# Patient Record
Sex: Female | Born: 1960 | Race: White | Hispanic: No | Smoking: Former smoker
Health system: Southern US, Community
[De-identification: ages and names within clinical notes are randomized; demographics above are authoritative.]

## PROBLEM LIST (undated history)

## (undated) DIAGNOSIS — T7840XA Allergy, unspecified, initial encounter: Secondary | ICD-10-CM

## (undated) DIAGNOSIS — J449 Chronic obstructive pulmonary disease, unspecified: Secondary | ICD-10-CM

## (undated) DIAGNOSIS — I10 Essential (primary) hypertension: Secondary | ICD-10-CM

## (undated) DIAGNOSIS — H409 Unspecified glaucoma: Secondary | ICD-10-CM

## (undated) DIAGNOSIS — N289 Disorder of kidney and ureter, unspecified: Secondary | ICD-10-CM

## (undated) DIAGNOSIS — E079 Disorder of thyroid, unspecified: Secondary | ICD-10-CM

## (undated) DIAGNOSIS — R52 Pain, unspecified: Secondary | ICD-10-CM

## (undated) DIAGNOSIS — E785 Hyperlipidemia, unspecified: Secondary | ICD-10-CM

## (undated) DIAGNOSIS — Z22322 Carrier or suspected carrier of Methicillin resistant Staphylococcus aureus: Secondary | ICD-10-CM

## (undated) HISTORY — DX: Allergy, unspecified, initial encounter: T78.40XA

## (undated) HISTORY — PX: OTHER SURGICAL HISTORY: SHX169

## (undated) HISTORY — PX: CHOLECYSTECTOMY: SHX55

## (undated) HISTORY — DX: Hyperlipidemia, unspecified: E78.5

## (undated) HISTORY — PX: ABDOMINAL HYSTERECTOMY: SHX81

## (undated) HISTORY — DX: Disorder of thyroid, unspecified: E07.9

## (undated) HISTORY — DX: Pain, unspecified: R52

---

## 1992-12-22 HISTORY — PX: OTHER SURGICAL HISTORY: SHX169

## 2001-06-21 ENCOUNTER — Encounter: Admission: RE | Admit: 2001-06-21 | Discharge: 2001-07-08 | Payer: Self-pay | Admitting: Internal Medicine

## 2008-12-12 ENCOUNTER — Ambulatory Visit: Payer: Self-pay | Admitting: Family Medicine

## 2008-12-12 DIAGNOSIS — Z87891 Personal history of nicotine dependence: Secondary | ICD-10-CM | POA: Insufficient documentation

## 2008-12-12 DIAGNOSIS — G589 Mononeuropathy, unspecified: Secondary | ICD-10-CM | POA: Insufficient documentation

## 2008-12-12 DIAGNOSIS — F329 Major depressive disorder, single episode, unspecified: Secondary | ICD-10-CM | POA: Insufficient documentation

## 2008-12-12 DIAGNOSIS — F3289 Other specified depressive episodes: Secondary | ICD-10-CM | POA: Insufficient documentation

## 2008-12-12 DIAGNOSIS — F332 Major depressive disorder, recurrent severe without psychotic features: Secondary | ICD-10-CM | POA: Insufficient documentation

## 2008-12-12 DIAGNOSIS — E782 Mixed hyperlipidemia: Secondary | ICD-10-CM | POA: Insufficient documentation

## 2008-12-12 DIAGNOSIS — F17200 Nicotine dependence, unspecified, uncomplicated: Secondary | ICD-10-CM

## 2008-12-12 DIAGNOSIS — E876 Hypokalemia: Secondary | ICD-10-CM | POA: Insufficient documentation

## 2008-12-12 DIAGNOSIS — J45909 Unspecified asthma, uncomplicated: Secondary | ICD-10-CM | POA: Insufficient documentation

## 2008-12-12 DIAGNOSIS — M48061 Spinal stenosis, lumbar region without neurogenic claudication: Secondary | ICD-10-CM | POA: Insufficient documentation

## 2008-12-12 DIAGNOSIS — F418 Other specified anxiety disorders: Secondary | ICD-10-CM | POA: Insufficient documentation

## 2008-12-12 DIAGNOSIS — E785 Hyperlipidemia, unspecified: Secondary | ICD-10-CM | POA: Insufficient documentation

## 2008-12-12 DIAGNOSIS — J449 Chronic obstructive pulmonary disease, unspecified: Secondary | ICD-10-CM | POA: Insufficient documentation

## 2009-01-12 ENCOUNTER — Ambulatory Visit: Payer: Self-pay | Admitting: Physical Medicine & Rehabilitation

## 2009-01-13 ENCOUNTER — Encounter
Admission: RE | Admit: 2009-01-13 | Discharge: 2009-01-13 | Payer: Self-pay | Admitting: Physical Medicine & Rehabilitation

## 2009-01-17 ENCOUNTER — Encounter
Admission: RE | Admit: 2009-01-17 | Discharge: 2009-02-15 | Payer: Self-pay | Admitting: Physical Medicine & Rehabilitation

## 2009-01-18 ENCOUNTER — Ambulatory Visit: Payer: Self-pay | Admitting: Physical Medicine & Rehabilitation

## 2009-02-05 ENCOUNTER — Encounter: Admission: RE | Admit: 2009-02-05 | Discharge: 2009-02-05 | Payer: Self-pay | Admitting: Family Medicine

## 2009-02-05 ENCOUNTER — Ambulatory Visit: Payer: Self-pay | Admitting: Family Medicine

## 2009-02-05 DIAGNOSIS — M25579 Pain in unspecified ankle and joints of unspecified foot: Secondary | ICD-10-CM | POA: Insufficient documentation

## 2009-02-14 ENCOUNTER — Telehealth: Payer: Self-pay | Admitting: Family Medicine

## 2009-02-15 ENCOUNTER — Ambulatory Visit: Payer: Self-pay | Admitting: Physical Medicine & Rehabilitation

## 2009-02-20 ENCOUNTER — Ambulatory Visit: Payer: Self-pay | Admitting: Family Medicine

## 2009-02-20 DIAGNOSIS — J329 Chronic sinusitis, unspecified: Secondary | ICD-10-CM | POA: Insufficient documentation

## 2009-02-20 DIAGNOSIS — E039 Hypothyroidism, unspecified: Secondary | ICD-10-CM | POA: Insufficient documentation

## 2009-02-21 ENCOUNTER — Telehealth: Payer: Self-pay | Admitting: Family Medicine

## 2009-02-22 LAB — CONVERTED CEMR LAB
Chloride: 97 meq/L (ref 96–112)
Creatinine, Ser: 0.92 mg/dL (ref 0.40–1.20)
LDL Cholesterol: 39 mg/dL (ref 0–99)
Total CHOL/HDL Ratio: 1.8
Total Protein: 6.5 g/dL (ref 6.0–8.3)
Vitamin B-12: 420 pg/mL (ref 211–911)

## 2009-02-26 ENCOUNTER — Encounter: Payer: Self-pay | Admitting: Family Medicine

## 2009-03-05 ENCOUNTER — Telehealth (INDEPENDENT_AMBULATORY_CARE_PROVIDER_SITE_OTHER): Payer: Self-pay | Admitting: *Deleted

## 2009-03-26 ENCOUNTER — Ambulatory Visit: Payer: Self-pay | Admitting: Family Medicine

## 2009-03-26 DIAGNOSIS — I10 Essential (primary) hypertension: Secondary | ICD-10-CM | POA: Insufficient documentation

## 2009-03-26 DIAGNOSIS — R609 Edema, unspecified: Secondary | ICD-10-CM | POA: Insufficient documentation

## 2009-03-27 LAB — CONVERTED CEMR LAB
BUN: 15 mg/dL (ref 6–23)
CO2: 29 meq/L (ref 19–32)
Calcium: 9.5 mg/dL (ref 8.4–10.5)
Chloride: 100 meq/L (ref 96–112)
Creatinine, Ser: 0.99 mg/dL (ref 0.40–1.20)
Glucose, Bld: 123 mg/dL — ABNORMAL HIGH (ref 70–99)
Sodium: 140 meq/L (ref 135–145)

## 2009-04-17 ENCOUNTER — Encounter: Payer: Self-pay | Admitting: Family Medicine

## 2009-05-03 ENCOUNTER — Ambulatory Visit: Payer: Self-pay | Admitting: Family Medicine

## 2009-05-03 LAB — CONVERTED CEMR LAB
Bilirubin Urine: NEGATIVE
Blood in Urine, dipstick: NEGATIVE
Ketones, urine, test strip: NEGATIVE
Nitrite: NEGATIVE
Protein, U semiquant: NEGATIVE
Specific Gravity, Urine: 1.01
Urobilinogen, UA: 0.2
WBC Urine, dipstick: NEGATIVE
pH: 5

## 2009-05-07 ENCOUNTER — Telehealth: Payer: Self-pay | Admitting: Family Medicine

## 2009-05-15 ENCOUNTER — Telehealth: Payer: Self-pay | Admitting: Family Medicine

## 2009-06-01 ENCOUNTER — Encounter: Payer: Self-pay | Admitting: Family Medicine

## 2009-06-22 ENCOUNTER — Ambulatory Visit: Payer: Self-pay | Admitting: Family Medicine

## 2009-07-05 ENCOUNTER — Encounter: Payer: Self-pay | Admitting: Family Medicine

## 2009-07-06 ENCOUNTER — Encounter: Payer: Self-pay | Admitting: Family Medicine

## 2009-07-06 ENCOUNTER — Telehealth: Payer: Self-pay | Admitting: Family Medicine

## 2009-07-13 ENCOUNTER — Ambulatory Visit: Payer: Self-pay | Admitting: Family Medicine

## 2009-07-13 DIAGNOSIS — E559 Vitamin D deficiency, unspecified: Secondary | ICD-10-CM | POA: Insufficient documentation

## 2009-07-14 ENCOUNTER — Encounter: Payer: Self-pay | Admitting: Family Medicine

## 2009-07-16 LAB — CONVERTED CEMR LAB
UIBC: 253 ug/dL
Vit D, 25-Hydroxy: 24 ng/mL — ABNORMAL LOW (ref 30–89)
Vitamin B-12: 310 pg/mL (ref 211–911)

## 2009-07-19 ENCOUNTER — Encounter: Payer: Self-pay | Admitting: Family Medicine

## 2009-08-17 ENCOUNTER — Ambulatory Visit: Payer: Self-pay | Admitting: Family Medicine

## 2009-08-17 DIAGNOSIS — J309 Allergic rhinitis, unspecified: Secondary | ICD-10-CM | POA: Insufficient documentation

## 2009-08-20 ENCOUNTER — Telehealth: Payer: Self-pay | Admitting: Family Medicine

## 2009-08-22 ENCOUNTER — Telehealth: Payer: Self-pay | Admitting: Family Medicine

## 2009-09-06 ENCOUNTER — Ambulatory Visit: Payer: Self-pay | Admitting: Family Medicine

## 2009-09-06 LAB — CONVERTED CEMR LAB: Rapid Strep: NEGATIVE

## 2009-09-10 ENCOUNTER — Telehealth: Payer: Self-pay | Admitting: Family Medicine

## 2009-09-25 ENCOUNTER — Ambulatory Visit: Payer: Self-pay | Admitting: Family Medicine

## 2009-09-25 ENCOUNTER — Encounter: Admission: RE | Admit: 2009-09-25 | Discharge: 2009-09-25 | Payer: Self-pay | Admitting: Family Medicine

## 2009-09-28 ENCOUNTER — Telehealth: Payer: Self-pay | Admitting: Family Medicine

## 2010-01-23 ENCOUNTER — Telehealth: Payer: Self-pay | Admitting: Family Medicine

## 2010-02-14 ENCOUNTER — Encounter: Payer: Self-pay | Admitting: Family Medicine

## 2010-02-14 ENCOUNTER — Telehealth: Payer: Self-pay | Admitting: Family Medicine

## 2010-03-18 ENCOUNTER — Ambulatory Visit: Payer: Self-pay | Admitting: Family Medicine

## 2010-03-28 ENCOUNTER — Encounter: Payer: Self-pay | Admitting: Family Medicine

## 2010-04-30 ENCOUNTER — Encounter: Payer: Self-pay | Admitting: Family Medicine

## 2010-08-29 ENCOUNTER — Ambulatory Visit: Payer: Self-pay | Admitting: Family Medicine

## 2010-08-29 ENCOUNTER — Telehealth: Payer: Self-pay | Admitting: Family Medicine

## 2010-10-14 ENCOUNTER — Ambulatory Visit: Payer: Self-pay | Admitting: Family Medicine

## 2010-10-15 ENCOUNTER — Encounter (INDEPENDENT_AMBULATORY_CARE_PROVIDER_SITE_OTHER): Payer: Self-pay | Admitting: *Deleted

## 2010-10-17 ENCOUNTER — Encounter: Payer: Self-pay | Admitting: Family Medicine

## 2010-10-24 ENCOUNTER — Telehealth: Payer: Self-pay | Admitting: Family Medicine

## 2010-11-01 ENCOUNTER — Encounter: Payer: Self-pay | Admitting: Family Medicine

## 2010-12-17 ENCOUNTER — Ambulatory Visit: Payer: Self-pay | Admitting: Family Medicine

## 2010-12-19 ENCOUNTER — Encounter: Payer: Self-pay | Admitting: Family Medicine

## 2010-12-20 ENCOUNTER — Ambulatory Visit
Admission: RE | Admit: 2010-12-20 | Discharge: 2010-12-20 | Payer: Self-pay | Source: Home / Self Care | Attending: Family Medicine | Admitting: Family Medicine

## 2010-12-20 ENCOUNTER — Telehealth: Payer: Self-pay | Admitting: Family Medicine

## 2010-12-25 ENCOUNTER — Telehealth: Payer: Self-pay | Admitting: Family Medicine

## 2010-12-27 ENCOUNTER — Ambulatory Visit
Admission: RE | Admit: 2010-12-27 | Discharge: 2010-12-27 | Payer: Self-pay | Source: Home / Self Care | Attending: Family Medicine | Admitting: Family Medicine

## 2011-01-04 ENCOUNTER — Encounter: Payer: Self-pay | Admitting: Family Medicine

## 2011-01-07 ENCOUNTER — Ambulatory Visit
Admission: RE | Admit: 2011-01-07 | Discharge: 2011-01-07 | Payer: Self-pay | Source: Home / Self Care | Attending: Family Medicine | Admitting: Family Medicine

## 2011-01-13 ENCOUNTER — Encounter: Payer: Self-pay | Admitting: Family Medicine

## 2011-01-21 NOTE — Progress Notes (Signed)
Summary: refills  Phone Note Call from Patient   Caller: Patient Details for Reason: refill Summary of Call: patient would like a refill of cymbalta Initial call taken by: Kern Reap CMA Duncan Dull),  February 14, 2010 11:20 AM  Follow-up for Phone Call        patient is aware Follow-up by: Kern Reap CMA Duncan Dull),  February 14, 2010 11:35 AM    Prescriptions: CYMBALTA 30 MG CPEP (DULOXETINE HCL) Take 1 tablet by mouth once a day  #30 x 0   Entered by:   Kern Reap CMA (AAMA)   Authorized by:   Nani Gasser MD   Signed by:   Kern Reap CMA (AAMA) on 02/14/2010   Method used:   Electronically to        [facility]* (retail)       [address] [place], [place]  [address]       Ph: [phone]       Fax: [phone]   RxID:   [id number]

## 2011-01-21 NOTE — Progress Notes (Signed)
Summary: cymbalta refill  Phone Note Call from Patient   Caller: Patient Call For: Nani Gasser MD Reason for Call: Refill Medication Summary of Call: patient would like a rx for cymbalta 60mg  is this okay to fill? Initial call taken by: Kern Reap CMA Duncan Dull),  February 14, 2010 12:59 PM  Follow-up for Phone Call        OK to increase to 60mg  but also needs to f/u in the next month.  Follow-up by: Nani Gasser MD,  February 14, 2010 1:05 PM  Additional Follow-up for Phone Call Additional follow up Details #1::        Phone Call Completed Additional Follow-up by: Kern Reap CMA Duncan Dull),  February 14, 2010 2:11 PM    New/Updated Medications: CYMBALTA 60 MG CPEP (DULOXETINE HCL) Take 1 tablet by mouth once a day Prescriptions: CYMBALTA 60 MG CPEP (DULOXETINE HCL) Take 1 tablet by mouth once a day  #30 x 0   Entered and Authorized by:   Nani Gasser MD   Signed by:   Nani Gasser MD on 02/14/2010   Method used:   Electronically to        [facility]* (retail)       [address] [place], [place]  [address]       Ph: [phone]       Fax: [phone]   RxID:   [id number]

## 2011-01-21 NOTE — Progress Notes (Signed)
Summary: Meds  Phone Note Call from Patient Call back at Home Phone [phone]   Summary of Call: pt states insurance changed and now they will cover Cymbalta and Abilify also will cover Omweprazole 2 pills a day. Call to [facility] in [place] Initial call taken by: Kathlene November,  January 23, 2010 3:54 PM  Follow-up for Phone Call        OK, cut citalopram in half for one week then stop and can start the cymbalta. Can stop the seroquel anc change to the abilify.  Follow-up by: Nani Gasser MD,  January 23, 2010 7:42 PM    New/Updated Medications: CYMBALTA 30 MG CPEP (DULOXETINE HCL) Take 1 tablet by mouth once a day OMEPRAZOLE 40 MG CPDR (OMEPRAZOLE) Take 1 tablet by mouth once a day up to two times a day Prescriptions: CYMBALTA 30 MG CPEP (DULOXETINE HCL) Take 1 tablet by mouth once a day  #30 x 0   Entered and Authorized by:   Nani Gasser MD   Signed by:   Nani Gasser MD on 01/23/2010   Method used:   Electronically to        [facility]* (retail)       [address] [place], [place]  [address]       Ph: [phone]       Fax: [phone]   RxID:   [id number] ABILIFY 10 MG TABS (ARIPIPRAZOLE) Take 1 tablet by mouth once a day  #90 x 1   Entered and Authorized by:   Nani Gasser MD   Signed by:   Nani Gasser MD on 01/23/2010   Method used:   Electronically to        [facility]* (retail)       [address] [place], [place]  [address]       Ph: [phone]       Fax: [phone]   RxID:   [id number] OMEPRAZOLE 40 MG CPDR (OMEPRAZOLE) Take 1 tablet by mouth once a day up to two times a day  #60 x 3   Entered and Authorized by:   Nani Gasser MD   Signed by:   Nani Gasser MD on 01/23/2010   Method used:   Electronically to        [facility]* (retail)       [address] [place], [place]  [address]       Ph: [phone]       Fax: [phone]   RxID:   [id number]

## 2011-01-21 NOTE — Assessment & Plan Note (Signed)
Summary: med recheck   Vital Signs:  Patient profile:   50 year old female Height:      63 inches Weight:      232 pounds BMI:     41.25 O2 Sat:      95 % on Room air Pulse rate:   102 / minute BP sitting:   137 / 80  (left arm) Cuff size:   large  Vitals Entered By: Kathlene November (March 18, 2010 9:01 AM)  O2 Flow:  Room air CC: recheck med- Cymbalta- pt states working good but feels worked better in combo with the Abilify but had to stop it due to muscle jerks   Primary Care Provider:  Nani Gasser MD  CC:  recheck med- Cymbalta- pt states working good but feels worked better in combo with the Abilify but had to stop it due to muscle jerks.  History of Present Illness: recheck med- Cymbalta- pt states working good but feels worked better in combo with the Abilify but had to stop it due to muscle jerks.Doesn't feel her mood is well controlled right now and she is not sleeping well. Feels more agitated overall.   Her spring allergies have been bothering her and feels her sinuses are relaly irritated. Still lives with her mother which exposes her to mutliple allergans but unfortunatly the situation won't change for right now.  She is also interested in restarting Chantix.   Current Medications (verified): 1)  Spiriva Handihaler 18 Mcg Caps (Tiotropium Bromide Monohydrate) .... One Inhalation Once A Day 2)  Fexofenadine Hcl 180 Mg Tabs (Fexofenadine Hcl) .Marland Kitchen.. 1 Tab By Mouth Daily 3)  Alprazolam 0.5 Mg Tabs (Alprazolam) .... One Tablet Three Times A Day As Needed 4)  Gabapentin 600 Mg Tabs (Gabapentin) .... One Tablet Once Daily 5)  Zanaflex 4 Mg Tabs (Tizanidine Hcl) .... One Tablet Three Times A Day As Needed 6)  Cymbalta 60 Mg Cpep (Duloxetine Hcl) .... Take 1 Tablet By Mouth Once A Day 7)  Meloxicam 15 Mg Tabs (Meloxicam) .Marland Kitchen.. 1 By Mouth Once Daily 8)  Crestor 20 Mg Tabs (Rosuvastatin Calcium) .... Take One Tablet By Mouth Once A Day 9)  Centrum  Tabs (Multiple  Vitamins-Minerals) 10)  Vitamin B-12 500 Mcg Tabs (Cyanocobalamin) .... One Tablet Two Times A Day 11)  Cvs Iron 325 (65 Fe) Mg Tabs (Ferrous Sulfate) .... One Tablet Two Times A Day 12)  Cvs Vitamin E 400 Unit Caps (Vitamin E) 13)  Aspartate Mg & K 90-90 Mg Caps (Mag Aspart-Potassium Aspart) 14)  Calcium-Magnesium-Vitamin D 200-100-33.3 Mg-Mg-Unit Caps (Calcium-Magnesium-Vitamin D) .... Two Tablets Two Times A Day 15)  Vitamin C Cr 500 Mg Cr-Tabs (Ascorbic Acid) 16)  Zolpidem Tartrate 10 Mg Tabs (Zolpidem Tartrate) .... As Needed 17)  Levothroid 88 Mcg Tabs (Levothyroxine Sodium) .... Take 1 Tablet By Mouth Once A Day 18)  Furosemide 20 Mg Tabs (Furosemide) .... 2 Tabs By Mouth Daily 19)  Methocarbamol 500 Mg Tabs (Methocarbamol) .... Take 1 Tablet By Mouth Three Times A Day 20)  Potassium Chloride Cr 10 Meq Cr-Caps (Potassium Chloride) .... Take 1 Tablet By Mouth Once A Day 21)  Vitamin D 37106 Unit Caps (Ergocalciferol) .... Take 1 Tablet By Mouth Once A Week For 12 Weeks. 22)  Symbicort 160-4.5 Mcg/act Aero (Budesonide-Formoterol Fumarate) .... Use 2 Puffs in The Mornings and in The Evenings 23)  Omeprazole 20 Mg Tbec (Omeprazole) .... Take Oen Tablet By Mouth Twice A Day 24)  Tramadol Hcl 50 Mg  Tabs (Tramadol Hcl) .... One By Mouth Eery 4-6 Hours As Needed As Needed Back Pain 25)  Lisinopril 10 Mg Tabs (Lisinopril) .... Take 1 Tablet By Mouth Once A Day 26)  Proair Hfa 108 (90 Base) Mcg/act Aers (Albuterol Sulfate) .... 2 Puffs Q 4 Hrs Prn  Allergies (verified): 1)  ! Abilify (Aripiprazole) 2)  Prozac (Fluoxetine Hcl) 3)  Paxil (Paroxetine Hcl)  Comments:  Nurse/Medical Assistant: The patient's medications and allergies were reviewed with the patient and were updated in the Medication and Allergy Lists. Kathlene November (March 18, 2010 9:04 AM)  Family History: Reviewed history from 07/13/2009 and no changes required. Brother with DM, Bipolar, heavy smoker, Throat cancer GM with  DM Mother and brothers with depression and mood d/o.   Mother with hi cholesteorl, HTN Father with HTN.   Social History: Brother is Laretta Alstrom.   Unemployed, on disability.  HS degree.  Divorced.  ONe daughter. Lives with her mother currently.   Current Smoker Drug use-no Regular exercise-no  Physical Exam  General:  Well-developed,well-nourished,in no acute distress; alert,appropriate and cooperative throughout examination Lungs:  Normal respiratory effort, chest expands symmetrically. Lungs are clear to auscultation, no crackles or wheezes. Overall dec BS but no wheeze.  Heart:  Normal rate and regular rhythm. S1 and S2 normal without gallop, murmur, click, rub or other extra sounds. Skin:  no rashes.   Psych:  Cognition and judgment appear intact. Alert and cooperative with normal attention span and concentration. No apparent delusions, illusions, hallucinations   Impression & Recommendations:  Problem # 1:  DEPRESSION (ICD-311) Stopped th4 abilify secondary to involuntary muscle movement. Did well on seroqul in the past so will restart this now. We had stopped it becaseu fo cost issues but she has new insurrance this year. Will restart toe 50mg  xR and have her f/u in one month to make sure she is doing well and see if needs  dose adjustement. This iwll likely help with her sleep as well.  Her updated medication list for this problem includes:    Alprazolam 0.5 Mg Tabs (Alprazolam) ..... One tablet three times a day as needed    Cymbalta 60 Mg Cpep (Duloxetine hcl) .Marland Kitchen... Take 1 tablet by mouth once a day  Problem # 2:  FATIGUE (ICD-780.79) Will rule out anmeia. SHe has also not been sleeping well so will refill her Ambien. She thinkis she is on the 10 mg dose.  Will also recheck her B12  and her vitamin D which has  been low in the past.  Orders: T-CBC No Diff ([id number]) T-Vitamin B12 ([id number])  Problem # 3:  ALLERGIC RHINITIS (ICD-477.9) Given sample of astepro to  add her her oral antihistamine and her nasal steoid to see if provide extra wsinus sxs relief.   Her updated medication list for this problem includes:    Fexofenadine Hcl 180 Mg Tabs (Fexofenadine hcl) .Marland Kitchen... 1 tab by mouth daily  Problem # 4:  SMOKER (ICD-305.1) Discussed that once stable on the seeroquel will consider restarting Chantix and follow very closely since this can alter mood.    Complete Medication List: 1)  Spiriva Handihaler 18 Mcg Caps (Tiotropium bromide monohydrate) .... One inhalation once a day 2)  Fexofenadine Hcl 180 Mg Tabs (Fexofenadine hcl) .Marland Kitchen.. 1 tab by mouth daily 3)  Alprazolam 0.5 Mg Tabs (Alprazolam) .... One tablet three times a day as needed 4)  Gabapentin 600 Mg Tabs (Gabapentin) .... One tablet once daily 5)  Zanaflex 4 Mg  Tabs (Tizanidine hcl) .... One tablet three times a day as needed 6)  Cymbalta 60 Mg Cpep (Duloxetine hcl) .... Take 1 tablet by mouth once a day 7)  Meloxicam 15 Mg Tabs (Meloxicam) .Marland Kitchen.. 1 by mouth once daily 8)  Crestor 20 Mg Tabs (Rosuvastatin calcium) .... Take one tablet by mouth once a day 9)  Centrum Tabs (Multiple vitamins-minerals) 10)  Vitamin B-12 500 Mcg Tabs (Cyanocobalamin) .... One tablet two times a day 11)  Cvs Iron 325 (65 Fe) Mg Tabs (Ferrous sulfate) .... One tablet two times a day 12)  Cvs Vitamin E 400 Unit Caps (Vitamin e) 13)  Aspartate Mg & K 90-90 Mg Caps (Mag aspart-potassium aspart) 14)  Calcium-magnesium-vitamin D 200-100-33.3 Mg-mg-unit Caps (Calcium-magnesium-vitamin d) .... Two tablets two times a day 15)  Vitamin C Cr 500 Mg Cr-tabs (Ascorbic acid) 16)  Zolpidem Tartrate 10 Mg Tabs (Zolpidem tartrate) .... Take 1 tablet by mouth once a day at bedtime 17)  Levothroid 88 Mcg Tabs (Levothyroxine sodium) .... Take 1 tablet by mouth once a day 18)  Furosemide 20 Mg Tabs (Furosemide) .... 2 tabs by mouth daily 19)  Methocarbamol 500 Mg Tabs (Methocarbamol) .... Take 1 tablet by mouth three times a day 20)   Potassium Chloride Cr 10 Meq Cr-caps (Potassium chloride) .... Take 1 tablet by mouth once a day 21)  Vitamin D 56433 Unit Caps (Ergocalciferol) .... Take 1 tablet by mouth once a week for 12 weeks. 22)  Symbicort 160-4.5 Mcg/act Aero (Budesonide-formoterol fumarate) .... Use 2 puffs in the mornings and in the evenings 23)  Omeprazole 20 Mg Tbec (Omeprazole) .... Take oen tablet by mouth twice a day 24)  Tramadol Hcl 50 Mg Tabs (Tramadol hcl) .... One by mouth eery 4-6 hours as needed as needed back pain 25)  Lisinopril 10 Mg Tabs (Lisinopril) .... Take 1 tablet by mouth once a day 26)  Proair Hfa 108 (90 Base) Mcg/act Aers (Albuterol sulfate) .... 2 puffs q 4 hrs prn 27)  Seroquel Xr 50 Mg Xr24h-tab (Quetiapine fumarate) .... Take 1 tablet by mouth once a day in the evening.  Other Orders: T-Comprehensive Metabolic Panel [id number]) T-Lipid Profile [id number]) T-TSH [phone]) T-Vitamin D (25-Hydroxy) [id number]) Prescriptions: CRESTOR 20 MG TABS (ROSUVASTATIN CALCIUM) take one tablet by mouth once a day  #30 x 6   Entered and Authorized by:   Nani Gasser MD   Signed by:   Nani Gasser MD on 03/18/2010   Method used:   Electronically to        [facility]* (retail)       [address] [place], [place]  [address]       Ph: [phone]       Fax: [phone]   RxID:   [id number] ZOLPIDEM TARTRATE 10 MG TABS (ZOLPIDEM TARTRATE) Take 1 tablet by mouth once a day at bedtime  #30 x 1   Entered and Authorized by:   Nani Gasser MD   Signed by:   Nani Gasser MD on 03/18/2010   Method used:   Printed then faxed to ...       [facility]* (retail)       [address] [place], [place]  [address]       Ph: [phone]       Fax: [phone]   RxID:   [id number] SEROQUEL XR 50 MG XR24H-TAB (QUETIAPINE FUMARATE) Take 1  tablet by mouth once a day in the evening.  #30 x 2   Entered and Authorized by:   Nani Gasser MD    Signed by:   Nani Gasser MD on 03/18/2010   Method used:   Electronically to        [facility]* (retail)       [address] [place], [place]  [address]       Ph: [phone]       Fax: [phone]   RxID:   [id number]

## 2011-01-21 NOTE — Procedures (Signed)
Summary: Oximetry Order/[facility]  Oximetry Order/[facility]   Imported By: Lanelle Bal 10/25/2010 11:23:07  _____________________________________________________________________  External Attachment:    Type:   Image     Comment:   External Document

## 2011-01-21 NOTE — Assessment & Plan Note (Signed)
Summary: COPD, oxgen, HTN, etc   Vital Signs:  Patient profile:   50 year old female Height:      63 inches Weight:      245 pounds O2 Sat:      92 % on Room air Pulse rate:   94 / minute BP sitting:   141 / 74  (right arm) Cuff size:   large  Vitals Entered By: Avon Gully CMA, Duncan Dull) (October 14, 2010 2:46 PM)  O2 Flow:  Room air CC: Oxygen testing   Primary Care Provider:  Nani Gasser MD  CC:  Oxygen testing.  History of Present Illness: told to use oxygen  for activities and sleep. She is switching companies. She wants to use [employer]. Portable oxygen machine with batteries.    New tubing, etc.  Has a humidifer with nitghttime oxygen.  Ear pain and swelling on thr right side of ear and face. Put ice packs on it. Next day swelled again two times a day.  Ear canal has been hurting on and off since.  Episode occured about 1.5 weeks ago.  Some chronic hearing loss but no changes. No ringing. Restarted her singulair and her sxs have improved grately.    Needs a new pulm referral to [facility] at [facility]. .      Current Medications (verified): 1)  Spiriva Handihaler 18 Mcg Caps (Tiotropium Bromide Monohydrate) .... One Inhalation Once A Day 2)  Alprazolam 0.5 Mg Tabs (Alprazolam) .... One Tablet Three Times A Day As Needed 3)  Gabapentin 600 Mg Tabs (Gabapentin) .... One Tablet Once Daily 4)  Zanaflex 4 Mg Tabs (Tizanidine Hcl) .... One Tablet Three Times A Day As Needed 5)  Cymbalta 60 Mg Cpep (Duloxetine Hcl) .... Take 1 Tablet By Mouth Once A Day 6)  Meloxicam 15 Mg Tabs (Meloxicam) .Marland Kitchen.. 1 By Mouth Once Daily 7)  Crestor 20 Mg Tabs (Rosuvastatin Calcium) .... Take One Tablet By Mouth Once A Day 8)  Centrum  Tabs (Multiple Vitamins-Minerals) 9)  Vitamin B-12 500 Mcg Tabs (Cyanocobalamin) .... One Tablet Two Times A Day 10)  Cvs Iron 325 (65 Fe) Mg Tabs (Ferrous Sulfate) .... One Tablet Two Times A Day 11)  Cvs Vitamin E 400 Unit Caps (Vitamin E) 12)  Aspartate Mg &  K 90-90 Mg Caps (Mag Aspart-Potassium Aspart) 13)  Calcium-Magnesium-Vitamin D 200-100-33.3 Mg-Mg-Unit Caps (Calcium-Magnesium-Vitamin D) .... Two Tablets Two Times A Day 14)  Vitamin C Cr 500 Mg Cr-Tabs (Ascorbic Acid) 15)  Zolpidem Tartrate 10 Mg Tabs (Zolpidem Tartrate) .... Take 1 Tablet By Mouth Once A Day At Bedtime 16)  Levothroid 88 Mcg Tabs (Levothyroxine Sodium) .... Take 1 Tablet By Mouth Once A Day 17)  Furosemide 20 Mg Tabs (Furosemide) .... 2 Tabs By Mouth Daily 18)  Methocarbamol 500 Mg Tabs (Methocarbamol) .... Take 1 Tablet By Mouth Three Times A Day 19)  Potassium Chloride Cr 10 Meq Cr-Caps (Potassium Chloride) .... Take 1 Tablet By Mouth Once A Day 20)  Symbicort 160-4.5 Mcg/act Aero (Budesonide-Formoterol Fumarate) .... Use 2 Puffs in The Mornings and in The Evenings 21)  Omeprazole 20 Mg Tbec (Omeprazole) .... Take Oen Tablet By Mouth Twice A Day 22)  Tramadol Hcl 50 Mg Tabs (Tramadol Hcl) .... One By Mouth Eery 4-6 Hours As Needed As Needed Back Pain 23)  Losartan Potassium 25 Mg Tabs (Losartan Potassium) .... Take 1 Tablet By Mouth Once A Day 24)  Proair Hfa 108 (90 Base) Mcg/act Aers (Albuterol Sulfate) .... 2  Puffs Q 4 Hrs Prn 25)  Seroquel Xr 50 Mg Xr24h-Tab (Quetiapine Fumarate) .... Take 1 Tablet By Mouth Once A Day in The Evening. 26)  Fexofenadine Hcl 30 Mg Tabs (Fexofenadine Hcl) .... 2 Tabs By Mouth Two Times A Day 27)  Ipratropium-Albuterol 0.5-2.5 (3) Mg/39ml Soln (Ipratropium-Albuterol) .... Uese On Vial in A Neb Every 4 Hours As Needed 28)  Chantix 29)  Chantix Continuing Month Pak 1 Mg Tabs (Varenicline Tartrate) .... Take As Directed.  Allergies (verified): 1)  ! Abilify (Aripiprazole) 2)  Prozac (Fluoxetine Hcl) 3)  Paxil (Paroxetine Hcl)  Comments:  Nurse/Medical Assistant: The patient's medications and allergies were reviewed with the patient and were updated in the Medication and Allergy Lists. Avon Gully CMA, Duncan Dull) (October 14, 2010 2:47  PM)  Past History:  Past Medical History: Last updated: 12/12/2008 ENT - Dr. Carolyne Fiscal at [facility].  Pulm - Dr. Enid Skeens at [facility].   Had a normal Sleep study.   Neuro-   Family History: Last updated: 07/13/2009 Brother with DM, Bipolar, heavy smoker, Throat cancer GM with DM Mother and brothers with depression and mood d/o.   Mother with hi cholesteorl, HTN Father with HTN.   Social History: Reviewed history from 03/18/2010 and no changes required. Brother is Laretta Alstrom.   Unemployed, on disability.  HS degree.  Divorced.  ONe daughter. Lives with her mother currently.   Current Smoker Drug use-no Regular exercise-no  Physical Exam  General:  Well-developed,well-nourished,in no acute distress; alert,appropriate and cooperative throughout examination Head:  Normocephalic and atraumatic without obvious abnormalities. No apparent alopecia or balding. Eyes:  No corneal or conjunctival inflammation noted. EOMI. Perrla. Wears glasses.  Ears:  External ear exam shows no significant lesions or deformities.  Otoscopic examination reveals clear canals, tympanic membranes are intact bilaterally without bulging, retraction, inflammation or discharge. Hearing is grossly normal bilaterally. Nose:  External nasal examination shows no deformity or inflammation. Nasal mucosa are pink and moist without lesions or exudates. Mouth:  Oral mucosa and oropharynx without lesions or exudates.  Teeth in good repair. Neck:  No deformities, masses, or tenderness noted. Lungs:  Normal respiratory effort, chest expands symmetrically. Lungs are clear to auscultation, no crackles or wheezes. Heart:  Normal rate and regular rhythm. S1 and S2 normal without gallop, murmur, click, rub or other extra sounds. Skin:  no rashes.   Cervical Nodes:  No lymphadenopathy noted Psych:  Cognition and judgment appear intact. Alert and cooperative with normal attention span and concentration. No apparent delusions,  illusions, hallucinations   Impression & Recommendations:  Problem # 1:  COPD (ICD-496) Will refer to new pulm on her nettwork.  Her oxygen only dropped to 92% so she doesnt' qualify for daytime oxygen. Will call [facility] and have them do an overnight pulse ox as well.  She is dong well on her regimen but continues to smoke.  Gave samples of spiriva. She hasn't been taking consistantly because felt it was making her feel sick but trying to get back on it daily.   Her updated medication list for this problem includes:    Spiriva Handihaler 18 Mcg Caps (Tiotropium bromide monohydrate) ..... One inhalation once a day    Symbicort 160-4.5 Mcg/act Aero (Budesonide-formoterol fumarate) ..... Use 2 puffs in the mornings and in the evenings    Proair Hfa 108 (90 Base) Mcg/act Aers (Albuterol sulfate) .Marland Kitchen... 2 puffs q 4 hrs prn    Ipratropium-albuterol 0.5-2.5 (3) Mg/74ml Soln (Ipratropium-albuterol) ..... Uese on vial in a  neb every 4 hours as needed  Orders: Pulmonary Referral (Pulmonary) Home Health Referral (Home Health)  Problem # 2:  HYPERTENSION, BENIGN (ICD-401.1) Inc losartan to 50mg  daily  F/u to recheck in 1-2 months.  Her updated medication list for this problem includes:    Furosemide 20 Mg Tabs (Furosemide) .Marland Kitchen... 2 tabs by mouth daily    Losartan Potassium 50 Mg Tabs (Losartan potassium) .Marland Kitchen... Take 1 tablet by mouth once a day  BP today: 141/74 Prior BP: 125/81 (08/29/2010)  Prior 10 Yr Risk Heart Disease: 4 % (03/26/2009)  Labs Reviewed: K+: 4.5 (03/26/2009) Creat: : 0.99 (03/26/2009)   Chol: 128 (02/20/2009)   HDL: 71 (02/20/2009)   LDL: 39 (02/20/2009)   TG: 88 (02/20/2009)  Problem # 3:  ALLERGIC RHINITIS (ICD-477.9) I am a little unclear on the cause of the ear swelling but it has resolved on teh singulair. Cont singulari for a couple more weeks. If sweeling resturns then refer to ENT for further consult.  Her updated medication list for this problem includes:     Fexofenadine Hcl 30 Mg Tabs (Fexofenadine hcl) .Marland Kitchen... 2 tabs by mouth two times a day  Complete Medication List: 1)  Spiriva Handihaler 18 Mcg Caps (Tiotropium bromide monohydrate) .... One inhalation once a day 2)  Alprazolam 0.5 Mg Tabs (Alprazolam) .... One tablet three times a day as needed 3)  Gabapentin 600 Mg Tabs (Gabapentin) .... One tablet once daily 4)  Zanaflex 4 Mg Tabs (Tizanidine hcl) .... One tablet three times a day as needed 5)  Cymbalta 60 Mg Cpep (Duloxetine hcl) .... Take 1 tablet by mouth once a day 6)  Meloxicam 15 Mg Tabs (Meloxicam) .Marland Kitchen.. 1 by mouth once daily 7)  Crestor 20 Mg Tabs (Rosuvastatin calcium) .... Take one tablet by mouth once a day 8)  Centrum Tabs (Multiple vitamins-minerals) 9)  Vitamin B-12 500 Mcg Tabs (Cyanocobalamin) .... One tablet two times a day 10)  Cvs Iron 325 (65 Fe) Mg Tabs (Ferrous sulfate) .... One tablet two times a day 11)  Cvs Vitamin E 400 Unit Caps (Vitamin e) 12)  Aspartate Mg & K 90-90 Mg Caps (Mag aspart-potassium aspart) 13)  Calcium-magnesium-vitamin D 200-100-33.3 Mg-mg-unit Caps (Calcium-magnesium-vitamin d) .... Two tablets two times a day 14)  Vitamin C Cr 500 Mg Cr-tabs (Ascorbic acid) 15)  Zolpidem Tartrate 10 Mg Tabs (Zolpidem tartrate) .... Take 1 tablet by mouth once a day at bedtime 16)  Levothroid 88 Mcg Tabs (Levothyroxine sodium) .... Take 1 tablet by mouth once a day 17)  Furosemide 20 Mg Tabs (Furosemide) .... 2 tabs by mouth daily 18)  Methocarbamol 500 Mg Tabs (Methocarbamol) .... Take 1 tablet by mouth three times a day 19)  Potassium Chloride Cr 10 Meq Cr-caps (Potassium chloride) .... Take 1 tablet by mouth once a day 20)  Symbicort 160-4.5 Mcg/act Aero (Budesonide-formoterol fumarate) .... Use 2 puffs in the mornings and in the evenings 21)  Omeprazole 20 Mg Tbec (Omeprazole) .... Take oen tablet by mouth twice a day 22)  Tramadol Hcl 50 Mg Tabs (Tramadol hcl) .... One by mouth eery 4-6 hours as needed as  needed back pain 23)  Losartan Potassium 50 Mg Tabs (Losartan potassium) .... Take 1 tablet by mouth once a day 24)  Proair Hfa 108 (90 Base) Mcg/act Aers (Albuterol sulfate) .... 2 puffs q 4 hrs prn 25)  Seroquel Xr 50 Mg Xr24h-tab (Quetiapine fumarate) .... Take 1 tablet by mouth once a day in the evening. 26)  Fexofenadine Hcl 30 Mg Tabs (Fexofenadine hcl) .... 2 tabs by mouth two times a day 27)  Ipratropium-albuterol 0.5-2.5 (3) Mg/19ml Soln (Ipratropium-albuterol) .... Uese on vial in a neb every 4 hours as needed 28)  Chantix Continuing Month Pak 1 Mg Tabs (Varenicline tartrate) .... Take as directed.  Patient Instructions: 1)  You were given a flu vaccine today.  2)  Increased your losaratan to 50mg  and follow up in 8 weeks 3)  We will call [facility] and have them do an overnight pulse ox to see if qualifies for overnight oxygen.  Prescriptions: LOSARTAN POTASSIUM 50 MG TABS (LOSARTAN POTASSIUM) Take 1 tablet by mouth once a day  #30 x 2   Entered and Authorized by:   Nani Gasser MD   Signed by:   Nani Gasser MD on 10/14/2010   Method used:   Electronically to        [facility]* (retail)       [address] [place], [place]  [address]       Ph: [phone]       Fax: [phone]   RxID:   [id number]    Orders Added: 1)  Pulmonary Referral [Pulmonary] 2)  Est. Patient Level IV [[address]] 3)  Home Health Referral [facility]]

## 2011-01-21 NOTE — Progress Notes (Signed)
Summary: ? duoneb  Phone Note Call from Patient Call back at Home Phone [phone]   Caller: Patient Call For: Nani Gasser MD Summary of Call: Pt calls and states that [facility] was supposed to bring her out Duoneb but hasn't yet.   Sue Lush- I looked in the last OV note and see where they were supposed to do overnight O2 testing on her do you recall faxing anything over about this Initial call taken by: Kathlene November LPN,  October 24, 2010 2:57 PM  Follow-up for Phone Call        I didnt. But it looks like a referral an ov notes were faxed to [facility] on 10/27 by [name].called and [facility] and they have the order and they will be delivering in the next couple of days.called pt and notifed her Follow-up by: Avon Gully CMA, Duncan Dull),  October 24, 2010 4:42 PM

## 2011-01-21 NOTE — Letter (Signed)
Summary: [facility]  [facility]   Imported By: Lanelle Bal 03/04/2010 13:47:26  _____________________________________________________________________  External Attachment:    Type:   Image     Comment:   External Document

## 2011-01-21 NOTE — Letter (Signed)
Summary: [facility]  [facility]   Imported By: Lanelle Bal 06/14/2010 08:30:13  _____________________________________________________________________  External Attachment:    Type:   Image     Comment:   External Document

## 2011-01-21 NOTE — Letter (Signed)
Summary: [facility]  [facility]   Imported By: Lanelle Bal 04/18/2010 10:03:48  _____________________________________________________________________  External Attachment:    Type:   Image     Comment:   External Document

## 2011-01-21 NOTE — Assessment & Plan Note (Signed)
Summary: ASTHMA is worse, smoking   Vital Signs:  Patient profile:   50 year old female Height:      63 inches Weight:      241 pounds O2 Sat:      95 % on Room air Pulse rate:   96 / minute BP sitting:   125 / 81  (right arm) Cuff size:   large  Vitals Entered By: Avon Gully CMA, (AAMA) (August 29, 2010 10:07 AM)  O2 Flow:  Room air CC: cough, congestion, wheezing on and off x 1 month since she stopped taking fexofenadine. was told by pharmacy she couldnt get it anymore   Primary Care Provider:  Nani Gasser MD  CC:  cough, congestion, and wheezing on and off x 1 month since she stopped taking fexofenadine. was told by pharmacy she couldnt get it anymore.  History of Present Illness: cough, congestion, wheezing on and off x 1 month since she stopped taking fexofenadine. was told by pharmacy she couldnt get it anymore. Having cough and congestion. Using her inhaler at night. Will have sxs for a few days and then get better. No fevers. Seh has actually felt better the last couple of days. She is not sure if cold or allergies.  The fexofenadine is on back order.  Seh sayt the OTC allegra doesn't work for her.  She would like refill on her Duoneb vials as well. She has been using her inhalers regularly.  She is also ready to quit smoking and would like to retry the Chantix.   Current Medications (verified): 1)  Spiriva Handihaler 18 Mcg Caps (Tiotropium Bromide Monohydrate) .... One Inhalation Once A Day 2)  Alprazolam 0.5 Mg Tabs (Alprazolam) .... One Tablet Three Times A Day As Needed 3)  Gabapentin 600 Mg Tabs (Gabapentin) .... One Tablet Once Daily 4)  Zanaflex 4 Mg Tabs (Tizanidine Hcl) .... One Tablet Three Times A Day As Needed 5)  Cymbalta 60 Mg Cpep (Duloxetine Hcl) .... Take 1 Tablet By Mouth Once A Day 6)  Meloxicam 15 Mg Tabs (Meloxicam) .Marland Kitchen.. 1 By Mouth Once Daily 7)  Crestor 20 Mg Tabs (Rosuvastatin Calcium) .... Take One Tablet By Mouth Once A Day 8)   Centrum  Tabs (Multiple Vitamins-Minerals) 9)  Vitamin B-12 500 Mcg Tabs (Cyanocobalamin) .... One Tablet Two Times A Day 10)  Cvs Iron 325 (65 Fe) Mg Tabs (Ferrous Sulfate) .... One Tablet Two Times A Day 11)  Cvs Vitamin E 400 Unit Caps (Vitamin E) 12)  Aspartate Mg & K 90-90 Mg Caps (Mag Aspart-Potassium Aspart) 13)  Calcium-Magnesium-Vitamin D 200-100-33.3 Mg-Mg-Unit Caps (Calcium-Magnesium-Vitamin D) .... Two Tablets Two Times A Day 14)  Vitamin C Cr 500 Mg Cr-Tabs (Ascorbic Acid) 15)  Zolpidem Tartrate 10 Mg Tabs (Zolpidem Tartrate) .... Take 1 Tablet By Mouth Once A Day At Bedtime 16)  Levothroid 88 Mcg Tabs (Levothyroxine Sodium) .... Take 1 Tablet By Mouth Once A Day 17)  Furosemide 20 Mg Tabs (Furosemide) .... 2 Tabs By Mouth Daily 18)  Methocarbamol 500 Mg Tabs (Methocarbamol) .... Take 1 Tablet By Mouth Three Times A Day 19)  Potassium Chloride Cr 10 Meq Cr-Caps (Potassium Chloride) .... Take 1 Tablet By Mouth Once A Day 20)  Vitamin D [address] Unit Caps (Ergocalciferol) .... Take 1 Tablet By Mouth Once A Week For 12 Weeks. 21)  Symbicort 160-4.5 Mcg/act Aero (Budesonide-Formoterol Fumarate) .... Use 2 Puffs in The Mornings and in The Evenings 22)  Omeprazole 20 Mg Tbec (Omeprazole) .Marland KitchenMarland KitchenMarland Kitchen  Take Oen Tablet By Mouth Twice A Day 23)  Tramadol Hcl 50 Mg Tabs (Tramadol Hcl) .... One By Mouth Eery 4-6 Hours As Needed As Needed Back Pain 24)  Lisinopril 10 Mg Tabs (Lisinopril) .... Take 1 Tablet By Mouth Once A Day 25)  Proair Hfa 108 (90 Base) Mcg/act Aers (Albuterol Sulfate) .... 2 Puffs Q 4 Hrs Prn 26)  Seroquel Xr 50 Mg Xr24h-Tab (Quetiapine Fumarate) .... Take 1 Tablet By Mouth Once A Day in The Evening.  Allergies (verified): 1)  ! Abilify (Aripiprazole) 2)  Prozac (Fluoxetine Hcl) 3)  Paxil (Paroxetine Hcl)  Comments:  Nurse/Medical Assistant: The patient's medications and allergies were reviewed with the patient and were updated in the Medication and Allergy Lists. Avon Gully CMA, Duncan Dull) (August 29, 2010 10:09 AM)  Past History:  Past Medical History: Last updated: 12/12/2008 ENT - Dr. Carolyne Fiscal at [facility].  Pulm - Dr. Enid Skeens at [facility].   Had a normal Sleep study.   Neuro-   Family History: Last updated: 07/13/2009 Brother with DM, Bipolar, heavy smoker, Throat cancer GM with DM Mother and brothers with depression and mood d/o.   Mother with hi cholesteorl, HTN Father with HTN.   Physical Exam  General:  Well-developed,well-nourished,in no acute distress; alert,appropriate and cooperative throughout examination Head:  Normocephalic and atraumatic without obvious abnormalities. No apparent alopecia or balding. Eyes:  No corneal or conjunctival inflammation noted. EOMI. Perrla.  Ears:  External ear exam shows no significant lesions or deformities.  Otoscopic examination reveals clear canals, tympanic membranes are intact bilaterally without bulging, retraction, inflammation or discharge. Hearing is grossly normal bilaterally. Nose:  External nasal examination shows no deformity or inflammation. Nasal mucosa are pink and moist without lesions or exudates. Mouth:  Oral mucosa and oropharynx without lesions or exudates.  Teeth in good repair. Neck:  No deformities, masses, or tenderness noted. Lungs:  Normal respiratory effort, chest expands symmetrically. Lungs are clear to auscultation, no crackles or wheezes, except wheeze over the right anterior lung field.  Heart:  Normal rate and regular rhythm. S1 and S2 normal without gallop, murmur, click, rub or other extra sounds. Skin:  no rashes.   Cervical Nodes:  No lymphadenopathy noted Psych:  Cognition and judgment appear intact. Alert and cooperative with normal attention span and concentration. No apparent delusions, illusions, hallucinations   Impression & Recommendations:  Problem # 1:  ASTHMA, INTRINSIC (ICD-493.10) Assessment Deteriorated She has been ok the last few days so I really  don't think we need to use steroids but will add singulair to her regimen. Samples given.  If helpig will send in a rx. She is also a good candidate for allergy infjections but she can't afford these at this time.  I am not sure why the OTC Allegra doesn't seem to help her ( she feels only the rx version works) but can change her to xyzal instead.   Her updated medication list for this problem includes:    Spiriva Handihaler 18 Mcg Caps (Tiotropium bromide monohydrate) ..... One inhalation once a day    Symbicort 160-4.5 Mcg/act Aero (Budesonide-formoterol fumarate) ..... Use 2 puffs in the mornings and in the evenings    Proair Hfa 108 (90 Base) Mcg/act Aers (Albuterol sulfate) .Marland Kitchen... 2 puffs q 4 hrs prn    Ipratropium-albuterol 0.5-2.5 (3) Mg/57ml Soln (Ipratropium-albuterol) ..... Uese on vial in a neb every 4 hours as needed  Problem # 2:  SMOKER (ICD-305.1)  She is ready to quit  and would like to start chantix. We have discussed this before. Rx sent. Monitor for any mood changes, call immediately if happens.   Will have her f/u in one month to see how she is doing.  Her updated medication list for this problem includes:    Chantix Starting Month Pak 0.5 Mg X 11 & 1 Mg X 42 Tabs (Varenicline tartrate) .Marland Kitchen... Take as directed.  Orders: Prescription Created Electronically [phone])  Complete Medication List: 1)  Spiriva Handihaler 18 Mcg Caps (Tiotropium bromide monohydrate) .... One inhalation once a day 2)  Alprazolam 0.5 Mg Tabs (Alprazolam) .... One tablet three times a day as needed 3)  Gabapentin 600 Mg Tabs (Gabapentin) .... One tablet once daily 4)  Zanaflex 4 Mg Tabs (Tizanidine hcl) .... One tablet three times a day as needed 5)  Cymbalta 60 Mg Cpep (Duloxetine hcl) .... Take 1 tablet by mouth once a day 6)  Meloxicam 15 Mg Tabs (Meloxicam) .Marland Kitchen.. 1 by mouth once daily 7)  Crestor 20 Mg Tabs (Rosuvastatin calcium) .... Take one tablet by mouth once a day 8)  Centrum Tabs (Multiple  vitamins-minerals) 9)  Vitamin B-12 500 Mcg Tabs (Cyanocobalamin) .... One tablet two times a day 10)  Cvs Iron 325 (65 Fe) Mg Tabs (Ferrous sulfate) .... One tablet two times a day 11)  Cvs Vitamin E 400 Unit Caps (Vitamin e) 12)  Aspartate Mg & K 90-90 Mg Caps (Mag aspart-potassium aspart) 13)  Calcium-magnesium-vitamin D 200-100-33.3 Mg-mg-unit Caps (Calcium-magnesium-vitamin d) .... Two tablets two times a day 14)  Vitamin C Cr 500 Mg Cr-tabs (Ascorbic acid) 15)  Zolpidem Tartrate 10 Mg Tabs (Zolpidem tartrate) .... Take 1 tablet by mouth once a day at bedtime 16)  Levothroid 88 Mcg Tabs (Levothyroxine sodium) .... Take 1 tablet by mouth once a day 17)  Furosemide 20 Mg Tabs (Furosemide) .... 2 tabs by mouth daily 18)  Methocarbamol 500 Mg Tabs (Methocarbamol) .... Take 1 tablet by mouth three times a day 19)  Potassium Chloride Cr 10 Meq Cr-caps (Potassium chloride) .... Take 1 tablet by mouth once a day 20)  Symbicort 160-4.5 Mcg/act Aero (Budesonide-formoterol fumarate) .... Use 2 puffs in the mornings and in the evenings 21)  Omeprazole 20 Mg Tbec (Omeprazole) .... Take oen tablet by mouth twice a day 22)  Tramadol Hcl 50 Mg Tabs (Tramadol hcl) .... One by mouth eery 4-6 hours as needed as needed back pain 23)  Losartan Potassium 25 Mg Tabs (Losartan potassium) .... Take 1 tablet by mouth once a day 24)  Proair Hfa 108 (90 Base) Mcg/act Aers (Albuterol sulfate) .... 2 puffs q 4 hrs prn 25)  Seroquel Xr 50 Mg Xr24h-tab (Quetiapine fumarate) .... Take 1 tablet by mouth once a day in the evening. 26)  Xyzal 5 Mg Tabs (Levocetirizine dihydrochloride) .... Take 1 tablet by mouth once a day 27)  Ipratropium-albuterol 0.5-2.5 (3) Mg/74ml Soln (Ipratropium-albuterol) .... Uese on vial in a neb every 4 hours as needed 28)  Chantix  29)  Chantix Starting Month Pak 0.5 Mg X 11 & 1 Mg X 42 Tabs (Varenicline tartrate) .... Take as directed.  Patient Instructions: 1)  Add singulair once a day to  regimen. 2)  Stop the lisinopril and start the losartan instead.  3)  Call if not better in 2 weeks.  4)  Can start the chantix. If noticing any mood change then let me know.  5)  Please schedule a follow-up appointment in 1 month.  Prescriptions:  LOSARTAN POTASSIUM 25 MG TABS (LOSARTAN POTASSIUM) Take 1 tablet by mouth once a day  #30 x 1   Entered and Authorized by:   Nani Gasser MD   Signed by:   Nani Gasser MD on 08/29/2010   Method used:   Electronically to        [facility]* (retail)       [address] [place], [place]  [address]       Ph: [phone]       Fax: [phone]   RxID:   [id number] CHANTIX STARTING MONTH PAK 0.5 MG X 11 & 1 MG X 42 TABS (VARENICLINE TARTRATE) Take as directed.  #1 pack x 0   Entered and Authorized by:   Nani Gasser MD   Signed by:   Nani Gasser MD on 08/29/2010   Method used:   Electronically to        [facility]* (retail)       [address] [place], [place]  [address]       Ph: [phone]       Fax: [phone]   RxID:   [id number] IPRATROPIUM-ALBUTEROL 0.5-2.5 (3) MG/3ML SOLN (IPRATROPIUM-ALBUTEROL) Uese on vial in a NEB every 4 hours as needed  #90 day sup x 0   Entered and Authorized by:   Nani Gasser MD   Signed by:   Nani Gasser MD on 08/29/2010   Method used:   Printed then faxed to ...       [facility]* (retail)       [address] [place], [place]  [address]       Ph: [phone]       Fax: [phone]   RxID:   [id number] XYZAL 5 MG TABS (LEVOCETIRIZINE DIHYDROCHLORIDE) Take 1 tablet by mouth once a day  #30 x 1   Entered and Authorized by:   Nani Gasser MD   Signed by:   Nani Gasser MD on 08/29/2010   Method used:   Electronically to        [facility]* (retail)       [address] [place], [place]  [address]       Ph: [phone]       Fax: [phone]   RxID:   [id number]

## 2011-01-21 NOTE — Letter (Signed)
Summary: Primary Care Consult Scheduled Letter  [facility]  [address], [address]   [place], [place] [address]   Phone: [phone]  Fax: [phone]      10/15/2010 MRN: [id number]  Krystal Burgess [address] [place], [place]  [address]    Dear [name],     We have scheduled an appointment for you.  At the recommendation of Dr.Metheney, we have scheduled you a consult with Dr. Lynett Fish on 12/04/10, Wednesday at 1:00.  Their located at [facility] on the 7th floor of the Clinical Science Building. The office phone number is [phone].  If this appointment day and time is not convenient for you, please feel free to call the office of the doctor you are being referred to at the number listed above and reschedule the appointment.     It is important for you to keep your scheduled appointments. We are here to make sure you are given good patient care.    Thank you, [name] [phone] Patient Care Coordinator [facility] [place]

## 2011-01-21 NOTE — Letter (Signed)
Summary: [facility] for Oxygen/[employer]  [facility]N for [facility]   Imported By: Lanelle Bal 11/12/2010 13:11:26  _____________________________________________________________________  External Attachment:    Type:   Image     Comment:   External Document

## 2011-01-21 NOTE — Progress Notes (Signed)
Summary: meds  Phone Note Call from Patient   Summary of Call: insurance will not cover xyzal but will cover 30 mg of fexofenadine. Initial call taken by: Avon Gully CMA, Duncan Dull),  August 29, 2010 11:34 AM  Follow-up for Phone Call        OK changed. xyzal not covered by insurance either.  Follow-up by: Nani Gasser MD,  August 29, 2010 12:37 PM    New/Updated Medications: FEXOFENADINE HCL 30 MG TABS (FEXOFENADINE HCL) 2 tabs by mouth two times a day Prescriptions: FEXOFENADINE HCL 30 MG TABS (FEXOFENADINE HCL) 2 tabs by mouth two times a day  #120 x 2   Entered and Authorized by:   Nani Gasser MD   Signed by:   Nani Gasser MD on 08/29/2010   Method used:   Electronically to        [facility]* (retail)       [address] [place], [place]  [address]       Ph: [phone]       Fax: [phone]   RxID:   [id number]

## 2011-01-23 NOTE — Progress Notes (Signed)
Summary: Med for neb machine  Phone Note Call from Patient Call back at Home Phone [phone]   Caller: Patient Call For: Nani Gasser MD Summary of Call: Pt calls and states the med you sent in for her to use in her neb machine has to be mixed with a saline solution and she doesn't have the saline. Says she usually uses the pre-made med for the neb machine. Wanted to know if you would call in the premade or need to send in the saline solution for her to add to it Initial call taken by: Kathlene November LPN,  December 25, 2010 12:50 PM  Follow-up for Phone Call        Is this the combivent or albuterol? We may need to call the pharm and ask because in the computer it doesn't tell me if it has to be diluted or not. We can aske the pharmacist and then call it in.  Follow-up by: Nani Gasser MD,  December 25, 2010 12:59 PM  Additional Follow-up for Phone Call Additional follow up Details #1::        called pharm and they did not have neither combivent or albuterol on rec bcause she hasnt used this pharm in the past.pharmascist states pt said she was told to use ipatropium which she has at home and this has to be diluted with saline Additional Follow-up by: Avon Gully CMA, Duncan Dull),  December 25, 2010 2:21 PM    Additional Follow-up for Phone Call Additional follow up Details #2::    Ok to call in saline vials then. Call pt and let her knwlow we can call in Saline vials but need to make sure she has albuterol because that is her true rescue med.  Follow-up by: Nani Gasser MD,  December 26, 2010 7:56 AM  Additional Follow-up for Phone Call Additional follow up Details #3:: Details for Additional Follow-up Action Taken: this has been done  Additional Follow-up by: Avon Gully CMA, Duncan Dull),  December 27, 2010 10:08 AM

## 2011-01-23 NOTE — Letter (Signed)
Summary: [facility]  [facility]   Imported By: Lanelle Bal 01/16/2011 09:18:41  _____________________________________________________________________  External Attachment:    Type:   Image     Comment:   External Document

## 2011-01-23 NOTE — Assessment & Plan Note (Signed)
Summary: f/u asthma exacerbation   Vital Signs:  Patient profile:   50 year old female Height:      63 inches Weight:      253 pounds O2 Sat:      96 % on Room air Pulse rate:   80 / minute BP sitting:   157 / 79  (right arm) Cuff size:   large  Vitals Entered By: Avon Gully CMA, Duncan Dull) (December 27, 2010 3:40 PM)  O2 Flow:  Room air CC: f/u asthma   Primary Care Provider:  Nani Gasser MD  CC:  f/u asthma.  History of Present Illness: Overall she is feeling much better. She s on the steroids nad antibiotic. Saysstillusing her nebs but tremendously better. Still wheezing occ.  Still around lots of second hand smoke. She does need some saline vials to mix with her albuerol which she says is powdered  Says her mood is baout the same. She is living with her mother and says ehr mother has been off her mood meds for 3 months and this has created alot of friction between then. She is tolerating the inc dose of Seroquel XR.   Current Medications (verified): 1)  Spiriva Handihaler 18 Mcg Caps (Tiotropium Bromide Monohydrate) .... One Inhalation Once A Day 2)  Alprazolam 0.5 Mg Tabs (Alprazolam) .... One Tablet Three Times A Day As Needed 3)  Gabapentin 600 Mg Tabs (Gabapentin) .... One Tablet Once Daily 4)  Zanaflex 4 Mg Tabs (Tizanidine Hcl) .... One Tablet Three Times A Day As Needed 5)  Cymbalta 60 Mg Cpep (Duloxetine Hcl) .... Take 1 Tablet By Mouth Once A Day 6)  Meloxicam 15 Mg Tabs (Meloxicam) .Marland Kitchen.. 1 By Mouth Once Daily 7)  Crestor 20 Mg Tabs (Rosuvastatin Calcium) .... Take One Tablet By Mouth Once A Day 8)  Centrum  Tabs (Multiple Vitamins-Minerals) 9)  Vitamin B-12 500 Mcg Tabs (Cyanocobalamin) .... One Tablet Two Times A Day 10)  Cvs Iron 325 (65 Fe) Mg Tabs (Ferrous Sulfate) .... One Tablet Two Times A Day 11)  Cvs Vitamin E 400 Unit Caps (Vitamin E) 12)  Aspartate Mg & K 90-90 Mg Caps (Mag Aspart-Potassium Aspart) 13)  Calcium-Magnesium-Vitamin D 200-100-33.3  Mg-Mg-Unit Caps (Calcium-Magnesium-Vitamin D) .... Two Tablets Two Times A Day 14)  Vitamin C Cr 500 Mg Cr-Tabs (Ascorbic Acid) 15)  Zolpidem Tartrate 10 Mg Tabs (Zolpidem Tartrate) .... Take 1 Tablet By Mouth Once A Day At Bedtime 16)  Levothroid 88 Mcg Tabs (Levothyroxine Sodium) .... Take 1 Tablet By Mouth Once A Day 17)  Furosemide 20 Mg Tabs (Furosemide) .... 2 Tabs By Mouth Daily 18)  Methocarbamol 500 Mg Tabs (Methocarbamol) .... Take 1 Tablet By Mouth Three Times A Day 19)  Potassium Chloride Cr 10 Meq Cr-Caps (Potassium Chloride) .... Take 1 Tablet By Mouth Once A Day 20)  Symbicort 160-4.5 Mcg/act Aero (Budesonide-Formoterol Fumarate) .... Use 2 Puffs in The Mornings and in The Evenings 21)  Omeprazole 20 Mg Tbec (Omeprazole) .... Take Oen Tablet By Mouth Twice A Day 22)  Tramadol Hcl 50 Mg Tabs (Tramadol Hcl) .... One By Mouth Eery 4-6 Hours As Needed As Needed Back Pain 23)  Losartan Potassium 50 Mg Tabs (Losartan Potassium) .... Take 1 Tablet By Mouth Once A Day 24)  Proair Hfa 108 (90 Base) Mcg/act Aers (Albuterol Sulfate) .... 2 Puffs Q 4 Hrs Prn 25)  Seroquel Xr 150 Mg Xr24h-Tab (Quetiapine Fumarate) .... Take 1 Tablet By Mouth Once A Day 26)  Xyzal 5 Mg Tabs (Levocetirizine Dihydrochloride) .... Take 1 Tablet By Mouth Once A Day 27)  Ipratropium-Albuterol 0.5-2.5 (3) Mg/1ml Soln (Ipratropium-Albuterol) .... Uese On Vial in A Neb Every 4 Hours As Needed 28)  Chantix Continuing Month Pak 1 Mg Tabs (Varenicline Tartrate) .... Take As Directed. 29)  Doxycycline Hyclate 100 Mg Tabs (Doxycycline Hyclate) .... Take 1 Tablet By Mouth Two Times A Day For 10 Days 30)  Prednisone 20 Mg Tabs (Prednisone) .... 2 Tabs Daily For One Week, Then Drop To 1 Tab Daily For One Week, Then 1/2 Tab Daily For One Week. 31)  Albuterol Sulfate (5 Mg/ml) 0.5% Nebu (Albuterol Sulfate) .... Neb Up To Three Times A Day 32)  Fluticasone Propionate 50 Mcg/act Susp (Fluticasone Propionate) .... 2 Sprays in Each  Nostril Daily  Allergies (verified): 1)  ! Abilify (Aripiprazole) 2)  Prozac (Fluoxetine Hcl) 3)  Paxil (Paroxetine Hcl)  Comments:  Nurse/Medical Assistant: The patient's medications and allergies were reviewed with the patient and were updated in the Medication and Allergy Lists. Avon Gully CMA, Duncan Dull) (December 27, 2010 3:42 PM)  Physical Exam  General:  Well-developed,well-nourished,in no acute distress; alert,appropriate and cooperative throughout examination Head:  Normocephalic and atraumatic without obvious abnormalities. No apparent alopecia or balding. Ears:  External ear exam shows no significant lesions or deformities.  Otoscopic examination reveals clear canals, tympanic membranes are intact bilaterally without bulging, retraction, inflammation or discharge. Hearing is grossly normal bilaterally. Nose:  External nasal examination shows no deformity or inflammation. Mouth:  Oral mucosa and oropharynx without lesions or exudates.  Teeth in good repair. Neck:  No deformities, masses, or tenderness noted. Lungs:  Normal respiratory effort, chest expands symmetrically.Diffuse expiratory wheeze.  Heart:  Normal rate and regular rhythm. S1 and S2 normal without gallop, murmur, click, rub or other extra sounds. Skin:  no rashes.   Psych:  Cognition and judgment appear intact. Alert and cooperative with normal attention span and concentration. No apparent delusions, illusions, hallucinations   Impression & Recommendations:  Problem # 1:  ASTHMA, INTRINSIC (ICD-493.10) Much improved. She still has some wheezing so asked her to continue her steroids and use her Combivent QID Call if not continuing to improve.  Her updated medication list for this problem includes:    Spiriva Handihaler 18 Mcg Caps (Tiotropium bromide monohydrate) ..... One inhalation once a day    Symbicort 160-4.5 Mcg/act Aero (Budesonide-formoterol fumarate) ..... Use 2 puffs in the mornings and in the  evenings    Proair Hfa 108 (90 Base) Mcg/act Aers (Albuterol sulfate) .Marland Kitchen... 2 puffs q 4 hrs prn    Ipratropium-albuterol 0.5-2.5 (3) Mg/2ml Soln (Ipratropium-albuterol) ..... Uese on vial in a neb every 4 hours as needed    Prednisone 20 Mg Tabs (Prednisone) .Marland Kitchen... 2 tabs daily for one week, then drop to 1 tab daily for one week, then 1/2 tab daily for one week.    Albuterol Sulfate (5 Mg/ml) 0.5% Nebu (Albuterol sulfate) ..... Neb up to three times a day  Complete Medication List: 1)  Spiriva Handihaler 18 Mcg Caps (Tiotropium bromide monohydrate) .... One inhalation once a day 2)  Alprazolam 0.5 Mg Tabs (Alprazolam) .... One tablet three times a day as needed 3)  Gabapentin 600 Mg Tabs (Gabapentin) .... One tablet once daily 4)  Zanaflex 4 Mg Tabs (Tizanidine hcl) .... One tablet three times a day as needed 5)  Cymbalta 60 Mg Cpep (Duloxetine hcl) .... Take 1 tablet by mouth once a day 6)  Meloxicam 15 Mg Tabs (Meloxicam) .Marland Kitchen.. 1 by mouth once daily 7)  Crestor 20 Mg Tabs (Rosuvastatin calcium) .... Take one tablet by mouth once a day 8)  Centrum Tabs (Multiple vitamins-minerals) 9)  Vitamin B-12 500 Mcg Tabs (Cyanocobalamin) .... One tablet two times a day 10)  Cvs Iron 325 (65 Fe) Mg Tabs (Ferrous sulfate) .... One tablet two times a day 11)  Cvs Vitamin E 400 Unit Caps (Vitamin e) 12)  Aspartate Mg & K 90-90 Mg Caps (Mag aspart-potassium aspart) 13)  Calcium-magnesium-vitamin D 200-100-33.3 Mg-mg-unit Caps (Calcium-magnesium-vitamin d) .... Two tablets two times a day 14)  Vitamin C Cr 500 Mg Cr-tabs (Ascorbic acid) 15)  Zolpidem Tartrate 10 Mg Tabs (Zolpidem tartrate) .... Take 1 tablet by mouth once a day at bedtime 16)  Levothroid 88 Mcg Tabs (Levothyroxine sodium) .... Take 1 tablet by mouth once a day 17)  Furosemide 20 Mg Tabs (Furosemide) .... 2 tabs by mouth daily 18)  Methocarbamol 500 Mg Tabs (Methocarbamol) .... Take 1 tablet by mouth three times a day 19)  Potassium Chloride  Cr 10 Meq Cr-caps (Potassium chloride) .... Take 1 tablet by mouth once a day 20)  Symbicort 160-4.5 Mcg/act Aero (Budesonide-formoterol fumarate) .... Use 2 puffs in the mornings and in the evenings 21)  Omeprazole 20 Mg Tbec (Omeprazole) .... Take oen tablet by mouth twice a day 22)  Tramadol Hcl 50 Mg Tabs (Tramadol hcl) .... One by mouth eery 4-6 hours as needed as needed back pain 23)  Losartan Potassium 50 Mg Tabs (Losartan potassium) .... Take 1 tablet by mouth once a day 24)  Proair Hfa 108 (90 Base) Mcg/act Aers (Albuterol sulfate) .... 2 puffs q 4 hrs prn 25)  Seroquel Xr 150 Mg Xr24h-tab (Quetiapine fumarate) .... Take 1 tablet by mouth once a day 26)  Xyzal 5 Mg Tabs (Levocetirizine dihydrochloride) .... Take 1 tablet by mouth once a day 27)  Ipratropium-albuterol 0.5-2.5 (3) Mg/32ml Soln (Ipratropium-albuterol) .... Uese on vial in a neb every 4 hours as needed 28)  Chantix Continuing Month Pak 1 Mg Tabs (Varenicline tartrate) .... Take as directed. 29)  Doxycycline Hyclate 100 Mg Tabs (Doxycycline hyclate) .... Take 1 tablet by mouth two times a day for 10 days 30)  Prednisone 20 Mg Tabs (Prednisone) .... 2 tabs daily for one week, then drop to 1 tab daily for one week, then 1/2 tab daily for one week. 31)  Albuterol Sulfate (5 Mg/ml) 0.5% Nebu (Albuterol sulfate) .... Neb up to three times a day 32)  Fluticasone Propionate 50 Mcg/act Susp (Fluticasone propionate) .... 2 sprays in each nostril daily   Orders Added: 1)  Est. Patient Level II [16109]

## 2011-01-23 NOTE — Assessment & Plan Note (Signed)
Summary: asthma, sinusitis, edema   Vital Signs:  Patient profile:   50 year old female Height:      63 inches Weight:      229 pounds O2 Sat:      96 % on Room air Pulse rate:   113 / minute BP sitting:   185 / 89  (right arm) Cuff size:   large  Vitals Entered By: Avon Gully CMA, (AAMA) (December 20, 2010 4:09 PM)  O2 Flow:  Room air CC: wheezing for a week   Serial Vital Signs/Assessments:  Comments: 4:11 PM peak flow 240,240,220 pt is in the yellow zone By: Avon Gully CMA, Duncan Dull)    Primary Care Provider:  Nani Gasser MD  CC:  wheezing for a week.  History of Present Illness: Has been wheezing for one week. Last used her NEB aabout 1.5 hours ago.  Nasal congestion for one week.  + HA. NO GI sxs.  No ST. Coughing fits some dry, some productive.  Has used her nebs so many time today not sure how many she has really used.   Occ passes out when coughs.    Allergies: 1)  ! Abilify (Aripiprazole) 2)  Prozac (Fluoxetine Hcl) 3)  Paxil (Paroxetine Hcl)  Physical Exam  General:  Well-developed,well-nourished,in no acute distress; alert,appropriate and cooperative throughout examination Head:  Normocephalic and atraumatic without obvious abnormalities. No apparent alopecia or balding. Eyes:  No corneal or conjunctival inflammation noted. EOMI. Perrla. wears glasses Ears:  External ear exam shows no significant lesions or deformities.  Otoscopic examination reveals clear canals, tympanic membranes are intact bilaterally without bulging, retraction, inflammation or discharge. Hearing is grossly normal bilaterally. Nose:  External nasal examination shows no deformity or inflammation. Nasal mucosa are pink and moist without lesions or exudates. Mouth:  Oral mucosa and oropharynx without lesions or exudates. Neck:  No deformities, masses, or tenderness noted. Lungs:  decreased air movement bilaterally with good effort.  Fine wheeze expiratory wheezing  diffusely.  No rhonchi or crackles Heart:  Normal rate and regular rhythm. S1 and S2 normal without gallop, murmur, click, rub or other extra sounds. Pulses:  radial pulse 2+ bilaterally Extremities:  trace ankle edema bilaterally Skin:  no rashes.   Cervical Nodes:  No lymphadenopathy noted Psych:  Cognition and judgment appear intact. Alert and cooperative with normal attention span and concentration. No apparent delusions, illusions, hallucinations   Impression & Recommendations:  Problem # 1:  ASTHMA, INTRINSIC (ICD-493.10)  she is clearly having a COPD exacerbation.  With her sinus symptoms it is unclear if this is more allergic related or related or if she truly has sinusitis.  At this point she is using her albuterol in large amounts and I'm concerned about her.  will treat her with doxycycline.  She had a 57-mg of-year-old center in the office in increased significantly.  The airflow was much improved on her exam she still had some wheezing.  She was able to speak without feeling short of breath.  Patient had a noticeable improvement.  I did refill her some court which she says she is on incentive her prescription for albuterol vials and she is using her Combivent vials a little too much.also given a steroid injection today.  She has a prescription for the steroid taper over the next 3 weeks. Her updated medication list for this problem includes:    Spiriva Handihaler 18 Mcg Caps (Tiotropium bromide monohydrate) ..... One inhalation once a day    Symbicort 160-4.5  Mcg/act Aero (Budesonide-formoterol fumarate) ..... Use 2 puffs in the mornings and in the evenings    Proair Hfa 108 (90 Base) Mcg/act Aers (Albuterol sulfate) .Marland Kitchen... 2 puffs q 4 hrs prn    Ipratropium-albuterol 0.5-2.5 (3) Mg/19ml Soln (Ipratropium-albuterol) ..... Uese on vial in a neb every 4 hours as needed    Prednisone 20 Mg Tabs (Prednisone) .Marland Kitchen... 2 tabs daily for one week, then drop to 1 tab daily for one week, then 1/2 tab  daily for one week.    Albuterol Sulfate (5 Mg/ml) 0.5% Nebu (Albuterol sulfate) ..... Neb up to three times a day  Orders: Depo- Medrol 80mg  (J1040) Admin of Therapeutic Inj  intramuscular or subcutaneous (81191) Home Health Referral (Home Health) Albuterol Sulfate Sol 1mg  unit dose (Y7829) Peak Flow Rate (94150) Peak Flow Meter (F6213) Nebulizer Tx (08657)  Problem # 2:  SINUSITIS (ICD-473.9)  Her updated medication list for this problem includes:    Doxycycline Hyclate 100 Mg Tabs (Doxycycline hyclate) .Marland Kitchen... Take 1 tablet by mouth two times a day for 10 days    Fluticasone Propionate 50 Mcg/act Susp (Fluticasone propionate) .Marland Kitchen... 2 sprays in each nostril daily  Take antibiotics for full duration.  there's still likely an allergic component to this.  She does have severe allergies.  We will restart Flonase which she has been on in the past.  Also she says the Joyce Copa is not working as well as it used to and would like to try something else.  We will try changing her to zyxal  Problem # 3:  LEG EDEMA, BILATERAL (ICD-782.3) she does have trace ankle edema today.  I let her know that she can take 3 tabs daily for approximately 2 days.  She is to avoid salt as much as possible decrease her soda intake, and keep her feet elevated.  I really doubt her to do more than this and ever dry her kidneys. Her updated medication list for this problem includes:    Furosemide 20 Mg Tabs (Furosemide) .Marland Kitchen... 2 tabs by mouth daily  Complete Medication List: 1)  Spiriva Handihaler 18 Mcg Caps (Tiotropium bromide monohydrate) .... One inhalation once a day 2)  Alprazolam 0.5 Mg Tabs (Alprazolam) .... One tablet three times a day as needed 3)  Gabapentin 600 Mg Tabs (Gabapentin) .... One tablet once daily 4)  Zanaflex 4 Mg Tabs (Tizanidine hcl) .... One tablet three times a day as needed 5)  Cymbalta 60 Mg Cpep (Duloxetine hcl) .... Take 1 tablet by mouth once a day 6)  Meloxicam 15 Mg Tabs (Meloxicam) .Marland Kitchen..  1 by mouth once daily 7)  Crestor 20 Mg Tabs (Rosuvastatin calcium) .... Take one tablet by mouth once a day 8)  Centrum Tabs (Multiple vitamins-minerals) 9)  Vitamin B-12 500 Mcg Tabs (Cyanocobalamin) .... One tablet two times a day 10)  Cvs Iron 325 (65 Fe) Mg Tabs (Ferrous sulfate) .... One tablet two times a day 11)  Cvs Vitamin E 400 Unit Caps (Vitamin e) 12)  Aspartate Mg & K 90-90 Mg Caps (Mag aspart-potassium aspart) 13)  Calcium-magnesium-vitamin D 200-100-33.3 Mg-mg-unit Caps (Calcium-magnesium-vitamin d) .... Two tablets two times a day 14)  Vitamin C Cr 500 Mg Cr-tabs (Ascorbic acid) 15)  Zolpidem Tartrate 10 Mg Tabs (Zolpidem tartrate) .... Take 1 tablet by mouth once a day at bedtime 16)  Levothroid 88 Mcg Tabs (Levothyroxine sodium) .... Take 1 tablet by mouth once a day 17)  Furosemide 20 Mg Tabs (Furosemide) .... 2 tabs by  mouth daily 18)  Methocarbamol 500 Mg Tabs (Methocarbamol) .... Take 1 tablet by mouth three times a day 19)  Potassium Chloride Cr 10 Meq Cr-caps (Potassium chloride) .... Take 1 tablet by mouth once a day 20)  Symbicort 160-4.5 Mcg/act Aero (Budesonide-formoterol fumarate) .... Use 2 puffs in the mornings and in the evenings 21)  Omeprazole 20 Mg Tbec (Omeprazole) .... Take oen tablet by mouth twice a day 22)  Tramadol Hcl 50 Mg Tabs (Tramadol hcl) .... One by mouth eery 4-6 hours as needed as needed back pain 23)  Losartan Potassium 50 Mg Tabs (Losartan potassium) .... Take 1 tablet by mouth once a day 24)  Proair Hfa 108 (90 Base) Mcg/act Aers (Albuterol sulfate) .... 2 puffs q 4 hrs prn 25)  Seroquel Xr 150 Mg Xr24h-tab (Quetiapine fumarate) .... Take 1 tablet by mouth once a day 26)  Xyzal 5 Mg Tabs (Levocetirizine dihydrochloride) .... Take 1 tablet by mouth once a day 27)  Ipratropium-albuterol 0.5-2.5 (3) Mg/97ml Soln (Ipratropium-albuterol) .... Uese on vial in a neb every 4 hours as needed 28)  Chantix Continuing Month Pak 1 Mg Tabs (Varenicline  tartrate) .... Take as directed. 29)  Doxycycline Hyclate 100 Mg Tabs (Doxycycline hyclate) .... Take 1 tablet by mouth two times a day for 10 days 30)  Prednisone 20 Mg Tabs (Prednisone) .... 2 tabs daily for one week, then drop to 1 tab daily for one week, then 1/2 tab daily for one week. 31)  Albuterol Sulfate (5 Mg/ml) 0.5% Nebu (Albuterol sulfate) .... Neb up to three times a day 32)  Fluticasone Propionate 50 Mcg/act Susp (Fluticasone propionate) .... 2 sprays in each nostril daily  Patient Instructions: 1)  I increased your seroquel. Next dose is the 150mg  tab. If too strong then let me know.  2)  Will change your fexofenadine to zyzal. Take once a day for allergies.   3)  Sent over prednisone to start tomorrow.  4)  Follow up in one week to make sure you are getting better. 5)  Complete all the antibiotic.  Prescriptions: FLUTICASONE PROPIONATE 50 MCG/ACT SUSP (FLUTICASONE PROPIONATE) 2 sprays in each nostril daily  #1 x 3   Entered and Authorized by:   Nani Gasser MD   Signed by:   Nani Gasser MD on 12/20/2010   Method used:   Electronically to        [facility]* (retail)       [address] [place], [place]  [address]       Ph: [phone]       Fax: [phone]   RxID:   [id number] ALBUTEROL SULFATE (5 MG/ML) 0.5% NEBU (ALBUTEROL SULFATE) NEB up to three times a day  #60 vial. x 1   Entered and Authorized by:   Nani Gasser MD   Signed by:   Nani Gasser MD on 12/20/2010   Method used:   Electronically to        [facility]* (retail)       [address] [place], [place]  [address]       Ph: [phone]       Fax: [phone]   RxID:   [id number] PREDNISONE 20 MG TABS (PREDNISONE) 2 tabs daily for one week, then drop to 1 tab daily for one week, then 1/2 tab daily for one week.  #26 x 0   Entered and Authorized by:  Nani Gasser MD   Signed by:   Nani Gasser MD on 12/20/2010   Method used:    Electronically to        [facility]* (retail)       [address] [place], [place]  [address]       Ph: [phone]       Fax: [phone]   RxID:   [id number] DOXYCYCLINE HYCLATE 100 MG TABS (DOXYCYCLINE HYCLATE) Take 1 tablet by mouth two times a day for 10 days  #20 x 0   Entered and Authorized by:   Nani Gasser MD   Signed by:   Nani Gasser MD on 12/20/2010   Method used:   Electronically to        [facility]* (retail)       [address] [place], [place]  [address]       Ph: [phone]       Fax: [phone]   RxID:   [id number] XYZAL 5 MG TABS (LEVOCETIRIZINE DIHYDROCHLORIDE) Take 1 tablet by mouth once a day  #30 x 3   Entered and Authorized by:   Nani Gasser MD   Signed by:   Nani Gasser MD on 12/20/2010   Method used:   Electronically to        [facility]* (retail)       [address] [place], [place]  [address]       Ph: [phone]       Fax: [phone]   RxID:   [id number] SYMBICORT 160-4.5 MCG/ACT AERO (BUDESONIDE-FORMOTEROL FUMARATE) Use 2 puffs in the mornings and in the evenings  #1 x 3   Entered and Authorized by:   Nani Gasser MD   Signed by:   Nani Gasser MD on 12/20/2010   Method used:   Electronically to        [facility]* (retail)       [address] [place], [place]  [address]       Ph: [phone]       Fax: [phone]   RxID:   [id number] SEROQUEL XR 150 MG XR24H-TAB (QUETIAPINE FUMARATE) Take 1 tablet by mouth once a day  #30 x 2   Entered and Authorized by:   Nani Gasser MD   Signed by:   Nani Gasser MD on 12/20/2010   Method used:   Electronically to        [facility]* (retail)       [address] [place], [place]  [address]       Ph: [phone]       Fax: [phone]   RxID:   [id number]    Medication Administration  Injection # 1:    Medication: Depo- Medrol 80mg     Diagnosis: ASTHMA, INTRINSIC  (ICD-493.10)    Route: IM    Site: LUOQ gluteus    Exp Date: 04/22/2011    Lot #: [id number]    Mfr: Pharmacia    Patient tolerated injection without complications    Given by: Avon Gully CMA, Duncan Dull) (December 20, 2010 4:51 PM)  Medication # 1:    Medication: Albuterol Sulfate Sol 1mg  unit dose    Diagnosis: ASTHMA, INTRINSIC (ICD-493.10)    Dose: 5 mg/ml    Route: inhaled  Patient tolerated medication without complications    Given by: Avon Gully CMA, Duncan Dull) (December 20, 2010 4:59 PM)  Orders Added: 1)  Depo- Medrol 80mg  [J1040] 2)  Admin of Therapeutic Inj  intramuscular or subcutaneous [96372] 3)  Home Health Referral [facility]] 4)  Albuterol Sulfate Sol 1mg  unit dose [J7613] 5)  Est. Patient Level IV [99214] 6)  Peak Flow Rate [94150] 7)  Peak Flow Meter [A4614] 8)  Nebulizer Tx [16109]

## 2011-01-23 NOTE — Progress Notes (Signed)
Summary: refill  Phone Note Call from Patient   Caller: Patient Call For: Nani Gasser MD Summary of Call: Pt calls and needs a refill on the Prednisone that her allergist gives her. And wants to know if you will fill this. Prednisone 5mg  1 tab daily. Send to [facility] in [place] Initial call taken by: Kathlene November LPN,  December 20, 2010 11:02 AM  Follow-up for Phone Call        She really needs to get this from her Allergist. If their office is closed or something then I can call it in 1 x but really needs to get it from them.  Follow-up by: Nani Gasser MD,  December 20, 2010 11:07 AM  Additional Follow-up for Phone Call Additional follow up Details #1::        She hasn't seen this allergist at [facility] in over a year. Said she is supposed to go see new allergist but hasn't had appointment yet. Additional Follow-up by: Kathlene November LPN,  December 20, 2010 11:36 AM    Additional Follow-up for Phone Call Additional follow up Details #2::    Tell her to keep her appt for this afternoon.  Follow-up by: Nani Gasser MD,  December 20, 2010 11:57 AM  Additional Follow-up for Phone Call Additional follow up Details #3:: Details for Additional Follow-up Action Taken: Pt notified to keep appt Additional Follow-up by: Kathlene November LPN,  December 20, 2010 11:59 AM

## 2011-01-23 NOTE — Assessment & Plan Note (Signed)
Summary: F/U ER visiti   Vital Signs:  Patient profile:   50 year old female Height:      63 inches Weight:      261.25 pounds BMI:     46.45 Pulse rate:   88 / minute BP sitting:   148 / 87  (right arm) Cuff size:   large  Vitals Entered By: Francee Piccolo CMA Duncan Dull) (January 07, 2011 3:03 PM) CC: ER follow up   Primary Care Provider:  Nani Gasser MD  CC:  ER follow up.  History of Present Illness: Seen in the ED for COPD/Asthma exacerbation. she was prescribed Levaquin and an extended course of steroid.  she is still taking the 20 mg daily steroid that I gave her at the last office visit.  She is still using her nebs at home.  She feels  she is some better than when I saw her at the last visit about a week ago.  Still SOB when she moves around.she feels that her face has been swelling. Also her left foot was swollen adn this is why she went to the ED. Started swellig friday night.  It was very painful.  whole foot was red.  No trauma. No hx of gout.  Took 4 of her fluids pills the day after she went to the emergency department.  She says that she did not diurese at all.  By the following day she took her regular dose of diuretic and did start diuresis.  She says overall the swelling is improved but is still there.  I do note that her weight is up today in a very short period of time.  She says it is still uncomfortable and painful but not as much as it was when she went to the emergency room.  She never filled the Levaquin..     Current Medications (verified): 1)  Spiriva Handihaler 18 Mcg Caps (Tiotropium Bromide Monohydrate) .... One Inhalation Once A Day 2)  Alprazolam 0.5 Mg Tabs (Alprazolam) .... One Tablet Three Times A Day As Needed 3)  Gabapentin 600 Mg Tabs (Gabapentin) .... One Tablet Once Daily 4)  Zanaflex 4 Mg Tabs (Tizanidine Hcl) .... One Tablet Three Times A Day As Needed 5)  Cymbalta 60 Mg Cpep (Duloxetine Hcl) .... Take 1 Tablet By Mouth Once A Day 6)   Meloxicam 15 Mg Tabs (Meloxicam) .Marland Kitchen.. 1 By Mouth Once Daily 7)  Crestor 20 Mg Tabs (Rosuvastatin Calcium) .... Take One Tablet By Mouth Once A Day 8)  Centrum  Tabs (Multiple Vitamins-Minerals) 9)  Vitamin B-12 500 Mcg Tabs (Cyanocobalamin) .... One Tablet Two Times A Day 10)  Cvs Iron 325 (65 Fe) Mg Tabs (Ferrous Sulfate) .... One Tablet Two Times A Day 11)  Cvs Vitamin E 400 Unit Caps (Vitamin E) 12)  Aspartate Mg & K 90-90 Mg Caps (Mag Aspart-Potassium Aspart) 13)  Calcium-Magnesium-Vitamin D 200-100-33.3 Mg-Mg-Unit Caps (Calcium-Magnesium-Vitamin D) .... Two Tablets Two Times A Day 14)  Vitamin C Cr 500 Mg Cr-Tabs (Ascorbic Acid) 15)  Zolpidem Tartrate 10 Mg Tabs (Zolpidem Tartrate) .... Take 1 Tablet By Mouth Once A Day At Bedtime 16)  Levothroid 88 Mcg Tabs (Levothyroxine Sodium) .... Take 1 Tablet By Mouth Once A Day 17)  Furosemide 20 Mg Tabs (Furosemide) .... 2 Tabs By Mouth Daily 18)  Methocarbamol 500 Mg Tabs (Methocarbamol) .... Take 1 Tablet By Mouth Three Times A Day 19)  Potassium Chloride Cr 10 Meq Cr-Caps (Potassium Chloride) .... Take 1 Tablet By  Mouth Once A Day 20)  Symbicort 160-4.5 Mcg/act Aero (Budesonide-Formoterol Fumarate) .... Use 2 Puffs in The Mornings and in The Evenings 21)  Omeprazole 20 Mg Tbec (Omeprazole) .... Take Oen Tablet By Mouth Twice A Day 22)  Tramadol Hcl 50 Mg Tabs (Tramadol Hcl) .... One By Mouth Eery 4-6 Hours As Needed As Needed Back Pain 23)  Losartan Potassium 50 Mg Tabs (Losartan Potassium) .... Take 1 Tablet By Mouth Once A Day 24)  Proair Hfa 108 (90 Base) Mcg/act Aers (Albuterol Sulfate) .... 2 Puffs Q 4 Hrs Prn 25)  Seroquel Xr 150 Mg Xr24h-Tab (Quetiapine Fumarate) .... Take 1 Tablet By Mouth Once A Day 26)  Xyzal 5 Mg Tabs (Levocetirizine Dihydrochloride) .... Take 1 Tablet By Mouth Once A Day 27)  Ipratropium-Albuterol 0.5-2.5 (3) Mg/83ml Soln (Ipratropium-Albuterol) .... Uese On Vial in A Neb Every 4 Hours As Needed 28)  Chantix  Continuing Month Pak 1 Mg Tabs (Varenicline Tartrate) .... Take As Directed. 29)  Doxycycline Hyclate 100 Mg Tabs (Doxycycline Hyclate) .... Take 1 Tablet By Mouth Two Times A Day For 10 Days 30)  Prednisone 20 Mg Tabs (Prednisone) .... 2 Tabs Daily For One Week, Then Drop To 1 Tab Daily For One Week, Then 1/2 Tab Daily For One Week. 31)  Albuterol Sulfate (5 Mg/ml) 0.5% Nebu (Albuterol Sulfate) .... Neb Up To Three Times A Day 32)  Fluticasone Propionate 50 Mcg/act Susp (Fluticasone Propionate) .... 2 Sprays in Each Nostril Daily 33)  Hydrocodone-Acetaminophen 5-325 Mg Tabs (Hydrocodone-Acetaminophen) .... Take 1-2 Tablet Every 6 Hours As Needed For Back Pain  Allergies (verified): 1)  ! Abilify (Aripiprazole) 2)  Prozac (Fluoxetine Hcl) 3)  Paxil (Paroxetine Hcl)  Past History:  Past Medical History: Last updated: 12/12/2008 ENT - Dr. Carolyne Fiscal at [facility].  Pulm - Dr. Enid Skeens at [facility].   Had a normal Sleep study.   Neuro-   Social History: Last updated: 03/18/2010 Brother is Laretta Alstrom.   Unemployed, on disability.  HS degree.  Divorced.  ONe daughter. Lives with her mother currently.   Current Smoker Drug use-no Regular exercise-no  Physical Exam  General:  Well-developed,well-nourished,in no acute distress; alert,appropriate and cooperative throughout examination Head:  Normocephalic and atraumatic without obvious abnormalities. No apparent alopecia or balding. Lungs:  Normal respiratory effort, chest expands symmetrically. Lungs are clear to auscultation, no crackles or wheezes. Heart:  Normal rate and regular rhythm. S1 and S2 normal without gallop, murmur, click, rub or other extra sounds. Pulses:  rear pulses 2+ bilaterally Extremities:  she does have 1+ edema on the left ankle and foot.  Trace edema on the right ankle Skin:  just some slight redness over her left heel.  She is slightly tender around the lateral malleolus.  Normal range of motion of the ankle  joint Cervical Nodes:  No lymphadenopathy noted Psych:  Cognition and judgment appear intact. Alert and cooperative with normal attention span and concentration. No apparent delusions, illusions, hallucinations   Impression & Recommendations:  Problem # 1:  ASTHMA, INTRINSIC (ICD-493.10) I think she is still improving from her asthma exacerbation.  A regular schedule for her to slowly decrease her steroid over the next couple of weeks.  I would like to see her back in two to 3 weeks to make sure she is improving. I reviewed the emergency room department report.  She did have a negative chest x-ray.  She also had negative straight Dopplers to rule out DVT.  At this point everything she  is improving and have asked her to hold off on the Levaquin.  Seh is stableoff abs  I think  time and  tapering her steroids she does have a lot of allergens which trigger her asthma.  Unfortunately she is not doing allergy shots right now and this tends to help her.  We could consider restarting Singulair. Her updated medication list for this problem includes:    Spiriva Handihaler 18 Mcg Caps (Tiotropium bromide monohydrate) ..... One inhalation once a day    Symbicort 160-4.5 Mcg/act Aero (Budesonide-formoterol fumarate) ..... Use 2 puffs in the mornings and in the evenings    Proair Hfa 108 (90 Base) Mcg/act Aers (Albuterol sulfate) .Marland Kitchen... 2 puffs q 4 hrs prn    Ipratropium-albuterol 0.5-2.5 (3) Mg/33ml Soln (Ipratropium-albuterol) ..... Uese on vial in a neb every 4 hours as needed    Prednisone 10 Mg Tabs (Prednisone) .Marland Kitchen... Take 1 tablet by mouth once a day for 10 days, then 1/2 tab for one week, then stop    Albuterol Sulfate (5 Mg/ml) 0.5% Nebu (Albuterol sulfate) ..... Neb up to three times a day  Problem # 2:  LEG EDEMA, BILATERAL (ICD-782.3) I explained her that I think the edema in her left leg is primarily from the steroids.  She may also have some venous stasis.She had a normal LE doppler study that rule  out DVT.  MAy benefit from compression stocking. Will seee if she gets better as we wean the steroid.  Her updated medication list for this problem includes:    Furosemide 20 Mg Tabs (Furosemide) .Marland Kitchen... 2 tabs by mouth daily  Complete Medication List: 1)  Spiriva Handihaler 18 Mcg Caps (Tiotropium bromide monohydrate) .... One inhalation once a day 2)  Alprazolam 0.5 Mg Tabs (Alprazolam) .... One tablet three times a day as needed 3)  Gabapentin 600 Mg Tabs (Gabapentin) .... One tablet once daily 4)  Zanaflex 4 Mg Tabs (Tizanidine hcl) .... One tablet three times a day as needed 5)  Cymbalta 60 Mg Cpep (Duloxetine hcl) .... Take 1 tablet by mouth once a day 6)  Meloxicam 15 Mg Tabs (Meloxicam) .Marland Kitchen.. 1 by mouth once daily 7)  Crestor 20 Mg Tabs (Rosuvastatin calcium) .... Take one tablet by mouth once a day 8)  Centrum Tabs (Multiple vitamins-minerals) 9)  Vitamin B-12 500 Mcg Tabs (Cyanocobalamin) .... One tablet two times a day 10)  Cvs Iron 325 (65 Fe) Mg Tabs (Ferrous sulfate) .... One tablet two times a day 11)  Cvs Vitamin E 400 Unit Caps (Vitamin e) 12)  Aspartate Mg & K 90-90 Mg Caps (Mag aspart-potassium aspart) 13)  Calcium-magnesium-vitamin D 200-100-33.3 Mg-mg-unit Caps (Calcium-magnesium-vitamin d) .... Two tablets two times a day 14)  Vitamin C Cr 500 Mg Cr-tabs (Ascorbic acid) 15)  Zolpidem Tartrate 10 Mg Tabs (Zolpidem tartrate) .... Take 1 tablet by mouth once a day at bedtime 16)  Levothroid 88 Mcg Tabs (Levothyroxine sodium) .... Take 1 tablet by mouth once a day 17)  Furosemide 20 Mg Tabs (Furosemide) .... 2 tabs by mouth daily 18)  Methocarbamol 500 Mg Tabs (Methocarbamol) .... Take 1 tablet by mouth three times a day 19)  Potassium Chloride Cr 10 Meq Cr-caps (Potassium chloride) .... Take 1 tablet by mouth once a day 20)  Symbicort 160-4.5 Mcg/act Aero (Budesonide-formoterol fumarate) .... Use 2 puffs in the mornings and in the evenings 21)  Omeprazole 20 Mg Tbec  (Omeprazole) .... Take oen tablet by mouth twice a day 22)  Tramadol Hcl 50 Mg Tabs (Tramadol hcl) .... One by mouth eery 4-6 hours as needed as needed back pain 23)  Losartan Potassium 50 Mg Tabs (Losartan potassium) .... Take 1 tablet by mouth once a day 24)  Proair Hfa 108 (90 Base) Mcg/act Aers (Albuterol sulfate) .... 2 puffs q 4 hrs prn 25)  Seroquel Xr 150 Mg Xr24h-tab (Quetiapine fumarate) .... Take 1 tablet by mouth once a day 26)  Xyzal 5 Mg Tabs (Levocetirizine dihydrochloride) .... Take 1 tablet by mouth once a day 27)  Ipratropium-albuterol 0.5-2.5 (3) Mg/74ml Soln (Ipratropium-albuterol) .... Uese on vial in a neb every 4 hours as needed 28)  Chantix Continuing Month Pak 1 Mg Tabs (Varenicline tartrate) .... Take as directed. 29)  Doxycycline Hyclate 100 Mg Tabs (Doxycycline hyclate) .... Take 1 tablet by mouth two times a day for 10 days 30)  Prednisone 10 Mg Tabs (Prednisone) .... Take 1 tablet by mouth once a day for 10 days, then 1/2 tab for one week, then stop 31)  Albuterol Sulfate (5 Mg/ml) 0.5% Nebu (Albuterol sulfate) .... Neb up to three times a day 32)  Fluticasone Propionate 50 Mcg/act Susp (Fluticasone propionate) .... 2 sprays in each nostril daily 33)  Hydrocodone-acetaminophen 5-325 Mg Tabs (Hydrocodone-acetaminophen) .... Take 1-2 tablet every 6 hours as needed for back pain  Patient Instructions: 1)  Drop  prednisone to 10 mg daily for one week, then 1/2 tab by mouth daily for one week. Then stop 2)  Please schedule a follow-up appointment in 3 weeks.   Prescriptions: PREDNISONE 10 MG TABS (PREDNISONE) Take 1 tablet by mouth once a day for 10 days, then 1/2 tab for one week, then stop  #15 x 0   Entered and Authorized by:   Nani Gasser MD   Signed by:   Nani Gasser MD on 01/07/2011   Method used:   Electronically to        [facility]* (retail)       [address] [place], [place]  [address]       Ph: [phone]       Fax:  [phone]   RxID:   [id number]    Orders Added: 1)  Est. Patient Level IV [74[address]]    Laboratory Results

## 2011-01-30 ENCOUNTER — Telehealth: Payer: Self-pay | Admitting: Family Medicine

## 2011-02-06 NOTE — Progress Notes (Addendum)
Summary: meds  Phone Note From Pharmacy  on January 30, 2011 10:07 AM  Summary of Call: recieved refill request from pharm for cymbalta 30 mg 0nce a day. we have been giving her 60 once daily.please advise Initial call taken by: Avon Gully CMA, Duncan Dull),  January 30, 2011 10:08 AM  Follow-up for Phone Call        I am pretty sure she takes 60mg .  Follow-up by: Nani Gasser MD,  January 30, 2011 1:31 PM  Additional Follow-up for Phone Call Additional follow up Details #1::        pt does take 60  a day Additional Follow-up by: Avon Gully CMA, Duncan Dull),  January 31, 2011 9:38 AM    Prescriptions: CYMBALTA 60 MG CPEP (DULOXETINE HCL) Take 1 tablet by mouth once a day  #30 Each x 3   Entered by:   Avon Gully CMA, (AAMA)   Authorized by:   Nani Gasser MD   Signed by:   Avon Gully CMA, Duncan Dull) on 01/31/2011   Method used:   Electronically to        [facility]* (retail)       [address] [place], [place]  [address]       Ph: [phone]       Fax: [phone]   RxID:   [id number]

## 2011-02-12 NOTE — Medication Information (Signed)
Summary: [facility] supply order   [facility] supply order   Imported By: Kassie Mends 02/06/2011 08:34:09  _____________________________________________________________________  External Attachment:    Type:   Image     Comment:   External Document

## 2011-03-07 ENCOUNTER — Encounter: Payer: Self-pay | Admitting: Family Medicine

## 2011-03-07 ENCOUNTER — Ambulatory Visit (INDEPENDENT_AMBULATORY_CARE_PROVIDER_SITE_OTHER): Payer: Medicare Other | Admitting: Family Medicine

## 2011-03-07 DIAGNOSIS — J45909 Unspecified asthma, uncomplicated: Secondary | ICD-10-CM

## 2011-03-07 DIAGNOSIS — J329 Chronic sinusitis, unspecified: Secondary | ICD-10-CM

## 2011-03-20 NOTE — Assessment & Plan Note (Signed)
Summary: HFU for asthma exacebation   Vital Signs:  Patient profile:   50 year old female Height:      63 inches Weight:      261 pounds O2 Sat:      95 % on Room air Temp:     98.1 degrees F oral Pulse rate:   98 / minute BP sitting:   141 / 74  (right arm) Cuff size:   large  Vitals Entered By: Avon Gully CMA, (AAMA) (March 07, 2011 1:28 PM)  O2 Flow:  Room air  Serial Vital Signs/Assessments:                                PEF    PreRx  PostRx Time      O2 Sat  O2 Type     L/min  L/min  L/min   By 1:31 PM   95  %   Room air                          Sue Lush McCrimmon CMA, (AAMA)  Comments: 1:31 PM peak flow 170,150,160 pt is in the red zone By: Avon Gully CMA, (AAMA)   CC: asthma excacerbation,hosp f/u ,last tx was 1130 this am   Primary Care Provider:  Nani Gasser MD  CC:  asthma excacerbation, hosp f/u , and last tx was 1130 this am.  History of Present Illness: Has f/u with Pulm in 2 weeks.  Was given a zpack for sinsusitis. Also has severe allergies when her mom opened the windows. Was so SOB couldn't get out of bed.  Was given IV solumedrol and nebs every 2 hours.  Was there for about 12 hours.      She is overall feeling better.  Say her trigger was her mother opening the windows. She has se vere allergies and she feel the pollen triggered her asthma/COPD   Allergies: 1)  ! Abilify (Aripiprazole) 2)  Prozac (Fluoxetine Hcl) 3)  Paxil (Paroxetine Hcl)  Physical Exam  General:  Well-developed,well-nourished,in no acute distress; alert,appropriate and cooperative throughout examination Head:  Normocephalic and atraumatic without obvious abnormalities. No apparent alopecia or balding. Eyes:  No corneal or conjunctival inflammation noted. EOMI. Perrla. Ears:  External ear exam shows no significant lesions or deformities.  Otoscopic examination reveals clear canals, tympanic membranes are intact bilaterally without bulging, retraction,  inflammation or discharge. Hearing is grossly normal bilaterally. Nose:  External nasal examination shows no deformity or inflammation. Mouth:  Oral mucosa and oropharynx without lesions or exudates.  Teeth in good repair. Lungs:  normal respiratory effort.  Dec air movement. No wheezing.   Heart:  Normal rate and regular rhythm. S1 and S2 normal without gallop, murmur, click, rub or other extra sounds. Skin:  no rashes.   Cervical Nodes:  No lymphadenopathy noted Psych:  Cognition and judgment appear intact. Alert and cooperative with normal attention span and concentration. No apparent delusions, illusions, hallucinations   Impression & Recommendations:  Problem # 1:  ASTHMA, INTRINSIC (ICD-493.10) She is stable. Do rec nebs 4 x a day and wean as feeling better. Did give her samples of Singulair.  Also given samples of symbicort as well.  Unfortunately she will not have drug coverage for about a month so will do our best with samples.  Also send in rx zpack and given a sample 12 day pack of steroids since  she can't afford her meds.  Her updated medication list for this problem includes:    Spiriva Handihaler 18 Mcg Caps (Tiotropium bromide monohydrate) ..... One inhalation once a day    Symbicort 160-4.5 Mcg/act Aero (Budesonide-formoterol fumarate) ..... Use 2 puffs in the mornings and in the evenings    Proair Hfa 108 (90 Base) Mcg/act Aers (Albuterol sulfate) .Marland Kitchen... 2 puffs q 4 hrs prn    Ipratropium-albuterol 0.5-2.5 (3) Mg/67ml Soln (Ipratropium-albuterol) ..... Uese on vial in a neb every 4 hours as needed    Prednisone 10 Mg Tabs (Prednisone) .Marland Kitchen... Take 1 tablet by mouth once a day for 10 days, then 1/2 tab for one week, then stop    Albuterol Sulfate (5 Mg/ml) 0.5% Nebu (Albuterol sulfate) ..... Neb up to three times a day  Problem # 2:  SINUSITIS (ICD-473.9) Assessment: Deteriorated  Her updated medication list for this problem includes:    Doxycycline Hyclate 100 Mg Tabs  (Doxycycline hyclate) .Marland Kitchen... Take 1 tablet by mouth two times a day for 10 days    Fluticasone Propionate 50 Mcg/act Susp (Fluticasone propionate) .Marland Kitchen... 2 sprays in each nostril daily    Zithromax Z-pak 250 Mg Tabs (Azithromycin) .Marland Kitchen... Take as directed.  Take antibiotics for full duration. Discussed treatment options including indications for coronal CT scan of sinuses and ENT referral.   Complete Medication List: 1)  Spiriva Handihaler 18 Mcg Caps (Tiotropium bromide monohydrate) .... One inhalation once a day 2)  Alprazolam 0.5 Mg Tabs (Alprazolam) .... One tablet three times a day as needed 3)  Gabapentin 600 Mg Tabs (Gabapentin) .... One tablet once daily 4)  Zanaflex 4 Mg Tabs (Tizanidine hcl) .... One tablet three times a day as needed 5)  Cymbalta 60 Mg Cpep (Duloxetine hcl) .... Take 1 tablet by mouth once a day 6)  Meloxicam 15 Mg Tabs (Meloxicam) .Marland Kitchen.. 1 by mouth once daily 7)  Crestor 20 Mg Tabs (Rosuvastatin calcium) .... Take one tablet by mouth once a day 8)  Centrum Tabs (Multiple vitamins-minerals) 9)  Vitamin B-12 500 Mcg Tabs (Cyanocobalamin) .... One tablet two times a day 10)  Cvs Iron 325 (65 Fe) Mg Tabs (Ferrous sulfate) .... One tablet two times a day 11)  Cvs Vitamin E 400 Unit Caps (Vitamin e) 12)  Aspartate Mg & K 90-90 Mg Caps (Mag aspart-potassium aspart) 13)  Calcium-magnesium-vitamin D 200-100-33.3 Mg-mg-unit Caps (Calcium-magnesium-vitamin d) .... Two tablets two times a day 14)  Vitamin C Cr 500 Mg Cr-tabs (Ascorbic acid) 15)  Zolpidem Tartrate 10 Mg Tabs (Zolpidem tartrate) .... Take 1 tablet by mouth once a day at bedtime 16)  Levothroid 88 Mcg Tabs (Levothyroxine sodium) .... Take 1 tablet by mouth once a day 17)  Furosemide 20 Mg Tabs (Furosemide) .... 2 tabs by mouth daily 18)  Methocarbamol 500 Mg Tabs (Methocarbamol) .... Take 1 tablet by mouth three times a day 19)  Potassium Chloride Cr 10 Meq Cr-caps (Potassium chloride) .... Take 1 tablet by mouth  once a day 20)  Symbicort 160-4.5 Mcg/act Aero (Budesonide-formoterol fumarate) .... Use 2 puffs in the mornings and in the evenings 21)  Omeprazole 20 Mg Tbec (Omeprazole) .... Take oen tablet by mouth twice a day 22)  Tramadol Hcl 50 Mg Tabs (Tramadol hcl) .... One by mouth eery 4-6 hours as needed as needed back pain 23)  Losartan Potassium 50 Mg Tabs (Losartan potassium) .... Take 1 tablet by mouth once a day 24)  Proair Hfa 108 (90 Base) Mcg/act  Aers (Albuterol sulfate) .... 2 puffs q 4 hrs prn 25)  Seroquel Xr 150 Mg Xr24h-tab (Quetiapine fumarate) .... Take 1 tablet by mouth once a day 26)  Xyzal 5 Mg Tabs (Levocetirizine dihydrochloride) .... Take 1 tablet by mouth once a day 27)  Ipratropium-albuterol 0.5-2.5 (3) Mg/30ml Soln (Ipratropium-albuterol) .... Uese on vial in a neb every 4 hours as needed 28)  Chantix Continuing Month Pak 1 Mg Tabs (Varenicline tartrate) .... Take as directed. 29)  Doxycycline Hyclate 100 Mg Tabs (Doxycycline hyclate) .... Take 1 tablet by mouth two times a day for 10 days 30)  Prednisone 10 Mg Tabs (Prednisone) .... Take 1 tablet by mouth once a day for 10 days, then 1/2 tab for one week, then stop 31)  Albuterol Sulfate (5 Mg/ml) 0.5% Nebu (Albuterol sulfate) .... Neb up to three times a day 32)  Fluticasone Propionate 50 Mcg/act Susp (Fluticasone propionate) .... 2 sprays in each nostril daily 33)  Hydrocodone-acetaminophen 5-325 Mg Tabs (Hydrocodone-acetaminophen) .... Take 1-2 tablet every 6 hours as needed for back pain 34)  Zithromax Z-pak 250 Mg Tabs (Azithromycin) .... Take as directed. Prescriptions: ZITHROMAX Z-PAK 250 MG TABS (AZITHROMYCIN) Take as directed.  #1 pack x 0   Entered and Authorized by:   Nani Gasser MD   Signed by:   Nani Gasser MD on 03/07/2011   Method used:   Electronically to        [facility]* (retail)       [address] [place], [place]  [address]       Ph: [phone]       Fax: [phone]   RxID:    [id number]    Orders Added: 1)  Est. Patient Level IV [[address]132]

## 2011-04-01 NOTE — Telephone Encounter (Signed)
Addended by: Nani Gasser on: 04/01/2011 07:42 AM   Modules accepted: Orders

## 2011-04-19 ENCOUNTER — Other Ambulatory Visit: Payer: Self-pay | Admitting: Family Medicine

## 2011-04-24 ENCOUNTER — Other Ambulatory Visit: Payer: Self-pay | Admitting: Family Medicine

## 2011-04-28 ENCOUNTER — Other Ambulatory Visit: Payer: Self-pay | Admitting: Family Medicine

## 2011-05-06 NOTE — Procedures (Signed)
NAME:  Krystal Burgess, Krystal Burgess NO.:  [id number]   MEDICAL RECORD NO.:  [id number]           PATIENT TYPE:   LOCATION:                                 FACILITY:   PHYSICIAN:  Erick Colace, M.D.DATE OF BIRTH:  Jul 12, 1961   DATE OF PROCEDURE:  DATE OF DISCHARGE:                               OPERATIVE REPORT   A 50 year old female with myofascial pain syndrome, left upper  trapezius.  Her pain is only partially responsive to medication  management including the muscle relaxants and nonsteroidal anti-  inflammatories interferes with activity.   Procedures done by Dr. Lorriane Shire under my direct supervision, I palpated the  trigger point first.  We marked and prepped with Betadine and entered  with a 25-gauge inch and half needle, 1.5 mL of lidocaine were  infiltrated in a fan-like pattern with positive twitch sign noted at two  sites.  The patient tolerated the procedure well.  All injections done  after negative drawback from blood.  Post injection instructions given.      Erick Colace, M.D.  Electronically Signed     AEK/MEDQ  D:  01/12/2009 12:11:28  T:  01/13/2009 02:09:07  Job:  [id number]

## 2011-05-06 NOTE — Procedures (Signed)
NAME:  Krystal Burgess, PETRAK NO.:  [id number]   MEDICAL RECORD NO.:  [id number]           PATIENT TYPE:   LOCATION:                                 FACILITY:   PHYSICIAN:  Erick Colace, M.D.DATE OF BIRTH:  Apr 08, 1961   DATE OF PROCEDURE:  DATE OF DISCHARGE:                               OPERATIVE REPORT   PROCEDURE:  L5-S1 translaminar lumbar epidural steroid injection under  fluoroscopic guidance.   INDICATION:  Lumbar pain with MRI demonstrating focal disk protrusion L5-  S1.  The patient has left greater than right lower extremity pain.   Informed consent was obtained after describing the risks and benefits of  the procedure with the patient.  These include bleeding, bruising,  infection.  She elects to proceed and has given written consent.  The  patient placed prone on the fluoroscopy table.  Betadine prep, sterilely  draped.  A 25-gauge, 1-1/2-inch needle was used to anesthetize the skin  and subcu tissue, 1% lidocaine x2 mL, then an 18-gauge, 6-inch Quincke  type needle was inserted under fluoroscopic guidance into the L5-S1  interlaminar space.  AP and lateral images utilized.  Once the needle  approximated with posterior aspect of the posterior elements, loss-of-  resistance technique was employed.  Positive loss of resistance with  50:50 air/saline mix followed by positive epidural spread of Omnipaque  180 under live fluoro.  Then, a solution containing 2 mL of 40 mg/mL  Depo-Medrol and 2 mL of 1% MPF lidocaine were injected.  The patient  tolerated the procedure well.  Pre and post-injection vitals stable.  Post-injection instructions given.  We will have her come back in 1  month for possible repeat injection at that time.  Pre-injection pain  level 8/10, post-injection 5/10.  We will see her back in 1 month for  possible reinjection at that time.      Erick Colace, M.D.  Electronically Signed     AEK/MEDQ  D:  01/18/2009 11:04:56   T:  01/19/2009 00:26:09  Job:  [id number]

## 2011-05-06 NOTE — Group Therapy Note (Signed)
The patient was referred by Dr. Nani Gasser to evaluate for  interventional pain procedures to help manage back pain.   [name] is a 50 year old female with a 10-year history of low back  pain.  No traumatic events.  She has had various treatments throughout  the years, but was mainly followed by [facility].  She was getting  injections approximately once or twice a month.  These injections were  done on her upper back, mid back, and low back areas.  There were 2  types of injections.  The first injection was a single midline low back  injection.  She states that sometimes after these injections she would  have some muscle spasms in her back and neck and then have some  injections into the muscles along her low back and neck area.  She  states she became a bit concerned that she was getting these injections  so frequently so she quit going to that practice.  She has however  noticed that she has had increasing leg pain since stopping these type  of injections and she has limited her ambulation on a treadmill for  exercise.  She does note that part of her problem is her COPD as well.   Her average pain is in the 7/10 range, currently at 5, interferes  activity at a moderate level.  Sleep is poor.  Pain is worse with  walking, bending, sitting, standing, improves with rest, heat,  medications, and injections.  She could walk 10-15 minutes at a time.  She climbs steps.  She drives.  She has some difficulty with household  duties and shopping.   REVIEW OF SYSTEMS:  Positive for bowel control problems, numbness,  tingling, spasms, depression, anxiety, weight gain, diarrhea,  constipation, alternating wheezing, shortness of breath, respiratory  infections, coughing, urinary problems.   PAST MEDICAL HISTORY:  History of COPD, history of hypothyroidism.   PAST SURGICAL HISTORY:  C section in 1989, pilonidal cyst 1983,  gallbladder removal 1993, hysterectomy 1999.   SOCIAL  HISTORY:  Divorced, lives with her daughter.  She smokes two  packs per day.   FAMILY HISTORY:  Heart disease, lung disease, diabetes, high blood  pressure, psychiatric problems, and disability.   PHYSICAL EXAMINATION:  Obese female in no acute distress.  Weight is 232  pounds.  Blood pressure is 152/90, pulse 101.  General, in no acute  distress.  Affect is alert.  Gait is normal.  Coordination is normal in  the upper and lower extremity, deep tendon reflexes are 3+, bilateral  biceps, triceps, brachioradialis, knee, and ankle.  Sensation is normal  in bilateral L3, but reduced in right S1 dermatome.  In the upper  extremities, he has reduced C5 dermatomal sensation, as well as left C6,  C7, C8 dermatomal sensation.   Her motor strength is 5/5, bilateral deltoid, biceps, triceps, grip.  Gait, she is able toe walk and heel walk.  She has tenderness to  palpation on lumbar paraspinals.  She has some tenderness to palpation  of left upper trapezius.  She states that she was cleaning her house a  lot yesterday.   IMPRESSION:  1. Lumbar pain with lower extremity paresthesias question whether she      may have some lumbar spinal stenosis and for that reason we will      check MRI lumbar spine.  This would help determine which type of      epidural injection would be most helpful and what  level.  She has      limited her exercise tolerance because of her pain in her back and      legs and a goal of treatment would be to increase functional and      physical activity.  2. Neck pain, upper trapezius area, I believe this is myofascial in      nature.  We will do trigger point injection today.  3. Oswestry disability score today is 50%, we will monitor.   Thank you for this interesting consultation, I will set her up with the  injection in the [facility] office for the epidural.      Erick Colace, M.D.  Electronically Signed     AEK/MedQ  D:  01/12/2009 12:10:11  T:   01/13/2009 01:50:33  Job #:  [id number]

## 2011-05-06 NOTE — Assessment & Plan Note (Signed)
Ms. Koeneman returns today.  She has a history of back pain with lower  extremity pain.  She has L4-5 right paracentral disk protrusion and L5-  S1 central disk protrusion with moderate facet disease.  She was  scheduled for repeat L5-S1 translaminar lumbar epidural steroid  injection under fluoroscopic guidance.  She has had relief for about a  week after the last injection L5-S1.  She was to get a L4-5 injection  today, however.  She has been on antibiotics for a skin infection,  caused by animal scratch.  She has tried to be more active, however,  felt like she pulled a muscle or something in her back after doing some  laundry yesterday.   Examination, she has tenderness to palpation in lumbar paraspinals.  She  has good strength in the lower extremities.  Gait is normal.  Mood and  affect are appropriate.   Oswestry score is 54%.   IMPRESSION:  1. Lumbar disk with radicular symptoms.  We will need to reschedule on      her translaminar epidural steroid injection.  We will do at the L4-      5 level based on her effect from prior.  2. Lumbar strain, superimposed on above.  We will write for some      Robaxin.  She states that the Zanaflex did not helping much.      Overall, her medications have been managed by [name] and I      have been asked to do more interventional procedures, but we will      go ahead and try this for her.      Erick Colace, M.D.  Electronically Signed     AEK/MedQ  D:  02/15/2009 13:07:59  T:  02/16/2009 01:30:18  Job #:  [id number]   cc:   Nani Gasser, M.D.  [phone]  [place], [place] [address]

## 2011-05-25 ENCOUNTER — Other Ambulatory Visit: Payer: Self-pay | Admitting: Family Medicine

## 2011-06-23 ENCOUNTER — Other Ambulatory Visit: Payer: Self-pay | Admitting: Family Medicine

## 2011-06-24 ENCOUNTER — Ambulatory Visit (INDEPENDENT_AMBULATORY_CARE_PROVIDER_SITE_OTHER): Payer: Medicare Other | Admitting: Family Medicine

## 2011-06-24 ENCOUNTER — Encounter: Payer: Self-pay | Admitting: Family Medicine

## 2011-06-24 DIAGNOSIS — J45901 Unspecified asthma with (acute) exacerbation: Secondary | ICD-10-CM

## 2011-06-24 DIAGNOSIS — J45909 Unspecified asthma, uncomplicated: Secondary | ICD-10-CM

## 2011-06-24 DIAGNOSIS — E538 Deficiency of other specified B group vitamins: Secondary | ICD-10-CM | POA: Insufficient documentation

## 2011-06-24 MED ORDER — METHYLPREDNISOLONE ACETATE 40 MG/ML IJ SUSP
80.0000 mg | Freq: Once | INTRAMUSCULAR | Status: AC
  Administered 2011-06-24: 80 mg via INTRAMUSCULAR

## 2011-06-24 MED ORDER — CYANOCOBALAMIN 1000 MCG/ML IJ SOLN
1000.0000 ug | INTRAMUSCULAR | Status: AC
  Administered 2011-06-24: 1000 ug via INTRAMUSCULAR

## 2011-06-24 MED ORDER — IPRATROPIUM BROMIDE 0.02 % IN SOLN
0.5000 mg | Freq: Once | RESPIRATORY_TRACT | Status: AC
  Administered 2011-06-24: 0.5 mg via RESPIRATORY_TRACT

## 2011-06-24 MED ORDER — PREDNISONE 20 MG PO TABS: ORAL_TABLET | ORAL | Status: DC

## 2011-06-24 MED ORDER — LORATADINE 10 MG PO TABS: 10.0000 mg | ORAL_TABLET | Freq: Every day | ORAL | Status: DC

## 2011-06-24 MED ORDER — ALBUTEROL SULFATE (2.5 MG/3ML) 0.083% IN NEBU
2.5000 mg | INHALATION_SOLUTION | Freq: Once | RESPIRATORY_TRACT | Status: AC
  Administered 2011-06-24: 2.5 mg via RESPIRATORY_TRACT

## 2011-06-24 MED ORDER — IPRATROPIUM BROMIDE 0.02 % IN SOLN: 0.5000 mg | RESPIRATORY_TRACT | Status: DC

## 2011-06-24 NOTE — Patient Instructions (Signed)
Given depomedrol 80mg  as well. Start NEBs every 4 hours at home for 2 days then every 6 hours for 2-3 days and then ever 8 hours every 2-3 days and wean down as feeling better.

## 2011-06-24 NOTE — Assessment & Plan Note (Addendum)
Acute exacerbation. Given albuterol and atrovent NEB.  Peak flow initally in the red. Given depomedrol 80mg  as well. Start NEBs every 4 hours at home for 2 days then every 6 hours for 2-3 days and then ever 8 hours every 2-3 days and wean down as feeling better. Post peak flows in the yellow zone.  F/U in 3 wk.

## 2011-06-24 NOTE — Progress Notes (Signed)
  Subjective:    Patient ID: Krystal Burgess, female    DOB: 1961/05/05, 50 y.o.   MRN: [id number]  HPI Cough and SOB for 10 days.  Has been using her NEB multiple times a day. Went through a whole box in a week of NEb VIALS. Cough is productive. No fever, Other household members with viral illness. Some bilat ear and sinus pressure. Some nasal congsetion.  Has been using her other meds regularly.  She has hx of allergies and has been in the 90s temp wise this week. Still smoking.  Having nightime sxs as well.   She also brought in her Vit B12 vial to be given today. Recently dx with B12 deficiency.    Review of Systems     Objective:   Physical Exam  Constitutional: She is oriented to person, place, and time. She appears well-developed and well-nourished.  HENT:  Head: Normocephalic and atraumatic.  Right Ear: External ear normal.  Left Ear: External ear normal.  Nose: Nose normal.  Mouth/Throat: Oropharynx is clear and moist.       TMs and canals  Are clear bilaterally.   Eyes: Conjunctivae and EOM are normal. Pupils are equal, round, and reactive to light.  Neck: Neck supple. No thyromegaly present.       Very thick neck.   Cardiovascular: Normal rate, regular rhythm and normal heart sounds.   Pulmonary/Chest: Effort normal.       Decreased BS wih fine expiratory wheeze bilatearly. No crackles.    Lymphadenopathy:    She has no cervical adenopathy.  Neurological: She is alert and oriented to person, place, and time.  Psychiatric: She has a normal mood and affect. Her behavior is normal.      Post neb: Lung exam with inc air movement and wheezing.      Assessment & Plan:  BP probably up from abluterol and from her asthma exacerbation.

## 2011-06-26 ENCOUNTER — Telehealth: Payer: Self-pay | Admitting: Family Medicine

## 2011-06-26 MED ORDER — DOXYCYCLINE HYCLATE 100 MG PO TABS: 100.0000 mg | ORAL_TABLET | Freq: Two times a day (BID) | ORAL | Status: AC

## 2011-06-26 NOTE — Telephone Encounter (Addendum)
Tried to call the pt to inform her that Ab TX was sent to her pharm, but NA after several rings. Jarvis Newcomer, LPN [facility]

## 2011-06-26 NOTE — Telephone Encounter (Signed)
Pt called and said she was in this past Tuesday, and her Ab Tx was not called in.  I looked on the med list and don't see a recent order for a Ab Tx.  Please advise.  Pt stated she was to have one called for ? Cysts.   Plan:  Routed to Dr. Marlyne Beards, LPN [facility]

## 2011-06-26 NOTE — Telephone Encounter (Signed)
That right she had a draining abscess on her Right breat. Will send over abx now.

## 2011-07-01 NOTE — Telephone Encounter (Signed)
Notified that Ab treatment called to the pharm. May call daughter number [phone] in the future.  Told pt next pt next time she is in the office to have front office staff update numbers in the system. Jarvis Newcomer, LPN [facility]

## 2011-07-24 ENCOUNTER — Other Ambulatory Visit: Payer: Self-pay | Admitting: Family Medicine

## 2011-07-25 ENCOUNTER — Other Ambulatory Visit: Payer: Self-pay | Admitting: Family Medicine

## 2011-07-25 MED ORDER — VARENICLINE TARTRATE 1 MG PO TABS: 1.0000 mg | ORAL_TABLET | Freq: Two times a day (BID) | ORAL | Status: AC

## 2011-08-24 ENCOUNTER — Other Ambulatory Visit: Payer: Self-pay | Admitting: Family Medicine

## 2011-08-25 ENCOUNTER — Encounter: Payer: Self-pay | Admitting: Family Medicine

## 2011-08-26 ENCOUNTER — Ambulatory Visit (INDEPENDENT_AMBULATORY_CARE_PROVIDER_SITE_OTHER): Payer: Medicare Other | Admitting: Family Medicine

## 2011-08-26 ENCOUNTER — Encounter: Payer: Self-pay | Admitting: Family Medicine

## 2011-08-26 VITALS — BP 153/92 | HR 92 | Ht 60.0 in | Wt 254.0 lb

## 2011-08-26 DIAGNOSIS — Z23 Encounter for immunization: Secondary | ICD-10-CM

## 2011-08-26 DIAGNOSIS — J449 Chronic obstructive pulmonary disease, unspecified: Secondary | ICD-10-CM

## 2011-08-26 DIAGNOSIS — J45901 Unspecified asthma with (acute) exacerbation: Secondary | ICD-10-CM

## 2011-08-26 DIAGNOSIS — E538 Deficiency of other specified B group vitamins: Secondary | ICD-10-CM

## 2011-08-26 MED ORDER — METHYLPREDNISOLONE ACETATE 80 MG/ML IJ SUSP
80.0000 mg | Freq: Once | INTRAMUSCULAR | Status: AC
  Administered 2011-08-26: 80 mg via INTRAMUSCULAR

## 2011-08-26 MED ORDER — PREDNISONE 20 MG PO TABS: ORAL_TABLET | ORAL | Status: DC

## 2011-08-26 MED ORDER — BUDESONIDE-FORMOTEROL FUMARATE 160-4.5 MCG/ACT IN AERO: 2.0000 | INHALATION_SPRAY | Freq: Two times a day (BID) | RESPIRATORY_TRACT | Status: DC

## 2011-08-26 MED ORDER — CYANOCOBALAMIN 1000 MCG/ML IJ SOLN
1000.0000 ug | Freq: Once | INTRAMUSCULAR | Status: AC
  Administered 2011-08-26: 1000 ug via INTRAMUSCULAR

## 2011-08-26 NOTE — Patient Instructions (Signed)
Follow up in one month for asthma exaxerbation.

## 2011-08-26 NOTE — Progress Notes (Signed)
  Subjective:    Patient ID: Krystal Burgess, female    DOB: 06-02-1961, 50 y.o.   MRN: [id number]  HPI  Ran out of symbicort.  Has been struggling with her asthma for about 2 weeks.  NO URI sxs. Chronic nasal congestion. Some mild nausea.  No fever.  She is still constantly being exposed to cigarette smoke which is clearly a trigger for her. She has been using her nebulizer treatments anywhere between 3-6 times daily.  Review of Systems     Objective:   Physical Exam  Constitutional: She is oriented to person, place, and time. She appears well-developed and well-nourished.  HENT:  Head: Normocephalic and atraumatic.  Right Ear: External ear normal.  Left Ear: External ear normal.  Nose: Nose normal.  Mouth/Throat: Oropharynx is clear and moist.       TMs and canals are clear bilaterally  Eyes: Conjunctivae are normal. Pupils are equal, round, and reactive to light.  Neck: Neck supple. No thyromegaly present.  Cardiovascular: Normal rate, regular rhythm and normal heart sounds.   Pulmonary/Chest: Effort normal. She has wheezes.       DEC BS bilat.  Wheeze at the RT base.  No crackles. Able to Speak without SOB.   Lymphadenopathy:    She has no cervical adenopathy.  Neurological: She is alert and oriented to person, place, and time.  Skin: Skin is warm and dry.  Psychiatric: She has a normal mood and affect. Her behavior is normal.          Assessment & Plan:  Asthma exacerbation-I don't think she has any symptoms of infection I think she just has recent flare from internal exposures. She  takes Spiriva. We will get her back on her Symbicort. Nutrition was sent to the pharmacy. I will also give her a course of steroids to get the inflammation down her lungs. If she is not improving consider treatment with antibiotics. Continue to use her nebs when necessary. She suddenly gets worse in discussion emergency department. Followup in one month. She was also given 80 mg of Depo-Medrol  IM.  Elevated blood pressure-her blood pressure is definitely elevated today but she is having asthma exacerbation, we will recheck in one month.  She was given a flu vaccine today.

## 2011-08-27 ENCOUNTER — Other Ambulatory Visit: Payer: Self-pay | Admitting: Family Medicine

## 2011-08-29 ENCOUNTER — Other Ambulatory Visit: Payer: Self-pay | Admitting: *Deleted

## 2011-08-29 MED ORDER — PREDNISONE 20 MG PO TABS: ORAL_TABLET | ORAL | Status: DC

## 2011-09-18 ENCOUNTER — Other Ambulatory Visit: Payer: Self-pay | Admitting: Family Medicine

## 2011-09-21 ENCOUNTER — Other Ambulatory Visit: Payer: Self-pay | Admitting: Family Medicine

## 2011-09-25 ENCOUNTER — Other Ambulatory Visit: Payer: Self-pay | Admitting: Family Medicine

## 2011-10-14 ENCOUNTER — Telehealth: Payer: Self-pay | Admitting: Family Medicine

## 2011-10-14 NOTE — Telephone Encounter (Signed)
[facility] called and needs UTD med list on this patient.  Pt being admitted for COPD.   Plan:  Med list faxed to the number provided # [phone]. Jarvis Newcomer, LPN [facility]

## 2011-10-16 ENCOUNTER — Telehealth: Payer: Self-pay | Admitting: Family Medicine

## 2011-10-16 NOTE — Telephone Encounter (Signed)
Pt's family member called to cancel pt's upcoming appt in our office because pt is in the hosp. Plan:  Appt cancelled. Jarvis Newcomer, LPN [facility]

## 2011-10-17 ENCOUNTER — Ambulatory Visit: Payer: Medicare Other | Admitting: Family Medicine

## 2011-10-22 ENCOUNTER — Other Ambulatory Visit: Payer: Self-pay | Admitting: Family Medicine

## 2011-10-23 ENCOUNTER — Other Ambulatory Visit: Payer: Self-pay | Admitting: Family Medicine

## 2011-10-30 ENCOUNTER — Ambulatory Visit (INDEPENDENT_AMBULATORY_CARE_PROVIDER_SITE_OTHER): Payer: Medicare Other | Admitting: Family Medicine

## 2011-10-30 ENCOUNTER — Encounter: Payer: Self-pay | Admitting: Family Medicine

## 2011-10-30 DIAGNOSIS — R609 Edema, unspecified: Secondary | ICD-10-CM

## 2011-10-30 DIAGNOSIS — E039 Hypothyroidism, unspecified: Secondary | ICD-10-CM

## 2011-10-30 DIAGNOSIS — J45909 Unspecified asthma, uncomplicated: Secondary | ICD-10-CM

## 2011-10-30 MED ORDER — MELOXICAM 15 MG PO TABS: 15.0000 mg | ORAL_TABLET | Freq: Every day | ORAL | Status: DC

## 2011-10-30 MED ORDER — ROSUVASTATIN CALCIUM 20 MG PO TABS: 20.0000 mg | ORAL_TABLET | Freq: Every day | ORAL | Status: DC

## 2011-10-30 NOTE — Patient Instructions (Addendum)
We will call you with your lab results. If you don't here from [place] in about a week then please give [place] a call at [phone]. Black cohash for hot flashes.   Evening primerose oil.

## 2011-10-30 NOTE — Progress Notes (Signed)
  Subjective:    Patient ID: Krystal Burgess, female    DOB: 06-01-1961, 50 y.o.   MRN: [id number]  HPI She was in the hospital for an acute asthma attack and swelling.  She required NEBS and IV fluids.  Feels much better. Says still  Feels like she is swelling some esp around her face. She has completed her steroids and is off of these. She says that he did give her a couple of refills in case she starts to get dramatic exacerbation she can go ahead and start the oral steroids. They did not make any changes to her medications. Unfortunately she was unable to afford the Daliresp. It was $300 a month. She feels like she is back to her baseline.   Review of Systems     Objective:   Physical Exam  Constitutional: She is oriented to person, place, and time. She appears well-developed and well-nourished.  HENT:  Head: Normocephalic and atraumatic.  Right Ear: External ear normal.  Left Ear: External ear normal.  Nose: Nose normal.  Mouth/Throat: Oropharynx is clear and moist.       TMs and canals are clear.   Eyes: Conjunctivae and EOM are normal. Pupils are equal, round, and reactive to light.  Neck: Neck supple. No thyromegaly present.  Cardiovascular: Normal rate, regular rhythm and normal heart sounds.   Pulmonary/Chest: Effort normal. She has wheezes.       She does have some fine expiratory wheezing bilaterally.  Lymphadenopathy:    She has no cervical adenopathy.  Neurological: She is alert and oriented to person, place, and time.  Skin: Skin is warm and dry.  Psychiatric: She has a normal mood and affect.  She is very obese.          Assessment & Plan:  COPD/asthma-she is back to her baseline. There is still hear some wheezing on exam. I let her know she starts to get worse to start her steroids again. For now continue with her neb treatments and her inhalers. Note she said she did get her pneumonia vaccine on the hospital. I will update her history. She never had her flu  vaccine in September.  Edema-I don't notice any significant edema on exam but she concerned I tried going up on her Lasix to twice a day for couple days and see if this helps. I suspect the facial edema that she is concerned about is probably more secondary to steroids.  She does need refill for couple medications.  Hypothyroid-she is well overdue to recheck her thyroid. She was given a lab slip for TSH today. She really also needs her cholesterol checked as she is not fasting today and prefers to wait on this.

## 2011-10-31 LAB — TSH: TSH: 1.052 u[IU]/mL (ref 0.350–4.500)

## 2011-11-16 ENCOUNTER — Other Ambulatory Visit: Payer: Self-pay | Admitting: Family Medicine

## 2011-11-19 ENCOUNTER — Encounter: Payer: Self-pay | Admitting: Family Medicine

## 2011-11-19 ENCOUNTER — Ambulatory Visit (INDEPENDENT_AMBULATORY_CARE_PROVIDER_SITE_OTHER): Payer: Medicare Other | Admitting: Family Medicine

## 2011-11-19 VITALS — BP 140/75 | HR 115 | Temp 98.2°F | Ht 60.0 in | Wt 259.0 lb

## 2011-11-19 DIAGNOSIS — J449 Chronic obstructive pulmonary disease, unspecified: Secondary | ICD-10-CM

## 2011-11-19 DIAGNOSIS — J3489 Other specified disorders of nose and nasal sinuses: Secondary | ICD-10-CM

## 2011-11-19 DIAGNOSIS — IMO0001 Reserved for inherently not codable concepts without codable children: Secondary | ICD-10-CM

## 2011-11-19 MED ORDER — LORATADINE-PSEUDOEPHEDRINE ER 10-240 MG PO TB24: 1.0000 | ORAL_TABLET | Freq: Every day | ORAL | Status: DC

## 2011-11-19 MED ORDER — LEVOFLOXACIN 750 MG PO TABS: 750.0000 mg | ORAL_TABLET | Freq: Every day | ORAL | Status: AC

## 2011-11-19 NOTE — Progress Notes (Signed)
  Subjective:    Patient ID: Krystal Burgess, female    DOB: Dec 15, 1961, 50 y.o.   MRN: [id number]  Cough This is a new problem. The current episode started 1 to 4 weeks ago. The problem has been gradually worsening. The problem occurs every few hours. The cough is productive of sputum. Associated symptoms include postnasal drip, shortness of breath and wheezing. The symptoms are aggravated by exercise and other. She has tried a beta-agonist inhaler, ipratropium inhaler and steroid inhaler for the symptoms. The treatment provided moderate relief. Her past medical history is significant for bronchitis and COPD.  URI  Associated symptoms include congestion, coughing and wheezing.      Review of Systems  HENT: Positive for congestion, postnasal drip and sinus pressure.   Respiratory: Positive for cough, shortness of breath and wheezing.   All other systems reviewed and are negative.       Objective:   Physical Exam  Constitutional:       Obese WF   lungs were clear at this time cardiovascular S1-S2 patient does move air that well nasal turbinates are swollen no tenderness over maxillary sinuses both TMs are clear  Dowerger's hump is present      Assessment & Plan:  COPD patient's was informed is important for her to stop smoking multiple times in this visit will minister a man treatment of doing neb and 125 mg of Solu-Medrol IM we'll place on Levaquin 750 by mouth daily for 10 days and also change her from Claritin to Claritin-D 24 hours followup with her pulmonologist if not better warned that if she continues to smoke bad consequences to occur and also instruct her to use another steroid taper it and she has one pulmonologist. Return when necessary if needed.

## 2011-11-19 NOTE — Patient Instructions (Signed)
Smoking Cessation, Tips for Success YOU CAN QUIT SMOKING If you are ready to quit smoking, congratulations! You have chosen to help yourself be healthier. Cigarettes bring nicotine, tar, carbon monoxide, and other irritants into your body. Your lungs, heart, and blood vessels will be able to work better without these poisons. There are many different ways to quit smoking. Nicotine gum, nicotine patches, a nicotine inhaler, or nicotine nasal spray can help with physical craving. Hypnosis, support groups, and medicines help break the habit of smoking. Here are some tips to help you quit for good.  Throw away all cigarettes.   Clean and remove all ashtrays from your home, work, and car.   On a card, write down your reasons for quitting. Carry the card with you and read it when you get the urge to smoke.   Cleanse your body of nicotine. Drink enough water and fluids to keep your urine clear or pale yellow. Do this after quitting to flush the nicotine from your body.   Learn to predict your moods. Do not let a bad situation be your excuse to have a cigarette. Some situations in your life might tempt you into wanting a cigarette.   Never have "just one" cigarette. It leads to wanting another and another. Remind yourself of your decision to quit.   Change habits associated with smoking. If you smoked while driving or when feeling stressed, try other activities to replace smoking. Stand up when drinking your coffee. Brush your teeth after eating. Sit in a different chair when you read the paper. Avoid alcohol while trying to quit, and try to drink fewer caffeinated beverages. Alcohol and caffeine may urge you to smoke.   Avoid foods and drinks that can trigger a desire to smoke, such as sugary or spicy foods and alcohol.   Ask people who smoke not to smoke around you.   Have something planned to do right after eating or having a cup of coffee. Take a walk or exercise to perk you up. This will help to  keep you from overeating.   Try a relaxation exercise to calm you down and decrease your stress. Remember, you may be tense and nervous for the first 2 weeks after you quit, but this will pass.   Find new activities to keep your hands busy. Play with a pen, coin, or rubber band. Doodle or draw things on paper.   Brush your teeth right after eating. This will help cut down on the craving for the taste of tobacco after meals. You can try mouthwash, too.   Use oral substitutes, such as lemon drops, carrots, a cinnamon stick, or chewing gum, in place of cigarettes. Keep them handy so they are available when you have the urge to smoke.   When you have the urge to smoke, try deep breathing.   Designate your home as a nonsmoking area.   If you are a heavy smoker, ask your caregiver about a prescription for nicotine chewing gum. It can ease your withdrawal from nicotine.   Reward yourself. Set aside the cigarette money you save and buy yourself something nice.   Look for support from others. Join a support group or smoking cessation program. Ask someone at home or at work to help you with your plan to quit smoking.   Always ask yourself, "Do I need this cigarette or is this just a reflex?" Tell yourself, "Today, I choose not to smoke," or "I do not want to smoke." You are  reminding yourself of your decision to quit, even if you do smoke a cigarette.  HOW WILL I FEEL WHEN I QUIT SMOKING?  The benefits of not smoking start within days of quitting.   You may have symptoms of withdrawal because your body is used to nicotine (the addictive substance in cigarettes). You may crave cigarettes, be irritable, feel very hungry, cough often, get headaches, or have difficulty concentrating.   The withdrawal symptoms are only temporary. They are strongest when you first quit but will go away within 10 to 14 days.   When withdrawal symptoms occur, stay in control. Think about your reasons for quitting. Remind  yourself that these are signs that your body is healing and getting used to being without cigarettes.   Remember that withdrawal symptoms are easier to treat than the major diseases that smoking can cause.   Even after the withdrawal is over, expect periodic urges to smoke. However, these cravings are generally short-lived and will go away whether you smoke or not. Do not smoke!   If you relapse and smoke again, do not lose hope. Most smokers quit 3 times before they are successful.   If you relapse, do not give up! Plan ahead and think about what you will do the next time you get the urge to smoke.  LIFE AS A NONSMOKER: MAKE IT FOR A MONTH, MAKE IT FOR LIFE Day 1: Hang this page where you will see it every day. Day 2: Get rid of all ashtrays, matches, and lighters. Day 3: Drink water. Breathe deeply between sips. Day 4: Avoid places with smoke-filled air, such as bars, clubs, or the smoking section of restaurants. Day 5: Keep track of how much money you save by not smoking. Day 6: Avoid boredom. Keep a good book with you or go to the movies. Day 7: Reward yourself! One week without smoking! Day 8: Make a dental appointment to get your teeth cleaned. Day 9: Decide how you will turn down a cigarette before it is offered to you. Day 10: Review your reasons for quitting. Day 11: Distract yourself. Stay active to keep your mind off smoking and to relieve tension. Take a walk, exercise, read a book, do a crossword puzzle, or try a new hobby. Day 12: Exercise. Get off the bus before your stop or use stairs instead of escalators. Day 13: Call on friends for support and encouragement. Day 14: Reward yourself! Two weeks without smoking! Day 15: Practice deep breathing exercises. Day 16: Bet a friend that you can stay a nonsmoker. Day 17: Ask to sit in nonsmoking sections of restaurants. Day 18: Hang up "No Smoking" signs. Day 19: Think of yourself as a nonsmoker. Day 20: Each morning, tell  yourself you will not smoke. Day 21: Reward yourself! Three weeks without smoking! Day 22: Think of smoking in negative ways. Remember how it stains your teeth, gives you bad breath, and leaves you short of breath. Day 23: Eat a nutritious breakfast. Day 24:Do not relive your days as a smoker. Day 25: Hold a pencil in your hand when talking on the telephone. Day 26: Tell all your friends you do not smoke. Day 27: Think about how much better food tastes. Day 28: Remember, one cigarette is one too many. Day 29: Take up a hobby that will keep your hands busy. Day 30: Congratulations! One month without smoking! Give yourself a big reward. Your caregiver can direct you to community resources or hospitals for support, which  may include:  Group support.   Education.   Hypnosis.   Subliminal therapy.  Document Released: 09/05/2004 Document Revised: 08/20/2011 Document Reviewed: 09/24/2009 [facility] Patient Information 2012 [place], [place].Smoking Cessation This document explains the best ways for you to quit smoking and new treatments to help. It lists new medicines that can double or triple your chances of quitting and quitting for good. It also considers ways to avoid relapses and concerns you may have about quitting, including weight gain. NICOTINE: A POWERFUL ADDICTION If you have tried to quit smoking, you know how hard it can be. It is hard because nicotine is a very addictive drug. For some people, it can be as addictive as heroin or cocaine. Usually, people make 2 or 3 tries, or more, before finally being able to quit. Each time you try to quit, you can learn about what helps and what hurts. Quitting takes hard work and a lot of effort, but you can quit smoking. QUITTING SMOKING IS ONE OF THE MOST IMPORTANT THINGS YOU WILL EVER DO.  You will live longer, feel better, and live better.   The impact on your body of quitting smoking is felt almost immediately:   Within 20 minutes, blood  pressure decreases. Pulse returns to its normal level.   After 8 hours, carbon monoxide levels in the blood return to normal. Oxygen level increases.   After 24 hours, chance of heart attack starts to decrease. Breath, hair, and body stop smelling like smoke.   After 48 hours, damaged nerve endings begin to recover. Sense of taste and smell improve.   After 72 hours, the body is virtually free of nicotine. Bronchial tubes relax and breathing becomes easier.   After 2 to 12 weeks, lungs can hold more air. Exercise becomes easier and circulation improves.   Quitting will reduce your risk of having a heart attack, stroke, cancer, or lung disease:   After 1 year, the risk of coronary heart disease is cut in half.   After 5 years, the risk of stroke falls to the same as a nonsmoker.   After 10 years, the risk of lung cancer is cut in half and the risk of other cancers decreases significantly.   After 15 years, the risk of coronary heart disease drops, usually to the level of a nonsmoker.   If you are pregnant, quitting smoking will improve your chances of having a healthy baby.   The people you live with, especially your children, will be healthier.   You will have extra money to spend on things other than cigarettes.  FIVE KEYS TO QUITTING Studies have shown that these 5 steps will help you quit smoking and quit for good. You have the best chances of quitting if you use them together: 1. Get ready.  2. Get support and encouragement.  3. Learn new skills and behaviors.  4. Get medicine to reduce your nicotine addiction and use it correctly.  5. Be prepared for relapse or difficult situations. Be determined to continue trying to quit, even if you do not succeed at first.  1. GET READY  Set a quit date.   Change your environment.   Get rid of ALL cigarettes, ashtrays, matches, and lighters in your home, car, and place of work.   Do not let people smoke in your home.   Review your  past attempts to quit. Think about what worked and what did not.   Once you quit, do not smoke. NOT EVEN A PUFF!  2. GET SUPPORT AND ENCOURAGEMENT Studies have shown that you have a better chance of being successful if you have help. You can get support in many ways.  Tell your family, friends, and coworkers that you are going to quit and need their support. Ask them not to smoke around you.   Talk to your caregivers (doctor, dentist, nurse, pharmacist, psychologist, and/or smoking counselor).   Get individual, group, or telephone counseling and support. The more counseling you have, the better your chances are of quitting. Programs are available at [employer] and health centers. Call your local health department for information about programs in your area.   Spiritual beliefs and practices may help some smokers quit.   Quit meters are Photographer that keep track of quit statistics, such as amount of "quit-time," cigarettes not smoked, and money saved.   Many smokers find one or more of the many self-help books available useful in helping them quit and stay off tobacco.  3. LEARN NEW SKILLS AND BEHAVIORS  Try to distract yourself from urges to smoke. Talk to someone, go for a walk, or occupy your time with a task.   When you first try to quit, change your routine. Take a different route to work. Drink tea instead of coffee. Eat breakfast in a different place.   Do something to reduce your stress. Take a hot bath, exercise, or read a book.   Plan something enjoyable to do every day. Reward yourself for not smoking.   Explore interactive web-based programs that specialize in helping you quit.  4. GET MEDICINE AND USE IT CORRECTLY Medicines can help you stop smoking and decrease the urge to smoke. Combining medicine with the above behavioral methods and support can quadruple your chances of successfully quitting smoking. The U.S. Food and Drug  Administration (FDA) has approved 7 medicines to help you quit smoking. These medicines fall into 3 categories.  Nicotine replacement therapy (delivers nicotine to your body without the negative effects and risks of smoking):   Nicotine gum: Available over-the-counter.   Nicotine lozenges: Available over-the-counter.   Nicotine inhaler: Available by prescription.   Nicotine nasal spray: Available by prescription.   Nicotine skin patches (transdermal): Available by prescription and over-the-counter.   Antidepressant medicine (helps people abstain from smoking, but how this works is unknown):   Bupropion sustained-release (SR) tablets: Available by prescription.   Nicotinic receptor partial agonist (simulates the effect of nicotine in your brain):   Varenicline tartrate tablets: Available by prescription.   Ask your caregiver for advice about which medicines to use and how to use them. Carefully read the information on the package.   Everyone who is trying to quit may benefit from using a medicine. If you are pregnant or trying to become pregnant, nursing an infant, you are under age 35, or you smoke fewer than 10 cigarettes per day, talk to your caregiver before taking any nicotine replacement medicines.   You should stop using a nicotine replacement product and call your caregiver if you experience nausea, dizziness, weakness, vomiting, fast or irregular heartbeat, mouth problems with the lozenge or gum, or redness or swelling of the skin around the patch that does not go away.   Do not use any other product containing nicotine while using a nicotine replacement product.   Talk to your caregiver before using these products if you have diabetes, heart disease, asthma, stomach ulcers, you had a recent heart attack, you have high blood pressure  that is not controlled with medicine, a history of irregular heartbeat, or you have been prescribed medicine to help you quit smoking.  5. BE  PREPARED FOR RELAPSE OR DIFFICULT SITUATIONS  Most relapses occur within the first 3 months after quitting. Do not be discouraged if you start smoking again. Remember, most people try several times before they finally quit.   You may have symptoms of withdrawal because your body is used to nicotine. You may crave cigarettes, be irritable, feel very hungry, cough often, get headaches, or have difficulty concentrating.   The withdrawal symptoms are only temporary. They are strongest when you first quit, but they will go away within 10 to 14 days.  Here are some difficult situations to watch for:  Alcohol. Avoid drinking alcohol. Drinking lowers your chances of successfully quitting.   Caffeine. Try to reduce the amount of caffeine you consume. It also lowers your chances of successfully quitting.   Other smokers. Being around smoking can make you want to smoke. Avoid smokers.   Weight gain. Many smokers will gain weight when they quit, usually less than 10 pounds. Eat a healthy diet and stay active. Do not let weight gain distract you from your main goal, quitting smoking. Some medicines that help you quit smoking may also help delay weight gain. You can always lose the weight gained after you quit.   Bad mood or depression. There are a lot of ways to improve your mood other than smoking.  If you are having problems with any of these situations, talk to your caregiver. SPECIAL SITUATIONS AND CONDITIONS Studies suggest that everyone can quit smoking. Your situation or condition can give you a special reason to quit.  Pregnant women/new mothers: By quitting, you protect your baby's health and your own.   Hospitalized patients: By quitting, you reduce health problems and help healing.   Heart attack patients: By quitting, you reduce your risk of a second heart attack.   Lung, head, and neck cancer patients: By quitting, you reduce your chance of a second cancer.   Parents of children and  adolescents: By quitting, you protect your children from illnesses caused by secondhand smoke.  QUESTIONS TO THINK ABOUT Think about the following questions before you try to stop smoking. You may want to talk about your answers with your caregiver.  Why do you want to quit?   If you tried to quit in the past, what helped and what did not?   What will be the most difficult situations for you after you quit? How will you plan to handle them?   Who can help you through the tough times? Your family? Friends? Caregiver?   What pleasures do you get from smoking? What ways can you still get pleasure if you quit?  Here are some questions to ask your caregiver:  How can you help me to be successful at quitting?   What medicine do you think would be best for me and how should I take it?   What should I do if I need more help?   What is smoking withdrawal like? How can I get information on withdrawal?  Quitting takes hard work and a lot of effort, but you can quit smoking. FOR MORE INFORMATION  Smokefree.gov (http://www.davis-sullivan.com/) provides free, accurate, evidence-based information and professional assistance to help support the immediate and long-term needs of people trying to quit smoking. Document Released: 12/02/2001 Document Revised: 08/20/2011 Document Reviewed: 09/24/2009 [facility] Patient Information 2012 [place], [place].Bronchitis Bronchitis is  the body's way of reacting to injury and/or infection (inflammation) of the bronchi. Bronchi are the air tubes that extend from the windpipe into the lungs. If the inflammation becomes severe, it may cause shortness of breath. CAUSES  Inflammation may be caused by:  A virus.   Germs (bacteria).   Dust.   Allergens.   Pollutants and many other irritants.  The cells lining the bronchial tree are covered with tiny hairs (cilia). These constantly beat upward, away from the lungs, toward the mouth. This keeps the lungs free of pollutants.  When these cells become too irritated and are unable to do their job, mucus begins to develop. This causes the characteristic cough of bronchitis. The cough clears the lungs when the cilia are unable to do their job. Without either of these protective mechanisms, the mucus would settle in the lungs. Then you would develop pneumonia. Smoking is a common cause of bronchitis and can contribute to pneumonia. Stopping this habit is the single most important thing you can do to help yourself. TREATMENT   Your caregiver may prescribe an antibiotic if the cough is caused by bacteria. Also, medicines that open up your airways make it easier to breathe. Your caregiver may also recommend or prescribe an expectorant. It will loosen the mucus to be coughed up. Only take over-the-counter or prescription medicines for pain, discomfort, or fever as directed by your caregiver.   Removing whatever causes the problem (smoking, for example) is critical to preventing the problem from getting worse.   Cough suppressants may be prescribed for relief of cough symptoms.   Inhaled medicines may be prescribed to help with symptoms now and to help prevent problems from returning.   For those with recurrent (chronic) bronchitis, there may be a need for steroid medicines.  SEEK IMMEDIATE MEDICAL CARE IF:   During treatment, you develop more pus-like mucus (purulent sputum).   You have a fever.   Your baby is older than 3 months with a rectal temperature of 102 F (38.9 C) or higher.   Your baby is 29 months old or younger with a rectal temperature of 100.4 F (38 C) or higher.   You become progressively more ill.   You have increased difficulty breathing, wheezing, or shortness of breath.  It is necessary to seek immediate medical care if you are elderly or sick from any other disease. MAKE SURE YOU:   Understand these instructions.   Will watch your condition.   Will get help right away if you are not doing  well or get worse.  Document Released: 12/08/2005 Document Revised: 08/20/2011 Document Reviewed: 10/17/2008 [facility] Patient Information 2012 [place], [place].

## 2011-11-20 ENCOUNTER — Other Ambulatory Visit: Payer: Self-pay | Admitting: Family Medicine

## 2011-11-20 NOTE — Telephone Encounter (Signed)
If only exposed for a few hours then doesn't need prophylactic treatment.

## 2011-11-20 NOTE — Telephone Encounter (Signed)
Pt calls and states she was exposed to whooping cough by her nephew on 17th of this month. Was around him for a few hours. Pt seen yesterday by Dr. Thurmond Butts and treated her for asthma and allergies. Please advise

## 2011-11-22 ENCOUNTER — Other Ambulatory Visit: Payer: Self-pay | Admitting: Family Medicine

## 2011-12-09 ENCOUNTER — Telehealth: Payer: Self-pay | Admitting: *Deleted

## 2011-12-09 NOTE — Telephone Encounter (Signed)
Fluid restrict to 50 oucnes  Day and no salt in diet and if not getting better then needs to be seen.

## 2011-12-09 NOTE — Telephone Encounter (Signed)
Mom calls and states pt is swelling really bad. Face legs, feet. Been taking 2 fluid pills a day and not helping.Not on Predinisone right now been off for 4 days. SOB at times. Please advise

## 2011-12-09 NOTE — Telephone Encounter (Signed)
Pt notified. KJ LPN 

## 2011-12-18 ENCOUNTER — Other Ambulatory Visit: Payer: Self-pay | Admitting: Family Medicine

## 2011-12-23 HISTORY — PX: LUMBAR SPINE SURGERY: SHX701

## 2011-12-26 ENCOUNTER — Other Ambulatory Visit: Payer: Self-pay | Admitting: Family Medicine

## 2012-01-01 ENCOUNTER — Encounter: Payer: Self-pay | Admitting: Family Medicine

## 2012-01-01 ENCOUNTER — Ambulatory Visit (INDEPENDENT_AMBULATORY_CARE_PROVIDER_SITE_OTHER): Payer: Medicare Other | Admitting: Family Medicine

## 2012-01-01 VITALS — BP 121/66 | HR 101 | Wt 278.0 lb

## 2012-01-01 DIAGNOSIS — E669 Obesity, unspecified: Secondary | ICD-10-CM

## 2012-01-01 DIAGNOSIS — L0390 Cellulitis, unspecified: Secondary | ICD-10-CM

## 2012-01-01 DIAGNOSIS — J45909 Unspecified asthma, uncomplicated: Secondary | ICD-10-CM

## 2012-01-01 DIAGNOSIS — L0291 Cutaneous abscess, unspecified: Secondary | ICD-10-CM

## 2012-01-01 DIAGNOSIS — R609 Edema, unspecified: Secondary | ICD-10-CM

## 2012-01-01 DIAGNOSIS — J449 Chronic obstructive pulmonary disease, unspecified: Secondary | ICD-10-CM

## 2012-01-01 DIAGNOSIS — E65 Localized adiposity: Secondary | ICD-10-CM

## 2012-01-01 MED ORDER — ALBUTEROL SULFATE (2.5 MG/3ML) 0.083% IN NEBU
2.5000 mg | INHALATION_SOLUTION | Freq: Four times a day (QID) | RESPIRATORY_TRACT | Status: DC | PRN
  Administered 2012-01-01: 2.5 mg via RESPIRATORY_TRACT

## 2012-01-01 MED ORDER — DOXYCYCLINE HYCLATE 100 MG PO TABS: 100.0000 mg | ORAL_TABLET | Freq: Two times a day (BID) | ORAL | Status: AC

## 2012-01-01 NOTE — Progress Notes (Signed)
  Subjective:    Patient ID: Krystal Burgess, female    DOB: 1961/06/28, 50 y.o.   MRN: [id number]  HPI Abscesses  LE swelling  bilat that started about 3-4 weeks ago. Had to increase her lasix.  Didn't take one today because of hte house.    Review of Systems     Objective:   Physical Exam  Constitutional: She is oriented to person, place, and time. She appears well-developed and well-nourished.  HENT:  Head: Normocephalic and atraumatic.  Cardiovascular: Normal rate, regular rhythm and normal heart sounds.   Pulmonary/Chest: Effort normal and breath sounds normal.       Dec BS diffusely.   Musculoskeletal:       1+ bilat ankle edema.   Neurological: She is alert and oriented to person, place, and time.  Skin: Skin is warm and dry.       She has a healing abscess on her right breast and 2 on her left breast. One looks ulcerated.  She also has a few erythematous papules under her pannus. Buffalo hump on the back of the neck.  Moon facies.  Psychiatric: She has a normal mood and affect. Her behavior is normal.          Assessment & Plan:  Abscesses - Will tx with doxy. Call if not better in one week. Will refer her to derm.  She wants to see who is in her network and will call me.    Asthma- Peak flow in the yellow. Given neb. Repeat peak flow in the yellow. See if getting better with getting dec fluid off.  F/u on Monday.    Ankle edema - Inc lasix to 2 tabs for the next couple of days. Decrease fluid intake to 60 ounces a day and eat a very low salt diet.   Buffalo hump- Suspect Cushings. Brother w/ adrenal deficiency.  Discussed testing her for this.

## 2012-01-01 NOTE — Patient Instructions (Addendum)
Decrease fluid intake to 60 ounces a day and eat a very low salt diet.  Follow up on Monday.

## 2012-01-02 ENCOUNTER — Telehealth: Payer: Self-pay | Admitting: Family Medicine

## 2012-01-02 LAB — BASIC METABOLIC PANEL
CO2: 29 mEq/L (ref 19–32)
Calcium: 9.3 mg/dL (ref 8.4–10.5)
Glucose, Bld: 95 mg/dL (ref 70–99)
Potassium: 4.4 mEq/L (ref 3.5–5.3)
Sodium: 139 mEq/L (ref 135–145)

## 2012-01-02 MED ORDER — DEXAMETHASONE 1 MG PO TABS: 1.0000 mg | ORAL_TABLET | Freq: Two times a day (BID) | ORAL | Status: AC

## 2012-01-02 NOTE — Telephone Encounter (Signed)
i sent an order to lab for urinary cortisol. Seh will need to go by to pick up the kit and they will give her instructions. I also sent a tab of dexamethasone to the phamacy.  Once she has completed the urine test then call and I will give her instructions about the pill i sent to the phamacy.

## 2012-01-02 NOTE — Telephone Encounter (Signed)
L/m for pt to return call

## 2012-01-05 ENCOUNTER — Ambulatory Visit: Payer: Medicare Other | Admitting: Family Medicine

## 2012-01-15 ENCOUNTER — Ambulatory Visit (INDEPENDENT_AMBULATORY_CARE_PROVIDER_SITE_OTHER): Payer: Medicare Other | Admitting: Family Medicine

## 2012-01-15 ENCOUNTER — Encounter: Payer: Self-pay | Admitting: Family Medicine

## 2012-01-15 VITALS — BP 153/83 | HR 89 | Ht 60.0 in | Wt 271.0 lb

## 2012-01-15 DIAGNOSIS — J441 Chronic obstructive pulmonary disease with (acute) exacerbation: Secondary | ICD-10-CM

## 2012-01-15 DIAGNOSIS — J449 Chronic obstructive pulmonary disease, unspecified: Secondary | ICD-10-CM

## 2012-01-15 DIAGNOSIS — Z1231 Encounter for screening mammogram for malignant neoplasm of breast: Secondary | ICD-10-CM

## 2012-01-15 DIAGNOSIS — R5381 Other malaise: Secondary | ICD-10-CM

## 2012-01-15 DIAGNOSIS — F32A Depression, unspecified: Secondary | ICD-10-CM

## 2012-01-15 DIAGNOSIS — F3289 Other specified depressive episodes: Secondary | ICD-10-CM

## 2012-01-15 DIAGNOSIS — R5383 Other fatigue: Secondary | ICD-10-CM

## 2012-01-15 DIAGNOSIS — F329 Major depressive disorder, single episode, unspecified: Secondary | ICD-10-CM

## 2012-01-15 DIAGNOSIS — Z720 Tobacco use: Secondary | ICD-10-CM

## 2012-01-15 DIAGNOSIS — F17200 Nicotine dependence, unspecified, uncomplicated: Secondary | ICD-10-CM

## 2012-01-15 MED ORDER — DULOXETINE HCL 30 MG PO CPEP: 90.0000 mg | ORAL_CAPSULE | Freq: Every day | ORAL | Status: DC

## 2012-01-15 MED ORDER — AMBULATORY NON FORMULARY MEDICATION: Status: DC

## 2012-01-15 NOTE — Progress Notes (Signed)
Subjective:    Patient ID: Krystal Burgess, female    DOB: Mar 29, 1961, 51 y.o.   MRN: [id number]  HPI COPD - I saw her about week and a half ago. She was initially supposed to follow up on Monday but on Sunday, the day before, and she started feeling worse. She said she was only getting out of bed to get treatment. She currently gets her short of breath she went to the emergency room. Was hospitalized for COPD ecaxerbation and bronchtis. k she paced her on BiPAP for a short period time. They also released her on oral steroids. She is still smoking.  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  Has tried the patches.  Tried the chantix and was able to get down to a few cigs pe rday. Has been very stressed and depressed. On prednisone 20mg  for 3 more days.  Completed ABX as well. Currently she's feeling much better. Her shortness of breath is much improved. She still gets a little winded with activity. She was also discharged home on oxygen but says she's been maintaining above 90% since she's been home. She says she plans to buy her own pulse oximeter. She is also still around pets and animals which also aggravates her allergies.   Review of Systems     Objective:   Physical Exam  Constitutional: She is oriented to person, place, and time. She appears well-developed and well-nourished.  HENT:  Head: Normocephalic and atraumatic.  Right Ear: External ear normal.  Left Ear: External ear normal.  Nose: Nose normal.  Mouth/Throat: Oropharynx is clear and moist.       TMs and canals are clear.   Eyes: Conjunctivae and EOM are normal. Pupils are equal, round, and reactive to light.  Neck: Neck supple. No thyromegaly present.  Cardiovascular: Normal rate, regular rhythm and normal heart sounds.   Pulmonary/Chest: Effort normal and breath sounds normal. She has no wheezes.  Lymphadenopathy:    She has no cervical adenopathy.  Neurological: She is alert and oriented to person, place, and time.  Skin: Skin is warm and  dry.  Psychiatric: She has a normal mood and affect.          Assessment & Plan:  COPD exacerbation-  May need to be on more long term steroids. SHe is in here almost every month with an exacerbation.  We discussed smoking cessatoin is key.  She says she feels she needs upper antidepressant and to her emotions under better control before she's able to quit. I think this is absolutely reasonable. We will increase her Cymbalta to 90 mg I'll see her back in 2-3 weeks. In the meantime I do recommend that she follow back up with her pulmonologist, Dr. Enid Skeens to see what the next steps are to keep her asthma under better control. We discussed that clearly smoking cessation and staying away from triggers such as animal dander, etc. would greatly reduce her risk of exacerbations. She is well aware of this.  Tobacco abuse-she knows she needs to quit but she is not ready to quit. We worked on increasing her antidepressant and see if this helps. We can also consider retrying the Chantix and she tolerated it well in the past without any significant mood change. We also discussed going back to nicotine replacement as an option.   Depression-increase Cymbalta to 90 mg. Followup in 2-3 weeks to see how she's doing on this. She also uses Xanax.  I also met her she is due for her  Pap smear as well as her mammogram.

## 2012-01-15 NOTE — Patient Instructions (Signed)
Take the dexamethasone tablet at 11:30 PM. Then come in for labwork at 8 AM the next day.

## 2012-01-18 ENCOUNTER — Other Ambulatory Visit: Payer: Self-pay | Admitting: Family Medicine

## 2012-01-19 ENCOUNTER — Telehealth: Payer: Self-pay | Admitting: *Deleted

## 2012-01-19 DIAGNOSIS — J45901 Unspecified asthma with (acute) exacerbation: Secondary | ICD-10-CM

## 2012-01-19 DIAGNOSIS — J441 Chronic obstructive pulmonary disease with (acute) exacerbation: Secondary | ICD-10-CM

## 2012-01-19 NOTE — Telephone Encounter (Signed)
Pt states that there was an order given to [facility] for a portable nebulizer. States [facility] told her that her insurance wouldn't cover it and tore it up but states that insurance will cover it if you would send a letter to [employer], Attn: Tabitha stating that it is medically necessary and that it needs to state that it needs to be for a battery operated. Pt states that she has to take it everywhere she goes.

## 2012-01-20 ENCOUNTER — Encounter: Payer: Self-pay | Admitting: Family Medicine

## 2012-01-20 NOTE — Telephone Encounter (Signed)
I printed her letter. Please fax to [facility].

## 2012-01-21 NOTE — Telephone Encounter (Signed)
Pt notified and has not done the test yet

## 2012-01-21 NOTE — Telephone Encounter (Signed)
Faxed by Loleta Rose.

## 2012-01-26 ENCOUNTER — Other Ambulatory Visit: Payer: Self-pay | Admitting: *Deleted

## 2012-01-28 ENCOUNTER — Other Ambulatory Visit: Payer: Self-pay | Admitting: Family Medicine

## 2012-02-03 ENCOUNTER — Inpatient Hospital Stay: Admission: RE | Admit: 2012-02-03 | Payer: Medicare Other | Source: Ambulatory Visit

## 2012-02-03 ENCOUNTER — Encounter: Payer: Self-pay | Admitting: Physician Assistant

## 2012-02-03 ENCOUNTER — Ambulatory Visit (INDEPENDENT_AMBULATORY_CARE_PROVIDER_SITE_OTHER): Payer: Medicare Other | Admitting: Physician Assistant

## 2012-02-03 VITALS — BP 122/62 | HR 111 | Temp 98.2°F | Wt 271.0 lb

## 2012-02-03 DIAGNOSIS — R0602 Shortness of breath: Secondary | ICD-10-CM

## 2012-02-03 DIAGNOSIS — F17200 Nicotine dependence, unspecified, uncomplicated: Secondary | ICD-10-CM

## 2012-02-03 DIAGNOSIS — E538 Deficiency of other specified B group vitamins: Secondary | ICD-10-CM

## 2012-02-03 DIAGNOSIS — J441 Chronic obstructive pulmonary disease with (acute) exacerbation: Secondary | ICD-10-CM

## 2012-02-03 DIAGNOSIS — Z720 Tobacco use: Secondary | ICD-10-CM

## 2012-02-03 MED ORDER — PREDNISONE 20 MG PO TABS: ORAL_TABLET | ORAL | Status: DC

## 2012-02-03 MED ORDER — METHYLPREDNISOLONE ACETATE 80 MG/ML IJ SUSP
80.0000 mg | Freq: Once | INTRAMUSCULAR | Status: AC
  Administered 2012-02-03: 80 mg via INTRAMUSCULAR

## 2012-02-03 MED ORDER — DOXYCYCLINE HYCLATE 100 MG PO TABS: 100.0000 mg | ORAL_TABLET | Freq: Two times a day (BID) | ORAL | Status: AC

## 2012-02-03 NOTE — Progress Notes (Signed)
  Subjective:    Patient ID: Krystal Burgess, female    DOB: April 04, 1961, 51 y.o.   MRN: [id number]  HPI Patient presents to the clinic for shortness of breath and wheezing. Patient has a history of COPD/asthma. She is on multiple inhalers and uses oxygen via nasal cannula at night. 6 days ago she noticed that she is having increasing difficulty breathing. She has started to use her oxygen not only at night but during the day. She's been using her albuterol inhaler or nebulizer every 2-4 hours. She does have a cough with production of brown mucus. She denies a fever, chills, nausea or vomiting. She has had a sinus headache and lots of sinus congestion. She wheezes throughout the day at rest and with exertion. She continues to smoke. She reports that she does have changed but has not started pack. She has been hospitalized multiple times for exacerbation of pulmonary function.  Review of Systems     Objective:   Physical Exam  Constitutional: She is oriented to person, place, and time. She appears well-developed and well-nourished.       Obese  HENT:  Head: Normocephalic and atraumatic.  Right Ear: External ear normal.  Left Ear: External ear normal.       Bilateral turbinates edematous and erythematous. No maxillary tenderness. Oropharynx with post nasal drip no exudate.  Eyes: Conjunctivae are normal.  Neck: Normal range of motion. Neck supple.  Cardiovascular: Regular rhythm and normal heart sounds.        Tachycardia at 111.  Pulmonary/Chest:       Decrease effort. Patient not able to take deep breath. Wheezing throughout all lung fields bilaterally. Coarse breath sounds heard. SpO2- 90% without O2.  Neurological: She is alert and oriented to person, place, and time.  Skin: Skin is warm and dry.  Psychiatric: She has a normal mood and affect. Her behavior is normal.          Assessment & Plan:  COPD/Asthma exacerbation/[place] abuse- Depo medrol 80mg  IM. Prednisone 40mg  for 7  days and 20mg  for 7 days. Doxycycline 100mg  BID for 10 days. Talked to patient extensively about stopping smoking. She has chantix but wants to get feeling better before she stops. She is aware that her lungs and problems that she is having smoking is only making worse.   B12 deficiency- Given B12 shot in office.  Patient canceled mammogram scheduled for today because of illness. Patient is aware of importance of pap smear and mammogram. Patient reports she will reschedule mammogram and come in for CPE after illness resolved.

## 2012-02-03 NOTE — Patient Instructions (Signed)
Continue on inhaler and oxygen. Start prednisone and Doxycycline for 10 days. Given IM depo medrol. Call office if not improving in 48 hours.

## 2012-02-17 ENCOUNTER — Inpatient Hospital Stay: Admission: RE | Admit: 2012-02-17 | Payer: Medicare Other | Source: Ambulatory Visit

## 2012-02-17 DIAGNOSIS — J441 Chronic obstructive pulmonary disease with (acute) exacerbation: Secondary | ICD-10-CM

## 2012-02-17 DIAGNOSIS — J45901 Unspecified asthma with (acute) exacerbation: Secondary | ICD-10-CM

## 2012-02-22 ENCOUNTER — Other Ambulatory Visit: Payer: Self-pay | Admitting: Family Medicine

## 2012-03-04 ENCOUNTER — Telehealth: Payer: Self-pay | Admitting: *Deleted

## 2012-03-04 NOTE — Telephone Encounter (Signed)
Carney Bern nurse with [facility] calls and states in with pt for visit and she has been sick for last 3-4 days and she started taking steroids and Doxycycline 100mg  once a day and not helping much. States lung sounds are diminished in the back, no wheezing heard, very tight. O2 is 95% on room air. Please advise

## 2012-03-04 NOTE — Telephone Encounter (Signed)
May need ot go to ED.

## 2012-03-04 NOTE — Telephone Encounter (Signed)
Pt notified and will go if not feeling better

## 2012-03-09 ENCOUNTER — Encounter: Payer: Self-pay | Admitting: Family Medicine

## 2012-03-09 ENCOUNTER — Ambulatory Visit (INDEPENDENT_AMBULATORY_CARE_PROVIDER_SITE_OTHER): Payer: Medicare Other | Admitting: Family Medicine

## 2012-03-09 VITALS — BP 134/72 | HR 101 | Temp 98.4°F | Ht 60.0 in | Wt 268.0 lb

## 2012-03-09 DIAGNOSIS — J441 Chronic obstructive pulmonary disease with (acute) exacerbation: Secondary | ICD-10-CM

## 2012-03-09 DIAGNOSIS — J45901 Unspecified asthma with (acute) exacerbation: Secondary | ICD-10-CM

## 2012-03-09 MED ORDER — PREDNISONE 10 MG PO TABS: 10.0000 mg | ORAL_TABLET | Freq: Every day | ORAL | Status: DC

## 2012-03-09 MED ORDER — ROFLUMILAST 500 MCG PO TABS: 500.0000 ug | ORAL_TABLET | Freq: Every day | ORAL | Status: DC

## 2012-03-09 MED ORDER — METHYLPREDNISOLONE ACETATE PF 80 MG/ML IJ SUSP
80.0000 mg | Freq: Once | INTRAMUSCULAR | Status: AC
  Administered 2012-03-09: 80 mg via INTRAMUSCULAR

## 2012-03-09 NOTE — Progress Notes (Signed)
  Subjective:    Patient ID: Krystal Burgess, female    DOB: [birth year], 51 y.o.   MRN: [id number]  HPI She is here to followup on her asthma/COPD. She has a home health nurse coming out more concerned about her lung sounds and asked her to followup here. She admits that she has had a slight increase in shortness of breath but denies any recent cold or bronchitis-type symptoms. She feels better than she does most times that she comes in to our office. She denies any fevers. She's been taking inhalers and medications regulated. She says that she always feels better on the steroids her when she's been off for about 3 or 4 days she starts to feel short of breath again. She still is around a lot of secondary smoke and a lot of external triggers, living at her mother's house.   Review of Systems     Objective:   Physical Exam  Constitutional: She is oriented to person, place, and time. She appears well-developed and well-nourished.  HENT:  Head: Normocephalic and atraumatic.  Right Ear: External ear normal.  Left Ear: External ear normal.  Nose: Nose normal.  Mouth/Throat: Oropharynx is clear and moist.       TMs and canals are clear.   Eyes: Conjunctivae and EOM are normal. Pupils are equal, round, and reactive to light.  Neck: Neck supple. No thyromegaly present.  Cardiovascular: Normal rate, regular rhythm and normal heart sounds.   Pulmonary/Chest: Effort normal and breath sounds normal. She has no wheezes.       Inspiratory and expiratory wheezing.  Lymphadenopathy:    She has no cervical adenopathy.  Neurological: She is alert and oriented to person, place, and time.  Skin: Skin is warm and dry.  Psychiatric: She has a normal mood and affect.          Assessment & Plan:  Asthma/COPD exacerbation without acute bacterial illness - she was given Depo-Medrol 80 mg IM today. I'll put her on a low-dose course of 10 mg of prednisone daily. Given 30 tabs until she can get back in with  her pulmonologist, Dr. Enid Skeens. I strongly encouraged her to get back in with him because I do think she may end up requiring low-dose chronic steroids. She did well with Daliresp in the past but her insurance would not cover it. She has  First this year so will send a new prescription and see if it is better covered. I gave her 6 weeks worth of samples. I really think this has made a difference in helping keep her out of the hospital when she was able to take it regularly. Also encouraged her to check with her insurance company. I would happy to letter if needed if that would help her get better coverage . Cleraly  the cost savings are significant if it keeps her out of the hospital and emergency room.

## 2012-03-09 NOTE — Progress Notes (Signed)
Addended by: Avon Gully C on: 03/09/2012 05:00 PM   Modules accepted: Orders

## 2012-03-18 ENCOUNTER — Encounter: Payer: Self-pay | Admitting: Family Medicine

## 2012-03-18 ENCOUNTER — Telehealth: Payer: Self-pay | Admitting: Family Medicine

## 2012-03-18 NOTE — Telephone Encounter (Signed)
Patient and please let her know that we would endeavor t her insurance company to see if we can get the daliresp covered.

## 2012-03-24 NOTE — Telephone Encounter (Signed)
Left message on vm

## 2012-03-25 ENCOUNTER — Other Ambulatory Visit: Payer: Self-pay | Admitting: Family Medicine

## 2012-03-26 ENCOUNTER — Other Ambulatory Visit: Payer: Self-pay | Admitting: Family Medicine

## 2012-03-31 ENCOUNTER — Other Ambulatory Visit: Payer: Self-pay | Admitting: Family Medicine

## 2012-04-06 ENCOUNTER — Telehealth: Payer: Self-pay | Admitting: *Deleted

## 2012-04-06 NOTE — Telephone Encounter (Signed)
[facility] called and states they had been trying to reach the pt for over a week and when they got to her home today she informed them that she didn't need them to come out for her.  FYI

## 2012-04-09 DIAGNOSIS — J441 Chronic obstructive pulmonary disease with (acute) exacerbation: Secondary | ICD-10-CM

## 2012-04-09 DIAGNOSIS — J45901 Unspecified asthma with (acute) exacerbation: Secondary | ICD-10-CM

## 2012-04-14 ENCOUNTER — Other Ambulatory Visit: Payer: Self-pay | Admitting: Family Medicine

## 2012-04-27 ENCOUNTER — Other Ambulatory Visit: Payer: Self-pay | Admitting: Family Medicine

## 2012-04-30 ENCOUNTER — Ambulatory Visit (INDEPENDENT_AMBULATORY_CARE_PROVIDER_SITE_OTHER): Payer: Medicare Other | Admitting: Family Medicine

## 2012-04-30 ENCOUNTER — Encounter: Payer: Self-pay | Admitting: Family Medicine

## 2012-04-30 VITALS — BP 123/69 | HR 105 | Ht 60.0 in | Wt 270.0 lb

## 2012-04-30 DIAGNOSIS — R1310 Dysphagia, unspecified: Secondary | ICD-10-CM

## 2012-04-30 DIAGNOSIS — R7301 Impaired fasting glucose: Secondary | ICD-10-CM

## 2012-04-30 DIAGNOSIS — J4489 Other specified chronic obstructive pulmonary disease: Secondary | ICD-10-CM

## 2012-04-30 DIAGNOSIS — J449 Chronic obstructive pulmonary disease, unspecified: Secondary | ICD-10-CM

## 2012-04-30 DIAGNOSIS — J45909 Unspecified asthma, uncomplicated: Secondary | ICD-10-CM

## 2012-04-30 MED ORDER — ALBUTEROL SULFATE (2.5 MG/3ML) 0.083% IN NEBU
2.5000 mg | INHALATION_SOLUTION | Freq: Four times a day (QID) | RESPIRATORY_TRACT | Status: DC | PRN
  Administered 2012-04-30 – 2012-07-13 (×2): 2.5 mg via RESPIRATORY_TRACT

## 2012-04-30 MED ORDER — FLUCONAZOLE 150 MG PO TABS: 150.0000 mg | ORAL_TABLET | Freq: Once | ORAL | Status: AC

## 2012-04-30 MED ORDER — ALPRAZOLAM 1 MG PO TABS: 1.0000 mg | ORAL_TABLET | Freq: Three times a day (TID) | ORAL | Status: DC | PRN

## 2012-04-30 MED ORDER — QUETIAPINE FUMARATE ER 300 MG PO TB24: 300.0000 mg | ORAL_TABLET | Freq: Every day | ORAL | Status: DC

## 2012-04-30 MED ORDER — PREDNISONE 5 MG PO TABS: ORAL_TABLET | ORAL | Status: DC

## 2012-04-30 NOTE — Progress Notes (Signed)
Subjective:    Patient ID: Krystal Burgess, female    DOB: 1961-04-24, 51 y.o.   MRN: [id number]  HPI Wheezing started 2-3 days.  Cough with clear phlegm.  Says has allergies this time of year. No other cold-type symptoms. She does have mild sore throat the first couple days but that is a little better.  Sensation of choking for 2-3 months. Says drinks sound like they are gurgling.  Brother with throat cancer. No dysphagia.  Burping a lot. Hx of hiatal hernia. She eats a lot greasy/spicey. Relief with belching.  Mild ST the last couple of days.  She is now smoking 3ppd. Taking a PPI  She says her pulmonologist wanted her on chronic steroids. She was supposed to get a sleep study and has not yet because of her recent back pain she was unable to make it. They also want her to get a bone density scan that was unable to make that as well. She says she plans to reschedule him after she gets her back to dress. She has an appointment soon with the orthopedic surgeon for possible surgery for her back.  Review of Systems     Objective:   Physical Exam  Constitutional: She is oriented to person, place, and time. She appears well-developed and well-nourished.       Moon facies, morbidly obese.   HENT:  Head: Normocephalic and atraumatic.  Right Ear: External ear normal.  Left Ear: External ear normal.  Nose: Nose normal.  Mouth/Throat: Oropharynx is clear and moist.       TMs and canals are clear.   Eyes: Conjunctivae and EOM are normal. Pupils are equal, round, and reactive to light.  Neck: Neck supple. No thyromegaly present.  Cardiovascular: Normal rate, regular rhythm and normal heart sounds.   Pulmonary/Chest: Effort normal. She has wheezes.       Very poor air movement. Unable to hear breath sounds posteriorly but I can hear a slight expiratory wheeze anteriorly.  Lymphadenopathy:    She has no cervical adenopathy.  Neurological: She is alert and oriented to person, place, and time.    Skin: Skin is warm and dry.  Psychiatric: She has a normal mood and affect.          Assessment & Plan:  Asthma/COPD exacerbation-we will restart her steroids at a low dose 10 mg daily for one week and intra-abdominal 5 mg daily. She sees her nebulizer machine liberally. She was only on that after nebulizer treatment here today. She is to call if she feels like she is getting worse or develops a fever and consider antibiotic treatment at that time.  She has not been out of make an appointment with endocrinologist. We made a referral for her recently for suspicion of adrenal insufficiency and possible Cushing's. She says she will try schedule this the summer.  Depression she says she's been really stressed lately. She started taking 90 mg of Cymbalta and 200 mg of Seroquel. We'll go ahead and increase her Seroquel to 300 mg. She says she's been very agitated and in easy. She says her nerves are shot being around her mother, she was in her mother's home currently. Followup in 6-8 weeks.  IFG- hemoglobin A1c was 6.2 today. Discussed that she needs to work on cutting back on carbohydrates, concentrated sweets and try to get some regular exercise as tolerated. We'll recheck in 4 months.  Given print out of information on diet.    Dysphasia-will go ahead and  make referral to gastroneurology for further evaluation. She may have a candidal infection since she does use inhaled steroids chronically. I will have incentive her a prescription for Diflucan 150 mg x1 tablet to see if this improves her symptoms or not.

## 2012-04-30 NOTE — Patient Instructions (Signed)
Diets for Diabetes, Food Labeling Look at food labels to help you decide how much of a product you can eat. You will want to check the amount of total carbohydrate in a serving to see how the food fits into your meal plan. In the list of ingredients, the ingredient present in the largest amount by weight must be listed first, followed by the other ingredients in descending order. STANDARD OF IDENTITY Most products have a list of ingredients. However, foods that the Food and Drug Administration (FDA) has given a standard of identity do not need a list of ingredients. A standard of identity means that a food must contain certain ingredients if it is called a particular name. Examples are mayonnaise, peanut butter, ketchup, jelly, and cheese. LABELING TERMS There are many terms found on food labels. Some of these terms have specific definitions. Some terms are regulated by the FDA, and the FDA has clearly specified how they can be used. Others are not regulated or well-defined and can be misleading and confusing. SPECIFICALLY DEFINED TERMS Nutritive Sweetener.  A sweetener that contains calories,such as table sugar or honey.  Nonnutritive Sweetener.  A sweetener with few or no calories,such as saccharin, aspartame, sucralose, and cyclamate.  LABELING TERMS REGULATED BY THE FDA Free.  The product contains only a tiny or small amount of fat, cholesterol, sodium, sugar, or calories. For example, a "fat-free" product will contain less than 0.5 g of fat per serving.  Low.  A food described as "low" in fat, saturated fat, cholesterol, sodium, or calories could be eaten fairly often without exceeding dietary guidelines. For example, "low in fat" means no more than 3 g of fat per serving.  Lean.  "Lean" and "extra lean" are U.S. Department of Agriculture Scientist, research (physical sciences)) terms for use on meat and poultry products. "Lean" means the product contains less than 10 g of fat, 4 g of saturated fat, and 95 mg of  cholesterol per serving. "Lean" is not as low in fat as a product labeled "low."  Extra Lean.  "Extra lean" means the product contains less than 5 g of fat, 2 g of saturated fat, and 95 mg of cholesterol per serving. While "extra lean" has less fat than "lean," it is still higher in fat than a product labeled "low."  Reduced, Less, Fewer.  A diet product that contains 25% less of a nutrient or calories than the regular version. For example, hot dogs might be labeled "25% less fat than our regular hot dogs."  Light/Lite.  A diet product that contains ? fewer calories or  the fat of the original. For example, "light in sodium" means a product with  the usual sodium.  More.  One serving contains at least 10% more of the daily value of a vitamin, mineral, or fiber than usual.  Good Source Of.  One serving contains 10% to 19% of the daily value for a particular vitamin, mineral, or fiber.  Excellent Source Of.  One serving contains 20% or more of the daily value for a particular nutrient. Other terms used might be "high in" or "rich in."  Enriched or Fortified.  The product contains added vitamins, minerals, or protein. Nutrition labeling must be used on enriched or fortified foods.  Imitation.  The product has been altered so that it is lower in protein, vitamins, or minerals than the usual food,such as imitation peanut butter.  Total Fat.  The number listed is the total of all fat found in a serving  of the product. Under total fat, food labels must list saturated fat and trans fat, which are associated with raising bad cholesterol and an increased risk of heart blood vessel disease.  Saturated Fat.  Mainly fats from animal-based sources. Some examples are red meat, cheese, cream, whole milk, and coconut oil.  Trans Fat.  Found in some fried snack foods, packaged foods, and fried restaurant foods. It is recommended you eat as close to 0 g of trans fat as possible, since it raises bad  cholesterol and lowers good cholesterol.  Polyunsaturated and Monounsaturated Fats.  More healthful fats. These fats are from plant sources.  Total Carbohydrate.  The number of carbohydrate grams in a serving of the product. Under total carbohydrate are listed the other carbohydrate sources, such as dietary fiber and sugars.  Dietary Fiber.  A carbohydrate from plant sources.  Sugars.  Sugars listed on the label contain all naturally occurring sugars as well as added sugars.  LABELING TERMS NOT REGULATED BY THE FDA Sugarless.  Table sugar (sucrose) has not been added. However, the manufacturer may use another form of sugar in place of sucrose to sweeten the product. For example, sugar alcohols are used to sweeten foods. Sugar alcohols are a form of sugar but are not table sugar. If a product contains sugar alcohols in place of sucrose, it can still be labeled "sugarless."  Low Salt, Salt-Free, Unsalted, No Salt, No Salt Added, Without Added Salt.  Food that is usually processed with salt has been made without salt. However, the food may contain sodium-containing additives, such as preservatives, leavening agents, or flavorings.  Natural.  This term has no legal meaning.  Organic.  Foods that are certified as organic have been inspected and approved by the USDA to ensure they are produced without pesticides, fertilizers containing synthetic ingredients, bioengineering, or ionizing radiation.  Document Released: 12/11/2003 Document Revised: 11/27/2011 Document Reviewed: 06/28/2009 [employer] Patient Information 2012 [employer]1800 Calorie Diabetic Diet The 1800 calorie diabetic diet is designed for eating up to 1800 calories each day. Following this diet and making healthy meal choices can help improve overall health. It controls blood glucose (sugar) levels, and it can also help lower blood pressure and cholesterol. SERVING SIZES Measuring foods and serving sizes helps to make sure  you are getting the right amount of food. The list below tells how big or small some common serving sizes are:  1 oz.........4 stacked dice.   3 oz........Marland KitchenDeck of cards.   1 tsp.......Marland KitchenTip of little finger.   1 tbs......Marland KitchenMarland KitchenThumb.   2 tbs.......Marland KitchenGolf ball.    cup......Marland KitchenHalf of a fist.   1 cup.......Marland KitchenA fist.  GUIDELINES FOR CHOOSING FOODS The goal of this diet is to eat a variety of foods and limit calories to 1800 each day. This can be done by choosing foods that are low in calories and fat. The diet also suggests eating small amounts of food frequently. Doing this helps control your blood glucose levels so they do not get too high or too low. Each meal or snack may include a protein food source to help you feel more satisfied. Try to eat about the same amount of food around the same time each day. This includes weekend days, travel days, and days off work. Space your meals about 4 to 5 hours apart, and add a snack between them, if you wish.   For example, a daily food plan could include breakfast, a morning snack, lunch, dinner, and an evening snack. Healthy meals and  snacks have different types of foods, including whole grains, vegetables, fruits, lean meats, poultry, fish, and dairy products. As you plan your meals, select a variety of foods. Choose from the bread and starch, vegetable, fruit, dairy, and meat/protein groups. Examples of foods from each group are listed below with their suggested serving sizes. Use measuring cups and spoons to become familiar with what a healthy portion looks like. Bread and Starch Each serving equals 15 grams of carbohydrates.  1 slice bread.    bagel.    cup cold cereal (unsweetened).    cup hot cereal or mashed potatoes.   1 small potato (size of a computer mouse).   ? cup cooked pasta or rice.    English muffin.   1 cup broth-based soup.   3 cups of popcorn.   4 to 6 whole-wheat crackers.    cup cooked beans, peas, or corn.    Vegetables Each serving equals 5 grams of carbohydrates.   cup cooked vegetables.   1 cup raw vegetables.    cup tomato or vegetable juice.  Fruit Each serving equals 15 grams of carbohydrates.  1 small apple or orange.   1  cup watermelon or strawberries.    cup applesauce (no sugar added).   2 tbs raisins.    banana.    cup canned fruit, packed in water or in its own juice.    cup unsweetened fruit juice.  Dairy Each serving equals 12 to 15 grams of carbohydrates.  1 cup fat-free milk.   6 oz artificially sweetened yogurt or plain yogurt.   1 cup low-fat buttermilk.   1 cup soy milk.   1 cup almond milk.  Meat/Protein  1 large egg.   2 to 3 oz meat, poultry, or fish.    cup low-fat cottage cheese.   1 tbs peanut butter.   1 oz low-fat cheese.    cup tuna, packed in water.    cup tofu.  Fat  1 tsp oil.   1 tsp trans-fat-free margarine.   1 tsp butter.   1 tsp mayonnaise.   2 tbs avocado.   1 tbs salad dressing.   1 tbs cream cheese.   2 tbs sour cream.  SAMPLE 1800 CALORIE DIET PLAN Breakfast   cup unsweetened cereal (1 carb serving).   1 cup fat-free milk (1 carb serving).   1 slice whole-wheat toast (1 carb serving).    small banana (1 carb serving).   1 scrambled egg.   1 tsp trans-fat-free margarine.  Lunch  Tuna sandwich.   2 slices whole-wheat bread (2 carb servings).    cup canned tuna in water, drained.   1 tbs reduced fat mayonnaise.   1 stalk celery, chopped.   2 slices tomato.   1 lettuce leaf.   1 cup carrot sticks.   24 to 30 seedless grapes (2 carb servings).   6 oz light yogurt (1 carb serving).  Afternoon Snack  3 graham cracker squares (1 carb serving).   1 cup fat-free milk (1 carb serving).   1 tbs peanut butter.  Dinner  3 oz salmon, broiled with 1 tsp oil.   1 cup mashed potatoes (2 carb servings) with 1 tsp trans-fat-free margarine.   1 cup fresh or frozen green  beans.   1 cup steamed asparagus.   1 cup fat-free milk (1 carb serving).  Evening Snack  3 cups of air-popped popcorn (1 carb serving).   2 tbs Parmesan cheese.  Meal Plan You can use this worksheet to help you make a daily meal plan based on the 1800 calorie diabetic diet suggestions. If you are using this plan to help you control your blood glucose, you may interchange carbohydrate-containing foods (dairy, starches, and fruits). Select a variety of fresh foods of varying colors and flavors. The total amount of carbohydrate in your meals or snacks is more important than making sure you include all of the food groups every time you eat. Choose from the approximate amount of the following foods to build your day's meals:  8 Starches.   4 Vegetables.   3 Fruits.   2 Dairy.   6 to 7 oz Meat/Protein.   Up to 4 Fats.  Your dietician can use this worksheet to help you decide how many servings and which types of foods are right for you. BREAKFAST Food Group and Servings / Food Choice Starches _______________________________________________________ Dairy __________________________________________________________ Fruit ___________________________________________________________ Meat/Protein ____________________________________________________ Fat ____________________________________________________________ LUNCH Food Group and Servings / Food Choice Starch _________________________________________________________ Meat/Protein ___________________________________________________ Vegetables _____________________________________________________ Fruit __________________________________________________________ Dairy __________________________________________________________ Fat ____________________________________________________________ Aura Fey Food Group and Servings / Food Choice Starch ________________________________________________________ Meat/Protein  ___________________________________________________ Fruit __________________________________________________________ Dairy __________________________________________________________ Laural Golden Food Group and Servings / Food Choice Starches _______________________________________________________ Meat/Protein ___________________________________________________ Dairy __________________________________________________________ Vegetable ______________________________________________________ Fruit ___________________________________________________________ Fat ____________________________________________________________ Lollie Sails Food Group and Servings / Food Choice Fruit __________________________________________________________ Meat/Protein ___________________________________________________ Dairy __________________________________________________________ Starch _________________________________________________________ DAILY TOTALS Starches _________________________ Vegetables _______________________ Fruits ____________________________ Dairy ____________________________ Meat/Protein_____________________ Fats _____________________________ Document Released: 06/30/2005 Document Revised: 11/27/2011 Document Reviewed: 10/24/2011 ExitCare Patient Information 2012 [place], [place].

## 2012-05-29 ENCOUNTER — Other Ambulatory Visit: Payer: Self-pay | Admitting: Family Medicine

## 2012-05-31 ENCOUNTER — Encounter: Payer: Self-pay | Admitting: Family Medicine

## 2012-05-31 ENCOUNTER — Ambulatory Visit (INDEPENDENT_AMBULATORY_CARE_PROVIDER_SITE_OTHER): Payer: Medicare Other | Admitting: Family Medicine

## 2012-05-31 VITALS — BP 131/71 | HR 104 | Ht 60.0 in | Wt 269.0 lb

## 2012-05-31 DIAGNOSIS — Z800 Family history of malignant neoplasm of digestive organs: Secondary | ICD-10-CM

## 2012-05-31 DIAGNOSIS — R0989 Other specified symptoms and signs involving the circulatory and respiratory systems: Secondary | ICD-10-CM

## 2012-05-31 DIAGNOSIS — F419 Anxiety disorder, unspecified: Secondary | ICD-10-CM

## 2012-05-31 DIAGNOSIS — F17200 Nicotine dependence, unspecified, uncomplicated: Secondary | ICD-10-CM

## 2012-05-31 DIAGNOSIS — F411 Generalized anxiety disorder: Secondary | ICD-10-CM

## 2012-05-31 DIAGNOSIS — J45909 Unspecified asthma, uncomplicated: Secondary | ICD-10-CM

## 2012-05-31 DIAGNOSIS — J449 Chronic obstructive pulmonary disease, unspecified: Secondary | ICD-10-CM

## 2012-05-31 DIAGNOSIS — S43499A Other sprain of unspecified shoulder joint, initial encounter: Secondary | ICD-10-CM

## 2012-05-31 DIAGNOSIS — F458 Other somatoform disorders: Secondary | ICD-10-CM

## 2012-05-31 DIAGNOSIS — S46819A Strain of other muscles, fascia and tendons at shoulder and upper arm level, unspecified arm, initial encounter: Secondary | ICD-10-CM

## 2012-05-31 NOTE — Progress Notes (Addendum)
Subjective:    Patient ID: Krystal Burgess, female    DOB: 1961-08-16, 51 y.o.   MRN: [id number]  HPI Has had a choking sensation in her throat for several months.  But lately has been every time she eats. Chokes on her medicine sometimes. Sometime will hear a gurling trying to get down.   No fever.  Throat has been burning and hurting.  Has had a lot of congestion, and then coughing it up int he AM.  Mother was recnelty dx with throat cancer.  Says her nerves pill doesn't help but says the diflucan did help.    She is also on omeprazole twice a day. Her brother also has throat cancer and is currently on hospice. Both of them are heavy smokers. The patient herself is a heavy smoker.  Anxiety - we increased her Seroquel at her last office visit. She does feel that this is working, unfortunately the news of her mother being diagnosed with throat cancer has really but a wrench in things. Up until the point that she fell at the adjustments were working well.  Asthma-she's had to start using her nebulized treatments every 2-4 hours in the last few days. She is actually currently on 5 mg of prednisone per her neurologist. She is feeling like her chest is tight and having some increased shortness of breath. She still having very thick productive sputum.  Her allergies are aggravating her symptoms as well.  She's also had some pain around her right shoulder and below her shoulder blade. It started after she changed [place] that she sits at a computer on. He does sit at a different height but she says she's tried adjusting her chair. It feels sore to touch and she wondered if it could be scar tissue from a lipoma that she had removed at one time. No worsening or alleviating symptoms. She's not taking any medications for it.  Review of Systems     Objective:   Physical Exam  Constitutional: She is oriented to person, place, and time. She appears well-developed and well-nourished.  HENT:  Head: Normocephalic  and atraumatic.  Right Ear: External ear normal.  Left Ear: External ear normal.  Nose: Nose normal.  Mouth/Throat: Oropharynx is clear and moist.       TMs and canals are clear.   Eyes: Conjunctivae and EOM are normal. Pupils are equal, round, and reactive to light.  Neck: Neck supple. No thyromegaly present.  Cardiovascular: Normal rate, regular rhythm and normal heart sounds.   Pulmonary/Chest: Effort normal and breath sounds normal. She has no wheezes.       Decreased breath sounds diffusely. No wheezing. She's very tight on exam today.  Musculoskeletal:       Right shoulder with normal range of motion. She's tender over the upper shoulder down the trapezius and along the inferior edge of the scapula. There is a scar where her lipoma versus removed but it appears to be well-healed.  Lymphadenopathy:    She has no cervical adenopathy.  Neurological: She is alert and oriented to person, place, and time.  Skin: Skin is warm and dry.       She has a [place] home with telangiectasias.  Psychiatric: She has a normal mood and affect.          Assessment & Plan:  Globus sensation   - Will tx with oral Diflucan, as she is at very high risk of oral candidiasis and thrush because of her frequent inhaler  use. I refer her to ENT for further evaluation workup especially with 2 family members with primary throat cancer. Encourage smoking cessation.  Anxiety -PHQ- 9 score of 20. (she feels a lot of her depression sxs are from her back pain).  she is happy with her current regimen and doesn't want to change it. We will continue with the Seroquel 300 mg extended release as well as her Cymbalta. Followup in one month.  Asthma-I will put her on a course of Levaquin 500mg  QD z 7 days,  and prednisone 40 mg for 7 days then drop down to 20 mg for 7 days. Call if she's not significantly better in one week. I still think she has adrenal insufficiency, possibly from chronic steroid use. She still has not  gone for further evaluation with endocrinologist. Continue neb treatments every 2-4 hours as needed. Get to hospital if suddenly gets worse.  Right trapezius pain-likely musculoskeletal secondary to change in position while working on the computer. She does usually use her right arm enhancement of her mouse work on the computer. We discussed looking at the ergonomics of her desk and chair and try to make some changes to see if this helps. Also work on stretches and can use a heating pad if needed.

## 2012-06-01 MED ORDER — METHYLPREDNISOLONE ACETATE 80 MG/ML IJ SUSP
80.0000 mg | Freq: Once | INTRAMUSCULAR | Status: AC
  Administered 2012-06-01: 80 mg via INTRAMUSCULAR

## 2012-06-01 NOTE — Progress Notes (Signed)
Addended by: Wyline Beady on: 06/01/2012 08:31 AM   Modules accepted: Orders

## 2012-06-02 ENCOUNTER — Other Ambulatory Visit: Payer: Self-pay | Admitting: Family Medicine

## 2012-06-03 NOTE — Telephone Encounter (Signed)
Are you ok with this Diflucan for this patient

## 2012-06-29 ENCOUNTER — Other Ambulatory Visit: Payer: Self-pay | Admitting: Family Medicine

## 2012-06-30 ENCOUNTER — Other Ambulatory Visit: Payer: Self-pay | Admitting: Family Medicine

## 2012-07-05 ENCOUNTER — Other Ambulatory Visit: Payer: Self-pay | Admitting: *Deleted

## 2012-07-05 MED ORDER — FUROSEMIDE 20 MG PO TABS: ORAL_TABLET | ORAL | Status: DC

## 2012-07-05 MED ORDER — OMEPRAZOLE 20 MG PO CPDR: 20.0000 mg | DELAYED_RELEASE_CAPSULE | Freq: Two times a day (BID) | ORAL | Status: DC

## 2012-07-13 ENCOUNTER — Encounter: Payer: Self-pay | Admitting: Family Medicine

## 2012-07-13 ENCOUNTER — Ambulatory Visit (INDEPENDENT_AMBULATORY_CARE_PROVIDER_SITE_OTHER): Payer: Medicare Other | Admitting: Family Medicine

## 2012-07-13 VITALS — BP 149/78 | HR 115 | Temp 98.4°F | Ht 60.0 in | Wt 279.0 lb

## 2012-07-13 DIAGNOSIS — J45901 Unspecified asthma with (acute) exacerbation: Secondary | ICD-10-CM

## 2012-07-13 DIAGNOSIS — F17200 Nicotine dependence, unspecified, uncomplicated: Secondary | ICD-10-CM

## 2012-07-13 DIAGNOSIS — Z720 Tobacco use: Secondary | ICD-10-CM

## 2012-07-13 DIAGNOSIS — D510 Vitamin B12 deficiency anemia due to intrinsic factor deficiency: Secondary | ICD-10-CM

## 2012-07-13 DIAGNOSIS — T17308A Unspecified foreign body in larynx causing other injury, initial encounter: Secondary | ICD-10-CM

## 2012-07-13 MED ORDER — PREDNISONE 20 MG PO TABS: ORAL_TABLET | ORAL | Status: DC

## 2012-07-13 MED ORDER — METHYLPREDNISOLONE ACETATE 80 MG/ML IJ SUSP
80.0000 mg | Freq: Once | INTRAMUSCULAR | Status: AC
  Administered 2012-07-13: 80 mg via INTRAMUSCULAR

## 2012-07-13 MED ORDER — AZITHROMYCIN 250 MG PO TABS: ORAL_TABLET | ORAL | Status: AC

## 2012-07-13 MED ORDER — CYANOCOBALAMIN 1000 MCG/ML IJ SOLN
1000.0000 ug | Freq: Once | INTRAMUSCULAR | Status: AC
  Administered 2012-07-13: 1000 ug via INTRAMUSCULAR

## 2012-07-13 MED ORDER — ALBUTEROL SULFATE (2.5 MG/3ML) 0.083% IN NEBU
2.5000 mg | INHALATION_SOLUTION | Freq: Once | RESPIRATORY_TRACT | Status: AC
  Administered 2012-07-13: 2.5 mg via RESPIRATORY_TRACT

## 2012-07-13 NOTE — Progress Notes (Signed)
  Subjective:    Patient ID: Krystal Burgess, female    DOB: 1961-07-21, 51 y.o.   MRN: [id number]  HPI Asthma-she started getting more short of breath and wheezing over the weekend. They were working on the yard and she had several family members that were covered in bacitracin: In the house. She thinks it aggravated her allergies and asthma. She did do a nebulizer treatment around 8:30 this morning, about 3 hours ago. She still smokes. She admits he's been smoking more lately. Her brother passed away last week. He had recurrent esophageal cancer from smoking 3 packs a day.     Review of Systems     Objective:   Physical Exam  Constitutional: She is oriented to person, place, and time. She appears well-developed and well-nourished.  HENT:  Head: Normocephalic and atraumatic.  Right Ear: External ear normal.  Left Ear: External ear normal.  Nose: Nose normal.  Mouth/Throat: Oropharynx is clear and moist.       TMs and canals are clear.   Eyes: Conjunctivae and EOM are normal. Pupils are equal, round, and reactive to light.  Neck: Neck supple. No thyromegaly present.  Cardiovascular: Normal rate, regular rhythm and normal heart sounds.   Pulmonary/Chest: Effort normal and breath sounds normal. She has no wheezes.  Lymphadenopathy:    She has no cervical adenopathy.  Neurological: She is alert and oriented to person, place, and time.  Skin: Skin is warm and dry.  Psychiatric: She has a normal mood and affect.          Assessment & Plan:  Asthma exacerbation-peak flows in the red zone. Nebulizer treatment given x 2. Depo-Medrol 80 mg IM administered. F/U in 1 mo. after 4 days she can start the oral prednisone. The prescription to her pharmacy for a 15 day taper. Go to ED if suddenly gets worse.   Dysphasia-she is still having problems with choking. We had initially referred her to ENT but says she prefers to get [facility]. We will make another referral.  Vitamin B12  deficiency-injection given today.  Sinusitis-will treat with azithromycin. She did have a course of doxycycline and about 6 weeks ago. Call if sinuses are not significantly better one to 2 weeks. Discussed need for smoking cessation is clearly irritated her sinuses as well.

## 2012-07-13 NOTE — Patient Instructions (Signed)
Given Nebs every 4 hours.   Start the oral prednisone in 4 days. STOP smoking!

## 2012-07-27 ENCOUNTER — Other Ambulatory Visit: Payer: Self-pay | Admitting: Family Medicine

## 2012-07-30 ENCOUNTER — Other Ambulatory Visit: Payer: Self-pay | Admitting: Family Medicine

## 2012-08-17 ENCOUNTER — Telehealth: Payer: Self-pay | Admitting: Family Medicine

## 2012-08-17 ENCOUNTER — Ambulatory Visit: Payer: Medicare Other | Admitting: Family Medicine

## 2012-08-17 ENCOUNTER — Encounter: Payer: Self-pay | Admitting: Family Medicine

## 2012-08-17 ENCOUNTER — Ambulatory Visit (INDEPENDENT_AMBULATORY_CARE_PROVIDER_SITE_OTHER): Payer: Medicare Other | Admitting: Family Medicine

## 2012-08-17 VITALS — BP 149/84 | HR 100 | Wt 279.0 lb

## 2012-08-17 DIAGNOSIS — I10 Essential (primary) hypertension: Secondary | ICD-10-CM

## 2012-08-17 DIAGNOSIS — J309 Allergic rhinitis, unspecified: Secondary | ICD-10-CM

## 2012-08-17 DIAGNOSIS — J3081 Allergic rhinitis due to animal (cat) (dog) hair and dander: Secondary | ICD-10-CM

## 2012-08-17 DIAGNOSIS — J45909 Unspecified asthma, uncomplicated: Secondary | ICD-10-CM

## 2012-08-17 MED ORDER — ALPRAZOLAM 1 MG PO TABS: 1.0000 mg | ORAL_TABLET | Freq: Three times a day (TID) | ORAL | Status: DC | PRN

## 2012-08-17 MED ORDER — ZOLPIDEM TARTRATE 10 MG PO TABS: 5.0000 mg | ORAL_TABLET | Freq: Every evening | ORAL | Status: DC | PRN

## 2012-08-17 MED ORDER — LOSARTAN POTASSIUM 100 MG PO TABS: 100.0000 mg | ORAL_TABLET | Freq: Every day | ORAL | Status: DC

## 2012-08-17 NOTE — Patient Instructions (Signed)
Try the dulera instead of your Symbicort.  2 puffs twice a day.

## 2012-08-17 NOTE — Progress Notes (Signed)
  Subjective:    Patient ID: Krystal Burgess, female    DOB: 09/17/1961, 51 y.o.   MRN: [id number]  HPI She's here to followup for asthma. Unfortunately still around cats and dogs which she has an allergy to. She says her cough is much improved. She's not having any fever. No productive sputum. She still feels short of breath with activities. No recent wheezing her last neb treatment there was a ride for her office visit. She doesn't feel completely better because of shortness of breath with activities. She just doesn't want to get worse. She's down to 5 mg of prednisone daily. She says she did feel much better when she was doing her allergy shots and is interested in restarting meds. She does have her followup appointment with ENT next week.  She continues to smoke the most 2 packs per day.   Review of Systems     Objective:   Physical Exam  Constitutional: She is oriented to person, place, and time. She appears well-developed and well-nourished.  HENT:  Head: Normocephalic and atraumatic.  Right Ear: External ear normal.  Left Ear: External ear normal.  Nose: Nose normal.  Mouth/Throat: Oropharynx is clear and moist.       TMs and canals are clear.   Eyes: Conjunctivae and EOM are normal. Pupils are equal, round, and reactive to light.  Neck: Neck supple. No thyromegaly present.  Cardiovascular: Normal rate, regular rhythm and normal heart sounds.   Pulmonary/Chest: Effort normal and breath sounds normal. She has no wheezes.  Lymphadenopathy:    She has no cervical adenopathy.  Neurological: She is alert and oriented to person, place, and time.  Skin: Skin is warm and dry.  Psychiatric: She has a normal mood and affect.          Assessment & Plan:  Asthma-I. do think she is somewhat better today. Though not great. I would like to try to wear instead of the Symbicort. Just for trial. These medications are very similar but sometimes you benefit or feel better on one versus  another. Continued otherwise current regimen. She has had side effects with Singulair. She is off of all oral antihistamines because she feels they do not help her situation at all. She does continue nasal spray which does seem to help. We will make referral to asthma and allergies are to her back in. She may benefit from the immunotherapy injections. She is to CP not allergy and we'll refer her back there. Peak flow in the yellow zone on today which is actually very good for her. If she feels she's getting worse please call the office and I will bump her prednisone back up. Followup in 3 weeks. I explained her that at this point I don't know what else much more I can do for her. We'll issue needs to quit smoking and she really needs to be able to get away from them the allergens including dogs and cats that she owns. Unfortunately she's not willing to make these changes quite yet. She says she will work on cutting back on her smoking. Until she's willing to do that, she is going to continue to have persistent exacerbations. Could consider changing Spiriva to tudorza.  AR - See above.   Hypertension-Increase losartan to 100mg . recehck BP in one months.

## 2012-08-17 NOTE — Telephone Encounter (Signed)
Please call patient learn I did increase her blood pressure pill since the last 2 times she has been here her blood pressure has not been well controlled. I sent over new prescription for losartan 100 mg instead of the 50 that she was previously taking.

## 2012-08-19 NOTE — Telephone Encounter (Signed)
Left message on pt.'s vm.

## 2012-08-26 ENCOUNTER — Other Ambulatory Visit: Payer: Self-pay | Admitting: Family Medicine

## 2012-08-30 ENCOUNTER — Other Ambulatory Visit: Payer: Self-pay | Admitting: Family Medicine

## 2012-09-06 ENCOUNTER — Other Ambulatory Visit: Payer: Self-pay | Admitting: Family Medicine

## 2012-09-07 ENCOUNTER — Telehealth: Payer: Self-pay | Admitting: *Deleted

## 2012-09-07 ENCOUNTER — Encounter: Payer: Self-pay | Admitting: Family Medicine

## 2012-09-07 ENCOUNTER — Ambulatory Visit (INDEPENDENT_AMBULATORY_CARE_PROVIDER_SITE_OTHER): Payer: Medicare Other | Admitting: Family Medicine

## 2012-09-07 VITALS — BP 151/92 | HR 107 | Wt 281.0 lb

## 2012-09-07 DIAGNOSIS — Z23 Encounter for immunization: Secondary | ICD-10-CM

## 2012-09-07 DIAGNOSIS — E538 Deficiency of other specified B group vitamins: Secondary | ICD-10-CM

## 2012-09-07 DIAGNOSIS — I10 Essential (primary) hypertension: Secondary | ICD-10-CM

## 2012-09-07 DIAGNOSIS — J329 Chronic sinusitis, unspecified: Secondary | ICD-10-CM

## 2012-09-07 MED ORDER — AMOXICILLIN-POT CLAVULANATE 875-125 MG PO TABS: 1.0000 | ORAL_TABLET | Freq: Two times a day (BID) | ORAL | Status: AC

## 2012-09-07 MED ORDER — FLUTICASONE PROPIONATE 50 MCG/ACT NA SUSP: NASAL | Status: DC

## 2012-09-07 MED ORDER — CYANOCOBALAMIN 1000 MCG/ML IJ SOLN
1000.0000 ug | Freq: Once | INTRAMUSCULAR | Status: AC
  Administered 2012-09-07: 1000 ug via INTRAMUSCULAR

## 2012-09-07 MED ORDER — MOMETASONE FURO-FORMOTEROL FUM 200-5 MCG/ACT IN AERO: 2.0000 | INHALATION_SPRAY | Freq: Two times a day (BID) | RESPIRATORY_TRACT | Status: DC

## 2012-09-07 NOTE — Telephone Encounter (Signed)
Sorry; Rx sent. 

## 2012-09-07 NOTE — Progress Notes (Addendum)
  Subjective:    Patient ID: Krystal Burgess, female    DOB: [birth year], 51 y.o.   MRN: [id number]  HPI She's had sinus pain and congestion for well over a week. She has thick nasal green drainage. No fevers. Some mild pressure over the face and headache. She does have asthma but actually says her lungs actually been doing okay. Her followup with the allergist is in the next couple weeks. She has been taking her allergy medications as well as her nasal steroid spray regularly. She says she has to use a Q-tip to clean her nose out because the discharge is so thick.  Hypertension-she is still taking the 50 mg and has not started the 100 mg yet. She has picked up the prescription.  B12 deficiency-she would like an injection today.   Review of Systems     Objective:   Physical Exam  Constitutional: She is oriented to person, place, and time. She appears well-developed and well-nourished.  HENT:  Head: Normocephalic and atraumatic.  Right Ear: External ear normal.  Left Ear: External ear normal.  Nose: Nose normal.  Mouth/Throat: Oropharynx is clear and moist.       TMs and canals are clear. Positive for thick white nasal discharge.  Eyes: Conjunctivae normal and EOM are normal. Pupils are equal, round, and reactive to light.  Neck: Neck supple. No thyromegaly present.  Cardiovascular: Normal rate, regular rhythm and normal heart sounds.   Pulmonary/Chest: Effort normal and breath sounds normal. She has no wheezes.  Lymphadenopathy:    She has no cervical adenopathy.  Neurological: She is alert and oriented to person, place, and time.  Skin: Skin is warm and dry.  Psychiatric: She has a normal mood and affect.          Assessment & Plan:  Sinusitis-will treat with Augmentin for 14 days. Call if she's starting to get any respiratory symptoms or she feels her asthma is being exacerbated. Her lung exam is actually clear today. Have a low threshold for treating her with steroids if  needed. She is probably one of my worst asthmatics. Flu vaccine updated today.  Asthma - Using duoneb with her neb machine daily for control.  Needed for control. Call if feels getting worse and needs round of steroids.   B 12 deficiency-B12 injection given today.  Hypertension-I. did let her note she is to take 100 mg tab. She says her pharmacy actually filled the 50s in the 100s and so she was confused about which want to take. Followup in one month to recheck blood pressure.

## 2012-09-07 NOTE — Patient Instructions (Signed)

## 2012-09-07 NOTE — Telephone Encounter (Signed)
Pt states that you were going to send an rx for [facility] to her pharmacy. Please advise.

## 2012-09-08 NOTE — Telephone Encounter (Signed)
LM on VM.

## 2012-09-11 ENCOUNTER — Other Ambulatory Visit: Payer: Self-pay | Admitting: Family Medicine

## 2012-09-13 ENCOUNTER — Telehealth: Payer: Self-pay | Admitting: *Deleted

## 2012-09-13 MED ORDER — AZITHROMYCIN 250 MG PO TABS: ORAL_TABLET | ORAL | Status: DC

## 2012-09-13 NOTE — Telephone Encounter (Signed)
Sent a prescription for Z-Pak. Call if not getting better by the end of the week.

## 2012-09-13 NOTE — Telephone Encounter (Signed)
Pt has called stating that she feels worse and that the cold has went into her chest. She states you told her you could give her something else if it got worse. Please advise.

## 2012-09-13 NOTE — Telephone Encounter (Signed)
LMOM

## 2012-10-07 ENCOUNTER — Telehealth: Payer: Self-pay | Admitting: *Deleted

## 2012-10-12 ENCOUNTER — Other Ambulatory Visit: Payer: Self-pay | Admitting: Family Medicine

## 2012-10-13 ENCOUNTER — Other Ambulatory Visit: Payer: Self-pay | Admitting: Family Medicine

## 2012-10-14 ENCOUNTER — Telehealth: Payer: Self-pay | Admitting: *Deleted

## 2012-10-14 NOTE — Telephone Encounter (Signed)
[name] We talked last week about an addendum for her insurance/pharm coverage, I don't see the letter. They called again today

## 2012-10-14 NOTE — Telephone Encounter (Signed)
Is the prednisone ok

## 2012-10-17 NOTE — Telephone Encounter (Signed)
I just added addendum to note from 9/17 about using duoneb for her asthma with her neb machine.  I thinkt that was all they needed.

## 2012-10-18 ENCOUNTER — Other Ambulatory Visit: Payer: Self-pay | Admitting: Family Medicine

## 2012-10-18 NOTE — Telephone Encounter (Signed)
Faxed information to [facility] at [phone]

## 2012-11-02 ENCOUNTER — Other Ambulatory Visit: Payer: Self-pay | Admitting: Family Medicine

## 2012-11-03 NOTE — Telephone Encounter (Signed)
See other message

## 2012-11-14 ENCOUNTER — Other Ambulatory Visit: Payer: Self-pay | Admitting: Family Medicine

## 2012-11-15 ENCOUNTER — Other Ambulatory Visit: Payer: Self-pay | Admitting: Family Medicine

## 2012-11-16 ENCOUNTER — Other Ambulatory Visit: Payer: Self-pay | Admitting: Family Medicine

## 2012-11-28 ENCOUNTER — Other Ambulatory Visit: Payer: Self-pay | Admitting: Family Medicine

## 2012-11-30 ENCOUNTER — Other Ambulatory Visit: Payer: Self-pay | Admitting: Family Medicine

## 2012-12-07 ENCOUNTER — Ambulatory Visit: Payer: Medicare Other | Admitting: Family Medicine

## 2012-12-14 ENCOUNTER — Other Ambulatory Visit: Payer: Self-pay | Admitting: Family Medicine

## 2012-12-16 ENCOUNTER — Other Ambulatory Visit: Payer: Self-pay | Admitting: *Deleted

## 2012-12-16 MED ORDER — ZOLPIDEM TARTRATE 10 MG PO TABS: 10.0000 mg | ORAL_TABLET | Freq: Every evening | ORAL | Status: DC | PRN

## 2012-12-17 ENCOUNTER — Other Ambulatory Visit: Payer: Self-pay | Admitting: Family Medicine

## 2012-12-30 ENCOUNTER — Telehealth: Payer: Self-pay

## 2012-12-30 NOTE — Telephone Encounter (Signed)
Patient was released from the hospital with [facility]. Tonya from [facility] states the patient is going to continue taking her Cymbalta and Seroquel as Dr Linford Arnold directed and not as directed by the hospital. The hospital decreased the medications stating she was over medicated. Please advise.

## 2012-12-30 NOTE — Telephone Encounter (Signed)
OK, lets make sure has hosp f/u appt scheduled with me.

## 2012-12-31 NOTE — Telephone Encounter (Signed)
Left detailed message.   

## 2013-01-01 ENCOUNTER — Other Ambulatory Visit: Payer: Self-pay | Admitting: Family Medicine

## 2013-01-05 NOTE — Telephone Encounter (Signed)
Patient is scheduled for the 39 th of January.

## 2013-01-06 ENCOUNTER — Encounter: Payer: Self-pay | Admitting: Family Medicine

## 2013-01-06 ENCOUNTER — Ambulatory Visit (INDEPENDENT_AMBULATORY_CARE_PROVIDER_SITE_OTHER): Payer: Medicare Other | Admitting: Family Medicine

## 2013-01-06 VITALS — BP 132/84 | HR 93 | Temp 98.2°F | Resp 18 | Wt 258.0 lb

## 2013-01-06 DIAGNOSIS — T380X5A Adverse effect of glucocorticoids and synthetic analogues, initial encounter: Secondary | ICD-10-CM | POA: Insufficient documentation

## 2013-01-06 DIAGNOSIS — M545 Low back pain, unspecified: Secondary | ICD-10-CM

## 2013-01-06 DIAGNOSIS — G589 Mononeuropathy, unspecified: Secondary | ICD-10-CM

## 2013-01-06 DIAGNOSIS — D510 Vitamin B12 deficiency anemia due to intrinsic factor deficiency: Secondary | ICD-10-CM

## 2013-01-06 DIAGNOSIS — F329 Major depressive disorder, single episode, unspecified: Secondary | ICD-10-CM

## 2013-01-06 DIAGNOSIS — J441 Chronic obstructive pulmonary disease with (acute) exacerbation: Secondary | ICD-10-CM

## 2013-01-06 DIAGNOSIS — F3289 Other specified depressive episodes: Secondary | ICD-10-CM

## 2013-01-06 DIAGNOSIS — J449 Chronic obstructive pulmonary disease, unspecified: Secondary | ICD-10-CM

## 2013-01-06 DIAGNOSIS — E139 Other specified diabetes mellitus without complications: Secondary | ICD-10-CM

## 2013-01-06 DIAGNOSIS — E099 Drug or chemical induced diabetes mellitus without complications: Secondary | ICD-10-CM

## 2013-01-06 DIAGNOSIS — J45909 Unspecified asthma, uncomplicated: Secondary | ICD-10-CM

## 2013-01-06 MED ORDER — PREDNISONE (PAK) 10 MG PO TABS: ORAL_TABLET | ORAL | Status: DC

## 2013-01-06 MED ORDER — CYANOCOBALAMIN 1000 MCG/ML IJ SOLN
1000.0000 ug | Freq: Once | INTRAMUSCULAR | Status: AC
  Administered 2013-01-06: 1000 ug via INTRAMUSCULAR

## 2013-01-06 MED ORDER — ZIPRASIDONE HCL 80 MG PO CAPS: 80.0000 mg | ORAL_CAPSULE | Freq: Two times a day (BID) | ORAL | Status: DC

## 2013-01-06 MED ORDER — CLOTRIMAZOLE 1 % EX CREA: TOPICAL_CREAM | Freq: Two times a day (BID) | CUTANEOUS | Status: DC

## 2013-01-06 MED ORDER — HYDROCODONE-ACETAMINOPHEN 10-325 MG PO TABS: 1.0000 | ORAL_TABLET | Freq: Three times a day (TID) | ORAL | Status: DC | PRN

## 2013-01-06 NOTE — Progress Notes (Signed)
Subjective:    Patient ID: Krystal Burgess, female    DOB: 28-Aug-1961, 52 y.o.   MRN: [id number]  HPI She was hospitalized for COPD exacerbation. Was in ICU for 2 nights. Was Bipap.  Then was in regular room for 3 day. Now she is back home and around the animals and around cig smoke. On prednisone 40mg  now. Completed levaquin.  They cut her back to on her cymbalta or seroquel doses because they felt she was too sedated. She admits that she does have days where hard to wake up but usually if didn't sleep well or if took her meds late. Says thte seroquel really helps her depression sister he has attempted to discontinue this medication. She also noticed a big difference in her pain control off of the Cymbalta. In fact after it was stopped she said she had so much pain and discomfort in her back that she was unable to sleep. The she did restart it when she was home.   Was dx withDM in hospital and started on  Metformin. Metformin is making her very nauseated.  She doesn't feel like eating. In fact she says she hasn't even eaten today. She has been on multiple courses of steroids in the last year. She denies any polyuria polydipsia. She does complain of a dry mouth today.   Review of Systems     Objective:   Physical Exam  Constitutional: She is oriented to person, place, and time. She appears well-developed and well-nourished.  HENT:  Head: Normocephalic and atraumatic.  Right Ear: External ear normal.  Left Ear: External ear normal.  Nose: Nose normal.  Mouth/Throat: Oropharynx is clear and moist.       TMs and canals are clear.   Eyes: Conjunctivae normal and EOM are normal. Pupils are equal, round, and reactive to light.  Neck: Neck supple. No thyromegaly present.  Cardiovascular: Normal rate, regular rhythm and normal heart sounds.   Pulmonary/Chest: Effort normal. She has wheezes.       Expiratory wheeze in the right lower lung.  Lymphadenopathy:    She has no cervical adenopathy.    Neurological: She is alert and oriented to person, place, and time.  Skin: Skin is warm and dry.  Psychiatric: She has a normal mood and affect.          Assessment & Plan:  COPD exacerbation.-Unfortunately she is continually around triggers including animals, strong perfumes and chemicals, and cigarette smoke. Overall I do think she is doing much better today. She still has a slight wheeze in the right lower lung. She has 2 more weeks of 40 mg prednisone. I. a prescription to the pharmacy for a two-week taper to start as soon as she completes the 2-40 mg penicillin. Followup in one month. Continue current regimen. She needs a new order for home nebulizer. She uses [facility].   Depression -Will try changing her to geodon to see if less sedation than seroquel. Followup in one month.  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 Unfortunately she has been on multiple courses of steroids in the last couple years because of her severe COPD and asthma. stop the metformin. Start on onglyza 5 mg daily. This should cause less GI upset and irritation. She can try the samples for a few weeks and if she's tolerating it well we can send prescription to her pharmacy. Followup in one month. We'll try to get a copy of her hemoglobin A1c from her hospital records.  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 She brought in her own bottle with her today they had expired so we gave her a B12 injection from our supply. Her last injection was 2 weeks ago. She gets them bimonthly.  Chronic back pain-am okay with her restarting the Cymbalta. I think provides a lot of pain relief from her. It also minimizes her need for narcotics which is important. I did give her prescription for hydrocodone today since she has not seen her back surgeon him as to year and has run out of the refills that he is given her. Her last refill was in December.

## 2013-01-06 NOTE — Patient Instructions (Signed)
Stop the metformin. Start on Onglyza and its place. Take once a day in the morning. Once you complete the prednisone 40 mg then he can start to taper. If you don't here from [facility] about your new nebulizer machine within the next week then please let us know. You can stop the Seroquel and try the Geodon in its place.

## 2013-01-12 ENCOUNTER — Telehealth: Payer: Self-pay | Admitting: *Deleted

## 2013-01-12 NOTE — Telephone Encounter (Signed)
There really isn't anything cheaper that we'll cover for MRSA. She may want to doublecheck that they were giving her the generic. Not sure why her insurance will cover it.

## 2013-01-12 NOTE — Telephone Encounter (Signed)
Pt calls and states that the cream you gave her for MRSA is not covered by her insurance and it cost 37.00 and wants to know if there is anything cheaper you can give her

## 2013-01-12 NOTE — Telephone Encounter (Signed)
Pt.notified

## 2013-01-13 ENCOUNTER — Other Ambulatory Visit: Payer: Self-pay

## 2013-01-15 ENCOUNTER — Other Ambulatory Visit: Payer: Self-pay | Admitting: Sports Medicine

## 2013-01-15 ENCOUNTER — Other Ambulatory Visit: Payer: Self-pay | Admitting: Family Medicine

## 2013-01-19 ENCOUNTER — Other Ambulatory Visit: Payer: Self-pay | Admitting: Family Medicine

## 2013-01-19 ENCOUNTER — Other Ambulatory Visit: Payer: Self-pay | Admitting: *Deleted

## 2013-01-19 MED ORDER — AMBULATORY NON FORMULARY MEDICATION: Status: DC

## 2013-01-21 ENCOUNTER — Other Ambulatory Visit: Payer: Self-pay | Admitting: Family Medicine

## 2013-01-21 ENCOUNTER — Other Ambulatory Visit: Payer: Self-pay | Admitting: Sports Medicine

## 2013-01-24 ENCOUNTER — Telehealth: Payer: Self-pay

## 2013-01-24 ENCOUNTER — Ambulatory Visit (INDEPENDENT_AMBULATORY_CARE_PROVIDER_SITE_OTHER): Payer: Medicare Other | Admitting: Family Medicine

## 2013-01-24 ENCOUNTER — Encounter: Payer: Self-pay | Admitting: Family Medicine

## 2013-01-24 DIAGNOSIS — J449 Chronic obstructive pulmonary disease, unspecified: Secondary | ICD-10-CM

## 2013-01-24 DIAGNOSIS — E039 Hypothyroidism, unspecified: Secondary | ICD-10-CM

## 2013-01-24 DIAGNOSIS — E538 Deficiency of other specified B group vitamins: Secondary | ICD-10-CM

## 2013-01-24 DIAGNOSIS — J329 Chronic sinusitis, unspecified: Secondary | ICD-10-CM

## 2013-01-24 DIAGNOSIS — F319 Bipolar disorder, unspecified: Secondary | ICD-10-CM

## 2013-01-24 DIAGNOSIS — R1310 Dysphagia, unspecified: Secondary | ICD-10-CM

## 2013-01-24 MED ORDER — LEVOTHYROXINE SODIUM 100 MCG PO TABS: 100.0000 ug | ORAL_TABLET | Freq: Every day | ORAL | Status: DC

## 2013-01-24 MED ORDER — OXYCODONE-ACETAMINOPHEN 5-325 MG PO TABS: 1.0000 | ORAL_TABLET | ORAL | Status: DC | PRN

## 2013-01-24 MED ORDER — CYANOCOBALAMIN 1000 MCG/ML IJ SOLN
1000.0000 ug | Freq: Once | INTRAMUSCULAR | Status: AC
  Administered 2013-01-24: 1000 ug via INTRAMUSCULAR

## 2013-01-24 NOTE — Telephone Encounter (Signed)
Mailed letter °

## 2013-01-24 NOTE — Telephone Encounter (Signed)
Krystal Burgess's insurance company no longer pay for 3 capsules of the Cymbalta. The most they will cover is Cymbalta 30 mg 2 capsules daily. Unable to reach patient through all numbers.

## 2013-01-24 NOTE — Progress Notes (Signed)
Subjective:    Patient ID: Krystal Burgess, female    DOB: [birth year], 52 y.o.   MRN: [id number]  HPI Dysphagia for months but getting worse. Says food and drink arbors getting stuck, too.  Says slowly getting worse.  Says the vicodin is getting hung. Says the percocets are round and go down better.  No prior history of esophageal problems or dilatation. There is a family history throat cancer. She is still smoking a lot recently. No abd pain. Feels like getting stuck in mid to lower neck area.   COPD/asthma-she is now off of her steroids and has been more jittery and shaky since then. Her stress level has increased and she has been smoking more. The she has really tried hard on her diet to drink more water and cut out some of the sodas and be a little bit more healthy with her dietary choices. Still no regular exercise.  Sinus pain and pressure-she still having a lot of pain across her sinuses. The she no she's allergic to cigarette smoke but still continues to smoke. She admits she's having some scabs and bleeding from the left nostril. She is continued to use her nasal steroid sprays.  Bipolar - Says she really like her new bipolar medication, geodon. We had weaned her off seroquel. . Says feel more relaxed overall. Less sedated in the AM and has been able to laugh and enjoy her duaghter's company more.    Review of Systems     Objective:   Physical Exam  Constitutional: She is oriented to person, place, and time. She appears well-developed and well-nourished.  HENT:  Head: Normocephalic and atraumatic.       Very thick neck  Neck: Neck supple. No thyromegaly present.  Cardiovascular: Normal rate, regular rhythm and normal heart sounds.   Pulmonary/Chest: Effort normal and breath sounds normal.  Musculoskeletal: She exhibits no edema.  Lymphadenopathy:    She has no cervical adenopathy.  Neurological: She is alert and oriented to person, place, and time.  Skin: Skin is warm and dry.   Psychiatric: She has a normal mood and affect. Her behavior is normal.          Assessment & Plan:  COPD - I. think at this point she is back to baseline  But will not do well long if she continues to smoke. She's completed her course of steroids. She's not wheezing on exam today and is able to complete full sentences.  Dysphasia-strongly encouraged her to get in with GI ASAP. Especially if she is at the point where she's actually choking on water and it's not passing. I think this is quite dangerous and encouraged her to get in as soon as possible. She said she had put off seeing GI because of the cost. She prefers to be seen in [facility]. Has lost 23 lbs since september  Sinus pain-actually would like her to stop her nasal steroid sprays for couple of days just to see if she gets any relief. Also recommend running a humidifier at bedtime and really cutting back on her cigarette smoking. I think that this will help. Moisturize nasal passages.   She has chronic sinus pain and problems. I think she would benefit from a ENT referral in the future but she wants to hold off on this at this time.    Diabetes-she's tolerating onglyza 5mg  samples well and would like some more samples today. I did encourage her to followup in one month for next  diabetic check.  Pnernicous ANemia - due for B12 shot.   Bipolar - Doing well on Geodon, and off seroquel. I a happy that she is less sedated.  Hypothyroid - Her thyroid med was adjusted in the hospital. Needs refill for dose.  Due to recheck TSH. Labslip printed.

## 2013-01-25 MED ORDER — SAXAGLIPTIN HCL 5 MG PO TABS: 5.0000 mg | ORAL_TABLET | Freq: Every day | ORAL | Status: DC

## 2013-01-25 NOTE — Addendum Note (Signed)
Addended by: Deno Etienne on: 01/25/2013 03:00 PM   Modules accepted: Orders

## 2013-01-26 ENCOUNTER — Other Ambulatory Visit: Payer: Self-pay | Admitting: Sports Medicine

## 2013-01-26 ENCOUNTER — Other Ambulatory Visit: Payer: Self-pay

## 2013-01-26 DIAGNOSIS — J441 Chronic obstructive pulmonary disease with (acute) exacerbation: Secondary | ICD-10-CM

## 2013-01-26 DIAGNOSIS — E119 Type 2 diabetes mellitus without complications: Secondary | ICD-10-CM

## 2013-01-26 DIAGNOSIS — E039 Hypothyroidism, unspecified: Secondary | ICD-10-CM

## 2013-01-26 MED ORDER — AMBULATORY NON FORMULARY MEDICATION: Status: DC

## 2013-02-01 ENCOUNTER — Telehealth: Payer: Self-pay | Admitting: *Deleted

## 2013-02-01 MED ORDER — SULFAMETHOXAZOLE-TRIMETHOPRIM 800-160 MG PO TABS: 1.0000 | ORAL_TABLET | Freq: Two times a day (BID) | ORAL | Status: DC

## 2013-02-01 NOTE — Telephone Encounter (Signed)
rx sent to pharmacy

## 2013-02-01 NOTE — Telephone Encounter (Signed)
Sinus no better, bad congestion, H/A and was told to call if no better and would call in antibiotic. [facility]

## 2013-02-02 ENCOUNTER — Other Ambulatory Visit: Payer: Self-pay | Admitting: *Deleted

## 2013-02-02 MED ORDER — ALPRAZOLAM 1 MG PO TABS: ORAL_TABLET | ORAL | Status: DC

## 2013-02-02 MED ORDER — ZOLPIDEM TARTRATE 10 MG PO TABS: 10.0000 mg | ORAL_TABLET | Freq: Every evening | ORAL | Status: DC | PRN

## 2013-02-09 ENCOUNTER — Telehealth: Payer: Self-pay

## 2013-02-09 NOTE — Telephone Encounter (Signed)
Left message for patient to return call.

## 2013-02-09 NOTE — Telephone Encounter (Signed)
When did she start having sxs.  She was doing great on it when I saw her last time. In fact she had less sedation on the geodon than the seroquel and felt it was helping her mood more.  It would be unusual to suddenly have sxs 3-4 weeks into taking it.

## 2013-02-09 NOTE — Telephone Encounter (Addendum)
[facility] called: [facility] decreased her Geodon to once daily due to shakiness and extreme sleepiness,  a week ago. She then stopped the Geodon altogether 2 days ago. There is no notable behavior changes at this point, per [facility]. She has been on the Geodon for 1 month. Do you want to switch her medication? Please advise.

## 2013-02-10 NOTE — Telephone Encounter (Signed)
She should probaly just make an appt to discuss options is she doesn't want to take the Geodon.

## 2013-02-10 NOTE — Telephone Encounter (Signed)
Pt calls back and states that she started having sx's of shaky episodes 2 days ago. Yesterday the shaking was better she took a muscle relaxer and Gabapentin which helped her

## 2013-02-10 NOTE — Telephone Encounter (Signed)
Pt notified and sent to schedule appointment 

## 2013-02-21 ENCOUNTER — Ambulatory Visit: Payer: Medicare Other | Admitting: Family Medicine

## 2013-02-22 ENCOUNTER — Ambulatory Visit: Payer: Medicare Other | Admitting: Family Medicine

## 2013-03-01 ENCOUNTER — Ambulatory Visit: Payer: Medicare Other | Admitting: Family Medicine

## 2013-03-04 ENCOUNTER — Telehealth: Payer: Self-pay | Admitting: *Deleted

## 2013-03-04 ENCOUNTER — Telehealth: Payer: Self-pay

## 2013-03-04 MED ORDER — IPRATROPIUM-ALBUTEROL 0.5-2.5 (3) MG/3ML IN SOLN: 3.0000 mL | RESPIRATORY_TRACT | Status: DC | PRN

## 2013-03-04 NOTE — Telephone Encounter (Signed)
I did fill this for her when she first got out of the hospital. Who  was rx it before?. We may also consider referral to pain management. I want to say I think she was getting this from her orthopedist. That's where she needs to continue to get it.

## 2013-03-04 NOTE — Telephone Encounter (Signed)
Patient called and left a message on nurse line asking for a return call.   Returned Call: Left message asking patient to call back.  

## 2013-03-04 NOTE — Telephone Encounter (Signed)
Pt calls and request a refill on her Oxycodone

## 2013-03-07 MED ORDER — MELOXICAM 15 MG PO TABS: 15.0000 mg | ORAL_TABLET | Freq: Every day | ORAL | Status: DC

## 2013-03-07 MED ORDER — ZOLPIDEM TARTRATE 10 MG PO TABS: 10.0000 mg | ORAL_TABLET | Freq: Every evening | ORAL | Status: DC | PRN

## 2013-03-07 MED ORDER — ALPRAZOLAM 1 MG PO TABS: ORAL_TABLET | ORAL | Status: DC

## 2013-03-07 NOTE — Telephone Encounter (Addendum)
Krystal Burgess states she has chronic back pain and Dr Linford Arnold would refill her oxycodone as long as she comes by and picks it up.    Refilled all meds below mobic ambien xanax

## 2013-03-08 NOTE — Telephone Encounter (Signed)
Pt notified and states that her neurologist was giving her the pain meds so instructed her to have them send records as to why she was given oxycodone and [name] would review. Barry Dienes, LPN

## 2013-03-08 NOTE — Telephone Encounter (Signed)
Then needs to get notes from her ortho about her pain needs and if they recommend chronic pain meds. Please see below.

## 2013-03-10 ENCOUNTER — Telehealth: Payer: Self-pay | Admitting: *Deleted

## 2013-03-10 NOTE — Telephone Encounter (Signed)
Stacy from Dr. Alesia Richards office called back and stated that they will no longer prescribe pt's pain meds, and that there has not been a referral made for surgery for the patient.

## 2013-03-10 NOTE — Telephone Encounter (Signed)
Called and lmovm for clinical staff to return my call in regards to whether or not Dr. Estella Husk will continue prescribing her pain meds and whether or not a surgical referral has been placed for her.Krystal Burgess

## 2013-03-14 ENCOUNTER — Ambulatory Visit (INDEPENDENT_AMBULATORY_CARE_PROVIDER_SITE_OTHER): Payer: Medicare Other | Admitting: Family Medicine

## 2013-03-14 ENCOUNTER — Encounter: Payer: Self-pay | Admitting: Family Medicine

## 2013-03-14 VITALS — BP 142/81 | HR 107 | Ht 62.0 in | Wt 250.0 lb

## 2013-03-14 DIAGNOSIS — F319 Bipolar disorder, unspecified: Secondary | ICD-10-CM

## 2013-03-14 DIAGNOSIS — G5603 Carpal tunnel syndrome, bilateral upper limbs: Secondary | ICD-10-CM | POA: Insufficient documentation

## 2013-03-14 DIAGNOSIS — E139 Other specified diabetes mellitus without complications: Secondary | ICD-10-CM

## 2013-03-14 DIAGNOSIS — J441 Chronic obstructive pulmonary disease with (acute) exacerbation: Secondary | ICD-10-CM

## 2013-03-14 DIAGNOSIS — G5600 Carpal tunnel syndrome, unspecified upper limb: Secondary | ICD-10-CM

## 2013-03-14 DIAGNOSIS — G8929 Other chronic pain: Secondary | ICD-10-CM

## 2013-03-14 DIAGNOSIS — T380X5A Adverse effect of glucocorticoids and synthetic analogues, initial encounter: Secondary | ICD-10-CM

## 2013-03-14 DIAGNOSIS — E099 Drug or chemical induced diabetes mellitus without complications: Secondary | ICD-10-CM

## 2013-03-14 DIAGNOSIS — M549 Dorsalgia, unspecified: Secondary | ICD-10-CM

## 2013-03-14 DIAGNOSIS — I10 Essential (primary) hypertension: Secondary | ICD-10-CM

## 2013-03-14 DIAGNOSIS — M48061 Spinal stenosis, lumbar region without neurogenic claudication: Secondary | ICD-10-CM

## 2013-03-14 LAB — POCT UA - MICROALBUMIN: Albumin/Creatinine Ratio, Urine, POC: <30

## 2013-03-14 LAB — POCT GLYCOSYLATED HEMOGLOBIN (HGB A1C): Hemoglobin A1C: 6

## 2013-03-14 MED ORDER — LINAGLIPTIN 5 MG PO TABS
5.0000 mg | ORAL_TABLET | Freq: Every day | ORAL | Status: DC
Start: 2013-03-14 — End: 2013-04-26

## 2013-03-14 MED ORDER — HYDROCODONE-ACETAMINOPHEN 10-325 MG PO TABS: 1.0000 | ORAL_TABLET | Freq: Four times a day (QID) | ORAL | Status: DC | PRN

## 2013-03-14 MED ORDER — HYDROCODONE-ACETAMINOPHEN 10-325 MG PO TABS: 1.0000 | ORAL_TABLET | Freq: Three times a day (TID) | ORAL | Status: DC | PRN

## 2013-03-14 MED ORDER — PREDNISONE 20 MG PO TABS: ORAL_TABLET | ORAL | Status: DC

## 2013-03-14 NOTE — Progress Notes (Signed)
Subjective:    Patient ID: Krystal Burgess, female    DOB: 1961-10-10, 52 y.o.   MRN: [id number]  HPI  DM- steorid induced.  Metformin caused nause and vomiting.  We stopped this and put her on Onglyza. She still had some GI upset and nausea with it so she stopped it about a week ago. She has not taken anything since then. She's here for repeat hemoglobin A1c today.  Had problems getting her medications.  She said she called a week or 2 ago to have refills on her prescriptions because she has changed pharmacies and she says this comes 2 weeks for her medications to be called in.  Coughing x 2 days. Some increase sputum.  Has been more cold lately but no fever.  Right ear pressure and no ST.    chronci back pain - she would like for me to take over prescribing her chronic pain medications. She currently gets hydrocodone/acetaminophen 10/325 3 times a day when necessary, 120 per month. She was previously getting this medication from Dr. Cristal Ford, her neurologist. He last saw her in April of last year. He did referred her to Dr. Manson Passey, at [facility]. He evaluated her and performed a lumbar laminectomy. She was last seen in August at his office. He did refill her pain medications for about 3 months postop. She would like me to take over prescribing her medications.  Bipolar-she started to get shakes on the Geodon so stopped it on her own. Review of Systems     Objective:   Physical Exam  Constitutional: She is oriented to person, place, and time. She appears well-developed and well-nourished.  HENT:  Head: Normocephalic and atraumatic.  Right Ear: External ear normal.  Left Ear: External ear normal.  Nose: Nose normal.  Mouth/Throat: Oropharynx is clear and moist.  TMs and canals are clear.   Eyes: Conjunctivae and EOM are normal. Pupils are equal, round, and reactive to light.  Neck: Neck supple. No thyromegaly present.  Cardiovascular: Normal rate, regular rhythm and  normal heart sounds.   Pulmonary/Chest: Effort normal. She has wheezes.  Diffuse expiratory wheezing.  Lymphadenopathy:    She has no cervical adenopathy.  Neurological: She is alert and oriented to person, place, and time.  Skin: Skin is warm and dry.  Psychiatric: She has a normal mood and affect.          Assessment & Plan:  DM- well controlled. Hemoglobin A1c looks good today. I would like to try tradjenta 5 mg daily. Followup for diabetes in 3 months. Her hemoglobin A1c does seem to be coming down.  COPD exacerbation-I. do feel like she's having a COPD exacerbation or at least the beginning of 1. I will try treating with steroids and hold off on antibiotic. If she's noticing more purulent sputum, I reviewed the signs and symptoms with her, and she is to call the office immediately and I will call in an antibiotic. At this point time she's only noticed an increase in sputum but no change in color taste over [place] [employer]. Encourage smoking cessation.  Chronic back pain - I discussed with her that I am okay with taking over the prescription for chronic pain medications. I did give her refill today and we do have notes from her neurologist as well as her neurosurgeon. I think that this is reasonable at this time. At followup visit she will need to sign a pain contract with me.  Hypertension-slightly elevated today. We'll recheck at  her followup visit.  Bipolar disorder-she stopped the Geodon on her own because of shakes. I asked her if she wanted to go back on either Seroquel or whispered all. She wanted to think about it.

## 2013-04-04 ENCOUNTER — Other Ambulatory Visit: Payer: Self-pay | Admitting: Family Medicine

## 2013-04-12 ENCOUNTER — Telehealth: Payer: Self-pay | Admitting: *Deleted

## 2013-04-12 NOTE — Telephone Encounter (Signed)
Patient calls and request a refill on her hydrocodone. Let pt know when done. Barry Dienes, LPN

## 2013-04-12 NOTE — Telephone Encounter (Signed)
Ok to refill but she needs to sign a pain contract. Can come in to pick up rx and sign contracts at same time.

## 2013-04-13 MED ORDER — HYDROCODONE-ACETAMINOPHEN 10-325 MG PO TABS: 1.0000 | ORAL_TABLET | Freq: Four times a day (QID) | ORAL | Status: DC | PRN

## 2013-04-13 NOTE — Telephone Encounter (Signed)
LMOM to return call. Malique Driskill, LPN  

## 2013-04-13 NOTE — Telephone Encounter (Signed)
Pt notified and will come by tomorrow and sign pain contract and pick up rx. Barry Dienes, LPN

## 2013-04-26 ENCOUNTER — Ambulatory Visit (INDEPENDENT_AMBULATORY_CARE_PROVIDER_SITE_OTHER): Payer: Medicare Other | Admitting: Family Medicine

## 2013-04-26 ENCOUNTER — Encounter: Payer: Self-pay | Admitting: Family Medicine

## 2013-04-26 VITALS — BP 127/70 | HR 107 | Wt 249.0 lb

## 2013-04-26 DIAGNOSIS — Z1231 Encounter for screening mammogram for malignant neoplasm of breast: Secondary | ICD-10-CM

## 2013-04-26 DIAGNOSIS — F3289 Other specified depressive episodes: Secondary | ICD-10-CM

## 2013-04-26 DIAGNOSIS — F32A Depression, unspecified: Secondary | ICD-10-CM

## 2013-04-26 DIAGNOSIS — J441 Chronic obstructive pulmonary disease with (acute) exacerbation: Secondary | ICD-10-CM

## 2013-04-26 DIAGNOSIS — N644 Mastodynia: Secondary | ICD-10-CM

## 2013-04-26 DIAGNOSIS — D510 Vitamin B12 deficiency anemia due to intrinsic factor deficiency: Secondary | ICD-10-CM

## 2013-04-26 DIAGNOSIS — F329 Major depressive disorder, single episode, unspecified: Secondary | ICD-10-CM

## 2013-04-26 MED ORDER — QUETIAPINE FUMARATE ER 300 MG PO TB24: 300.0000 mg | ORAL_TABLET | Freq: Every day | ORAL | Status: DC

## 2013-04-26 MED ORDER — DULOXETINE HCL 30 MG PO CPEP: ORAL_CAPSULE | ORAL | Status: DC

## 2013-04-26 MED ORDER — LEVOTHYROXINE SODIUM 100 MCG PO TABS: 100.0000 ug | ORAL_TABLET | Freq: Every day | ORAL | Status: DC

## 2013-04-26 MED ORDER — IBUPROFEN 800 MG PO TABS: 800.0000 mg | ORAL_TABLET | Freq: Three times a day (TID) | ORAL | Status: DC | PRN

## 2013-04-26 MED ORDER — PREDNISONE 20 MG PO TABS: ORAL_TABLET | ORAL | Status: DC

## 2013-04-26 MED ORDER — DULOXETINE HCL 30 MG PO CPEP: 30.0000 mg | ORAL_CAPSULE | Freq: Every day | ORAL | Status: DC

## 2013-04-26 MED ORDER — DULOXETINE HCL 60 MG PO CPEP: 60.0000 mg | ORAL_CAPSULE | Freq: Every day | ORAL | Status: DC

## 2013-04-26 NOTE — Progress Notes (Signed)
  Subjective:    Patient ID: Krystal Burgess, female    DOB: 1961/02/09, 52 y.o.   MRN: [id number]  HPI Bilateral breast pain and tenderness for about 2 weeks. Drinks a lot of caffeine.  Has had cyst before but no prior biopsies.    Her insurance will only doe 2 pills a days for her cymbalta. She needs a prescription written for 60 mg and a 30 mg tablet instead of 3 of the 30 mg tabs. She wants to know if we can also add a mood stabilizer back. We have done this in the past when she has become more severely depressed. She has felt much more down lately. No thoughts of hurting herself. She would like to retry the Seroquel.  For the last week she's had increase in chest congestion and coughing. She has a little bit more short of breath. She's had to wake up in the middle night to use her inhaler. She has been wheezing more. She says her sputum has changed whitish colored a little yellow tinge color. She denies any fever or sore throat. She continues to smoke. Finished course of steroids about 2 weeks ago.    Review of Systems     Objective:   Physical Exam  Constitutional: She is oriented to person, place, and time. She appears well-developed and well-nourished.  HENT:  Head: Normocephalic and atraumatic.  Right Ear: External ear normal.  Left Ear: External ear normal.  Nose: Nose normal.  Mouth/Throat: Oropharynx is clear and moist.  TMs and canals are clear.   Eyes: Conjunctivae and EOM are normal. Pupils are equal, round, and reactive to light.  Neck: Neck supple. No thyromegaly present.  Cardiovascular: Normal rate, regular rhythm and normal heart sounds.   Pulmonary/Chest: Effort normal and breath sounds normal. She has no wheezes. She exhibits no mass, no tenderness, no deformity and no retraction. Right breast exhibits no mass, no nipple discharge, no skin change and no tenderness. Left breast exhibits no mass, no nipple discharge, no skin change and no tenderness. Breasts are  symmetrical.  Lymphadenopathy:    She has no cervical adenopathy.  Neurological: She is alert and oriented to person, place, and time.  Skin: Skin is warm and dry.  Psychiatric: She has a normal mood and affect.          Assessment & Plan:  Bilateral breast pain- Will order a diagnostic mammo. Prefers late day appt. Previously seen at [facility].  Discussed need to reduce caffeine ( she drinks really large amounts of caffeine)  Pernicious anemia-due for B12 injection today.  Depression-worsening. Will add Seroquel to her Cymbalta. Prescription rewritten for a 60 and 30 mg tabs. Followup in 2 weeks to make sure that she's doing okay.Call if too sedating. Take in the evening.  Don't mix with her sleep medications.  She understands and agree.   Mild COPD exacerbation-will call in course of prednisone. At this point will hold off on antibiotic if she's not improving on the steroids in the next 3-4 days before the weekend then please call back and will placer her on a course of antibiotics. Followup in 2 weeks to check lungs.

## 2013-05-05 ENCOUNTER — Other Ambulatory Visit: Payer: Self-pay | Admitting: Physician Assistant

## 2013-05-06 ENCOUNTER — Telehealth: Payer: Self-pay | Admitting: *Deleted

## 2013-05-06 MED ORDER — NYSTATIN 100000 UNIT/ML MT SUSP: 500000.0000 [IU] | Freq: Four times a day (QID) | OROMUCOSAL | Status: DC

## 2013-05-06 NOTE — Telephone Encounter (Signed)
Pt notified of rx. 

## 2013-05-06 NOTE — Telephone Encounter (Signed)
Pt calls and states she is on steroids again and has thrush in her mouth. Wants to know if you will send in the Nystatin to [facility] [address] in [place]. Barry Dienes, LPN

## 2013-05-06 NOTE — Telephone Encounter (Signed)
rx sent

## 2013-05-12 ENCOUNTER — Telehealth: Payer: Self-pay | Admitting: *Deleted

## 2013-05-12 ENCOUNTER — Ambulatory Visit (INDEPENDENT_AMBULATORY_CARE_PROVIDER_SITE_OTHER): Payer: Medicare Other | Admitting: Family Medicine

## 2013-05-12 ENCOUNTER — Encounter: Payer: Self-pay | Admitting: Family Medicine

## 2013-05-12 VITALS — BP 136/73 | HR 104 | Wt 251.0 lb

## 2013-05-12 DIAGNOSIS — D510 Vitamin B12 deficiency anemia due to intrinsic factor deficiency: Secondary | ICD-10-CM

## 2013-05-12 DIAGNOSIS — J309 Allergic rhinitis, unspecified: Secondary | ICD-10-CM

## 2013-05-12 DIAGNOSIS — J441 Chronic obstructive pulmonary disease with (acute) exacerbation: Secondary | ICD-10-CM

## 2013-05-12 DIAGNOSIS — J45909 Unspecified asthma, uncomplicated: Secondary | ICD-10-CM

## 2013-05-12 DIAGNOSIS — J449 Chronic obstructive pulmonary disease, unspecified: Secondary | ICD-10-CM

## 2013-05-12 MED ORDER — PREDNISONE 20 MG PO TABS: ORAL_TABLET | ORAL | Status: DC

## 2013-05-12 MED ORDER — TRAMADOL HCL 50 MG PO TABS: 50.0000 mg | ORAL_TABLET | Freq: Two times a day (BID) | ORAL | Status: DC | PRN

## 2013-05-12 MED ORDER — HYDROCODONE-ACETAMINOPHEN 10-325 MG PO TABS: 1.0000 | ORAL_TABLET | Freq: Four times a day (QID) | ORAL | Status: DC | PRN

## 2013-05-12 NOTE — Progress Notes (Signed)
  Subjective:    Patient ID: Krystal Burgess, female    DOB: 09-22-1961, 52 y.o.   MRN: [id number]  HPI COPD/Asthma- FAce has been hurting from her sinuses and her right ear has been painful and down into lowre jaw. No fever or change in sputium but has been more SOB.  Finished steroid yesterday. They are getting ready to pull up carpet in her bedroom ans she is afraid this will flare her asthma over the weekend. Would like a refill on her steroids.      Review of Systems     Objective:   Physical Exam  Constitutional: She is oriented to person, place, and time. She appears well-developed and well-nourished.  HENT:  Head: Normocephalic and atraumatic.  Right Ear: External ear normal.  Left Ear: External ear normal.  Nose: Nose normal.  Mouth/Throat: Oropharynx is clear and moist.  TMs and canals are clear.   Eyes: Conjunctivae and EOM are normal. Pupils are equal, round, and reactive to light.  Neck: Neck supple. No thyromegaly present.  Cardiovascular: Normal rate, regular rhythm and normal heart sounds.   Pulmonary/Chest: Effort normal and breath sounds normal. She has no wheezes.  Slight expiratory wheeze.  Lymphadenopathy:    She has no cervical adenopathy.  Neurological: She is alert and oriented to person, place, and time.  Skin: Skin is warm and dry.  Psychiatric: She has a normal mood and affect.          Assessment & Plan:  COPD/Asthma- Improved but still not at baseline. Refilled steroids.   Call if not continuing to improve.  Discussed sending her to pulmonologist.  She has such frequent flares and is on steroid so frequently I really think she needs a pulmonologist. She continues to smoke and we discussed smoking cessation. And she is around her second hand smoke in the home. She also has animals which she knows she is allergic to.   Pernicious anemia - B12 injection given.

## 2013-05-16 ENCOUNTER — Other Ambulatory Visit: Payer: Self-pay | Admitting: Physician Assistant

## 2013-05-26 ENCOUNTER — Other Ambulatory Visit: Payer: Self-pay | Admitting: Family Medicine

## 2013-05-27 ENCOUNTER — Other Ambulatory Visit: Payer: Self-pay | Admitting: *Deleted

## 2013-05-27 MED ORDER — LOSARTAN POTASSIUM 100 MG PO TABS: ORAL_TABLET | ORAL | Status: DC

## 2013-05-27 MED ORDER — AMBULATORY NON FORMULARY MEDICATION: Status: DC

## 2013-05-27 NOTE — Telephone Encounter (Signed)
Pt states that she needs to get Vitamin B12 injection sent to pharmacy. States she was getting from neuro but you had taken over and now needs refill on it along with the needles.

## 2013-06-03 ENCOUNTER — Other Ambulatory Visit: Payer: Self-pay | Admitting: Family Medicine

## 2013-06-14 ENCOUNTER — Other Ambulatory Visit: Payer: Self-pay | Admitting: *Deleted

## 2013-06-14 MED ORDER — HYDROCODONE-ACETAMINOPHEN 10-325 MG PO TABS: 1.0000 | ORAL_TABLET | Freq: Four times a day (QID) | ORAL | Status: DC | PRN

## 2013-06-22 ENCOUNTER — Other Ambulatory Visit: Payer: Self-pay | Admitting: Family Medicine

## 2013-06-22 NOTE — Telephone Encounter (Signed)
Needs f/u appt before future refills 

## 2013-06-23 ENCOUNTER — Ambulatory Visit: Payer: Medicare Other | Admitting: Family Medicine

## 2013-06-23 ENCOUNTER — Other Ambulatory Visit: Payer: Self-pay | Admitting: Family Medicine

## 2013-06-28 ENCOUNTER — Ambulatory Visit: Payer: Medicare Other | Admitting: Family Medicine

## 2013-06-30 ENCOUNTER — Encounter: Payer: Self-pay | Admitting: Family Medicine

## 2013-06-30 ENCOUNTER — Ambulatory Visit (INDEPENDENT_AMBULATORY_CARE_PROVIDER_SITE_OTHER): Payer: Medicare Other | Admitting: Family Medicine

## 2013-06-30 ENCOUNTER — Other Ambulatory Visit: Payer: Self-pay

## 2013-06-30 VITALS — BP 128/75 | HR 95 | Ht 61.0 in | Wt 255.0 lb

## 2013-06-30 DIAGNOSIS — E119 Type 2 diabetes mellitus without complications: Secondary | ICD-10-CM

## 2013-06-30 DIAGNOSIS — J449 Chronic obstructive pulmonary disease, unspecified: Secondary | ICD-10-CM

## 2013-06-30 DIAGNOSIS — E538 Deficiency of other specified B group vitamins: Secondary | ICD-10-CM

## 2013-06-30 DIAGNOSIS — J4489 Other specified chronic obstructive pulmonary disease: Secondary | ICD-10-CM

## 2013-06-30 MED ORDER — METHOCARBAMOL 500 MG PO TABS: 500.0000 mg | ORAL_TABLET | Freq: Three times a day (TID) | ORAL | Status: DC

## 2013-06-30 MED ORDER — CYANOCOBALAMIN 1000 MCG/ML IJ SOLN
1000.0000 ug | Freq: Once | INTRAMUSCULAR | Status: AC
  Administered 2013-06-30: 1000 ug via INTRAMUSCULAR

## 2013-06-30 MED ORDER — AMBULATORY NON FORMULARY MEDICATION: Status: DC

## 2013-06-30 MED ORDER — METHYLPREDNISOLONE SODIUM SUCC 125 MG IJ SOLR
125.0000 mg | Freq: Once | INTRAMUSCULAR | Status: AC
  Administered 2013-06-30: 125 mg via INTRAMUSCULAR

## 2013-06-30 MED ORDER — ALPRAZOLAM 1 MG PO TABS: ORAL_TABLET | ORAL | Status: DC

## 2013-06-30 MED ORDER — CYANOCOBALAMIN 1000 MCG/ML IJ SOLN: 1000.0000 ug | INTRAMUSCULAR | Status: DC

## 2013-06-30 MED ORDER — AMOXICILLIN-POT CLAVULANATE 875-125 MG PO TABS: 1.0000 | ORAL_TABLET | Freq: Two times a day (BID) | ORAL | Status: DC

## 2013-06-30 MED ORDER — PREDNISONE 20 MG PO TABS: ORAL_TABLET | ORAL | Status: DC

## 2013-06-30 MED ORDER — NYSTATIN 100000 UNIT/ML MT SUSP: OROMUCOSAL | Status: DC

## 2013-06-30 NOTE — Progress Notes (Signed)
  Subjective:    Patient ID: Krystal Burgess, female    DOB: 16-Dec-1961, 52 y.o.   MRN: [id number]  HPI DM- she has not been checking her sugars as she is still having difficulty getting a glucometer. She would like to order presented today. We have faxed it to her pharmacy last time but the pharmacy reports that they never got it. She denies any known hypoglycemic events. No wounds that are not healing well.  Needs new rx for machine, lancets or strips. She has felt dizzy the last couple fof days.    Has had diarrhea for 2-3 days.  Whole family has had the "stomach bug" no vomiting.  She needs more phenergan. She has been using peptobismol. Some fever and chills.  No sign abdominal pain.   She has had a lot of nasal congestion.  She has ben more SOB and has had cough is wet.  Thick white sputum.  Cough is waking her . Has had fever/chills.    Saw allergist and they couldn't do allergy testing bc of seroquel.    Pernicious anemia-she would like to start doing her own B12 injections at home. She was doing it initially to her neurologist. She would like a prescription for B12 sent to her pharmacy.  Review of Systems     Objective:   Physical Exam  Constitutional: She is oriented to person, place, and time. She appears well-developed and well-nourished.  HENT:  Head: Normocephalic and atraumatic.  Right Ear: External ear normal.  Left Ear: External ear normal.  Nose: Nose normal.  TMs and canals are clear. Poor visualization of the OP  Eyes: Conjunctivae and EOM are normal. Pupils are equal, round, and reactive to light.  Neck: Neck supple. No thyromegaly present.  Cardiovascular: Normal rate, regular rhythm and normal heart sounds.   Pulmonary/Chest: Effort normal. She has wheezes.  Diffuse expiratory wheezing, worse on the right.  Lymphadenopathy:    She has no cervical adenopathy.  Neurological: She is alert and oriented to person, place, and time.  Skin: Skin is warm and dry.   Psychiatric: She has a normal mood and affect. Her behavior is normal.          Assessment & Plan:  DM- well controlled. Continue current regimen. Continue to work on lifestyle changes, diet and weight loss. Followup in 3-4 months. Urine micron beam is up-to-date.  Diarrhea - likely viral illness. Make sure drinking plenty of fluids and staying hydrated. Call if not resolving by Monday or Tuesday of next week.  Bronchitis/COPD Exacerbation - she still having frequent exacerbations. She is scheduled for allergy testing with her allergist in the next few weeks which should be helpful in helping her to avoid sugars. She still continues to smoke. We'll treat with Augmentin for her sinus symptoms in addition to a round of prednisone.  Sciatica-she has a flare of sciatica in her left leg from her chronic low back pain-hopefully the steroids will be helpful in relieving her symptoms with this.  Pernicious anemia-I. will send her a prescription for B12 file to her pharmacy and she can do injections at home. Call if any problems. B12 injection given here in the office today.

## 2013-06-30 NOTE — Patient Instructions (Addendum)
Call if you are not improving on the antibiotics and prednisone Please schedule your mammogram as soon as possible.

## 2013-07-01 MED ORDER — PROMETHAZINE HCL 25 MG PO TABS: 25.0000 mg | ORAL_TABLET | Freq: Four times a day (QID) | ORAL | Status: DC | PRN

## 2013-07-01 MED ORDER — AMBULATORY NON FORMULARY MEDICATION: Status: DC

## 2013-07-01 NOTE — Addendum Note (Signed)
Addended by: Nani Gasser D on: 07/01/2013 08:01 AM   Modules accepted: Orders

## 2013-07-07 ENCOUNTER — Other Ambulatory Visit: Payer: Self-pay | Admitting: Family Medicine

## 2013-07-12 ENCOUNTER — Other Ambulatory Visit: Payer: Self-pay | Admitting: Family Medicine

## 2013-07-12 ENCOUNTER — Telehealth: Payer: Self-pay | Admitting: *Deleted

## 2013-07-12 ENCOUNTER — Other Ambulatory Visit: Payer: Self-pay | Admitting: *Deleted

## 2013-07-12 MED ORDER — HYDROCODONE-ACETAMINOPHEN 10-325 MG PO TABS: 1.0000 | ORAL_TABLET | Freq: Four times a day (QID) | ORAL | Status: DC | PRN

## 2013-07-12 NOTE — Telephone Encounter (Signed)
Med refilled pt notified.

## 2013-07-12 NOTE — Telephone Encounter (Signed)
Ok to fill 

## 2013-07-12 NOTE — Telephone Encounter (Signed)
Pt calls & would like a refill on her hydrocodone.  Looks like it's not due until the 24th.  Do you approve of an early refill? Please advise

## 2013-07-15 ENCOUNTER — Other Ambulatory Visit: Payer: Self-pay | Admitting: Family Medicine

## 2013-07-27 ENCOUNTER — Other Ambulatory Visit: Payer: Self-pay | Admitting: Physician Assistant

## 2013-08-01 ENCOUNTER — Other Ambulatory Visit: Payer: Self-pay | Admitting: Family Medicine

## 2013-08-08 ENCOUNTER — Other Ambulatory Visit: Payer: Self-pay | Admitting: Family Medicine

## 2013-08-10 ENCOUNTER — Other Ambulatory Visit: Payer: Self-pay

## 2013-08-10 ENCOUNTER — Telehealth: Payer: Self-pay

## 2013-08-10 NOTE — Telephone Encounter (Signed)
Ok to fill if due

## 2013-08-10 NOTE — Telephone Encounter (Signed)
Patient requesting a refill on Hydrocodone Acetaminophen and Ambien.

## 2013-08-11 MED ORDER — ZOLPIDEM TARTRATE 10 MG PO TABS: ORAL_TABLET | ORAL | Status: DC

## 2013-08-11 MED ORDER — HYDROCODONE-ACETAMINOPHEN 10-325 MG PO TABS: 1.0000 | ORAL_TABLET | Freq: Four times a day (QID) | ORAL | Status: DC | PRN

## 2013-08-12 ENCOUNTER — Other Ambulatory Visit: Payer: Self-pay | Admitting: Family Medicine

## 2013-08-12 NOTE — Telephone Encounter (Signed)
Call pt: We received a notification from your health insurance company that you may not be taking some of your medications on a regular basis. We wanted to make sure that you're getting the prescriptions that you need in a timely fashion and that you're not having any problems or difficulty at the pharmacy. If there is anything that we can assist you with, to help you take your medications regularly, then please let us know.  

## 2013-08-24 ENCOUNTER — Other Ambulatory Visit: Payer: Self-pay | Admitting: Family Medicine

## 2013-08-24 ENCOUNTER — Telehealth: Payer: Self-pay | Admitting: *Deleted

## 2013-08-24 ENCOUNTER — Encounter: Payer: Self-pay | Admitting: Physician Assistant

## 2013-08-24 ENCOUNTER — Ambulatory Visit (INDEPENDENT_AMBULATORY_CARE_PROVIDER_SITE_OTHER): Payer: Medicare Other | Admitting: Physician Assistant

## 2013-08-24 VITALS — BP 128/70 | HR 106 | Wt 264.0 lb

## 2013-08-24 DIAGNOSIS — J45901 Unspecified asthma with (acute) exacerbation: Secondary | ICD-10-CM

## 2013-08-24 DIAGNOSIS — J309 Allergic rhinitis, unspecified: Secondary | ICD-10-CM

## 2013-08-24 DIAGNOSIS — J441 Chronic obstructive pulmonary disease with (acute) exacerbation: Secondary | ICD-10-CM

## 2013-08-24 MED ORDER — IPRATROPIUM-ALBUTEROL 0.5-2.5 (3) MG/3ML IN SOLN
3.0000 mL | Freq: Once | RESPIRATORY_TRACT | Status: AC
  Administered 2013-08-24: 3 mL via RESPIRATORY_TRACT

## 2013-08-24 MED ORDER — PREDNISONE 20 MG PO TABS: ORAL_TABLET | ORAL | Status: DC

## 2013-08-24 MED ORDER — METHYLPREDNISOLONE SODIUM SUCC 125 MG IJ SOLR
125.0000 mg | Freq: Once | INTRAMUSCULAR | Status: AC
  Administered 2013-08-24: 125 mg via INTRAMUSCULAR

## 2013-08-24 MED ORDER — DOXYCYCLINE HYCLATE 100 MG PO CAPS: 100.0000 mg | ORAL_CAPSULE | Freq: Two times a day (BID) | ORAL | Status: DC

## 2013-08-24 MED ORDER — ALBUTEROL SULFATE HFA 108 (90 BASE) MCG/ACT IN AERS: 2.0000 | INHALATION_SPRAY | RESPIRATORY_TRACT | Status: DC | PRN

## 2013-08-24 MED ORDER — ROFLUMILAST 500 MCG PO TABS: 500.0000 ug | ORAL_TABLET | Freq: Every day | ORAL | Status: DC

## 2013-08-24 MED ORDER — ALBUTEROL SULFATE (5 MG/ML) 0.5% IN NEBU: 2.5000 mg | INHALATION_SOLUTION | RESPIRATORY_TRACT | Status: DC

## 2013-08-24 MED ORDER — IPRATROPIUM BROMIDE 0.02 % IN SOLN: 0.5000 mg | RESPIRATORY_TRACT | Status: DC

## 2013-08-24 MED ORDER — MONTELUKAST SODIUM 10 MG PO TABS: 10.0000 mg | ORAL_TABLET | Freq: Every day | ORAL | Status: DC

## 2013-08-24 NOTE — Progress Notes (Addendum)
  Subjective:    Patient ID: Krystal Burgess, female    DOB: 1961-07-08, 52 y.o.   MRN: [id number]  HPI Patient is a 52 female who presents to the clinic with 2 days of acute SOB and wheezing. Pt has COPD/asthma. She has been out of her dulera, singulair and dialresp for almost a week. She knows she has really bad allergies and she has been exposed to some of those triggers just as grass lately. She has been increasing her proventil as well as duoneb at home. She denies any fever, chills, sinus pressure, ear pain. This am she woke up and pulse ox per pt was 58. She felt almost no air movement. She continues to smoke. She has a cough that is productive of white sputum. She has a pulmonologist but she owes him money and cannot go to him. She remains on spirvia.    Review of Systems     Objective:   Physical Exam  Constitutional: She is oriented to person, place, and time.  HENT:  Head: Normocephalic and atraumatic.  Right Ear: External ear normal.  Left Ear: External ear normal.  Nose: Nose normal.  Mouth/Throat: Oropharynx is clear and moist.  Eyes: Conjunctivae are normal. Right eye exhibits no discharge. Left eye exhibits no discharge.  Neck: Normal range of motion. Neck supple.  Cardiovascular: Regular rhythm and normal heart sounds.   Pulmonary/Chest:  Before neb: No airmovement. After neb: Wheezing bilaterally as well as rhonchi. Decreased breath sounds.   Lymphadenopathy:    She has no cervical adenopathy.  Neurological: She is alert and oriented to person, place, and time.  Skin: She is diaphoretic.  Psychiatric: She has a normal mood and affect. Her behavior is normal.          Assessment & Plan:  COPD/Asthma flare-Pulse ox on arrival was 81 percent. Started on 2L of O2.  Pt was given solumedrol 125mg  IM in office along with duoneb nebulizer pulse ox went back up to 90 Percent. Pt has O2 at home.Encouraged pt to stay on O2 for next day until oral prednisone and inhaler get  in system. Take frequent pulse ox and if dropping below 88 on O2 call EMS. If below 90 on room air use O2. Sent prednisone for 2 weeks, Doxycyline for 10 days., gave sample of Dulera 200/5 BID, Daliresp, and Singulair. Continue using rescue inhaler every 4 hours as needed with hopes of decreasing in next 4 days. Follow up with PCP in 1 week.   Allergic rhinitis- flonase given.  Pt needs flu shot if improving in one week.

## 2013-08-24 NOTE — Telephone Encounter (Signed)
Pt states she ask you to fill the Proair today at her visit but I don't see where it has been prescribed. Please advise if we can prescribe.  Meyer Cory, LPN

## 2013-08-24 NOTE — Telephone Encounter (Signed)
Yes ok to refill 

## 2013-08-24 NOTE — Patient Instructions (Addendum)
Use Doxycycline if not improving in next 24 hours.   Take prednisone, start back up on dulera, use nebulizer every 4 hours. If dropping below 88 percent on O2 call EMS. Use o2 for next 2 days.

## 2013-08-31 ENCOUNTER — Encounter: Payer: Self-pay | Admitting: Family Medicine

## 2013-08-31 ENCOUNTER — Ambulatory Visit (INDEPENDENT_AMBULATORY_CARE_PROVIDER_SITE_OTHER): Payer: Medicare Other | Admitting: Family Medicine

## 2013-08-31 ENCOUNTER — Other Ambulatory Visit: Payer: Self-pay | Admitting: Family Medicine

## 2013-08-31 VITALS — BP 129/64 | HR 102 | Wt 268.0 lb

## 2013-08-31 DIAGNOSIS — J441 Chronic obstructive pulmonary disease with (acute) exacerbation: Secondary | ICD-10-CM

## 2013-08-31 DIAGNOSIS — G8929 Other chronic pain: Secondary | ICD-10-CM

## 2013-08-31 DIAGNOSIS — E039 Hypothyroidism, unspecified: Secondary | ICD-10-CM

## 2013-08-31 DIAGNOSIS — E785 Hyperlipidemia, unspecified: Secondary | ICD-10-CM

## 2013-08-31 DIAGNOSIS — E538 Deficiency of other specified B group vitamins: Secondary | ICD-10-CM

## 2013-08-31 DIAGNOSIS — Z800 Family history of malignant neoplasm of digestive organs: Secondary | ICD-10-CM

## 2013-08-31 DIAGNOSIS — Z23 Encounter for immunization: Secondary | ICD-10-CM

## 2013-08-31 DIAGNOSIS — H9209 Otalgia, unspecified ear: Secondary | ICD-10-CM

## 2013-08-31 LAB — LIPID PANEL
Cholesterol: 137 mg/dL (ref 0–200)
HDL: 74 mg/dL (ref >39)
Total CHOL/HDL Ratio: 1.9 Ratio
Triglycerides: 96 mg/dL (ref <150)
VLDL: 19 mg/dL (ref 0–40)

## 2013-08-31 LAB — COMPLETE METABOLIC PANEL WITH GFR
AST: 23 U/L (ref 0–37)
Alkaline Phosphatase: 80 U/L (ref 39–117)
BUN: 7 mg/dL (ref 6–23)
Creat: 0.82 mg/dL (ref 0.50–1.10)
GFR, Est Non African American: 84 mL/min
Glucose, Bld: 86 mg/dL (ref 70–99)
Total Bilirubin: 0.2 mg/dL — ABNORMAL LOW (ref 0.3–1.2)

## 2013-08-31 MED ORDER — CYANOCOBALAMIN 1000 MCG/ML IJ SOLN
1000.0000 ug | Freq: Once | INTRAMUSCULAR | Status: AC
  Administered 2013-08-31: 1000 ug via INTRAMUSCULAR

## 2013-08-31 MED ORDER — AMBULATORY NON FORMULARY MEDICATION: Status: DC

## 2013-08-31 MED ORDER — MOMETASONE FURO-FORMOTEROL FUM 200-5 MCG/ACT IN AERO: 2.0000 | INHALATION_SPRAY | Freq: Two times a day (BID) | RESPIRATORY_TRACT | Status: DC

## 2013-08-31 NOTE — Progress Notes (Signed)
Subjective:    Patient ID: Krystal Burgess, female    DOB: 03-May-1961, 52 y.o.   MRN: [id number]  HPI Follow up COPD exacerbation. She was seen about a week ago by my PA, Jade. She was unable to pick up antibiotics  For her ear infection and bronchitis, until last night. She also has a sore scratchy throat that started last night. In her left ear has been hurting. Has had some drianage.  She is still smoking.  Says her right ear bothers her multiple times a week and has been going on for months.  She is worried aobut throat cancer since her brohter died form this and wants to be checked. Oxygen set on 2 liters. She was primarily wearing her oxygen at night but now has been wearing it during the daytime.   She did want to add Augmentin to her intolerance list as she says every time she takes it she gets a bad yeast infection.  B12 deficiency-she needs a new prescription for the syringes and needles to do her B12 shots at home. She says that her mother, who she lives with, accidentally threw them away. She knows that insurance will likely not pay for them.  Review of Systems No chest pain or dysphasia. No heartburn recently. Still occasional wheezing.  BP 129/64  Pulse 102  Wt 268 lb (121.564 kg)  BMI 50.66 kg/m2  SpO2 99%    Allergies  Allergen Reactions  . Aripiprazole     REACTION: muscle jerks  . Augmentin [Amoxicillin-Pot Clavulanate] Other (See Comments)    Gets yeast infection every time   . Fluoxetine Hcl     REACTION: Intolerant  . Geodon [Ziprasidone Hcl] Other (See Comments)    shakes  . Onglyza [Saxagliptin] Nausea Only  . Paroxetine     REACTION: Intolerance    Past Medical History  Diagnosis Date  . Allergy   . Hyperlipidemia   . Thyroid disease   . Pain     Past Surgical History  Procedure Laterality Date  . Cholecystectomy    . Abdominal hysterectomy    . Lipoma removal      RT shoulder  . Pilonydal cyst  1994  . Cesarean section      History    Social History  . Marital Status: Single    Spouse Name: N/A    Number of Children: N/A  . Years of Education: N/A   Occupational History  . Not on file.   Social History Main Topics  . Smoking status: Current Every Day Smoker -- 2.50 packs/day  . Smokeless tobacco: Not on file  . Alcohol Use: Not on file  . Drug Use: No  . Sexual Activity: Not on file     Comment: s with mother currentlynemployed, on disabilty, divorced, liver   Other Topics Concern  . Not on file   Social History Narrative  . No narrative on file    Family History  Problem Relation Age of Onset  . Diabetes Brother   . Depression Brother     bipolar  . Cancer Brother     throat  . Diabetes      grandmother  . Depression Mother   . Hyperlipidemia Mother   . Hypertension Mother   . Hypertension Father     Outpatient Encounter Prescriptions as of 08/31/2013  Medication Sig Dispense Refill  . albuterol (PROVENTIL HFA;VENTOLIN HFA) 108 (90 BASE) MCG/ACT inhaler Inhale 2 puffs into the lungs every 4 (four) hours  as needed for wheezing.  18 g  1  . albuterol (PROVENTIL) (5 MG/ML) 0.5% nebulizer solution Take 5 mg by nebulization every 8 (eight) hours as needed.        . ALPRAZolam (XANAX) 1 MG tablet TAKE 1 TABLET 3 TIMES A DAY  90 tablet  0  . AMBULATORY NON FORMULARY MEDICATION Test strips and lancets for the Accuchek Avivva glucometer.  100 each  5  . AMBULATORY NON FORMULARY MEDICATION Glucometer of patient choice.  Diagnosis: Diabetes  Patient testing 2 times a day  1 each  0  . AMBULATORY NON FORMULARY MEDICATION Medication Name: needles and syringes for b12 injection Dx pernicious anemia  10 Units  PRN  . Armodafinil 50 MG tablet Take 100 mg by mouth daily.      . clotrimazole (LOTRIMIN) 1 % cream Apply topically 2 (two) times daily.  30 g  0  . CRESTOR 20 MG tablet TAKE 1 TABLET EVERY DAY  30 tablet  2  . cyanocobalamin (,VITAMIN B-12,) 1000 MCG/ML injection Inject 1 mL (1,000 mcg total) into  the muscle every 30 (thirty) days.  10 mL  0  . doxycycline (VIBRAMYCIN) 100 MG capsule Take 1 capsule (100 mg total) by mouth 2 (two) times daily. For 10 days.  20 capsule  0  . DULoxetine (CYMBALTA) 30 MG capsule Take 1 capsule (30 mg total) by mouth daily. TAKE 3 CAPSULES BY MOUTH EVERY DAY  90 capsule  1  . DULoxetine (CYMBALTA) 60 MG capsule Take 1 capsule (60 mg total) by mouth daily.  90 capsule  1  . fluticasone (FLONASE) 50 MCG/ACT nasal spray 2 sprays each nostril daily.  16 g  11  . furosemide (LASIX) 20 MG tablet TAKE 1 TO 3 TABLETS BY MOUTH EVERY DAY AS NEEDED AS DIRECTED  90 tablet  0  . gabapentin (NEURONTIN) 600 MG tablet Take 600 mg by mouth daily.        Marland Kitchen HYDROcodone-acetaminophen (NORCO) 10-325 MG per tablet Take 1 tablet by mouth every 6 (six) hours as needed for pain.  120 tablet  0  . ibuprofen (ADVIL,MOTRIN) 800 MG tablet TAKE 1 TABLET BY MOUTH EVERY 8 HOURS AS NEEDED FOR PAIN  60 tablet  0  . ipratropium-albuterol (DUONEB) 0.5-2.5 (3) MG/3ML SOLN USE 1 VIAL EVERY 4 HOURS AS NEEDED  360 mL  0  . levocetirizine (XYZAL) 5 MG tablet Take 5 mg by mouth every evening.        Marland Kitchen levothyroxine (SYNTHROID, LEVOTHROID) 100 MCG tablet Take 1 tablet (100 mcg total) by mouth daily.  30 tablet  3  . losartan (COZAAR) 100 MG tablet TAKE 1 TABLET BY MOUTH EVERY DAY  30 tablet  3  . meloxicam (MOBIC) 15 MG tablet TAKE 1 TABLET BY MOTH DAILY  30 tablet  5  . methocarbamol (ROBAXIN) 500 MG tablet Take 1 tablet (500 mg total) by mouth 3 (three) times daily.  90 tablet  1  . mometasone-formoterol (DULERA) 200-5 MCG/ACT AERO Inhale 2 puffs into the lungs 2 (two) times daily.  13 g  11  . montelukast (SINGULAIR) 10 MG tablet Take 1 tablet (10 mg total) by mouth at bedtime.  30 tablet  6  . nystatin (MYCOSTATIN) 100000 UNIT/ML suspension TAKE BY MOUTH 4 TIMES DAILY  120 mL  0  . omeprazole (PRILOSEC) 20 MG capsule TAKE ONE CAPSULE TWICE A DAY  60 capsule  0  . potassium chloride (KLOR-CON) 10  MEQ CR  tablet Take 10 mEq by mouth daily.        . predniSONE (DELTASONE) 20 MG tablet 2 tabs QD x 7 days, then once a day x 1 week.  21 tablet  0  . PROAIR HFA 108 (90 BASE) MCG/ACT inhaler INHALE 2 PUFFS BY MOUTH EVERY 4 HOURS AS NEEDED  8.5 g  0  . promethazine (PHENERGAN) 25 MG tablet Take 1 tablet (25 mg total) by mouth every 6 (six) hours as needed for nausea.  20 tablet  0  . roflumilast (DALIRESP) 500 MCG TABS tablet Take 1 tablet (500 mcg total) by mouth daily.  30 tablet  6  . SEROQUEL XR 300 MG 24 hr tablet TAKE 1 TABLET (300 MG TOTAL) BY MOUTH AT BEDTIME.  30 tablet  0  . SPIRIVA HANDIHALER 18 MCG inhalation capsule 1 PUFF EVERY DAY  30 capsule  3  . traMADol (ULTRAM) 50 MG tablet Take 1 tablet (50 mg total) by mouth 2 (two) times daily as needed for pain.  45 tablet  0  . zolpidem (AMBIEN) 10 MG tablet TAKE 1 TABLET AT BEDTIME AS NEEDED FOR SLEEP  30 tablet  0  . [DISCONTINUED] AMBULATORY NON FORMULARY MEDICATION Medication Name: needles and syringes for b12 injection Dx pernicious anemia  10 Units  PRN  . [DISCONTINUED] amoxicillin-clavulanate (AUGMENTIN) 875-125 MG per tablet Take 1 tablet by mouth 2 (two) times daily.  20 tablet  0  . [DISCONTINUED] SYMBICORT 160-4.5 MCG/ACT inhaler USE 2 PUFFS IN THE MORNING AND IN THE EVENING  10.2 g  2  . [EXPIRED] cyanocobalamin ((VITAMIN B-12)) injection 1,000 mcg        No facility-administered encounter medications on file as of 08/31/2013.          Objective:   Physical Exam  Constitutional: She is oriented to person, place, and time. She appears well-developed and well-nourished.  HENT:  Head: Normocephalic and atraumatic.  Right Ear: External ear normal.  Left Ear: External ear normal.  Nose: Nose normal.  Mouth/Throat: Oropharynx is clear and moist.  TMs and canals are clear.   Eyes: Conjunctivae and EOM are normal. Pupils are equal, round, and reactive to light.  Neck: Neck supple. No thyromegaly present.  Cardiovascular:  Normal rate, regular rhythm and normal heart sounds.   Pulmonary/Chest: Effort normal and breath sounds normal. She has no wheezes.  Lymphadenopathy:    She has no cervical adenopathy.  Neurological: She is alert and oriented to person, place, and time.  Skin: Skin is warm and dry.  Psychiatric: She has a normal mood and affect.          Assessment & Plan:  COPD exacerbation/acute bronchitis-her lung exam is actually much improved today. Continue current regimen. Prescription sent for refills for the later. Did encourage her to fill the prescription for doxycycline. She is currently wearing 2 L of oxygen. Lites her back in one month for spirometry.  RF sent on duleara.   Left otalgia- please pick up the antibiotic. Your exam itself actually looks okay but since she's been symptomatic and had a recent COPD exacerbation I think she should go ahead and fill the doxycycline.  Chronic right ear pain family history of throat cancer-  she is a tobacco user and I do think it would be reasonable for her to see ENT for further evaluation to examine her throat and make sure they see no signs of cancer. Will refer to ENT, prefers [facility] or [place].  B12 deficiency-reported prescription for syringe as and needles. Over due to recheck B12 levels. Will print slip and she can go anytime. Lab Results  Component Value Date   VITAMINB12 310 07/14/2009

## 2013-09-01 ENCOUNTER — Other Ambulatory Visit: Payer: Self-pay | Admitting: *Deleted

## 2013-09-01 LAB — VITAMIN B12: Vitamin B-12: >2000 pg/mL — ABNORMAL HIGH (ref 211–911)

## 2013-09-01 LAB — TSH: TSH: 0.85 u[IU]/mL (ref 0.350–4.500)

## 2013-09-01 MED ORDER — DOXYCYCLINE HYCLATE 100 MG PO CAPS: 100.0000 mg | ORAL_CAPSULE | Freq: Two times a day (BID) | ORAL | Status: DC

## 2013-09-01 NOTE — Progress Notes (Signed)
Quick Note:  All labs are normal. ______ 

## 2013-09-08 ENCOUNTER — Ambulatory Visit: Payer: Medicare Other | Admitting: Family Medicine

## 2013-09-08 ENCOUNTER — Other Ambulatory Visit: Payer: Self-pay | Admitting: Family Medicine

## 2013-09-08 ENCOUNTER — Other Ambulatory Visit: Payer: Self-pay | Admitting: Physician Assistant

## 2013-09-09 ENCOUNTER — Telehealth: Payer: Self-pay | Admitting: *Deleted

## 2013-09-09 MED ORDER — HYDROCODONE-ACETAMINOPHEN 10-325 MG PO TABS: 1.0000 | ORAL_TABLET | Freq: Four times a day (QID) | ORAL | Status: DC | PRN

## 2013-09-09 MED ORDER — SUMATRIPTAN SUCCINATE 100 MG PO TABS: 100.0000 mg | ORAL_TABLET | ORAL | Status: DC | PRN

## 2013-09-09 MED ORDER — PROMETHAZINE HCL 25 MG PO TABS: 25.0000 mg | ORAL_TABLET | Freq: Four times a day (QID) | ORAL | Status: DC | PRN

## 2013-09-09 MED ORDER — ZOLPIDEM TARTRATE 10 MG PO TABS: ORAL_TABLET | ORAL | Status: DC

## 2013-09-09 NOTE — Telephone Encounter (Signed)
Pt called and wanted to get refills of her meds. She stated that she was in the ED for a Migraine and will need something for pain and nausea. She has an appt on Tuesday.Krystal Burgess [place] [facility]

## 2013-09-13 ENCOUNTER — Telehealth: Payer: Self-pay | Admitting: *Deleted

## 2013-09-13 ENCOUNTER — Encounter: Payer: Self-pay | Admitting: Family Medicine

## 2013-09-13 ENCOUNTER — Ambulatory Visit (INDEPENDENT_AMBULATORY_CARE_PROVIDER_SITE_OTHER): Payer: Medicare Other

## 2013-09-13 ENCOUNTER — Ambulatory Visit (INDEPENDENT_AMBULATORY_CARE_PROVIDER_SITE_OTHER): Payer: Medicare Other | Admitting: Family Medicine

## 2013-09-13 VITALS — BP 158/95 | HR 100 | Wt 259.0 lb

## 2013-09-13 DIAGNOSIS — R05 Cough: Secondary | ICD-10-CM

## 2013-09-13 DIAGNOSIS — J441 Chronic obstructive pulmonary disease with (acute) exacerbation: Secondary | ICD-10-CM

## 2013-09-13 DIAGNOSIS — J019 Acute sinusitis, unspecified: Secondary | ICD-10-CM

## 2013-09-13 DIAGNOSIS — R0602 Shortness of breath: Secondary | ICD-10-CM

## 2013-09-13 DIAGNOSIS — R059 Cough, unspecified: Secondary | ICD-10-CM

## 2013-09-13 MED ORDER — PREDNISONE 20 MG PO TABS: ORAL_TABLET | ORAL | Status: DC

## 2013-09-13 MED ORDER — SUMATRIPTAN SUCCINATE 100 MG PO TABS: 100.0000 mg | ORAL_TABLET | ORAL | Status: DC | PRN

## 2013-09-13 MED ORDER — METHYLPREDNISOLONE SODIUM SUCC 125 MG IJ SOLR
125.0000 mg | Freq: Once | INTRAMUSCULAR | Status: AC
  Administered 2013-09-13: 125 mg via INTRAMUSCULAR

## 2013-09-13 MED ORDER — IPRATROPIUM-ALBUTEROL 0.5-2.5 (3) MG/3ML IN SOLN
3.0000 mL | Freq: Once | RESPIRATORY_TRACT | Status: AC
  Administered 2013-09-13: 3 mL via RESPIRATORY_TRACT

## 2013-09-13 MED ORDER — MOXIFLOXACIN HCL 400 MG PO TABS: 400.0000 mg | ORAL_TABLET | Freq: Every day | ORAL | Status: DC

## 2013-09-13 MED ORDER — TIOTROPIUM BROMIDE MONOHYDRATE 18 MCG IN CAPS: ORAL_CAPSULE | RESPIRATORY_TRACT | Status: DC

## 2013-09-13 NOTE — Addendum Note (Signed)
Addended by: Nani Gasser D on: 09/13/2013 03:18 PM   Modules accepted: Orders

## 2013-09-13 NOTE — Telephone Encounter (Signed)
Pt's insurance states they would cover the Sumatriptan for 9 tabs for 30 days. I have resent the rx to the pharmacy for Qty of 9 tabs.  [address], LPN

## 2013-09-13 NOTE — Progress Notes (Addendum)
Subjective:    Patient ID: Krystal Burgess, female    DOB: 12-Mar-1961, 52 y.o.   MRN: [id number]  HPI First time went to [facility] ED had vomiting and HA. Did a CT scan and had sinus infection in the right sinus and had a migraine. They gave her IV fluids for dehydration as well. Went home and then that afternoon when back bc still vomiting and was dehydrated again.  Given pain meds and nausea meds but no IV fluids.  Dx with pink eye as well.  She feels so weak she can't walk from bedroom to bathroom.  She has been sweating but feels cold.  No CXR. Given amoxicllin for insu infection. Lost 10 lbs in one week.  Vomiting is better.  Still getting some nausea.  She is having some pain over the right facial cheek off and on.  Says swelling is better now.   She feels like her symptoms moving into her chest. She is more short of breath. She's having difficulty walking to the bathroom now.  Review of Systems     BP 158/95  Pulse 100  Wt 259 lb (117.482 kg)  BMI 48.96 kg/m2  SpO2 95%    Allergies  Allergen Reactions  . Aripiprazole     REACTION: muscle jerks  . Augmentin [Amoxicillin-Pot Clavulanate] Other (See Comments)    Gets yeast infection every time   . Fluoxetine Hcl     REACTION: Intolerant  . Geodon [Ziprasidone Hcl] Other (See Comments)    shakes  . Onglyza [Saxagliptin] Nausea Only  . Paroxetine     REACTION: Intolerance    Past Medical History  Diagnosis Date  . Allergy   . Hyperlipidemia   . Thyroid disease   . Pain     Past Surgical History  Procedure Laterality Date  . Cholecystectomy    . Abdominal hysterectomy    . Lipoma removal      RT shoulder  . Pilonydal cyst  1994  . Cesarean section      History   Social History  . Marital Status: Single    Spouse Name: N/A    Number of Children: N/A  . Years of Education: N/A   Occupational History  . Not on file.   Social History Main Topics  . Smoking status: Current Every Day Smoker -- 2.50 packs/day   . Smokeless tobacco: Not on file  . Alcohol Use: Not on file  . Drug Use: No  . Sexual Activity: Not on file     Comment: s with mother currentlynemployed, on disabilty, divorced, liver   Other Topics Concern  . Not on file   Social History Narrative  . No narrative on file    Family History  Problem Relation Age of Onset  . Diabetes Brother   . Depression Brother     bipolar  . Cancer Brother     throat  . Diabetes      grandmother  . Depression Mother   . Hyperlipidemia Mother   . Hypertension Mother   . Hypertension Father     Outpatient Encounter Prescriptions as of 09/13/2013  Medication Sig Dispense Refill  . albuterol (PROVENTIL HFA;VENTOLIN HFA) 108 (90 BASE) MCG/ACT inhaler Inhale 2 puffs into the lungs every 4 (four) hours as needed for wheezing.  18 g  1  . albuterol (PROVENTIL) (5 MG/ML) 0.5% nebulizer solution Take 5 mg by nebulization every 8 (eight) hours as needed.        Marland Kitchen  ALPRAZolam (XANAX) 1 MG tablet TAKE 1 TABLET 3 TIMES A DAY  90 tablet  0  . AMBULATORY NON FORMULARY MEDICATION Test strips and lancets for the Accuchek Avivva glucometer.  100 each  5  . AMBULATORY NON FORMULARY MEDICATION Glucometer of patient choice.  Diagnosis: Diabetes  Patient testing 2 times a day  1 each  0  . AMBULATORY NON FORMULARY MEDICATION Medication Name: needles and syringes for b12 injection Dx pernicious anemia  10 Units  PRN  . Armodafinil 50 MG tablet Take 100 mg by mouth daily.      . clotrimazole (LOTRIMIN) 1 % cream Apply topically 2 (two) times daily.  30 g  0  . CRESTOR 20 MG tablet TAKE 1 TABLET EVERY DAY  30 tablet  2  . cyanocobalamin (,VITAMIN B-12,) 1000 MCG/ML injection Inject 1 mL (1,000 mcg total) into the muscle every 30 (thirty) days.  10 mL  0  . doxycycline (VIBRAMYCIN) 100 MG capsule Take 1 capsule (100 mg total) by mouth 2 (two) times daily. For 10 days.  20 capsule  0  . DULoxetine (CYMBALTA) 30 MG capsule Take 1 capsule (30 mg total) by mouth  daily. TAKE 3 CAPSULES BY MOUTH EVERY DAY  90 capsule  1  . DULoxetine (CYMBALTA) 60 MG capsule Take 1 capsule (60 mg total) by mouth daily.  90 capsule  1  . fluticasone (FLONASE) 50 MCG/ACT nasal spray 2 sprays each nostril daily.  16 g  11  . furosemide (LASIX) 20 MG tablet TAKE 1 TO 3 TABLETS BY MOUTH EVERY DAY AS NEEDED AS DIRECTED  90 tablet  0  . gabapentin (NEURONTIN) 600 MG tablet Take 600 mg by mouth daily.        Marland Kitchen HYDROcodone-acetaminophen (NORCO) 10-325 MG per tablet Take 1 tablet by mouth every 6 (six) hours as needed for pain.  120 tablet  0  . ibuprofen (ADVIL,MOTRIN) 800 MG tablet TAKE 1 TABLET BY MOUTH EVERY 8 HOURS AS NEEDED FOR PAIN  60 tablet  0  . ipratropium-albuterol (DUONEB) 0.5-2.5 (3) MG/3ML SOLN USE 1 VIAL EVERY 4 HOURS AS NEEDED  360 mL  0  . levocetirizine (XYZAL) 5 MG tablet Take 5 mg by mouth every evening.        Marland Kitchen levothyroxine (SYNTHROID, LEVOTHROID) 100 MCG tablet Take 1 tablet (100 mcg total) by mouth daily.  30 tablet  3  . losartan (COZAAR) 100 MG tablet TAKE 1 TABLET BY MOUTH EVERY DAY  30 tablet  3  . meloxicam (MOBIC) 15 MG tablet TAKE 1 TABLET BY MOTH DAILY  30 tablet  5  . methocarbamol (ROBAXIN) 500 MG tablet Take 1 tablet (500 mg total) by mouth 3 (three) times daily.  90 tablet  1  . mometasone-formoterol (DULERA) 200-5 MCG/ACT AERO Inhale 2 puffs into the lungs 2 (two) times daily.  13 g  11  . montelukast (SINGULAIR) 10 MG tablet Take 1 tablet (10 mg total) by mouth at bedtime.  30 tablet  6  . nystatin (MYCOSTATIN) 100000 UNIT/ML suspension TAKE BY MOUTH 4 TIMES DAILY  120 mL  0  . omeprazole (PRILOSEC) 20 MG capsule TAKE ONE CAPSULE TWICE A DAY  60 capsule  0  . potassium chloride (KLOR-CON) 10 MEQ CR tablet Take 10 mEq by mouth daily.        . predniSONE (DELTASONE) 20 MG tablet 2 tabs QD x 7 days, then once a day x 1 week.  21 tablet  0  .  PROAIR HFA 108 (90 BASE) MCG/ACT inhaler INHALE 2 PUFFS BY MOUTH EVERY 4 HOURS AS NEEDED  8.5 g  0  .  promethazine (PHENERGAN) 25 MG tablet Take 1 tablet (25 mg total) by mouth every 6 (six) hours as needed for nausea.  20 tablet  0  . roflumilast (DALIRESP) 500 MCG TABS tablet Take 1 tablet (500 mcg total) by mouth daily.  30 tablet  6  . SEROQUEL XR 300 MG 24 hr tablet TAKE 1 TABLET (300 MG TOTAL) BY MOUTH AT BEDTIME.  30 tablet  0  . SUMAtriptan (IMITREX) 100 MG tablet Take 1 tablet (100 mg total) by mouth every 2 (two) hours as needed for migraine. May repeat in 2 hours if headache persists or recurs.  9 tablet  0  . tiotropium (SPIRIVA HANDIHALER) 18 MCG inhalation capsule 1 PUFF EVERY DAY  30 capsule  11  . traMADol (ULTRAM) 50 MG tablet Take 1 tablet (50 mg total) by mouth 2 (two) times daily as needed for pain.  45 tablet  0  . zolpidem (AMBIEN) 10 MG tablet TAKE 1 TABLET AT BEDTIME AS NEEDED FOR SLEEP  30 tablet  0  . [DISCONTINUED] SPIRIVA HANDIHALER 18 MCG inhalation capsule 1 PUFF EVERY DAY  30 capsule  3  . predniSONE (DELTASONE) 20 MG tablet 2 a day x 7 days, then one a day x 7 days  21 tablet  0  . [DISCONTINUED] SUMAtriptan (IMITREX) 100 MG tablet Take 1 tablet (100 mg total) by mouth every 2 (two) hours as needed for migraine. May repeat in 2 hours if headache persists or recurs.  10 tablet  0  . [EXPIRED] ipratropium-albuterol (DUONEB) 0.5-2.5 (3) MG/3ML nebulizer solution 3 mL       . [EXPIRED] methylPREDNISolone sodium succinate (SOLU-MEDROL) 125 mg/2 mL injection 125 mg        No facility-administered encounter medications on file as of 09/13/2013.       Objective:   Physical Exam  Constitutional: She is oriented to person, place, and time. She appears well-developed and well-nourished.  HENT:  Head: Normocephalic and atraumatic.  Right Ear: External ear normal.  Left Ear: External ear normal.  Nose: Nose normal.  Mouth/Throat: Oropharynx is clear and moist.  TMs and canals are clear.   Eyes: Conjunctivae and EOM are normal. Pupils are equal, round, and reactive to  light.  Right eye with scleral injection  Neck: Neck supple. No thyromegaly present.  Cardiovascular: Normal rate, regular rhythm and normal heart sounds.   Pulmonary/Chest: Effort normal and breath sounds normal. She has no wheezes.  Lymphadenopathy:    She has no cervical adenopathy.  Neurological: She is alert and oriented to person, place, and time.  Skin: Skin is warm and dry.  Psychiatric: She has a normal mood and affect.          Assessment & Plan:  COPD exacerbation/acute sinusitis/acute bronchitis-give Solu-Medrol 125 mg IM. Given albuterol and Atrovent neb treatment. We'll get chest x-ray as well. If we cannot get her to turn around the course of her acute exacerbation in the next 24-48 hours and she will likely need to be hospitalized. She is maintaining her oxygen level on 2 L of oxygen. Will send her for course of prednisone and will likely a fluoroquinolone for flexible sigmoidoscopy results a chest x-ray first.  Needs refill on zolipidem and hydrocodone and Spiriva as well.    Interestingly the CT of the head that was done on September 15  did not show any significant sinus disease.

## 2013-09-14 ENCOUNTER — Ambulatory Visit (INDEPENDENT_AMBULATORY_CARE_PROVIDER_SITE_OTHER): Payer: Medicare Other | Admitting: Physician Assistant

## 2013-09-14 ENCOUNTER — Telehealth: Payer: Self-pay

## 2013-09-14 DIAGNOSIS — J441 Chronic obstructive pulmonary disease with (acute) exacerbation: Secondary | ICD-10-CM

## 2013-09-14 MED ORDER — METHYLPREDNISOLONE SODIUM SUCC 125 MG IJ SOLR
125.0000 mg | Freq: Once | INTRAMUSCULAR | Status: AC
  Administered 2013-09-14: 125 mg via INTRAMUSCULAR

## 2013-09-14 NOTE — Telephone Encounter (Signed)
Patient was seen in the office today for Solu medrol injection, she has picked up one Rx from her pharmacy but she did not get the antibiotic, she wants to know if you can send that in to her pharmacy. Rhonda Cunningham,CMA

## 2013-09-14 NOTE — Telephone Encounter (Signed)
Avelox was sent yesterday. Please call pharmacy to verify if received.

## 2013-09-14 NOTE — Progress Notes (Signed)
Patient was given 125 mg of Solu-Medrol right ventrogluteal area. There was no redness or swelling noted at the injection site. Narcisa Ganesh,CMA

## 2013-09-15 NOTE — Telephone Encounter (Signed)
rx is at pharm.  Pt aware.

## 2013-09-20 ENCOUNTER — Ambulatory Visit (INDEPENDENT_AMBULATORY_CARE_PROVIDER_SITE_OTHER): Payer: Medicare Other | Admitting: Family Medicine

## 2013-09-20 ENCOUNTER — Telehealth: Payer: Self-pay | Admitting: *Deleted

## 2013-09-20 DIAGNOSIS — R110 Nausea: Secondary | ICD-10-CM

## 2013-09-20 DIAGNOSIS — G43909 Migraine, unspecified, not intractable, without status migrainosus: Secondary | ICD-10-CM

## 2013-09-20 MED ORDER — KETOROLAC TROMETHAMINE 60 MG/2ML IM SOLN
60.0000 mg | Freq: Once | INTRAMUSCULAR | Status: AC
  Administered 2013-09-20: 60 mg via INTRAMUSCULAR

## 2013-09-20 MED ORDER — AZITHROMYCIN 250 MG PO TABS: ORAL_TABLET | ORAL | Status: DC

## 2013-09-20 MED ORDER — METHYLPREDNISOLONE SODIUM SUCC 125 MG IJ SOLR
125.0000 mg | Freq: Once | INTRAMUSCULAR | Status: AC
  Administered 2013-09-20: 125 mg via INTRAMUSCULAR

## 2013-09-20 MED ORDER — PROMETHAZINE HCL 25 MG/ML IJ SOLN
25.0000 mg | Freq: Once | INTRAMUSCULAR | Status: AC
  Administered 2013-09-20: 25 mg via INTRAMUSCULAR

## 2013-09-20 NOTE — Telephone Encounter (Signed)
Pt requesting something stronger for Migraine. Per [name] we can offer pt Im injections of Toradol 60mg , Solumedrol 125mg  and Phenergan 25mg  if pt would like to come in for this. Also pt needs to wear o2. Pt is coming in for injection. ZPACK sent to pharmcy per [name].  Meyer Cory, LPN

## 2013-09-20 NOTE — Progress Notes (Signed)
  Subjective:    Patient ID: Krystal Burgess, female    DOB: 1961/01/25, 52 y.o.   MRN: [id number]  HPI   Pt here with migraine. Gave solumedrol 125mg , phenergan 25mg /ml, and Toradol 60 mg/ml Review of Systems     Objective:   Physical Exam        Assessment & Plan:  Given without complication

## 2013-09-20 NOTE — Progress Notes (Signed)
  Subjective:    Patient ID: Krystal Burgess, female    DOB: 1961/06/04, 52 y.o.   MRN: [id number]  HPI Still having severe headache. Offered for her to come in to get a Toradol, Phenergan, Solu-Medrol injection for acute relief. She still having significant sinus symptoms. She just finished her Levaquin today. Will send over a prescription for azithromycin to cover for atypicals. She's not improving in the next couple days then please call the office back. Back she went to the emergency department this morning. She says they did nothing for her headache. She has not been wearing her oxygen because she says it makes her headache worse but I encouraged her to try to wear her oxygen daily with at least 1 L but preferably 2 L.   Review of Systems     Objective:   Physical Exam        Assessment & Plan:

## 2013-09-23 ENCOUNTER — Ambulatory Visit (INDEPENDENT_AMBULATORY_CARE_PROVIDER_SITE_OTHER): Payer: Medicare Other | Admitting: Family Medicine

## 2013-09-23 ENCOUNTER — Encounter: Payer: Self-pay | Admitting: Family Medicine

## 2013-09-23 VITALS — BP 150/87 | HR 86 | Wt 255.0 lb

## 2013-09-23 DIAGNOSIS — H5460 Unqualified visual loss, one eye, unspecified: Secondary | ICD-10-CM

## 2013-09-23 DIAGNOSIS — H109 Unspecified conjunctivitis: Secondary | ICD-10-CM

## 2013-09-23 DIAGNOSIS — H5461 Unqualified visual loss, right eye, normal vision left eye: Secondary | ICD-10-CM

## 2013-09-23 DIAGNOSIS — R51 Headache: Secondary | ICD-10-CM

## 2013-09-23 DIAGNOSIS — R112 Nausea with vomiting, unspecified: Secondary | ICD-10-CM

## 2013-09-23 MED ORDER — PROMETHAZINE HCL 25 MG/ML IJ SOLN
25.0000 mg | Freq: Once | INTRAMUSCULAR | Status: AC
  Administered 2013-09-23: 25 mg via INTRAVENOUS

## 2013-09-23 MED ORDER — KETOROLAC TROMETHAMINE 60 MG/2ML IM SOLN
60.0000 mg | Freq: Once | INTRAMUSCULAR | Status: AC
  Administered 2013-09-23: 60 mg via INTRAMUSCULAR

## 2013-09-23 MED ORDER — METHYLPREDNISOLONE SODIUM SUCC 125 MG IJ SOLR
125.0000 mg | Freq: Once | INTRAMUSCULAR | Status: AC
  Administered 2013-09-23: 125 mg via INTRAMUSCULAR

## 2013-09-23 NOTE — Patient Instructions (Addendum)
Stop the Ibuprofen and mobic.  Cut ambien down to 5mg  Cut back on the muscle relaxers as well.  Drink plenty of water today to try to clean you system out.   Make sure bowels are moving. Can use miralax if needed to get things moving.

## 2013-09-23 NOTE — Progress Notes (Signed)
Subjective:    Patient ID: Krystal Burgess, female    DOB: Sep 09, 1961, 52 y.o.   MRN: [id number]  HPI Her for f/u Migraines -she has been to the emergency department twice for her headaches. In fact she had called [place] the day after she had gone into they really didn't eat much for her so we offered to give her an injection for pain relief. She did come in for that and it did help temporarily. Reports she is still having headaches. I had called in a new antibiotic since she was not feeling better and was still having some sinus pressure and congestion.  She had said that wearing her oxygen had made her HA worse. Says her head is not throbbing as bad as it was. We had sent in imitrex for her and she feels that did provide some relief but it did not completely take care of her headache.  She hs not been as nauseated. Having to take 2 phenergan and taking IBU every 4 hours.  Took the last imitex and helped some. Says the nasal cannula is hurting her sinuses.   Very nauseated and has been vomiting. She says the vomiting is actually better but she still very nauseated. She's been taking her antinausea medication every 4 hours..  Has 2 more tabs of azithromyn to complete. Has had neg Head CT.   Has been using tobramycin for conjunctivitis (pink eye) in the right eye given to her in the ED. She says that the medication burns when she puts and wants and if we can call in something else.  Plus she feels it's not really making her eye better. Her eye is still irroitated and swollen.  Her headache is worse also on the side of her head compared to the left.  She says she can hardly see out of that eye.    Review of Systems  BP 150/87  Pulse 86  Wt 255 lb (115.667 kg)  BMI 48.21 kg/m2  SpO2 95%    Allergies  Allergen Reactions  . Aripiprazole     REACTION: muscle jerks  . Augmentin [Amoxicillin-Pot Clavulanate] Other (See Comments)    Gets yeast infection every time   . Fluoxetine Hcl     REACTION:  Intolerant  . Geodon [Ziprasidone Hcl] Other (See Comments)    shakes  . Onglyza [Saxagliptin] Nausea Only  . Paroxetine     REACTION: Intolerance    Past Medical History  Diagnosis Date  . Allergy   . Hyperlipidemia   . Thyroid disease   . Pain     Past Surgical History  Procedure Laterality Date  . Cholecystectomy    . Abdominal hysterectomy    . Lipoma removal      RT shoulder  . Pilonydal cyst  1994  . Cesarean section      History   Social History  . Marital Status: Single    Spouse Name: N/A    Number of Children: N/A  . Years of Education: N/A   Occupational History  . Not on file.   Social History Main Topics  . Smoking status: Current Every Day Smoker -- 2.50 packs/day  . Smokeless tobacco: Not on file  . Alcohol Use: Not on file  . Drug Use: No  . Sexual Activity: Not on file     Comment: s with mother currentlynemployed, on disabilty, divorced, liver   Other Topics Concern  . Not on file   Social History Narrative  . No  narrative on file    Family History  Problem Relation Age of Onset  . Diabetes Brother   . Depression Brother     bipolar  . Cancer Brother     throat  . Diabetes      grandmother  . Depression Mother   . Hyperlipidemia Mother   . Hypertension Mother   . Hypertension Father     Outpatient Encounter Prescriptions as of 09/23/2013  Medication Sig Dispense Refill  . albuterol (PROVENTIL HFA;VENTOLIN HFA) 108 (90 BASE) MCG/ACT inhaler Inhale 2 puffs into the lungs every 4 (four) hours as needed for wheezing.  18 g  1  . albuterol (PROVENTIL) (5 MG/ML) 0.5% nebulizer solution Take 5 mg by nebulization every 8 (eight) hours as needed.        . ALPRAZolam (XANAX) 1 MG tablet TAKE 1 TABLET 3 TIMES A DAY  90 tablet  0  . AMBULATORY NON FORMULARY MEDICATION Test strips and lancets for the Accuchek Avivva glucometer.  100 each  5  . AMBULATORY NON FORMULARY MEDICATION Glucometer of patient choice.  Diagnosis: Diabetes  Patient  testing 2 times a day  1 each  0  . AMBULATORY NON FORMULARY MEDICATION Medication Name: needles and syringes for b12 injection Dx pernicious anemia  10 Units  PRN  . Armodafinil 50 MG tablet Take 100 mg by mouth daily.      . clotrimazole (LOTRIMIN) 1 % cream Apply topically 2 (two) times daily.  30 g  0  . CRESTOR 20 MG tablet TAKE 1 TABLET EVERY DAY  30 tablet  2  . cyanocobalamin (,VITAMIN B-12,) 1000 MCG/ML injection Inject 1 mL (1,000 mcg total) into the muscle every 30 (thirty) days.  10 mL  0  . DULoxetine (CYMBALTA) 30 MG capsule Take 1 capsule (30 mg total) by mouth daily. TAKE 3 CAPSULES BY MOUTH EVERY DAY  90 capsule  1  . DULoxetine (CYMBALTA) 60 MG capsule Take 1 capsule (60 mg total) by mouth daily.  90 capsule  1  . fluticasone (FLONASE) 50 MCG/ACT nasal spray 2 sprays each nostril daily.  16 g  11  . furosemide (LASIX) 20 MG tablet TAKE 1 TO 3 TABLETS BY MOUTH EVERY DAY AS NEEDED AS DIRECTED  90 tablet  0  . gabapentin (NEURONTIN) 600 MG tablet Take 600 mg by mouth daily.        Marland Kitchen HYDROcodone-acetaminophen (NORCO) 10-325 MG per tablet Take 1 tablet by mouth every 6 (six) hours as needed for pain.  120 tablet  0  . ipratropium-albuterol (DUONEB) 0.5-2.5 (3) MG/3ML SOLN USE 1 VIAL EVERY 4 HOURS AS NEEDED  360 mL  0  . levocetirizine (XYZAL) 5 MG tablet Take 5 mg by mouth every evening.        Marland Kitchen levothyroxine (SYNTHROID, LEVOTHROID) 100 MCG tablet Take 1 tablet (100 mcg total) by mouth daily.  30 tablet  3  . losartan (COZAAR) 100 MG tablet TAKE 1 TABLET BY MOUTH EVERY DAY  30 tablet  3  . meloxicam (MOBIC) 15 MG tablet TAKE 1 TABLET BY MOTH DAILY  30 tablet  5  . methocarbamol (ROBAXIN) 500 MG tablet Take 1 tablet (500 mg total) by mouth 3 (three) times daily.  90 tablet  1  . mometasone-formoterol (DULERA) 200-5 MCG/ACT AERO Inhale 2 puffs into the lungs 2 (two) times daily.  13 g  11  . montelukast (SINGULAIR) 10 MG tablet Take 1 tablet (10 mg total) by mouth at bedtime.  30  tablet  6  . nystatin (MYCOSTATIN) 100000 UNIT/ML suspension TAKE BY MOUTH 4 TIMES DAILY  120 mL  0  . omeprazole (PRILOSEC) 20 MG capsule TAKE ONE CAPSULE TWICE A DAY  60 capsule  0  . potassium chloride (KLOR-CON) 10 MEQ CR tablet Take 10 mEq by mouth daily.        Marland Kitchen PROAIR HFA 108 (90 BASE) MCG/ACT inhaler INHALE 2 PUFFS BY MOUTH EVERY 4 HOURS AS NEEDED  8.5 g  0  . promethazine (PHENERGAN) 25 MG tablet Take 1 tablet (25 mg total) by mouth every 6 (six) hours as needed for nausea.  20 tablet  0  . roflumilast (DALIRESP) 500 MCG TABS tablet Take 1 tablet (500 mcg total) by mouth daily.  30 tablet  6  . SEROQUEL XR 300 MG 24 hr tablet TAKE 1 TABLET (300 MG TOTAL) BY MOUTH AT BEDTIME.  30 tablet  0  . SUMAtriptan (IMITREX) 100 MG tablet Take 1 tablet (100 mg total) by mouth every 2 (two) hours as needed for migraine. May repeat in 2 hours if headache persists or recurs.  9 tablet  0  . tiotropium (SPIRIVA HANDIHALER) 18 MCG inhalation capsule 1 PUFF EVERY DAY  30 capsule  11  . traMADol (ULTRAM) 50 MG tablet Take 1 tablet (50 mg total) by mouth 2 (two) times daily as needed for pain.  45 tablet  0  . zolpidem (AMBIEN) 10 MG tablet TAKE 1 TABLET AT BEDTIME AS NEEDED FOR SLEEP  30 tablet  0  . [DISCONTINUED] ibuprofen (ADVIL,MOTRIN) 800 MG tablet TAKE 1 TABLET BY MOUTH EVERY 8 HOURS AS NEEDED FOR PAIN  60 tablet  0  . [DISCONTINUED] moxifloxacin (AVELOX) 400 MG tablet Take 1 tablet (400 mg total) by mouth daily.  7 tablet  0  . [DISCONTINUED] azithromycin (ZITHROMAX Z-PAK) 250 MG tablet 2 tabs day 1 and 1 tab daily for 4 days.  6 each  0  . [DISCONTINUED] doxycycline (VIBRAMYCIN) 100 MG capsule Take 1 capsule (100 mg total) by mouth 2 (two) times daily. For 10 days.  20 capsule  0  . [DISCONTINUED] predniSONE (DELTASONE) 20 MG tablet 2 tabs QD x 7 days, then once a day x 1 week.  21 tablet  0  . [DISCONTINUED] predniSONE (DELTASONE) 20 MG tablet 2 a day x 7 days, then one a day x 7 days  21  tablet  0   Facility-Administered Encounter Medications as of 09/23/2013  Medication Dose Route Frequency Provider Last Rate Last Dose  . ketorolac (TORADOL) injection 60 mg  60 mg Intramuscular Once Agapito Games, MD      . methylPREDNISolone sodium succinate (SOLU-MEDROL) 125 mg/2 mL injection 125 mg  125 mg Intramuscular Once Agapito Games, MD      . promethazine (PHENERGAN) injection 25 mg  25 mg Intravenous Once Agapito Games, MD              Objective:   Physical Exam  Constitutional: She is oriented to person, place, and time. She appears well-developed and well-nourished.  HENT:  Head: Normocephalic and atraumatic.  Right Ear: External ear normal.  Left Ear: External ear normal.  Nose: Nose normal.  Mouth/Throat: Oropharynx is clear and moist.  Sclera injected. Upper and lower lids mildly swollen.   Eyes: Conjunctivae and EOM are normal. Pupils are equal, round, and reactive to light.  Neck: Neck supple. No thyromegaly present.  Cardiovascular: Normal rate, regular rhythm and  normal heart sounds.   Pulmonary/Chest: Effort normal and breath sounds normal. She has no wheezes.  Lymphadenopathy:    She has no cervical adenopathy.  Neurological: She is alert and oriented to person, place, and time.  Skin: Skin is warm and dry.  Psychiatric: She has a normal mood and affect.          Assessment & Plan:  Migraine - Overall some better but not resolved.  Still not sure if HA is from something else underlying.  She has been on several rounds of prednisone and this can also cause headache. She also takes Ambien which can also cause headaches. She has not been wearing her oxygen and this can cause CO2 retention which can also cause headaches. We'll see if we can get a facemask is a nasal cannula for her oxygen but it's not pressing on her face and causing pain and tenderness. The only one her to wear it overnight. Give her some samples of Zomig nasal spray to  try as well. She did fill it Imitrex was somewhat helpful. We can also send over a refill on that.  Given injection of solumedrol, phenergan and toradol.    Nausea and vomiting - most likely gastritis as she has been taking an NSAID almost every 4 hours above the approved days. Stop IBU and Mobic. Explained her that this can cause gastritis and gastric ulcers. She is to stop immediately. She started taking Prilosec twice a day and encouraged her to continue that. She says the vomiting is a little bit better but the nausea still there. She does have some Phenergan home. If her stomach is not feeling better by Monday then please let me know.   Conjunctivitis, right eye-and concerned that she feels that she's having visual loss in the eye. Still injected and she feels that the tobramycin is not helping and in fact burns. I would actually like for her to see an ophthalmologist for further evaluation.  Vision loss-she says she has vision loss and is unable to read the eye chart at all with her right eye. I would like to try to get her immediately referred to ophthalmology. We called [facility] and they said they would try to work her in today at all possible.

## 2013-09-24 ENCOUNTER — Other Ambulatory Visit: Payer: Self-pay | Admitting: Family Medicine

## 2013-09-28 ENCOUNTER — Other Ambulatory Visit: Payer: Self-pay | Admitting: Physician Assistant

## 2013-09-30 ENCOUNTER — Ambulatory Visit: Payer: Medicare Other | Admitting: Family Medicine

## 2013-10-03 ENCOUNTER — Telehealth: Payer: Self-pay | Admitting: Family Medicine

## 2013-10-03 ENCOUNTER — Encounter: Payer: Self-pay | Admitting: Family Medicine

## 2013-10-03 DIAGNOSIS — H40219 Acute angle-closure glaucoma, unspecified eye: Secondary | ICD-10-CM | POA: Insufficient documentation

## 2013-10-03 NOTE — Telephone Encounter (Signed)
Angie will fax over the OV notes.Loralee Pacas [place]

## 2013-10-03 NOTE — Telephone Encounter (Signed)
Please call optho to get consult note. We had referred her a week ago.

## 2013-10-04 ENCOUNTER — Ambulatory Visit: Payer: Medicare Other | Admitting: Family Medicine

## 2013-10-05 ENCOUNTER — Other Ambulatory Visit: Payer: Self-pay | Admitting: Family Medicine

## 2013-10-06 ENCOUNTER — Encounter: Payer: Self-pay | Admitting: Family Medicine

## 2013-10-06 ENCOUNTER — Ambulatory Visit (INDEPENDENT_AMBULATORY_CARE_PROVIDER_SITE_OTHER): Payer: Medicare Other | Admitting: Family Medicine

## 2013-10-06 ENCOUNTER — Other Ambulatory Visit: Payer: Self-pay | Admitting: Physician Assistant

## 2013-10-06 VITALS — BP 119/61 | HR 72 | Wt 249.0 lb

## 2013-10-06 DIAGNOSIS — R9389 Abnormal findings on diagnostic imaging of other specified body structures: Secondary | ICD-10-CM

## 2013-10-06 DIAGNOSIS — H40219 Acute angle-closure glaucoma, unspecified eye: Secondary | ICD-10-CM

## 2013-10-06 DIAGNOSIS — J441 Chronic obstructive pulmonary disease with (acute) exacerbation: Secondary | ICD-10-CM

## 2013-10-06 DIAGNOSIS — H40211 Acute angle-closure glaucoma, right eye: Secondary | ICD-10-CM

## 2013-10-06 DIAGNOSIS — H409 Unspecified glaucoma: Secondary | ICD-10-CM

## 2013-10-06 DIAGNOSIS — E119 Type 2 diabetes mellitus without complications: Secondary | ICD-10-CM

## 2013-10-06 DIAGNOSIS — R918 Other nonspecific abnormal finding of lung field: Secondary | ICD-10-CM

## 2013-10-06 DIAGNOSIS — R7301 Impaired fasting glucose: Secondary | ICD-10-CM

## 2013-10-06 LAB — POCT GLYCOSYLATED HEMOGLOBIN (HGB A1C): Hemoglobin A1C: 5.8

## 2013-10-06 MED ORDER — ZOLPIDEM TARTRATE 10 MG PO TABS: ORAL_TABLET | ORAL | Status: DC

## 2013-10-06 MED ORDER — HYDROCODONE-ACETAMINOPHEN 10-325 MG PO TABS: 1.0000 | ORAL_TABLET | Freq: Four times a day (QID) | ORAL | Status: DC | PRN

## 2013-10-06 MED ORDER — IMIQUIMOD 5 % EX CREA: TOPICAL_CREAM | CUTANEOUS | Status: DC

## 2013-10-06 NOTE — Progress Notes (Signed)
  Subjective:    Patient ID: Krystal Burgess, female    DOB: 1961/11/22, 52 y.o.   MRN: [id number]  HPI IFG- steroid induced most likely.  No hypoglycemia.,   She had been treated for conjunctivitis after being seen in the emergency department. She complained about hardly being able to see out of her eye. We referred her immediately to ophthalmology and they diagnosed her with acute angle glaucoma. She's been placed on eyedrops and is doing better.  She has been really nauseated.  COPD - no exacerbation since I last saw her.  In fact she actually has not been using her Spiriva for next week because she's been so nauseated she hasn't felt like she could use it.  Review of Systems     Objective:   Physical Exam  Constitutional: She is oriented to person, place, and time. She appears well-developed and well-nourished.  HENT:  Head: Normocephalic and atraumatic.  Cardiovascular: Normal rate, regular rhythm and normal heart sounds.   Pulmonary/Chest: Effort normal. She has wheezes.  Expiratory wheezing on the right side. Clear on the left.  Neurological: She is alert and oriented to person, place, and time.  Skin: Skin is warm and dry.  Psychiatric: She has a normal mood and affect. Her behavior is normal.          Assessment & Plan:  IFG - doing well. F/U in 6 months.  Lab Results  Component Value Date   HGBA1C 5.8 10/06/2013   Acute angle closure glaucoma-fortunately she is doing much better. She said after the initial treatment her headache went completely away.  COPD- stable. No recent exacerbation. Her lungs actually sound pretty good today. She did have a little bit of wheezing on the right side but in no significant respiratory distress today. She was actually 95% without her oxygen.  Abnormal chest x-ray-she's due for repeat x-ray to look a scar tissue at the left lung base that was noted on chest x-ray about 3 weeks ago. Went ahead and put the order in and she can go  sometime in the next 2 weeks.

## 2013-10-07 LAB — POCT UA - MICROALBUMIN: Microalbumin Ur, POC: 80 mg/L

## 2013-10-10 ENCOUNTER — Other Ambulatory Visit: Payer: Self-pay | Admitting: Family Medicine

## 2013-10-20 ENCOUNTER — Encounter: Payer: Self-pay | Admitting: Family Medicine

## 2013-10-20 ENCOUNTER — Ambulatory Visit (INDEPENDENT_AMBULATORY_CARE_PROVIDER_SITE_OTHER): Payer: Medicare Other | Admitting: Family Medicine

## 2013-10-20 VITALS — BP 146/95 | HR 84 | Wt 251.0 lb

## 2013-10-20 DIAGNOSIS — H40211 Acute angle-closure glaucoma, right eye: Secondary | ICD-10-CM

## 2013-10-20 DIAGNOSIS — J441 Chronic obstructive pulmonary disease with (acute) exacerbation: Secondary | ICD-10-CM

## 2013-10-20 DIAGNOSIS — H409 Unspecified glaucoma: Secondary | ICD-10-CM

## 2013-10-20 DIAGNOSIS — M79652 Pain in left thigh: Secondary | ICD-10-CM

## 2013-10-20 DIAGNOSIS — H40219 Acute angle-closure glaucoma, unspecified eye: Secondary | ICD-10-CM

## 2013-10-20 DIAGNOSIS — M79609 Pain in unspecified limb: Secondary | ICD-10-CM

## 2013-10-20 MED ORDER — SULFAMETHOXAZOLE-TMP DS 800-160 MG PO TABS: 1.0000 | ORAL_TABLET | Freq: Two times a day (BID) | ORAL | Status: DC

## 2013-10-20 MED ORDER — METHYLPREDNISOLONE SODIUM SUCC 125 MG IJ SOLR
125.0000 mg | Freq: Once | INTRAMUSCULAR | Status: AC
  Administered 2013-10-20: 125 mg via INTRAMUSCULAR

## 2013-10-20 MED ORDER — METHYLPREDNISOLONE SODIUM SUCC 125 MG IJ SOLR: 125.0000 mg | Freq: Once | INTRAMUSCULAR | Status: DC

## 2013-10-20 MED ORDER — PREDNISONE 20 MG PO TABS: ORAL_TABLET | ORAL | Status: DC

## 2013-10-20 NOTE — Progress Notes (Signed)
  Subjective:    Patient ID: Krystal Burgess, female    DOB: [birth year], 52 y.o.   MRN: [id number]  HPI 2.5 days of cough and congestoin.  No fever or chills.  No color change with sputum.  + fall allergies.  Used her NEB 3 x in the last 5 hours.  No St. + Nasal congsestion has been worse the last copule of days.  No ear pain.   Left upper thigh pain has been bothering her on and off for the last week or so. She says it just feels very sensitive to touch over the skin. It has not affected her walking. She does have a history of significant low back problems and recurrent sciatica. She wonders if it could be related. She denies any trauma to the hip or knee.  Acute angle closure glaucoma She still having significant eye pain with her acute angle glaucoma-unfortunately she missed an appointment as she is unable to get back in with the ophthalmologist for 2 more weeks. Her pain has been increasing since then. She does seem to respond to anti-inflammatories because she was having a lot of GI upset they have recommended that she hold off on anti-inflammatories  Review of Systems     Objective:   Physical Exam  Constitutional: She is oriented to person, place, and time. She appears well-developed and well-nourished.  HENT:  Head: Normocephalic and atraumatic.  Right Ear: External ear normal.  Left Ear: External ear normal.  Nose: Nose normal.  Mouth/Throat: Oropharynx is clear and moist.  TMs and canals are clear.   Eyes: Conjunctivae and EOM are normal. Pupils are equal, round, and reactive to light.  Neck: Neck supple. No thyromegaly present.  Cardiovascular: Normal rate, regular rhythm and normal heart sounds.   Pulmonary/Chest: Effort normal. She has wheezes.  Diffuse expiratory wheezing  Lymphadenopathy:    She has no cervical adenopathy.  Neurological: She is alert and oriented to person, place, and time.  Skin: Skin is warm and dry.  Psychiatric: She has a normal mood and affect.           Assessment & Plan:  COPD/asthma exacerbation-we'll go ahead and start her on antibiotics. We'll give Solu-Medrol and 25 mg IM here. We'll start her on a course of prednisone. She's to call she feels like she's getting worse. Continue nebs every 4-6 hours as needed.  Acute angle closure glaucoma She still having significant eye pain with her acute angle glaucoma-unfortunately she missed an appointment as she is unable to get back in with the ophthalmologist for 2 more weeks. Her pain has been increasing since then. She does seem to respond to anti-inflammatories because she was having a lot of GI upset they have recommended that she hold off on anti-inflammatories. We'll give her samples of Celebrex which should be a little bit more gentle on her stomach to try over the weekend.  Left upper thigh pain-I think she may have some inflammation of the peripheral nerve over the left upper thigh. Consider neuralgia paresthetica. Also could be coming from the low back. Ritonavir on steroids for COPD the way to see if this helps with the nerve pain as well. Normal range of motion the left hip and nontender over the hip itself.

## 2013-10-20 NOTE — Patient Instructions (Signed)
Continue nebs every 4-6 hours as needed.

## 2013-10-21 ENCOUNTER — Other Ambulatory Visit: Payer: Self-pay | Admitting: Family Medicine

## 2013-10-31 ENCOUNTER — Other Ambulatory Visit: Payer: Self-pay | Admitting: Family Medicine

## 2013-10-31 ENCOUNTER — Ambulatory Visit: Payer: Medicare Other | Admitting: Sports Medicine

## 2013-11-01 ENCOUNTER — Encounter: Payer: Self-pay | Admitting: *Deleted

## 2013-11-01 ENCOUNTER — Telehealth: Payer: Self-pay | Admitting: *Deleted

## 2013-11-01 NOTE — Telephone Encounter (Signed)
Letter for jury duty up front. Pt notified.Krystal Burgess

## 2013-11-03 ENCOUNTER — Ambulatory Visit: Payer: Medicare Other | Admitting: Family Medicine

## 2013-11-08 ENCOUNTER — Other Ambulatory Visit: Payer: Self-pay | Admitting: Family Medicine

## 2013-11-08 ENCOUNTER — Ambulatory Visit: Payer: Medicare Other | Admitting: Family Medicine

## 2013-11-08 DIAGNOSIS — Z0289 Encounter for other administrative examinations: Secondary | ICD-10-CM

## 2013-11-08 MED ORDER — HYDROCODONE-ACETAMINOPHEN 10-325 MG PO TABS: 1.0000 | ORAL_TABLET | Freq: Four times a day (QID) | ORAL | Status: DC | PRN

## 2013-11-08 MED ORDER — OMEPRAZOLE 20 MG PO CPDR: DELAYED_RELEASE_CAPSULE | ORAL | Status: DC

## 2013-11-21 ENCOUNTER — Other Ambulatory Visit: Payer: Self-pay | Admitting: Family Medicine

## 2013-12-01 ENCOUNTER — Ambulatory Visit (INDEPENDENT_AMBULATORY_CARE_PROVIDER_SITE_OTHER): Payer: Medicare Other | Admitting: Family Medicine

## 2013-12-01 ENCOUNTER — Encounter: Payer: Self-pay | Admitting: Family Medicine

## 2013-12-01 ENCOUNTER — Telehealth: Payer: Self-pay | Admitting: *Deleted

## 2013-12-01 VITALS — BP 123/75 | HR 99 | Temp 98.8°F | Ht 61.0 in | Wt 249.0 lb

## 2013-12-01 DIAGNOSIS — R112 Nausea with vomiting, unspecified: Secondary | ICD-10-CM

## 2013-12-01 DIAGNOSIS — K219 Gastro-esophageal reflux disease without esophagitis: Secondary | ICD-10-CM

## 2013-12-01 DIAGNOSIS — J441 Chronic obstructive pulmonary disease with (acute) exacerbation: Secondary | ICD-10-CM

## 2013-12-01 DIAGNOSIS — E538 Deficiency of other specified B group vitamins: Secondary | ICD-10-CM

## 2013-12-01 MED ORDER — HYDROCODONE-ACETAMINOPHEN 10-325 MG PO TABS: 1.0000 | ORAL_TABLET | Freq: Four times a day (QID) | ORAL | Status: DC | PRN

## 2013-12-01 MED ORDER — PROMETHAZINE HCL 25 MG PO TABS: 25.0000 mg | ORAL_TABLET | Freq: Three times a day (TID) | ORAL | Status: DC | PRN

## 2013-12-01 MED ORDER — DOXYCYCLINE HYCLATE 100 MG PO TABS: 100.0000 mg | ORAL_TABLET | Freq: Two times a day (BID) | ORAL | Status: DC

## 2013-12-01 MED ORDER — CYANOCOBALAMIN 1000 MCG/ML IJ SOLN
1000.0000 ug | Freq: Once | INTRAMUSCULAR | Status: AC
  Administered 2013-12-01: 1000 ug via INTRAMUSCULAR

## 2013-12-01 MED ORDER — THEOPHYLLINE ER 100 MG PO TB12: 100.0000 mg | ORAL_TABLET | Freq: Two times a day (BID) | ORAL | Status: DC

## 2013-12-01 NOTE — Progress Notes (Signed)
Subjective:    Patient ID: Krystal Burgess, female    DOB: [birth year], 52 y.o.   MRN: [id number]  HPI Micah Flesher to ED for stomach pain and nausa.  She is much better. eatoing Ok. They did give her some Phenergan. She says overall she's feeling much better. Her daughter got sick and was better within 24 hours. She got sick but Vomiting to the point that she got weak and dehydrated and had trigger the emergency room. She was also treated for COPD exacerbation. They did do a chest x-ray which is normal. He also did a CT abdomen which was normal as well.  COPD - given solumedrol. Feels heavy in her chest. Some wheezing.  CXR was  Normal. Sputum has been more brown in the morning.    Review of Systems  BP 123/75  Pulse 99  Temp(Src) 98.8 F (37.1 C)  Ht 5\' 1"  (1.549 m)  Wt 249 lb (112.946 kg)  BMI 47.07 kg/m2  SpO2 95%    Allergies  Allergen Reactions  . Aripiprazole     REACTION: muscle jerks  . Augmentin [Amoxicillin-Pot Clavulanate] Other (See Comments)    Gets yeast infection every time   . Fluoxetine Hcl     REACTION: Intolerant  . Geodon [Ziprasidone Hcl] Other (See Comments)    shakes  . Onglyza [Saxagliptin] Nausea Only  . Paroxetine     REACTION: Intolerance    Past Medical History  Diagnosis Date  . Allergy   . Hyperlipidemia   . Thyroid disease   . Pain     Past Surgical History  Procedure Laterality Date  . Cholecystectomy    . Abdominal hysterectomy    . Lipoma removal      RT shoulder  . Pilonydal cyst  1994  . Cesarean section      History   Social History  . Marital Status: Single    Spouse Name: N/A    Number of Children: N/A  . Years of Education: N/A   Occupational History  . Not on file.   Social History Main Topics  . Smoking status: Current Every Day Smoker -- 2.50 packs/day  . Smokeless tobacco: Not on file  . Alcohol Use: Not on file  . Drug Use: No  . Sexual Activity: Not on file     Comment: s with mother currentlynemployed, on  disabilty, divorced, liver   Other Topics Concern  . Not on file   Social History Narrative  . No narrative on file    Family History  Problem Relation Age of Onset  . Diabetes Brother   . Depression Brother     bipolar  . Cancer Brother     throat  . Diabetes      grandmother  . Depression Mother   . Hyperlipidemia Mother   . Hypertension Mother   . Hypertension Father     Outpatient Encounter Prescriptions as of 12/01/2013  Medication Sig  . ALPRAZolam (XANAX) 1 MG tablet TAKE 1 TABLET THREE TIMES A DAY  . AMBULATORY NON FORMULARY MEDICATION Test strips and lancets for the Accuchek Avivva glucometer.  . AMBULATORY NON FORMULARY MEDICATION Glucometer of patient choice.  Diagnosis: Diabetes  Patient testing 2 times a day  . AMBULATORY NON FORMULARY MEDICATION Medication Name: needles and syringes for b12 injection Dx pernicious anemia  . Armodafinil 50 MG tablet Take 100 mg by mouth daily.  . brimonidine-timolol (COMBIGAN) 0.2-0.5 % ophthalmic solution Place 2 drops into both eyes daily.  Marland Kitchen  CRESTOR 20 MG tablet TAKE 1 TABLET EVERY DAY  . cyanocobalamin (,VITAMIN B-12,) 1000 MCG/ML injection Inject 1 mL (1,000 mcg total) into the muscle every 30 (thirty) days.  . DULoxetine (CYMBALTA) 30 MG capsule TAKE 3 CAPSULES BY MOUTH EVERY DAY  . fluticasone (FLONASE) 50 MCG/ACT nasal spray 2 SPRAYS EACH NOSTRIL EVERY DAY  . furosemide (LASIX) 20 MG tablet TAKE 1 TO 3 TABLETS BY MOUTH EVERY DAY AS NEEDED AS DIRECTED  . gabapentin (NEURONTIN) 600 MG tablet Take 600 mg by mouth daily.    Marland Kitchen HYDROcodone-acetaminophen (NORCO) 10-325 MG per tablet Take 1 tablet by mouth every 6 (six) hours as needed.  Marland Kitchen ibuprofen (ADVIL,MOTRIN) 800 MG tablet TAKE 1 TABLET BY MOUTH EVERY 8 HOURS AS NEEDED FOR PAIN  . ipratropium-albuterol (DUONEB) 0.5-2.5 (3) MG/3ML SOLN USE 1 VIAL EVERY 4 HOURS AS NEEDED  . levothyroxine (SYNTHROID, LEVOTHROID) 100 MCG tablet Take 1 tablet (100 mcg total) by mouth daily.  Marland Kitchen  losartan (COZAAR) 100 MG tablet TAKE 1 TABLET EVERY DAY  . meloxicam (MOBIC) 15 MG tablet TAKE 1 TABLET BY MOTH DAILY  . methocarbamol (ROBAXIN) 500 MG tablet TAKE 1 TABLET (500 MG TOTAL) BY MOUTH 3 (THREE) TIMES DAILY AS NEEDED.  Marland Kitchen mometasone-formoterol (DULERA) 200-5 MCG/ACT AERO Inhale 2 puffs into the lungs 2 (two) times daily.  . montelukast (SINGULAIR) 10 MG tablet Take 1 tablet (10 mg total) by mouth at bedtime.  Marland Kitchen nystatin (MYCOSTATIN) 100000 UNIT/ML suspension TAKE 1 TEASPOONFUL BY MOUTH 4 TIMES A DAY  . omeprazole (PRILOSEC) 20 MG capsule TAKE ONE CAPSULE TWICE A DAY  . potassium chloride (KLOR-CON) 10 MEQ CR tablet Take 10 mEq by mouth daily.    . prednisoLONE acetate (PRED FORTE) 1 % ophthalmic suspension 1 drop 4 (four) times daily.  Marland Kitchen PROAIR HFA 108 (90 BASE) MCG/ACT inhaler INHALE 2 PUFFS BY MOUTH EVERY 4 HOURS AS NEEDED  . promethazine (PHENERGAN) 25 MG tablet Take 1 tablet (25 mg total) by mouth every 8 (eight) hours as needed for nausea or vomiting.  . roflumilast (DALIRESP) 500 MCG TABS tablet Take 1 tablet (500 mcg total) by mouth daily.  . SEROQUEL XR 300 MG 24 hr tablet TAKE 1 TABLET (300 MG TOTAL) BY MOUTH AT BEDTIME.  . SUMAtriptan (IMITREX) 100 MG tablet Take 1 tablet (100 mg total) by mouth every 2 (two) hours as needed for migraine. May repeat in 2 hours if headache persists or recurs.  Marland Kitchen tiotropium (SPIRIVA HANDIHALER) 18 MCG inhalation capsule 1 PUFF EVERY DAY  . traMADol (ULTRAM) 50 MG tablet Take 1 tablet (50 mg total) by mouth 2 (two) times daily as needed for pain.  . Travoprost, BAK Free, (TRAVATAN) 0.004 % SOLN ophthalmic solution 1 drop at bedtime.  Marland Kitchen zolpidem (AMBIEN) 10 MG tablet TAKE 1 TABLET AT BEDTIME AS NEEDED FOR SLEEP  . [DISCONTINUED] HYDROcodone-acetaminophen (NORCO) 10-325 MG per tablet Take 1 tablet by mouth every 6 (six) hours as needed.  . [DISCONTINUED] promethazine (PHENERGAN) 25 MG tablet Take 1 tablet (25 mg total) by mouth every 6 (six) hours  as needed for nausea.  Marland Kitchen doxycycline (VIBRA-TABS) 100 MG tablet Take 1 tablet (100 mg total) by mouth 2 (two) times daily.  . theophylline (THEODUR) 100 MG 12 hr tablet Take 1 tablet (100 mg total) by mouth 2 (two) times daily.  . [DISCONTINUED] albuterol (PROVENTIL HFA;VENTOLIN HFA) 108 (90 BASE) MCG/ACT inhaler Inhale 2 puffs into the lungs every 4 (four) hours as needed for wheezing.  . [  DISCONTINUED] albuterol (PROVENTIL) (5 MG/ML) 0.5% nebulizer solution Take 5 mg by nebulization every 8 (eight) hours as needed.    . [DISCONTINUED] clotrimazole (LOTRIMIN) 1 % cream Apply topically 2 (two) times daily.  . [DISCONTINUED] levocetirizine (XYZAL) 5 MG tablet Take 5 mg by mouth every evening.    . [DISCONTINUED] predniSONE (DELTASONE) 20 MG tablet 2 tabs po QD x 5 days, then 1 po QD x 5 days  . [DISCONTINUED] sulfamethoxazole-trimethoprim (BACTRIM DS) 800-160 MG per tablet Take 1 tablet by mouth 2 (two) times daily.          Objective:   Physical Exam  Constitutional: She is oriented to person, place, and time. She appears well-developed and well-nourished.  HENT:  Head: Normocephalic and atraumatic.  Right Ear: External ear normal.  Left Ear: External ear normal.  Nose: Nose normal.  Mouth/Throat: Oropharynx is clear and moist.  TMs and canals are clear.   Eyes: Conjunctivae and EOM are normal. Pupils are equal, round, and reactive to light.  Neck: Neck supple. No thyromegaly present.  Cardiovascular: Normal rate, regular rhythm and normal heart sounds.   Pulmonary/Chest: Effort normal and breath sounds normal. She has no wheezes.  Lymphadenopathy:    She has no cervical adenopathy.  Neurological: She is alert and oriented to person, place, and time.  Skin: Skin is warm and dry.  Psychiatric: She has a normal mood and affect.          Assessment & Plan:  COPD-says she has noticed a little wheezing and change in sputum I will go ahead and give her an antibiotic to take if she  feels she's getting worse or not improving. Call if becoming more short of breath or symptomatic. Sent over prescription for doxycycline to fill if needed. I also gave her a sample of Breo ellipta today to try instead of her [facility] and see if she likes it better. She admits she often misses her morning dose at a later. So she may do better on something that's once a day.  GERD-she's feeling much better with the Phenergan. She would like a refill on the prescription today.

## 2013-12-01 NOTE — Patient Instructions (Addendum)
Okay to try [facility] in place until later. Use once a day instead of twice a day like the [facility]. If he liked this better please let me know and we can send a prescription to the pharmacy. Please fill the antibiotic if you feel like you're getting worse the sputum is looking more darkly become more short of breath or wheezing.

## 2013-12-01 NOTE — Telephone Encounter (Signed)
Sent fax for records.Krystal Burgess [place]

## 2013-12-06 ENCOUNTER — Other Ambulatory Visit: Payer: Self-pay | Admitting: Family Medicine

## 2013-12-19 ENCOUNTER — Ambulatory Visit (INDEPENDENT_AMBULATORY_CARE_PROVIDER_SITE_OTHER): Payer: Medicare Other | Admitting: Family Medicine

## 2013-12-19 ENCOUNTER — Encounter: Payer: Self-pay | Admitting: Family Medicine

## 2013-12-19 VITALS — BP 116/65 | HR 92 | Temp 97.4°F | Wt 250.0 lb

## 2013-12-19 DIAGNOSIS — J019 Acute sinusitis, unspecified: Secondary | ICD-10-CM

## 2013-12-19 DIAGNOSIS — K219 Gastro-esophageal reflux disease without esophagitis: Secondary | ICD-10-CM

## 2013-12-19 DIAGNOSIS — J441 Chronic obstructive pulmonary disease with (acute) exacerbation: Secondary | ICD-10-CM

## 2013-12-19 DIAGNOSIS — J029 Acute pharyngitis, unspecified: Secondary | ICD-10-CM

## 2013-12-19 DIAGNOSIS — K449 Diaphragmatic hernia without obstruction or gangrene: Secondary | ICD-10-CM

## 2013-12-19 DIAGNOSIS — R112 Nausea with vomiting, unspecified: Secondary | ICD-10-CM

## 2013-12-19 DIAGNOSIS — J209 Acute bronchitis, unspecified: Secondary | ICD-10-CM

## 2013-12-19 LAB — POCT RAPID STREP A (OFFICE): Rapid Strep A Screen: NEGATIVE

## 2013-12-19 MED ORDER — ONDANSETRON HCL 4 MG PO TABS: 4.0000 mg | ORAL_TABLET | Freq: Three times a day (TID) | ORAL | Status: DC | PRN

## 2013-12-19 MED ORDER — AZITHROMYCIN 250 MG PO TABS: ORAL_TABLET | ORAL | Status: DC

## 2013-12-19 MED ORDER — METHYLPREDNISOLONE SODIUM SUCC 125 MG IJ SOLR
125.0000 mg | Freq: Once | INTRAMUSCULAR | Status: AC
  Administered 2013-12-19: 125 mg via INTRAMUSCULAR

## 2013-12-19 MED ORDER — LEVOTHYROXINE SODIUM 100 MCG PO TABS: 100.0000 ug | ORAL_TABLET | Freq: Every day | ORAL | Status: DC

## 2013-12-19 MED ORDER — FLUTICASONE FUROATE-VILANTEROL 100-25 MCG/INH IN AEPB: 1.0000 | INHALATION_SPRAY | Freq: Every day | RESPIRATORY_TRACT | Status: DC

## 2013-12-19 MED ORDER — METHYLPREDNISOLONE ACETATE 80 MG/ML IJ SUSP
80.0000 mg | Freq: Once | INTRAMUSCULAR | Status: AC
  Administered 2013-12-19: 80 mg via INTRAMUSCULAR

## 2013-12-19 NOTE — Progress Notes (Signed)
Subjective:    Patient ID: Krystal Burgess, female    DOB: 08-31-1961, 52 y.o.   MRN: [id number]  HPI Nasal and chest congestion and ST x 4-5 days.   No fevers, chills, sweats.Amanda Cockayne sputum and nasal d/c.  Daughter has pos strep and she has been drinking after her daughter.   Not painful to eat or swallow.  She is wearing her  Oxygen. Her oxygen dropped to 89% off it.  Not on any cold medication.  Using albuterol every 2 hours.  No GI sxs.    Severe GERD - she is still having significant indigestion and reflux. She has a hiatal hernia and continues to smoke. She also has a very poor diet. She says every time she eats now she will belch continuously and sometimes gets so nauseated that she vomits. She is Re: taking a PPI. She uses Phenergan frequently and would like a refill on it today. Her reflux symptoms were initially exacerbated when she was diagnosed with glaucoma. Her pressures are now down but she still having significant GI problems. We had initially refer her back in February for some problems with dysphasia. She was unable to make appointment because she ended up in the hospital with her asthma/COPD. Unfortunately she has never rescheduled that. She prefers to be seen at [facility].   Review of Systems  BP 116/65  Pulse 92  Temp(Src) 97.4 F (36.3 C)  Wt 250 lb (113.399 kg)  SpO2 93%    Allergies  Allergen Reactions  . Aripiprazole     REACTION: muscle jerks  . Augmentin [Amoxicillin-Pot Clavulanate] Other (See Comments)    Gets yeast infection every time   . Fluoxetine Hcl     REACTION: Intolerant  . Geodon [Ziprasidone Hcl] Other (See Comments)    shakes  . Onglyza [Saxagliptin] Nausea Only  . Paroxetine     REACTION: Intolerance    Past Medical History  Diagnosis Date  . Allergy   . Hyperlipidemia   . Thyroid disease   . Pain     Past Surgical History  Procedure Laterality Date  . Cholecystectomy    . Abdominal hysterectomy    . Lipoma removal     RT shoulder  . Pilonydal cyst  1994  . Cesarean section      History   Social History  . Marital Status: Single    Spouse Name: N/A    Number of Children: N/A  . Years of Education: N/A   Occupational History  . Not on file.   Social History Main Topics  . Smoking status: Current Every Day Smoker -- 2.50 packs/day  . Smokeless tobacco: Not on file  . Alcohol Use: Not on file  . Drug Use: No  . Sexual Activity: Not on file     Comment: s with mother currentlynemployed, on disabilty, divorced, liver   Other Topics Concern  . Not on file   Social History Narrative  . No narrative on file    Family History  Problem Relation Age of Onset  . Diabetes Brother   . Depression Brother     bipolar  . Cancer Brother     throat  . Diabetes      grandmother  . Depression Mother   . Hyperlipidemia Mother   . Hypertension Mother   . Hypertension Father     Outpatient Encounter Prescriptions as of 12/19/2013  Medication Sig  . ALPRAZolam (XANAX) 1 MG tablet TAKE 1 TABLET THREE TIMES  A DAY  . AMBULATORY NON FORMULARY MEDICATION Test strips and lancets for the Accuchek Avivva glucometer.  . AMBULATORY NON FORMULARY MEDICATION Glucometer of patient choice.  Diagnosis: Diabetes  Patient testing 2 times a day  . AMBULATORY NON FORMULARY MEDICATION Medication Name: needles and syringes for b12 injection Dx pernicious anemia  . Armodafinil 50 MG tablet Take 100 mg by mouth daily.  . brimonidine-timolol (COMBIGAN) 0.2-0.5 % ophthalmic solution Place 2 drops into both eyes daily.  . CRESTOR 20 MG tablet TAKE 1 TABLET EVERY DAY  . cyanocobalamin (,VITAMIN B-12,) 1000 MCG/ML injection Inject 1 mL (1,000 mcg total) into the muscle every 30 (thirty) days.  . DULoxetine (CYMBALTA) 30 MG capsule TAKE 3 CAPSULES BY MOUTH EVERY DAY  . fluticasone (FLONASE) 50 MCG/ACT nasal spray 2 SPRAYS EACH NOSTRIL EVERY DAY  . furosemide (LASIX) 20 MG tablet TAKE 1 TO 3 TABLETS BY MOUTH EVERY DAY AS  NEEDED AS DIRECTED  . gabapentin (NEURONTIN) 600 MG tablet Take 600 mg by mouth daily.    Marland Kitchen HYDROcodone-acetaminophen (NORCO) 10-325 MG per tablet Take 1 tablet by mouth every 6 (six) hours as needed.  Marland Kitchen ibuprofen (ADVIL,MOTRIN) 800 MG tablet TAKE 1 TABLET BY MOUTH EVERY 8 HOURS AS NEEDED FOR PAIN  . ipratropium-albuterol (DUONEB) 0.5-2.5 (3) MG/3ML SOLN USE 1 VIAL EVERY 4 HOURS AS NEEDED  . levothyroxine (SYNTHROID, LEVOTHROID) 100 MCG tablet Take 1 tablet (100 mcg total) by mouth daily.  Marland Kitchen losartan (COZAAR) 100 MG tablet TAKE 1 TABLET EVERY DAY  . meloxicam (MOBIC) 15 MG tablet TAKE 1 TABLET BY MOTH DAILY  . methocarbamol (ROBAXIN) 500 MG tablet TAKE 1 TABLET (500 MG TOTAL) BY MOUTH 3 (THREE) TIMES DAILY AS NEEDED.  Marland Kitchen mometasone-formoterol (DULERA) 200-5 MCG/ACT AERO Inhale 2 puffs into the lungs 2 (two) times daily.  . montelukast (SINGULAIR) 10 MG tablet Take 1 tablet (10 mg total) by mouth at bedtime.  Marland Kitchen nystatin (MYCOSTATIN) 100000 UNIT/ML suspension TAKE 1 TEASPOONFUL BY MOUTH 4 TIMES A DAY  . omeprazole (PRILOSEC) 20 MG capsule TAKE ONE CAPSULE TWICE A DAY  . potassium chloride (KLOR-CON) 10 MEQ CR tablet Take 10 mEq by mouth daily.    . prednisoLONE acetate (PRED FORTE) 1 % ophthalmic suspension 1 drop 4 (four) times daily.  Marland Kitchen PROAIR HFA 108 (90 BASE) MCG/ACT inhaler INHALE 2 PUFFS BY MOUTH EVERY 4 HOURS AS NEEDED  . promethazine (PHENERGAN) 25 MG tablet Take 1 tablet (25 mg total) by mouth every 8 (eight) hours as needed for nausea or vomiting.  . roflumilast (DALIRESP) 500 MCG TABS tablet Take 1 tablet (500 mcg total) by mouth daily.  . SEROQUEL XR 300 MG 24 hr tablet TAKE 1 TABLET (300 MG TOTAL) BY MOUTH AT BEDTIME.  . SUMAtriptan (IMITREX) 100 MG tablet Take 1 tablet (100 mg total) by mouth every 2 (two) hours as needed for migraine. May repeat in 2 hours if headache persists or recurs.  . theophylline (THEODUR) 100 MG 12 hr tablet Take 1 tablet (100 mg total) by mouth 2 (two) times  daily.  Marland Kitchen tiotropium (SPIRIVA HANDIHALER) 18 MCG inhalation capsule 1 PUFF EVERY DAY  . traMADol (ULTRAM) 50 MG tablet Take 1 tablet (50 mg total) by mouth 2 (two) times daily as needed for pain.  . Travoprost, BAK Free, (TRAVATAN) 0.004 % SOLN ophthalmic solution 1 drop at bedtime.  Marland Kitchen zolpidem (AMBIEN) 10 MG tablet TAKE 1 TABLET AT BEDTIME AS NEEDED SLEEP  . [DISCONTINUED] doxycycline (VIBRA-TABS) 100 MG tablet Take  1 tablet (100 mg total) by mouth 2 (two) times daily.  . [DISCONTINUED] levothyroxine (SYNTHROID, LEVOTHROID) 100 MCG tablet Take 1 tablet (100 mcg total) by mouth daily.  Marland Kitchen azithromycin (ZITHROMAX) 250 MG tablet 2 tabs Day 1, then one a day x 4 days.  . Fluticasone Furoate-Vilanterol 100-25 MCG/INH AEPB Inhale 1 puff into the lungs daily.  . ondansetron (ZOFRAN) 4 MG tablet Take 1 tablet (4 mg total) by mouth every 8 (eight) hours as needed for nausea or vomiting.  . [EXPIRED] methylPREDNISolone acetate (DEPO-MEDROL) injection 80 mg   . [EXPIRED] methylPREDNISolone sodium succinate (SOLU-MEDROL) 125 mg/2 mL injection 125 mg           Objective:   Physical Exam  Constitutional: She is oriented to person, place, and time. She appears well-developed and well-nourished.  HENT:  Head: Normocephalic and atraumatic.  Right Ear: External ear normal.  Left Ear: External ear normal.  Nose: Nose normal.  Mouth/Throat: Oropharynx is clear and moist.  TMs and canals are clear.   Eyes: Conjunctivae and EOM are normal. Pupils are equal, round, and reactive to light.  Neck: Neck supple. No thyromegaly present.  Cardiovascular: Normal rate, regular rhythm and normal heart sounds.   Pulmonary/Chest: Effort normal. She has wheezes.  Diffuse expiratory wheezing.   Lymphadenopathy:    She has no cervical adenopathy.  Neurological: She is alert and oriented to person, place, and time.  Skin: Skin is warm and dry.  Psychiatric: She has a normal mood and affect.          Assessment  & Plan:  Acute bronchitis/sinusitis w/ COPD exacerbation. Will give solumedrol 125mg  and depomedrol 80mg  IM.  Don't want her to take oral steroids since having severe GERD sxs.  - neg strep test.  Rest, fluids. Use albuterol every 2-4 hours as needed. Call suddenly getting worse or more short of breath. Pulse ox at 93% today.  Severe GERD - will refer to GI.  Prefers to [facility] to [facility].  She is already on PPI, made diet changes, takes phenergan frequently. Recommend further evaluation.  It's possible she could be a candidate for surgical intervention if needed.  Pharyngitis - neg for step.

## 2013-12-19 NOTE — Patient Instructions (Signed)
Call if not better by end of the week. Continue to use your albuterol every 2-4 hours as needed.  Continue to wear your oxygen.  Steroid shot should last 1 week.

## 2013-12-26 ENCOUNTER — Other Ambulatory Visit: Payer: Self-pay | Admitting: Family Medicine

## 2013-12-29 ENCOUNTER — Other Ambulatory Visit: Payer: Self-pay | Admitting: *Deleted

## 2013-12-29 MED ORDER — HYDROCODONE-ACETAMINOPHEN 10-325 MG PO TABS: 1.0000 | ORAL_TABLET | Freq: Four times a day (QID) | ORAL | Status: DC | PRN

## 2013-12-29 MED ORDER — IPRATROPIUM-ALBUTEROL 0.5-2.5 (3) MG/3ML IN SOLN: RESPIRATORY_TRACT | Status: DC

## 2013-12-29 NOTE — Telephone Encounter (Signed)
Refill for Vicodin.Loralee Pacas [place]

## 2014-01-02 ENCOUNTER — Encounter: Payer: Self-pay | Admitting: Family Medicine

## 2014-01-02 ENCOUNTER — Ambulatory Visit (INDEPENDENT_AMBULATORY_CARE_PROVIDER_SITE_OTHER): Payer: Medicare HMO | Admitting: Family Medicine

## 2014-01-02 VITALS — BP 134/80 | HR 102 | Temp 98.2°F | Wt 250.0 lb

## 2014-01-02 DIAGNOSIS — J441 Chronic obstructive pulmonary disease with (acute) exacerbation: Secondary | ICD-10-CM

## 2014-01-02 DIAGNOSIS — J45901 Unspecified asthma with (acute) exacerbation: Secondary | ICD-10-CM

## 2014-01-02 DIAGNOSIS — J209 Acute bronchitis, unspecified: Secondary | ICD-10-CM

## 2014-01-02 DIAGNOSIS — J45909 Unspecified asthma, uncomplicated: Secondary | ICD-10-CM

## 2014-01-02 DIAGNOSIS — J4551 Severe persistent asthma with (acute) exacerbation: Secondary | ICD-10-CM

## 2014-01-02 MED ORDER — IPRATROPIUM-ALBUTEROL 0.5-2.5 (3) MG/3ML IN SOLN: RESPIRATORY_TRACT | Status: DC

## 2014-01-02 MED ORDER — METHYLPREDNISOLONE ACETATE 80 MG/ML IJ SUSP
80.0000 mg | Freq: Once | INTRAMUSCULAR | Status: AC
  Administered 2014-01-02: 80 mg via INTRAMUSCULAR

## 2014-01-02 MED ORDER — ALBUTEROL SULFATE (2.5 MG/3ML) 0.083% IN NEBU
2.5000 mg | INHALATION_SOLUTION | Freq: Once | RESPIRATORY_TRACT | Status: AC
  Administered 2014-01-02: 2.5 mg via RESPIRATORY_TRACT

## 2014-01-02 MED ORDER — AMOXICILLIN-POT CLAVULANATE 875-125 MG PO TABS: 1.0000 | ORAL_TABLET | Freq: Two times a day (BID) | ORAL | Status: DC

## 2014-01-02 NOTE — Progress Notes (Signed)
   Subjective:    Patient ID: Krystal Burgess, female    DOB: 1961/05/17, 53 y.o.   MRN: [id number]  HPI Started to get better from last acute bronchitis.  Given shot for steroids when last here. Started feeling bad  Again last Friday, 4 days ago. Some fever.  Mild ST. No ear pain.  Cough is productive.  Thick white mucous this time.  Some green nasal d/c.  Using mucinex for for sinus sxs. Has done to albuterol tx todays.  She 91% on 2 liters oxygen. Says started feeling some better last week adn then got worse again on Friday. Mother and daughter have been sick as well with a respiratory illness.   Review of Systems     Objective:   Physical Exam  Constitutional: She is oriented to person, place, and time. She appears well-developed and well-nourished.  HENT:  Head: Normocephalic and atraumatic.  Right Ear: External ear normal.  Left Ear: External ear normal.  Nose: Nose normal.  Mouth/Throat: Oropharynx is clear and moist.  TMs and canals are clear.   Eyes: Conjunctivae and EOM are normal. Pupils are equal, round, and reactive to light.  Neck: Neck supple. No thyromegaly present.  Cardiovascular: Normal rate, regular rhythm and normal heart sounds.   Pulmonary/Chest: Effort normal. She has wheezes.  Expiratory wheezing. With poor air movement.  Lymphadenopathy:    She has no cervical adenopathy.  Neurological: She is alert and oriented to person, place, and time.  Skin: Skin is warm and dry.  Psychiatric: She has a normal mood and affect. Her behavior is normal.          Assessment & Plan:  COPD exacerbation/bronchitis/sinusitis-suspect this may be viral because she has 2 other people in the home have been sick. But with her exacerbation of her COPD and symptoms I think it warrants treatment. We'll go ahead and treat with Augmentin in addition to prednisone. Given Depo-Medrol 80 mg IM. I still want to avoid oral steroids if at all possible because of her recent issues with  gastritis and nausea. If she's not improving by the end of the week and please let me know. Continue neb treatments every 4 hours as needed. New   We did give her a nebulized albuterol treatment here in the office and she felt much better afterwards. Lung sounds still consistent with bronchitis.  This is her third illness in 6 weeks. Discussed with her that if she's not improving after this illness then I would like to refer her to a pulmonologist for further evaluation. She is to see Dr. Enid Skeens in [place].

## 2014-01-04 ENCOUNTER — Other Ambulatory Visit: Payer: Self-pay | Admitting: Family Medicine

## 2014-01-06 ENCOUNTER — Ambulatory Visit: Payer: Medicare Other | Admitting: Family Medicine

## 2014-01-11 ENCOUNTER — Other Ambulatory Visit: Payer: Self-pay | Admitting: *Deleted

## 2014-01-11 MED ORDER — IPRATROPIUM-ALBUTEROL 0.5-2.5 (3) MG/3ML IN SOLN: RESPIRATORY_TRACT | Status: DC

## 2014-01-30 ENCOUNTER — Ambulatory Visit: Payer: Medicare Other | Admitting: Family Medicine

## 2014-01-30 ENCOUNTER — Other Ambulatory Visit: Payer: Self-pay | Admitting: Family Medicine

## 2014-01-31 ENCOUNTER — Ambulatory Visit (INDEPENDENT_AMBULATORY_CARE_PROVIDER_SITE_OTHER): Payer: Medicare HMO | Admitting: Physician Assistant

## 2014-01-31 ENCOUNTER — Encounter: Payer: Self-pay | Admitting: Physician Assistant

## 2014-01-31 VITALS — BP 121/74 | HR 103 | Temp 97.9°F | Wt 243.0 lb

## 2014-01-31 DIAGNOSIS — J069 Acute upper respiratory infection, unspecified: Secondary | ICD-10-CM

## 2014-01-31 DIAGNOSIS — M48061 Spinal stenosis, lumbar region without neurogenic claudication: Secondary | ICD-10-CM

## 2014-01-31 DIAGNOSIS — J209 Acute bronchitis, unspecified: Secondary | ICD-10-CM

## 2014-01-31 DIAGNOSIS — J441 Chronic obstructive pulmonary disease with (acute) exacerbation: Secondary | ICD-10-CM

## 2014-01-31 MED ORDER — DOXYCYCLINE HYCLATE 100 MG PO TABS
100.0000 mg | ORAL_TABLET | Freq: Two times a day (BID) | ORAL | Status: DC
Start: 2014-01-31 — End: 2014-03-03

## 2014-01-31 MED ORDER — PREDNISONE 50 MG PO TABS: ORAL_TABLET | ORAL | Status: DC

## 2014-01-31 MED ORDER — HYDROCODONE-ACETAMINOPHEN 10-325 MG PO TABS: 1.0000 | ORAL_TABLET | Freq: Four times a day (QID) | ORAL | Status: DC | PRN

## 2014-01-31 MED ORDER — METHYLPREDNISOLONE ACETATE 80 MG/ML IJ SUSP
80.0000 mg | Freq: Once | INTRAMUSCULAR | Status: AC
  Administered 2014-01-31: 80 mg via INTRAMUSCULAR

## 2014-01-31 NOTE — Patient Instructions (Signed)
Prednisone sent to pharmacy.   Mucinex 600mg  twice a day get OTC liquid.

## 2014-01-31 NOTE — Progress Notes (Signed)
   Subjective:    Patient ID: Krystal Burgess, female    DOB: [birth year], 53 y.o.   MRN: [id number]  HPI Pt is a 53 yo female who presents to the clinic with wheezing, sinus pressure, difficulty breathing. She was seen within the month and felt some better but then started to feel worse. Still having problems breathing 93 percent of 2L of O2 today. Using albuterol every 4 hours. Feels very weak. Denies any fever. NO ST or ear pain. Cough is productive.   Pt needs refill on narcotic for pain control.      Review of Systems     Objective:   Physical Exam  Constitutional: She is oriented to person, place, and time. She appears well-developed and well-nourished.  HENT:  Head: Normocephalic and atraumatic.  Right Ear: External ear normal.  Left Ear: External ear normal.  Mouth/Throat: Oropharynx is clear and moist.  Eyes: Conjunctivae are normal. Right eye exhibits no discharge. Left eye exhibits no discharge.  Neck: Normal range of motion. Neck supple.  Cardiovascular: Regular rhythm and normal heart sounds.   Tachycardia at 103.   Pulmonary/Chest:  2: of o2 at 93 percent. Seems winded but able to complete sentences. Wheezing and rhonchi throughout both lungs. No rales.   Lymphadenopathy:    She has no cervical adenopathy.  Neurological: She is alert and oriented to person, place, and time.  Skin: Skin is dry.  Psychiatric: She has a normal mood and affect. Her behavior is normal.          Assessment & Plan:  COPD exacerbation/Acute bronchitis/Wheezing/coughing- Depo medrol 80mg  given in office today. Treated with prednisone for 5 days. Pt did not get last time due to GI symptoms. Per pt resolved today. Doxycyline was also given. Continue on O2 as needed. Use abluterol every 4 hours until symptoms start to improve. Follow up Friday if not improving.   Spinal stenosis of lumbar region- refilled hydrocodone per guidelines.pt on pain clinic contract with [name].

## 2014-02-03 ENCOUNTER — Other Ambulatory Visit: Payer: Self-pay | Admitting: Family Medicine

## 2014-02-07 ENCOUNTER — Other Ambulatory Visit: Payer: Self-pay | Admitting: Family Medicine

## 2014-02-08 ENCOUNTER — Telehealth: Payer: Self-pay | Admitting: Family Medicine

## 2014-02-08 MED ORDER — OMEPRAZOLE 20 MG PO CPDR: DELAYED_RELEASE_CAPSULE | ORAL | Status: DC

## 2014-02-08 NOTE — Telephone Encounter (Signed)
rx sent.Krystal Burgess  

## 2014-02-08 NOTE — Telephone Encounter (Signed)
Pt called for refill on Omeprazole.

## 2014-02-22 ENCOUNTER — Other Ambulatory Visit: Payer: Self-pay | Admitting: Family Medicine

## 2014-02-27 ENCOUNTER — Other Ambulatory Visit: Payer: Self-pay | Admitting: *Deleted

## 2014-02-27 MED ORDER — HYDROCODONE-ACETAMINOPHEN 10-325 MG PO TABS: 1.0000 | ORAL_TABLET | Freq: Four times a day (QID) | ORAL | Status: DC | PRN

## 2014-02-27 NOTE — Telephone Encounter (Signed)
Patient calls for refill on hydrocodone.  Rx printed and pt notified to pick up

## 2014-03-01 ENCOUNTER — Ambulatory Visit: Payer: Medicare HMO | Admitting: Physician Assistant

## 2014-03-03 ENCOUNTER — Other Ambulatory Visit: Payer: Self-pay | Admitting: Family Medicine

## 2014-03-03 ENCOUNTER — Ambulatory Visit (INDEPENDENT_AMBULATORY_CARE_PROVIDER_SITE_OTHER): Payer: Medicare HMO | Admitting: Physician Assistant

## 2014-03-03 ENCOUNTER — Encounter: Payer: Self-pay | Admitting: Physician Assistant

## 2014-03-03 ENCOUNTER — Other Ambulatory Visit: Payer: Self-pay | Admitting: Physician Assistant

## 2014-03-03 VITALS — BP 137/79 | HR 84 | Temp 97.9°F | Wt 252.0 lb

## 2014-03-03 DIAGNOSIS — B9689 Other specified bacterial agents as the cause of diseases classified elsewhere: Secondary | ICD-10-CM

## 2014-03-03 DIAGNOSIS — J329 Chronic sinusitis, unspecified: Secondary | ICD-10-CM

## 2014-03-03 DIAGNOSIS — J441 Chronic obstructive pulmonary disease with (acute) exacerbation: Secondary | ICD-10-CM

## 2014-03-03 DIAGNOSIS — A499 Bacterial infection, unspecified: Secondary | ICD-10-CM

## 2014-03-03 MED ORDER — METHYLPREDNISOLONE ACETATE 80 MG/ML IJ SUSP
80.0000 mg | Freq: Once | INTRAMUSCULAR | Status: AC
  Administered 2014-03-03: 80 mg via INTRAMUSCULAR

## 2014-03-03 MED ORDER — DOXYCYCLINE HYCLATE 100 MG PO TABS: 100.0000 mg | ORAL_TABLET | Freq: Two times a day (BID) | ORAL | Status: DC

## 2014-03-03 MED ORDER — PREDNISONE 20 MG PO TABS: ORAL_TABLET | ORAL | Status: DC

## 2014-03-03 NOTE — Progress Notes (Signed)
   Subjective:    Patient ID: Krystal Burgess, female    DOB: 12-09-1961, 53 y.o.   MRN: [id number]  HPI Pt is a 53 yo female who presents to the clinic with sinus pressure, congestion, cough and worsening SOB. Pt has COPD and on 2L O2 constantly. She has a hx of needed multiple abx to get over sickness. Wheezing and SOB continues to get worse. She is having much more production with cough. Started with sinus pressure days before wheezing and SOB occurred. No fever, chills, nausea or vomiting. Not tried anything to make better.    Review of Systems     Objective:   Physical Exam  Constitutional: She is oriented to person, place, and time. She appears well-developed and well-nourished.  Obese.  HENT:  Head: Normocephalic and atraumatic.  Right Ear: External ear normal.  Left Ear: External ear normal.  Nose: Nose normal.  Mouth/Throat: Oropharynx is clear and moist.  Eyes: Conjunctivae are normal.  Neck: Normal range of motion. Neck supple.  Cardiovascular: Normal rate, regular rhythm and normal heart sounds.   Pulmonary/Chest:  Pulse ox 96% on 2L of O2. Mild wheezing at base of both lungs, decreased air movement.   Lymphadenopathy:    She has no cervical adenopathy.  Neurological: She is alert and oriented to person, place, and time.  Skin: Skin is dry.  Psychiatric: She has a normal mood and affect. Her behavior is normal.          Assessment & Plan:  Copd exacerbation/sinusitis- Doxy for 10 days, given prednisone burst, continue albuterol every 2-4 hours as needed, continue O2. Call if not improving.

## 2014-03-03 NOTE — Patient Instructions (Signed)
Start prednisone and doxycycline. Continue to use albuterol every 2-4 hours as needed. Follow up as needed.

## 2014-03-08 ENCOUNTER — Other Ambulatory Visit: Payer: Self-pay | Admitting: Family Medicine

## 2014-03-10 ENCOUNTER — Other Ambulatory Visit: Payer: Self-pay | Admitting: Family Medicine

## 2014-03-14 ENCOUNTER — Other Ambulatory Visit: Payer: Self-pay | Admitting: Family Medicine

## 2014-03-22 ENCOUNTER — Other Ambulatory Visit: Payer: Self-pay | Admitting: Physician Assistant

## 2014-03-23 ENCOUNTER — Other Ambulatory Visit: Payer: Self-pay | Admitting: Physician Assistant

## 2014-03-26 ENCOUNTER — Other Ambulatory Visit: Payer: Self-pay | Admitting: Family Medicine

## 2014-03-30 ENCOUNTER — Other Ambulatory Visit: Payer: Self-pay | Admitting: *Deleted

## 2014-03-30 ENCOUNTER — Ambulatory Visit: Payer: Medicare HMO | Admitting: Family Medicine

## 2014-03-30 ENCOUNTER — Other Ambulatory Visit: Payer: Self-pay | Admitting: Family Medicine

## 2014-03-30 MED ORDER — HYDROCODONE-ACETAMINOPHEN 10-325 MG PO TABS: 1.0000 | ORAL_TABLET | Freq: Four times a day (QID) | ORAL | Status: DC | PRN

## 2014-04-04 ENCOUNTER — Encounter: Payer: Self-pay | Admitting: Family Medicine

## 2014-04-04 ENCOUNTER — Ambulatory Visit (INDEPENDENT_AMBULATORY_CARE_PROVIDER_SITE_OTHER): Payer: Medicare HMO | Admitting: Family Medicine

## 2014-04-04 VITALS — BP 161/99 | HR 91 | Wt 253.0 lb

## 2014-04-04 DIAGNOSIS — M545 Low back pain, unspecified: Secondary | ICD-10-CM

## 2014-04-04 DIAGNOSIS — J441 Chronic obstructive pulmonary disease with (acute) exacerbation: Secondary | ICD-10-CM

## 2014-04-04 DIAGNOSIS — F319 Bipolar disorder, unspecified: Secondary | ICD-10-CM

## 2014-04-04 DIAGNOSIS — J019 Acute sinusitis, unspecified: Secondary | ICD-10-CM

## 2014-04-04 DIAGNOSIS — G8929 Other chronic pain: Secondary | ICD-10-CM

## 2014-04-04 MED ORDER — QUETIAPINE FUMARATE ER 400 MG PO TB24: ORAL_TABLET | ORAL | Status: DC

## 2014-04-04 MED ORDER — PREDNISONE 20 MG PO TABS: 40.0000 mg | ORAL_TABLET | Freq: Every day | ORAL | Status: DC

## 2014-04-04 MED ORDER — LEVOFLOXACIN 500 MG PO TABS: 500.0000 mg | ORAL_TABLET | Freq: Every day | ORAL | Status: AC

## 2014-04-04 MED ORDER — METHYLPREDNISOLONE SODIUM SUCC 125 MG IJ SOLR
125.0000 mg | Freq: Once | INTRAMUSCULAR | Status: AC
  Administered 2014-04-04: 125 mg via INTRAMUSCULAR

## 2014-04-04 NOTE — Progress Notes (Signed)
Called and gave VO for refill for xanax with 1 refill pt stated pharmacy never received order.Krystal Burgess

## 2014-04-04 NOTE — Progress Notes (Signed)
   Subjective:    Patient ID: Krystal Burgess, female    DOB: [birth year], 53 y.o.   MRN: [id number]  HPI Sinus sxs started last night with congestin and green mucous. Started moving into her chest last night.  She has severe Asthma/COPD.  No fever, chills, or sweats.  Last seen 4 weeks ago for similar sxs. Has been more SOB.  Today has been doing breathing tx every 3 hours today.  Sinuses are sore on the right. Right ear is ok.      Back Pain - Back has been catching on her right side.  Says will need an MRI before goes back to see Dr. Manson Passey. Pain is radiaating into the right leg.  Will feel a sudden pop that is very paiful. She feels like something is shifting out of place. On chronic pain medications.   Hx of bipolar disorder-she thinks that her medication may need to be adjusted little bit. She's been very irritable lately and snapping in her mother. No significant changes with sleep and depression.  Review of Systems     Objective:   Physical Exam  Constitutional: She is oriented to person, place, and time. She appears well-developed and well-nourished.  HENT:  Head: Normocephalic and atraumatic.  Right Ear: External ear normal.  Left Ear: External ear normal.  Nose: Nose normal.  Mouth/Throat: Oropharynx is clear and moist.  TMs and canals are clear.   Eyes: Conjunctivae and EOM are normal. Pupils are equal, round, and reactive to light.  Neck: Neck supple. No thyromegaly present.  Cardiovascular: Normal rate, regular rhythm and normal heart sounds.   Pulmonary/Chest: Effort normal and breath sounds normal. She has no wheezes.  Lymphadenopathy:    She has no cervical adenopathy.  Neurological: She is alert and oriented to person, place, and time.  Skin: Skin is warm and dry.  Psychiatric: She has a normal mood and affect.          Assessment & Plan:  Acute sinusitis/COPD exacerbation- will tx with IM solumedrol.  Will tx with prednisone and levaquin. Call if not better in  one week.   Continue albuterol neb treatments every 24 hours as needed.  Lumbago, chronic low back pain-will schedule MRI for further evaluation.  Will call with results.    Bipolar D/O  - will increase Seroquel to 400 mg after she completes the Levaquin as there is a potential reaction with cervical and Levaquin for Q-T prolongation. I think a lot of her issues situational and not medication has not been as effective.

## 2014-04-05 ENCOUNTER — Other Ambulatory Visit: Payer: Self-pay | Admitting: Physician Assistant

## 2014-04-05 ENCOUNTER — Telehealth: Payer: Self-pay | Admitting: *Deleted

## 2014-04-05 NOTE — Telephone Encounter (Signed)
PA obtained for MRI Lumbar w/o contrast. Auth # [id number].   Meyer Cory, LPN

## 2014-04-06 ENCOUNTER — Other Ambulatory Visit: Payer: Self-pay | Admitting: Family Medicine

## 2014-04-10 ENCOUNTER — Other Ambulatory Visit: Payer: Self-pay | Admitting: Family Medicine

## 2014-04-18 ENCOUNTER — Other Ambulatory Visit: Payer: Self-pay | Admitting: Family Medicine

## 2014-04-27 ENCOUNTER — Other Ambulatory Visit: Payer: Self-pay | Admitting: Family Medicine

## 2014-04-27 ENCOUNTER — Other Ambulatory Visit: Payer: Self-pay | Admitting: *Deleted

## 2014-04-27 MED ORDER — HYDROCODONE-ACETAMINOPHEN 10-325 MG PO TABS: 1.0000 | ORAL_TABLET | Freq: Four times a day (QID) | ORAL | Status: DC | PRN

## 2014-04-27 NOTE — Telephone Encounter (Signed)
Pt informed.Krystal Burgess L [name]  

## 2014-05-08 ENCOUNTER — Other Ambulatory Visit: Payer: Self-pay | Admitting: Family Medicine

## 2014-05-09 ENCOUNTER — Other Ambulatory Visit: Payer: Self-pay | Admitting: Family Medicine

## 2014-05-12 ENCOUNTER — Other Ambulatory Visit: Payer: Self-pay | Admitting: *Deleted

## 2014-05-12 ENCOUNTER — Telehealth: Payer: Self-pay | Admitting: *Deleted

## 2014-05-12 MED ORDER — PREDNISONE 10 MG PO TABS: 10.0000 mg | ORAL_TABLET | Freq: Every day | ORAL | Status: DC

## 2014-05-16 ENCOUNTER — Other Ambulatory Visit: Payer: Self-pay | Admitting: Family Medicine

## 2014-05-16 ENCOUNTER — Ambulatory Visit: Payer: Medicare HMO | Admitting: Family Medicine

## 2014-05-16 DIAGNOSIS — Z0289 Encounter for other administrative examinations: Secondary | ICD-10-CM

## 2014-05-17 ENCOUNTER — Other Ambulatory Visit: Payer: Self-pay | Admitting: Family Medicine

## 2014-05-19 NOTE — Telephone Encounter (Signed)
Krystal Burgess.Deno Etienne

## 2014-05-30 ENCOUNTER — Other Ambulatory Visit: Payer: Self-pay

## 2014-05-30 MED ORDER — HYDROCODONE-ACETAMINOPHEN 10-325 MG PO TABS: 1.0000 | ORAL_TABLET | Freq: Four times a day (QID) | ORAL | Status: DC | PRN

## 2014-05-30 NOTE — Telephone Encounter (Signed)
Refilled medication

## 2014-05-31 ENCOUNTER — Ambulatory Visit: Payer: Medicare HMO | Admitting: Physician Assistant

## 2014-06-06 ENCOUNTER — Other Ambulatory Visit: Payer: Self-pay | Admitting: Family Medicine

## 2014-06-13 ENCOUNTER — Other Ambulatory Visit: Payer: Self-pay | Admitting: Family Medicine

## 2014-06-17 ENCOUNTER — Other Ambulatory Visit: Payer: Self-pay | Admitting: Physician Assistant

## 2014-06-23 ENCOUNTER — Other Ambulatory Visit: Payer: Self-pay | Admitting: Physician Assistant

## 2014-06-23 ENCOUNTER — Other Ambulatory Visit: Payer: Self-pay | Admitting: Family Medicine

## 2014-06-28 ENCOUNTER — Other Ambulatory Visit: Payer: Self-pay | Admitting: Family Medicine

## 2014-06-28 MED ORDER — HYDROCODONE-ACETAMINOPHEN 10-325 MG PO TABS: 1.0000 | ORAL_TABLET | Freq: Four times a day (QID) | ORAL | Status: DC | PRN

## 2014-07-06 ENCOUNTER — Other Ambulatory Visit: Payer: Self-pay | Admitting: Family Medicine

## 2014-07-16 ENCOUNTER — Other Ambulatory Visit: Payer: Self-pay | Admitting: Physician Assistant

## 2014-07-28 ENCOUNTER — Ambulatory Visit: Payer: Medicare HMO | Admitting: Physician Assistant

## 2014-07-31 ENCOUNTER — Encounter: Payer: Self-pay | Admitting: Physician Assistant

## 2014-07-31 ENCOUNTER — Ambulatory Visit (INDEPENDENT_AMBULATORY_CARE_PROVIDER_SITE_OTHER): Payer: Medicare HMO | Admitting: Physician Assistant

## 2014-07-31 VITALS — BP 139/91 | HR 89 | Temp 98.1°F | Wt 269.0 lb

## 2014-07-31 DIAGNOSIS — M48061 Spinal stenosis, lumbar region without neurogenic claudication: Secondary | ICD-10-CM

## 2014-07-31 DIAGNOSIS — J0100 Acute maxillary sinusitis, unspecified: Secondary | ICD-10-CM

## 2014-07-31 DIAGNOSIS — G4700 Insomnia, unspecified: Secondary | ICD-10-CM

## 2014-07-31 MED ORDER — DOXYCYCLINE HYCLATE 100 MG PO TABS: 100.0000 mg | ORAL_TABLET | Freq: Two times a day (BID) | ORAL | Status: DC

## 2014-07-31 MED ORDER — ZOLPIDEM TARTRATE 10 MG PO TABS: ORAL_TABLET | ORAL | Status: DC

## 2014-07-31 MED ORDER — PROMETHAZINE HCL 25 MG PO TABS: 25.0000 mg | ORAL_TABLET | Freq: Three times a day (TID) | ORAL | Status: DC | PRN

## 2014-07-31 MED ORDER — DULOXETINE HCL 30 MG PO CPEP: ORAL_CAPSULE | ORAL | Status: DC

## 2014-07-31 MED ORDER — ROFLUMILAST 500 MCG PO TABS: ORAL_TABLET | ORAL | Status: DC

## 2014-07-31 MED ORDER — FUROSEMIDE 20 MG PO TABS: ORAL_TABLET | ORAL | Status: DC

## 2014-07-31 MED ORDER — HYDROCODONE-ACETAMINOPHEN 10-325 MG PO TABS: 1.0000 | ORAL_TABLET | Freq: Four times a day (QID) | ORAL | Status: DC | PRN

## 2014-07-31 MED ORDER — NYSTATIN 100000 UNIT/ML MT SUSP: OROMUCOSAL | Status: DC

## 2014-07-31 MED ORDER — PREDNISONE 10 MG PO TABS: 10.0000 mg | ORAL_TABLET | Freq: Every day | ORAL | Status: DC

## 2014-08-01 ENCOUNTER — Other Ambulatory Visit: Payer: Self-pay | Admitting: Family Medicine

## 2014-08-01 NOTE — Progress Notes (Signed)
   Subjective:    Patient ID: Krystal Burgess, female    DOB: Jun 30, 1961, 53 y.o.   MRN: [id number]  HPI Pt presents to the clinic with 2-3 weeks of sinus pressure, ear pressure and now developing a cough. She has COPD and hx of multiple exacerbations. She has had the occasional wheeze but nothing like she usually has. [name] put her on prednisone 10mg  daily for maintence and has made a huge difference. Not tried anything OTC. No fever, chills, n/v/d.     Review of Systems  All other systems reviewed and are negative.      Objective:   Physical Exam  Constitutional: She is oriented to person, place, and time. She appears well-developed and well-nourished.  HENT:  Head: Normocephalic and atraumatic.  Right Ear: External ear normal.  Left Ear: External ear normal.  Nose: Nose normal.  Mouth/Throat: Oropharynx is clear and moist.  Bilateral maxillary tenderness to palpation.  TM clear bilaterally.   Eyes: Conjunctivae are normal. Right eye exhibits no discharge. Left eye exhibits no discharge.  Neck: Normal range of motion. Neck supple.  Cardiovascular: Normal rate, regular rhythm and normal heart sounds.   Pulmonary/Chest:  No wheezing heard. Coarse breath sounds. Pt is on 2L continuous oxygen and pulse ox at 95 percent.   Lymphadenopathy:    She has no cervical adenopathy.  Neurological: She is alert and oriented to person, place, and time.  Skin: Skin is dry.  Psychiatric: She has a normal mood and affect. Her behavior is normal.          Assessment & Plan:  Acute sinusitis- i do feel like daily prednisone is keeping her COPD symptoms at bay. Her lungs really sounded ok today. Current infection seems to be more her head than chest. Will treat with doxycycline for 10 days due to her allergies.. I did refill her daily prednisone but asked her to follow up with PCP for future management. Increase rescue inhaler while recovering from sinus infection. Follow up if worsening.     Spinal stenosis- time for her refill of norco. Printed today.   Insomnia- printed script for [employer]. Pt reports being controlled.

## 2014-08-05 ENCOUNTER — Other Ambulatory Visit: Payer: Self-pay | Admitting: Family Medicine

## 2014-08-05 ENCOUNTER — Other Ambulatory Visit: Payer: Self-pay | Admitting: Physician Assistant

## 2014-08-07 ENCOUNTER — Other Ambulatory Visit: Payer: Self-pay | Admitting: Family Medicine

## 2014-08-07 ENCOUNTER — Telehealth: Payer: Self-pay

## 2014-08-07 NOTE — Telephone Encounter (Signed)
Patient called and left a message on nurse line asking for a return call.   Returned Call: Left message asking patient to call back.  

## 2014-08-10 ENCOUNTER — Other Ambulatory Visit: Payer: Self-pay | Admitting: Family Medicine

## 2014-08-14 ENCOUNTER — Other Ambulatory Visit: Payer: Self-pay | Admitting: *Deleted

## 2014-08-16 ENCOUNTER — Other Ambulatory Visit: Payer: Self-pay | Admitting: Family Medicine

## 2014-08-17 ENCOUNTER — Telehealth: Payer: Self-pay

## 2014-08-17 NOTE — Telephone Encounter (Signed)
Ok to change to regular release. It would be 400mg  BID.

## 2014-08-17 NOTE — Telephone Encounter (Signed)
[facility] reports the Seroquel XR 400 mg tablets are not available for now and until an undetermined date. Her insurance will not allow her to have two 200 mg tablets. They do have the 400 mg tablets that is not the XR. Please advise.

## 2014-08-18 MED ORDER — QUETIAPINE FUMARATE 400 MG PO TABS: 400.0000 mg | ORAL_TABLET | Freq: Two times a day (BID) | ORAL | Status: DC

## 2014-08-18 NOTE — Telephone Encounter (Signed)
Patient's mom advised of the change in medication.

## 2014-08-21 ENCOUNTER — Other Ambulatory Visit: Payer: Self-pay | Admitting: Family Medicine

## 2014-08-28 ENCOUNTER — Other Ambulatory Visit: Payer: Self-pay | Admitting: Physician Assistant

## 2014-08-31 ENCOUNTER — Ambulatory Visit: Payer: Medicare HMO | Admitting: Family Medicine

## 2014-09-01 ENCOUNTER — Encounter: Payer: Self-pay | Admitting: Physician Assistant

## 2014-09-01 ENCOUNTER — Ambulatory Visit (INDEPENDENT_AMBULATORY_CARE_PROVIDER_SITE_OTHER): Payer: Medicare HMO | Admitting: Physician Assistant

## 2014-09-01 VITALS — BP 126/79 | HR 118

## 2014-09-01 DIAGNOSIS — J302 Other seasonal allergic rhinitis: Secondary | ICD-10-CM

## 2014-09-01 DIAGNOSIS — J441 Chronic obstructive pulmonary disease with (acute) exacerbation: Secondary | ICD-10-CM

## 2014-09-01 DIAGNOSIS — J309 Allergic rhinitis, unspecified: Secondary | ICD-10-CM

## 2014-09-01 MED ORDER — AZITHROMYCIN 250 MG PO TABS: ORAL_TABLET | ORAL | Status: DC

## 2014-09-01 MED ORDER — FEXOFENADINE HCL 180 MG PO TABS: 180.0000 mg | ORAL_TABLET | Freq: Every day | ORAL | Status: DC

## 2014-09-01 MED ORDER — PREDNISONE 20 MG PO TABS: ORAL_TABLET | ORAL | Status: DC

## 2014-09-01 MED ORDER — METHYLPREDNISOLONE SODIUM SUCC 125 MG IJ SOLR
125.0000 mg | Freq: Once | INTRAMUSCULAR | Status: AC
  Administered 2014-09-01: 125 mg via INTRAMUSCULAR

## 2014-09-01 MED ORDER — HYDROCODONE-ACETAMINOPHEN 10-325 MG PO TABS: 1.0000 | ORAL_TABLET | Freq: Four times a day (QID) | ORAL | Status: DC | PRN

## 2014-09-01 NOTE — Progress Notes (Signed)
   Subjective:    Patient ID: Krystal Burgess, female    DOB: June 18, 1961, 53 y.o.   MRN: [id number]  HPI Pt presents to the clinic on 2L O2 with COPD. She is having increased sOb, productive cough, congestion for last week.  No fever, chills. Very fatigued. Using inhalers. Daily smoker.    Review of Systems  All other systems reviewed and are negative.      Objective:   Physical Exam  Constitutional: She is oriented to person, place, and time. She appears well-developed and well-nourished.  HENT:  Head: Normocephalic and atraumatic.  Right Ear: External ear normal.  Left Ear: External ear normal.  Nose: Nose normal.  Mouth/Throat: Oropharynx is clear and moist.  Eyes: Conjunctivae are normal. Right eye exhibits no discharge. Left eye exhibits no discharge.  Neck: Normal range of motion. Neck supple.  Cardiovascular: Regular rhythm and normal heart sounds.   Tachycardia at 118.   Pulmonary/Chest:  Wheezing/rhonchi bilateral lungs.  Pulse ox 95 on 2L of O2.   Lymphadenopathy:    She has no cervical adenopathy.  Neurological: She is alert and oriented to person, place, and time.  Skin: Skin is dry.  Psychiatric: She has a normal mood and affect. Her behavior is normal.          Assessment & Plan:   COPD exacerbation/smoker- solumedrol 125mg  IM. Prednisone taper. Zpak given today. Continue on O2. Continue to use nebulizers regularly ever 4 hours until feeling better.   Seasonal allergies- wrote for allegra to start after finished abx and prednisone.

## 2014-09-01 NOTE — Patient Instructions (Signed)
Start zpak Continue on O2. Stay on prednisone until sees [name] next week.

## 2014-09-05 ENCOUNTER — Other Ambulatory Visit: Payer: Self-pay | Admitting: Family Medicine

## 2014-09-07 ENCOUNTER — Ambulatory Visit (INDEPENDENT_AMBULATORY_CARE_PROVIDER_SITE_OTHER): Payer: Medicare HMO | Admitting: Family Medicine

## 2014-09-07 ENCOUNTER — Encounter: Payer: Self-pay | Admitting: Family Medicine

## 2014-09-07 ENCOUNTER — Other Ambulatory Visit: Payer: Self-pay | Admitting: Family Medicine

## 2014-09-07 VITALS — BP 136/85 | HR 102 | Temp 98.3°F | Wt 281.0 lb

## 2014-09-07 DIAGNOSIS — J449 Chronic obstructive pulmonary disease, unspecified: Secondary | ICD-10-CM

## 2014-09-07 DIAGNOSIS — R232 Flushing: Secondary | ICD-10-CM

## 2014-09-07 DIAGNOSIS — N951 Menopausal and female climacteric states: Secondary | ICD-10-CM

## 2014-09-07 DIAGNOSIS — Z23 Encounter for immunization: Secondary | ICD-10-CM

## 2014-09-07 DIAGNOSIS — E119 Type 2 diabetes mellitus without complications: Secondary | ICD-10-CM | POA: Insufficient documentation

## 2014-09-07 DIAGNOSIS — I10 Essential (primary) hypertension: Secondary | ICD-10-CM

## 2014-09-07 DIAGNOSIS — E785 Hyperlipidemia, unspecified: Secondary | ICD-10-CM

## 2014-09-07 DIAGNOSIS — E039 Hypothyroidism, unspecified: Secondary | ICD-10-CM

## 2014-09-07 MED ORDER — AMBULATORY NON FORMULARY MEDICATION: Status: DC

## 2014-09-07 MED ORDER — PREDNISONE 10 MG PO TABS: ORAL_TABLET | ORAL | Status: DC

## 2014-09-07 MED ORDER — SITAGLIPTIN PHOSPHATE 100 MG PO TABS: 100.0000 mg | ORAL_TABLET | Freq: Every day | ORAL | Status: DC

## 2014-09-07 NOTE — Patient Instructions (Signed)
Please make an appointment at your convenience in the next couple weeks to have the skin lesions treated and a biopsy performed on the one on her right wrist. Please check your blood sugar 2-3 times per week fasting, before your first meal of the day. Let me know if you think he would be interested in nutrition counseling. Typically insurance will pay for this with a new diagnosis of diabetes. And this can be done and [place].

## 2014-09-07 NOTE — Progress Notes (Signed)
   Subjective:    Patient ID: Krystal Burgess, female    DOB: 03-24-1961, 53 y.o.   MRN: [id number]  HPI Followup COPD-she has switched home health company's to [employer]. She needs a new prescription for a portable concentrator and a portable nebulizer.   She's also requesting referral for dermatology. She has a lesion on her right wrist and left forearm near the antecubital fossa that she feels like might be changing and getting larger.  Hypertension- Pt denies chest pain, SOB, dizziness, or heart palpitations.  Taking meds as directed w/o problems.  Denies medication side effects.    She's also having hot flashes request formal testing done of her hormones. She had a partial hysterectomy so doesn't have periods.   Review of Systems     Objective:   Physical Exam  Constitutional: She is oriented to person, place, and time. She appears well-developed and well-nourished.  HENT:  Head: Normocephalic and atraumatic.  Neck:  Very thick neck.  Cardiovascular: Normal rate, regular rhythm and normal heart sounds.   Pulmonary/Chest: Effort normal and breath sounds normal.  Neurological: She is alert and oriented to person, place, and time.  Skin: Skin is warm and dry.  Small 2-3 mm darkly pigmented hyperkeratotic lesion on the dorsum of the right wrist. She has a more macular brown irregular lesion consistent with a seborrheic keratosis on her left forearm near the antecubital fossa and one on each shoulder and behind each knee.  Psychiatric: She has a normal mood and affect. Her behavior is normal.          Assessment & Plan:  Flu vaccine given today.  Skin lesions- a lesion on her wrist is most consistent with an atypical nevus-recommend biopsy. The lesion near her left forearm and on her shoulders and behind her knees is most consistent with a seborrheic keratosis. These can be treated easily with cryotherapy. Recommend schedule a separate appointment for skin lesions.  Hot flashes -  Will check hormone to see if post menopausal.

## 2014-09-07 NOTE — Assessment & Plan Note (Signed)
Less than lipids was approximately a year ago. Due to repeat lipid panel. Lab slip printed and given.

## 2014-09-07 NOTE — Assessment & Plan Note (Addendum)
New diagnosis.  Will start Januvia 100 mg once daily. Will give samples for one month. Is working well she can call the office back. We'll also provide a new prescription for glucometer, lancets and strips. We'll see her back in 3 months to make sure that she's doing well. Encouraged her to bring in her glucometer with her so I can take a look at her home blood sugar numbers. Encouraged her to test 2-3 times per week before breakfast.

## 2014-09-07 NOTE — Assessment & Plan Note (Signed)
She is improving on her antibiotic and prednisone. She would like to continue with the 10 mg daily prednisone for the next month. Explained her that I do not typically write for long-term prednisone. I will give her one month's worth and encouraged her to think about seeing a specialist. She understands that this is a temporary prescription and encouraged her strongly think about it. Will print new prescriptions for her oxygen concentrator and portable nebulizer sent she is now using a new DME company.

## 2014-09-07 NOTE — Assessment & Plan Note (Addendum)
Blood pressure at goal today on repeat check. Currently on losartan and Lasix.

## 2014-09-07 NOTE — Assessment & Plan Note (Signed)
Due to recheck TSh. Labslip given.

## 2014-09-15 ENCOUNTER — Other Ambulatory Visit: Payer: Self-pay | Admitting: Family Medicine

## 2014-09-18 ENCOUNTER — Other Ambulatory Visit: Payer: Self-pay

## 2014-09-18 MED ORDER — AMBULATORY NON FORMULARY MEDICATION: Status: DC

## 2014-09-23 ENCOUNTER — Other Ambulatory Visit: Payer: Self-pay | Admitting: Physician Assistant

## 2014-09-23 ENCOUNTER — Other Ambulatory Visit: Payer: Self-pay | Admitting: Family Medicine

## 2014-09-28 ENCOUNTER — Other Ambulatory Visit: Payer: Self-pay | Admitting: Physician Assistant

## 2014-09-28 ENCOUNTER — Other Ambulatory Visit: Payer: Self-pay | Admitting: Family Medicine

## 2014-10-03 ENCOUNTER — Ambulatory Visit: Payer: Medicare HMO | Admitting: Family Medicine

## 2014-10-03 ENCOUNTER — Other Ambulatory Visit: Payer: Self-pay

## 2014-10-03 MED ORDER — HYDROCODONE-ACETAMINOPHEN 10-325 MG PO TABS: 1.0000 | ORAL_TABLET | Freq: Four times a day (QID) | ORAL | Status: DC | PRN

## 2014-10-03 NOTE — Telephone Encounter (Signed)
Patient advised to pick up after 2 pm.

## 2014-10-06 ENCOUNTER — Other Ambulatory Visit: Payer: Self-pay

## 2014-10-08 DIAGNOSIS — F411 Generalized anxiety disorder: Secondary | ICD-10-CM | POA: Insufficient documentation

## 2014-10-08 DIAGNOSIS — D72829 Elevated white blood cell count, unspecified: Secondary | ICD-10-CM | POA: Insufficient documentation

## 2014-10-08 DIAGNOSIS — Z8659 Personal history of other mental and behavioral disorders: Secondary | ICD-10-CM | POA: Insufficient documentation

## 2014-10-09 DIAGNOSIS — M6282 Rhabdomyolysis: Secondary | ICD-10-CM | POA: Insufficient documentation

## 2014-10-09 DIAGNOSIS — J9601 Acute respiratory failure with hypoxia: Secondary | ICD-10-CM | POA: Insufficient documentation

## 2014-10-12 DIAGNOSIS — K219 Gastro-esophageal reflux disease without esophagitis: Secondary | ICD-10-CM | POA: Insufficient documentation

## 2014-10-12 DIAGNOSIS — E1142 Type 2 diabetes mellitus with diabetic polyneuropathy: Secondary | ICD-10-CM | POA: Insufficient documentation

## 2014-10-12 DIAGNOSIS — G9340 Encephalopathy, unspecified: Secondary | ICD-10-CM | POA: Insufficient documentation

## 2014-10-16 DIAGNOSIS — B952 Enterococcus as the cause of diseases classified elsewhere: Secondary | ICD-10-CM | POA: Insufficient documentation

## 2014-10-16 DIAGNOSIS — N390 Urinary tract infection, site not specified: Secondary | ICD-10-CM | POA: Insufficient documentation

## 2014-10-18 DIAGNOSIS — K922 Gastrointestinal hemorrhage, unspecified: Secondary | ICD-10-CM | POA: Insufficient documentation

## 2014-10-28 ENCOUNTER — Other Ambulatory Visit: Payer: Self-pay | Admitting: Family Medicine

## 2014-11-02 ENCOUNTER — Other Ambulatory Visit: Payer: Self-pay | Admitting: Family Medicine

## 2014-11-02 ENCOUNTER — Ambulatory Visit: Payer: Medicare HMO | Admitting: Family Medicine

## 2014-11-06 ENCOUNTER — Other Ambulatory Visit: Payer: Self-pay

## 2014-11-06 DIAGNOSIS — IMO0002 Reserved for concepts with insufficient information to code with codable children: Secondary | ICD-10-CM

## 2014-11-06 DIAGNOSIS — E138 Other specified diabetes mellitus with unspecified complications: Principal | ICD-10-CM

## 2014-11-06 DIAGNOSIS — E1365 Other specified diabetes mellitus with hyperglycemia: Secondary | ICD-10-CM

## 2014-11-06 MED ORDER — HYDROCODONE-ACETAMINOPHEN 10-325 MG PO TABS: 1.0000 | ORAL_TABLET | Freq: Four times a day (QID) | ORAL | Status: DC | PRN

## 2014-11-06 MED ORDER — AMBULATORY NON FORMULARY MEDICATION: Status: DC

## 2014-11-07 ENCOUNTER — Telehealth: Payer: Self-pay | Admitting: Family Medicine

## 2014-11-07 NOTE — Telephone Encounter (Signed)
Pt called and informed that her med has been ordered and is up front for p/u.Krystal Burgess

## 2014-11-07 NOTE — Telephone Encounter (Signed)
Pt called. She said left two message last week about her hydrocodone and has not gotten a call. It was due on 11/10.  She took her last pill this morning.

## 2014-11-09 ENCOUNTER — Ambulatory Visit: Payer: Medicare HMO | Admitting: Family Medicine

## 2014-11-22 ENCOUNTER — Other Ambulatory Visit: Payer: Self-pay | Admitting: Family Medicine

## 2014-11-22 ENCOUNTER — Other Ambulatory Visit: Payer: Self-pay | Admitting: Physician Assistant

## 2014-11-27 ENCOUNTER — Other Ambulatory Visit: Payer: Self-pay | Admitting: Family Medicine

## 2014-11-29 ENCOUNTER — Other Ambulatory Visit: Payer: Self-pay | Admitting: Family Medicine

## 2014-12-04 ENCOUNTER — Other Ambulatory Visit: Payer: Self-pay

## 2014-12-04 MED ORDER — HYDROCODONE-ACETAMINOPHEN 10-325 MG PO TABS: 1.0000 | ORAL_TABLET | Freq: Four times a day (QID) | ORAL | Status: DC | PRN

## 2014-12-04 NOTE — Telephone Encounter (Signed)
Krystal Burgess called for refill on hydrocodone.

## 2014-12-06 ENCOUNTER — Other Ambulatory Visit: Payer: Self-pay | Admitting: Physician Assistant

## 2014-12-13 ENCOUNTER — Other Ambulatory Visit: Payer: Self-pay | Admitting: Physician Assistant

## 2014-12-13 ENCOUNTER — Other Ambulatory Visit: Payer: Self-pay | Admitting: Family Medicine

## 2014-12-23 ENCOUNTER — Other Ambulatory Visit: Payer: Self-pay | Admitting: Physician Assistant

## 2014-12-23 ENCOUNTER — Other Ambulatory Visit: Payer: Self-pay | Admitting: Family Medicine

## 2015-01-03 ENCOUNTER — Other Ambulatory Visit: Payer: Self-pay

## 2015-01-03 MED ORDER — HYDROCODONE-ACETAMINOPHEN 10-325 MG PO TABS: 1.0000 | ORAL_TABLET | Freq: Four times a day (QID) | ORAL | Status: DC | PRN

## 2015-01-07 DIAGNOSIS — J449 Chronic obstructive pulmonary disease, unspecified: Secondary | ICD-10-CM | POA: Diagnosis not present

## 2015-01-11 ENCOUNTER — Other Ambulatory Visit: Payer: Self-pay | Admitting: Family Medicine

## 2015-01-15 ENCOUNTER — Ambulatory Visit (INDEPENDENT_AMBULATORY_CARE_PROVIDER_SITE_OTHER): Payer: Medicare HMO | Admitting: Family Medicine

## 2015-01-15 ENCOUNTER — Encounter: Payer: Self-pay | Admitting: Family Medicine

## 2015-01-15 VITALS — BP 114/67 | HR 97 | Wt 260.0 lb

## 2015-01-15 DIAGNOSIS — N179 Acute kidney failure, unspecified: Secondary | ICD-10-CM

## 2015-01-15 DIAGNOSIS — I10 Essential (primary) hypertension: Secondary | ICD-10-CM

## 2015-01-15 DIAGNOSIS — D649 Anemia, unspecified: Secondary | ICD-10-CM | POA: Diagnosis not present

## 2015-01-15 DIAGNOSIS — R300 Dysuria: Secondary | ICD-10-CM

## 2015-01-15 DIAGNOSIS — K922 Gastrointestinal hemorrhage, unspecified: Secondary | ICD-10-CM | POA: Diagnosis not present

## 2015-01-15 DIAGNOSIS — E119 Type 2 diabetes mellitus without complications: Secondary | ICD-10-CM

## 2015-01-15 DIAGNOSIS — J441 Chronic obstructive pulmonary disease with (acute) exacerbation: Secondary | ICD-10-CM

## 2015-01-15 DIAGNOSIS — Z79899 Other long term (current) drug therapy: Secondary | ICD-10-CM | POA: Diagnosis not present

## 2015-01-15 DIAGNOSIS — E531 Pyridoxine deficiency: Secondary | ICD-10-CM | POA: Diagnosis not present

## 2015-01-15 DIAGNOSIS — N951 Menopausal and female climacteric states: Secondary | ICD-10-CM | POA: Diagnosis not present

## 2015-01-15 LAB — POCT URINALYSIS DIPSTICK
Bilirubin, UA: NEGATIVE
Blood, UA: NEGATIVE
Glucose, UA: NEGATIVE
Ketones, UA: NEGATIVE
Leukocytes, UA: NEGATIVE
Nitrite, UA: NEGATIVE
Protein, UA: NEGATIVE
Spec Grav, UA: <=1.005
Urobilinogen, UA: 0.2
pH, UA: 5.5

## 2015-01-15 MED ORDER — AZITHROMYCIN 250 MG PO TABS: ORAL_TABLET | ORAL | Status: DC

## 2015-01-15 MED ORDER — PREDNISONE 20 MG PO TABS: 40.0000 mg | ORAL_TABLET | Freq: Every day | ORAL | Status: DC

## 2015-01-15 MED ORDER — QUETIAPINE FUMARATE 200 MG PO TABS: 200.0000 mg | ORAL_TABLET | Freq: Two times a day (BID) | ORAL | Status: DC

## 2015-01-15 NOTE — Progress Notes (Signed)
Subjective:    Patient ID: Krystal Burgess, female    DOB: 1961/04/11, 53 y.o.   MRN: [id number]  HPI 54 year old female with a history of severe COPD and diabetes was admitted to the hospital on 10/09/2014 and hospitalized for 16 days. She was diagnosed with a lower GI bleed and anemia blood loss due to GI hemorrhage. She was also diagnosed with a urinary tract infection with culture positive for enterococcus. She also severed from acute encephalopathy while there. She lost over 20 pounds while in the hospital. She had persistent respiratory acidosis and altered mental status which required intubation. This was felt to be secondary to her medications in the setting of acute renal failure. Transfused 3 units of blood. She was also diagnosed with rhabdomyolysis. She had severe leg pain and cramping while in the hospital. She says overall looks much better but still has some pain and tenderness at times.  Neuropathy/chronic back pain-She is taking gabapentin 600mg  at bedtime. When she was discharged from hospital she was supposed to decrease her dose 200 mg twice a day that she is still taking 60 mg at bedtime.  Type 2 diabetes-she did bring in glucometer with her today. 14 day average is 114. She has not had any highs or lows since being home. She says she's been eating a lot of ice and drinking more water. In fact she has been craving ice   She's worried she is getting sick again. For the last 3-4 days she has had sinus congestion and cough and increased shortness of breath with some sputum production. She has a history of severe COPD and Asthma.  Review of Systems     Objective:   Physical Exam  Constitutional: She is oriented to person, place, and time. She appears well-developed and well-nourished.  HENT:  Head: Normocephalic and atraumatic.  Right Ear: External ear normal.  Left Ear: External ear normal.  Nose: Nose normal.  Mouth/Throat: Oropharynx is clear and moist.  TMs and canals  are clear.   Eyes: Conjunctivae and EOM are normal. Pupils are equal, round, and reactive to light.  Neck: Neck supple. No thyromegaly present.  Cardiovascular: Normal rate, regular rhythm and normal heart sounds.   Pulmonary/Chest: Effort normal and breath sounds normal. She has no wheezes.  Lymphadenopathy:    She has no cervical adenopathy.  Neurological: She is alert and oriented to person, place, and time.  Skin: Skin is warm and dry.  Psychiatric: She has a normal mood and affect.          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    Rhabdomylysis - will recheck CK.  Acute renal failure. Due to recheck BUN and creatinine to make sure back to baseline. Her that sometimes medications to have to be renally adjusted and that's why they may have made some of the changes that they made at the hospital. They also are wanting to decrease some of her sedative medications including the gabapentin and Xanax and Ambien.  Hypomagnesium - due to recheck levels. She was taking a supplement up until a few days ago when she says she ran out. She says she was also told to take extra beat vitamin as well as vitamin D and iron.  History of upper GI bleed-we'll recheck hemoglobin to see where she's out. Also recheck ferritin level. This was felt to be secondary to prednisone and NSAIDs.  Type 2 diabetes-right now she's off of  all medication for diabetes. Her blood sugars for a 14 day average is 114 which looks fantastic. If she starts to notice a increase over 130 with her blood sugars and please give the office a call. We can always reconsider starting the Januvia that we may have to dose it for 50 mg because of renal function.  COPD exacerbation-we'll go ahead and treat with 5 days of prednisone and azithromycin. Please call if not significantly better in one week.

## 2015-01-16 LAB — MAGNESIUM: Magnesium: 1.7 mg/dL (ref 1.5–2.5)

## 2015-01-16 LAB — CBC
HCT: 31.4 % — ABNORMAL LOW (ref 36.0–46.0)
Hemoglobin: 10 g/dL — ABNORMAL LOW (ref 12.0–15.0)
MCH: 28.2 pg (ref 26.0–34.0)
MCHC: 31.8 g/dL (ref 30.0–36.0)
MCV: 88.7 fL (ref 78.0–100.0)
MPV: 9.2 fL (ref 8.6–12.4)
Platelets: 333 10*3/uL (ref 150–400)
RBC: 3.54 MIL/uL — AB (ref 3.87–5.11)
RDW: 13.4 % (ref 11.5–15.5)
WBC: 10.6 10*3/uL — ABNORMAL HIGH (ref 4.0–10.5)

## 2015-01-16 LAB — COMPLETE METABOLIC PANEL WITH GFR
ALK PHOS: 90 U/L (ref 39–117)
ALT: 8 U/L (ref 0–35)
AST: 13 U/L (ref 0–37)
Albumin: 3.5 g/dL (ref 3.5–5.2)
BILIRUBIN TOTAL: 0.2 mg/dL (ref 0.2–1.2)
BUN: 6 mg/dL (ref 6–23)
CO2: 27 mEq/L (ref 19–32)
CREATININE: 1.46 mg/dL — AB (ref 0.50–1.10)
Calcium: 9.1 mg/dL (ref 8.4–10.5)
Chloride: 105 mEq/L (ref 96–112)
GFR, EST NON AFRICAN AMERICAN: 41 mL/min — AB
GFR, Est African American: 48 mL/min — ABNORMAL LOW
Glucose, Bld: 76 mg/dL (ref 70–99)
Potassium: 4.2 mEq/L (ref 3.5–5.3)
Sodium: 141 mEq/L (ref 135–145)
Total Protein: 6.1 g/dL (ref 6.0–8.3)

## 2015-01-16 LAB — LUTEINIZING HORMONE: LH: 0.2 m[IU]/mL

## 2015-01-16 LAB — FERRITIN: FERRITIN: 11 ng/mL (ref 10–291)

## 2015-01-16 LAB — PROGESTERONE

## 2015-01-16 LAB — ESTRADIOL

## 2015-01-16 LAB — FOLLICLE STIMULATING HORMONE: FSH: 6.9 m[IU]/mL

## 2015-01-16 LAB — TSH: TSH: 3.771 u[IU]/mL (ref 0.350–4.500)

## 2015-01-17 ENCOUNTER — Other Ambulatory Visit: Payer: Self-pay | Admitting: *Deleted

## 2015-01-17 DIAGNOSIS — R7989 Other specified abnormal findings of blood chemistry: Secondary | ICD-10-CM

## 2015-01-19 LAB — VITAMIN B1

## 2015-01-20 DIAGNOSIS — J449 Chronic obstructive pulmonary disease, unspecified: Secondary | ICD-10-CM | POA: Diagnosis not present

## 2015-01-20 DIAGNOSIS — J4520 Mild intermittent asthma, uncomplicated: Secondary | ICD-10-CM | POA: Diagnosis not present

## 2015-01-21 LAB — VITAMIN B6: Vitamin B6: 2.6 ng/mL (ref 2.1–21.7)

## 2015-01-22 ENCOUNTER — Other Ambulatory Visit: Payer: Self-pay | Admitting: Family Medicine

## 2015-01-22 ENCOUNTER — Other Ambulatory Visit: Payer: Self-pay | Admitting: *Deleted

## 2015-01-22 DIAGNOSIS — E039 Hypothyroidism, unspecified: Secondary | ICD-10-CM

## 2015-01-22 DIAGNOSIS — Z780 Asymptomatic menopausal state: Secondary | ICD-10-CM

## 2015-01-23 ENCOUNTER — Other Ambulatory Visit: Payer: Self-pay | Admitting: Family Medicine

## 2015-01-23 ENCOUNTER — Other Ambulatory Visit: Payer: Self-pay | Admitting: Physician Assistant

## 2015-01-24 ENCOUNTER — Other Ambulatory Visit: Payer: Self-pay | Admitting: Family Medicine

## 2015-01-24 MED ORDER — FLUTICASONE FUROATE-VILANTEROL 100-25 MCG/INH IN AEPB: INHALATION_SPRAY | RESPIRATORY_TRACT | Status: DC

## 2015-01-24 NOTE — Telephone Encounter (Signed)
Requesting patient to call and tell us if she is still using Breo-Ellipta since refill request follow up does not show she has been refilling it since 06-2015.

## 2015-01-24 NOTE — Telephone Encounter (Signed)
Needs appt before additional refills.

## 2015-01-28 ENCOUNTER — Other Ambulatory Visit: Payer: Self-pay | Admitting: Family Medicine

## 2015-01-31 ENCOUNTER — Other Ambulatory Visit: Payer: Self-pay

## 2015-01-31 ENCOUNTER — Other Ambulatory Visit: Payer: Self-pay | Admitting: Family Medicine

## 2015-01-31 MED ORDER — HYDROCODONE-ACETAMINOPHEN 10-325 MG PO TABS: 1.0000 | ORAL_TABLET | Freq: Four times a day (QID) | ORAL | Status: DC | PRN

## 2015-01-31 NOTE — Telephone Encounter (Signed)
Ok to pick up tomorrow morning.

## 2015-02-02 ENCOUNTER — Other Ambulatory Visit: Payer: Self-pay | Admitting: Family Medicine

## 2015-02-05 ENCOUNTER — Other Ambulatory Visit: Payer: Self-pay | Admitting: Family Medicine

## 2015-02-06 ENCOUNTER — Other Ambulatory Visit: Payer: Self-pay | Admitting: Physician Assistant

## 2015-02-07 ENCOUNTER — Other Ambulatory Visit: Payer: Self-pay | Admitting: Family Medicine

## 2015-02-07 DIAGNOSIS — J449 Chronic obstructive pulmonary disease, unspecified: Secondary | ICD-10-CM | POA: Diagnosis not present

## 2015-02-08 ENCOUNTER — Other Ambulatory Visit: Payer: Self-pay | Admitting: Physician Assistant

## 2015-02-09 ENCOUNTER — Other Ambulatory Visit: Payer: Self-pay | Admitting: Emergency Medicine

## 2015-02-09 NOTE — Telephone Encounter (Signed)
Medication was discontinued on 01/15/2015 due to change in therapy. I will call patient.

## 2015-02-09 NOTE — Telephone Encounter (Signed)
Request for theophylline refill. Last visit this medication was discontinued due to change in therapy. I will call patient.

## 2015-02-09 NOTE — Telephone Encounter (Signed)
Left message for patient to call us re: 2 refill requests from pharmacy for medications we do not have on her current medication list: Theophylline and Robaxin.

## 2015-02-12 ENCOUNTER — Other Ambulatory Visit: Payer: Self-pay | Admitting: Family Medicine

## 2015-02-18 ENCOUNTER — Other Ambulatory Visit: Payer: Self-pay | Admitting: Family Medicine

## 2015-02-19 DIAGNOSIS — J449 Chronic obstructive pulmonary disease, unspecified: Secondary | ICD-10-CM | POA: Diagnosis not present

## 2015-02-19 DIAGNOSIS — J4520 Mild intermittent asthma, uncomplicated: Secondary | ICD-10-CM | POA: Diagnosis not present

## 2015-02-19 NOTE — Telephone Encounter (Signed)
Not on current medication list.   

## 2015-02-27 ENCOUNTER — Other Ambulatory Visit: Payer: Self-pay | Admitting: Family Medicine

## 2015-02-28 ENCOUNTER — Other Ambulatory Visit: Payer: Self-pay

## 2015-02-28 MED ORDER — ALPRAZOLAM 1 MG PO TABS: 1.0000 mg | ORAL_TABLET | Freq: Three times a day (TID) | ORAL | Status: DC

## 2015-03-01 ENCOUNTER — Other Ambulatory Visit: Payer: Self-pay | Admitting: Family Medicine

## 2015-03-05 ENCOUNTER — Encounter: Payer: Self-pay | Admitting: Family Medicine

## 2015-03-05 ENCOUNTER — Ambulatory Visit (INDEPENDENT_AMBULATORY_CARE_PROVIDER_SITE_OTHER): Payer: Medicare HMO | Admitting: Family Medicine

## 2015-03-05 VITALS — BP 135/86 | HR 95 | Wt 255.0 lb

## 2015-03-05 DIAGNOSIS — R1312 Dysphagia, oropharyngeal phase: Secondary | ICD-10-CM | POA: Diagnosis not present

## 2015-03-05 DIAGNOSIS — N179 Acute kidney failure, unspecified: Secondary | ICD-10-CM

## 2015-03-05 DIAGNOSIS — J441 Chronic obstructive pulmonary disease with (acute) exacerbation: Secondary | ICD-10-CM

## 2015-03-05 DIAGNOSIS — K922 Gastrointestinal hemorrhage, unspecified: Secondary | ICD-10-CM | POA: Diagnosis not present

## 2015-03-05 DIAGNOSIS — I10 Essential (primary) hypertension: Secondary | ICD-10-CM | POA: Diagnosis not present

## 2015-03-05 DIAGNOSIS — E119 Type 2 diabetes mellitus without complications: Secondary | ICD-10-CM | POA: Diagnosis not present

## 2015-03-05 DIAGNOSIS — R300 Dysuria: Secondary | ICD-10-CM

## 2015-03-05 DIAGNOSIS — D509 Iron deficiency anemia, unspecified: Secondary | ICD-10-CM | POA: Diagnosis not present

## 2015-03-05 LAB — POCT GLYCOSYLATED HEMOGLOBIN (HGB A1C): Hemoglobin A1C: 6.3

## 2015-03-05 MED ORDER — DOXYCYCLINE HYCLATE 100 MG PO TABS: 100.0000 mg | ORAL_TABLET | Freq: Two times a day (BID) | ORAL | Status: DC

## 2015-03-05 MED ORDER — HYDROCODONE-ACETAMINOPHEN 10-325 MG PO TABS: 1.0000 | ORAL_TABLET | Freq: Four times a day (QID) | ORAL | Status: DC | PRN

## 2015-03-05 MED ORDER — PREDNISONE 20 MG PO TABS: 40.0000 mg | ORAL_TABLET | Freq: Every day | ORAL | Status: DC

## 2015-03-05 MED ORDER — QUETIAPINE FUMARATE 200 MG PO TABS: 200.0000 mg | ORAL_TABLET | Freq: Two times a day (BID) | ORAL | Status: DC

## 2015-03-05 MED ORDER — AMBULATORY NON FORMULARY MEDICATION: Status: DC

## 2015-03-05 MED ORDER — METHOCARBAMOL 500 MG PO TABS: 500.0000 mg | ORAL_TABLET | Freq: Three times a day (TID) | ORAL | Status: DC | PRN

## 2015-03-05 MED ORDER — THEOPHYLLINE ER 100 MG PO TB12: ORAL_TABLET | ORAL | Status: DC

## 2015-03-05 NOTE — Progress Notes (Signed)
   Subjective:    Patient ID: Krystal Burgess, female    DOB: August 12, 1961, 54 y.o.   MRN: [id number]  HPI Follow-up COPD/asthma - needs new rx for oxygen concentrator that is battery operated.  And the portable oxygen that is battery operated, thought [employer].   Diabetes-forgot to bring her meter. Says the highest is 140s. Lowest in the 80s fasting.  Has cut her soda inahlf.   Lab Results  Component Value Date   HGBA1C 5.8 10/06/2013    She would like to get her Robaxin refilled. We have been filling it for 30 tabs per month. At the last office visit in February we decreased it to 15. As this should really be used as needed.  Iron deficiency- she just started her iron a few weeks ago.    Says her food is still getting hung in her throat even after esophageal dilatation.  Noticed this started when her seroquel was changed to generic.  She feels teh two are related.   Review of Systems     Objective:   Physical Exam  Constitutional: She is oriented to person, place, and time. She appears well-developed and well-nourished.  HENT:  Head: Normocephalic and atraumatic.  Right Ear: External ear normal.  Left Ear: External ear normal.  Nose: Nose normal.  Mouth/Throat: Oropharynx is clear and moist.  TMs and canals are clear.   Eyes: Conjunctivae and EOM are normal. Pupils are equal, round, and reactive to light.  Neck: Neck supple. No thyromegaly present.  Cardiovascular: Normal rate, regular rhythm and normal heart sounds.   Pulmonary/Chest: Effort normal. She has wheezes.  Wheezing in the upper and lower left lung  Lymphadenopathy:    She has no cervical adenopathy.  Neurological: She is alert and oriented to person, place, and time.  Skin: Skin is warm and dry.  Psychiatric: She has a normal mood and affect.          Assessment & Plan:  DM- well controlled. Continue current regimen. A1c is 6.3 today. Follow-up in 3 months. On statin.    COPD exacerbation/asthma acute  sinusitis- She feels liek the dust has really aggrevated her sinuses. She is still smoking.  will tx Pred, doxy.  Depression - She has decided to pack and move and has been stressful for her.    Opioid induced constipation - Recommend mirlax BID until bowels are  Moving with no difficulty. Then dec to once a day.   Iron def- now on iron. Will recheck levels in 2-3 months.   Oropharyngeal dysphagia.  I am not as convined it is her seroquel but will change back to brand and see if makes a difference. If it doesn't and strongly encouraged her to get back in a GI. That discussed going ahead and making an appointment with GI while we're in the midst of trying to change the medication.

## 2015-03-06 ENCOUNTER — Encounter: Payer: Self-pay | Admitting: Family Medicine

## 2015-03-08 DIAGNOSIS — J449 Chronic obstructive pulmonary disease, unspecified: Secondary | ICD-10-CM | POA: Diagnosis not present

## 2015-03-13 ENCOUNTER — Other Ambulatory Visit: Payer: Self-pay | Admitting: Physician Assistant

## 2015-03-16 ENCOUNTER — Other Ambulatory Visit: Payer: Self-pay | Admitting: Family Medicine

## 2015-03-21 ENCOUNTER — Other Ambulatory Visit: Payer: Self-pay | Admitting: Family Medicine

## 2015-03-21 DIAGNOSIS — J449 Chronic obstructive pulmonary disease, unspecified: Secondary | ICD-10-CM | POA: Diagnosis not present

## 2015-03-21 DIAGNOSIS — J4520 Mild intermittent asthma, uncomplicated: Secondary | ICD-10-CM | POA: Diagnosis not present

## 2015-03-22 ENCOUNTER — Other Ambulatory Visit: Payer: Self-pay | Admitting: Family Medicine

## 2015-03-22 ENCOUNTER — Telehealth: Payer: Self-pay | Admitting: *Deleted

## 2015-03-22 MED ORDER — LEVOFLOXACIN 500 MG PO TABS: 500.0000 mg | ORAL_TABLET | Freq: Every day | ORAL | Status: AC

## 2015-03-22 NOTE — Telephone Encounter (Signed)
Should have jsut finished her doxy 6 days ago.  Will send over script for new antibiotic. If not better in 5-7 days then needs appt.

## 2015-03-22 NOTE — Telephone Encounter (Signed)
Pt states she is in the process of moving and the dust is causing her to have nasal congestion. She request a rx for an abx and oral steroid. Pt was just seen on 03/05/15. Please advise

## 2015-03-23 NOTE — Telephone Encounter (Signed)
Notified daughter

## 2015-03-27 ENCOUNTER — Other Ambulatory Visit: Payer: Self-pay | Admitting: Family Medicine

## 2015-03-29 ENCOUNTER — Other Ambulatory Visit: Payer: Self-pay | Admitting: Family Medicine

## 2015-03-30 ENCOUNTER — Other Ambulatory Visit: Payer: Self-pay | Admitting: *Deleted

## 2015-03-30 NOTE — Patient Outreach (Signed)
[facility]) Care Management  03/30/2015  Krystal Burgess [birth year] [id number]  [employer] referral; Second telephone call attempt; left message on voice mail requesting call back  Plan: will follow up. Colleen Can, RN BSN CCM Care Management Coordinator [facility] Care Management  [phone]

## 2015-04-02 ENCOUNTER — Other Ambulatory Visit: Payer: Medicare HMO | Admitting: *Deleted

## 2015-04-02 ENCOUNTER — Encounter: Payer: Self-pay | Admitting: *Deleted

## 2015-04-02 NOTE — Patient Outreach (Addendum)
[facility]) Care Management  04/02/2015  Krystal Burgess 1961-04-24 [id number]    [employer] Tier 4 referral:   Telephone call # 3 today; left message on voice mail.  Plan: will send outreach letter to patient; follow up 10 business days.   Colleen Can, RN BSN CCM Care Management Coordinator [facility] Care Management  [phone]

## 2015-04-03 ENCOUNTER — Ambulatory Visit: Payer: Self-pay | Admitting: Family Medicine

## 2015-04-03 ENCOUNTER — Other Ambulatory Visit: Payer: Self-pay | Admitting: Family Medicine

## 2015-04-03 MED ORDER — AMBULATORY NON FORMULARY MEDICATION: Status: DC

## 2015-04-03 MED ORDER — HYDROCODONE-ACETAMINOPHEN 10-325 MG PO TABS: 1.0000 | ORAL_TABLET | Freq: Four times a day (QID) | ORAL | Status: DC | PRN

## 2015-04-08 DIAGNOSIS — J449 Chronic obstructive pulmonary disease, unspecified: Secondary | ICD-10-CM | POA: Diagnosis not present

## 2015-04-10 ENCOUNTER — Ambulatory Visit: Payer: Medicare HMO | Admitting: Family Medicine

## 2015-04-16 ENCOUNTER — Encounter: Payer: Self-pay | Admitting: *Deleted

## 2015-04-16 NOTE — Patient Outreach (Signed)
[facility]) Care Management  04/16/2015  TERRIL LALLY 1961/08/28 [id number]  No response from patient after 3 call attempts and outreach letter.  Plan: will close case and send MD closure letter.  Colleen Can, RN BSN CCM Care Management Coordinator [facility] Care Management  [phone]

## 2015-04-18 ENCOUNTER — Telehealth: Payer: Self-pay

## 2015-04-18 ENCOUNTER — Other Ambulatory Visit: Payer: Self-pay | Admitting: Family Medicine

## 2015-04-18 NOTE — Patient Outreach (Signed)
[facility]) Care Management  04/18/2015  VIVAN ALIOTTA February 28, 1961 [id number]   Received notification from Colleen Can, RN to close case due to unable to contact patient.  Corrie Mckusick. [facility] Care Management [facility] CM Assistant Phone: [phone] Fax: [phone]

## 2015-04-18 NOTE — Telephone Encounter (Signed)
As far as I am aware there is no substitute unless the pharmacist is made a recommendation. She may just have to go without it for a while.

## 2015-04-18 NOTE — Telephone Encounter (Signed)
Krystal Burgess called and reports the theophylline is on manufacture back order. What can she take in place of the theophylline?

## 2015-04-19 NOTE — Telephone Encounter (Signed)
Patient called again and request to have elixophylline 80mg  and Aminophylline 250 & 500 mg. She stated insurance will cover both pharmacy [facility] [address] Thanks

## 2015-04-20 ENCOUNTER — Other Ambulatory Visit: Payer: Self-pay | Admitting: Family Medicine

## 2015-04-20 MED ORDER — AMINOPHYLLINE 200 MG PO TABS: 200.0000 mg | ORAL_TABLET | Freq: Two times a day (BID) | ORAL | Status: DC

## 2015-04-20 NOTE — Telephone Encounter (Signed)
Okay. Sent over prescription for the Aminophyllin 200 mg tab. I'm not sure with a 250 in the 500 comes from. I can only find that it comes and 100 mg and 200 mg. The pharmacy will call us back if they don't have this in stock.

## 2015-04-20 NOTE — Telephone Encounter (Signed)
They no longer make Aminophylline. She would like to try Elixophyllin 80 mg.

## 2015-04-20 NOTE — Telephone Encounter (Signed)
Gave VO for Theophyline ER 300 mg tabs to cut 1/2 #30 with 1 refill.Loralee Pacas [place]

## 2015-04-24 ENCOUNTER — Other Ambulatory Visit: Payer: Self-pay | Admitting: Family Medicine

## 2015-04-24 ENCOUNTER — Other Ambulatory Visit: Payer: Self-pay | Admitting: Sports Medicine

## 2015-04-28 ENCOUNTER — Other Ambulatory Visit: Payer: Self-pay | Admitting: Family Medicine

## 2015-04-30 ENCOUNTER — Other Ambulatory Visit: Payer: Self-pay | Admitting: Family Medicine

## 2015-05-06 DIAGNOSIS — J449 Chronic obstructive pulmonary disease, unspecified: Secondary | ICD-10-CM | POA: Diagnosis not present

## 2015-05-06 DIAGNOSIS — J4520 Mild intermittent asthma, uncomplicated: Secondary | ICD-10-CM | POA: Diagnosis not present

## 2015-05-08 DIAGNOSIS — J449 Chronic obstructive pulmonary disease, unspecified: Secondary | ICD-10-CM | POA: Diagnosis not present

## 2015-05-18 ENCOUNTER — Ambulatory Visit: Payer: Self-pay | Admitting: Family Medicine

## 2015-05-25 ENCOUNTER — Other Ambulatory Visit: Payer: Self-pay | Admitting: *Deleted

## 2015-05-25 MED ORDER — HYDROCODONE-ACETAMINOPHEN 10-325 MG PO TABS: 1.0000 | ORAL_TABLET | Freq: Four times a day (QID) | ORAL | Status: DC | PRN

## 2015-05-28 ENCOUNTER — Other Ambulatory Visit: Payer: Self-pay | Admitting: Sports Medicine

## 2015-05-28 ENCOUNTER — Other Ambulatory Visit: Payer: Self-pay | Admitting: Family Medicine

## 2015-06-01 ENCOUNTER — Other Ambulatory Visit: Payer: Self-pay | Admitting: Physician Assistant

## 2015-06-08 ENCOUNTER — Encounter: Payer: Self-pay | Admitting: Family Medicine

## 2015-06-08 ENCOUNTER — Ambulatory Visit (INDEPENDENT_AMBULATORY_CARE_PROVIDER_SITE_OTHER): Payer: Medicare HMO | Admitting: Family Medicine

## 2015-06-08 VITALS — BP 127/71 | HR 104 | Wt 244.0 lb

## 2015-06-08 DIAGNOSIS — Z1239 Encounter for other screening for malignant neoplasm of breast: Secondary | ICD-10-CM

## 2015-06-08 DIAGNOSIS — E039 Hypothyroidism, unspecified: Secondary | ICD-10-CM | POA: Diagnosis not present

## 2015-06-08 DIAGNOSIS — E119 Type 2 diabetes mellitus without complications: Secondary | ICD-10-CM

## 2015-06-08 DIAGNOSIS — Z780 Asymptomatic menopausal state: Secondary | ICD-10-CM

## 2015-06-08 DIAGNOSIS — Z114 Encounter for screening for human immunodeficiency virus [HIV]: Secondary | ICD-10-CM

## 2015-06-08 DIAGNOSIS — R809 Proteinuria, unspecified: Secondary | ICD-10-CM

## 2015-06-08 DIAGNOSIS — Z1159 Encounter for screening for other viral diseases: Secondary | ICD-10-CM

## 2015-06-08 DIAGNOSIS — I10 Essential (primary) hypertension: Secondary | ICD-10-CM

## 2015-06-08 DIAGNOSIS — J449 Chronic obstructive pulmonary disease, unspecified: Secondary | ICD-10-CM | POA: Diagnosis not present

## 2015-06-08 LAB — POCT UA - MICROALBUMIN
ALBUMIN/CREATININE RATIO, URINE, POC: ABNORMAL
Creatinine, POC: 300 mg/dL
Microalbumin Ur, POC: 80 mg/L

## 2015-06-08 LAB — POCT GLYCOSYLATED HEMOGLOBIN (HGB A1C): Hemoglobin A1C: 6

## 2015-06-08 MED ORDER — DICLOFENAC SODIUM 1 % TD GEL
2.0000 g | Freq: Four times a day (QID) | TRANSDERMAL | Status: DC
Start: 2015-06-08 — End: 2015-12-21

## 2015-06-08 MED ORDER — VARENICLINE TARTRATE 0.5 MG X 11 & 1 MG X 42 PO MISC: ORAL | Status: DC

## 2015-06-08 MED ORDER — ROFLUMILAST 500 MCG PO TABS: ORAL_TABLET | ORAL | Status: DC

## 2015-06-08 MED ORDER — PREGABALIN 50 MG PO CAPS: 50.0000 mg | ORAL_CAPSULE | Freq: Three times a day (TID) | ORAL | Status: DC

## 2015-06-08 MED ORDER — DULOXETINE HCL 30 MG PO CPEP: 90.0000 mg | ORAL_CAPSULE | Freq: Every day | ORAL | Status: DC

## 2015-06-08 MED ORDER — OLOPATADINE HCL 0.2 % OP SOLN: 1.0000 [drp] | Freq: Every day | OPHTHALMIC | Status: DC

## 2015-06-08 MED ORDER — FEXOFENADINE HCL 180 MG PO TABS: 180.0000 mg | ORAL_TABLET | Freq: Every day | ORAL | Status: DC

## 2015-06-08 MED ORDER — QUETIAPINE FUMARATE 200 MG PO TABS: ORAL_TABLET | ORAL | Status: DC

## 2015-06-08 NOTE — Progress Notes (Signed)
   Subjective:    Patient ID: Krystal Burgess, female    DOB: 02-20-1961, 54 y.o.   MRN: [id number]  HPI Diabetes - no hypoglycemic events. No wounds or sores that are not healing well. No increased thirst or urination. Checking glucose at home. Taking medications as prescribed without any side effects.  Hypertension- Pt denies chest pain, SOB, dizziness, or heart palpitations.  Taking meds as directed w/o problems.  Denies medication side effects.    COPD/asthma - on Breo, daliresp, spiriva.  Doing well. She feels better on the theophylline 300mg  and says one BID is better than 1/2 BID, Not having to use her inhaler as much.    Right eye redness and pain - now has had itchey eyes and would like something for allergies.   Tob abuse - would like to try chantix again. She tried it at one point in time and was able to get down to about 10 cigarettes per day. She typically smokes 2 packs per day. She did well until she had some issues with her ex-husband and she started smoking again stop the medication. This was a couple years ago but she is wanting to retry it again.   Review of Systems     Objective:   Physical Exam  Constitutional: She is oriented to person, place, and time. She appears well-developed and well-nourished.  HENT:  Head: Normocephalic and atraumatic.  Eyes: EOM are normal. Right eye exhibits no discharge. Left eye exhibits no discharge.  Right sclera is injected.  Cardiovascular: Normal rate, regular rhythm and normal heart sounds.   Pulmonary/Chest: Effort normal and breath sounds normal.  Musculoskeletal: She exhibits no edema.  Neurological: She is alert and oriented to person, place, and time.  Skin: Skin is warm and dry.  Psychiatric: She has a normal mood and affect. Her behavior is normal.        Assessment & Plan:  DM-  On statin. A1C is 6.0.  + urine micro. Foot exam performed today. Reminded due for eye exam    Hypertension- Pt denies chest pain, SOB,  dizziness, or heart palpitations.  Taking meds as directed w/o problems.  Denies medication side effects.    COPD/Astma- Stable. Doing better on ic dose of theophyllin.  Discussed the importance of smoking cessation.  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 Needs to get back in for eye exam.    Tob abuse - will retry chantix.    Joint pain - will restart volteran gel. Her neurologist was previously prescribing it and it was helpful. She would like a new prescription for this.  Neuropathy - after much discussion we decided to change gabapentin to lyrica and see if works better for her.   Time spent 45 minutes, greater than 50% time spent discussing diabetes, blood pressure, COPD, allergic eye, smoking cessation and tobacco abuse, joint pain, neuropathy

## 2015-06-08 NOTE — Patient Instructions (Signed)
Can try Zaditor for allergic eye ( over the counter ) if the prescription isn't covered.

## 2015-06-11 ENCOUNTER — Other Ambulatory Visit: Payer: Self-pay | Admitting: *Deleted

## 2015-06-11 MED ORDER — THEOPHYLLINE ER 100 MG PO TB12: ORAL_TABLET | ORAL | Status: DC

## 2015-06-12 ENCOUNTER — Other Ambulatory Visit: Payer: Self-pay | Admitting: Family Medicine

## 2015-06-12 ENCOUNTER — Encounter: Payer: Self-pay | Admitting: Family Medicine

## 2015-06-13 ENCOUNTER — Other Ambulatory Visit: Payer: Self-pay | Admitting: Family Medicine

## 2015-06-13 NOTE — Telephone Encounter (Signed)
Pt sent the following message in mychart:   Hi, the insurance isn't covering the duloxetine with the way it is written which is wrote as 3 30mg  capsules a day. Same thing happened last month and the prescription had to be wrote as two different ones. One for 60 MG and one for 30 MG, that way the insurance will cover it. I have been out of this medicine for a bit now. Could you please send in a new prescription for it written that way instead of 3 30 MG a day please.

## 2015-06-14 ENCOUNTER — Other Ambulatory Visit: Payer: Self-pay | Admitting: Family Medicine

## 2015-06-14 MED ORDER — DULOXETINE HCL 30 MG PO CPEP: 30.0000 mg | ORAL_CAPSULE | Freq: Every day | ORAL | Status: DC

## 2015-06-14 MED ORDER — DULOXETINE HCL 60 MG PO CPEP: 60.0000 mg | ORAL_CAPSULE | Freq: Every day | ORAL | Status: DC

## 2015-06-16 ENCOUNTER — Other Ambulatory Visit: Payer: Self-pay | Admitting: Physician Assistant

## 2015-06-18 ENCOUNTER — Other Ambulatory Visit: Payer: Self-pay

## 2015-06-26 ENCOUNTER — Other Ambulatory Visit: Payer: Self-pay | Admitting: Physician Assistant

## 2015-06-26 ENCOUNTER — Other Ambulatory Visit: Payer: Self-pay | Admitting: Family Medicine

## 2015-06-27 DIAGNOSIS — J449 Chronic obstructive pulmonary disease, unspecified: Secondary | ICD-10-CM | POA: Diagnosis not present

## 2015-07-08 DIAGNOSIS — J449 Chronic obstructive pulmonary disease, unspecified: Secondary | ICD-10-CM | POA: Diagnosis not present

## 2015-07-17 ENCOUNTER — Other Ambulatory Visit: Payer: Self-pay | Admitting: *Deleted

## 2015-07-17 MED ORDER — HYDROCODONE-ACETAMINOPHEN 10-325 MG PO TABS: 1.0000 | ORAL_TABLET | Freq: Four times a day (QID) | ORAL | Status: DC | PRN

## 2015-07-18 ENCOUNTER — Ambulatory Visit: Payer: Medicare HMO

## 2015-07-20 ENCOUNTER — Other Ambulatory Visit: Payer: Self-pay | Admitting: Family Medicine

## 2015-07-20 ENCOUNTER — Other Ambulatory Visit: Payer: Self-pay | Admitting: Sports Medicine

## 2015-07-23 ENCOUNTER — Encounter: Payer: Self-pay | Admitting: Family Medicine

## 2015-07-24 ENCOUNTER — Other Ambulatory Visit: Payer: Self-pay | Admitting: *Deleted

## 2015-07-24 MED ORDER — LEVOTHYROXINE SODIUM 100 MCG PO TABS: 100.0000 ug | ORAL_TABLET | Freq: Every day | ORAL | Status: DC

## 2015-07-24 MED ORDER — LOSARTAN POTASSIUM 100 MG PO TABS: 100.0000 mg | ORAL_TABLET | Freq: Every day | ORAL | Status: DC

## 2015-07-24 NOTE — Telephone Encounter (Signed)
Advised the pharmacy to inform pt she will need labs for additional refills.Krystal Burgess [place]

## 2015-07-30 ENCOUNTER — Ambulatory Visit: Payer: Self-pay | Admitting: Family Medicine

## 2015-08-08 DIAGNOSIS — J449 Chronic obstructive pulmonary disease, unspecified: Secondary | ICD-10-CM | POA: Diagnosis not present

## 2015-08-13 ENCOUNTER — Other Ambulatory Visit: Payer: Self-pay | Admitting: Family Medicine

## 2015-08-13 MED ORDER — ALBUTEROL SULFATE HFA 108 (90 BASE) MCG/ACT IN AERS: INHALATION_SPRAY | RESPIRATORY_TRACT | Status: DC

## 2015-08-15 ENCOUNTER — Other Ambulatory Visit: Payer: Self-pay

## 2015-08-15 ENCOUNTER — Other Ambulatory Visit: Payer: Self-pay | Admitting: *Deleted

## 2015-08-15 ENCOUNTER — Other Ambulatory Visit: Payer: Self-pay | Admitting: Family Medicine

## 2015-08-15 MED ORDER — HYDROCODONE-ACETAMINOPHEN 10-325 MG PO TABS: 1.0000 | ORAL_TABLET | Freq: Four times a day (QID) | ORAL | Status: DC | PRN

## 2015-08-15 NOTE — Telephone Encounter (Signed)
Due to lack of printer communication with computer, Rx was reprinted.

## 2015-08-18 ENCOUNTER — Other Ambulatory Visit: Payer: Self-pay | Admitting: Family Medicine

## 2015-08-20 ENCOUNTER — Telehealth: Payer: Self-pay

## 2015-08-20 ENCOUNTER — Other Ambulatory Visit: Payer: Self-pay | Admitting: Sports Medicine

## 2015-08-20 ENCOUNTER — Other Ambulatory Visit: Payer: Self-pay

## 2015-08-20 MED ORDER — PREDNISONE 5 MG PO TABS: 5.0000 mg | ORAL_TABLET | Freq: Every day | ORAL | Status: DC

## 2015-08-20 MED ORDER — PREDNISOLONE 5 MG PO TABS: 5.0000 mg | ORAL_TABLET | Freq: Every day | ORAL | Status: DC

## 2015-08-20 NOTE — Telephone Encounter (Signed)
Sent refill.  We had temporarily put her on a burst and so had taken old one off med list.

## 2015-08-20 NOTE — Telephone Encounter (Signed)
Krystal Burgess left a message asking for a refill on prednisone 5 mg maintenance dose. The prednisone 5 mg is not on her medication list. Should it be?

## 2015-08-30 ENCOUNTER — Other Ambulatory Visit: Payer: Self-pay | Admitting: Family Medicine

## 2015-08-30 ENCOUNTER — Other Ambulatory Visit: Payer: Self-pay | Admitting: Sports Medicine

## 2015-09-08 DIAGNOSIS — J449 Chronic obstructive pulmonary disease, unspecified: Secondary | ICD-10-CM | POA: Diagnosis not present

## 2015-09-10 ENCOUNTER — Other Ambulatory Visit: Payer: Self-pay | Admitting: Family Medicine

## 2015-09-29 ENCOUNTER — Other Ambulatory Visit: Payer: Self-pay | Admitting: Family Medicine

## 2015-10-01 ENCOUNTER — Other Ambulatory Visit: Payer: Self-pay | Admitting: *Deleted

## 2015-10-01 MED ORDER — HYDROCODONE-ACETAMINOPHEN 10-325 MG PO TABS: 1.0000 | ORAL_TABLET | Freq: Four times a day (QID) | ORAL | Status: DC | PRN

## 2015-10-04 ENCOUNTER — Other Ambulatory Visit: Payer: Self-pay | Admitting: *Deleted

## 2015-10-04 MED ORDER — ALPRAZOLAM 1 MG PO TABS: 1.0000 mg | ORAL_TABLET | Freq: Three times a day (TID) | ORAL | Status: DC

## 2015-10-08 DIAGNOSIS — J449 Chronic obstructive pulmonary disease, unspecified: Secondary | ICD-10-CM | POA: Diagnosis not present

## 2015-11-01 ENCOUNTER — Ambulatory Visit (INDEPENDENT_AMBULATORY_CARE_PROVIDER_SITE_OTHER): Payer: Commercial Managed Care - HMO | Admitting: Family Medicine

## 2015-11-01 ENCOUNTER — Encounter: Payer: Self-pay | Admitting: Family Medicine

## 2015-11-01 VITALS — BP 133/72 | HR 86 | Temp 98.3°F | Ht 61.0 in | Wt 245.2 lb

## 2015-11-01 DIAGNOSIS — R790 Abnormal level of blood mineral: Secondary | ICD-10-CM

## 2015-11-01 DIAGNOSIS — E039 Hypothyroidism, unspecified: Secondary | ICD-10-CM | POA: Diagnosis not present

## 2015-11-01 DIAGNOSIS — M4806 Spinal stenosis, lumbar region: Secondary | ICD-10-CM | POA: Diagnosis not present

## 2015-11-01 DIAGNOSIS — E119 Type 2 diabetes mellitus without complications: Secondary | ICD-10-CM | POA: Diagnosis not present

## 2015-11-01 DIAGNOSIS — Z780 Asymptomatic menopausal state: Secondary | ICD-10-CM | POA: Diagnosis not present

## 2015-11-01 DIAGNOSIS — Z23 Encounter for immunization: Secondary | ICD-10-CM | POA: Diagnosis not present

## 2015-11-01 DIAGNOSIS — Z114 Encounter for screening for human immunodeficiency virus [HIV]: Secondary | ICD-10-CM | POA: Diagnosis not present

## 2015-11-01 DIAGNOSIS — I10 Essential (primary) hypertension: Secondary | ICD-10-CM | POA: Diagnosis not present

## 2015-11-01 DIAGNOSIS — E538 Deficiency of other specified B group vitamins: Secondary | ICD-10-CM

## 2015-11-01 DIAGNOSIS — Z1159 Encounter for screening for other viral diseases: Secondary | ICD-10-CM | POA: Diagnosis not present

## 2015-11-01 DIAGNOSIS — M48061 Spinal stenosis, lumbar region without neurogenic claudication: Secondary | ICD-10-CM

## 2015-11-01 MED ORDER — LOSARTAN POTASSIUM 100 MG PO TABS: 100.0000 mg | ORAL_TABLET | Freq: Every day | ORAL | Status: DC

## 2015-11-01 MED ORDER — HYDROCODONE-ACETAMINOPHEN 10-325 MG PO TABS: 1.0000 | ORAL_TABLET | Freq: Four times a day (QID) | ORAL | Status: DC | PRN

## 2015-11-01 MED ORDER — ALPRAZOLAM 1 MG PO TABS: 1.0000 mg | ORAL_TABLET | Freq: Three times a day (TID) | ORAL | Status: DC

## 2015-11-01 MED ORDER — PREDNISONE 5 MG PO TABS: 5.0000 mg | ORAL_TABLET | Freq: Every day | ORAL | Status: DC

## 2015-11-01 MED ORDER — ROSUVASTATIN CALCIUM 20 MG PO TABS: 20.0000 mg | ORAL_TABLET | Freq: Every day | ORAL | Status: DC

## 2015-11-01 MED ORDER — PROMETHAZINE HCL 25 MG PO TABS: ORAL_TABLET | ORAL | Status: DC

## 2015-11-01 MED ORDER — LEVOFLOXACIN 500 MG PO TABS: 500.0000 mg | ORAL_TABLET | Freq: Every day | ORAL | Status: AC

## 2015-11-01 MED ORDER — THEOPHYLLINE ER 300 MG PO TB12: 300.0000 mg | ORAL_TABLET | Freq: Two times a day (BID) | ORAL | Status: DC

## 2015-11-01 MED ORDER — LEVOTHYROXINE SODIUM 100 MCG PO TABS: 100.0000 ug | ORAL_TABLET | Freq: Every day | ORAL | Status: DC

## 2015-11-01 MED ORDER — ZOLPIDEM TARTRATE 10 MG PO TABS: 10.0000 mg | ORAL_TABLET | Freq: Every evening | ORAL | Status: DC | PRN

## 2015-11-01 NOTE — Progress Notes (Signed)
Subjective:    Patient ID: Krystal Burgess, female    DOB: 05-Oct-1961, 54 y.o.   MRN: [id number]  HPI Diabetes - no hypoglycemic events. No wounds or sores that are not healing well. No increased thirst or urination. Checking glucose at home. Taking medications as prescribed without any side effects.   COPD - says she can't afford to see a specialist for her lungs.  So not going.  Had to double up on her prednisone and only has 10 tabs left bc she was "sick".    Spinal stenosis - would like refill on her hydrocodone.  Lyrica needs to be rewritten as 100mg  tab.    Hypothyroid -no recent changes in weight or energy level.  Feels like her throat and sinuses have been full. She's had a lot of pressure over her facial cheeks. This is been going on for several weeks. She's had had intermittent productive cough. No fevers chills or sweats. Her ears have been hurting as well. Mild sore throat.  Review of Systems  BP 133/72 mmHg  Pulse 86  Temp(Src) 98.3 F (36.8 C)  Ht 5\' 1"  (1.549 m)  Wt 245 lb 3.2 oz (111.222 kg)  BMI 46.35 kg/m2  SpO2 96%    Allergies  Allergen Reactions  . Aripiprazole     REACTION: muscle jerks  . Augmentin [Amoxicillin-Pot Clavulanate] Other (See Comments)    Gets yeast infection every time   . Fluoxetine Hcl     REACTION: Intolerant  . Geodon [Ziprasidone Hcl] Other (See Comments)    shakes  . Metformin And Related Nausea Only  . Nsaids Other (See Comments)    Upper GI bleed  . Onglyza [Saxagliptin] Nausea Only  . Paroxetine     REACTION: Intolerance    Past Medical History  Diagnosis Date  . Allergy   . Hyperlipidemia   . Thyroid disease   . Pain     Past Surgical History  Procedure Laterality Date  . Cholecystectomy    . Abdominal hysterectomy    . Lipoma removal      RT shoulder  . Pilonydal cyst  1994  . Cesarean section    . Lumbar spine surgery  2013    Dr. Manson Passey, L4-5    Social History   Social History  . Marital Status:  Single    Spouse Name: N/A  . Number of Children: N/A  . Years of Education: N/A   Occupational History  . Not on file.   Social History Main Topics  . Smoking status: Current Every Day Smoker -- 2.50 packs/day  . Smokeless tobacco: Not on file  . Alcohol Use: Not on file  . Drug Use: No  . Sexual Activity: Not on file     Comment: s with mother currentlynemployed, on disabilty, divorced, liver   Other Topics Concern  . Not on file   Social History Narrative    Family History  Problem Relation Age of Onset  . Diabetes Brother   . Depression Brother     bipolar  . Cancer Brother     throat  . Diabetes      grandmother  . Depression Mother   . Hyperlipidemia Mother   . Hypertension Mother   . Hypertension Father     Outpatient Encounter Prescriptions as of 11/01/2015  Medication Sig  . albuterol (PROAIR HFA) 108 (90 BASE) MCG/ACT inhaler INHALE 2 PUFFS INTO THE LUNGS EVERY 4 HOUR AS NEEDED FOR WHEEZING  . ALPRAZolam Prudy Feeler)  1 MG tablet Take 1 tablet (1 mg total) by mouth 3 (three) times daily. Needs appt specifically for depression/anxiety. No future refills  . BREO ELLIPTA 100-25 MCG/INH AEPB INHALE 1 PUFF INTO THE LUNGS DAILY.  . brimonidine-timolol (COMBIGAN) 0.2-0.5 % ophthalmic solution Place 2 drops into both eyes daily.  Marland Kitchen DALIRESP 500 MCG TABS tablet TAKE 1 TABLET (500 MCG TOTAL) BY MOUTH DAILY.  Marland Kitchen diclofenac sodium (VOLTAREN) 1 % GEL Apply 2-4 g topically 4 (four) times daily.  . DULoxetine (CYMBALTA) 30 MG capsule Take 1 capsule (30 mg total) by mouth daily.  . DULoxetine (CYMBALTA) 60 MG capsule Take 1 capsule (60 mg total) by mouth daily.  . fexofenadine (ALLEGRA) 180 MG tablet Take 1 tablet (180 mg total) by mouth daily.  . fluticasone (FLONASE) 50 MCG/ACT nasal spray PUT 2 SPRAYS INTO EACH NOSTRIL DAILY.  . furosemide (LASIX) 20 MG tablet TAKE 1 TO 3 TABLETS BY MOUTH EVERY DAY AS NEEDED AS DIRECTED  . HYDROcodone-acetaminophen (NORCO) 10-325 MG tablet  Take 1 tablet by mouth every 6 (six) hours as needed. Need f/u appt  . ibuprofen (ADVIL,MOTRIN) 800 MG tablet TAKE 1 TABLET BY MOUTH EVERY 8 HOURS AS NEEDED FOR PAIN  . ipratropium-albuterol (DUONEB) 0.5-2.5 (3) MG/3ML SOLN USE 1 VIAL IN NEBULIZER EVERY 2-3 HOURS.  Marland Kitchen levofloxacin (LEVAQUIN) 500 MG tablet Take 1 tablet (500 mg total) by mouth daily.  Marland Kitchen levothyroxine (SYNTHROID, LEVOTHROID) 100 MCG tablet Take 1 tablet (100 mcg total) by mouth daily. Patient needs to schedule a follow up appointment before more refills.  Marland Kitchen losartan (COZAAR) 100 MG tablet Take 1 tablet (100 mg total) by mouth daily. Patient needs to schedule a follow up appointment before more refills.  . meloxicam (MOBIC) 15 MG tablet   . methocarbamol (ROBAXIN) 500 MG tablet TAKE 1 TABLET (500 MG TOTAL) BY MOUTH 3 (THREE) TIMES DAILY AS NEEDED.  Marland Kitchen nystatin (MYCOSTATIN) 100000 UNIT/ML suspension TAKE 1 TEASPOONFUL BY MOUTH 4 TIMES A DAY  . Olopatadine HCl 0.2 % SOLN Apply 1 drop to eye daily.  Marland Kitchen omeprazole (PRILOSEC) 20 MG capsule TAKE ONE CAPSULE TWICE A DAY  . potassium chloride (KLOR-CON) 10 MEQ CR tablet Take 10 mEq by mouth as needed.   . predniSONE (DELTASONE) 5 MG tablet Take 1 tablet (5 mg total) by mouth daily with breakfast.  . pregabalin (LYRICA) 50 MG capsule Take 1 capsule (50 mg total) by mouth 3 (three) times daily. Ok to increase to  TID after one week.  . promethazine (PHENERGAN) 25 MG tablet TAKE 1 TABLET (25 MG TOTAL) BY MOUTH EVERY 8 (EIGHT) HOURS AS NEEDED FOR NAUSEA OR VOMITING.  . QUEtiapine (SEROQUEL) 200 MG tablet TAKE 1 TABLET (200 MG TOTAL) BY MOUTH 2 (TWO) TIMES DAILY.  . roflumilast (DALIRESP) 500 MCG TABS tablet TAKE 1 TABLET (500 MCG TOTAL) BY MOUTH DAILY.  . rosuvastatin (CRESTOR) 20 MG tablet Take 1 tablet (20 mg total) by mouth daily. Due for follow up.  . SPIRIVA HANDIHALER 18 MCG inhalation capsule 1 PUFF EVERY DAY  . theophylline (THEODUR) 300 MG 12 hr tablet Take 1 tablet (300 mg total) by  mouth 2 (two) times daily.  . traMADol (ULTRAM) 50 MG tablet Take 1 tablet (50 mg total) by mouth 2 (two) times daily as needed for pain.  . Travoprost, BAK Free, (TRAVATAN) 0.004 % SOLN ophthalmic solution 1 drop at bedtime.  Marland Kitchen zolpidem (AMBIEN) 10 MG tablet TAKE 1 TABLET BY MOUTH AT BEDTIME AS NEEDED  . [  DISCONTINUED] AMBULATORY NON FORMULARY MEDICATION Glucometer of patient choice.  Diagnosis: Diabetes  Patient testing 2 times a day  . [DISCONTINUED] AMBULATORY NON FORMULARY MEDICATION Medication Name: needles and syringes for b12 injection Dx pernicious anemia  . [DISCONTINUED] AMBULATORY NON FORMULARY MEDICATION Medication Name: Needs portable concentrator for oxygen and a portable nebulizer.  Dx. Of Asthma/COPD. 496, 493.10.  Fax to [place] [phone]  . [DISCONTINUED] AMBULATORY NON FORMULARY MEDICATION Medication Name: Needs portable concentrator for oxygen and a portable nebulizer.  Dx. Of Asthma/COPD. 496, 493.10.  Fax to [employer]. [phone]  . [DISCONTINUED] AMBULATORY NON FORMULARY MEDICATION Medication Name: Acucheck Aviva strips and lancets. Test three times daily.  Dx: Diabetes ICD-10 E13.8  . [DISCONTINUED] AMBULATORY NON FORMULARY MEDICATION Medication Name:  oxygen concentrator that is battery operated.  And the portable oxygen that is battery operated.  Please fax to [employer]  . [DISCONTINUED] AMBULATORY NON FORMULARY MEDICATION Medication Name: manual wheelchair.  . [DISCONTINUED] AMBULATORY NON FORMULARY MEDICATION Test strips and lancets for the Accuc[employer]k Avivva glucometer.  . [DISCONTINUED] CRESTOR 20 MG tablet TAKE 1 TABLET EVERY DAY  . [DISCONTINUED] HYDROcodone-acetaminophen (NORCO) 10-325 MG tablet Take 1 tablet by mouth every 6 (six) hours as needed. Need f/u appt  . [DISCONTINUED] ipratropium-albuterol (DUONEB) 0.5-2.5 (3) MG/3ML SOLN USE 1 VIAL IN NEBULIZER EVERY 2-3 HOURS.  . [DISCONTINUED] latanoprost (XALATAN) 0.005 % ophthalmic solution Place 1 drop into both eyes at  bedtime.  . [DISCONTINUED] predniSONE (DELTASONE) 5 MG tablet Take 1 tablet (5 mg total) by mouth daily with breakfast.  . [DISCONTINUED] SPIRIVA HANDIHALER 18 MCG inhalation capsule 1 PUFF EVERY DAY  . [DISCONTINUED] theophylline (THEODUR) 300 MG 12 hr tablet TAKE 1/2 TAB TWICE A DAY  . [DISCONTINUED] varenicline (CHANTIX STARTING MONTH PAK) 0.5 MG X 11 & 1 MG X 42 tablet Take one 0.5 mg tablet by mouth once daily for 3 days, then increase to one 0.5 mg tablet twice daily for 4 days, then increase to one 1 mg tablet twice daily.   No facility-administered encounter medications on file as of 11/01/2015.          Objective:   Physical Exam  Constitutional: She is oriented to person, place, and time. She appears well-developed and well-nourished.  HENT:  Head: Normocephalic and atraumatic.  Right Ear: External ear normal.  Left Ear: External ear normal.  Nose: Nose normal.  Mouth/Throat: Oropharynx is clear and moist.  TMs and canals are clear.   Eyes: Conjunctivae and EOM are normal. Pupils are equal, round, and reactive to light.  Neck: Neck supple. No thyromegaly present.  Cardiovascular: Normal rate, regular rhythm and normal heart sounds.   Pulmonary/Chest: Effort normal and breath sounds normal. She has no wheezes.  Lymphadenopathy:    She has no cervical adenopathy.  Neurological: She is alert and oriented to person, place, and time.  Skin: Skin is warm and dry.  Psychiatric: She has a normal mood and affect.          Assessment & Plan:  DM-  A!C was 6.0 today.  Well contrlled.  Doing well.  Follow-up in 3-4 months.  COPD - stable. She says she has had a couple of exacerbations but stay at home because she did not have transportation to get here. She would like me to refill the low-dose prednisone that she takes daily. Him still prefer that she see a specialist for this and use the medication minimally. Right now until she gets a vehicle this is going to be very  difficult for her. We did refill her medications today.  Spinal stenosis lumbar spine - I did refill her hydrocodone today. She is okay with referral to pain management for injections. This is been really helpful for her in the past.  Acute bilateral sinusitis-will treat with Levaquin. Call if not significantly better in one week.  Hypothyroidism-due to recheck thyroid level.

## 2015-11-02 LAB — TSH: TSH: 2.727 u[IU]/mL (ref 0.350–4.500)

## 2015-11-02 LAB — LIPID PANEL
CHOL/HDL RATIO: 3.6 ratio (ref <=5.0)
CHOLESTEROL: 156 mg/dL (ref 125–200)
HDL: 43 mg/dL — AB (ref >=46)
LDL Cholesterol: 68 mg/dL (ref <130)
Triglycerides: 227 mg/dL — ABNORMAL HIGH (ref <150)
VLDL: 45 mg/dL — AB (ref <30)

## 2015-11-02 LAB — HIV ANTIBODY (ROUTINE TESTING W REFLEX): HIV 1&2 Ab, 4th Generation: NONREACTIVE

## 2015-11-02 LAB — PROGESTERONE

## 2015-11-02 LAB — COMPLETE METABOLIC PANEL WITH GFR
ALT: 6 U/L (ref 6–29)
AST: 11 U/L (ref 10–35)
Albumin: 3.7 g/dL (ref 3.6–5.1)
Alkaline Phosphatase: 123 U/L (ref 33–130)
BUN: 10 mg/dL (ref 7–25)
CALCIUM: 9.1 mg/dL (ref 8.6–10.4)
CHLORIDE: 92 mmol/L — AB (ref 98–110)
CO2: 29 mmol/L (ref 20–31)
Creat: 1.17 mg/dL — ABNORMAL HIGH (ref 0.50–1.05)
GFR, Est African American: 62 mL/min (ref >=60)
GFR, Est Non African American: 54 mL/min — ABNORMAL LOW (ref >=60)
Glucose, Bld: 84 mg/dL (ref 65–99)
POTASSIUM: 4.5 mmol/L (ref 3.5–5.3)
SODIUM: 130 mmol/L — AB (ref 135–146)
Total Bilirubin: 0.3 mg/dL (ref 0.2–1.2)
Total Protein: 6.3 g/dL (ref 6.1–8.1)

## 2015-11-02 LAB — MAGNESIUM: MAGNESIUM: 1.7 mg/dL (ref 1.5–2.5)

## 2015-11-02 LAB — HEPATITIS C ANTIBODY: HCV AB: NEGATIVE

## 2015-11-02 LAB — VITAMIN B12: VITAMIN B 12: 345 pg/mL (ref 211–911)

## 2015-11-02 LAB — LUTEINIZING HORMONE: LH: 13.2 m[IU]/mL

## 2015-11-05 ENCOUNTER — Telehealth: Payer: Self-pay

## 2015-11-05 DIAGNOSIS — Z0100 Encounter for examination of eyes and vision without abnormal findings: Principal | ICD-10-CM

## 2015-11-05 DIAGNOSIS — E119 Type 2 diabetes mellitus without complications: Secondary | ICD-10-CM

## 2015-11-05 LAB — ACTH: C206 ACTH: 6 pg/mL (ref 6–50)

## 2015-11-05 MED ORDER — MELOXICAM 15 MG PO TABS: 15.0000 mg | ORAL_TABLET | Freq: Every day | ORAL | Status: DC

## 2015-11-05 NOTE — Telephone Encounter (Signed)
Referral sent 

## 2015-11-08 DIAGNOSIS — J449 Chronic obstructive pulmonary disease, unspecified: Secondary | ICD-10-CM | POA: Diagnosis not present

## 2015-11-29 ENCOUNTER — Other Ambulatory Visit: Payer: Self-pay | Admitting: Family Medicine

## 2015-12-01 ENCOUNTER — Other Ambulatory Visit: Payer: Self-pay | Admitting: Family Medicine

## 2015-12-02 ENCOUNTER — Other Ambulatory Visit: Payer: Self-pay | Admitting: Family Medicine

## 2015-12-08 DIAGNOSIS — J449 Chronic obstructive pulmonary disease, unspecified: Secondary | ICD-10-CM | POA: Diagnosis not present

## 2015-12-21 ENCOUNTER — Encounter: Payer: Self-pay | Admitting: Family Medicine

## 2015-12-21 ENCOUNTER — Ambulatory Visit (INDEPENDENT_AMBULATORY_CARE_PROVIDER_SITE_OTHER): Payer: Commercial Managed Care - HMO | Admitting: Family Medicine

## 2015-12-21 DIAGNOSIS — K130 Diseases of lips: Secondary | ICD-10-CM | POA: Diagnosis not present

## 2015-12-21 DIAGNOSIS — B372 Candidiasis of skin and nail: Secondary | ICD-10-CM | POA: Diagnosis not present

## 2015-12-21 DIAGNOSIS — J019 Acute sinusitis, unspecified: Secondary | ICD-10-CM

## 2015-12-21 DIAGNOSIS — M4806 Spinal stenosis, lumbar region: Secondary | ICD-10-CM | POA: Diagnosis not present

## 2015-12-21 DIAGNOSIS — R1013 Epigastric pain: Secondary | ICD-10-CM

## 2015-12-21 DIAGNOSIS — G8929 Other chronic pain: Secondary | ICD-10-CM

## 2015-12-21 DIAGNOSIS — M48061 Spinal stenosis, lumbar region without neurogenic claudication: Secondary | ICD-10-CM

## 2015-12-21 DIAGNOSIS — Z800 Family history of malignant neoplasm of digestive organs: Secondary | ICD-10-CM

## 2015-12-21 MED ORDER — HYDROCODONE-ACETAMINOPHEN 10-325 MG PO TABS: 1.0000 | ORAL_TABLET | Freq: Four times a day (QID) | ORAL | Status: DC | PRN

## 2015-12-21 MED ORDER — AZITHROMYCIN 250 MG PO TABS: ORAL_TABLET | ORAL | Status: AC

## 2015-12-21 MED ORDER — PREGABALIN 100 MG PO CAPS: 100.0000 mg | ORAL_CAPSULE | Freq: Two times a day (BID) | ORAL | Status: DC

## 2015-12-21 MED ORDER — FLUCONAZOLE 150 MG PO TABS: 150.0000 mg | ORAL_TABLET | ORAL | Status: DC

## 2015-12-21 MED ORDER — QUETIAPINE FUMARATE 300 MG PO TABS: 300.0000 mg | ORAL_TABLET | Freq: Two times a day (BID) | ORAL | Status: DC

## 2015-12-21 MED ORDER — ROFLUMILAST 500 MCG PO TABS: ORAL_TABLET | ORAL | Status: DC

## 2015-12-21 NOTE — Progress Notes (Signed)
   Subjective:    Patient ID: Krystal Burgess, female    DOB: [birth year], 54 y.o.   MRN: [id number]  HPI Here to follow-up for pain management for spinal stenosis. We have referred her to pain management but she said she was never contacted but then just got a letter in the mail this weeks and they have been trying to contact her. She would prefer to be seen in Dr. Migdalia Dk office and try pain management. She would like a new referral placed.  Had endoscopy a year ago and has been having epigastric pain since then.  She occasionally gets nauseated as well. She is concerned because her mother was just diagnosed with pancreatic cancer and they have caught in hospice.  She also complains of a rash on the creases of her mouth. It just doesn't seem to go away even if she moisturizes it. She's also had a rash in the groin creases and under the fold in the pannus. She says it gets very sore and tender at times. She sometimes gets abscesses and bumps.  She thinks she may have a sinus infection. She's had significant nasal congestion and pressure for the last week. With green and yellow nasal discharge. Her left nostril started bleeding this morning. She's been having some hot sweats and chills but has not checked her temperature. No sore throat. She has had an increase and cough. She does have asthma COPD but has increased her prednisone for a couple days to get that under control and feels like that has helped.     Review of Systems     Objective:   Physical Exam  Constitutional: She is oriented to person, place, and time. She appears well-developed and well-nourished.  HENT:  Head: Normocephalic and atraumatic.  Right Ear: External ear normal.  Left Ear: External ear normal.  Nose: Nose normal.  Mouth/Throat: Oropharynx is clear and moist.  TMs and canals are clear. Some dried blood in the left nostril.  Eyes: Conjunctivae and EOM are normal. Pupils are equal, round, and reactive to light.   Neck: Neck supple. No thyromegaly present.  Cardiovascular: Normal rate, regular rhythm and normal heart sounds.   Pulmonary/Chest: Effort normal. She has wheezes.  Slight expiratory wheeze at the bases bilaterally. no crackles.  Lymphadenopathy:    She has no cervical adenopathy.  Neurological: She is alert and oriented to person, place, and time.  Skin: Skin is warm and dry.  Psychiatric: She has a normal mood and affect.          Assessment & Plan:  Acute sinusitis-we'll treat with azithromycin. She started on chronic prednisone.  Spinal stenosis of lumbar spine-we'll place new referral for pain management. Medical hemithorax could on today.  Angular chelitis-typically I would treat this with a topical but since she also has what looks like candidiasis in the groin area going to treat with oral Diflucan.  Candidiasis, skin-we'll treat with oral Diflucan. Try to keep the area dry as possible.  Epigastric pain-we'll evaluate for inflammation of the pancreas.   COPD-stable. Some slight wheezing at the bases bilaterally. But this is actually a good exam for her compared to when she usually comes in.

## 2015-12-22 ENCOUNTER — Other Ambulatory Visit: Payer: Self-pay | Admitting: Family Medicine

## 2015-12-25 ENCOUNTER — Other Ambulatory Visit: Payer: Self-pay

## 2015-12-25 ENCOUNTER — Telehealth: Payer: Self-pay | Admitting: Family Medicine

## 2015-12-25 MED ORDER — OLOPATADINE HCL 0.2 % OP SOLN: 1.0000 [drp] | Freq: Every day | OPHTHALMIC | Status: DC

## 2015-12-25 NOTE — Telephone Encounter (Signed)
Called [facility] and spoke with Weyman Croon. Pt's insurance will not cover name brand Voltaren gel so we are checking on compounded 2% version made at the pharmacy. Weyman Croon is going to call back with pricing for 30 day supply and what size (in grams) that would be.

## 2015-12-27 ENCOUNTER — Telehealth: Payer: Self-pay

## 2015-12-27 NOTE — Telephone Encounter (Signed)
Unable to reach patient. Wrong number.

## 2015-12-27 NOTE — Telephone Encounter (Signed)
Krystal Burgess called and states she feels drunk all the time. She states the lyrica and seroquel maybe to strong to start. She would like a lower dose so she can do a step therapy. Please advise.

## 2015-12-27 NOTE — Telephone Encounter (Signed)
Let's have her go back down on the Seroquel. I think she was on 200mg  twice a day before.

## 2016-01-01 ENCOUNTER — Telehealth: Payer: Self-pay

## 2016-01-01 DIAGNOSIS — Z800 Family history of malignant neoplasm of digestive organs: Secondary | ICD-10-CM

## 2016-01-01 DIAGNOSIS — R1013 Epigastric pain: Secondary | ICD-10-CM

## 2016-01-01 NOTE — Telephone Encounter (Signed)
Ok to change to with contrast

## 2016-01-01 NOTE — Telephone Encounter (Signed)
Left message for a return call

## 2016-01-01 NOTE — Telephone Encounter (Signed)
Imaging department called and reports the CT should be with contrast if looking for cancer. Please advise.

## 2016-01-01 NOTE — Telephone Encounter (Signed)
Raiyah advised to go to the lab for a BMP. Gave phone number to call to schedule CT.

## 2016-01-01 NOTE — Telephone Encounter (Signed)
Patient needs labs drawn before the CT scan. Unable to reach patient. I did leave a message on one of the numbers we have listed.

## 2016-01-01 NOTE — Telephone Encounter (Signed)
Krystal Burgess is taking Seroquel 200 mg bid and Lyrica 50 mg bid. Advised to take the Lyrica 100 mg bid as prescribed.

## 2016-01-03 ENCOUNTER — Other Ambulatory Visit: Payer: Self-pay | Admitting: Family Medicine

## 2016-01-08 DIAGNOSIS — J449 Chronic obstructive pulmonary disease, unspecified: Secondary | ICD-10-CM | POA: Diagnosis not present

## 2016-01-15 ENCOUNTER — Other Ambulatory Visit: Payer: Self-pay | Admitting: Family Medicine

## 2016-01-21 ENCOUNTER — Other Ambulatory Visit: Payer: Self-pay | Admitting: Family Medicine

## 2016-02-03 ENCOUNTER — Other Ambulatory Visit: Payer: Self-pay | Admitting: Family Medicine

## 2016-02-07 ENCOUNTER — Other Ambulatory Visit: Payer: Self-pay

## 2016-02-07 ENCOUNTER — Other Ambulatory Visit: Payer: Self-pay | Admitting: Family Medicine

## 2016-02-07 DIAGNOSIS — M48061 Spinal stenosis, lumbar region without neurogenic claudication: Secondary | ICD-10-CM

## 2016-02-07 MED ORDER — HYDROCODONE-ACETAMINOPHEN 10-325 MG PO TABS: 1.0000 | ORAL_TABLET | Freq: Four times a day (QID) | ORAL | Status: DC | PRN

## 2016-02-08 DIAGNOSIS — J449 Chronic obstructive pulmonary disease, unspecified: Secondary | ICD-10-CM | POA: Diagnosis not present

## 2016-02-11 ENCOUNTER — Other Ambulatory Visit: Payer: Self-pay | Admitting: Family Medicine

## 2016-02-11 ENCOUNTER — Ambulatory Visit: Payer: Self-pay | Admitting: Family Medicine

## 2016-02-12 ENCOUNTER — Encounter: Payer: Self-pay | Admitting: Family Medicine

## 2016-02-12 ENCOUNTER — Ambulatory Visit (INDEPENDENT_AMBULATORY_CARE_PROVIDER_SITE_OTHER): Payer: Commercial Managed Care - HMO | Admitting: Family Medicine

## 2016-02-12 VITALS — BP 157/80 | HR 96 | Wt 251.0 lb

## 2016-02-12 DIAGNOSIS — Z8719 Personal history of other diseases of the digestive system: Secondary | ICD-10-CM

## 2016-02-12 DIAGNOSIS — H40241 Residual stage of angle-closure glaucoma, right eye: Secondary | ICD-10-CM | POA: Diagnosis not present

## 2016-02-12 DIAGNOSIS — E119 Type 2 diabetes mellitus without complications: Secondary | ICD-10-CM

## 2016-02-12 DIAGNOSIS — H52223 Regular astigmatism, bilateral: Secondary | ICD-10-CM | POA: Diagnosis not present

## 2016-02-12 DIAGNOSIS — J0110 Acute frontal sinusitis, unspecified: Secondary | ICD-10-CM

## 2016-02-12 DIAGNOSIS — B372 Candidiasis of skin and nail: Secondary | ICD-10-CM

## 2016-02-12 DIAGNOSIS — R1013 Epigastric pain: Secondary | ICD-10-CM | POA: Diagnosis not present

## 2016-02-12 DIAGNOSIS — H25813 Combined forms of age-related cataract, bilateral: Secondary | ICD-10-CM | POA: Diagnosis not present

## 2016-02-12 DIAGNOSIS — Z8711 Personal history of peptic ulcer disease: Secondary | ICD-10-CM

## 2016-02-12 DIAGNOSIS — H02831 Dermatochalasis of right upper eyelid: Secondary | ICD-10-CM | POA: Diagnosis not present

## 2016-02-12 DIAGNOSIS — H527 Unspecified disorder of refraction: Secondary | ICD-10-CM | POA: Diagnosis not present

## 2016-02-12 DIAGNOSIS — H01001 Unspecified blepharitis right upper eyelid: Secondary | ICD-10-CM | POA: Diagnosis not present

## 2016-02-12 DIAGNOSIS — Z800 Family history of malignant neoplasm of digestive organs: Secondary | ICD-10-CM | POA: Diagnosis not present

## 2016-02-12 DIAGNOSIS — J209 Acute bronchitis, unspecified: Secondary | ICD-10-CM

## 2016-02-12 DIAGNOSIS — K2100 Gastro-esophageal reflux disease with esophagitis, without bleeding: Secondary | ICD-10-CM

## 2016-02-12 LAB — POCT GLYCOSYLATED HEMOGLOBIN (HGB A1C): HEMOGLOBIN A1C: 5.8

## 2016-02-12 MED ORDER — SUCRALFATE 1 GM/10ML PO SUSP: 1.0000 g | Freq: Three times a day (TID) | ORAL | Status: DC

## 2016-02-12 MED ORDER — PANTOPRAZOLE SODIUM 40 MG PO TBEC: 40.0000 mg | DELAYED_RELEASE_TABLET | Freq: Every day | ORAL | Status: DC

## 2016-02-12 MED ORDER — AZITHROMYCIN 250 MG PO TABS: ORAL_TABLET | ORAL | Status: AC

## 2016-02-12 MED ORDER — GI COCKTAIL ~~LOC~~
30.0000 mL | Freq: Once | ORAL | Status: AC
  Administered 2016-02-12: 30 mL via ORAL

## 2016-02-12 MED ORDER — PREDNISONE 20 MG PO TABS: 40.0000 mg | ORAL_TABLET | Freq: Every day | ORAL | Status: DC

## 2016-02-12 MED ORDER — PROMETHAZINE HCL 25 MG/ML IJ SOLN
25.0000 mg | Freq: Once | INTRAMUSCULAR | Status: AC
  Administered 2016-02-12: 25 mg via INTRAMUSCULAR

## 2016-02-12 MED ORDER — FLUCONAZOLE 150 MG PO TABS: 150.0000 mg | ORAL_TABLET | ORAL | Status: DC

## 2016-02-12 NOTE — Addendum Note (Signed)
Addended by: Deno Etienne on: 02/12/2016 04:23 PM   Modules accepted: Orders

## 2016-02-12 NOTE — Progress Notes (Signed)
   Subjective:    Patient ID: Krystal Burgess, female    DOB: 01-08-1961, 55 y.o.   MRN: [id number]  HPI  GERD with epigastric pain. Has been taking her omeprazole 2-3 times a day.  Says also tried a laxative.  She has a hiatal hernia.  She is still drinking soda.  + fam hx of pancreatic cancer. She has been using her nausea medication. Prior hx of gastric ulcer.    She has been wheezing and more SOB. She feels like she has a sinus infection.  Lot of pressure over the nasal bridge.  Has felt sick since the weekend.  Having hot sweats.  She has been more constipated.    She still had candidiasis under the pannus. Says she is living with her mother who keeps the heat up and she has been more sweaty.    Diabetes - no hypoglycemic events. No wounds or sores that are not healing well. No increased thirst or urination. Checking glucose at home. Taking medications as prescribed without any side effects.   Review of Systems     Objective:   Physical Exam  Constitutional: She is oriented to person, place, and time. She appears well-developed and well-nourished.  HENT:  Head: Normocephalic and atraumatic.  Right Ear: External ear normal.  Left Ear: External ear normal.  Nose: Nose normal.  Mouth/Throat: Oropharynx is clear and moist.  TMs and canals are clear.   Eyes: Conjunctivae and EOM are normal. Pupils are equal, round, and reactive to light.  Neck: Neck supple. No thyromegaly present.  Cardiovascular: Normal rate, regular rhythm and normal heart sounds.   Pulmonary/Chest: Effort normal and breath sounds normal. She has no wheezes.  Lymphadenopathy:    She has no cervical adenopathy.  Neurological: She is alert and oriented to person, place, and time.  Skin: Skin is warm and dry.  Psychiatric: She has a normal mood and affect.          Assessment & Plan:  GERD - given GI cocktail here in the office. We'll switch and upper salt to pantoprazole. We'll also send over Carafate to  cut the stomach before meals and at bedtime. Recommend referral to GI. We'll get labs today with kidney function so that she can go ahead and get a CT scheduled that was ordered over a month ago just to rule out any other abnormalities. There is a positive family history of pancreatic cancer.  Acute sinusitis/bronchitis-we'll treat with azithromycin. 5 day course of prednisone.  candidiasis of pannus - will treat with oral Diflucan since has not responded well to topicals. In the past.  Diabetes-controlled. Continue current regimen. Follow-up in 3 months. Monitor to get up-to-date eye exam. Lab Results  Component Value Date   HGBA1C 5.8 02/12/2016

## 2016-02-13 LAB — BASIC METABOLIC PANEL
BUN: 11 mg/dL (ref 7–25)
CALCIUM: 8.6 mg/dL (ref 8.6–10.4)
CO2: 27 mmol/L (ref 20–31)
CREATININE: 1.32 mg/dL — AB (ref 0.50–1.05)
Chloride: 98 mmol/L (ref 98–110)
GLUCOSE: 101 mg/dL — AB (ref 65–99)
Potassium: 4.9 mmol/L (ref 3.5–5.3)
Sodium: 134 mmol/L — ABNORMAL LOW (ref 135–146)

## 2016-02-15 DIAGNOSIS — K5733 Diverticulitis of large intestine without perforation or abscess with bleeding: Secondary | ICD-10-CM | POA: Diagnosis not present

## 2016-02-15 DIAGNOSIS — K5730 Diverticulosis of large intestine without perforation or abscess without bleeding: Secondary | ICD-10-CM | POA: Diagnosis not present

## 2016-02-15 DIAGNOSIS — R112 Nausea with vomiting, unspecified: Secondary | ICD-10-CM | POA: Diagnosis not present

## 2016-02-15 DIAGNOSIS — R1310 Dysphagia, unspecified: Secondary | ICD-10-CM | POA: Diagnosis not present

## 2016-02-18 ENCOUNTER — Other Ambulatory Visit: Payer: Self-pay | Admitting: Family Medicine

## 2016-02-19 ENCOUNTER — Ambulatory Visit (HOSPITAL_BASED_OUTPATIENT_CLINIC_OR_DEPARTMENT_OTHER): Payer: Commercial Managed Care - HMO

## 2016-02-19 ENCOUNTER — Ambulatory Visit (HOSPITAL_BASED_OUTPATIENT_CLINIC_OR_DEPARTMENT_OTHER): Admission: RE | Admit: 2016-02-19 | Payer: Commercial Managed Care - HMO | Source: Ambulatory Visit

## 2016-02-25 ENCOUNTER — Other Ambulatory Visit: Payer: Self-pay | Admitting: *Deleted

## 2016-02-25 MED ORDER — ROSUVASTATIN CALCIUM 20 MG PO TABS: 20.0000 mg | ORAL_TABLET | Freq: Every day | ORAL | Status: DC

## 2016-02-28 ENCOUNTER — Other Ambulatory Visit: Payer: Self-pay | Admitting: Family Medicine

## 2016-02-28 DIAGNOSIS — M48061 Spinal stenosis, lumbar region without neurogenic claudication: Secondary | ICD-10-CM

## 2016-02-29 ENCOUNTER — Telehealth: Payer: Self-pay

## 2016-02-29 NOTE — Telephone Encounter (Signed)
[facility] called and left a message stating she needs her diabetes medication. She stated she was advised to call back if her blood sugars went up again. She left a message reporting blood sugars in the 150's.

## 2016-03-01 MED ORDER — SITAGLIPTIN PHOSPHATE 100 MG PO TABS
100.0000 mg | ORAL_TABLET | Freq: Every day | ORAL | Status: DC
Start: 2016-03-01 — End: 2016-08-14

## 2016-03-01 NOTE — Telephone Encounter (Signed)
OK, new rx sent.  

## 2016-03-02 ENCOUNTER — Other Ambulatory Visit: Payer: Self-pay | Admitting: Family Medicine

## 2016-03-03 ENCOUNTER — Telehealth: Payer: Self-pay

## 2016-03-03 NOTE — Telephone Encounter (Signed)
Ok to increase to BID. OK to send new rx.

## 2016-03-03 NOTE — Telephone Encounter (Signed)
Notified patient that script was sent

## 2016-03-04 ENCOUNTER — Other Ambulatory Visit: Payer: Self-pay

## 2016-03-04 MED ORDER — PANTOPRAZOLE SODIUM 40 MG PO TBEC: 40.0000 mg | DELAYED_RELEASE_TABLET | Freq: Two times a day (BID) | ORAL | Status: DC

## 2016-03-04 NOTE — Telephone Encounter (Signed)
Same dosage, just twice a day?

## 2016-03-04 NOTE — Telephone Encounter (Signed)
Yes, same dose just BID

## 2016-03-04 NOTE — Telephone Encounter (Signed)
Sent to pharmacy 

## 2016-03-06 ENCOUNTER — Other Ambulatory Visit: Payer: Self-pay | Admitting: Family Medicine

## 2016-03-07 DIAGNOSIS — J449 Chronic obstructive pulmonary disease, unspecified: Secondary | ICD-10-CM | POA: Diagnosis not present

## 2016-03-11 ENCOUNTER — Other Ambulatory Visit: Payer: Self-pay | Admitting: Family Medicine

## 2016-03-12 ENCOUNTER — Encounter: Payer: Self-pay | Admitting: Family Medicine

## 2016-03-12 ENCOUNTER — Ambulatory Visit (INDEPENDENT_AMBULATORY_CARE_PROVIDER_SITE_OTHER): Payer: Commercial Managed Care - HMO | Admitting: Family Medicine

## 2016-03-12 VITALS — BP 144/79 | HR 96 | Wt 251.0 lb

## 2016-03-12 DIAGNOSIS — J449 Chronic obstructive pulmonary disease, unspecified: Secondary | ICD-10-CM

## 2016-03-12 DIAGNOSIS — J0191 Acute recurrent sinusitis, unspecified: Secondary | ICD-10-CM

## 2016-03-12 DIAGNOSIS — J45909 Unspecified asthma, uncomplicated: Secondary | ICD-10-CM

## 2016-03-12 DIAGNOSIS — M48061 Spinal stenosis, lumbar region without neurogenic claudication: Secondary | ICD-10-CM

## 2016-03-12 DIAGNOSIS — M4806 Spinal stenosis, lumbar region: Secondary | ICD-10-CM

## 2016-03-12 DIAGNOSIS — R1013 Epigastric pain: Secondary | ICD-10-CM | POA: Diagnosis not present

## 2016-03-12 MED ORDER — HYDROCODONE-ACETAMINOPHEN 10-325 MG PO TABS: 1.0000 | ORAL_TABLET | Freq: Four times a day (QID) | ORAL | Status: DC | PRN

## 2016-03-12 MED ORDER — TIOTROPIUM BROMIDE MONOHYDRATE 18 MCG IN CAPS: ORAL_CAPSULE | RESPIRATORY_TRACT | Status: DC

## 2016-03-12 MED ORDER — CEFDINIR 300 MG PO CAPS
300.0000 mg | ORAL_CAPSULE | Freq: Two times a day (BID) | ORAL | Status: DC
Start: 2016-03-12 — End: 2016-03-24

## 2016-03-12 MED ORDER — ALBUTEROL SULFATE (2.5 MG/3ML) 0.083% IN NEBU: 2.5000 mg | INHALATION_SOLUTION | Freq: Four times a day (QID) | RESPIRATORY_TRACT | Status: DC | PRN

## 2016-03-12 NOTE — Progress Notes (Signed)
Subjective:    Patient ID: Krystal Burgess, female    DOB: 10/29/1961, 55 y.o.   MRN: [id number]  HPI Patient comes in today complaining of persistent sinus symptoms. I saw her proximally 4 weeks ago for sinusitis and treated her with a course of prednisone and Z-Pak. She is also having some chest symptoms at the time. She says she felt a little better after the Z-Pak but never completely resolved. She still getting green nasal discharge from the left nostril with blood. She doesn't feel as impacted as she did previously. She felt like her symptoms got worse about a week ago after the grass around her house was noted. Yesterday she had some cold chills but no fever.  She had to cancel her skin with GI because she was unable to completely drink the oral contrast. She also has not had the abdominal CT that we ordered back in January but plans to call and reschedule it. She says the Carafate made a big difference in her symptom control. Though she is still drinking soda.  She also wanted to let us know that she is no longer using [facility]. They're having all the prescription switched over to [facility] on [address].  COPD/asthma-she needs refill refills on her albuterol nebulizer solution. She also needs a refill on her Spiriva.  Chronic back pain-she has been referred to pain management. Unfortunately had they had to reschedule her appointment for April. She's asking for refill on her hydrocodone until she gets an appointment.   Review of Systems     Objective:   Physical Exam  Constitutional: She is oriented to person, place, and time. She appears well-developed and well-nourished.  HENT:  Head: Normocephalic and atraumatic.  Right Ear: External ear normal.  Left Ear: External ear normal.  Nose: Nose normal.  Mouth/Throat: Oropharynx is clear and moist.  TMs and canals are clear.   Eyes: Conjunctivae and EOM are normal. Pupils are equal, round, and reactive to light.  Neck: Neck supple. No  thyromegaly present.  Cardiovascular: Normal rate, regular rhythm and normal heart sounds.   Pulmonary/Chest: Effort normal. She has wheezes.  Slight expiratory wheezing at the right and left upper base of the lungs.  Lymphadenopathy:    She has no cervical adenopathy.  Neurological: She is alert and oriented to person, place, and time.  Skin: Skin is warm and dry.  Psychiatric: She has a normal mood and affect. Her behavior is normal.          Assessment & Plan:  Recurrent sinusitis-we'll treat with Omnicef. Call if not significantly better in 10 days. Encouraged her not to wait a month before she comes back in. At this point I'm holding off on prednisone. She does have some mild wheezing on exam but certainly she feels like she's getting worse to give [place] a call before the weekend.  Epigastric pain-did encourage her to reschedule her CT. Also encouraged her to contact GI to reschedule her swallow study. Next  COPD/asthma-refilled her albuterol nebulizer solution as well as her Spiriva.  Chronic back pain-appointment delayed until April 18. Refill provided. This should be enough to get her until her appointment.  Is also concerned about some memory issues. She feels over the last year that she's been more forgetful. She's having to write things down and use a pillbox to help take her medications. She says she will often call her daughter who then tells her that they've Artie had the same conversation. Encouraged  her to schedule further evaluation for memory testing sometime next month.

## 2016-03-14 ENCOUNTER — Other Ambulatory Visit: Payer: Self-pay

## 2016-03-14 MED ORDER — AMBULATORY NON FORMULARY MEDICATION: Status: DC

## 2016-03-14 MED ORDER — GLUCOSE BLOOD VI STRP: ORAL_STRIP | Status: DC

## 2016-03-14 MED ORDER — ACCU-CHEK SOFT TOUCH LANCETS MISC: Status: DC

## 2016-03-17 ENCOUNTER — Telehealth: Payer: Self-pay

## 2016-03-17 DIAGNOSIS — E079 Disorder of thyroid, unspecified: Secondary | ICD-10-CM | POA: Diagnosis not present

## 2016-03-17 DIAGNOSIS — H409 Unspecified glaucoma: Secondary | ICD-10-CM | POA: Diagnosis not present

## 2016-03-17 DIAGNOSIS — J45909 Unspecified asthma, uncomplicated: Secondary | ICD-10-CM | POA: Diagnosis not present

## 2016-03-17 DIAGNOSIS — E119 Type 2 diabetes mellitus without complications: Secondary | ICD-10-CM | POA: Diagnosis not present

## 2016-03-17 DIAGNOSIS — E785 Hyperlipidemia, unspecified: Secondary | ICD-10-CM | POA: Diagnosis not present

## 2016-03-17 DIAGNOSIS — J329 Chronic sinusitis, unspecified: Secondary | ICD-10-CM | POA: Diagnosis not present

## 2016-03-17 DIAGNOSIS — D72829 Elevated white blood cell count, unspecified: Secondary | ICD-10-CM | POA: Diagnosis not present

## 2016-03-17 DIAGNOSIS — I10 Essential (primary) hypertension: Secondary | ICD-10-CM | POA: Diagnosis not present

## 2016-03-17 DIAGNOSIS — F29 Unspecified psychosis not due to a substance or known physiological condition: Secondary | ICD-10-CM | POA: Diagnosis not present

## 2016-03-17 DIAGNOSIS — J449 Chronic obstructive pulmonary disease, unspecified: Secondary | ICD-10-CM | POA: Diagnosis not present

## 2016-03-17 DIAGNOSIS — R442 Other hallucinations: Secondary | ICD-10-CM | POA: Diagnosis not present

## 2016-03-17 DIAGNOSIS — F319 Bipolar disorder, unspecified: Secondary | ICD-10-CM | POA: Diagnosis not present

## 2016-03-17 DIAGNOSIS — F17210 Nicotine dependence, cigarettes, uncomplicated: Secondary | ICD-10-CM | POA: Diagnosis not present

## 2016-03-17 DIAGNOSIS — Z6841 Body Mass Index (BMI) 40.0 and over, adult: Secondary | ICD-10-CM | POA: Diagnosis not present

## 2016-03-17 DIAGNOSIS — R441 Visual hallucinations: Secondary | ICD-10-CM | POA: Diagnosis not present

## 2016-03-17 DIAGNOSIS — R440 Auditory hallucinations: Secondary | ICD-10-CM | POA: Diagnosis not present

## 2016-03-17 DIAGNOSIS — J018 Other acute sinusitis: Secondary | ICD-10-CM | POA: Diagnosis not present

## 2016-03-17 DIAGNOSIS — F329 Major depressive disorder, single episode, unspecified: Secondary | ICD-10-CM | POA: Diagnosis not present

## 2016-03-17 DIAGNOSIS — R51 Headache: Secondary | ICD-10-CM | POA: Diagnosis not present

## 2016-03-17 DIAGNOSIS — R509 Fever, unspecified: Secondary | ICD-10-CM | POA: Diagnosis not present

## 2016-03-17 NOTE — Telephone Encounter (Signed)
Patient states she needs to be admitted into the hospital. She states she is having trouble keeping her oxygen up. She states she hasn't sleep in a day, has allergies and vertigo. She hung the phone up after I told her we may not be able to get her admitted for these reasons. She said "never mind" and hung up the phone.

## 2016-03-18 DIAGNOSIS — I10 Essential (primary) hypertension: Secondary | ICD-10-CM | POA: Diagnosis not present

## 2016-03-18 DIAGNOSIS — J45909 Unspecified asthma, uncomplicated: Secondary | ICD-10-CM | POA: Diagnosis not present

## 2016-03-18 DIAGNOSIS — R441 Visual hallucinations: Secondary | ICD-10-CM | POA: Diagnosis not present

## 2016-03-18 DIAGNOSIS — F39 Unspecified mood [affective] disorder: Secondary | ICD-10-CM | POA: Diagnosis not present

## 2016-03-18 DIAGNOSIS — E669 Obesity, unspecified: Secondary | ICD-10-CM | POA: Diagnosis not present

## 2016-03-18 DIAGNOSIS — E119 Type 2 diabetes mellitus without complications: Secondary | ICD-10-CM | POA: Diagnosis not present

## 2016-03-18 DIAGNOSIS — H409 Unspecified glaucoma: Secondary | ICD-10-CM | POA: Diagnosis not present

## 2016-03-18 DIAGNOSIS — F17210 Nicotine dependence, cigarettes, uncomplicated: Secondary | ICD-10-CM | POA: Diagnosis not present

## 2016-03-18 DIAGNOSIS — J449 Chronic obstructive pulmonary disease, unspecified: Secondary | ICD-10-CM | POA: Diagnosis not present

## 2016-03-18 DIAGNOSIS — F319 Bipolar disorder, unspecified: Secondary | ICD-10-CM | POA: Diagnosis not present

## 2016-03-18 DIAGNOSIS — Z6841 Body Mass Index (BMI) 40.0 and over, adult: Secondary | ICD-10-CM | POA: Diagnosis not present

## 2016-03-18 DIAGNOSIS — F329 Major depressive disorder, single episode, unspecified: Secondary | ICD-10-CM | POA: Diagnosis not present

## 2016-03-18 DIAGNOSIS — J329 Chronic sinusitis, unspecified: Secondary | ICD-10-CM | POA: Diagnosis not present

## 2016-03-18 DIAGNOSIS — Z79899 Other long term (current) drug therapy: Secondary | ICD-10-CM | POA: Diagnosis not present

## 2016-03-18 DIAGNOSIS — E079 Disorder of thyroid, unspecified: Secondary | ICD-10-CM | POA: Diagnosis not present

## 2016-03-18 DIAGNOSIS — Z888 Allergy status to other drugs, medicaments and biological substances status: Secondary | ICD-10-CM | POA: Diagnosis not present

## 2016-03-18 DIAGNOSIS — D72829 Elevated white blood cell count, unspecified: Secondary | ICD-10-CM | POA: Diagnosis not present

## 2016-03-18 DIAGNOSIS — E785 Hyperlipidemia, unspecified: Secondary | ICD-10-CM | POA: Diagnosis not present

## 2016-03-23 DIAGNOSIS — J0191 Acute recurrent sinusitis, unspecified: Secondary | ICD-10-CM | POA: Diagnosis not present

## 2016-03-23 DIAGNOSIS — J449 Chronic obstructive pulmonary disease, unspecified: Secondary | ICD-10-CM | POA: Diagnosis not present

## 2016-03-24 ENCOUNTER — Ambulatory Visit (INDEPENDENT_AMBULATORY_CARE_PROVIDER_SITE_OTHER): Payer: Commercial Managed Care - HMO | Admitting: Family Medicine

## 2016-03-24 ENCOUNTER — Encounter: Payer: Self-pay | Admitting: Family Medicine

## 2016-03-24 VITALS — BP 117/60 | HR 78 | Wt 254.0 lb

## 2016-03-24 DIAGNOSIS — D509 Iron deficiency anemia, unspecified: Secondary | ICD-10-CM | POA: Diagnosis not present

## 2016-03-24 DIAGNOSIS — E119 Type 2 diabetes mellitus without complications: Secondary | ICD-10-CM | POA: Diagnosis not present

## 2016-03-24 DIAGNOSIS — J449 Chronic obstructive pulmonary disease, unspecified: Secondary | ICD-10-CM

## 2016-03-24 DIAGNOSIS — E038 Other specified hypothyroidism: Secondary | ICD-10-CM

## 2016-03-24 DIAGNOSIS — I10 Essential (primary) hypertension: Secondary | ICD-10-CM

## 2016-03-24 DIAGNOSIS — J45909 Unspecified asthma, uncomplicated: Secondary | ICD-10-CM

## 2016-03-24 DIAGNOSIS — E559 Vitamin D deficiency, unspecified: Secondary | ICD-10-CM

## 2016-03-24 MED ORDER — AMLODIPINE BESYLATE 5 MG PO TABS: 5.0000 mg | ORAL_TABLET | Freq: Every day | ORAL | Status: DC

## 2016-03-24 MED ORDER — SODIUM CHLORIDE 0.9 % IN NEBU: 3.0000 mL | INHALATION_SOLUTION | RESPIRATORY_TRACT | Status: DC | PRN

## 2016-03-24 MED ORDER — MELOXICAM 15 MG PO TABS: 15.0000 mg | ORAL_TABLET | Freq: Every day | ORAL | Status: DC

## 2016-03-24 MED ORDER — LEVOTHYROXINE SODIUM 88 MCG PO TABS: 88.0000 ug | ORAL_TABLET | Freq: Every day | ORAL | Status: DC

## 2016-03-24 NOTE — Patient Instructions (Signed)
We are going to start amlodipine to help lower your blood pressure. He will still continue your other blood pressure medications including your losartan in her Lasix.

## 2016-03-24 NOTE — Progress Notes (Signed)
Subjective:    Patient ID: Krystal Burgess, female    DOB: [birth year], 55 y.o.   MRN: [id number]  HPI  55 yo female who was recently seen at the ED and admitted for 2 days.  She was evidently admitted for anxiety and hallucinations. The patient reported that she had not slept in over 36 hours. There are some kids in her neighborhood been throwing rocks and keeping people awake at night as they had not had any adult supervision. She also been getting up checking on her mother to is at the end stages of her cancer. She just felt exhausted and overwhelmed. She was getting short of breath. Her symptoms were felt to be due to sleep deprivation. She was discharged home. They had encouraged that she consider behavioral health referral. They did do some additional blood work they did not adjust her Cymbalta or Seroquel. She has been previously hospitalized for bipolar disorder.  She is actually overall feeling a lot better. She was able to rest for couple days while she was there. She says she is actually lost some weight and says she feels a lot better. She's been really cutting back on soda intake. She still has an occasional soda with a meal but really has been hitting water mostly. Her blood sugars have been running from 82 to the 130 range.  Hypothyroid - she restarted her thyroid medication.   Asthma/COPD-they added saline to her nebulizer treatments and she actually felt like that worked better for her. She would like a prescription for that.   Review of Systems     Objective:   Physical Exam  Constitutional: She is oriented to person, place, and time. She appears well-developed and well-nourished.  HENT:  Head: Normocephalic and atraumatic.  Right Ear: External ear normal.  Left Ear: External ear normal.  Nose: Nose normal.  Mouth/Throat: Oropharynx is clear and moist.  TMs and canals are clear.   Eyes: Conjunctivae and EOM are normal. Pupils are equal, round, and reactive to light.   Neck: Neck supple. No thyromegaly present.  Cardiovascular: Normal rate, regular rhythm and normal heart sounds.   Pulmonary/Chest: Effort normal. She has wheezes.  Slight expiratory wheeze at the left upper lung   Lymphadenopathy:    She has no cervical adenopathy.  Neurological: She is alert and oriented to person, place, and time.  Skin: Skin is warm and dry.  Psychiatric: She has a normal mood and affect.          Assessment & Plan:  HTN - BP have been running higher last few months. .  When I calcium channel blocker to her regimen.  F/u in 2 weeks.    Asthma/COPD-she is doing well overall on. She reports that her home oxygen saturations have been around 96-97%. She is getting her battery operated oxygen delivered tomorrow by [facility]. Will send of her prescription to see if they can also deliver some saline vials for her to add to her nebulizer treatments. Continue with Spiriva and Breo and daliresp.  Episode of insomnia-she's feeling much better now. They also rearrange some of her medications that she's taking some of them in the morning and some of them in the evening and she actually feels like that has helped as well.  Arthritis - she is due for refill on her meloxicam. New prescription sent to the pharmacy. She also would like a refill on her muscle relaxer. She actually says she's been using less of her hydrocodone and  trying to rely on the anti-inflammatory more.   Diabetes-it looks like her home blood sugar levels look great. Keep regular appointment for diabetic check. Next  She was also noted to have low iron and low D3. She started over-the-counter supplements for both of these.

## 2016-03-25 ENCOUNTER — Other Ambulatory Visit: Payer: Self-pay | Admitting: Family Medicine

## 2016-03-28 ENCOUNTER — Other Ambulatory Visit: Payer: Self-pay

## 2016-03-28 MED ORDER — SODIUM CHLORIDE 0.9 % IN NEBU: 3.0000 mL | INHALATION_SOLUTION | RESPIRATORY_TRACT | Status: DC | PRN

## 2016-04-02 ENCOUNTER — Other Ambulatory Visit: Payer: Self-pay | Admitting: Family Medicine

## 2016-04-03 ENCOUNTER — Ambulatory Visit (INDEPENDENT_AMBULATORY_CARE_PROVIDER_SITE_OTHER): Payer: Commercial Managed Care - HMO | Admitting: Osteopathic Medicine

## 2016-04-03 ENCOUNTER — Encounter: Payer: Self-pay | Admitting: Osteopathic Medicine

## 2016-04-03 ENCOUNTER — Other Ambulatory Visit: Payer: Self-pay | Admitting: Family Medicine

## 2016-04-03 VITALS — BP 137/67 | HR 102 | Temp 97.8°F | Wt 252.0 lb

## 2016-04-03 DIAGNOSIS — J45901 Unspecified asthma with (acute) exacerbation: Secondary | ICD-10-CM

## 2016-04-03 MED ORDER — ALBUTEROL SULFATE 108 (90 BASE) MCG/ACT IN AEPB: 2.0000 | INHALATION_SPRAY | RESPIRATORY_TRACT | Status: DC | PRN

## 2016-04-03 MED ORDER — DOXYCYCLINE HYCLATE 100 MG PO TABS: 100.0000 mg | ORAL_TABLET | Freq: Two times a day (BID) | ORAL | Status: DC

## 2016-04-03 MED ORDER — PREDNISONE 20 MG PO TABS: 20.0000 mg | ORAL_TABLET | Freq: Two times a day (BID) | ORAL | Status: DC

## 2016-04-03 NOTE — Progress Notes (Signed)
HPI: Krystal Burgess is a 55 y.o. female who presents to [facility]  today for chief complaint of:  Chief Complaint  Patient presents with  . chest congestion   Chest congestion/asthma . Quality: wheezing and congestion comes and goes  . Assoc signs/symptoms: see ROS . Duration: few days . Modifying factors: has tried the following OTC/Rx medications: nebulizer - not really helping. Other home inhalers reviewed, she has been compliant with Brio, Spiriva.    Past medical, social and family history reviewed. Current medications and allergies reviewed.     Review of Systems: CONSTITUTIONAL: no fever/chills HEAD/EYES/EARS/NOSE/THROAT: yes sinus headache, no vision change or hearing change, no sore throat CARDIAC: No chest pain/pressure/palpitations RESPIRATORY: yes cough, yes shortness of breath - chronic GASTROINTESTINAL: no nausea, no vomiting, no abdominal pain, no diarrhea MUSCULOSKELETAL: no myalgia/arthralgia   Exam:  BP 137/67 mmHg  Pulse 102  Temp(Src) 97.8 F (36.6 C) (Oral)  Wt 252 lb (114.306 kg)  SpO2 94% Constitutional: VSS, see above. General Appearance: alert, well-developed, well-nourished, NAD Eyes: Normal lids and conjunctive, non-icteric sclera,  Ears, Nose, Mouth, Throat: Normal external inspection ears/nares/mouth/lips/gums, normal TM, MMM; Nasal mucosa normal, some blood in the middle turbinate of the left nostril,      posterior pharynx with erythema, without exudate Neck: No masses, trachea midline. normal lymph nodes Respiratory: Normal respiratory effort. Yes  wheeze on R, no rhonchi/rales Cardiovascular: S1/S2 normal, no murmur/rub/gallop auscultated. RRR.    ASSESSMENT/PLAN:  Lateral wheezing, patient states that she doesn't think this is pneumonia, doesn't feel very sick, declines chest x-ray today for complete ideation. ER/RTC precautions reviewed. Patient states she needed an albuterol refill however she is  unable to afford inhaler, will try savings card for Respiclick otherwise can use albuterol as nebulized solution. Printed prescription given for antibiotics in case starts to get worse over the weekend. Steroids initiated. To follow-up with PCP as instructed  Asthma with acute exacerbation, unspecified asthma severity - Plan: Albuterol Sulfate (PROAIR RESPICLICK) 108 (90 Base) MCG/ACT AEPB, predniSONE (DELTASONE) 20 MG tablet, doxycycline (VIBRA-TABS) 100 MG tablet    Return if symptoms worsen or fail to improve, and as directed by [name].

## 2016-04-03 NOTE — Patient Instructions (Signed)
Albuterol inhaler is not an option for you, we have given your prescription and savings card for respite clinic, which is a different delivery device for the same albuterol medication. Otherwise, can use your albuterol nebulization.  Continue the DuoNeb's up to every 2 hours as needed for shortness of breath/wheezing, if you're finding you're still short of breath despite this treatment as well as the addition of the steroids, it is worth going to be further evaluated in the emergency room. You have also been given a written prescription for antibiotics to take if you start to feel worse over the weekend.   Any questions or concerns, please let us know.

## 2016-04-07 DIAGNOSIS — J449 Chronic obstructive pulmonary disease, unspecified: Secondary | ICD-10-CM | POA: Diagnosis not present

## 2016-04-10 ENCOUNTER — Other Ambulatory Visit: Payer: Self-pay | Admitting: *Deleted

## 2016-04-10 MED ORDER — FLUTICASONE FUROATE-VILANTEROL 100-25 MCG/INH IN AEPB: INHALATION_SPRAY | RESPIRATORY_TRACT | Status: DC

## 2016-04-14 ENCOUNTER — Ambulatory Visit: Payer: Commercial Managed Care - HMO | Admitting: Family Medicine

## 2016-04-23 DIAGNOSIS — J449 Chronic obstructive pulmonary disease, unspecified: Secondary | ICD-10-CM | POA: Diagnosis not present

## 2016-04-27 ENCOUNTER — Other Ambulatory Visit: Payer: Self-pay | Admitting: Family Medicine

## 2016-04-28 ENCOUNTER — Other Ambulatory Visit: Payer: Self-pay | Admitting: Family Medicine

## 2016-05-05 ENCOUNTER — Other Ambulatory Visit: Payer: Self-pay | Admitting: Family Medicine

## 2016-05-05 DIAGNOSIS — H25813 Combined forms of age-related cataract, bilateral: Secondary | ICD-10-CM | POA: Diagnosis not present

## 2016-05-05 DIAGNOSIS — H52223 Regular astigmatism, bilateral: Secondary | ICD-10-CM | POA: Diagnosis not present

## 2016-05-05 DIAGNOSIS — H01001 Unspecified blepharitis right upper eyelid: Secondary | ICD-10-CM | POA: Diagnosis not present

## 2016-05-05 DIAGNOSIS — H02831 Dermatochalasis of right upper eyelid: Secondary | ICD-10-CM | POA: Diagnosis not present

## 2016-05-05 DIAGNOSIS — H40241 Residual stage of angle-closure glaucoma, right eye: Secondary | ICD-10-CM | POA: Diagnosis not present

## 2016-05-05 DIAGNOSIS — H527 Unspecified disorder of refraction: Secondary | ICD-10-CM | POA: Diagnosis not present

## 2016-05-06 ENCOUNTER — Other Ambulatory Visit: Payer: Self-pay | Admitting: Family Medicine

## 2016-05-07 ENCOUNTER — Other Ambulatory Visit: Payer: Self-pay | Admitting: Family Medicine

## 2016-05-07 DIAGNOSIS — J449 Chronic obstructive pulmonary disease, unspecified: Secondary | ICD-10-CM | POA: Diagnosis not present

## 2016-05-09 ENCOUNTER — Other Ambulatory Visit: Payer: Self-pay | Admitting: Family Medicine

## 2016-05-12 ENCOUNTER — Telehealth: Payer: Self-pay

## 2016-05-12 DIAGNOSIS — M48061 Spinal stenosis, lumbar region without neurogenic claudication: Secondary | ICD-10-CM

## 2016-05-12 NOTE — Telephone Encounter (Signed)
Ok to fill. Please make sure she has appt in June. She will have to to call imaging to shedule her MRIs.

## 2016-05-13 ENCOUNTER — Ambulatory Visit: Payer: Commercial Managed Care - HMO | Admitting: Family Medicine

## 2016-05-13 MED ORDER — HYDROCODONE-ACETAMINOPHEN 10-325 MG PO TABS: 1.0000 | ORAL_TABLET | Freq: Four times a day (QID) | ORAL | Status: DC | PRN

## 2016-05-13 NOTE — Telephone Encounter (Signed)
Left message with patient to make an appointment in June, and he script will be at the front desk.  She was also told to call imaging to schedule her MRI.

## 2016-05-15 ENCOUNTER — Telehealth: Payer: Self-pay | Admitting: Family Medicine

## 2016-05-15 DIAGNOSIS — H25812 Combined forms of age-related cataract, left eye: Secondary | ICD-10-CM | POA: Diagnosis not present

## 2016-05-15 DIAGNOSIS — I10 Essential (primary) hypertension: Secondary | ICD-10-CM | POA: Diagnosis not present

## 2016-05-15 DIAGNOSIS — H02831 Dermatochalasis of right upper eyelid: Secondary | ICD-10-CM | POA: Diagnosis not present

## 2016-05-15 DIAGNOSIS — H52223 Regular astigmatism, bilateral: Secondary | ICD-10-CM | POA: Diagnosis not present

## 2016-05-15 DIAGNOSIS — E1136 Type 2 diabetes mellitus with diabetic cataract: Secondary | ICD-10-CM | POA: Diagnosis not present

## 2016-05-15 DIAGNOSIS — J449 Chronic obstructive pulmonary disease, unspecified: Secondary | ICD-10-CM | POA: Diagnosis not present

## 2016-05-15 DIAGNOSIS — H01001 Unspecified blepharitis right upper eyelid: Secondary | ICD-10-CM | POA: Diagnosis not present

## 2016-05-15 DIAGNOSIS — E039 Hypothyroidism, unspecified: Secondary | ICD-10-CM | POA: Diagnosis not present

## 2016-05-15 NOTE — Telephone Encounter (Signed)
Error

## 2016-05-22 DIAGNOSIS — H21541 Posterior synechiae (iris), right eye: Secondary | ICD-10-CM | POA: Insufficient documentation

## 2016-05-22 DIAGNOSIS — Z79899 Other long term (current) drug therapy: Secondary | ICD-10-CM | POA: Diagnosis not present

## 2016-05-22 DIAGNOSIS — H25811 Combined forms of age-related cataract, right eye: Secondary | ICD-10-CM | POA: Diagnosis not present

## 2016-05-22 DIAGNOSIS — I10 Essential (primary) hypertension: Secondary | ICD-10-CM | POA: Diagnosis not present

## 2016-05-22 DIAGNOSIS — K219 Gastro-esophageal reflux disease without esophagitis: Secondary | ICD-10-CM | POA: Diagnosis not present

## 2016-05-22 DIAGNOSIS — J449 Chronic obstructive pulmonary disease, unspecified: Secondary | ICD-10-CM | POA: Diagnosis not present

## 2016-05-22 DIAGNOSIS — E1142 Type 2 diabetes mellitus with diabetic polyneuropathy: Secondary | ICD-10-CM | POA: Diagnosis not present

## 2016-05-22 DIAGNOSIS — F17210 Nicotine dependence, cigarettes, uncomplicated: Secondary | ICD-10-CM | POA: Diagnosis not present

## 2016-05-24 DIAGNOSIS — J449 Chronic obstructive pulmonary disease, unspecified: Secondary | ICD-10-CM | POA: Diagnosis not present

## 2016-05-28 ENCOUNTER — Other Ambulatory Visit: Payer: Self-pay | Admitting: Family Medicine

## 2016-06-07 DIAGNOSIS — J449 Chronic obstructive pulmonary disease, unspecified: Secondary | ICD-10-CM | POA: Diagnosis not present

## 2016-06-13 ENCOUNTER — Other Ambulatory Visit: Payer: Self-pay | Admitting: Family Medicine

## 2016-06-14 ENCOUNTER — Other Ambulatory Visit: Payer: Self-pay | Admitting: Family Medicine

## 2016-06-15 ENCOUNTER — Other Ambulatory Visit: Payer: Self-pay | Admitting: Family Medicine

## 2016-06-19 ENCOUNTER — Other Ambulatory Visit: Payer: Self-pay | Admitting: Family Medicine

## 2016-06-23 DIAGNOSIS — J449 Chronic obstructive pulmonary disease, unspecified: Secondary | ICD-10-CM | POA: Diagnosis not present

## 2016-06-26 ENCOUNTER — Ambulatory Visit: Payer: Commercial Managed Care - HMO | Admitting: Family Medicine

## 2016-06-26 ENCOUNTER — Other Ambulatory Visit: Payer: Self-pay | Admitting: Family Medicine

## 2016-06-30 ENCOUNTER — Ambulatory Visit: Payer: Commercial Managed Care - HMO | Admitting: Physician Assistant

## 2016-07-02 ENCOUNTER — Other Ambulatory Visit: Payer: Self-pay | Admitting: Family Medicine

## 2016-07-04 ENCOUNTER — Other Ambulatory Visit: Payer: Self-pay | Admitting: Family Medicine

## 2016-07-07 ENCOUNTER — Other Ambulatory Visit: Payer: Self-pay

## 2016-07-07 DIAGNOSIS — J449 Chronic obstructive pulmonary disease, unspecified: Secondary | ICD-10-CM | POA: Diagnosis not present

## 2016-07-07 DIAGNOSIS — M48061 Spinal stenosis, lumbar region without neurogenic claudication: Secondary | ICD-10-CM

## 2016-07-07 MED ORDER — HYDROCODONE-ACETAMINOPHEN 10-325 MG PO TABS: 1.0000 | ORAL_TABLET | Freq: Four times a day (QID) | ORAL | Status: DC | PRN

## 2016-07-10 ENCOUNTER — Other Ambulatory Visit: Payer: Self-pay | Admitting: Family Medicine

## 2016-07-16 ENCOUNTER — Telehealth: Payer: Self-pay | Admitting: Family Medicine

## 2016-07-16 NOTE — Telephone Encounter (Signed)
I called patient and spoke with her about scheduling her DM f/u with her pcp but she states she has no transportation and will call when she can

## 2016-07-17 ENCOUNTER — Other Ambulatory Visit: Payer: Self-pay | Admitting: Family Medicine

## 2016-07-18 DIAGNOSIS — H53001 Unspecified amblyopia, right eye: Secondary | ICD-10-CM | POA: Diagnosis not present

## 2016-07-24 DIAGNOSIS — J449 Chronic obstructive pulmonary disease, unspecified: Secondary | ICD-10-CM | POA: Diagnosis not present

## 2016-07-27 ENCOUNTER — Other Ambulatory Visit: Payer: Self-pay | Admitting: Family Medicine

## 2016-07-30 ENCOUNTER — Other Ambulatory Visit: Payer: Self-pay | Admitting: Family Medicine

## 2016-08-04 ENCOUNTER — Other Ambulatory Visit: Payer: Self-pay | Admitting: Family Medicine

## 2016-08-06 ENCOUNTER — Other Ambulatory Visit: Payer: Self-pay | Admitting: Family Medicine

## 2016-08-07 DIAGNOSIS — J449 Chronic obstructive pulmonary disease, unspecified: Secondary | ICD-10-CM | POA: Diagnosis not present

## 2016-08-08 ENCOUNTER — Other Ambulatory Visit: Payer: Self-pay | Admitting: Family Medicine

## 2016-08-12 ENCOUNTER — Ambulatory Visit: Payer: Commercial Managed Care - HMO | Admitting: Family Medicine

## 2016-08-14 ENCOUNTER — Ambulatory Visit (INDEPENDENT_AMBULATORY_CARE_PROVIDER_SITE_OTHER): Payer: Commercial Managed Care - HMO | Admitting: Family Medicine

## 2016-08-14 ENCOUNTER — Encounter: Payer: Self-pay | Admitting: Family Medicine

## 2016-08-14 VITALS — BP 145/86 | HR 103 | Wt 245.0 lb

## 2016-08-14 DIAGNOSIS — J45909 Unspecified asthma, uncomplicated: Secondary | ICD-10-CM

## 2016-08-14 DIAGNOSIS — M2550 Pain in unspecified joint: Secondary | ICD-10-CM

## 2016-08-14 DIAGNOSIS — J449 Chronic obstructive pulmonary disease, unspecified: Secondary | ICD-10-CM

## 2016-08-14 DIAGNOSIS — J4489 Other specified chronic obstructive pulmonary disease: Secondary | ICD-10-CM

## 2016-08-14 DIAGNOSIS — I10 Essential (primary) hypertension: Secondary | ICD-10-CM

## 2016-08-14 DIAGNOSIS — M5136 Other intervertebral disc degeneration, lumbar region: Secondary | ICD-10-CM | POA: Insufficient documentation

## 2016-08-14 DIAGNOSIS — Z79899 Other long term (current) drug therapy: Secondary | ICD-10-CM | POA: Diagnosis not present

## 2016-08-14 DIAGNOSIS — M4806 Spinal stenosis, lumbar region: Secondary | ICD-10-CM | POA: Diagnosis not present

## 2016-08-14 DIAGNOSIS — Z23 Encounter for immunization: Secondary | ICD-10-CM | POA: Diagnosis not present

## 2016-08-14 DIAGNOSIS — J45901 Unspecified asthma with (acute) exacerbation: Secondary | ICD-10-CM

## 2016-08-14 DIAGNOSIS — M51369 Other intervertebral disc degeneration, lumbar region without mention of lumbar back pain or lower extremity pain: Secondary | ICD-10-CM

## 2016-08-14 DIAGNOSIS — E119 Type 2 diabetes mellitus without complications: Secondary | ICD-10-CM

## 2016-08-14 DIAGNOSIS — M48061 Spinal stenosis, lumbar region without neurogenic claudication: Secondary | ICD-10-CM

## 2016-08-14 DIAGNOSIS — G8929 Other chronic pain: Secondary | ICD-10-CM

## 2016-08-14 DIAGNOSIS — M542 Cervicalgia: Secondary | ICD-10-CM

## 2016-08-14 MED ORDER — NYSTATIN 100000 UNIT/ML MT SUSP: OROMUCOSAL | 0 refills | Status: DC

## 2016-08-14 MED ORDER — ZOLPIDEM TARTRATE 10 MG PO TABS: 10.0000 mg | ORAL_TABLET | Freq: Every day | ORAL | 0 refills | Status: DC

## 2016-08-14 MED ORDER — LEVOFLOXACIN 500 MG PO TABS: 500.0000 mg | ORAL_TABLET | Freq: Every day | ORAL | 0 refills | Status: AC

## 2016-08-14 MED ORDER — LOSARTAN POTASSIUM 100 MG PO TABS: ORAL_TABLET | ORAL | 1 refills | Status: DC

## 2016-08-14 MED ORDER — PREDNISONE 20 MG PO TABS: 20.0000 mg | ORAL_TABLET | Freq: Two times a day (BID) | ORAL | 0 refills | Status: DC

## 2016-08-14 MED ORDER — HYDROCODONE-ACETAMINOPHEN 10-325 MG PO TABS: 1.0000 | ORAL_TABLET | Freq: Four times a day (QID) | ORAL | 0 refills | Status: DC | PRN

## 2016-08-14 MED ORDER — CLONAZEPAM 0.5 MG PO TABS: 0.5000 mg | ORAL_TABLET | Freq: Two times a day (BID) | ORAL | 1 refills | Status: DC | PRN

## 2016-08-14 MED ORDER — ROFLUMILAST 500 MCG PO TABS: 500.0000 ug | ORAL_TABLET | Freq: Every day | ORAL | 6 refills | Status: DC

## 2016-08-14 MED ORDER — SITAGLIPTIN PHOSPHATE 100 MG PO TABS: 100.0000 mg | ORAL_TABLET | Freq: Every day | ORAL | 6 refills | Status: DC

## 2016-08-14 MED ORDER — METHOCARBAMOL 500 MG PO TABS: ORAL_TABLET | ORAL | 0 refills | Status: DC

## 2016-08-14 MED ORDER — ALBUTEROL SULFATE HFA 108 (90 BASE) MCG/ACT IN AERS: 2.0000 | INHALATION_SPRAY | RESPIRATORY_TRACT | 11 refills | Status: DC | PRN

## 2016-08-14 MED ORDER — PROMETHAZINE HCL 25 MG PO TABS: ORAL_TABLET | ORAL | 0 refills | Status: DC

## 2016-08-14 NOTE — Progress Notes (Signed)
Subjective:    CC: DM  HPI: Diabetes - no hypoglycemic events. No wounds or sores that are not healing well. No increased thirst or urination. Checking glucose at home. Taking medications as prescribed without any side effects.  COPD with Asthma - She still feels like she is short of breath. There hasn't been noticing as much wheezing. She still feels like she has a sinus infection. She's very congested. She says to the point where she can't blow anything out but has to get a Q-tip to try to clean out her nose. She says she's been getting nosebleeds from the left nostril. She has been using her Flonase regularly.  Lumbar spinal stenosis-she does request a refill on her hydrocodone today. She says it's a little bit early but she was able to get a ride today as she is not currently license to drive. She was due for renewal but that's when she was having problems with her eyes and so new she would not be past the vision test. She's planning on reapplying for her license in the next month or 2. She does note that the pain medication is causing some chronic constipation. She tried taking MiraLAX but it didn't seem to help. She then tried Dulcolax.  She's also been having a lot of joint pain in her hips knees and hands. She wonders if she could have rheumatoid arthritis. She feels like she has noticed some swelling across her MCP joints. No known family history rheumatoid arthritis.  She would really like to be seen by pain management but says that she was told that she'll likely need an MRI for further evaluation. She would prefer to have that done here and then once we have the information then placed referral to pain management. She is due for refill on her hydrocodone today  Past medical history, Surgical history, Family history not pertinant except as noted below, Social history, Allergies, and medications have been entered into the medical record, reviewed, and corrections made.   Review of Systems:  No fevers, chills, night sweats, weight loss, chest pain, or shortness of breath.   Objective:    General: Well Developed, well nourished, and in no acute distress.  Neuro: Alert and oriented x3, extra-ocular muscles intact, sensation grossly intact.  HEENT: Normocephalic, atraumatic, Oropharynx is clear but appears to be dry. No significant cervical lymphadenopathy. TMs and canals are clear bilaterally.  Skin: Warm and dry, no rashes. Cardiac: Regular rate and rhythm, no murmurs rubs or gallops, no lower extremity edema.  Respiratory: Clear to auscultation bilaterally. Not using accessory muscles, speaking in full sentences.   Impression and Recommendations:   DM- Uncontrolled. 11 A1c of 5.6 today. Continue current regimen. Follow up in 3 months.  COPD with Asthma - She actually has great lung sounds today but has been more short of breath.  Chronic constipation - discussed that she do a clean out first with a stimulant laxative and then get on miralax regularly to maintain.    Acute sinusitis - treat with Levaquin and prednisone. Call if not significantly better in one week. Recommend using nasal saline regularly. Recommend irrigation and not just nasal spray. I think this would help.  Nosebleeds- . Flonase for the next 2-3 weeks to give things a chance to heal. She's doing well at that point and can consider restarting it.  Degenerative disc disease lumbar spine-we'll go ahead and order MRI. Refilled hydrocodone today. Updated her pain contract. And had her complete an opioid risk stool.  Chronic cervical pain-with known degenerative disc disease-we'll go ahead and order MRI.  Time spent 45 min, > 50% spent counseling about DM, COPD, constipation, sinus sxs, and nosebleeds.

## 2016-08-14 NOTE — Patient Instructions (Addendum)
Stop flonase for 3 weeks until nosebleeds resolve.    Take 1/2 tab seroquel twice a day.

## 2016-08-14 NOTE — Addendum Note (Signed)
Addended by: Deno Etienne on: 08/14/2016 04:28 PM   Modules accepted: Orders

## 2016-08-15 LAB — COMPLETE METABOLIC PANEL WITH GFR
ALBUMIN: 4 g/dL (ref 3.6–5.1)
ALK PHOS: 122 U/L (ref 33–130)
ALT: 20 U/L (ref 6–29)
AST: 31 U/L (ref 10–35)
BILIRUBIN TOTAL: 0.3 mg/dL (ref 0.2–1.2)
BUN: 9 mg/dL (ref 7–25)
CO2: 23 mmol/L (ref 20–31)
CREATININE: 1.31 mg/dL — AB (ref 0.50–1.05)
Calcium: 9.3 mg/dL (ref 8.6–10.4)
Chloride: 103 mmol/L (ref 98–110)
GFR, Est African American: 54 mL/min — ABNORMAL LOW (ref >=60)
GFR, Est Non African American: 47 mL/min — ABNORMAL LOW (ref >=60)
GLUCOSE: 90 mg/dL (ref 65–99)
Potassium: 4.6 mmol/L (ref 3.5–5.3)
SODIUM: 137 mmol/L (ref 135–146)
TOTAL PROTEIN: 6.5 g/dL (ref 6.1–8.1)

## 2016-08-15 LAB — SEDIMENTATION RATE: Sed Rate: 40 mm/hr — ABNORMAL HIGH (ref 0–30)

## 2016-08-15 LAB — C-REACTIVE PROTEIN

## 2016-08-15 LAB — RHEUMATOID FACTOR: Rhuematoid fact SerPl-aCnc: <10 IU/mL (ref <=14)

## 2016-08-15 LAB — ANA: Anti Nuclear Antibody(ANA): NEGATIVE

## 2016-08-15 LAB — CYCLIC CITRUL PEPTIDE ANTIBODY, IGG: CYCLIC CITRULLIN PEPTIDE AB: 29 U — AB

## 2016-08-24 DIAGNOSIS — J449 Chronic obstructive pulmonary disease, unspecified: Secondary | ICD-10-CM | POA: Diagnosis not present

## 2016-08-26 LAB — PAIN MGMT, PROFILE 6 CONF W/O MM, U
6 ACETYLMORPHINE: NEGATIVE ng/mL (ref <10)
ALCOHOL METABOLITES: NEGATIVE ng/mL (ref <500)
ALPHAHYDROXYMIDAZOLAM: NEGATIVE ng/mL (ref <50)
ALPHAHYDROXYTRIAZOLAM: NEGATIVE ng/mL (ref <50)
Alphahydroxyalprazolam: 612 ng/mL — ABNORMAL HIGH (ref <25)
Aminoclonazepam: NEGATIVE ng/mL (ref <25)
Amphetamines: NEGATIVE ng/mL (ref <500)
BARBITURATES: NEGATIVE ng/mL (ref <300)
Benzodiazepines: POSITIVE ng/mL — AB (ref <100)
COCAINE METABOLITE: NEGATIVE ng/mL (ref <150)
Codeine: NEGATIVE ng/mL (ref <50)
Creatinine: 112.7 mg/dL (ref >=20.0)
HYDROCODONE: 3223 ng/mL — AB (ref <50)
HYDROXYETHYLFLURAZEPAM: NEGATIVE ng/mL (ref <50)
Hydromorphone: 801 ng/mL — ABNORMAL HIGH (ref <50)
Lorazepam: NEGATIVE ng/mL (ref <50)
METHADONE METABOLITE: NEGATIVE ng/mL (ref <100)
Marijuana Metabolite: NEGATIVE ng/mL (ref <20)
Morphine: NEGATIVE ng/mL (ref <50)
NORHYDROCODONE: 3912 ng/mL — AB (ref <50)
Nordiazepam: NEGATIVE ng/mL (ref <50)
OPIATES: POSITIVE ng/mL — AB (ref <100)
OXAZEPAM: NEGATIVE ng/mL (ref <50)
OXYCODONE: NEGATIVE ng/mL (ref <100)
Oxidant: NEGATIVE ug/mL (ref <200)
PHENCYCLIDINE: NEGATIVE ng/mL (ref <25)
PLEASE NOTE: 0
TEMAZEPAM: NEGATIVE ng/mL (ref <50)
pH: 5.85 (ref 4.5–9.0)

## 2016-08-29 ENCOUNTER — Other Ambulatory Visit: Payer: Self-pay | Admitting: Family Medicine

## 2016-09-02 ENCOUNTER — Other Ambulatory Visit: Payer: Self-pay | Admitting: Family Medicine

## 2016-09-07 DIAGNOSIS — J449 Chronic obstructive pulmonary disease, unspecified: Secondary | ICD-10-CM | POA: Diagnosis not present

## 2016-09-13 ENCOUNTER — Other Ambulatory Visit: Payer: Self-pay | Admitting: Family Medicine

## 2016-09-15 ENCOUNTER — Other Ambulatory Visit: Payer: Commercial Managed Care - HMO

## 2016-09-22 ENCOUNTER — Other Ambulatory Visit: Payer: Self-pay | Admitting: Family Medicine

## 2016-09-23 DIAGNOSIS — J449 Chronic obstructive pulmonary disease, unspecified: Secondary | ICD-10-CM | POA: Diagnosis not present

## 2016-09-25 ENCOUNTER — Other Ambulatory Visit: Payer: Self-pay | Admitting: Family Medicine

## 2016-10-07 ENCOUNTER — Ambulatory Visit: Payer: Commercial Managed Care - HMO | Admitting: Sports Medicine

## 2016-10-07 DIAGNOSIS — Z961 Presence of intraocular lens: Secondary | ICD-10-CM | POA: Diagnosis not present

## 2016-10-07 DIAGNOSIS — H52223 Regular astigmatism, bilateral: Secondary | ICD-10-CM | POA: Diagnosis not present

## 2016-10-07 DIAGNOSIS — J449 Chronic obstructive pulmonary disease, unspecified: Secondary | ICD-10-CM | POA: Diagnosis not present

## 2016-10-15 ENCOUNTER — Other Ambulatory Visit: Payer: Self-pay

## 2016-10-15 ENCOUNTER — Other Ambulatory Visit: Payer: Self-pay | Admitting: Family Medicine

## 2016-10-15 DIAGNOSIS — M48061 Spinal stenosis, lumbar region without neurogenic claudication: Secondary | ICD-10-CM

## 2016-10-15 MED ORDER — HYDROCODONE-ACETAMINOPHEN 10-325 MG PO TABS: 1.0000 | ORAL_TABLET | Freq: Four times a day (QID) | ORAL | 0 refills | Status: DC | PRN

## 2016-10-15 NOTE — Telephone Encounter (Signed)
Pt is requesting a refill of hydrocodone.  She hasn't went to pain management.  Due to her being sick she couldn't get the MRI done.  Also she doesn't feel like clonazepam is working she feels that she is having more panic attacks. She also stated that you have wrote for her a compound lidocaine cream earlier this year but never got the Rx and would like this refilled as well. Please advise. -[facility]/RMA

## 2016-10-15 NOTE — Telephone Encounter (Signed)
Call patient: I will go ahead and refill her hydrocodone but she absolutely needs to make the appointment. I did print a prescription.  She does not have to have an MRI before going to pain management. Certainly they will want one so at least she has it scheduled then they should still be willing to see her. Also as far as the anxiety we are actually going to have to taper off her clonazepam. Based on the new narcotic prescribing guidelines she can no longer be on both a benzodiazepine and pain medication. As Far as the lidocaine. If she never filled it then it should still be on file at the pharmacy for them to fill.

## 2016-10-16 NOTE — Telephone Encounter (Signed)
Pt notified of recommendations

## 2016-10-16 NOTE — Addendum Note (Signed)
Addended by: Collie Siad on: 10/16/2016 01:54 PM   Modules accepted: Orders

## 2016-10-16 NOTE — Telephone Encounter (Signed)
Pt returned clinic call stating she was "half-asleep" when she talked to someone this morning and didn't fully understand. Went over PCP recommendations regarding medications in detail. Pt states she "can't come off her nerve pills or she will end up in the looney bin." Pt then states "I'd rather not be on pain pills, I need my nerve medicine." Advised Pt this needs to be discussed in an office visit, transferred to scheduling for an appt. Pt does request a refill on the Klonopin to last until she can get in with PCP (first available: 10/30/16). Will route.

## 2016-10-20 ENCOUNTER — Ambulatory Visit (INDEPENDENT_AMBULATORY_CARE_PROVIDER_SITE_OTHER): Payer: Commercial Managed Care - HMO

## 2016-10-20 DIAGNOSIS — M5117 Intervertebral disc disorders with radiculopathy, lumbosacral region: Secondary | ICD-10-CM

## 2016-10-20 DIAGNOSIS — M48061 Spinal stenosis, lumbar region without neurogenic claudication: Secondary | ICD-10-CM

## 2016-10-20 DIAGNOSIS — M545 Low back pain: Secondary | ICD-10-CM | POA: Diagnosis not present

## 2016-10-20 DIAGNOSIS — M5116 Intervertebral disc disorders with radiculopathy, lumbar region: Secondary | ICD-10-CM

## 2016-10-22 ENCOUNTER — Other Ambulatory Visit: Payer: Self-pay | Admitting: Family Medicine

## 2016-10-22 MED ORDER — CLONAZEPAM 0.5 MG PO TABS: 0.5000 mg | ORAL_TABLET | Freq: Two times a day (BID) | ORAL | 0 refills | Status: DC | PRN

## 2016-10-24 DIAGNOSIS — J449 Chronic obstructive pulmonary disease, unspecified: Secondary | ICD-10-CM | POA: Diagnosis not present

## 2016-10-30 ENCOUNTER — Ambulatory Visit (INDEPENDENT_AMBULATORY_CARE_PROVIDER_SITE_OTHER): Payer: Commercial Managed Care - HMO | Admitting: Family Medicine

## 2016-10-30 ENCOUNTER — Encounter: Payer: Self-pay | Admitting: Family Medicine

## 2016-10-30 VITALS — BP 124/73 | HR 106 | Ht 61.0 in | Wt 257.0 lb

## 2016-10-30 DIAGNOSIS — M5136 Other intervertebral disc degeneration, lumbar region: Secondary | ICD-10-CM | POA: Diagnosis not present

## 2016-10-30 DIAGNOSIS — F319 Bipolar disorder, unspecified: Secondary | ICD-10-CM

## 2016-10-30 DIAGNOSIS — J449 Chronic obstructive pulmonary disease, unspecified: Secondary | ICD-10-CM

## 2016-10-30 DIAGNOSIS — M48061 Spinal stenosis, lumbar region without neurogenic claudication: Secondary | ICD-10-CM | POA: Diagnosis not present

## 2016-10-30 DIAGNOSIS — F411 Generalized anxiety disorder: Secondary | ICD-10-CM | POA: Diagnosis not present

## 2016-10-30 MED ORDER — METHYLPREDNISOLONE SODIUM SUCC 125 MG IJ SOLR
125.0000 mg | Freq: Once | INTRAMUSCULAR | Status: AC
  Administered 2016-10-30: 125 mg via INTRAMUSCULAR

## 2016-10-30 MED ORDER — AZITHROMYCIN 250 MG PO TABS: ORAL_TABLET | ORAL | 0 refills | Status: AC

## 2016-10-30 MED ORDER — ROSUVASTATIN CALCIUM 20 MG PO TABS: 20.0000 mg | ORAL_TABLET | Freq: Every day | ORAL | 1 refills | Status: DC

## 2016-10-30 MED ORDER — PREDNISONE 20 MG PO TABS: 40.0000 mg | ORAL_TABLET | Freq: Every day | ORAL | 0 refills | Status: DC

## 2016-10-30 NOTE — Progress Notes (Signed)
Subjective:    CC: Anxiety  HPI:  Follow-up anxiety - when she called for her narcotic refills, I informed her that we would have to start weaning her clonazepam since she is Artie on chronic narcotics. She says some morning she doesn't even take a clonazepam but she does take it consistently at nighttime.  COPD - she has been using her albuterol more recently while she felt like she's had a sinus congestion. Her last epidural treatment was about an hour ago. She uses her inhalers regularly. She still continues to smoke. In fact she says she is on her third carton since the first of the month which is approximately 8 days. She has tried nicotine replacement and Chantix in the past but says it makes her feel sick.  Sinus congestion with green mucous discharge for several week. No fever, chills ore sweat. + HA and sinuis pressure.  She has smoked 2 cartons of cigarettes in the last 8 days.  SOB about treatment was around 1:00 today, about an hour ago.  Chronic low back pain with lumbar degenerative disc disease and spinal stenosis-she has finally made her appointment with pain management later this month on November 14 at 8:30 AM. She is most interested in getting injections. She's responded to those well in the past and she is hoping that will relieve her pain and decrease her use of pain medications.  Her mother is here with her today for the visit.  Past medical history, Surgical history, Family history not pertinant except as noted below, Social history, Allergies, and medications have been entered into the medical record, reviewed, and corrections made.   Review of Systems: No fevers, chills, night sweats, weight loss, chest pain, or shortness of breath.   Objective:    General: Well Developed, well nourished, and in no acute distress.  Neuro: Alert and oriented x3, extra-ocular muscles intact, sensation grossly intact.  HEENT: Normocephalic, atraumatic , Oropharynx appears dry. TMs and  canals are clear bilaterally. No significant cervical lymphadenopathy. Skin: Warm and dry, no rashes. Cardiac: Regular rate and rhythm, no murmurs rubs or gallops, no lower extremity edema.  Respiratory: Inspiratory wheeze.. Not using accessory muscles, speaking in full sentences.No increased work of breathing. No crackles or diffuse rhonchi.   Impression and Recommendations:   Anxiety - Refer to Dr. Gilmore Laroche. I want to see if he can help [place] manage her anxiety and mood disorder while we work on weaning her clonazepam. Next refill we will only give 45 tabs and it will be written for a half a tab in the morning and a whole tab in the evening for one month. The next month will go down to 1 tab daily and so on.  Acute sinusitis - treat with azithromycin, 5 days of oral prednisone, and Solu-Medrol 125 IM given here today in the office.  COPD exacerbation-given oral prednisone and Solu-Medrol. She says she is getting go straight home and do another nebulizer treatment.  Low back pain with spinal stenosis-keep appointment with pain management.

## 2016-11-07 DIAGNOSIS — J449 Chronic obstructive pulmonary disease, unspecified: Secondary | ICD-10-CM | POA: Diagnosis not present

## 2016-11-17 ENCOUNTER — Other Ambulatory Visit: Payer: Self-pay | Admitting: Family Medicine

## 2016-11-18 ENCOUNTER — Other Ambulatory Visit: Payer: Self-pay | Admitting: *Deleted

## 2016-11-18 MED ORDER — ZOLPIDEM TARTRATE 10 MG PO TABS: 10.0000 mg | ORAL_TABLET | Freq: Every day | ORAL | 0 refills | Status: DC

## 2016-11-19 ENCOUNTER — Other Ambulatory Visit: Payer: Self-pay | Admitting: Family Medicine

## 2016-11-19 NOTE — Telephone Encounter (Signed)
At pt's last OV you stated that you were going to start weaning her down off of this medication. She stated that  She some mornings she doesn't take them but she does take at nite time.   She is not due until 11/21/16. She was given #60. Ok to fill for same amount?Laureen Ochs, Viann Shove

## 2016-11-20 NOTE — Telephone Encounter (Signed)
Ok lets fill for 45 tabs.  She can start weaning to 1/2 tabs.  Insturction can say 0.5-1 tab po BID PRN.

## 2016-11-21 ENCOUNTER — Other Ambulatory Visit: Payer: Self-pay

## 2016-11-21 MED ORDER — MELOXICAM 15 MG PO TABS: 15.0000 mg | ORAL_TABLET | Freq: Every day | ORAL | 3 refills | Status: DC

## 2016-11-21 MED ORDER — FLUTICASONE PROPIONATE 50 MCG/ACT NA SUSP: NASAL | 3 refills | Status: DC

## 2016-11-21 MED ORDER — AMLODIPINE BESYLATE 5 MG PO TABS: 5.0000 mg | ORAL_TABLET | Freq: Every day | ORAL | 3 refills | Status: DC

## 2016-11-23 DIAGNOSIS — J449 Chronic obstructive pulmonary disease, unspecified: Secondary | ICD-10-CM | POA: Diagnosis not present

## 2016-12-02 ENCOUNTER — Other Ambulatory Visit: Payer: Self-pay | Admitting: Family Medicine

## 2016-12-04 ENCOUNTER — Ambulatory Visit (HOSPITAL_COMMUNITY): Payer: Commercial Managed Care - HMO | Admitting: Psychiatry

## 2016-12-07 DIAGNOSIS — J449 Chronic obstructive pulmonary disease, unspecified: Secondary | ICD-10-CM | POA: Diagnosis not present

## 2016-12-09 ENCOUNTER — Telehealth: Payer: Self-pay

## 2016-12-09 DIAGNOSIS — M48061 Spinal stenosis, lumbar region without neurogenic claudication: Secondary | ICD-10-CM

## 2016-12-09 NOTE — Telephone Encounter (Signed)
See other telephone note.  

## 2016-12-09 NOTE — Telephone Encounter (Signed)
Krystal Burgess is calling for a refill for hydrocodone. She states the pain management center needs another refer. Please advise.

## 2016-12-10 ENCOUNTER — Other Ambulatory Visit: Payer: Self-pay | Admitting: Family Medicine

## 2016-12-10 MED ORDER — HYDROCODONE-ACETAMINOPHEN 10-325 MG PO TABS: 1.0000 | ORAL_TABLET | Freq: Four times a day (QID) | ORAL | 0 refills | Status: DC | PRN

## 2016-12-10 NOTE — Telephone Encounter (Signed)
Left message advising patient to pick up prescription. Referral sent.

## 2016-12-10 NOTE — Telephone Encounter (Signed)
Ok for new referral.  I will refill one more time and that is it.

## 2016-12-12 ENCOUNTER — Ambulatory Visit (INDEPENDENT_AMBULATORY_CARE_PROVIDER_SITE_OTHER): Payer: Commercial Managed Care - HMO | Admitting: Sports Medicine

## 2016-12-12 ENCOUNTER — Ambulatory Visit (INDEPENDENT_AMBULATORY_CARE_PROVIDER_SITE_OTHER): Payer: Commercial Managed Care - HMO

## 2016-12-12 ENCOUNTER — Encounter: Payer: Self-pay | Admitting: Sports Medicine

## 2016-12-12 DIAGNOSIS — R0602 Shortness of breath: Secondary | ICD-10-CM | POA: Diagnosis not present

## 2016-12-12 DIAGNOSIS — R05 Cough: Secondary | ICD-10-CM | POA: Diagnosis not present

## 2016-12-12 DIAGNOSIS — J4489 Other specified chronic obstructive pulmonary disease: Secondary | ICD-10-CM

## 2016-12-12 DIAGNOSIS — J449 Chronic obstructive pulmonary disease, unspecified: Secondary | ICD-10-CM

## 2016-12-12 DIAGNOSIS — F17200 Nicotine dependence, unspecified, uncomplicated: Secondary | ICD-10-CM | POA: Diagnosis not present

## 2016-12-12 MED ORDER — PREDNISONE 10 MG (48) PO TBPK: ORAL_TABLET | Freq: Every day | ORAL | 0 refills | Status: DC

## 2016-12-12 MED ORDER — VARENICLINE TARTRATE 0.5 MG X 11 & 1 MG X 42 PO MISC: ORAL | 0 refills | Status: DC

## 2016-12-12 MED ORDER — FLUTICASONE FUROATE-VILANTEROL 200-25 MCG/INH IN AEPB: 1.0000 | INHALATION_SPRAY | Freq: Every day | RESPIRATORY_TRACT | 3 refills | Status: DC

## 2016-12-12 MED ORDER — DOXYCYCLINE HYCLATE 100 MG PO TABS: 100.0000 mg | ORAL_TABLET | Freq: Two times a day (BID) | ORAL | 0 refills | Status: AC

## 2016-12-12 NOTE — Addendum Note (Signed)
Addended by: Monica Becton on: 12/12/2016 03:07 PM   Modules accepted: Orders

## 2016-12-12 NOTE — Assessment & Plan Note (Addendum)
Currently in a mild exacerbation with poor air movement and expiratory wheezes in the bases. Chest x-ray, doxycycline, prednisone taper, increasing Breo to 200 mg, adding Chantix.  There is what appears to be a possible pneumonia in the lingula, there is a possibility that this represents a neoplasm, CT scan needs to be performed in 4 weeks, I'm going to place the order and she should schedule for 4 weeks from now. I am going to place an order for renal function.

## 2016-12-12 NOTE — Progress Notes (Signed)
  Subjective:    CC: Coughing  HPI: This is a pleasant 55 year old female with COPD, she still smokes 3 packs a day. Unfortunately he has had persistence of cough, wheezing, mild shortness of breath. She tells me she is compliant with all of her inhalers and is agreeable to try Chantix to quit smoking. Symptoms are moderate, persistent. No chest pain, no fevers or chills.  Past medical history:  Negative.  See flowsheet/record as well for more information.  Surgical history: Negative.  See flowsheet/record as well for more information.  Family history: Negative.  See flowsheet/record as well for more information.  Social history: Negative.  See flowsheet/record as well for more information.  Allergies, and medications have been entered into the medical record, reviewed, and no changes needed.   Review of Systems: No fevers, chills, night sweats, weight loss, chest pain, or shortness of breath.   Objective:    General: Well Developed, well nourished, and in no acute distress.  Neuro: Alert and oriented x3, extra-ocular muscles intact, sensation grossly intact.  HEENT: Normocephalic, atraumatic, pupils equal round reactive to light, neck supple, no masses, no lymphadenopathy, thyroid nonpalpable.  Skin: Warm and dry, no rashes. Cardiac: Regular rate and rhythm, no murmurs rubs or gallops, no lower extremity edema.  Respiratory: Relatively poor air movement with diffuse expiratory wheezes mostly in the lower lung fields. Not using accessory muscles, speaking in full sentences.  Impression and Recommendations:    COPD with asthma (HCC) Currently in a mild exacerbation with poor air movement and expiratory wheezes in the bases. Chest x-ray, doxycycline, prednisone taper, increasing Breo to 200 mg, adding Chantix.  I spent 25 minutes with this patient, greater than 50% was face-to-face time counseling regarding the above diagnoses

## 2016-12-16 ENCOUNTER — Other Ambulatory Visit: Payer: Self-pay | Admitting: Family Medicine

## 2016-12-24 DIAGNOSIS — J449 Chronic obstructive pulmonary disease, unspecified: Secondary | ICD-10-CM | POA: Diagnosis not present

## 2016-12-28 ENCOUNTER — Other Ambulatory Visit: Payer: Self-pay | Admitting: Family Medicine

## 2016-12-30 ENCOUNTER — Other Ambulatory Visit: Payer: Self-pay | Admitting: *Deleted

## 2017-01-04 ENCOUNTER — Other Ambulatory Visit: Payer: Self-pay | Admitting: Family Medicine

## 2017-01-07 DIAGNOSIS — J449 Chronic obstructive pulmonary disease, unspecified: Secondary | ICD-10-CM | POA: Diagnosis not present

## 2017-01-09 ENCOUNTER — Ambulatory Visit (HOSPITAL_BASED_OUTPATIENT_CLINIC_OR_DEPARTMENT_OTHER): Admission: RE | Admit: 2017-01-09 | Payer: Medicare HMO | Source: Ambulatory Visit

## 2017-01-10 ENCOUNTER — Other Ambulatory Visit: Payer: Self-pay | Admitting: Family Medicine

## 2017-01-12 ENCOUNTER — Other Ambulatory Visit: Payer: Self-pay | Admitting: Family Medicine

## 2017-01-13 ENCOUNTER — Telehealth: Payer: Self-pay

## 2017-01-13 NOTE — Telephone Encounter (Signed)
See note

## 2017-01-13 NOTE — Telephone Encounter (Signed)
Krystal Burgess called for a refill on Meloxicam. I noticed there was a 90 day with 3 refills sent in December. I called the pharmacy and she only received a 30 day supply and the rest of the prescription was cancelled by [place] or the patient. She does have NSAIDS on her allergy list for upper GI bleed. Please advise if I can refill this medication.

## 2017-01-13 NOTE — Telephone Encounter (Signed)
I agree with her history of GI bleed, we need to stay away from all NSAIDs.  They should be able to put her on something at the pain management clinic for her arthritis. Nani Gasser, MD

## 2017-01-14 ENCOUNTER — Ambulatory Visit (INDEPENDENT_AMBULATORY_CARE_PROVIDER_SITE_OTHER): Payer: Medicare HMO | Admitting: Family Medicine

## 2017-01-14 ENCOUNTER — Other Ambulatory Visit: Payer: Self-pay | Admitting: Family Medicine

## 2017-01-14 DIAGNOSIS — J449 Chronic obstructive pulmonary disease, unspecified: Secondary | ICD-10-CM | POA: Diagnosis not present

## 2017-01-14 MED ORDER — VARENICLINE TARTRATE 1 MG PO TABS: 1.0000 mg | ORAL_TABLET | Freq: Two times a day (BID) | ORAL | 3 refills | Status: DC

## 2017-01-14 MED ORDER — AZITHROMYCIN 250 MG PO TABS: 250.0000 mg | ORAL_TABLET | Freq: Every day | ORAL | 0 refills | Status: DC

## 2017-01-14 MED ORDER — PREDNISONE 10 MG (48) PO TBPK: ORAL_TABLET | Freq: Every day | ORAL | 0 refills | Status: DC

## 2017-01-14 NOTE — Patient Instructions (Signed)
Thank you for coming in today.. Call or go to the emergency room if you get worse, have trouble breathing, have chest pains, or palpitations.   Chronic Obstructive Pulmonary Disease Chronic obstructive pulmonary disease (COPD) is a common lung condition in which airflow from the lungs is limited. COPD is a general term that can be used to describe many different lung problems that limit airflow, including both chronic bronchitis and emphysema. If you have COPD, your lung function will probably never return to normal, but there are measures you can take to improve lung function and make yourself feel better. What are the causes?  Smoking (common).  Exposure to secondhand smoke.  Genetic problems.  Chronic inflammatory lung diseases or recurrent infections. What are the signs or symptoms?  Shortness of breath, especially with physical activity.  Deep, persistent (chronic) cough with a large amount of thick mucus.  Wheezing.  Rapid breaths (tachypnea).  Gray or bluish discoloration (cyanosis) of the skin, especially in your fingers, toes, or lips.  Fatigue.  Weight loss.  Frequent infections or episodes when breathing symptoms become much worse (exacerbations).  Chest tightness. How is this diagnosed? Your health care provider will take a medical history and perform a physical examination to diagnose COPD. Additional tests for COPD may include:  Lung (pulmonary) function tests.  Chest X-ray.  CT scan.  Blood tests. How is this treated? Treatment for COPD may include:  Inhaler and nebulizer medicines. These help manage the symptoms of COPD and make your breathing more comfortable.  Supplemental oxygen. Supplemental oxygen is only helpful if you have a low oxygen level in your blood.  Exercise and physical activity. These are beneficial for nearly all people with COPD.  Lung surgery or transplant.  Nutrition therapy to gain weight, if you are underweight.  Pulmonary  rehabilitation. This may involve working with a team of health care providers and specialists, such as respiratory, occupational, and physical therapists. Follow these instructions at home:  Take all medicines (inhaled or pills) as directed by your health care provider.  Avoid over-the-counter medicines or cough syrups that dry up your airway (such as antihistamines) and slow down the elimination of secretions unless instructed otherwise by your health care provider.  If you are a smoker, the most important thing that you can do is stop smoking. Continuing to smoke will cause further lung damage and breathing trouble. Ask your health care provider for help with quitting smoking. He or she can direct you to community resources or hospitals that provide support.  Avoid exposure to irritants such as smoke, chemicals, and fumes that aggravate your breathing.  Use oxygen therapy and pulmonary rehabilitation if directed by your health care provider. If you require home oxygen therapy, ask your health care provider whether you should purchase a pulse oximeter to measure your oxygen level at home.  Avoid contact with individuals who have a contagious illness.  Avoid extreme temperature and humidity changes.  Eat healthy foods. Eating smaller, more frequent meals and resting before meals may help you maintain your strength.  Stay active, but balance activity with periods of rest. Exercise and physical activity will help you maintain your ability to do things you want to do.  Preventing infection and hospitalization is very important when you have COPD. Make sure to receive all the vaccines your health care provider recommends, especially the pneumococcal and influenza vaccines. Ask your health care provider whether you need a pneumonia vaccine.  Learn and use relaxation techniques to manage stress.  Learn and use controlled breathing techniques as directed by your health care provider. Controlled  breathing techniques include: 1. Pursed lip breathing. Start by breathing in (inhaling) through your nose for 1 second. Then, purse your lips as if you were going to whistle and breathe out (exhale) through the pursed lips for 2 seconds. 2. Diaphragmatic breathing. Start by putting one hand on your abdomen just above your waist. Inhale slowly through your nose. The hand on your abdomen should move out. Then purse your lips and exhale slowly. You should be able to feel the hand on your abdomen moving in as you exhale.  Learn and use controlled coughing to clear mucus from your lungs. Controlled coughing is a series of short, progressive coughs. The steps of controlled coughing are: 1. Lean your head slightly forward. 2. Breathe in deeply using diaphragmatic breathing. 3. Try to hold your breath for 3 seconds. 4. Keep your mouth slightly open while coughing twice. 5. Spit any mucus out into a tissue. 6. Rest and repeat the steps once or twice as needed. Contact a health care provider if:  You are coughing up more mucus than usual.  There is a change in the color or thickness of your mucus.  Your breathing is more labored than usual.  Your breathing is faster than usual. Get help right away if:  You have shortness of breath while you are resting.  You have shortness of breath that prevents you from:  Being able to talk.  Performing your usual physical activities.  You have chest pain lasting longer than 5 minutes.  Your skin color is more cyanotic than usual.  You measure low oxygen saturations for longer than 5 minutes with a pulse oximeter. This information is not intended to replace advice given to you by your health care provider. Make sure you discuss any questions you have with your health care provider. Document Released: 09/17/2005 Document Revised: 05/15/2016 Document Reviewed: 08/04/2013 Elsevier Interactive Patient Education  2017 [employer].

## 2017-01-14 NOTE — Telephone Encounter (Signed)
Patient advised of recommendations.  

## 2017-01-14 NOTE — Progress Notes (Signed)
Krystal Burgess is a 56 y.o. female who presents to [facility] today for shortness of breath cough congestion wheezing. Symptoms have been present for months but worsened over the last week. She's had intermittent respiratory symptoms off and on for over a month now. She was seen on December 22 for a COPD exacerbation and treated with prednisone. She notes she improved temporarily but her symptoms have returned. She denies any fevers or chills or chest pain. She notes she has started using Chantix and has cut back her smoking from 3 packs a day to 1-1/2 packs a day.   Past Medical History:  Diagnosis Date  . Allergy   . Hyperlipidemia   . Pain   . Thyroid disease    Past Surgical History:  Procedure Laterality Date  . ABDOMINAL HYSTERECTOMY    . CESAREAN SECTION    . CHOLECYSTECTOMY    . lipoma removal     RT shoulder  . LUMBAR SPINE SURGERY  2013   Dr. Manson Passey, L4-5  . pilonydal cyst  1994   Social History  Substance Use Topics  . Smoking status: Current Every Day Smoker    Packs/day: 2.50  . Smokeless tobacco: Not on file  . Alcohol use Not on file   family history includes Cancer in her brother; Depression in her brother and mother; Diabetes in her brother; Hyperlipidemia in her mother; Hypertension in her father and mother; Pancreatic cancer in her mother.  ROS as above:  Medications: Current Outpatient Prescriptions  Medication Sig Dispense Refill  . albuterol (PROVENTIL) (2.5 MG/3ML) 0.083% nebulizer solution Take 3 mLs (2.5 mg total) by nebulization every 6 (six) hours as needed for wheezing or shortness of breath. 150 mL 1  . albuterol (VENTOLIN HFA) 108 (90 Base) MCG/ACT inhaler Inhale 2 puffs into the lungs every 4 (four) hours as needed for wheezing or shortness of breath. 18 g 11  . amLODipine (NORVASC) 5 MG tablet Take 1 tablet (5 mg total) by  mouth daily. 90 tablet 3  . azithromycin (ZITHROMAX) 250 MG tablet Take 1 tablet (250 mg total) by mouth daily. Take first 2 tablets together, then 1 every day until finished. 6 tablet 0  . brimonidine-timolol (COMBIGAN) 0.2-0.5 % ophthalmic solution Place 2 drops into both eyes daily.    Marland Kitchen CARAFATE 1 GM/10ML suspension TAKE 10 MLS (1 G TOTAL) BY MOUTH 4 (FOUR) TIMES DAILY - WITH MEALS AND AT BEDTIME. 420 mL 0  . clonazePAM (KLONOPIN) 0.5 MG tablet TAKE 1/2 TO 1 TABLET TWICE A DAY AS NEEDED 45 tablet 0  . DULoxetine (CYMBALTA) 30 MG capsule TAKE 1 CAPSULE (30 MG TOTAL) BY MOUTH DAILY. 90 capsule 3  . DULoxetine (CYMBALTA) 60 MG capsule TAKE 1 CAPSULE (60 MG TOTAL) BY MOUTH DAILY. 90 capsule 3  . fexofenadine (ALLEGRA) 180 MG tablet TAKE 1 TABLET (180 MG TOTAL) BY MOUTH DAILY. 90 tablet 1  . fluticasone (FLONASE) 50 MCG/ACT nasal spray PUT 2 SPRAYS INTO EACH NOSTRIL DAILY. 48 g 3  . fluticasone furoate-vilanterol (BREO ELLIPTA) 200-25 MCG/INH AEPB Inhale 1 puff into the lungs daily. 90 each 3  . glucose blood (ACCU-CHEK AVIVA) test strip Check blood sugar twice daily/ Diagnosis Diabetes type 2 100 each prn  . HYDROcodone-acetaminophen (NORCO) 10-325 MG tablet Take 1 tablet by mouth every 6 (six) hours as needed. 120 tablet 0  . ipratropium-albuterol (DUONEB) 0.5-2.5 (3) MG/3ML SOLN USE 1 VIAL IN NEBULIZER  EVERY 2-3 HOURS. (6 BOXES) 540 mL 1  . ipratropium-albuterol (DUONEB) 0.5-2.5 (3) MG/3ML SOLN USE 1 VIAL IN NEBULIZER EVERY 2-3 HOURS. (6 BOXES) 540 mL 1  . Lancets (ACCU-CHEK SOFT TOUCH) lancets Check blood sugar twice daily/ Diagnosis Diabetes type 2 100 each prn  . levothyroxine (SYNTHROID, LEVOTHROID) 88 MCG tablet TAKE 1 TABLET (88 MCG TOTAL) BY MOUTH DAILY. 90 tablet 1  . losartan (COZAAR) 100 MG tablet TAKE 1 TABLET EVERY DAY -NEEDS APPT. FOR MORE REFILLS 90 tablet 1  . LYRICA 100 MG capsule TAKE ONE CAPSULE BY MOUTH TWICE A DAY 180 capsule 1  . methocarbamol (ROBAXIN) 500 MG tablet TAKE 1  TABLET 3 TIMES A DAY AS NEEDED 40 tablet 0  . nystatin (MYCOSTATIN) 100000 UNIT/ML suspension TAKE 1 TEASPOONFUL BY MOUTH 4 TIMES A DAY 120 mL 0  . pantoprazole (PROTONIX) 40 MG tablet TAKE 1 TABLET (40 MG TOTAL) BY MOUTH 2 (TWO) TIMES DAILY. 120 tablet 1  . potassium chloride (KLOR-CON) 10 MEQ CR tablet Take 10 mEq by mouth as needed.     . predniSONE (STERAPRED UNI-PAK 48 TAB) 10 MG (48) TBPK tablet Take by mouth daily. 12 day dosepack po 48 tablet 0  . promethazine (PHENERGAN) 25 MG tablet TAKE 1 TABLET EVERY 8 HOURS AS NEEDED FOR NAUSEA AND VOMITING 25 tablet 0  . QUEtiapine (SEROQUEL) 300 MG tablet TAKE 1 TABLET (300 MG TOTAL) BY MOUTH 2 (TWO) TIMES DAILY. 180 tablet 1  . rosuvastatin (CRESTOR) 20 MG tablet Take 1 tablet (20 mg total) by mouth daily. 90 tablet 1  . sitaGLIPtin (JANUVIA) 100 MG tablet Take 1 tablet (100 mg total) by mouth daily. 30 tablet 6  . sodium chloride 0.9 % nebulizer solution Take 3 mLs by nebulization as needed for wheezing. 90 mL 12  . SPIRIVA HANDIHALER 18 MCG inhalation capsule INHALE THE CONTENTS OF 1 CAPULE ONCE EVERY DAY 30 capsule 3  . theophylline (THEODUR) 300 MG 12 hr tablet TAKE 1 TABLET TWICE A DAY 60 tablet 3  . varenicline (CHANTIX) 1 MG tablet Take 1 tablet (1 mg total) by mouth 2 (two) times daily. 60 tablet 3  . zolpidem (AMBIEN) 10 MG tablet TAKE 1 TABLET AT BEDTIME 30 tablet 0   No current facility-administered medications for this visit.    Allergies  Allergen Reactions  . Aripiprazole     REACTION: muscle jerks  . Augmentin [Amoxicillin-Pot Clavulanate] Other (See Comments)    Gets yeast infection every time   . Fluoxetine Hcl     REACTION: Intolerant  . Geodon [Ziprasidone Hcl] Other (See Comments)    shakes  . Metformin And Related Nausea Only  . Nsaids Other (See Comments)    Upper GI bleed  . Onglyza [Saxagliptin] Nausea Only  . Paroxetine     REACTION: Intolerance    Health Maintenance Health Maintenance  Topic Date Due  .  PAP SMEAR  12/22/2010  . OPHTHALMOLOGY EXAM  09/26/2014  . HEMOGLOBIN A1C  08/11/2016  . PNEUMOCOCCAL POLYSACCHARIDE VACCINE (2) 10/13/2016  . TETANUS/TDAP  12/22/2016  . MAMMOGRAM  02/11/2017 (Originally 03/01/2013)  . FOOT EXAM  03/24/2017  . COLONOSCOPY  10/19/2024  . INFLUENZA VACCINE  Completed  . Hepatitis C Screening  Completed  . HIV Screening  Completed     Exam:  BP 123/72   Pulse 70   Temp 98.2 F (36.8 C) (Oral)   Wt 262 lb (118.8 kg)   SpO2 95%   BMI 49.50 kg/m  Gen:  Morbidly obese chronically unhealthy-appearing but in no acute distress today. HEENT: EOMI,  MMM Lungs: Normal work of breathing. Wheezing present bilaterally with prolonged expiratory phase Heart: RRR no MRG Abd: NABS, Soft. Nondistended, Nontender Exts: Brisk capillary refill, warm and well perfused. No pedal edema   No results found for this or any previous visit (from the past 72 hour(s)). No results found.    Assessment and Plan: 56 y.o. female with COPD exacerbation versus bronchitis. Patient has maximized her medical therapy for COPD at this time. She's having recurrent exacerbations. Plan to treat with longer 12 day course of prednisone as well as azithromycin antibiotics. Recommend follow-up with PCP. She may be a candidate for chronic steroids or possibly even oxygen therapy. I suspect she may desaturate with ambulation..  Fortunately she is interested in cutting back or quitting smoking and would like a refill of her Chantix today.   No orders of the defined types were placed in this encounter.   Discussed warning signs or symptoms. Please see discharge instructions. Patient expresses understanding.

## 2017-01-15 ENCOUNTER — Other Ambulatory Visit: Payer: Self-pay | Admitting: Family Medicine

## 2017-01-15 LAB — BASIC METABOLIC PANEL
CO2: 23 mmol/L (ref 20–31)
Calcium: 9.5 mg/dL (ref 8.6–10.4)
Chloride: 100 mmol/L (ref 98–110)
Creat: 1.29 mg/dL — ABNORMAL HIGH (ref 0.50–1.05)
Glucose, Bld: 73 mg/dL (ref 65–99)
Sodium: 138 mmol/L (ref 135–146)

## 2017-01-15 LAB — BASIC METABOLIC PANEL WITH GFR
BUN: 10 mg/dL (ref 7–25)
Potassium: 4.3 mmol/L (ref 3.5–5.3)

## 2017-01-16 ENCOUNTER — Ambulatory Visit (HOSPITAL_BASED_OUTPATIENT_CLINIC_OR_DEPARTMENT_OTHER): Admission: RE | Admit: 2017-01-16 | Payer: Medicare HMO | Source: Ambulatory Visit

## 2017-01-16 ENCOUNTER — Inpatient Hospital Stay (HOSPITAL_BASED_OUTPATIENT_CLINIC_OR_DEPARTMENT_OTHER): Admission: RE | Admit: 2017-01-16 | Payer: Medicare HMO | Source: Ambulatory Visit

## 2017-01-23 ENCOUNTER — Ambulatory Visit (HOSPITAL_BASED_OUTPATIENT_CLINIC_OR_DEPARTMENT_OTHER)
Admission: RE | Admit: 2017-01-23 | Discharge: 2017-01-23 | Disposition: A | Discharge status: Home / Self Care | Payer: Medicare HMO | Source: Ambulatory Visit | Attending: Sports Medicine | Admitting: Sports Medicine

## 2017-01-23 DIAGNOSIS — I2510 Atherosclerotic heart disease of native coronary artery without angina pectoris: Secondary | ICD-10-CM | POA: Insufficient documentation

## 2017-01-23 DIAGNOSIS — J9811 Atelectasis: Secondary | ICD-10-CM | POA: Insufficient documentation

## 2017-01-23 DIAGNOSIS — J45909 Unspecified asthma, uncomplicated: Secondary | ICD-10-CM | POA: Diagnosis not present

## 2017-01-23 DIAGNOSIS — J449 Chronic obstructive pulmonary disease, unspecified: Secondary | ICD-10-CM | POA: Diagnosis present

## 2017-01-23 MED ORDER — IOPAMIDOL (ISOVUE-300) INJECTION 61%
100.0000 mL | Freq: Once | INTRAVENOUS | Status: AC | PRN
  Administered 2017-01-23: 100 mL via INTRAVENOUS

## 2017-01-24 DIAGNOSIS — J449 Chronic obstructive pulmonary disease, unspecified: Secondary | ICD-10-CM | POA: Diagnosis not present

## 2017-01-26 ENCOUNTER — Encounter: Payer: Self-pay | Admitting: Sports Medicine

## 2017-01-26 DIAGNOSIS — R911 Solitary pulmonary nodule: Secondary | ICD-10-CM | POA: Insufficient documentation

## 2017-01-28 ENCOUNTER — Telehealth: Payer: Self-pay | Admitting: Family Medicine

## 2017-01-30 ENCOUNTER — Ambulatory Visit: Payer: Medicare HMO

## 2017-01-30 ENCOUNTER — Ambulatory Visit (INDEPENDENT_AMBULATORY_CARE_PROVIDER_SITE_OTHER): Payer: Medicare HMO | Admitting: Family Medicine

## 2017-01-30 VITALS — BP 146/90 | HR 106 | Ht 61.0 in | Wt 257.0 lb

## 2017-01-30 DIAGNOSIS — F99 Mental disorder, not otherwise specified: Secondary | ICD-10-CM

## 2017-01-30 DIAGNOSIS — M62838 Other muscle spasm: Secondary | ICD-10-CM

## 2017-01-30 DIAGNOSIS — E119 Type 2 diabetes mellitus without complications: Secondary | ICD-10-CM

## 2017-01-30 DIAGNOSIS — I10 Essential (primary) hypertension: Secondary | ICD-10-CM | POA: Diagnosis not present

## 2017-01-30 DIAGNOSIS — F5105 Insomnia due to other mental disorder: Secondary | ICD-10-CM | POA: Diagnosis not present

## 2017-01-30 DIAGNOSIS — F3175 Bipolar disorder, in partial remission, most recent episode depressed: Secondary | ICD-10-CM

## 2017-01-30 DIAGNOSIS — Z720 Tobacco use: Secondary | ICD-10-CM

## 2017-01-30 DIAGNOSIS — F5104 Psychophysiologic insomnia: Secondary | ICD-10-CM

## 2017-01-30 LAB — POCT UA - MICROALBUMIN
Albumin/Creatinine Ratio, Urine, POC: <30
Creatinine, POC: 300 mg/dL
Microalbumin Ur, POC: 30 mg/L

## 2017-01-30 LAB — POCT GLYCOSYLATED HEMOGLOBIN (HGB A1C): HEMOGLOBIN A1C: 5.6

## 2017-01-30 MED ORDER — ROSUVASTATIN CALCIUM 20 MG PO TABS: 20.0000 mg | ORAL_TABLET | Freq: Every day | ORAL | 1 refills | Status: DC

## 2017-01-30 MED ORDER — DOXEPIN HCL 10 MG PO CAPS: 10.0000 mg | ORAL_CAPSULE | Freq: Every day | ORAL | 5 refills | Status: DC

## 2017-01-30 MED ORDER — TIZANIDINE HCL 4 MG PO TABS: 4.0000 mg | ORAL_TABLET | Freq: Two times a day (BID) | ORAL | 1 refills | Status: DC | PRN

## 2017-01-30 MED ORDER — NAPROXEN 500 MG PO TABS: 500.0000 mg | ORAL_TABLET | Freq: Two times a day (BID) | ORAL | 5 refills | Status: DC

## 2017-01-30 NOTE — Telephone Encounter (Signed)
Appt for Krystal Burgess had to be canceled because sister could not wait that long.  WIll reschedule later.

## 2017-01-30 NOTE — Progress Notes (Signed)
Subjective:    CC: DM  HPI:  Diabetes - no hypoglycemic events. No wounds or sores that are not healing well. No increased thirst or urination. Checking glucose at home. Taking medications as prescribed without any side effects.  Bipolar disorder - Doing well overall but still having problems with sleep.    COPD - she has been feeling much better lately.  She said they actually had a leak in her ceiling in her trailer. And evidently it was growing mold for several months and she didn't realize this. It actually got neck and up in the leak has been repaired. Also her dog passed away recently. She knows that she is allergic to dogs and so plans on not getting another one.  Insomnia - says really wants her xanax back to help you sleep.  She says she knows I wanted her to see a therapist and counselor.    MSK pain with spinal stenosis of the lumbar region - Needs to change muscle relaxer. Insurance won't cover methacarbamol anymore. He actually has an appointment coming up with neurology she is excited because the be able to do her injections for her spine as well as manage her pain medication. We did not refill her Mobic and she's been having much more pain. She did try one of her mother's naproxen and felt like it worked well.  Tobacco abuse-currently on Chantix. She says she doesn't feel like it has affected her mood in any way.  Past medical history, Surgical history, Family history not pertinant except as noted below, Social history, Allergies, and medications have been entered into the medical record, reviewed, and corrections made.   Review of Systems: No fevers, chills, night sweats, weight loss, chest pain, or shortness of breath.   Objective:    General: Well Developed, well nourished, and in no acute distress.  Neuro: Alert and oriented x3, extra-ocular muscles intact, sensation grossly intact. Conjunctiva are clear.  HEENT: Normocephalic, atraumatic  Skin: Warm and dry, no  rashes. Cardiac: Regular rate and rhythm, no murmurs rubs or gallops, no lower extremity edema.  Respiratory: Clear to auscultation bilaterally. Not using accessory muscles, speaking in full sentences.   Impression and Recommendations:   DM- Well controlled. Continue current regimen. Follow up in  3-4 months.   Lab Results  Component Value Date   HGBA1C 5.8 02/12/2016     Bipolar disorder - stable.  COPD - improved with the able to eliminate mold in her bed frame and animal dander. Her lungs actually sound fantastic today. Continue current regimen.  Insomnia-we'll try a low dose of doxepin. Potential interaction with Seroquel but I would much rather try this and put her back on benzodiazepine. We'll start with 10 mg. If not effective then she will let me know. Take about half an hour before bedtime.  Spinal stenosis-neurology we will will be taking over her pain medications. She has an appointment next week I believe. We'll also switch her Mobic to naproxen for better cardiac safety data.  Tobacco abuse-she has been on Chantix and doing well with it. She says she is now down to one carton per week instead of 2 cartons per week. She does occasionally get some nausea though that she often takes it without food. Reminded her that she really needs to take it with food to avoid the nausea but she does have a prescription for Phenergan to use occasionally if needed.  Also noted to be some scattered coronary artery constipation seen on recent  chest CT. We discussed this and recommended that she take a statin regularly. She has a prescription for Crestor on her list but she said doesn't think she is actively taking it send a prescription was sent to the pharmacy today.  Lab Results  Component Value Date   CHOL 156 11/01/2015   HDL 43 (L) 11/01/2015   LDLCALC 68 11/01/2015   TRIG 227 (H) 11/01/2015   CHOLHDL 3.6 11/01/2015     Time spent 45 minutes, greater than 50% time spent counseling  about diabetes, COPD, insomnia, spinal stenosis and back pain and tobacco abuse.

## 2017-01-30 NOTE — Patient Instructions (Addendum)
We will try tizanidine instead of your methacarbamol.   Changed your mobic to naproxen. Make sure to take with food and water.   We care going to try Doxepin at bedtime for your sleep.

## 2017-02-05 ENCOUNTER — Other Ambulatory Visit: Payer: Self-pay | Admitting: Family Medicine

## 2017-02-05 DIAGNOSIS — G603 Idiopathic progressive neuropathy: Secondary | ICD-10-CM | POA: Diagnosis not present

## 2017-02-05 DIAGNOSIS — G5603 Carpal tunnel syndrome, bilateral upper limbs: Secondary | ICD-10-CM | POA: Diagnosis not present

## 2017-02-05 DIAGNOSIS — R51 Headache: Secondary | ICD-10-CM | POA: Diagnosis not present

## 2017-02-05 DIAGNOSIS — R202 Paresthesia of skin: Secondary | ICD-10-CM | POA: Diagnosis not present

## 2017-02-05 DIAGNOSIS — M5417 Radiculopathy, lumbosacral region: Secondary | ICD-10-CM | POA: Diagnosis not present

## 2017-02-05 DIAGNOSIS — M5412 Radiculopathy, cervical region: Secondary | ICD-10-CM | POA: Diagnosis not present

## 2017-02-05 DIAGNOSIS — M542 Cervicalgia: Secondary | ICD-10-CM | POA: Diagnosis not present

## 2017-02-05 DIAGNOSIS — Z79899 Other long term (current) drug therapy: Secondary | ICD-10-CM | POA: Diagnosis not present

## 2017-02-06 ENCOUNTER — Other Ambulatory Visit: Payer: Self-pay | Admitting: Family Medicine

## 2017-02-06 ENCOUNTER — Other Ambulatory Visit: Payer: Self-pay | Admitting: *Deleted

## 2017-02-07 DIAGNOSIS — J449 Chronic obstructive pulmonary disease, unspecified: Secondary | ICD-10-CM | POA: Diagnosis not present

## 2017-02-11 ENCOUNTER — Other Ambulatory Visit: Payer: Self-pay | Admitting: Family Medicine

## 2017-02-12 ENCOUNTER — Other Ambulatory Visit: Payer: Self-pay | Admitting: *Deleted

## 2017-02-12 MED ORDER — FLUTICASONE PROPIONATE 50 MCG/ACT NA SUSP: NASAL | 3 refills | Status: DC

## 2017-02-12 MED ORDER — PROMETHAZINE HCL 25 MG PO TABS: ORAL_TABLET | ORAL | 2 refills | Status: DC

## 2017-02-21 DIAGNOSIS — J449 Chronic obstructive pulmonary disease, unspecified: Secondary | ICD-10-CM | POA: Diagnosis not present

## 2017-03-04 ENCOUNTER — Ambulatory Visit: Payer: Medicare HMO

## 2017-03-05 ENCOUNTER — Ambulatory Visit: Payer: Medicare HMO

## 2017-03-05 ENCOUNTER — Ambulatory Visit (INDEPENDENT_AMBULATORY_CARE_PROVIDER_SITE_OTHER): Payer: Medicare HMO | Admitting: Family Medicine

## 2017-03-05 ENCOUNTER — Encounter: Payer: Self-pay | Admitting: Family Medicine

## 2017-03-05 VITALS — BP 117/65 | HR 99 | Ht 61.0 in | Wt 263.0 lb

## 2017-03-05 DIAGNOSIS — E65 Localized adiposity: Secondary | ICD-10-CM

## 2017-03-05 DIAGNOSIS — M5441 Lumbago with sciatica, right side: Secondary | ICD-10-CM | POA: Diagnosis not present

## 2017-03-05 DIAGNOSIS — G603 Idiopathic progressive neuropathy: Secondary | ICD-10-CM | POA: Diagnosis not present

## 2017-03-05 DIAGNOSIS — G5603 Carpal tunnel syndrome, bilateral upper limbs: Secondary | ICD-10-CM | POA: Diagnosis not present

## 2017-03-05 DIAGNOSIS — M5412 Radiculopathy, cervical region: Secondary | ICD-10-CM | POA: Diagnosis not present

## 2017-03-05 DIAGNOSIS — M5442 Lumbago with sciatica, left side: Secondary | ICD-10-CM | POA: Diagnosis not present

## 2017-03-05 DIAGNOSIS — J0100 Acute maxillary sinusitis, unspecified: Secondary | ICD-10-CM

## 2017-03-05 DIAGNOSIS — R202 Paresthesia of skin: Secondary | ICD-10-CM | POA: Diagnosis not present

## 2017-03-05 MED ORDER — PREDNISONE 20 MG PO TABS: 40.0000 mg | ORAL_TABLET | Freq: Every day | ORAL | 0 refills | Status: DC

## 2017-03-05 MED ORDER — DOXYCYCLINE HYCLATE 100 MG PO TABS: 100.0000 mg | ORAL_TABLET | Freq: Two times a day (BID) | ORAL | 0 refills | Status: DC

## 2017-03-05 NOTE — Progress Notes (Signed)
Subjective:    Patient ID: Krystal Burgess, female    DOB: Sep 06, 1961, 56 y.o.   MRN: [id number]  HPI 57 year old female comes in today complaining of sinus symptoms that began approximately 4 days ago on Monday. She denies any fevers chills or sweats or nausea and vomiting but she does have green mucus is seems to be coming out of the left nostril. She's also been using her nebulizer more frequently.    She would also like to be tested for Cushing's. She feels like she's had an extremely rapid weight gain in the last several years. Her brother was diagnosed with that before he passed away. She does have a buffalo hump on the top of her back at the base of her neck. She also has been expressing easy bruising. As well as history a. That she's been on and off prednisone over the last several years as well.  Review of Systems  BP 117/65   Pulse 99   Ht 5\' 1"  (1.549 m)   Wt 263 lb (119.3 kg)   SpO2 97% Comment: 4L  BMI 49.69 kg/m     Allergies  Allergen Reactions  . Aripiprazole     REACTION: muscle jerks  . Augmentin [Amoxicillin-Pot Clavulanate] Other (See Comments)    Gets yeast infection every time   . Fluoxetine Hcl     REACTION: Intolerant  . Geodon [Ziprasidone Hcl] Other (See Comments)    shakes  . Metformin And Related Nausea Only  . Nsaids Other (See Comments)    Upper GI bleed  . Onglyza [Saxagliptin] Nausea Only  . Paroxetine     REACTION: Intolerance    Past Medical History:  Diagnosis Date  . Allergy   . Hyperlipidemia   . Pain   . Thyroid disease     Past Surgical History:  Procedure Laterality Date  . ABDOMINAL HYSTERECTOMY    . CESAREAN SECTION    . CHOLECYSTECTOMY    . lipoma removal     RT shoulder  . LUMBAR SPINE SURGERY  2013   Dr. Manson Passey, L4-5  . pilonydal cyst  1994    Social History   Social History  . Marital status: Divorced    Spouse name: N/A  . Number of children: N/A  . Years of education: N/A   Occupational History  . Not  on file.   Social History Main Topics  . Smoking status: Current Every Day Smoker    Packs/day: 2.50  . Smokeless tobacco: Never Used  . Alcohol use Not on file  . Drug use: No  . Sexual activity: Not on file     Comment: s with mother currentlynemployed, on disabilty, divorced, liver   Other Topics Concern  . Not on file   Social History Narrative  . No narrative on file    Family History  Problem Relation Age of Onset  . Diabetes Brother   . Depression Brother     bipolar  . Cancer Brother     throat  . Diabetes      grandmother  . Depression Mother   . Hyperlipidemia Mother   . Hypertension Mother   . Hypertension Father   . Pancreatic cancer Mother     Outpatient Encounter Prescriptions as of 03/05/2017  Medication Sig  . albuterol (VENTOLIN HFA) 108 (90 Base) MCG/ACT inhaler Inhale 2 puffs into the lungs every 4 (four) hours as needed for wheezing or shortness of breath.  Marland Kitchen amLODipine (NORVASC) 5 MG tablet  Take 1 tablet (5 mg total) by mouth daily.  . brimonidine-timolol (COMBIGAN) 0.2-0.5 % ophthalmic solution Place 2 drops into both eyes daily.  Marland Kitchen CARAFATE 1 GM/10ML suspension TAKE 10 MLS (1 G TOTAL) BY MOUTH 4 (FOUR) TIMES DAILY - WITH MEALS AND AT BEDTIME.  Marland Kitchen doxepin (SINEQUAN) 10 MG capsule Take 1 capsule (10 mg total) by mouth at bedtime.  . DULoxetine (CYMBALTA) 30 MG capsule TAKE 1 CAPSULE (30 MG TOTAL) BY MOUTH DAILY.  . DULoxetine (CYMBALTA) 60 MG capsule TAKE 1 CAPSULE (60 MG TOTAL) BY MOUTH DAILY.  . fexofenadine (ALLEGRA) 180 MG tablet TAKE 1 TABLET (180 MG TOTAL) BY MOUTH DAILY.  . fluticasone (FLONASE) 50 MCG/ACT nasal spray PUT 2 SPRAYS INTO EACH NOSTRIL DAILY.  . fluticasone furoate-vilanterol (BREO ELLIPTA) 200-25 MCG/INH AEPB Inhale 1 puff into the lungs daily.  Marland Kitchen gabapentin (NEURONTIN) 300 MG capsule TAKE 1 TAB DAILY FOR 1 WK THEN 1 CAP TWICE A DAY FOR 1 WK THEN 1 CAP AM AND 2 CAPS PM  . glucose blood (ACCU-CHEK AVIVA) test strip Check blood  sugar twice daily/ Diagnosis Diabetes type 2  . ipratropium-albuterol (DUONEB) 0.5-2.5 (3) MG/3ML SOLN USE 1 VIAL IN NEBULIZER EVERY 2-3 HOURS. (6 BOXES)  . Lancets (ACCU-CHEK SOFT TOUCH) lancets Check blood sugar twice daily/ Diagnosis Diabetes type 2  . levothyroxine (SYNTHROID, LEVOTHROID) 88 MCG tablet TAKE 1 TABLET (88 MCG TOTAL) BY MOUTH DAILY.  Marland Kitchen losartan (COZAAR) 100 MG tablet TAKE 1 TABLET EVERY DAY -NEEDS APPT. FOR MORE REFILLS  . LYRICA 100 MG capsule TAKE ONE CAPSULE BY MOUTH TWICE A DAY  . naproxen (NAPROSYN) 500 MG tablet Take 1 tablet (500 mg total) by mouth 2 (two) times daily with a meal.  . nystatin (MYCOSTATIN) 100000 UNIT/ML suspension TAKE 1 TEASPOONFUL BY MOUTH 4 TIMES A DAY  . oxyCODONE-acetaminophen (PERCOCET) 7.5-325 MG tablet TAKE 1 TABLET BY MOUTH EVERY 6 HOURS AS NEEDED FOR 30 DAYS  . pantoprazole (PROTONIX) 40 MG tablet TAKE 1 TABLET (40 MG TOTAL) BY MOUTH 2 (TWO) TIMES DAILY.  Marland Kitchen potassium chloride (KLOR-CON) 10 MEQ CR tablet Take 10 mEq by mouth as needed.   . promethazine (PHENERGAN) 25 MG tablet TAKE 1 TABLET EVERY 8 HOURS AS NEEDED FOR NAUSEA AND VOMITING  . QUEtiapine (SEROQUEL) 300 MG tablet TAKE 1 TABLET (300 MG TOTAL) BY MOUTH 2 (TWO) TIMES DAILY.  . rosuvastatin (CRESTOR) 20 MG tablet Take 1 tablet (20 mg total) by mouth daily.  . sitaGLIPtin (JANUVIA) 100 MG tablet Take 1 tablet (100 mg total) by mouth daily.  . sodium chloride 0.9 % nebulizer solution Take 3 mLs by nebulization as needed for wheezing.  Marland Kitchen SPIRIVA HANDIHALER 18 MCG inhalation capsule INHALE THE CONTENTS OF 1 CAPULE ONCE EVERY DAY  . theophylline (THEODUR) 300 MG 12 hr tablet TAKE 1 TABLET TWICE A DAY  . tiZANidine (ZANAFLEX) 4 MG tablet Take 1 tablet (4 mg total) by mouth 2 (two) times daily as needed for muscle spasms.  . varenicline (CHANTIX) 1 MG tablet Take 1 tablet (1 mg total) by mouth 2 (two) times daily.  Marland Kitchen zolpidem (AMBIEN) 10 MG tablet TAKE 1 TABLET AT BEDTIME  . doxycycline  (VIBRA-TABS) 100 MG tablet Take 1 tablet (100 mg total) by mouth 2 (two) times daily.  . predniSONE (DELTASONE) 20 MG tablet Take 2 tablets (40 mg total) by mouth daily.  . [DISCONTINUED] albuterol (PROVENTIL) (2.5 MG/3ML) 0.083% nebulizer solution Take 3 mLs (2.5 mg total) by nebulization every 6 (six)  hours as needed for wheezing or shortness of breath.  . [DISCONTINUED] HYDROcodone-acetaminophen (NORCO) 10-325 MG tablet Take 1 tablet by mouth every 6 (six) hours as needed.   No facility-administered encounter medications on file as of 03/05/2017.          Objective:   Physical Exam  Constitutional: She is oriented to person, place, and time. She appears well-developed and well-nourished.  HENT:  Head: Normocephalic and atraumatic.  Right Ear: External ear normal.  Left Ear: External ear normal.  Nose: Nose normal.  Mouth/Throat: Oropharynx is clear and moist.  TMs and canals are clear.   Eyes: Conjunctivae and EOM are normal. Pupils are equal, round, and reactive to light.  Neck: Neck supple. No thyromegaly present.  Cardiovascular: Normal rate, regular rhythm and normal heart sounds.   Pulmonary/Chest: Effort normal and breath sounds normal. She has no wheezes.  Lymphadenopathy:    She has no cervical adenopathy.  Neurological: She is alert and oriented to person, place, and time.  Skin: Skin is warm and dry.  Psychiatric: She has a normal mood and affect.          Assessment & Plan:  Acute acute sinusitis-we'll go ahead and treat with doxycycline. Epiglottic on prednisone since she has had an increase in shortness of breath and wheezing the first couple days that she actually feels a little better yesterday and today as far as her chest symptoms are concerned. I prefer that she have the Pramosone on hand in case she feels like it's starting to come back and her chest.  Buffalo hump-says she was a family history I would like to go ahead and start a workup for Cushing  syndrome. She will actually be getting a steroid injection and 20 for joints later today as we will not be able to do a dexamethasone suppression test at this time.

## 2017-03-05 NOTE — Addendum Note (Signed)
Addended by: Nani Gasser D on: 03/05/2017 11:51 AM   Modules accepted: Orders

## 2017-03-07 DIAGNOSIS — J449 Chronic obstructive pulmonary disease, unspecified: Secondary | ICD-10-CM | POA: Diagnosis not present

## 2017-03-11 LAB — ACTH

## 2017-03-13 LAB — DHEA: DHEA: <20 ng/dL — ABNORMAL LOW (ref 102–1185)

## 2017-03-16 ENCOUNTER — Telehealth: Payer: Self-pay

## 2017-03-16 ENCOUNTER — Other Ambulatory Visit: Payer: Self-pay | Admitting: Family Medicine

## 2017-03-16 NOTE — Telephone Encounter (Signed)
Dr Karie Schwalbe ordered Virgel Bouquet 200-25 in December, the Pharmacy said they have received Breo 100-25 from you.  Please clarify the dosage the patient is to be taking.

## 2017-03-16 NOTE — Telephone Encounter (Signed)
Newest Rx is for 200.  She may have just had refills on the 100 left over.

## 2017-03-16 NOTE — Telephone Encounter (Signed)
Spoke with Earlene Plater at the pharmacy.

## 2017-03-24 DIAGNOSIS — J449 Chronic obstructive pulmonary disease, unspecified: Secondary | ICD-10-CM | POA: Diagnosis not present

## 2017-04-01 ENCOUNTER — Telehealth: Payer: Self-pay | Admitting: *Deleted

## 2017-04-01 NOTE — Telephone Encounter (Signed)
Patient left a message that she is having right breast pain and would like a mammogram. Called the patient for more information. She states she has a painful lump on the right breast. She says she has a hx of breast cysts. Denies breast discharge or redness. Advised patient that first the patient would need to be evaluated.transferred up front to be scheduled for an appointment

## 2017-04-02 ENCOUNTER — Ambulatory Visit (INDEPENDENT_AMBULATORY_CARE_PROVIDER_SITE_OTHER): Payer: Medicare HMO | Admitting: Family Medicine

## 2017-04-02 VITALS — BP 100/65 | HR 116 | Ht 61.0 in | Wt 264.0 lb

## 2017-04-02 DIAGNOSIS — Z79899 Other long term (current) drug therapy: Secondary | ICD-10-CM | POA: Diagnosis not present

## 2017-04-02 DIAGNOSIS — M5441 Lumbago with sciatica, right side: Secondary | ICD-10-CM | POA: Diagnosis not present

## 2017-04-02 DIAGNOSIS — G603 Idiopathic progressive neuropathy: Secondary | ICD-10-CM | POA: Diagnosis not present

## 2017-04-02 DIAGNOSIS — N644 Mastodynia: Secondary | ICD-10-CM

## 2017-04-02 DIAGNOSIS — G5603 Carpal tunnel syndrome, bilateral upper limbs: Secondary | ICD-10-CM | POA: Diagnosis not present

## 2017-04-02 DIAGNOSIS — M5412 Radiculopathy, cervical region: Secondary | ICD-10-CM | POA: Diagnosis not present

## 2017-04-02 DIAGNOSIS — M5442 Lumbago with sciatica, left side: Secondary | ICD-10-CM | POA: Diagnosis not present

## 2017-04-02 MED ORDER — CEFDINIR 300 MG PO CAPS: 300.0000 mg | ORAL_CAPSULE | Freq: Two times a day (BID) | ORAL | 0 refills | Status: DC

## 2017-04-02 NOTE — Patient Instructions (Signed)
Call after the weekend if you think you need a round of prednisone.

## 2017-04-02 NOTE — Progress Notes (Signed)
Subjective:    Patient ID: Krystal Burgess, female    DOB: March 08, 1961, 56 y.o.   MRN: [id number]  HPI 56 year old female comes in today complaining of right breast pain. Last mammogram was March 2014 per our records.She said she noticed it about 2 weeks ago. It's painful just approached the area. She has had a prior history of recurrent cysts. No family or personal history of breast cancer or ovarian cancer. She denies any known injury or trauma to the area.  Health Maintenance  Topic Date Due  . PAP SMEAR  12/22/2010  . MAMMOGRAM  03/01/2013  . OPHTHALMOLOGY EXAM  09/26/2014  . PNEUMOCOCCAL POLYSACCHARIDE VACCINE (2) 10/13/2016  . TETANUS/TDAP  12/22/2016  . FOOT EXAM  03/24/2017  . INFLUENZA VACCINE  07/22/2017  . HEMOGLOBIN A1C  07/30/2017  . COLONOSCOPY  10/19/2024  . Hepatitis C Screening  Completed  . HIV Screening  Completed     She also complains of sinus symptoms is been going on for about a week. It is Worse the last 3 days. She is getting green nasal mucus and a lot of congestion. She's had some increased cough and shortness of breath and some occasional wheezing. She is on chronic oxygen therapy. Is getting steroid injections done today.  Review of Systems   BP 100/65   Pulse (!) 116   Ht 5\' 1"  (1.549 m)   Wt 264 lb (119.7 kg)   BMI 49.88 kg/m     Allergies  Allergen Reactions  . Aripiprazole     REACTION: muscle jerks  . Augmentin [Amoxicillin-Pot Clavulanate] Other (See Comments)    Gets yeast infection every time   . Fluoxetine Hcl     REACTION: Intolerant  . Geodon [Ziprasidone Hcl] Other (See Comments)    shakes  . Metformin And Related Nausea Only  . Nsaids Other (See Comments)    Upper GI bleed  . Onglyza [Saxagliptin] Nausea Only  . Paroxetine     REACTION: Intolerance    Past Medical History:  Diagnosis Date  . Allergy   . Hyperlipidemia   . Pain   . Thyroid disease     Past Surgical History:  Procedure Laterality Date  . ABDOMINAL  HYSTERECTOMY    . CESAREAN SECTION    . CHOLECYSTECTOMY    . lipoma removal     RT shoulder  . LUMBAR SPINE SURGERY  2013   Dr. Manson Passey, L4-5  . pilonydal cyst  1994    Social History   Social History  . Marital status: Divorced    Spouse name: N/A  . Number of children: N/A  . Years of education: N/A   Occupational History  . Not on file.   Social History Main Topics  . Smoking status: Current Every Day Smoker    Packs/day: 2.50  . Smokeless tobacco: Never Used  . Alcohol use Not on file  . Drug use: No  . Sexual activity: Not on file     Comment: s with mother currentlynemployed, on disabilty, divorced, liver   Other Topics Concern  . Not on file   Social History Narrative  . No narrative on file    Family History  Problem Relation Age of Onset  . Diabetes Brother   . Depression Brother     bipolar  . Cancer Brother     throat  . Diabetes      grandmother  . Depression Mother   . Hyperlipidemia Mother   . Hypertension Mother   .  Hypertension Father   . Pancreatic cancer Mother     Outpatient Encounter Prescriptions as of 04/02/2017  Medication Sig  . albuterol (VENTOLIN HFA) 108 (90 Base) MCG/ACT inhaler Inhale 2 puffs into the lungs every 4 (four) hours as needed for wheezing or shortness of breath.  Marland Kitchen amLODipine (NORVASC) 5 MG tablet Take 1 tablet (5 mg total) by mouth daily.  . brimonidine-timolol (COMBIGAN) 0.2-0.5 % ophthalmic solution Place 2 drops into both eyes daily.  Marland Kitchen CARAFATE 1 GM/10ML suspension TAKE 10 MLS (1 G TOTAL) BY MOUTH 4 (FOUR) TIMES DAILY - WITH MEALS AND AT BEDTIME.  Marland Kitchen doxepin (SINEQUAN) 10 MG capsule Take 1 capsule (10 mg total) by mouth at bedtime.  . DULoxetine (CYMBALTA) 30 MG capsule TAKE 1 CAPSULE (30 MG TOTAL) BY MOUTH DAILY.  . DULoxetine (CYMBALTA) 60 MG capsule TAKE 1 CAPSULE (60 MG TOTAL) BY MOUTH DAILY.  . fexofenadine (ALLEGRA) 180 MG tablet TAKE 1 TABLET (180 MG TOTAL) BY MOUTH DAILY.  . fluticasone (FLONASE) 50  MCG/ACT nasal spray PUT 2 SPRAYS INTO EACH NOSTRIL DAILY.  . fluticasone furoate-vilanterol (BREO ELLIPTA) 200-25 MCG/INH AEPB Inhale 1 puff into the lungs daily.  Marland Kitchen gabapentin (NEURONTIN) 300 MG capsule TAKE 1 TAB DAILY FOR 1 WK THEN 1 CAP TWICE A DAY FOR 1 WK THEN 1 CAP AM AND 2 CAPS PM  . glucose blood (ACCU-CHEK AVIVA) test strip Check blood sugar twice daily/ Diagnosis Diabetes type 2  . ipratropium-albuterol (DUONEB) 0.5-2.5 (3) MG/3ML SOLN USE 1 VIAL IN NEBULIZER EVERY 2-3 HOURS. (6 BOXES)  . JANUVIA 100 MG tablet TAKE 1 TABLET EVERY DAY  . Lancets (ACCU-CHEK SOFT TOUCH) lancets Check blood sugar twice daily/ Diagnosis Diabetes type 2  . levothyroxine (SYNTHROID, LEVOTHROID) 88 MCG tablet TAKE 1 TABLET (88 MCG TOTAL) BY MOUTH DAILY.  Marland Kitchen losartan (COZAAR) 100 MG tablet TAKE 1 TABLET EVERY DAY -NEEDS APPT. FOR MORE REFILLS  . LYRICA 100 MG capsule TAKE ONE CAPSULE BY MOUTH TWICE A DAY  . naproxen (NAPROSYN) 500 MG tablet Take 1 tablet (500 mg total) by mouth 2 (two) times daily with a meal.  . oxyCODONE-acetaminophen (PERCOCET) 7.5-325 MG tablet TAKE 1 TABLET BY MOUTH EVERY 6 HOURS AS NEEDED FOR 30 DAYS  . pantoprazole (PROTONIX) 40 MG tablet TAKE 1 TABLET (40 MG TOTAL) BY MOUTH 2 (TWO) TIMES DAILY.  Marland Kitchen potassium chloride (KLOR-CON) 10 MEQ CR tablet Take 10 mEq by mouth as needed.   . promethazine (PHENERGAN) 25 MG tablet TAKE 1 TABLET EVERY 8 HOURS AS NEEDED FOR NAUSEA AND VOMITING  . QUEtiapine (SEROQUEL) 300 MG tablet TAKE 1 TABLET (300 MG TOTAL) BY MOUTH 2 (TWO) TIMES DAILY.  . rosuvastatin (CRESTOR) 20 MG tablet Take 1 tablet (20 mg total) by mouth daily.  . sodium chloride 0.9 % nebulizer solution Take 3 mLs by nebulization as needed for wheezing.  Marland Kitchen SPIRIVA HANDIHALER 18 MCG inhalation capsule INHALE THE CONTENTS OF 1 CAPULE ONCE EVERY DAY  . theophylline (THEODUR) 300 MG 12 hr tablet TAKE 1 TABLET TWICE A DAY  . tiZANidine (ZANAFLEX) 4 MG tablet Take 1 tablet (4 mg total) by mouth 2  (two) times daily as needed for muscle spasms.  . varenicline (CHANTIX) 1 MG tablet Take 1 tablet (1 mg total) by mouth 2 (two) times daily.  Marland Kitchen zolpidem (AMBIEN) 10 MG tablet TAKE 1 TABLET AT BEDTIME  . [DISCONTINUED] doxycycline (VIBRA-TABS) 100 MG tablet Take 1 tablet (100 mg total) by mouth 2 (two) times daily.  . [  DISCONTINUED] nystatin (MYCOSTATIN) 100000 UNIT/ML suspension TAKE 1 TEASPOONFUL BY MOUTH 4 TIMES A DAY  . [DISCONTINUED] predniSONE (DELTASONE) 20 MG tablet Take 2 tablets (40 mg total) by mouth daily.   No facility-administered encounter medications on file as of 04/02/2017.           Objective:   Physical Exam  Constitutional: She is oriented to person, place, and time. She appears well-developed and well-nourished.  HENT:  Head: Normocephalic and atraumatic.  Right Ear: External ear normal.  Left Ear: External ear normal.  Nose: Nose normal.  Mouth/Throat: Oropharynx is clear and moist.  TMs and canals are clear.   Eyes: Conjunctivae and EOM are normal. Pupils are equal, round, and reactive to light.  Neck: Neck supple. No thyromegaly present.  Cardiovascular: Normal rate, regular rhythm and normal heart sounds.   Pulmonary/Chest: Effort normal and breath sounds normal. She has no wheezes. Right breast exhibits tenderness. Right breast exhibits no inverted nipple, no mass, no nipple discharge and no skin change. Left breast exhibits no inverted nipple, no mass, no nipple discharge, no skin change and no tenderness. Breasts are symmetrical.    Lymphadenopathy:    She has no cervical adenopathy.  Neurological: She is alert and oriented to person, place, and time.  Skin: Skin is warm and dry.  Psychiatric: She has a normal mood and affect.        Assessment & Plan:  Right breast pain-We'll go ahead and refer for diagnostic bilateral mammogram since she is due for her regular screening in addition to an ultrasound for further evaluation  Acute sinusitis-we'll go  ahead and treat with Omnicef. She just had a round of doxycycline about 4 weeks ago. Call if not significantly better in one week.

## 2017-04-07 ENCOUNTER — Telehealth: Payer: Self-pay

## 2017-04-07 DIAGNOSIS — R062 Wheezing: Secondary | ICD-10-CM

## 2017-04-07 DIAGNOSIS — J449 Chronic obstructive pulmonary disease, unspecified: Secondary | ICD-10-CM | POA: Diagnosis not present

## 2017-04-07 NOTE — Telephone Encounter (Signed)
Ok, recommend chest xray if getting worse.

## 2017-04-07 NOTE — Telephone Encounter (Signed)
Pt informed of recommendations. CXR placed.Loralee Pacas [place]

## 2017-04-07 NOTE — Telephone Encounter (Signed)
Pt called to say that she has started wheezing, and the illness has moved into her chest.  Please advise.

## 2017-04-14 ENCOUNTER — Other Ambulatory Visit: Payer: Medicare HMO

## 2017-04-17 ENCOUNTER — Other Ambulatory Visit: Payer: Medicare HMO

## 2017-04-23 DIAGNOSIS — J449 Chronic obstructive pulmonary disease, unspecified: Secondary | ICD-10-CM | POA: Diagnosis not present

## 2017-04-24 ENCOUNTER — Other Ambulatory Visit: Payer: Medicare HMO

## 2017-04-24 ENCOUNTER — Ambulatory Visit
Admission: RE | Admit: 2017-04-24 | Discharge: 2017-04-24 | Disposition: A | Discharge status: Home / Self Care | Payer: Medicare HMO | Source: Ambulatory Visit | Attending: Family Medicine | Admitting: Family Medicine

## 2017-04-24 DIAGNOSIS — N6489 Other specified disorders of breast: Secondary | ICD-10-CM | POA: Diagnosis not present

## 2017-04-24 DIAGNOSIS — R928 Other abnormal and inconclusive findings on diagnostic imaging of breast: Secondary | ICD-10-CM | POA: Diagnosis not present

## 2017-04-24 DIAGNOSIS — N644 Mastodynia: Secondary | ICD-10-CM

## 2017-04-27 ENCOUNTER — Other Ambulatory Visit: Payer: Self-pay

## 2017-04-27 DIAGNOSIS — E119 Type 2 diabetes mellitus without complications: Secondary | ICD-10-CM

## 2017-04-27 MED ORDER — GLUCOSE BLOOD VI STRP: ORAL_STRIP | 99 refills | Status: DC

## 2017-04-27 MED ORDER — ACCU-CHEK SOFT TOUCH LANCETS MISC: 99 refills | Status: DC

## 2017-04-28 ENCOUNTER — Other Ambulatory Visit: Payer: Self-pay | Admitting: Family Medicine

## 2017-05-07 ENCOUNTER — Encounter: Payer: Self-pay | Admitting: Family Medicine

## 2017-05-07 ENCOUNTER — Ambulatory Visit (INDEPENDENT_AMBULATORY_CARE_PROVIDER_SITE_OTHER): Payer: Medicare HMO | Admitting: Family Medicine

## 2017-05-07 VITALS — BP 132/70 | HR 108 | Ht 61.0 in | Wt 266.0 lb

## 2017-05-07 DIAGNOSIS — R1312 Dysphagia, oropharyngeal phase: Secondary | ICD-10-CM

## 2017-05-07 DIAGNOSIS — J0110 Acute frontal sinusitis, unspecified: Secondary | ICD-10-CM

## 2017-05-07 DIAGNOSIS — J449 Chronic obstructive pulmonary disease, unspecified: Secondary | ICD-10-CM | POA: Diagnosis not present

## 2017-05-07 MED ORDER — PREDNISONE 20 MG PO TABS: 40.0000 mg | ORAL_TABLET | Freq: Every day | ORAL | 0 refills | Status: DC

## 2017-05-07 MED ORDER — CEFDINIR 300 MG PO CAPS: 300.0000 mg | ORAL_CAPSULE | Freq: Two times a day (BID) | ORAL | 0 refills | Status: DC

## 2017-05-07 NOTE — Addendum Note (Signed)
Addended by: Nani Gasser D on: 05/07/2017 04:08 PM   Modules accepted: Orders, Level of Service

## 2017-05-07 NOTE — Progress Notes (Addendum)
Subjective:    Patient ID: Krystal Burgess, female    DOB: 30-Apr-1961, 56 y.o.   MRN: [id number]  HPI 56 year old female with COPD with asthma and diabetes and chronic sinus infections comes in today with 4 days of of sinus congestion and cough with green sputum.  + wheezing. No fever, sweats, or chills.  Has been doing her albuterol in the AM.  Does a lot of pressure in her face and her 4 head.  She did want to let me know that her mother was recently diagnosed with metastatic cancer. They're not 100% clear on the primary. She has lesions in the bone, in her throat and in the breast tissue. She also has a suspicious lesion on the tail the pancreas.  Patient is concerned because she's been having some problem with swallowing. She says even swelling her own saliva can be uncomfortable. No pain or soreness. No choking or gagging.  Review of Systems  BP 132/70   Pulse (!) 108   Ht 5\' 1"  (1.549 m)   Wt 266 lb (120.7 kg)   SpO2 94% Comment: 2L  BMI 50.26 kg/m     Allergies  Allergen Reactions  . Aripiprazole     REACTION: muscle jerks  . Augmentin [Amoxicillin-Pot Clavulanate] Other (See Comments)    Gets yeast infection every time   . Fluoxetine Hcl     REACTION: Intolerant  . Geodon [Ziprasidone Hcl] Other (See Comments)    shakes  . Metformin And Related Nausea Only  . Nsaids Other (See Comments)    Upper GI bleed  . Onglyza [Saxagliptin] Nausea Only  . Paroxetine     REACTION: Intolerance    Past Medical History:  Diagnosis Date  . Allergy   . Hyperlipidemia   . Pain   . Thyroid disease     Past Surgical History:  Procedure Laterality Date  . ABDOMINAL HYSTERECTOMY    . CESAREAN SECTION    . CHOLECYSTECTOMY    . lipoma removal     RT shoulder  . LUMBAR SPINE SURGERY  2013   Dr. Manson Passey, L4-5  . pilonydal cyst  1994    Social History   Social History  . Marital status: Divorced    Spouse name: N/A  . Number of children: N/A  . Years of education: N/A    Occupational History  . Not on file.   Social History Main Topics  . Smoking status: Current Every Day Smoker    Packs/day: 2.50  . Smokeless tobacco: Never Used  . Alcohol use Not on file  . Drug use: No  . Sexual activity: Not on file     Comment: s with mother currentlynemployed, on disabilty, divorced, liver   Other Topics Concern  . Not on file   Social History Narrative  . No narrative on file    Family History  Problem Relation Age of Onset  . Diabetes Brother   . Depression Brother        bipolar  . Cancer Brother        throat  . Diabetes Unknown        grandmother  . Depression Mother   . Hyperlipidemia Mother   . Hypertension Mother   . Pancreatic cancer Mother   . Hypertension Father     Outpatient Encounter Prescriptions as of 05/07/2017  Medication Sig  . albuterol (VENTOLIN HFA) 108 (90 Base) MCG/ACT inhaler Inhale 2 puffs into the lungs every 4 (four) hours as needed  for wheezing or shortness of breath.  Marland Kitchen amLODipine (NORVASC) 5 MG tablet Take 1 tablet (5 mg total) by mouth daily.  . brimonidine-timolol (COMBIGAN) 0.2-0.5 % ophthalmic solution Place 2 drops into both eyes daily.  Marland Kitchen CARAFATE 1 GM/10ML suspension TAKE 10 MLS (1 G TOTAL) BY MOUTH 4 (FOUR) TIMES DAILY - WITH MEALS AND AT BEDTIME.  . cefdinir (OMNICEF) 300 MG capsule Take 1 capsule (300 mg total) by mouth 2 (two) times daily.  Marland Kitchen doxepin (SINEQUAN) 10 MG capsule Take 1 capsule (10 mg total) by mouth at bedtime.  . DULoxetine (CYMBALTA) 30 MG capsule TAKE 1 CAPSULE (30 MG TOTAL) BY MOUTH DAILY.  . DULoxetine (CYMBALTA) 60 MG capsule TAKE 1 CAPSULE (60 MG TOTAL) BY MOUTH DAILY.  . fexofenadine (ALLEGRA) 180 MG tablet TAKE 1 TABLET (180 MG TOTAL) BY MOUTH DAILY.  . fluticasone (FLONASE) 50 MCG/ACT nasal spray PUT 2 SPRAYS INTO EACH NOSTRIL DAILY.  . fluticasone furoate-vilanterol (BREO ELLIPTA) 200-25 MCG/INH AEPB Inhale 1 puff into the lungs daily.  Marland Kitchen gabapentin (NEURONTIN) 300 MG capsule  TAKE 1 TAB DAILY FOR 1 WK THEN 1 CAP TWICE A DAY FOR 1 WK THEN 1 CAP AM AND 2 CAPS PM  . glucose blood (ACCU-CHEK AVIVA) test strip Check blood sugar twice daily/ Diagnosis Diabetes type 2: E11.9  . ipratropium-albuterol (DUONEB) 0.5-2.5 (3) MG/3ML SOLN USE 1 VIAL IN NEBULIZER EVERY 2-3 HOURS. (6 BOXES)  . JANUVIA 100 MG tablet TAKE 1 TABLET EVERY DAY  . Lancets (ACCU-CHEK SOFT TOUCH) lancets Check blood sugar twice daily/ Diagnosis Diabetes type 2: E11.9  . levothyroxine (SYNTHROID, LEVOTHROID) 88 MCG tablet TAKE 1 TABLET (88 MCG TOTAL) BY MOUTH DAILY.  Marland Kitchen losartan (COZAAR) 100 MG tablet TAKE 1 TABLET EVERY DAY -NEEDS APPT. FOR MORE REFILLS  . LYRICA 100 MG capsule TAKE ONE CAPSULE BY MOUTH TWICE A DAY  . naproxen (NAPROSYN) 500 MG tablet Take 1 tablet (500 mg total) by mouth 2 (two) times daily with a meal.  . oxyCODONE-acetaminophen (PERCOCET) 7.5-325 MG tablet TAKE 1 TABLET BY MOUTH EVERY 6 HOURS AS NEEDED FOR 30 DAYS  . pantoprazole (PROTONIX) 40 MG tablet TAKE 1 TABLET (40 MG TOTAL) BY MOUTH 2 (TWO) TIMES DAILY.  Marland Kitchen predniSONE (DELTASONE) 20 MG tablet Take 2 tablets (40 mg total) by mouth daily.  . promethazine (PHENERGAN) 25 MG tablet TAKE 1 TABLET EVERY 8 HOURS AS NEEDED FOR NAUSEA AND VOMITING  . QUEtiapine (SEROQUEL) 300 MG tablet TAKE 1 TABLET (300 MG TOTAL) BY MOUTH 2 (TWO) TIMES DAILY.  . rosuvastatin (CRESTOR) 20 MG tablet Take 1 tablet (20 mg total) by mouth daily.  . sodium chloride 0.9 % nebulizer solution Take 3 mLs by nebulization as needed for wheezing.  Marland Kitchen SPIRIVA HANDIHALER 18 MCG inhalation capsule INHALE THE CONTENTS OF 1 CAPULE ONCE EVERY DAY  . theophylline (THEODUR) 300 MG 12 hr tablet TAKE 1 TABLET TWICE A DAY  . tiZANidine (ZANAFLEX) 4 MG tablet Take 1 tablet (4 mg total) by mouth 2 (two) times daily as needed for muscle spasms.  . varenicline (CHANTIX) 1 MG tablet Take 1 tablet (1 mg total) by mouth 2 (two) times daily.  . [DISCONTINUED] cefdinir (OMNICEF) 300 MG  capsule Take 1 capsule (300 mg total) by mouth 2 (two) times daily.  . [DISCONTINUED] potassium chloride (KLOR-CON) 10 MEQ CR tablet Take 10 mEq by mouth as needed.   . [DISCONTINUED] zolpidem (AMBIEN) 10 MG tablet TAKE 1 TABLET AT BEDTIME   No facility-administered encounter  medications on file as of 05/07/2017.          Objective:   Physical Exam  Constitutional: She is oriented to person, place, and time. She appears well-developed and well-nourished.  HENT:  Head: Normocephalic and atraumatic.  Right Ear: External ear normal.  Left Ear: External ear normal.  Nose: Nose normal.  Mouth/Throat: Oropharynx is clear and moist.  TMs and canals are clear.   Eyes: Conjunctivae and EOM are normal. Pupils are equal, round, and reactive to light.  Neck: Neck supple. No thyromegaly present.  Cardiovascular: Normal rate, regular rhythm and normal heart sounds.   Pulmonary/Chest: Effort normal and breath sounds normal. She has no wheezes.  Lymphadenopathy:    She has no cervical adenopathy.  Neurological: She is alert and oriented to person, place, and time.  Skin: Skin is warm and dry.  Psychiatric: She has a normal mood and affect.          Assessment & Plan:  Acute sinusitis - Will treat with Omnicef. If not significantly better after one week and please let us know. This oftentimes turns into a COPD exacerbation for her symptom going to give her prednisone to only fill if she feels like she is getting worsening of her breathing in the next couple of days.  COPD-continue to use albuterol as needed. Certainly if she feels like she is getting worse with her breathing she can go ahead and fill the prednisone.  Dysphagia, or pharyngeal phase-we'll refer to ENT for further evaluation. She has a strong family history of throat cancer and she's been a longtime smoker herself. In addition she uses inhalers frequently so she could also have something that could be very easily treatable such as  thrush.

## 2017-05-12 ENCOUNTER — Ambulatory Visit (HOSPITAL_COMMUNITY): Payer: Self-pay | Admitting: Psychiatry

## 2017-05-13 ENCOUNTER — Other Ambulatory Visit: Payer: Self-pay | Admitting: Family Medicine

## 2017-05-19 ENCOUNTER — Other Ambulatory Visit: Payer: Self-pay | Admitting: Family Medicine

## 2017-05-21 ENCOUNTER — Other Ambulatory Visit: Payer: Self-pay | Admitting: Family Medicine

## 2017-05-24 DIAGNOSIS — J449 Chronic obstructive pulmonary disease, unspecified: Secondary | ICD-10-CM | POA: Diagnosis not present

## 2017-06-03 ENCOUNTER — Other Ambulatory Visit: Payer: Self-pay | Admitting: Family Medicine

## 2017-06-07 DIAGNOSIS — J449 Chronic obstructive pulmonary disease, unspecified: Secondary | ICD-10-CM | POA: Diagnosis not present

## 2017-06-14 ENCOUNTER — Other Ambulatory Visit: Payer: Self-pay | Admitting: Family Medicine

## 2017-06-18 ENCOUNTER — Other Ambulatory Visit: Payer: Self-pay | Admitting: Family Medicine

## 2017-06-20 ENCOUNTER — Other Ambulatory Visit: Payer: Self-pay | Admitting: Family Medicine

## 2017-06-23 ENCOUNTER — Other Ambulatory Visit: Payer: Self-pay | Admitting: Family Medicine

## 2017-06-23 DIAGNOSIS — J449 Chronic obstructive pulmonary disease, unspecified: Secondary | ICD-10-CM | POA: Diagnosis not present

## 2017-06-24 ENCOUNTER — Other Ambulatory Visit: Payer: Self-pay | Admitting: Family Medicine

## 2017-07-07 DIAGNOSIS — J449 Chronic obstructive pulmonary disease, unspecified: Secondary | ICD-10-CM | POA: Diagnosis not present

## 2017-07-15 ENCOUNTER — Other Ambulatory Visit: Payer: Self-pay | Admitting: Family Medicine

## 2017-07-15 ENCOUNTER — Other Ambulatory Visit: Payer: Self-pay | Admitting: *Deleted

## 2017-07-23 DIAGNOSIS — M5412 Radiculopathy, cervical region: Secondary | ICD-10-CM | POA: Diagnosis not present

## 2017-07-23 DIAGNOSIS — R202 Paresthesia of skin: Secondary | ICD-10-CM | POA: Diagnosis not present

## 2017-07-23 DIAGNOSIS — G5603 Carpal tunnel syndrome, bilateral upper limbs: Secondary | ICD-10-CM | POA: Diagnosis not present

## 2017-07-23 DIAGNOSIS — M5442 Lumbago with sciatica, left side: Secondary | ICD-10-CM | POA: Diagnosis not present

## 2017-07-23 DIAGNOSIS — M5441 Lumbago with sciatica, right side: Secondary | ICD-10-CM | POA: Diagnosis not present

## 2017-07-23 DIAGNOSIS — G603 Idiopathic progressive neuropathy: Secondary | ICD-10-CM | POA: Diagnosis not present

## 2017-07-24 DIAGNOSIS — J449 Chronic obstructive pulmonary disease, unspecified: Secondary | ICD-10-CM | POA: Diagnosis not present

## 2017-08-07 DIAGNOSIS — J449 Chronic obstructive pulmonary disease, unspecified: Secondary | ICD-10-CM | POA: Diagnosis not present

## 2017-08-18 ENCOUNTER — Other Ambulatory Visit: Payer: Self-pay | Admitting: Family Medicine

## 2017-08-18 DIAGNOSIS — F99 Mental disorder, not otherwise specified: Principal | ICD-10-CM

## 2017-08-18 DIAGNOSIS — F5105 Insomnia due to other mental disorder: Secondary | ICD-10-CM

## 2017-08-19 ENCOUNTER — Other Ambulatory Visit: Payer: Self-pay | Admitting: Family Medicine

## 2017-08-19 ENCOUNTER — Telehealth: Payer: Self-pay | Admitting: Family Medicine

## 2017-08-19 MED ORDER — UMECLIDINIUM BROMIDE 62.5 MCG/INH IN AEPB: 1.0000 | INHALATION_SPRAY | Freq: Every day | RESPIRATORY_TRACT | 11 refills | Status: DC

## 2017-08-19 NOTE — Telephone Encounter (Signed)
Call patient: Received notification from [facility] that we will need to substitute a different medication for her Spiriva which is no longer covered by her insurance. We'll switch to Incruse lip to.

## 2017-08-20 ENCOUNTER — Other Ambulatory Visit: Payer: Self-pay | Admitting: Family Medicine

## 2017-08-21 ENCOUNTER — Other Ambulatory Visit: Payer: Self-pay | Admitting: Family Medicine

## 2017-08-24 DIAGNOSIS — J449 Chronic obstructive pulmonary disease, unspecified: Secondary | ICD-10-CM | POA: Diagnosis not present

## 2017-08-25 ENCOUNTER — Telehealth: Payer: Self-pay | Admitting: Family Medicine

## 2017-08-25 NOTE — Telephone Encounter (Signed)
I called pt and left a voicemail stating she needs to call and schedule a F/u appt with Dr.Metheney

## 2017-09-07 DIAGNOSIS — J449 Chronic obstructive pulmonary disease, unspecified: Secondary | ICD-10-CM | POA: Diagnosis not present

## 2017-09-09 ENCOUNTER — Encounter: Payer: Self-pay | Admitting: Physician Assistant

## 2017-09-09 ENCOUNTER — Other Ambulatory Visit: Payer: Self-pay | Admitting: Family Medicine

## 2017-09-09 ENCOUNTER — Ambulatory Visit (INDEPENDENT_AMBULATORY_CARE_PROVIDER_SITE_OTHER): Payer: Medicare HMO | Admitting: Physician Assistant

## 2017-09-09 VITALS — BP 146/75 | Temp 99.0°F | Wt 269.0 lb

## 2017-09-09 DIAGNOSIS — J449 Chronic obstructive pulmonary disease, unspecified: Secondary | ICD-10-CM | POA: Diagnosis not present

## 2017-09-09 DIAGNOSIS — J441 Chronic obstructive pulmonary disease with (acute) exacerbation: Secondary | ICD-10-CM

## 2017-09-09 MED ORDER — IPRATROPIUM BROMIDE 0.06 % NA SOLN: 2.0000 | Freq: Four times a day (QID) | NASAL | 12 refills | Status: DC

## 2017-09-09 MED ORDER — PREDNISONE 20 MG PO TABS: ORAL_TABLET | ORAL | 0 refills | Status: DC

## 2017-09-09 MED ORDER — DOXYCYCLINE HYCLATE 100 MG PO TABS: 100.0000 mg | ORAL_TABLET | Freq: Two times a day (BID) | ORAL | 0 refills | Status: DC

## 2017-09-09 MED ORDER — FLUTICASONE FUROATE-VILANTEROL 200-25 MCG/INH IN AEPB: 1.0000 | INHALATION_SPRAY | Freq: Every day | RESPIRATORY_TRACT | 0 refills | Status: DC

## 2017-09-09 MED ORDER — METHYLPREDNISOLONE SODIUM SUCC 125 MG IJ SOLR
125.0000 mg | Freq: Once | INTRAMUSCULAR | Status: AC
  Administered 2017-09-09: 125 mg via INTRAMUSCULAR

## 2017-09-09 NOTE — Progress Notes (Signed)
Subjective:    Patient ID: Krystal Burgess, female    DOB: [birth year], 56 y.o.   MRN: [id number]  HPI  Pt is a 56 yo obese female with COPD who presents to the clinic with cough and SOB. She is a current smoker. She is also on 4L of O2 when ambulating. Her daughter who lives with her has been sick and dx with bronchitis. She started feeling bad 3 days ago but wanted to come in before symptoms got worse. Denies any ear pain, sinus pressure, ST. She has felt like she has ran a fever and had chills. She is taking tylenol cold/sinus severe, she continues on BREO and incruse, she is using duoneb every 2-4 hours and on oxygen.   .. Active Ambulatory Problems    Diagnosis Date Noted  . Hypothyroidism 02/20/2009  . UNSPECIFIED VITAMIN D DEFICIENCY 07/13/2009  . HYPERLIPIDEMIA 12/12/2008  . Severe obesity (BMI >= 40) (HCC) 05/03/2009  . SMOKER 12/12/2008  . DEPRESSION 12/12/2008  . NEUROPATHY 12/12/2008  . HYPERTENSION, BENIGN 03/26/2009  . ALLERGIC RHINITIS 08/17/2009  . 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 COPD with asthma (HCC) 12/12/2008  . Spinal stenosis of lumbar region 12/12/2008  . LEG EDEMA, BILATERAL 03/26/2009  . Vitamin B 12 deficiency 06/24/2011  . 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 Bipolar disorder (HCC) 01/24/2013  . Bilateral carpal tunnel syndrome 03/14/2013  . Acute angle-closure glaucoma 10/03/2013  . Diabetes type 2, controlled (HCC) 09/07/2014  . Lumbar degenerative disc disease 08/14/2016  . Pulmonary nodule, RML, needs noncontrast CT in February 2019 01/26/2017  . Chronic insomnia 01/30/2017  . COPD exacerbation (HCC) 09/09/2017   Resolved Ambulatory Problems    Diagnosis Date Noted  . HYPOKALEMIA 12/12/2008  . ANKLE PAIN, LEFT 02/05/2009  . SINUSITIS 02/20/2009   Past Medical History:  Diagnosis Date  . Allergy   . Hyperlipidemia   . Pain   . Thyroid disease      Review of Systems  All other systems reviewed and are negative.       Objective:   Physical Exam  Constitutional: She is oriented to person, place, and time. She appears well-developed and well-nourished.  Obese.   HENT:  Head: Normocephalic and atraumatic.  Right Ear: External ear normal.  Left Ear: External ear normal.  Nose: Nose normal.  Mouth/Throat: Oropharynx is clear and moist. No oropharyngeal exudate.  TM"s clear.  Negative for any sinus pressure to palpation.   Eyes: Conjunctivae are normal. Right eye exhibits no discharge. Left eye exhibits no discharge.  Neck: Normal range of motion. Neck supple. No thyromegaly present.  Cardiovascular: Normal rate, regular rhythm and normal heart sounds.   Pulmonary/Chest:  Decreased effort.  Wheezing and rhonchi bilateral lungs.   Lymphadenopathy:    She has no cervical adenopathy.  Neurological: She is alert and oriented to person, place, and time.  Psychiatric: She has a normal mood and affect. Her behavior is normal.          Assessment & Plan:  Marland KitchenMarland KitchenEllakate was seen today for uri.  Diagnoses and all orders for this visit:  COPD exacerbation (HCC) -     ipratropium (ATROVENT) 0.06 % nasal spray; Place 2 sprays into both nostrils 4 (four) times daily. -     predniSONE (DELTASONE) 20 MG tablet; Take 3 tablets for 3 days, take 2 tablets for 3 days, take 1 tablet for 3 days, take 1/2 tablet for 4 days. -     doxycycline (VIBRA-TABS) 100 MG tablet;  Take 1 tablet (100 mg total) by mouth 2 (two) times daily. For 10 days. -     methylPREDNISolone sodium succinate (SOLU-MEDROL) 125 mg/2 mL injection 125 mg; Inject 2 mLs (125 mg total) into the muscle once.  COPD with asthma (HCC) -     fluticasone furoate-vilanterol (BREO ELLIPTA) 200-25 MCG/INH AEPB; Inhale 1 puff into the lungs daily.   Discussed symptomatic care. Follow up as needed.

## 2017-09-09 NOTE — Patient Instructions (Signed)
Chronic Obstructive Pulmonary Disease Exacerbation  Chronic obstructive pulmonary disease (COPD) is a common lung problem. In COPD, the flow of air from the lungs is limited. COPD exacerbations are times that breathing gets worse and you need extra treatment. Without treatment they can be life threatening. If they happen often, your lungs can become more damaged. If your COPD gets worse, your doctor may treat you with:  ? Medicines.  ? Oxygen.  ? Different ways to clear your airway, such as using a mask.    Follow these instructions at home:  ? Do not smoke.  ? Avoid tobacco smoke and other things that bother your lungs.  ? If given, take your antibiotic medicine as told. Finish the medicine even if you start to feel better.  ? Only take medicines as told by your doctor.  ? Drink enough fluids to keep your pee (urine) clear or pale yellow (unless your doctor has told you not to).  ? Use a cool mist machine (vaporizer).  ? If you use oxygen or a machine that turns liquid medicine into a mist (nebulizer), continue to use them as told.  ? Keep up with shots (vaccinations) as told by your doctor.  ? Exercise regularly.  ? Eat healthy foods.  ? Keep all doctor visits as told.  Get help right away if:  ? You are very short of breath and it gets worse.  ? You have trouble talking.  ? You have bad chest pain.  ? You have blood in your spit (sputum).  ? You have a fever.  ? You keep throwing up (vomiting).  ? You feel weak, or you pass out (faint).  ? You feel confused.  ? You keep getting worse.  This information is not intended to replace advice given to you by your health care provider. Make sure you discuss any questions you have with your health care provider.  Document Released: 11/27/2011 Document Revised: 05/15/2016 Document Reviewed: 08/12/2013  Elsevier Interactive Patient Education ? 2017 [employer] Inc[employer]

## 2017-09-17 ENCOUNTER — Ambulatory Visit (INDEPENDENT_AMBULATORY_CARE_PROVIDER_SITE_OTHER): Payer: Medicare HMO | Admitting: Family Medicine

## 2017-09-17 ENCOUNTER — Encounter: Payer: Self-pay | Admitting: Family Medicine

## 2017-09-17 VITALS — BP 184/84 | HR 109 | Temp 99.2°F | Wt 267.0 lb

## 2017-09-17 DIAGNOSIS — G5603 Carpal tunnel syndrome, bilateral upper limbs: Secondary | ICD-10-CM | POA: Diagnosis not present

## 2017-09-17 DIAGNOSIS — M5442 Lumbago with sciatica, left side: Secondary | ICD-10-CM | POA: Diagnosis not present

## 2017-09-17 DIAGNOSIS — E119 Type 2 diabetes mellitus without complications: Secondary | ICD-10-CM | POA: Diagnosis not present

## 2017-09-17 DIAGNOSIS — J4551 Severe persistent asthma with (acute) exacerbation: Secondary | ICD-10-CM | POA: Diagnosis not present

## 2017-09-17 DIAGNOSIS — M5441 Lumbago with sciatica, right side: Secondary | ICD-10-CM | POA: Diagnosis not present

## 2017-09-17 DIAGNOSIS — J449 Chronic obstructive pulmonary disease, unspecified: Secondary | ICD-10-CM | POA: Diagnosis not present

## 2017-09-17 DIAGNOSIS — I10 Essential (primary) hypertension: Secondary | ICD-10-CM | POA: Diagnosis not present

## 2017-09-17 DIAGNOSIS — Z23 Encounter for immunization: Secondary | ICD-10-CM

## 2017-09-17 DIAGNOSIS — M5412 Radiculopathy, cervical region: Secondary | ICD-10-CM | POA: Diagnosis not present

## 2017-09-17 DIAGNOSIS — G603 Idiopathic progressive neuropathy: Secondary | ICD-10-CM | POA: Diagnosis not present

## 2017-09-17 DIAGNOSIS — Z79899 Other long term (current) drug therapy: Secondary | ICD-10-CM | POA: Diagnosis not present

## 2017-09-17 LAB — POCT GLYCOSYLATED HEMOGLOBIN (HGB A1C): Hemoglobin A1C: 6.1

## 2017-09-17 MED ORDER — METHYLPREDNISOLONE SODIUM SUCC 125 MG IJ SOLR
125.0000 mg | Freq: Once | INTRAMUSCULAR | Status: AC
  Administered 2017-09-17: 125 mg via INTRAMUSCULAR

## 2017-09-17 MED ORDER — ROSUVASTATIN CALCIUM 20 MG PO TABS: 20.0000 mg | ORAL_TABLET | Freq: Every day | ORAL | 1 refills | Status: DC

## 2017-09-17 MED ORDER — PREDNISONE 20 MG PO TABS: 40.0000 mg | ORAL_TABLET | Freq: Every day | ORAL | 0 refills | Status: DC

## 2017-09-17 MED ORDER — AZITHROMYCIN 250 MG PO TABS: ORAL_TABLET | ORAL | 0 refills | Status: AC

## 2017-09-17 NOTE — Progress Notes (Signed)
Subjective:    CC: HTN, DM, COPD  HPI:  Hypertension- Pt denies chest pain, SOB, dizziness, or heart palpitations.  Taking meds as directed w/o problems.  Denies medication side effects.    Diabetes - no hypoglycemic events. No wounds or sores that are not healing well. No increased thirst or urination. Checking glucose at home. Taking medications as prescribed without any side effects.  COPD with asthma - She was actually seen for COPD exacerbation about 10 days ago by one of my partners. She was treated with prednisone and doxycycline and given Solu-Medrol IM here in the office. She is currently using Incruse daily but not the Breo. She thought they were the same thing so stopped the [place] about a month ago.  .  Past medical history, Surgical history, Family history not pertinant except as noted below, Social history, Allergies, and medications have been entered into the medical record, reviewed, and corrections made.   Review of Systems: No fevers, chills, night sweats, weight loss.   Objective:    General: Well Developed, well nourished, and in no acute distress.  Neuro: Alert and oriented x3, extra-ocular muscles intact, sensation grossly intact.  HEENT: Normocephalic, atraumatic  Skin: Warm and dry, no rashes. Cardiac: Regular rate and rhythm, no murmurs rubs or gallops, no lower extremity edema.  Respiratory: Clear to auscultation bilaterally. Not using accessory muscles, speaking in full sentences.   Impression and Recommendations:    HTN - Blood pressure uncontrolled today even on repeat. That she's also not feeling well.*Follow-up in 1-2 weeks for nurse blood pressure check.  DM - Well controlled. Continue current regimen. Follow-up in 3-4 months. Lab Results  Component Value Date   HGBA1C 6.1 09/17/2017    COPD - Exacerbation-restart Breo. For the short-term we'll give her another injection of Solu-Medrol and put her on azithromycin.F/U in 2 weeks.Also encouraged her to  check with her insurance to see if they will cover Trelegy.  We'll give her 10 days of prednisone this time. 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 I like to see her back in a few weeks to make sure that she is improving.

## 2017-09-17 NOTE — Patient Instructions (Addendum)
Restart your [employer].   Use your albuterol every 4 hours for the next couple of days. Once feeling better okay to decrease down to every 6 hours for couple days and then slowly weaned down. See if insurance will cover Trelegy Ellipta. This would take the place of your Incruse and Breo. It is an all in one.

## 2017-09-23 DIAGNOSIS — J449 Chronic obstructive pulmonary disease, unspecified: Secondary | ICD-10-CM | POA: Diagnosis not present

## 2017-09-28 ENCOUNTER — Other Ambulatory Visit: Payer: Self-pay | Admitting: Family Medicine

## 2017-10-02 ENCOUNTER — Other Ambulatory Visit: Payer: Self-pay | Admitting: Family Medicine

## 2017-10-07 DIAGNOSIS — J449 Chronic obstructive pulmonary disease, unspecified: Secondary | ICD-10-CM | POA: Diagnosis not present

## 2017-10-08 ENCOUNTER — Other Ambulatory Visit: Payer: Self-pay | Admitting: Family Medicine

## 2017-10-09 ENCOUNTER — Telehealth: Payer: Self-pay

## 2017-10-09 NOTE — Telephone Encounter (Signed)
Pt stated that she talked with her insurance company, and they will cover her trelegy ellippta.  [facility] [address]

## 2017-10-18 ENCOUNTER — Other Ambulatory Visit: Payer: Self-pay | Admitting: Family Medicine

## 2017-10-20 ENCOUNTER — Ambulatory Visit (INDEPENDENT_AMBULATORY_CARE_PROVIDER_SITE_OTHER): Payer: Medicare HMO | Admitting: Family Medicine

## 2017-10-20 ENCOUNTER — Encounter: Payer: Self-pay | Admitting: Family Medicine

## 2017-10-20 VITALS — BP 143/71 | HR 111 | Wt 273.0 lb

## 2017-10-20 DIAGNOSIS — R0602 Shortness of breath: Secondary | ICD-10-CM

## 2017-10-20 DIAGNOSIS — S21001A Unspecified open wound of right breast, initial encounter: Secondary | ICD-10-CM

## 2017-10-20 DIAGNOSIS — E611 Iron deficiency: Secondary | ICD-10-CM | POA: Diagnosis not present

## 2017-10-20 DIAGNOSIS — R609 Edema, unspecified: Secondary | ICD-10-CM | POA: Diagnosis not present

## 2017-10-20 MED ORDER — FUROSEMIDE 20 MG PO TABS: 20.0000 mg | ORAL_TABLET | Freq: Every day | ORAL | 0 refills | Status: DC

## 2017-10-20 MED ORDER — FUROSEMIDE 10 MG/ML IJ SOLN
40.0000 mg | Freq: Once | INTRAMUSCULAR | Status: AC
  Administered 2017-10-20: 40 mg via INTRAMUSCULAR

## 2017-10-20 MED ORDER — TIZANIDINE HCL 4 MG PO TABS: 4.0000 mg | ORAL_TABLET | Freq: Two times a day (BID) | ORAL | 0 refills | Status: DC | PRN

## 2017-10-20 NOTE — Progress Notes (Signed)
Subjective:    Patient ID: Krystal Burgess, female    DOB: 1961/12/14, 56 y.o.   MRN: [id number]  HPI 56 year old female comes in today for shortness of breath and lower extremity swelling.  Out 4 weeks ago and she had uncontrolled blood pressure at the time.  At asked that she come back in 1-2 weeks for nurse blood pressure check but she was unable to return.  Is also experiencing a COPD exacerbation at that time and was given azithromycin as well as Solu-Medrol injection and started on Breo.  She did a neb tx around noon.  She has had swelling in her face and left leg. She has pitting edema.  She is up 6 lbs.  Her oxygen dropped to 94%.     Has a lesion on her right breast.  She said about 2 weeks ago she had an abscess and so she worse the lesion herself.  It drained a lot within the skin peeled off the surface.  She now has an open wound.  It is tender but no fevers chills or sweats.   Review of Systems  BP (!) 143/71   Pulse (!) 111   Wt 273 lb (123.8 kg)   SpO2 94%   BMI 51.58 kg/m     Allergies  Allergen Reactions  . Aripiprazole     REACTION: muscle jerks  . Augmentin [Amoxicillin-Pot Clavulanate] Other (See Comments)    Gets yeast infection every time   . Fluoxetine Hcl     REACTION: Intolerant  . Geodon [Ziprasidone Hcl] Other (See Comments)    shakes  . Metformin And Related Nausea Only  . Nsaids Other (See Comments)    Upper GI bleed  . Onglyza [Saxagliptin] Nausea Only  . Paroxetine     REACTION: Intolerance    Past Medical History:  Diagnosis Date  . Allergy   . Hyperlipidemia   . Pain   . Thyroid disease     Past Surgical History:  Procedure Laterality Date  . ABDOMINAL HYSTERECTOMY    . CESAREAN SECTION    . CHOLECYSTECTOMY    . lipoma removal     RT shoulder  . LUMBAR SPINE SURGERY  2013   Dr. Manson PasseyBrown, L4-5  . pilonydal cyst  1994    Social History   Social History  . Marital status: Divorced    Spouse name: N/A  . Number of children:  N/A  . Years of education: N/A   Occupational History  . Not on file.   Social History Main Topics  . Smoking status: Current Every Day Smoker    Packs/day: 2.50  . Smokeless tobacco: Never Used  . Alcohol use Not on file  . Drug use: No  . Sexual activity: Not on file     Comment: s with mother currentlynemployed, on disabilty, divorced, liver   Other Topics Concern  . Not on file   Social History Narrative  . No narrative on file    Family History  Problem Relation Age of Onset  . Diabetes Brother   . Depression Brother        bipolar  . Cancer Brother        throat  . Diabetes Unknown        grandmother  . Depression Mother   . Hyperlipidemia Mother   . Hypertension Mother   . Pancreatic cancer Mother   . Hypertension Father     Outpatient Encounter Prescriptions as of 10/20/2017  Medication Sig  .  albuterol (VENTOLIN HFA) 108 (90 Base) MCG/ACT inhaler Inhale 2 puffs into the lungs every 4 (four) hours as needed for wheezing or shortness of breath.  Marland Kitchen amLODipine (NORVASC) 5 MG tablet Take 1 tablet (5 mg total) by mouth daily.  . brimonidine-timolol (COMBIGAN) 0.2-0.5 % ophthalmic solution Place 2 drops into both eyes daily.  Marland Kitchen CARAFATE 1 GM/10ML suspension TAKE 10 MLS (1 G TOTAL) BY MOUTH 4 (FOUR) TIMES DAILY - WITH MEALS AND AT BEDTIME.  Marland Kitchen doxepin (SINEQUAN) 10 MG capsule TAKE 1 CAPSULE (10 MG TOTAL) BY MOUTH AT BEDTIME.  . DULoxetine (CYMBALTA) 30 MG capsule TAKE ONE CAPSULE EVERY DAY  . DULoxetine (CYMBALTA) 60 MG capsule TAKE 1 CAPSULE (60 MG TOTAL) BY MOUTH DAILY.  . fluticasone (FLONASE) 50 MCG/ACT nasal spray PUT 2 SPRAYS INTO EACH NOSTRIL DAILY.  . fluticasone furoate-vilanterol (BREO ELLIPTA) 200-25 MCG/INH AEPB Inhale 1 puff into the lungs daily.  Marland Kitchen gabapentin (NEURONTIN) 300 MG capsule TAKE 1 TAB DAILY FOR 1 WK THEN 1 CAP TWICE A DAY FOR 1 WK THEN 1 CAP AM AND 2 CAPS PM  . glucose blood (ACCU-CHEK AVIVA) test strip Check blood sugar twice daily/  Diagnosis Diabetes type 2: E11.9  . ipratropium (ATROVENT) 0.06 % nasal spray Place 2 sprays into both nostrils 4 (four) times daily.  Marland Kitchen ipratropium-albuterol (DUONEB) 0.5-2.5 (3) MG/3ML SOLN USE 1 VIAL IN NEBULIZER EVERY 2 TO 3 HOURS  . JANUVIA 100 MG tablet TAKE 1 TABLET EVERY DAY  . Lancets (ACCU-CHEK SOFT TOUCH) lancets Check blood sugar twice daily/ Diagnosis Diabetes type 2: E11.9  . levothyroxine (SYNTHROID, LEVOTHROID) 88 MCG tablet TAKE 1 TABLET (88 MCG TOTAL) BY MOUTH DAILY. APPOINTMENT REQUIRED FOR REFILLS  . losartan (COZAAR) 100 MG tablet TAKE 1 TABLET EVERY DAY -NEEDS APPT. FOR MORE REFILLS  . naproxen (NAPROSYN) 500 MG tablet Take 1 tablet (500 mg total) by mouth 2 (two) times daily with a meal.  . nystatin (MYCOSTATIN) 100000 UNIT/ML suspension TAKE 1 TEASPOON BY MOUTH FOUR TIMES A DAY  . oxyCODONE-acetaminophen (PERCOCET) 7.5-325 MG tablet TAKE 1 TABLET BY MOUTH EVERY 6 HOURS AS NEEDED FOR 30 DAYS  . pantoprazole (PROTONIX) 40 MG tablet TAKE 1 TABLET (40 MG TOTAL) BY MOUTH 2 (TWO) TIMES DAILY.  Marland Kitchen QUEtiapine (SEROQUEL) 300 MG tablet TAKE 1 TABLET (300 MG TOTAL) BY MOUTH 2 (TWO) TIMES DAILY.  . rosuvastatin (CRESTOR) 20 MG tablet Take 1 tablet (20 mg total) by mouth daily.  . sodium chloride 0.9 % nebulizer solution Take 3 mLs by nebulization as needed for wheezing.  . theophylline (THEODUR) 300 MG 12 hr tablet TAKE 1 TABLET TWICE A DAY  . tiZANidine (ZANAFLEX) 4 MG tablet Take 1 tablet (4 mg total) by mouth 2 (two) times daily as needed for muscle spasms. APPOINTMENT REQUIRED FOR REFILLS  . umeclidinium bromide (INCRUSE ELLIPTA) 62.5 MCG/INH AEPB Inhale 1 puff into the lungs daily.  Marland Kitchen UNABLE TO FIND   . [DISCONTINUED] predniSONE (DELTASONE) 20 MG tablet Take 2 tablets (40 mg total) by mouth daily. X 5 days, then 1 tab daily x 5 days.  . [DISCONTINUED] SPIRIVA HANDIHALER 18 MCG inhalation capsule   . [EXPIRED] furosemide (LASIX) injection 40 mg    No facility-administered  encounter medications on file as of 10/20/2017.          Objective:   Physical Exam  Constitutional: She is oriented to person, place, and time. She appears well-developed and well-nourished.  HENT:  Head: Normocephalic and atraumatic.  Cardiovascular: Normal  rate, regular rhythm and normal heart sounds.   Pulmonary/Chest: Effort normal and breath sounds normal.    .25 x 2.5 cm open wound of the right outer breast.  Some serous drainage.  Mild erythema around the border.  Neurological: She is alert and oriented to person, place, and time.  Skin: Skin is warm and dry.  3.25 x 2.5 cm open wound of the right outer breast.  Some serous drainage.  Mild erythema around the border.   Psychiatric: She has a normal mood and affect. Her behavior is normal.        Assessment & Plan:   Right open wound on the right breast -Xeroform applied with nonstick gauze and then taped down over the area.  Gave her some additional Xeroform to change the bandage every 48 hours at home.  I will see her back on Monday to make sure that she is improving.  Edema -she feels like the edema is more diffuse.  Her face is more swollen as well as her hands and her feet.  It seems to be pretty symmetric on both sides though.  Given her IM 40 mg Lasix here in the office and then will send over Lasix for her to start taking daily for the next couple days and then follow-up on Monday.  We will check some lab work just to rule out renal, liver disease as well as a BNP to rule out heart failure.

## 2017-10-21 ENCOUNTER — Other Ambulatory Visit: Payer: Self-pay | Admitting: Family Medicine

## 2017-10-22 ENCOUNTER — Other Ambulatory Visit: Payer: Self-pay | Admitting: *Deleted

## 2017-10-22 LAB — URINALYSIS, ROUTINE W REFLEX MICROSCOPIC
Bilirubin Urine: NEGATIVE
Glucose, UA: NEGATIVE
Hgb urine dipstick: NEGATIVE
KETONES UR: NEGATIVE
Leukocytes, UA: NEGATIVE
NITRITE: NEGATIVE
Protein, ur: NEGATIVE
SPECIFIC GRAVITY, URINE: 1.006 (ref 1.001–1.03)

## 2017-10-22 LAB — COMPLETE METABOLIC PANEL WITH GFR
AG RATIO: 1.5 (calc) (ref 1.0–2.5)
ALT: 17 U/L (ref 6–29)
AST: 12 U/L (ref 10–35)
Albumin: 3.7 g/dL (ref 3.6–5.1)
Alkaline phosphatase (APISO): 88 U/L (ref 33–130)
BUN/Creatinine Ratio: 9 (calc) (ref 6–22)
BUN: 12 mg/dL (ref 7–25)
CALCIUM: 8.9 mg/dL (ref 8.6–10.4)
CO2: 28 mmol/L (ref 20–32)
CREATININE: 1.38 mg/dL — AB (ref 0.50–1.05)
Chloride: 98 mmol/L (ref 98–110)
GFR, EST AFRICAN AMERICAN: 50 mL/min/{1.73_m2} — AB (ref >=60)
GFR, EST NON AFRICAN AMERICAN: 43 mL/min/{1.73_m2} — AB (ref >=60)
GLOBULIN: 2.4 g/dL (ref 1.9–3.7)
GLUCOSE: 88 mg/dL (ref 65–99)
POTASSIUM: 3.7 mmol/L (ref 3.5–5.3)
Sodium: 136 mmol/L (ref 135–146)
TOTAL PROTEIN: 6.1 g/dL (ref 6.1–8.1)
Total Bilirubin: 0.2 mg/dL (ref 0.2–1.2)

## 2017-10-22 LAB — CBC
HCT: 33.3 % — ABNORMAL LOW (ref 35.0–45.0)
Hemoglobin: 11.1 g/dL — ABNORMAL LOW (ref 11.7–15.5)
MCH: 30.2 pg (ref 27.0–33.0)
MCHC: 33.3 g/dL (ref 32.0–36.0)
MCV: 90.7 fL (ref 80.0–100.0)
MPV: 10.2 fL (ref 7.5–12.5)
PLATELETS: 320 10*3/uL (ref 140–400)
RBC: 3.67 10*6/uL — ABNORMAL LOW (ref 3.80–5.10)
RDW: 15 % (ref 11.0–15.0)
WBC: 17 10*3/uL — AB (ref 3.8–10.8)

## 2017-10-22 LAB — TSH: TSH: 7.06 m[IU]/L — AB

## 2017-10-22 LAB — IRON: IRON: 56 ug/dL (ref 45–160)

## 2017-10-22 LAB — CORTISOL: Cortisol, Plasma: 1.8 ug/dL — ABNORMAL LOW

## 2017-10-22 LAB — FERRITIN: FERRITIN: 13 ng/mL (ref 10–232)

## 2017-10-22 MED ORDER — GLUCOSE BLOOD VI STRP: ORAL_STRIP | 12 refills | Status: DC

## 2017-10-22 MED ORDER — AMBULATORY NON FORMULARY MEDICATION: 0 refills | Status: DC

## 2017-10-23 ENCOUNTER — Ambulatory Visit: Payer: Self-pay | Admitting: Family Medicine

## 2017-10-23 DIAGNOSIS — I10 Essential (primary) hypertension: Secondary | ICD-10-CM | POA: Diagnosis not present

## 2017-10-23 DIAGNOSIS — H409 Unspecified glaucoma: Secondary | ICD-10-CM | POA: Insufficient documentation

## 2017-10-23 DIAGNOSIS — I129 Hypertensive chronic kidney disease with stage 1 through stage 4 chronic kidney disease, or unspecified chronic kidney disease: Secondary | ICD-10-CM | POA: Diagnosis not present

## 2017-10-23 DIAGNOSIS — R031 Nonspecific low blood-pressure reading: Secondary | ICD-10-CM | POA: Diagnosis not present

## 2017-10-23 DIAGNOSIS — R52 Pain, unspecified: Secondary | ICD-10-CM | POA: Diagnosis not present

## 2017-10-23 DIAGNOSIS — B372 Candidiasis of skin and nail: Secondary | ICD-10-CM | POA: Diagnosis not present

## 2017-10-23 DIAGNOSIS — R600 Localized edema: Secondary | ICD-10-CM | POA: Diagnosis not present

## 2017-10-23 DIAGNOSIS — K219 Gastro-esophageal reflux disease without esophagitis: Secondary | ICD-10-CM | POA: Diagnosis not present

## 2017-10-23 DIAGNOSIS — J9611 Chronic respiratory failure with hypoxia: Secondary | ICD-10-CM | POA: Diagnosis not present

## 2017-10-23 DIAGNOSIS — Z720 Tobacco use: Secondary | ICD-10-CM | POA: Diagnosis not present

## 2017-10-23 DIAGNOSIS — F329 Major depressive disorder, single episode, unspecified: Secondary | ICD-10-CM | POA: Diagnosis not present

## 2017-10-23 DIAGNOSIS — E785 Hyperlipidemia, unspecified: Secondary | ICD-10-CM | POA: Diagnosis not present

## 2017-10-23 DIAGNOSIS — Z9981 Dependence on supplemental oxygen: Secondary | ICD-10-CM | POA: Diagnosis not present

## 2017-10-23 DIAGNOSIS — E119 Type 2 diabetes mellitus without complications: Secondary | ICD-10-CM | POA: Diagnosis not present

## 2017-10-23 DIAGNOSIS — A419 Sepsis, unspecified organism: Secondary | ICD-10-CM | POA: Diagnosis not present

## 2017-10-23 DIAGNOSIS — R1032 Left lower quadrant pain: Secondary | ICD-10-CM | POA: Diagnosis not present

## 2017-10-23 DIAGNOSIS — B3789 Other sites of candidiasis: Secondary | ICD-10-CM | POA: Diagnosis not present

## 2017-10-23 DIAGNOSIS — R509 Fever, unspecified: Secondary | ICD-10-CM | POA: Diagnosis not present

## 2017-10-23 DIAGNOSIS — R0602 Shortness of breath: Secondary | ICD-10-CM | POA: Diagnosis not present

## 2017-10-23 DIAGNOSIS — N179 Acute kidney failure, unspecified: Secondary | ICD-10-CM | POA: Diagnosis not present

## 2017-10-23 DIAGNOSIS — R079 Chest pain, unspecified: Secondary | ICD-10-CM | POA: Diagnosis not present

## 2017-10-23 DIAGNOSIS — J181 Lobar pneumonia, unspecified organism: Secondary | ICD-10-CM | POA: Diagnosis not present

## 2017-10-23 DIAGNOSIS — Z6841 Body Mass Index (BMI) 40.0 and over, adult: Secondary | ICD-10-CM | POA: Diagnosis not present

## 2017-10-23 DIAGNOSIS — J441 Chronic obstructive pulmonary disease with (acute) exacerbation: Secondary | ICD-10-CM | POA: Diagnosis not present

## 2017-10-23 DIAGNOSIS — E1122 Type 2 diabetes mellitus with diabetic chronic kidney disease: Secondary | ICD-10-CM | POA: Diagnosis not present

## 2017-10-24 DIAGNOSIS — A419 Sepsis, unspecified organism: Secondary | ICD-10-CM | POA: Diagnosis not present

## 2017-10-24 DIAGNOSIS — N179 Acute kidney failure, unspecified: Secondary | ICD-10-CM | POA: Diagnosis not present

## 2017-10-24 DIAGNOSIS — J181 Lobar pneumonia, unspecified organism: Secondary | ICD-10-CM | POA: Diagnosis not present

## 2017-10-24 DIAGNOSIS — R0600 Dyspnea, unspecified: Secondary | ICD-10-CM | POA: Diagnosis not present

## 2017-10-24 DIAGNOSIS — R1032 Left lower quadrant pain: Secondary | ICD-10-CM | POA: Diagnosis not present

## 2017-10-24 DIAGNOSIS — H409 Unspecified glaucoma: Secondary | ICD-10-CM | POA: Diagnosis not present

## 2017-10-24 DIAGNOSIS — J9611 Chronic respiratory failure with hypoxia: Secondary | ICD-10-CM | POA: Diagnosis not present

## 2017-10-24 DIAGNOSIS — R918 Other nonspecific abnormal finding of lung field: Secondary | ICD-10-CM | POA: Diagnosis not present

## 2017-10-24 DIAGNOSIS — K219 Gastro-esophageal reflux disease without esophagitis: Secondary | ICD-10-CM | POA: Diagnosis not present

## 2017-10-24 DIAGNOSIS — D649 Anemia, unspecified: Secondary | ICD-10-CM | POA: Diagnosis not present

## 2017-10-24 DIAGNOSIS — B372 Candidiasis of skin and nail: Secondary | ICD-10-CM | POA: Insufficient documentation

## 2017-10-24 DIAGNOSIS — R079 Chest pain, unspecified: Secondary | ICD-10-CM | POA: Diagnosis not present

## 2017-10-24 DIAGNOSIS — J189 Pneumonia, unspecified organism: Secondary | ICD-10-CM | POA: Insufficient documentation

## 2017-10-24 DIAGNOSIS — G8929 Other chronic pain: Secondary | ICD-10-CM | POA: Insufficient documentation

## 2017-10-24 DIAGNOSIS — J984 Other disorders of lung: Secondary | ICD-10-CM | POA: Diagnosis not present

## 2017-10-24 DIAGNOSIS — N3289 Other specified disorders of bladder: Secondary | ICD-10-CM | POA: Diagnosis not present

## 2017-10-24 DIAGNOSIS — J449 Chronic obstructive pulmonary disease, unspecified: Secondary | ICD-10-CM | POA: Diagnosis not present

## 2017-10-24 DIAGNOSIS — E039 Hypothyroidism, unspecified: Secondary | ICD-10-CM | POA: Diagnosis not present

## 2017-10-24 DIAGNOSIS — J441 Chronic obstructive pulmonary disease with (acute) exacerbation: Secondary | ICD-10-CM | POA: Diagnosis not present

## 2017-10-24 DIAGNOSIS — I10 Essential (primary) hypertension: Secondary | ICD-10-CM | POA: Diagnosis not present

## 2017-10-25 DIAGNOSIS — J181 Lobar pneumonia, unspecified organism: Secondary | ICD-10-CM | POA: Diagnosis not present

## 2017-10-25 DIAGNOSIS — J9611 Chronic respiratory failure with hypoxia: Secondary | ICD-10-CM | POA: Diagnosis not present

## 2017-10-25 DIAGNOSIS — E039 Hypothyroidism, unspecified: Secondary | ICD-10-CM | POA: Diagnosis not present

## 2017-10-25 DIAGNOSIS — J441 Chronic obstructive pulmonary disease with (acute) exacerbation: Secondary | ICD-10-CM | POA: Diagnosis not present

## 2017-10-25 DIAGNOSIS — H409 Unspecified glaucoma: Secondary | ICD-10-CM | POA: Diagnosis not present

## 2017-10-25 DIAGNOSIS — I10 Essential (primary) hypertension: Secondary | ICD-10-CM | POA: Diagnosis not present

## 2017-10-25 DIAGNOSIS — K219 Gastro-esophageal reflux disease without esophagitis: Secondary | ICD-10-CM | POA: Diagnosis not present

## 2017-10-25 DIAGNOSIS — D649 Anemia, unspecified: Secondary | ICD-10-CM | POA: Diagnosis not present

## 2017-10-25 DIAGNOSIS — N179 Acute kidney failure, unspecified: Secondary | ICD-10-CM | POA: Diagnosis not present

## 2017-10-26 ENCOUNTER — Ambulatory Visit: Payer: Medicare HMO | Admitting: Family Medicine

## 2017-10-26 DIAGNOSIS — N183 Chronic kidney disease, stage 3 unspecified: Secondary | ICD-10-CM | POA: Insufficient documentation

## 2017-10-26 DIAGNOSIS — E039 Hypothyroidism, unspecified: Secondary | ICD-10-CM | POA: Diagnosis not present

## 2017-10-26 DIAGNOSIS — J181 Lobar pneumonia, unspecified organism: Secondary | ICD-10-CM | POA: Diagnosis not present

## 2017-10-26 DIAGNOSIS — I10 Essential (primary) hypertension: Secondary | ICD-10-CM | POA: Diagnosis not present

## 2017-10-26 DIAGNOSIS — H409 Unspecified glaucoma: Secondary | ICD-10-CM | POA: Diagnosis not present

## 2017-10-26 DIAGNOSIS — J9611 Chronic respiratory failure with hypoxia: Secondary | ICD-10-CM | POA: Diagnosis not present

## 2017-10-26 DIAGNOSIS — Z6841 Body Mass Index (BMI) 40.0 and over, adult: Secondary | ICD-10-CM | POA: Insufficient documentation

## 2017-10-26 DIAGNOSIS — N179 Acute kidney failure, unspecified: Secondary | ICD-10-CM | POA: Diagnosis not present

## 2017-10-26 DIAGNOSIS — J441 Chronic obstructive pulmonary disease with (acute) exacerbation: Secondary | ICD-10-CM | POA: Diagnosis not present

## 2017-10-26 DIAGNOSIS — D649 Anemia, unspecified: Secondary | ICD-10-CM | POA: Diagnosis not present

## 2017-10-26 DIAGNOSIS — N1832 Chronic kidney disease, stage 3b: Secondary | ICD-10-CM | POA: Insufficient documentation

## 2017-10-26 DIAGNOSIS — K219 Gastro-esophageal reflux disease without esophagitis: Secondary | ICD-10-CM | POA: Diagnosis not present

## 2017-11-05 ENCOUNTER — Encounter: Payer: Self-pay | Admitting: Family Medicine

## 2017-11-05 ENCOUNTER — Ambulatory Visit (INDEPENDENT_AMBULATORY_CARE_PROVIDER_SITE_OTHER): Payer: Medicare HMO | Admitting: Family Medicine

## 2017-11-05 DIAGNOSIS — B373 Candidiasis of vulva and vagina: Secondary | ICD-10-CM | POA: Diagnosis not present

## 2017-11-05 DIAGNOSIS — Z6841 Body Mass Index (BMI) 40.0 and over, adult: Secondary | ICD-10-CM

## 2017-11-05 DIAGNOSIS — Z23 Encounter for immunization: Secondary | ICD-10-CM | POA: Diagnosis not present

## 2017-11-05 DIAGNOSIS — J189 Pneumonia, unspecified organism: Secondary | ICD-10-CM

## 2017-11-05 DIAGNOSIS — J181 Lobar pneumonia, unspecified organism: Secondary | ICD-10-CM | POA: Diagnosis not present

## 2017-11-05 DIAGNOSIS — I808 Phlebitis and thrombophlebitis of other sites: Secondary | ICD-10-CM | POA: Diagnosis not present

## 2017-11-05 DIAGNOSIS — J449 Chronic obstructive pulmonary disease, unspecified: Secondary | ICD-10-CM | POA: Diagnosis not present

## 2017-11-05 DIAGNOSIS — B3731 Acute candidiasis of vulva and vagina: Secondary | ICD-10-CM

## 2017-11-05 MED ORDER — FLUCONAZOLE 150 MG PO TABS: 150.0000 mg | ORAL_TABLET | Freq: Once | ORAL | 0 refills | Status: AC

## 2017-11-05 MED ORDER — FLUTICASONE-UMECLIDIN-VILANT 100-62.5-25 MCG/INH IN AEPB: 1.0000 | INHALATION_SPRAY | Freq: Every day | RESPIRATORY_TRACT | 99 refills | Status: DC

## 2017-11-05 NOTE — Patient Instructions (Signed)
Do some warm compresses for the swollen vein on her left arm. Prescription for Diflucan sent to the pharmacy for the yeast infection. We will restart her antibiotics.

## 2017-11-05 NOTE — Progress Notes (Signed)
Subjective:    Patient ID: Krystal Burgess, female    DOB: 13-Aug-1961, 56 y.o.   MRN: [id number]  HPI 56 year old female comes in today to follow-up for recent hospitalization.  She was actually having left-sided lower abdominal pain.  Actually did up doing a scan and found pneumonia.  Her white blood cell count was greater than 20,000 at the time and her magnesium was low.  She was admitted on November 2 and discharged home 3 days later on November 5.  She was diagnosed with pneumonia of the right upper lobe.  She was discharged home on Ceftin ear.  She stopped the medication early because she felt like she was developing a yeast infection.  She does have a swollen vein on her left anterior arm.  She has been taking naproxen.  It still feels sore but says it is actually less swollen than it was.  Her daughter is pregnant and she wants to make sure that her pertussis is up-to-date.  Insomnia - she wanted to know if her medication could be increased.  While in the hospital they were actually giving her Ambien which she has been off of for a while and it did help her sleep.  She says they had her medications quite confused during her admission.   Review of Systems  BP 126/60   Pulse (!) 102   Temp 98.7 F (37.1 C)   Ht 5\' 1"  (1.549 m)   Wt 267 lb (121.1 kg)   SpO2 94%   BMI 50.45 kg/m     Allergies  Allergen Reactions  . Aripiprazole     REACTION: muscle jerks  . Augmentin [Amoxicillin-Pot Clavulanate] Other (See Comments)    Gets yeast infection every time   . Fluoxetine Hcl     REACTION: Intolerant  . Geodon [Ziprasidone Hcl] Other (See Comments)    shakes  . Metformin And Related Nausea Only  . Nsaids Other (See Comments)    Upper GI bleed  . Onglyza [Saxagliptin] Nausea Only  . Paroxetine     REACTION: Intolerance    Past Medical History:  Diagnosis Date  . Allergy   . Hyperlipidemia   . Pain   . Thyroid disease     Past Surgical History:  Procedure  Laterality Date  . ABDOMINAL HYSTERECTOMY    . CESAREAN SECTION    . CHOLECYSTECTOMY    . lipoma removal     RT shoulder  . LUMBAR SPINE SURGERY  2013   Dr. Manson Passey, L4-5  . pilonydal cyst  1994    Social History   Socioeconomic History  . Marital status: Divorced    Spouse name: Not on file  . Number of children: Not on file  . Years of education: Not on file  . Highest education level: Not on file  Social Needs  . Financial resource strain: Not on file  . Food insecurity - worry: Not on file  . Food insecurity - inability: Not on file  . Transportation needs - medical: Not on file  . Transportation needs - non-medical: Not on file  Occupational History  . Not on file  Tobacco Use  . Smoking status: Current Every Day Smoker    Packs/day: 2.50  . Smokeless tobacco: Never Used  Substance and Sexual Activity  . Alcohol use: Not on file  . Drug use: No  . Sexual activity: Not on file    Comment: s with mother currentlynemployed, on disabilty, divorced, liver  Other Topics  Concern  . Not on file  Social History Narrative  . Not on file    Family History  Problem Relation Age of Onset  . Diabetes Brother   . Depression Brother        bipolar  . Cancer Brother        throat  . Diabetes Unknown        grandmother  . Depression Mother   . Hyperlipidemia Mother   . Hypertension Mother   . Pancreatic cancer Mother   . Hypertension Father     Outpatient Encounter Medications as of 11/05/2017  Medication Sig  . OXYGEN Inhale 4 L into the lungs.  Marland Kitchen. albuterol (VENTOLIN HFA) 108 (90 Base) MCG/ACT inhaler Inhale 2 puffs into the lungs every 4 (four) hours as needed for wheezing or shortness of breath.  . AMBULATORY NON FORMULARY MEDICATION Medication Name: ONE TOUCH METER. CHECK BLOOD SUGAR TWICE DAILY. DX:E11.9  . amLODipine (NORVASC) 5 MG tablet Take 1 tablet (5 mg total) by mouth daily.  . brimonidine-timolol (COMBIGAN) 0.2-0.5 % ophthalmic solution Place 2 drops into  both eyes daily.  Marland Kitchen. CARAFATE 1 GM/10ML suspension TAKE 10 MLS (1 G TOTAL) BY MOUTH 4 (FOUR) TIMES DAILY - WITH MEALS AND AT BEDTIME.  Marland Kitchen. doxepin (SINEQUAN) 10 MG capsule TAKE 1 CAPSULE (10 MG TOTAL) BY MOUTH AT BEDTIME.  . DULoxetine (CYMBALTA) 30 MG capsule TAKE ONE CAPSULE EVERY DAY  . DULoxetine (CYMBALTA) 60 MG capsule TAKE 1 CAPSULE (60 MG TOTAL) BY MOUTH DAILY.  . fluconazole (DIFLUCAN) 150 MG tablet Take 1 tablet (150 mg total) once for 1 dose by mouth.  . fluticasone (FLONASE) 50 MCG/ACT nasal spray PUT 2 SPRAYS INTO EACH NOSTRIL DAILY.  . fluticasone furoate-vilanterol (BREO ELLIPTA) 200-25 MCG/INH AEPB Inhale 1 puff into the lungs daily.  . Fluticasone-Umeclidin-Vilant (TRELEGY ELLIPTA) 100-62.5-25 MCG/INH AEPB Inhale 1 Inhaler daily into the lungs.  . furosemide (LASIX) 20 MG tablet Take 1 tablet (20 mg total) by mouth daily.  Marland Kitchen. gabapentin (NEURONTIN) 300 MG capsule TAKE 1 TAB DAILY FOR 1 WK THEN 1 CAP TWICE A DAY FOR 1 WK THEN 1 CAP AM AND 2 CAPS PM  . glucose blood test strip CHECK BLOOD SUGAR TWICE DAILY. DX: E11.9  . ipratropium (ATROVENT) 0.06 % nasal spray Place 2 sprays into both nostrils 4 (four) times daily.  Marland Kitchen. ipratropium-albuterol (DUONEB) 0.5-2.5 (3) MG/3ML SOLN USE 1 VIAL IN NEBULIZER EVERY 2 TO 3 HOURS  . JANUVIA 100 MG tablet TAKE 1 TABLET EVERY DAY  . levothyroxine (SYNTHROID, LEVOTHROID) 88 MCG tablet TAKE 1 TABLET (88 MCG TOTAL) BY MOUTH DAILY. APPOINTMENT REQUIRED FOR REFILLS  . losartan (COZAAR) 100 MG tablet TAKE 1 TABLET EVERY DAY -NEEDS APPT. FOR MORE REFILLS  . naproxen (NAPROSYN) 500 MG tablet Take 1 tablet (500 mg total) by mouth 2 (two) times daily with a meal.  . nystatin (MYCOSTATIN) 100000 UNIT/ML suspension TAKE 1 TEASPOON BY MOUTH FOUR TIMES A DAY  . oxyCODONE-acetaminophen (PERCOCET) 7.5-325 MG tablet TAKE 1 TABLET BY MOUTH EVERY 6 HOURS AS NEEDED FOR 30 DAYS  . pantoprazole (PROTONIX) 40 MG tablet TAKE 1 TABLET (40 MG TOTAL) BY MOUTH 2 (TWO) TIMES  DAILY.  Marland Kitchen. QUEtiapine (SEROQUEL) 300 MG tablet TAKE 1 TABLET (300 MG TOTAL) BY MOUTH 2 (TWO) TIMES DAILY.  . rosuvastatin (CRESTOR) 20 MG tablet Take 1 tablet (20 mg total) by mouth daily.  . sodium chloride 0.9 % nebulizer solution Take 3 mLs by nebulization as needed for wheezing.  .Marland Kitchen  theophylline (THEODUR) 300 MG 12 hr tablet TAKE 1 TABLET BY MOUTH TWICE A DAY  . tiZANidine (ZANAFLEX) 4 MG tablet Take 1 tablet (4 mg total) by mouth 2 (two) times daily as needed for muscle spasms.  Marland Kitchen umeclidinium bromide (INCRUSE ELLIPTA) 62.5 MCG/INH AEPB Inhale 1 puff into the lungs daily.  Marland Kitchen UNABLE TO FIND    No facility-administered encounter medications on file as of 11/05/2017.          Objective:   Physical Exam  Constitutional: She is oriented to person, place, and time. She appears well-developed and well-nourished.  HENT:  Head: Normocephalic and atraumatic.  Cardiovascular: Normal rate, regular rhythm and normal heart sounds.  Pulmonary/Chest: Effort normal and breath sounds normal.  Musculoskeletal:  In her left forearm she has a thickened and swollen vein.  No erythema over the skin.  But it is tender.  Neurological: She is alert and oriented to person, place, and time.  Skin: Skin is warm and dry.  Psychiatric: She has a normal mood and affect. Her behavior is normal.          Assessment & Plan:  F/U Pneumonia - she is much better.  Please restart antibiotiocs.   Yeast vaginitis - will tx with diflucan.    Low magnesium-due to recheck level.  Superficial thrombophlebitis on the left anterior lower arm-continue with naproxen and warm compresses.  Follow-up pneumonia-she did not complete her antibiotic because she started to get a yeast infection.  Will treat with Diflucan and then have her complete her antibiotics.  She was taking Ceftin ear.  COPD-we will switch to Trelegy.   Insomnia-recommended the [employer] by Kristopher Glee.  BMI 50-she is actually down 6  pounds.   Tdap given today.

## 2017-11-06 ENCOUNTER — Other Ambulatory Visit: Payer: Self-pay | Admitting: Family Medicine

## 2017-11-06 MED ORDER — FEXOFENADINE-PSEUDOEPHED ER 180-240 MG PO TB24: 1.0000 | ORAL_TABLET | Freq: Every day | ORAL | 1 refills | Status: DC

## 2017-11-07 DIAGNOSIS — J449 Chronic obstructive pulmonary disease, unspecified: Secondary | ICD-10-CM | POA: Diagnosis not present

## 2017-11-15 ENCOUNTER — Other Ambulatory Visit: Payer: Self-pay | Admitting: Family Medicine

## 2017-11-20 ENCOUNTER — Other Ambulatory Visit: Payer: Self-pay | Admitting: Family Medicine

## 2017-11-23 DIAGNOSIS — J449 Chronic obstructive pulmonary disease, unspecified: Secondary | ICD-10-CM | POA: Diagnosis not present

## 2017-12-04 ENCOUNTER — Other Ambulatory Visit: Payer: Self-pay | Admitting: Family Medicine

## 2017-12-07 DIAGNOSIS — J449 Chronic obstructive pulmonary disease, unspecified: Secondary | ICD-10-CM | POA: Diagnosis not present

## 2017-12-10 ENCOUNTER — Other Ambulatory Visit: Payer: Self-pay | Admitting: Family Medicine

## 2017-12-17 ENCOUNTER — Other Ambulatory Visit: Payer: Self-pay | Admitting: Family Medicine

## 2017-12-24 DIAGNOSIS — Z79899 Other long term (current) drug therapy: Secondary | ICD-10-CM | POA: Diagnosis not present

## 2017-12-24 DIAGNOSIS — M542 Cervicalgia: Secondary | ICD-10-CM | POA: Diagnosis not present

## 2017-12-24 DIAGNOSIS — M5442 Lumbago with sciatica, left side: Secondary | ICD-10-CM | POA: Diagnosis not present

## 2017-12-24 DIAGNOSIS — R202 Paresthesia of skin: Secondary | ICD-10-CM | POA: Diagnosis not present

## 2017-12-24 DIAGNOSIS — M5441 Lumbago with sciatica, right side: Secondary | ICD-10-CM | POA: Diagnosis not present

## 2017-12-24 DIAGNOSIS — G603 Idiopathic progressive neuropathy: Secondary | ICD-10-CM | POA: Diagnosis not present

## 2018-01-16 ENCOUNTER — Other Ambulatory Visit: Payer: Self-pay | Admitting: Family Medicine

## 2018-01-22 ENCOUNTER — Other Ambulatory Visit: Payer: Self-pay | Admitting: Sports Medicine

## 2018-01-22 DIAGNOSIS — J449 Chronic obstructive pulmonary disease, unspecified: Secondary | ICD-10-CM

## 2018-01-22 NOTE — Telephone Encounter (Signed)
To PCP

## 2018-01-25 ENCOUNTER — Other Ambulatory Visit: Payer: Self-pay | Admitting: Family Medicine

## 2018-01-27 ENCOUNTER — Encounter: Payer: Self-pay | Admitting: Physician Assistant

## 2018-01-27 ENCOUNTER — Ambulatory Visit (INDEPENDENT_AMBULATORY_CARE_PROVIDER_SITE_OTHER): Payer: Medicare HMO | Admitting: Physician Assistant

## 2018-01-27 VITALS — BP 139/67 | HR 97 | Ht 61.0 in | Wt 254.0 lb

## 2018-01-27 DIAGNOSIS — J0140 Acute pansinusitis, unspecified: Secondary | ICD-10-CM

## 2018-01-27 DIAGNOSIS — J322 Chronic ethmoidal sinusitis: Secondary | ICD-10-CM

## 2018-01-27 DIAGNOSIS — R0981 Nasal congestion: Secondary | ICD-10-CM

## 2018-01-27 MED ORDER — PREDNISONE 50 MG PO TABS: ORAL_TABLET | ORAL | 0 refills | Status: DC

## 2018-01-27 MED ORDER — AZITHROMYCIN 250 MG PO TABS: ORAL_TABLET | ORAL | 0 refills | Status: DC

## 2018-01-27 NOTE — Progress Notes (Signed)
Subjective:    Patient ID: Krystal Burgess, female    DOB: 08-26-1961, 57 y.o.   MRN: [id number]  HPI  Patient is a 57 year old female with COPD, asthma, chronic sinusitis who presents to the clinic with worsening sinus pressure.  She denies any shortness of breath other than her normal COPD.  She is taking trilogy daily with good benefit.  She is rarely using her rescue inhaler.  Patient reports nasal congestion and sinus pressure over her nasal bridge, frontal sinuses, maxillary sinuses for over a week.  She has the symptoms continuously intermittently as it is.  She has tried Atrovent and Flonase with little benefit.  She is a current every day smoker.  She has been using [employer] powders every day to help with sinus pressure.  She denies any epigastric pain at this time.  She denies any hematochezia or melena.  .. Active Ambulatory Problems    Diagnosis Date Noted  . Hypothyroidism 02/20/2009  . UNSPECIFIED VITAMIN D DEFICIENCY 07/13/2009  . HYPERLIPIDEMIA 12/12/2008  . Severe obesity (BMI >= 40) (HCC) 05/03/2009  . SMOKER 12/12/2008  . DEPRESSION 12/12/2008  . NEUROPATHY 12/12/2008  . HYPERTENSION, BENIGN 03/26/2009  . ALLERGIC RHINITIS 08/17/2009  . 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 COPD with asthma (HCC) 12/12/2008  . Spinal stenosis of lumbar region 12/12/2008  . LEG EDEMA, BILATERAL 03/26/2009  . Vitamin B 12 deficiency 06/24/2011  . 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 Bipolar disorder (HCC) 01/24/2013  . Bilateral carpal tunnel syndrome 03/14/2013  . Acute angle-closure glaucoma 10/03/2013  . Diabetes type 2, controlled (HCC) 09/07/2014  . Lumbar degenerative disc disease 08/14/2016  . Pulmonary nodule, RML, needs noncontrast CT in February 2019 01/26/2017  . Chronic insomnia 01/30/2017  . COPD exacerbation (HCC) 09/09/2017  . Acute non-recurrent pansinusitis 01/29/2018  . Chronic ethmoidal sinusitis 01/29/2018   Resolved Ambulatory Problems    Diagnosis Date  Noted  . HYPOKALEMIA 12/12/2008  . ANKLE PAIN, LEFT 02/05/2009  . SINUSITIS 02/20/2009   Past Medical History:  Diagnosis Date  . Allergy   . Hyperlipidemia   . Pain   . Thyroid disease      Review of Systems See HPI.     Objective:   Physical Exam  Constitutional: She is oriented to person, place, and time. She appears well-developed and well-nourished.  Obese.   HENT:  Head: Normocephalic and atraumatic.  Right Ear: External ear normal.  Left Ear: External ear normal.  TM's dull bilaterally. Light reflex still present.   Tenderness over nasal bridge, maxillary, frontal sinuses.   Nasal turbinates very red and swollen.   Eyes: Conjunctivae are normal. Right eye exhibits no discharge. Left eye exhibits no discharge.  Neck: Normal range of motion. Neck supple.  Cardiovascular: Normal rate, regular rhythm and normal heart sounds.  Pulmonary/Chest: Effort normal and breath sounds normal. She has no wheezes.  Lymphadenopathy:    She has no cervical adenopathy.  Neurological: She is alert and oriented to person, place, and time.  Psychiatric: She has a normal mood and affect. Her behavior is normal.          Assessment & Plan:  Marland KitchenMarland KitchenKiyani was seen today for headache and cough.  Diagnoses and all orders for this visit:  Chronic ethmoidal sinusitis -     azithromycin (ZITHROMAX) 250 MG tablet; Take 2 tablets now and then one tablet for 4 days. -     predniSONE (DELTASONE) 50 MG tablet; Take one tablet for 5 days.  Nasal  congestion -     predniSONE (DELTASONE) 50 MG tablet; Take one tablet for 5 days.  Acute non-recurrent pansinusitis -     azithromycin (ZITHROMAX) 250 MG tablet; Take 2 tablets now and then one tablet for 4 days. -     predniSONE (DELTASONE) 50 MG tablet; Take one tablet for 5 days.   At this point I do think some of her symptoms are consistent with a chronic sinusitis and she likely has some sinus polyps.  On exam her nasal turbinates are not  patent.  I would like to refer her to ear nose and throat.  She requests not to be sent to [facility] due to a outstanding bill.  Place referral today.  In the meantime she will start Z-Pak and course of prednisone.  Encouraged her to use Flonase daily.  Reassurance given that her lungs sound good.   Discussed smoking cessation patient is not ready to quit.

## 2018-01-27 NOTE — Patient Instructions (Addendum)
Use atrovent.  Start zpak and prednisone  Will make referral to ENT.  Sinusitis, Adult Sinusitis is soreness and inflammation of your sinuses. Sinuses are hollow spaces in the bones around your face. They are located:  Around your eyes.  In the middle of your forehead.  Behind your nose.  In your cheekbones.  Your sinuses and nasal passages are lined with a stringy fluid (mucus). Mucus normally drains out of your sinuses. When your nasal tissues get inflamed or swollen, the mucus can get trapped or blocked so air cannot flow through your sinuses. This lets bacteria, viruses, and funguses grow, and that leads to infection. Follow these instructions at home: Medicines  Take, use, or apply over-the-counter and prescription medicines only as told by your doctor. These may include nasal sprays.  If you were prescribed an antibiotic medicine, take it as told by your doctor. Do not stop taking the antibiotic even if you start to feel better. Hydrate and Humidify  Drink enough water to keep your pee (urine) clear or pale yellow.  Use a cool mist humidifier to keep the humidity level in your home above 50%.  Breathe in steam for 10-15 minutes, 3-4 times a day or as told by your doctor. You can do this in the bathroom while a hot shower is running.  Try not to spend time in cool or dry air. Rest  Rest as much as possible.  Sleep with your head raised (elevated).  Make sure to get enough sleep each night. General instructions  Put a warm, moist washcloth on your face 3-4 times a day or as told by your doctor. This will help with discomfort.  Wash your hands often with soap and water. If there is no soap and water, use hand sanitizer.  Do not smoke. Avoid being around people who are smoking (secondhand smoke).  Keep all follow-up visits as told by your doctor. This is important. Contact a doctor if:  You have a fever.  Your symptoms get worse.  Your symptoms do not get better  within 10 days. Get help right away if:  You have a very bad headache.  You cannot stop throwing up (vomiting).  You have pain or swelling around your face or eyes.  You have trouble seeing.  You feel confused.  Your neck is stiff.  You have trouble breathing. This information is not intended to replace advice given to you by your health care provider. Make sure you discuss any questions you have with your health care provider. Document Released: 05/26/2008 Document Revised: 08/03/2016 Document Reviewed: 10/03/2015 Elsevier Interactive Patient Education  [employer].

## 2018-01-29 ENCOUNTER — Encounter: Payer: Self-pay | Admitting: Physician Assistant

## 2018-01-29 DIAGNOSIS — J322 Chronic ethmoidal sinusitis: Secondary | ICD-10-CM | POA: Insufficient documentation

## 2018-01-29 DIAGNOSIS — J0140 Acute pansinusitis, unspecified: Secondary | ICD-10-CM | POA: Insufficient documentation

## 2018-02-15 ENCOUNTER — Other Ambulatory Visit: Payer: Self-pay | Admitting: Family Medicine

## 2018-02-17 ENCOUNTER — Encounter: Payer: Self-pay | Admitting: Physician Assistant

## 2018-02-17 ENCOUNTER — Other Ambulatory Visit: Payer: Self-pay | Admitting: *Deleted

## 2018-02-17 ENCOUNTER — Other Ambulatory Visit: Payer: Self-pay | Admitting: Family Medicine

## 2018-02-17 ENCOUNTER — Ambulatory Visit (INDEPENDENT_AMBULATORY_CARE_PROVIDER_SITE_OTHER): Payer: Self-pay | Admitting: Physician Assistant

## 2018-02-17 VITALS — BP 142/78 | HR 111 | Ht 61.0 in | Wt 252.0 lb

## 2018-02-17 DIAGNOSIS — J322 Chronic ethmoidal sinusitis: Secondary | ICD-10-CM

## 2018-02-17 DIAGNOSIS — F99 Mental disorder, not otherwise specified: Principal | ICD-10-CM

## 2018-02-17 DIAGNOSIS — F5105 Insomnia due to other mental disorder: Secondary | ICD-10-CM

## 2018-02-17 DIAGNOSIS — I10 Essential (primary) hypertension: Secondary | ICD-10-CM

## 2018-02-17 MED ORDER — LEVOFLOXACIN 500 MG PO TABS: 500.0000 mg | ORAL_TABLET | Freq: Every day | ORAL | 0 refills | Status: DC

## 2018-02-17 MED ORDER — METHYLPREDNISOLONE ACETATE 40 MG/ML IJ SUSP
40.0000 mg | Freq: Once | INTRAMUSCULAR | Status: AC
  Administered 2018-02-17: 40 mg via INTRAMUSCULAR

## 2018-02-17 MED ORDER — METHYLPREDNISOLONE SODIUM SUCC 125 MG IJ SOLR
125.0000 mg | Freq: Once | INTRAMUSCULAR | Status: AC
  Administered 2018-02-17: 125 mg via INTRAMUSCULAR

## 2018-02-17 NOTE — Progress Notes (Signed)
Subjective:     Patient ID: Krystal Burgess, female   DOB: 08/13/1961, 57 y.o.   MRN: [id number]  HPI   Krystal Burgess is a 57 year old female with multiple co-morbidities including COPD, DM (II), asthma, oxygen dependent, morbid obesity, hypothyrodism presented to the office with sinus and ear pain.   Her symptoms started last Thursday. She feels intense sinus pressure and congestion around her nose, cheek bone and forehead area. She also feel ear pain R>L. Sinus pressure is worse while laying down, and she can not breath through her nose if laying down and make her cough. She tried sinus powder without significant relief. She takes oxycodone for her back pain which helped little bit for her sinus pain but no relief in her ear pain. She tried Flonase and ipratropium inhalers with minimal improvement in her symptoms for short time. She was seen in the office at the beginning of this month with similar symptoms. She was treated with 5 day course of steroid and antibiotic. She states that the treatment help her but she never felt full recovery from her symptoms.  Patient was lives with her mom who was coughing a lot lately. Noted chills last night, states always having night sweats,but denies fever. Cough worse while laying down, no incerase in suputum prodiuction. No hemoptysis. Mild chest pain and increase in SOB upon exertion,  No palpitaton. No nausea, vomitting, diarrhea, constipation.   .. Active Ambulatory Problems    Diagnosis Date Noted  . Hypothyroidism 02/20/2009  . UNSPECIFIED VITAMIN D DEFICIENCY 07/13/2009  . HYPERLIPIDEMIA 12/12/2008  . Severe obesity (BMI >= 40) (HCC) 05/03/2009  . SMOKER 12/12/2008  . DEPRESSION 12/12/2008  . NEUROPATHY 12/12/2008  . HYPERTENSION, BENIGN 03/26/2009  . ALLERGIC RHINITIS 08/17/2009  . 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 COPD with asthma (HCC) 12/12/2008  . Spinal stenosis of lumbar region 12/12/2008  . LEG EDEMA, BILATERAL 03/26/2009  . Vitamin B 12  deficiency 06/24/2011  . 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 Bipolar disorder (HCC) 01/24/2013  . Bilateral carpal tunnel syndrome 03/14/2013  . Acute angle-closure glaucoma 10/03/2013  . Diabetes type 2, controlled (HCC) 09/07/2014  . Lumbar degenerative disc disease 08/14/2016  . Pulmonary nodule, RML, needs noncontrast CT in February 2019 01/26/2017  . Chronic insomnia 01/30/2017  . COPD exacerbation (HCC) 09/09/2017  . Acute non-recurrent pansinusitis 01/29/2018  . Chronic ethmoidal sinusitis 01/29/2018   Resolved Ambulatory Problems    Diagnosis Date Noted  . HYPOKALEMIA 12/12/2008  . ANKLE PAIN, LEFT 02/05/2009  . SINUSITIS 02/20/2009   Past Medical History:  Diagnosis Date  . Allergy   . Hyperlipidemia   . Pain   . Thyroid disease     Review of Systems   As stated in HPI.    Objective:   Physical Exam  Constitutional: She is oriented to person, place, and time.  Morbidly obese female in NAD.   HENT:  Head: Normocephalic and atraumatic.  Nose: Nose normal.  Nasal turbinate erythema bilaterally.   Eyes: Conjunctivae are normal. Pupils are equal, round, and reactive to light. Right eye exhibits no discharge. Left eye exhibits no discharge.  Neck: Neck supple. No thyromegaly present.  Submandibular lymphadenopathy and tenderness.  Cardiovascular: Normal rate and regular rhythm. Exam reveals no gallop and no friction rub.  No murmur heard. Pulmonary/Chest: Effort normal and breath sounds normal. She has no wheezes. She has no rales.  Neurological: She is alert and oriented to person, place, and time.  Psychiatric: She has  a normal mood and affect. Her behavior is normal. Judgment and thought content normal.       Assessment:     Diagnoses and all orders for this visit:  Chronic ethmoidal sinusitis -     levofloxacin (LEVAQUIN) 500 MG tablet; Take 1 tablet (500 mg total) by mouth daily. For 10 days. -     methylPREDNISolone sodium succinate  (SOLU-MEDROL) 125 mg/2 mL injection 125 mg -     methylPREDNISolone acetate (DEPO-MEDROL) injection 40 mg  HYPERTENSION, BENIGN      Plan:     Treatment options for chronic sinusitis is discussed with patient. Will continue to treat her symptoms with with levaquin. Patient was seen by  [facility] and were receiving allergy shots two times a week. New referrral were consulted for further evaluation.  Pt has appt with ENT tomorrow.   BP is elevated today. Follow up with PCP for management.

## 2018-03-12 ENCOUNTER — Other Ambulatory Visit: Payer: Self-pay | Admitting: Family Medicine

## 2018-03-19 ENCOUNTER — Other Ambulatory Visit: Payer: Self-pay | Admitting: Family Medicine

## 2018-03-31 ENCOUNTER — Other Ambulatory Visit: Payer: Self-pay | Admitting: Family Medicine

## 2018-03-31 ENCOUNTER — Other Ambulatory Visit: Payer: Self-pay | Admitting: *Deleted

## 2018-03-31 DIAGNOSIS — M5442 Lumbago with sciatica, left side: Secondary | ICD-10-CM | POA: Diagnosis not present

## 2018-03-31 DIAGNOSIS — G603 Idiopathic progressive neuropathy: Secondary | ICD-10-CM | POA: Diagnosis not present

## 2018-03-31 DIAGNOSIS — M542 Cervicalgia: Secondary | ICD-10-CM | POA: Diagnosis not present

## 2018-03-31 DIAGNOSIS — R202 Paresthesia of skin: Secondary | ICD-10-CM | POA: Diagnosis not present

## 2018-03-31 DIAGNOSIS — G5603 Carpal tunnel syndrome, bilateral upper limbs: Secondary | ICD-10-CM | POA: Diagnosis not present

## 2018-03-31 DIAGNOSIS — M5441 Lumbago with sciatica, right side: Secondary | ICD-10-CM | POA: Diagnosis not present

## 2018-04-01 ENCOUNTER — Other Ambulatory Visit: Payer: Self-pay | Admitting: Specialist

## 2018-04-01 DIAGNOSIS — M5417 Radiculopathy, lumbosacral region: Secondary | ICD-10-CM

## 2018-04-05 ENCOUNTER — Ambulatory Visit (INDEPENDENT_AMBULATORY_CARE_PROVIDER_SITE_OTHER): Payer: Medicare HMO | Admitting: Physician Assistant

## 2018-04-05 ENCOUNTER — Encounter: Payer: Self-pay | Admitting: Physician Assistant

## 2018-04-05 ENCOUNTER — Other Ambulatory Visit: Payer: Medicare HMO

## 2018-04-05 VITALS — BP 145/85 | HR 112 | Temp 99.7°F

## 2018-04-05 DIAGNOSIS — J441 Chronic obstructive pulmonary disease with (acute) exacerbation: Secondary | ICD-10-CM | POA: Diagnosis not present

## 2018-04-05 MED ORDER — METHYLPREDNISOLONE SODIUM SUCC 125 MG IJ SOLR
125.0000 mg | Freq: Once | INTRAMUSCULAR | Status: AC
  Administered 2018-04-05: 125 mg via INTRAMUSCULAR

## 2018-04-05 MED ORDER — METHYLPREDNISOLONE ACETATE 40 MG/ML IJ SUSP
40.0000 mg | Freq: Once | INTRAMUSCULAR | Status: AC
  Administered 2018-04-05: 40 mg via INTRAMUSCULAR

## 2018-04-05 MED ORDER — DOXYCYCLINE HYCLATE 100 MG PO TABS: 100.0000 mg | ORAL_TABLET | Freq: Two times a day (BID) | ORAL | 0 refills | Status: DC

## 2018-04-05 NOTE — Patient Instructions (Signed)
Chronic Obstructive Pulmonary Disease Exacerbation  Chronic obstructive pulmonary disease (COPD) is a common lung problem. In COPD, the flow of air from the lungs is limited. COPD exacerbations are times that breathing gets worse and you need extra treatment. Without treatment they can be life threatening. If they happen often, your lungs can become more damaged. If your COPD gets worse, your doctor may treat you with:  ? Medicines.  ? Oxygen.  ? Different ways to clear your airway, such as using a mask.    Follow these instructions at home:  ? Do not smoke.  ? Avoid tobacco smoke and other things that bother your lungs.  ? If given, take your antibiotic medicine as told. Finish the medicine even if you start to feel better.  ? Only take medicines as told by your doctor.  ? Drink enough fluids to keep your pee (urine) clear or pale yellow (unless your doctor has told you not to).  ? Use a cool mist machine (vaporizer).  ? If you use oxygen or a machine that turns liquid medicine into a mist (nebulizer), continue to use them as told.  ? Keep up with shots (vaccinations) as told by your doctor.  ? Exercise regularly.  ? Eat healthy foods.  ? Keep all doctor visits as told.  Get help right away if:  ? You are very short of breath and it gets worse.  ? You have trouble talking.  ? You have bad chest pain.  ? You have blood in your spit (sputum).  ? You have a fever.  ? You keep throwing up (vomiting).  ? You feel weak, or you pass out (faint).  ? You feel confused.  ? You keep getting worse.  This information is not intended to replace advice given to you by your health care provider. Make sure you discuss any questions you have with your health care provider.  Document Released: 11/27/2011 Document Revised: 05/15/2016 Document Reviewed: 08/12/2013  Elsevier Interactive Patient Education ? 2017 [employer] Inc[employer]

## 2018-04-05 NOTE — Progress Notes (Signed)
Subjective:    Patient ID: Krystal Burgess, female    DOB: [birth year], 57 y.o.   MRN: [id number]  HPI Krystal Burgess presents today with sinus congestion, cough, and shortness of breath. Hx of COPD and is a smoker. She states that there have been several people sick at her house. She complains of associated fevers and chills. Her O2 sats at home have been 85-88, sometimes she is able to get it above 90% only when she is able to use a her mothers oxygen. She uses her nebulizer machine every 2 hours. She uses a portable oxygen machine and is requesting a new company to replace it as she feels it is not working properly and she does not like the way the current company is working.   .. Active Ambulatory Problems    Diagnosis Date Noted  . Hypothyroidism 02/20/2009  . UNSPECIFIED VITAMIN D DEFICIENCY 07/13/2009  . HYPERLIPIDEMIA 12/12/2008  . Severe obesity (BMI >= 40) (HCC) 05/03/2009  . SMOKER 12/12/2008  . DEPRESSION 12/12/2008  . NEUROPATHY 12/12/2008  . HYPERTENSION, BENIGN 03/26/2009  . ALLERGIC RHINITIS 08/17/2009  . 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 COPD with asthma (HCC) 12/12/2008  . Spinal stenosis of lumbar region 12/12/2008  . LEG EDEMA, BILATERAL 03/26/2009  . Vitamin B 12 deficiency 06/24/2011  . 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 Bipolar disorder (HCC) 01/24/2013  . Bilateral carpal tunnel syndrome 03/14/2013  . Acute angle-closure glaucoma 10/03/2013  . Diabetes type 2, controlled (HCC) 09/07/2014  . Lumbar degenerative disc disease 08/14/2016  . Pulmonary nodule, RML, needs noncontrast CT in February 2019 01/26/2017  . Chronic insomnia 01/30/2017  . COPD exacerbation (HCC) 09/09/2017  . Acute non-recurrent pansinusitis 01/29/2018  . Chronic ethmoidal sinusitis 01/29/2018   Resolved Ambulatory Problems    Diagnosis Date Noted  . HYPOKALEMIA 12/12/2008  . ANKLE PAIN, LEFT 02/05/2009  . SINUSITIS 02/20/2009   Past Medical History:  Diagnosis Date  . Allergy    . Hyperlipidemia   . Pain   . Thyroid disease       Review of Systems  Constitutional: Positive for chills and fever. Negative for activity change.  HENT: Positive for congestion, sinus pressure and sinus pain.   Respiratory: Positive for cough and shortness of breath.   Cardiovascular: Negative for chest pain and palpitations.  Gastrointestinal: Negative for diarrhea and vomiting.  Skin: Negative for wound.       Objective:   Physical Exam  Constitutional: She is oriented to person, place, and time. She appears well-developed and well-nourished.  HENT:  Head: Normocephalic and atraumatic.  Eyes: Conjunctivae are normal.  Neck: Normal range of motion. Neck supple.  Cardiovascular: Normal rate, regular rhythm and normal heart sounds.  No murmur heard. Pulmonary/Chest: Effort normal. No accessory muscle usage. No respiratory distress. She has wheezes.  On home oxygen; no pursed lip breathing  Musculoskeletal: Normal range of motion.  Neurological: She is alert and oriented to person, place, and time.  Skin: Skin is warm and dry.  Psychiatric: She has a normal mood and affect. Her behavior is normal.  Vitals reviewed.         Assessment & Plan:  Marland KitchenMarland KitchenDiagnoses and all orders for this visit:  COPD exacerbation (HCC) -     doxycycline (VIBRA-TABS) 100 MG tablet; Take 1 tablet (100 mg total) by mouth 2 (two) times daily. -     methylPREDNISolone sodium succinate (SOLU-MEDROL) 125 mg/2 mL injection 125 mg -     methylPREDNISolone acetate (DEPO-MEDROL) injection  40 mg   Pulse ox today in office was 85% on room air without exertion.  She did bring in a portable oxygen tank.  She does not feel like this is working properly.  When she places the oxygen tank on at 4 L her pulse ox increased to 87%.  She requests a new oxygen tank with a new company.  Patient is certainly in a COPD exacerbation.  I am concerned because she is not using oxygen at night and very likely that her O2  sats are dropping significantly.  Encouraged her to even use her portable tank at night.  Solu-Medrol and Depo-Medrol given in the office today.  Will avoid oral prednisone if at all possible.  Go ahead and start doxycycline for 10 days.  Follow-up as needed.  Continue on trilogy daily as well as DuoNeb every 2 hours.

## 2018-04-07 ENCOUNTER — Other Ambulatory Visit: Payer: Self-pay | Admitting: Family Medicine

## 2018-04-08 DIAGNOSIS — I501 Left ventricular failure: Secondary | ICD-10-CM | POA: Diagnosis not present

## 2018-04-08 DIAGNOSIS — J9600 Acute respiratory failure, unspecified whether with hypoxia or hypercapnia: Secondary | ICD-10-CM | POA: Diagnosis not present

## 2018-04-08 DIAGNOSIS — R069 Unspecified abnormalities of breathing: Secondary | ICD-10-CM | POA: Diagnosis not present

## 2018-04-08 DIAGNOSIS — J122 Parainfluenza virus pneumonia: Secondary | ICD-10-CM | POA: Diagnosis not present

## 2018-04-08 DIAGNOSIS — I129 Hypertensive chronic kidney disease with stage 1 through stage 4 chronic kidney disease, or unspecified chronic kidney disease: Secondary | ICD-10-CM | POA: Diagnosis not present

## 2018-04-08 DIAGNOSIS — E1142 Type 2 diabetes mellitus with diabetic polyneuropathy: Secondary | ICD-10-CM | POA: Diagnosis not present

## 2018-04-08 DIAGNOSIS — R062 Wheezing: Secondary | ICD-10-CM | POA: Diagnosis not present

## 2018-04-08 DIAGNOSIS — J9601 Acute respiratory failure with hypoxia: Secondary | ICD-10-CM | POA: Diagnosis not present

## 2018-04-08 DIAGNOSIS — E870 Hyperosmolality and hypernatremia: Secondary | ICD-10-CM | POA: Diagnosis not present

## 2018-04-08 DIAGNOSIS — I10 Essential (primary) hypertension: Secondary | ICD-10-CM | POA: Diagnosis not present

## 2018-04-08 DIAGNOSIS — R000 Tachycardia, unspecified: Secondary | ICD-10-CM | POA: Insufficient documentation

## 2018-04-08 DIAGNOSIS — N183 Chronic kidney disease, stage 3 (moderate): Secondary | ICD-10-CM | POA: Diagnosis not present

## 2018-04-08 DIAGNOSIS — J9811 Atelectasis: Secondary | ICD-10-CM | POA: Diagnosis not present

## 2018-04-08 DIAGNOSIS — R599 Enlarged lymph nodes, unspecified: Secondary | ICD-10-CM | POA: Diagnosis not present

## 2018-04-08 DIAGNOSIS — J441 Chronic obstructive pulmonary disease with (acute) exacerbation: Secondary | ICD-10-CM | POA: Diagnosis not present

## 2018-04-08 DIAGNOSIS — R0602 Shortness of breath: Secondary | ICD-10-CM | POA: Diagnosis not present

## 2018-04-08 DIAGNOSIS — Z6841 Body Mass Index (BMI) 40.0 and over, adult: Secondary | ICD-10-CM | POA: Diagnosis not present

## 2018-04-08 DIAGNOSIS — R05 Cough: Secondary | ICD-10-CM | POA: Diagnosis not present

## 2018-04-08 DIAGNOSIS — R918 Other nonspecific abnormal finding of lung field: Secondary | ICD-10-CM | POA: Diagnosis not present

## 2018-04-08 DIAGNOSIS — J440 Chronic obstructive pulmonary disease with acute lower respiratory infection: Secondary | ICD-10-CM | POA: Diagnosis not present

## 2018-04-08 DIAGNOSIS — R69 Illness, unspecified: Secondary | ICD-10-CM | POA: Diagnosis not present

## 2018-04-08 DIAGNOSIS — J9621 Acute and chronic respiratory failure with hypoxia: Secondary | ICD-10-CM | POA: Diagnosis not present

## 2018-04-08 DIAGNOSIS — J9611 Chronic respiratory failure with hypoxia: Secondary | ICD-10-CM | POA: Diagnosis not present

## 2018-04-08 DIAGNOSIS — J9602 Acute respiratory failure with hypercapnia: Secondary | ICD-10-CM | POA: Diagnosis not present

## 2018-04-08 DIAGNOSIS — J9622 Acute and chronic respiratory failure with hypercapnia: Secondary | ICD-10-CM | POA: Diagnosis not present

## 2018-04-08 DIAGNOSIS — E1122 Type 2 diabetes mellitus with diabetic chronic kidney disease: Secondary | ICD-10-CM | POA: Diagnosis not present

## 2018-04-09 DIAGNOSIS — R05 Cough: Secondary | ICD-10-CM | POA: Diagnosis not present

## 2018-04-21 DIAGNOSIS — J449 Chronic obstructive pulmonary disease, unspecified: Secondary | ICD-10-CM | POA: Diagnosis not present

## 2018-04-22 ENCOUNTER — Inpatient Hospital Stay: Payer: Medicare HMO | Admitting: Family Medicine

## 2018-04-22 NOTE — Progress Notes (Deleted)
   Subjective:    Patient ID: Krystal Burgess, female    DOB: April 21, 1961, 57 y.o.   MRN: [id number]  HPI    Review of Systems     Objective:   Physical Exam        Assessment & Plan:

## 2018-04-26 ENCOUNTER — Ambulatory Visit (INDEPENDENT_AMBULATORY_CARE_PROVIDER_SITE_OTHER): Payer: Medicare HMO | Admitting: Family Medicine

## 2018-04-26 ENCOUNTER — Encounter: Payer: Self-pay | Admitting: Family Medicine

## 2018-04-26 VITALS — BP 142/68 | HR 111 | Ht 61.0 in | Wt 250.0 lb

## 2018-04-26 DIAGNOSIS — F17200 Nicotine dependence, unspecified, uncomplicated: Secondary | ICD-10-CM | POA: Diagnosis not present

## 2018-04-26 DIAGNOSIS — F430 Acute stress reaction: Secondary | ICD-10-CM | POA: Diagnosis not present

## 2018-04-26 DIAGNOSIS — J441 Chronic obstructive pulmonary disease with (acute) exacerbation: Secondary | ICD-10-CM

## 2018-04-26 DIAGNOSIS — J449 Chronic obstructive pulmonary disease, unspecified: Secondary | ICD-10-CM

## 2018-04-26 DIAGNOSIS — R69 Illness, unspecified: Secondary | ICD-10-CM | POA: Diagnosis not present

## 2018-04-26 DIAGNOSIS — I10 Essential (primary) hypertension: Secondary | ICD-10-CM | POA: Diagnosis not present

## 2018-04-26 MED ORDER — FEXOFENADINE HCL 180 MG PO TABS: 180.0000 mg | ORAL_TABLET | Freq: Every day | ORAL | 3 refills | Status: DC

## 2018-04-26 MED ORDER — BUSPIRONE HCL 5 MG PO TABS: 5.0000 mg | ORAL_TABLET | Freq: Two times a day (BID) | ORAL | 1 refills | Status: DC

## 2018-04-26 MED ORDER — METHYLPREDNISOLONE SODIUM SUCC 125 MG IJ SOLR
125.0000 mg | Freq: Once | INTRAMUSCULAR | Status: AC
  Administered 2018-04-26: 125 mg via INTRAMUSCULAR

## 2018-04-26 MED ORDER — METHYLPREDNISOLONE ACETATE 80 MG/ML IJ SUSP
80.0000 mg | Freq: Once | INTRAMUSCULAR | Status: AC
  Administered 2018-04-26: 80 mg via INTRAMUSCULAR

## 2018-04-26 MED ORDER — NICOTINE 21 MG/24HR TD PT24: 21.0000 mg | MEDICATED_PATCH | Freq: Every day | TRANSDERMAL | 0 refills | Status: DC

## 2018-04-26 NOTE — Patient Instructions (Signed)
Once you run out of the 21 mg nicotine patches let me know and we can send over new prescription for the 14 mg and work on tapering you down.

## 2018-04-26 NOTE — Progress Notes (Signed)
Subjective:    Patient ID: Krystal Burgess, female    DOB: Apr 26, 1961, 57 y.o.   MRN: [id number]  HPI 56 year old female with a history of severe COPD/asthma comes in today for hospital follow-up.  She was admitted to [facility] health on April 18 and discharged home 3 days later on April 21.  She was diagnosed with acute respiratory failure with hypoxemia and hypercapnia.  She initially presented to the emergency department in respiratory failure on 4 L nasal cannula with significant shortness of breath.  She was placed on BiPAP quickly initial ABG was abnormal.  EKG showed sinus tachycardia.  proBNP was 682 but troponins were negative.  She was admitted to the ICU.  I diagnosed her with community-acquired pneumonia and she was started on Zosyn and then switched to [facility].  CT of the chest confirmed bilateral infiltrates and she was positive for parainfluenza.  She was continued on her Symbicort, Spiriva theophylline and prednisone.  She is feeling about 70% better.  Still just a little weak and tired and still with some cough the sputum is mostly white and just a little yellow-tinged.   She has followed up with pulmonology since her discharge from the hospital.  They did spirometry and then she has a follow-up on the 14th to go over results and make adjustments to her regimen.  Hypertension- Pt denies chest pain, SOB, dizziness, or heart palpitations.  Taking meds as directed w/o problems.  Denies medication side effects.    Mom has been sick with pneumonia as well and was recently placed on hospice.  In fact she is at the [facility] in [place].  Wanted to know if we can call in something for anxiety.  They gave her something while she was in the hospital and says that it works well.  But she also spoke with a friend who is on narcotics he was given BuSpar and says that that worked well her for her friend and is willing to try it.   Tobacco abuse-she would like to have a prescription  for nicotine patches.  She had been in the hospital and says it actually really did help curb her craving for smoking.  She currently has a 21 mg patch on right now.  Allergic rhinitis-she would like a prescription sent for her fexofenadine.  Review of Systems  BP (!) 142/68   Pulse (!) 111   Ht 5\' 1"  (1.549 m)   Wt 250 lb (113.4 kg)   SpO2 95% Comment: 4L  BMI 47.24 kg/m     Allergies  Allergen Reactions  . Aripiprazole     REACTION: muscle jerks  . Augmentin [Amoxicillin-Pot Clavulanate] Other (See Comments)    Gets yeast infection every time   . Fluoxetine Hcl     REACTION: Intolerant  . Geodon [Ziprasidone Hcl] Other (See Comments)    shakes  . Metformin And Related Nausea Only  . Nsaids Other (See Comments)    Upper GI bleed  . Onglyza [Saxagliptin] Nausea Only  . Paroxetine     REACTION: Intolerance    Past Medical History:  Diagnosis Date  . Allergy   . Hyperlipidemia   . Pain   . Thyroid disease     Past Surgical History:  Procedure Laterality Date  . ABDOMINAL HYSTERECTOMY    . CESAREAN SECTION    . CHOLECYSTECTOMY    . lipoma removal     RT shoulder  . LUMBAR SPINE SURGERY  2013   Dr.  Brown, L4-5  . pilonydal cyst  1994    Social History   Socioeconomic History  . Marital status: Divorced    Spouse name: Not on file  . Number of children: Not on file  . Years of education: Not on file  . Highest education level: Not on file  Occupational History  . Not on file  Social Needs  . Financial resource strain: Not on file  . Food insecurity:    Worry: Not on file    Inability: Not on file  . Transportation needs:    Medical: Not on file    Non-medical: Not on file  Tobacco Use  . Smoking status: Current Every Day Smoker    Packs/day: 2.50  . Smokeless tobacco: Never Used  Substance and Sexual Activity  . Alcohol use: Not on file  . Drug use: No  . Sexual activity: Not on file    Comment: s with mother currentlynemployed, on disabilty,  divorced, liver  Lifestyle  . Physical activity:    Days per week: Not on file    Minutes per session: Not on file  . Stress: Not on file  Relationships  . Social connections:    Talks on phone: Not on file    Gets together: Not on file    Attends religious service: Not on file    Active member of club or organization: Not on file    Attends meetings of clubs or organizations: Not on file    Relationship status: Not on file  . Intimate partner violence:    Fear of current or ex partner: Not on file    Emotionally abused: Not on file    Physically abused: Not on file    Forced sexual activity: Not on file  Other Topics Concern  . Not on file  Social History Narrative  . Not on file    Family History  Problem Relation Age of Onset  . Diabetes Brother   . Depression Brother        bipolar  . Cancer Brother        throat  . Diabetes Unknown        grandmother  . Depression Mother   . Hyperlipidemia Mother   . Hypertension Mother   . Pancreatic cancer Mother   . Hypertension Father     Outpatient Encounter Medications as of 04/26/2018  Medication Sig  . albuterol (VENTOLIN HFA) 108 (90 Base) MCG/ACT inhaler Inhale 2 puffs into the lungs every 4 (four) hours as needed for wheezing or shortness of breath.  . AMBULATORY NON FORMULARY MEDICATION Medication Name: ONE TOUCH METER. CHECK BLOOD SUGAR TWICE DAILY. DX:E11.9  . amLODipine (NORVASC) 5 MG tablet TAKE 1 TABLET (5 MG TOTAL) BY MOUTH DAILY.  . brimonidine-timolol (COMBIGAN) 0.2-0.5 % ophthalmic solution Place 2 drops into both eyes daily.  Marland Kitchen CARAFATE 1 GM/10ML suspension TAKE 10 MLS (1 G TOTAL) BY MOUTH 4 (FOUR) TIMES DAILY - WITH MEALS AND AT BEDTIME.  Marland Kitchen diclofenac sodium (VOLTAREN) 1 % GEL APPLE 4G TO AFFECTED AREA UP TO THREE TIMES A DAY  . doxepin (SINEQUAN) 10 MG capsule TAKE ONE CAPSULE AT BEDTIME  . DULoxetine (CYMBALTA) 30 MG capsule TAKE ONE CAPSULE EVERY DAY  . DULoxetine (CYMBALTA) 60 MG capsule TAKE 1 CAPSULE  (60 MG TOTAL) BY MOUTH DAILY.  . fluticasone (FLONASE) 50 MCG/ACT nasal spray PUT 2 SPRAYS INTO EACH NOSTRIL DAILY.  Marland Kitchen Fluticasone-Umeclidin-Vilant (TRELEGY ELLIPTA) 100-62.5-25 MCG/INH AEPB Inhale 1 Inhaler daily into  the lungs.  . furosemide (LASIX) 20 MG tablet TAKE 1 TABLET BY MOUTH EVERY DAY  . gabapentin (NEURONTIN) 600 MG tablet Take 600 mg by mouth 3 (three) times daily.  Marland Kitchen glucose blood test strip CHECK BLOOD SUGAR TWICE DAILY. DX: E11.9  . ipratropium (ATROVENT) 0.06 % nasal spray Place 2 sprays into both nostrils 4 (four) times daily.  Marland Kitchen ipratropium-albuterol (DUONEB) 0.5-2.5 (3) MG/3ML SOLN USE 1 VIAL IN NEBULIZER EVERY 2 TO 3 HOURS  . JANUVIA 100 MG tablet TAKE 1 TABLET EVERY DAY  . levothyroxine (SYNTHROID, LEVOTHROID) 88 MCG tablet TAKE 1 TABLET (88 MCG TOTAL) BY MOUTH DAILY. APPOINTMENT REQUIRED FOR REFILLS  . lidocaine (LIDODERM) 5 % APPLY UP TO 3 PATCHES TO AFFECTED AREA DAILY PRN FOR 30 DAYS  . losartan (COZAAR) 100 MG tablet TAKE 1 TABLET EVERY DAY -NEEDS APPT. FOR MORE REFILLS  . naproxen (NAPROSYN) 500 MG tablet TAKE 1 TABLET (500 MG TOTAL) BY MOUTH 2 (TWO) TIMES DAILY WITH A MEAL.  Marland Kitchen oxyCODONE-acetaminophen (PERCOCET) 7.5-325 MG tablet TAKE 1 TABLET BY MOUTH EVERY 6 HOURS AS NEEDED FOR 30 DAYS  . OXYGEN Inhale 4 L into the lungs.  . pantoprazole (PROTONIX) 40 MG tablet TAKE 1 TABLET BY MOUTH TWICE A DAY  . QUEtiapine (SEROQUEL) 300 MG tablet TAKE 1 TABLET (300 MG TOTAL) BY MOUTH 2 (TWO) TIMES DAILY.  . rosuvastatin (CRESTOR) 20 MG tablet Take 1 tablet (20 mg total) by mouth daily. LAST REFILL. LABS NEEDED FOR FUTURE REFILLS.PLEASE SCHEDULE AN APPOINTMENT  . sodium chloride 0.9 % nebulizer solution Take 3 mLs by nebulization as needed for wheezing.  . theophylline (THEODUR) 300 MG 12 hr tablet Take 1 tablet (300 mg total) by mouth 2 (two) times daily. FOLLOW UP APPOINTMENT REQUIRED FOR REFILLS. CALL OFFICE TO SCHEDULE.LAST REFILL.  Marland Kitchen UNABLE TO FIND   . busPIRone (BUSPAR) 5  MG tablet Take 1 tablet (5 mg total) by mouth 2 (two) times daily.  . fexofenadine (ALLEGRA) 180 MG tablet Take 1 tablet (180 mg total) by mouth daily.  . nicotine (NICODERM CQ - DOSED IN MG/24 HOURS) 21 mg/24hr patch Place 1 patch (21 mg total) onto the skin daily.  . [DISCONTINUED] doxycycline (VIBRA-TABS) 100 MG tablet Take 1 tablet (100 mg total) by mouth 2 (two) times daily.  . [DISCONTINUED] fexofenadine-pseudoephedrine (ALLEGRA-D ALLERGY & CONGESTION) 180-240 MG 24 hr tablet Take 1 tablet daily by mouth.  . [EXPIRED] methylPREDNISolone acetate (DEPO-MEDROL) injection 80 mg   . [EXPIRED] methylPREDNISolone sodium succinate (SOLU-MEDROL) 125 mg/2 mL injection 125 mg    No facility-administered encounter medications on file as of 04/26/2018.          Objective:   Physical Exam  Constitutional: She is oriented to person, place, and time. She appears well-developed and well-nourished.  HENT:  Head: Normocephalic and atraumatic.  Right Ear: External ear normal.  Left Ear: External ear normal.  Nose: Nose normal.  Mouth/Throat: Oropharynx is clear and moist.  TMs and canals are clear.   Eyes: Pupils are equal, round, and reactive to light. Conjunctivae and EOM are normal.  Neck: Neck supple. No thyromegaly present.  Cardiovascular: Normal rate, regular rhythm and normal heart sounds.  Pulmonary/Chest: Effort normal and breath sounds normal. She has no wheezes.  Lymphadenopathy:    She has no cervical adenopathy.  Neurological: She is alert and oriented to person, place, and time.  Skin: Skin is warm and dry.  Psychiatric: She has a normal mood and affect.  Assessment & Plan:  Severe COPD exacerbation-she did follow with pulmonology and had a spirometry performed.  She follows back up with him on the 14th to go over the results and they are scheduling her for a sleep apnea test.  She actually had one done several years ago it was actually negative.  Community-acquired  pneumonia-positive for parainfluenza.  She did complete her Omnicef at home and feels about 70% better.  Just continue to monitor for daily improvement.  HTN -she is still just borderline elevated.  We will keep an eye on this.  Tobacco Abuse -will be happy to send over the nicotine patch.  We will send over the 21 mg.  She is down to 1 pack a day from about 2-1/2 packs a day previously.  He says she knows she is really got to quit smoking.  Allergic rhinitis-refill Allegra.  Acute Stress-her mother is on hospice and is not doing well.  This at the most she probably has 3 months to live.

## 2018-04-27 ENCOUNTER — Other Ambulatory Visit: Payer: Self-pay | Admitting: Family Medicine

## 2018-04-27 ENCOUNTER — Telehealth: Payer: Self-pay | Admitting: Emergency Medicine

## 2018-04-27 DIAGNOSIS — J449 Chronic obstructive pulmonary disease, unspecified: Secondary | ICD-10-CM | POA: Diagnosis not present

## 2018-04-27 NOTE — Telephone Encounter (Signed)
Patient called and left message that after her steroid injections during office visit yesterday she has started coughing productively and thinks she should be on an antibiotic. I called and spoke to daughter: they do not have thermometer and cannot say whether she has a fever; there are no other symptoms. I asked them to obtain a thermometer, if possible , and take her temp q 4 hours and notify us if fever occurred, and said I would also relay message to Dr.Metheney.

## 2018-04-28 MED ORDER — AZITHROMYCIN 250 MG PO TABS: ORAL_TABLET | ORAL | 0 refills | Status: AC

## 2018-04-28 NOTE — Telephone Encounter (Signed)
Attempted to contact Pt, no answer and no VM on home number

## 2018-04-28 NOTE — Telephone Encounter (Signed)
New rx sent

## 2018-04-29 MED ORDER — PREDNISONE 20 MG PO TABS: 40.0000 mg | ORAL_TABLET | Freq: Every day | ORAL | 0 refills | Status: DC

## 2018-04-29 NOTE — Telephone Encounter (Signed)
[name] called and states she now has a productive cough with green sputum and increase in SOB. She would like prednisone. She states her mom is in the hospital and she is not able to see her in the condition she is in.

## 2018-04-29 NOTE — Telephone Encounter (Signed)
OK, new rx send for prednisone.

## 2018-04-29 NOTE — Telephone Encounter (Signed)
Pt's daughter advised.  

## 2018-05-03 NOTE — Telephone Encounter (Signed)
Krystal Burgess called and states she finished the prednisone this morning. She states she believes she needs a few more days of the prednisone. She states she can't come in at the moment but will come in if not better in a few days. Please advise.

## 2018-05-04 ENCOUNTER — Other Ambulatory Visit: Payer: Self-pay | Admitting: Family Medicine

## 2018-05-04 MED ORDER — PREDNISONE 20 MG PO TABS: 40.0000 mg | ORAL_TABLET | Freq: Every day | ORAL | 0 refills | Status: DC

## 2018-05-04 NOTE — Telephone Encounter (Signed)
OK to refill prednisine.

## 2018-05-04 NOTE — Telephone Encounter (Signed)
Rx sent Pt advised 

## 2018-05-04 NOTE — Addendum Note (Signed)
Addended by: Collie Siad on: 05/04/2018 03:02 PM   Modules accepted: Orders

## 2018-05-11 ENCOUNTER — Other Ambulatory Visit: Payer: Self-pay | Admitting: Family Medicine

## 2018-05-20 ENCOUNTER — Other Ambulatory Visit: Payer: Self-pay | Admitting: Family Medicine

## 2018-05-22 ENCOUNTER — Other Ambulatory Visit: Payer: Self-pay | Admitting: Family Medicine

## 2018-05-24 ENCOUNTER — Ambulatory Visit: Payer: Medicare HMO | Admitting: Physician Assistant

## 2018-05-25 ENCOUNTER — Ambulatory Visit: Payer: Medicare HMO | Admitting: Physician Assistant

## 2018-05-25 ENCOUNTER — Encounter (INDEPENDENT_AMBULATORY_CARE_PROVIDER_SITE_OTHER): Payer: Self-pay

## 2018-05-25 ENCOUNTER — Encounter: Payer: Self-pay | Admitting: Family Medicine

## 2018-05-25 ENCOUNTER — Ambulatory Visit (INDEPENDENT_AMBULATORY_CARE_PROVIDER_SITE_OTHER): Payer: Medicare HMO | Admitting: Family Medicine

## 2018-05-25 VITALS — BP 107/54 | HR 116 | Temp 99.8°F | Ht 61.0 in | Wt 252.0 lb

## 2018-05-25 DIAGNOSIS — F3175 Bipolar disorder, in partial remission, most recent episode depressed: Secondary | ICD-10-CM

## 2018-05-25 DIAGNOSIS — Z720 Tobacco use: Secondary | ICD-10-CM | POA: Diagnosis not present

## 2018-05-25 DIAGNOSIS — J329 Chronic sinusitis, unspecified: Secondary | ICD-10-CM | POA: Diagnosis not present

## 2018-05-25 DIAGNOSIS — J441 Chronic obstructive pulmonary disease with (acute) exacerbation: Secondary | ICD-10-CM | POA: Diagnosis not present

## 2018-05-25 DIAGNOSIS — J3089 Other allergic rhinitis: Secondary | ICD-10-CM

## 2018-05-25 DIAGNOSIS — J40 Bronchitis, not specified as acute or chronic: Secondary | ICD-10-CM | POA: Diagnosis not present

## 2018-05-25 DIAGNOSIS — R69 Illness, unspecified: Secondary | ICD-10-CM | POA: Diagnosis not present

## 2018-05-25 MED ORDER — LEVOFLOXACIN 500 MG PO TABS: 500.0000 mg | ORAL_TABLET | Freq: Every day | ORAL | 0 refills | Status: AC

## 2018-05-25 MED ORDER — NICOTINE 21 MG/24HR TD PT24: 21.0000 mg | MEDICATED_PATCH | Freq: Every day | TRANSDERMAL | 0 refills | Status: DC

## 2018-05-25 MED ORDER — LEVOCETIRIZINE DIHYDROCHLORIDE 5 MG PO TABS: 5.0000 mg | ORAL_TABLET | Freq: Every evening | ORAL | 1 refills | Status: DC

## 2018-05-25 MED ORDER — PREDNISONE 20 MG PO TABS
40.0000 mg | ORAL_TABLET | Freq: Every day | ORAL | 0 refills | Status: DC
Start: 2018-05-25 — End: 2018-08-05

## 2018-05-25 MED ORDER — METHYLPREDNISOLONE SODIUM SUCC 125 MG IJ SOLR
125.0000 mg | Freq: Once | INTRAMUSCULAR | Status: AC
Start: 2018-05-25 — End: 2018-05-25
  Administered 2018-05-25: 125 mg via INTRAMUSCULAR

## 2018-05-25 MED ORDER — BUSPIRONE HCL 10 MG PO TABS: 10.0000 mg | ORAL_TABLET | Freq: Three times a day (TID) | ORAL | 3 refills | Status: DC

## 2018-05-25 NOTE — Progress Notes (Signed)
Subjective:    Patient ID: Krystal Burgess, female    DOB: [birth year], 57 y.o.   MRN: [id number]  HPI 57 year old female with severe COPD and chronic sinusitis comes in today complaining of 5 days of sinus symptoms that she feels are moving more into her chest.  She is been using over-the-counter sinus medications and feels like she is actually gotten worse in the last 3 days.  Was last seen for COPD exacerbation after a hospital follow-up about 4 weeks ago. She has been using Tessalon perles.  He says she really never got better from when she was seen about 4 weeks ago.  She still has a lot of green nasal congestion but over the last couple days when she felt it moving into her chest and started coughing up some yellow and thick green sputum she felt like she should come in and be evaluated.  No fevers chills or sweats.  AR- she would like a script for Xyzal since OTC Allegra is too expensive.    Tobacco abuse-she would like a new prescription sent for her nicotine patches to [facility] instead of [facility] instead of trying to get them transferred.  Anxiety-she would like to increase her BuSpar dose and increase up to 3 times a day.  She is been under a lot of stress recently.  Her mother who is currently under hospice care recently signed everything over to her brother and this is been upsetting to the family.  Review of Systems  BP (!) 107/54   Pulse (!) 116   Temp 99.8 F (37.7 C) (Oral)   Ht 5\' 1"  (1.549 m)   Wt 252 lb (114.3 kg)   SpO2 93%   BMI 47.61 kg/m     Allergies  Allergen Reactions  . Aripiprazole     REACTION: muscle jerks  . Augmentin [Amoxicillin-Pot Clavulanate] Other (See Comments)    Gets yeast infection every time   . Fluoxetine Hcl     REACTION: Intolerant  . Geodon [Ziprasidone Hcl] Other (See Comments)    shakes  . Metformin And Related Nausea Only  . Nsaids Other (See Comments)    Upper GI bleed  . Onglyza [Saxagliptin] Nausea Only  . Paroxetine    REACTION: Intolerance    Past Medical History:  Diagnosis Date  . Allergy   . Hyperlipidemia   . Pain   . Thyroid disease     Past Surgical History:  Procedure Laterality Date  . ABDOMINAL HYSTERECTOMY    . CESAREAN SECTION    . CHOLECYSTECTOMY    . lipoma removal     RT shoulder  . LUMBAR SPINE SURGERY  2013   Dr. Manson Passey, L4-5  . pilonydal cyst  1994    Social History   Socioeconomic History  . Marital status: Divorced    Spouse name: Not on file  . Number of children: Not on file  . Years of education: Not on file  . Highest education level: Not on file  Occupational History  . Not on file  Social Needs  . Financial resource strain: Not on file  . Food insecurity:    Worry: Not on file    Inability: Not on file  . Transportation needs:    Medical: Not on file    Non-medical: Not on file  Tobacco Use  . Smoking status: Current Every Day Smoker    Packs/day: 2.50  . Smokeless tobacco: Never Used  Substance and Sexual Activity  . Alcohol use:  Not on file  . Drug use: No  . Sexual activity: Not on file    Comment: s with mother currentlynemployed, on disabilty, divorced, liver  Lifestyle  . Physical activity:    Days per week: Not on file    Minutes per session: Not on file  . Stress: Not on file  Relationships  . Social connections:    Talks on phone: Not on file    Gets together: Not on file    Attends religious service: Not on file    Active member of club or organization: Not on file    Attends meetings of clubs or organizations: Not on file    Relationship status: Not on file  . Intimate partner violence:    Fear of current or ex partner: Not on file    Emotionally abused: Not on file    Physically abused: Not on file    Forced sexual activity: Not on file  Other Topics Concern  . Not on file  Social History Narrative  . Not on file    Family History  Problem Relation Age of Onset  . Diabetes Brother   . Depression Brother        bipolar   . Cancer Brother        throat  . Diabetes Unknown        grandmother  . Depression Mother   . Hyperlipidemia Mother   . Hypertension Mother   . Pancreatic cancer Mother   . Hypertension Father     Outpatient Encounter Medications as of 05/25/2018  Medication Sig  . albuterol (VENTOLIN HFA) 108 (90 Base) MCG/ACT inhaler Inhale 2 puffs into the lungs every 4 (four) hours as needed for wheezing or shortness of breath.  . AMBULATORY NON FORMULARY MEDICATION Medication Name: ONE TOUCH METER. CHECK BLOOD SUGAR TWICE DAILY. DX:E11.9  . amLODipine (NORVASC) 5 MG tablet TAKE 1 TABLET (5 MG TOTAL) BY MOUTH DAILY.  . brimonidine-timolol (COMBIGAN) 0.2-0.5 % ophthalmic solution Place 2 drops into both eyes daily.  . busPIRone (BUSPAR) 10 MG tablet Take 1 tablet (10 mg total) by mouth 3 (three) times daily.  Marland Kitchen CARAFATE 1 GM/10ML suspension TAKE 10 MLS (1 G TOTAL) BY MOUTH 4 (FOUR) TIMES DAILY - WITH MEALS AND AT BEDTIME.  Marland Kitchen diclofenac sodium (VOLTAREN) 1 % GEL APPLE 4G TO AFFECTED AREA UP TO THREE TIMES A DAY  . doxepin (SINEQUAN) 10 MG capsule TAKE ONE CAPSULE AT BEDTIME  . DULoxetine (CYMBALTA) 30 MG capsule TAKE 1 CAPSULE BY MOUTH EVERY DAY  . DULoxetine (CYMBALTA) 60 MG capsule TAKE 1 CAPSULE BY MOUTH EVERY DAY  . fluticasone (FLONASE) 50 MCG/ACT nasal spray PUT 2 SPRAYS INTO EACH NOSTRIL DAILY.  Marland Kitchen Fluticasone-Umeclidin-Vilant (TRELEGY ELLIPTA) 100-62.5-25 MCG/INH AEPB Inhale 1 Inhaler daily into the lungs.  . furosemide (LASIX) 20 MG tablet TAKE 1 TABLET BY MOUTH EVERY DAY  . gabapentin (NEURONTIN) 600 MG tablet Take 600 mg by mouth 3 (three) times daily.  Marland Kitchen glucose blood test strip CHECK BLOOD SUGAR TWICE DAILY. DX: E11.9  . ipratropium (ATROVENT) 0.06 % nasal spray Place 2 sprays into both nostrils 4 (four) times daily.  Marland Kitchen ipratropium-albuterol (DUONEB) 0.5-2.5 (3) MG/3ML SOLN USE 1 VIAL IN NEBULIZER EVERY 2 TO 3 HOURS  . JANUVIA 100 MG tablet TAKE 1 TABLET EVERY DAY  . levocetirizine  (XYZAL) 5 MG tablet Take 1 tablet (5 mg total) by mouth every evening.  Marland Kitchen levofloxacin (LEVAQUIN) 500 MG tablet Take 1 tablet (500 mg  total) by mouth daily for 10 days.  Marland Kitchen levothyroxine (SYNTHROID, LEVOTHROID) 88 MCG tablet TAKE 1 TABLET (88 MCG TOTAL) BY MOUTH DAILY. APPOINTMENT REQUIRED FOR REFILLS  . lidocaine (LIDODERM) 5 % APPLY UP TO 3 PATCHES TO AFFECTED AREA DAILY PRN FOR 30 DAYS  . losartan (COZAAR) 100 MG tablet TAKE 1 TABLET EVERY DAY -NEEDS APPT. FOR MORE REFILLS  . naproxen (NAPROSYN) 500 MG tablet TAKE 1 TABLET (500 MG TOTAL) BY MOUTH 2 (TWO) TIMES DAILY WITH A MEAL.  . nicotine (NICODERM CQ - DOSED IN MG/24 HOURS) 21 mg/24hr patch Place 1 patch (21 mg total) onto the skin daily.  Marland Kitchen oxyCODONE-acetaminophen (PERCOCET) 7.5-325 MG tablet TAKE 1 TABLET BY MOUTH EVERY 6 HOURS AS NEEDED FOR 30 DAYS  . OXYGEN Inhale 4 L into the lungs.  . pantoprazole (PROTONIX) 40 MG tablet TAKE 1 TABLET BY MOUTH TWICE A DAY  . predniSONE (DELTASONE) 20 MG tablet Take 2 tablets (40 mg total) by mouth daily with breakfast.  . QUEtiapine (SEROQUEL) 300 MG tablet TAKE 1 TABLET (300 MG TOTAL) BY MOUTH 2 (TWO) TIMES DAILY.  . rosuvastatin (CRESTOR) 20 MG tablet Take 1 tablet (20 mg total) by mouth daily. LAST REFILL. LABS NEEDED FOR FUTURE REFILLS.PLEASE SCHEDULE AN APPOINTMENT  . sodium chloride 0.9 % nebulizer solution Take 3 mLs by nebulization as needed for wheezing.  . theophylline (THEODUR) 300 MG 12 hr tablet Take 1 tablet (300 mg total) by mouth 2 (two) times daily.  Marland Kitchen UNABLE TO FIND   . [DISCONTINUED] busPIRone (BUSPAR) 5 MG tablet TAKE 1 TABLET BY MOUTH TWICE A DAY  . [DISCONTINUED] fexofenadine (ALLEGRA) 180 MG tablet Take 1 tablet (180 mg total) by mouth daily.  . [DISCONTINUED] nicotine (NICODERM CQ - DOSED IN MG/24 HOURS) 21 mg/24hr patch Place 1 patch (21 mg total) onto the skin daily.  . [DISCONTINUED] predniSONE (DELTASONE) 20 MG tablet Take 2 tablets (40 mg total) by mouth daily with  breakfast.  . [EXPIRED] methylPREDNISolone sodium succinate (SOLU-MEDROL) 125 mg/2 mL injection 125 mg    No facility-administered encounter medications on file as of 05/25/2018.          Objective:   Physical Exam  Constitutional: She is oriented to person, place, and time. She appears well-developed and well-nourished.  HENT:  Head: Normocephalic and atraumatic.  Right Ear: External ear normal.  Left Ear: External ear normal.  Nose: Nose normal.  Mouth/Throat: Oropharynx is clear and moist.  TMs and canals are clear.   Eyes: Pupils are equal, round, and reactive to light. Conjunctivae and EOM are normal.  Neck: Neck supple. No thyromegaly present.  Cardiovascular: Normal rate, regular rhythm and normal heart sounds.  Pulmonary/Chest: Effort normal and breath sounds normal. She has no wheezes.  Diffuse coarse breath sounds.  Lymphadenopathy:    She has no cervical adenopathy.  Neurological: She is alert and oriented to person, place, and time.  Skin: Skin is warm and dry.  Psychiatric: She has a normal mood and affect.        Assessment & Plan:  Acute sinobronchitis-we will go ahead and treat with IM Solu-Medrol 125 and I am going to go ahead and put her on Levaquin but did warn about potential risks with fluoroquinolones.  We will go ahead and put her on oral prednisone which she can start tomorrow morning.  If not significantly better than please let me know.  Allergic rhinitis-we will send a prescription for Xyzal.   Tobacco abuse-sent nicotine patches over  to [facility] where she can get them cheaper for cash today.  Bipolar disorder with increased anxiety-we will increase BuSpar to 10 mg and increase it to 3 times a day.

## 2018-05-28 ENCOUNTER — Other Ambulatory Visit: Payer: Self-pay | Admitting: Family Medicine

## 2018-05-28 DIAGNOSIS — J449 Chronic obstructive pulmonary disease, unspecified: Secondary | ICD-10-CM | POA: Diagnosis not present

## 2018-06-03 ENCOUNTER — Other Ambulatory Visit: Payer: Self-pay | Admitting: Family Medicine

## 2018-06-06 ENCOUNTER — Other Ambulatory Visit: Payer: Self-pay | Admitting: Family Medicine

## 2018-06-11 ENCOUNTER — Telehealth: Payer: Self-pay

## 2018-06-11 DIAGNOSIS — F3175 Bipolar disorder, in partial remission, most recent episode depressed: Secondary | ICD-10-CM

## 2018-06-11 DIAGNOSIS — F99 Mental disorder, not otherwise specified: Secondary | ICD-10-CM

## 2018-06-11 DIAGNOSIS — F5105 Insomnia due to other mental disorder: Secondary | ICD-10-CM

## 2018-06-11 NOTE — Telephone Encounter (Signed)
Pt called with a couple of things.   1. Pt wants referral to behavioral health. She has been referred before, but never actually went. Pt states she has new ins and now wants new referral for downstairs.  2. Pt also wanted to let Dr Linford Arnold know that she is having to keep her oxygen on all the time. Per pt, when she gets up to walk around her heart rate goes up to 140 and her oxygen drops to 88.   I advised pt to seek medical attention at Urgent Care or ER, but declines because she does not think they will do anything. Pt states "I know the feeling and if it gets worse, I'll go to the hospital".   Pt has OV with Dr Linford Arnold on 06-22-18.

## 2018-06-14 NOTE — Telephone Encounter (Signed)
Okay, please place referral to behavioral health for downstairs.  Please just double check and call her today if you have time to make sure that she is feeling a little bit better.

## 2018-06-14 NOTE — Telephone Encounter (Signed)
Attempted to contact Pt, no answer. Left generic VM that a referral was going to be placed. Requested callback for status update.   Referral placed.

## 2018-06-16 ENCOUNTER — Other Ambulatory Visit: Payer: Self-pay | Admitting: Family Medicine

## 2018-06-16 MED ORDER — SUCRALFATE 1 GM/10ML PO SUSP
ORAL | 0 refills | Status: DC
Start: 2018-06-16 — End: 2018-07-14

## 2018-06-16 NOTE — Addendum Note (Signed)
Addended by: Jed Limerick on: 06/16/2018 03:35 PM   Modules accepted: Orders

## 2018-06-16 NOTE — Telephone Encounter (Signed)
Pt returned call and left msg.  I called pt and left her a msg and let her know referral was placed.   Sent RF for Carafate, pet pt's request.

## 2018-06-22 ENCOUNTER — Ambulatory Visit: Payer: Medicare HMO | Admitting: Family Medicine

## 2018-06-23 ENCOUNTER — Other Ambulatory Visit: Payer: Self-pay | Admitting: Family Medicine

## 2018-06-27 DIAGNOSIS — J449 Chronic obstructive pulmonary disease, unspecified: Secondary | ICD-10-CM | POA: Diagnosis not present

## 2018-06-30 ENCOUNTER — Other Ambulatory Visit: Payer: Self-pay | Admitting: Family Medicine

## 2018-06-30 DIAGNOSIS — Z79899 Other long term (current) drug therapy: Secondary | ICD-10-CM | POA: Diagnosis not present

## 2018-06-30 DIAGNOSIS — R202 Paresthesia of skin: Secondary | ICD-10-CM | POA: Diagnosis not present

## 2018-06-30 DIAGNOSIS — M542 Cervicalgia: Secondary | ICD-10-CM | POA: Diagnosis not present

## 2018-06-30 DIAGNOSIS — G603 Idiopathic progressive neuropathy: Secondary | ICD-10-CM | POA: Diagnosis not present

## 2018-06-30 DIAGNOSIS — M5442 Lumbago with sciatica, left side: Secondary | ICD-10-CM | POA: Diagnosis not present

## 2018-06-30 DIAGNOSIS — M5441 Lumbago with sciatica, right side: Secondary | ICD-10-CM | POA: Diagnosis not present

## 2018-06-30 DIAGNOSIS — R51 Headache: Secondary | ICD-10-CM | POA: Diagnosis not present

## 2018-07-05 ENCOUNTER — Other Ambulatory Visit: Payer: Medicare HMO

## 2018-07-06 ENCOUNTER — Other Ambulatory Visit: Payer: Self-pay | Admitting: Family Medicine

## 2018-07-08 ENCOUNTER — Other Ambulatory Visit: Payer: Self-pay | Admitting: Physician Assistant

## 2018-07-08 DIAGNOSIS — J441 Chronic obstructive pulmonary disease with (acute) exacerbation: Secondary | ICD-10-CM

## 2018-07-12 ENCOUNTER — Other Ambulatory Visit: Payer: Medicare HMO

## 2018-07-14 ENCOUNTER — Other Ambulatory Visit: Payer: Self-pay | Admitting: Family Medicine

## 2018-07-19 ENCOUNTER — Other Ambulatory Visit: Payer: Self-pay | Admitting: Family Medicine

## 2018-07-21 ENCOUNTER — Ambulatory Visit (INDEPENDENT_AMBULATORY_CARE_PROVIDER_SITE_OTHER): Payer: Medicare HMO | Admitting: Osteopathic Medicine

## 2018-07-21 ENCOUNTER — Encounter: Payer: Self-pay | Admitting: Osteopathic Medicine

## 2018-07-21 VITALS — BP 130/84 | HR 109 | Temp 98.1°F | Wt 234.5 lb

## 2018-07-21 DIAGNOSIS — J329 Chronic sinusitis, unspecified: Secondary | ICD-10-CM | POA: Diagnosis not present

## 2018-07-21 DIAGNOSIS — J322 Chronic ethmoidal sinusitis: Secondary | ICD-10-CM

## 2018-07-21 MED ORDER — IPRATROPIUM-ALBUTEROL 0.5-2.5 (3) MG/3ML IN SOLN: 3.0000 mL | RESPIRATORY_TRACT | 1 refills | Status: DC

## 2018-07-21 MED ORDER — FLUTICASONE PROPIONATE 50 MCG/ACT NA SUSP: 2.0000 | Freq: Every day | NASAL | 3 refills | Status: DC

## 2018-07-21 MED ORDER — DOXYCYCLINE HYCLATE 100 MG PO TABS: 100.0000 mg | ORAL_TABLET | Freq: Two times a day (BID) | ORAL | 0 refills | Status: DC

## 2018-07-21 MED ORDER — METHYLPREDNISOLONE SODIUM SUCC 125 MG IJ SOLR
125.0000 mg | Freq: Once | INTRAMUSCULAR | Status: AC
  Administered 2018-07-21: 125 mg via INTRAMUSCULAR

## 2018-07-21 NOTE — Progress Notes (Signed)
HPI: Krystal Burgess is a 57 y.o. female who  has a past medical history of Allergy, Hyperlipidemia, Pain, and Thyroid disease.  she presents to [facility] today, 07/21/18,  for chief complaint of: Sinus problem   Ongoing "what seems like forever" sinus infection. Gets steroids for it and it gets better but then comes back once she finishes the medicine. Clogged sinuses and drainage, occasional bloody nose, feels like she can't breathe.   On record review: multiple visit for COPD and sinus issues.  Reports recently on steroids for muscular skeletal issue last week, chest feels may be a bit tight but overall cough/breathing is stable  Previously referred to ENT, had to cancel that a visit due to an acute illness, has not rescheduled yet  Nebulizer medications - went for refill but was told she was tooe arly for this when she went in for a refill. She is using it several times a day every day.     Past medical, surgical, social and family history reviewed:  Patient Active Problem List   Diagnosis Date Noted  . Acute non-recurrent pansinusitis 01/29/2018  . Chronic ethmoidal sinusitis 01/29/2018  . COPD exacerbation (HCC) 09/09/2017  . Chronic insomnia 01/30/2017  . Pulmonary nodule, RML, needs noncontrast CT in February 2019 01/26/2017  . Lumbar degenerative disc disease 08/14/2016  . Diabetes type 2, controlled (HCC) 09/07/2014  . Acute angle-closure glaucoma 10/03/2013  . Bilateral carpal tunnel syndrome 03/14/2013  . Bipolar disorder (HCC) 01/24/2013  . 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 Vitamin B 12 deficiency 06/24/2011  . ALLERGIC RHINITIS 08/17/2009  . UNSPECIFIED VITAMIN D DEFICIENCY 07/13/2009  . Severe obesity (BMI >= 40) (HCC) 05/03/2009  . HYPERTENSION, BENIGN 03/26/2009  . LEG EDEMA, BILATERAL 03/26/2009  . Hypothyroidism 02/20/2009  . HYPERLIPIDEMIA 12/12/2008  . SMOKER 12/12/2008  . DEPRESSION 12/12/2008  .  NEUROPATHY 12/12/2008  . 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 COPD with asthma (HCC) 12/12/2008  . Spinal stenosis of lumbar region 12/12/2008    Past Surgical History:  Procedure Laterality Date  . ABDOMINAL HYSTERECTOMY    . CESAREAN SECTION    . CHOLECYSTECTOMY    . lipoma removal     RT shoulder  . LUMBAR SPINE SURGERY  2013   Dr. Manson Passey, L4-5  . pilonydal cyst  1994    Social History   Tobacco Use  . Smoking status: Current Every Day Smoker    Packs/day: 2.50  . Smokeless tobacco: Never Used  Substance Use Topics  . Alcohol use: Not on file    Family History  Problem Relation Age of Onset  . Diabetes Brother   . Depression Brother        bipolar  . Cancer Brother        throat  . Diabetes Unknown        grandmother  . Depression Mother   . Hyperlipidemia Mother   . Hypertension Mother   . Pancreatic cancer Mother   . Hypertension Father      Current medication list and allergy/intolerance information reviewed:    Current Outpatient Medications  Medication Sig Dispense Refill  . albuterol (VENTOLIN HFA) 108 (90 Base) MCG/ACT inhaler Inhale 2 puffs into the lungs every 4 (four) hours as needed for wheezing or shortness of breath. 18 g 11  . AMBULATORY NON FORMULARY MEDICATION Medication Name: ONE TOUCH METER. CHECK BLOOD SUGAR TWICE DAILY. DX:E11.9 1 each 0  . amLODipine (NORVASC) 5 MG tablet TAKE 1 TABLET (  5 MG TOTAL) BY MOUTH DAILY. 90 tablet 2  . brimonidine-timolol (COMBIGAN) 0.2-0.5 % ophthalmic solution Place 2 drops into both eyes daily.    . busPIRone (BUSPAR) 10 MG tablet TAKE 1 TABLET BY MOUTH THREE TIMES A DAY 270 tablet 2  . diclofenac sodium (VOLTAREN) 1 % GEL APPLE 4G TO AFFECTED AREA UP TO THREE TIMES A DAY  3  . doxepin (SINEQUAN) 10 MG capsule TAKE ONE CAPSULE AT BEDTIME 30 capsule 5  . DULoxetine (CYMBALTA) 30 MG capsule TAKE 1 CAPSULE BY MOUTH EVERY DAY 90 capsule 3  . DULoxetine (CYMBALTA) 60 MG capsule TAKE 1 CAPSULE BY MOUTH EVERY DAY 90  capsule 3  . fluticasone (FLONASE) 50 MCG/ACT nasal spray PUT 2 SPRAYS INTO EACH NOSTRIL DAILY. 16 g 1  . Fluticasone-Umeclidin-Vilant (TRELEGY ELLIPTA) 100-62.5-25 MCG/INH AEPB Inhale 1 Inhaler daily into the lungs. 3 each PRN  . furosemide (LASIX) 20 MG tablet TAKE 1 TABLET BY MOUTH EVERY DAY 30 tablet 0  . gabapentin (NEURONTIN) 600 MG tablet Take 600 mg by mouth 3 (three) times daily.  6  . glucose blood test strip CHECK BLOOD SUGAR TWICE DAILY. DX: E11.9 100 each 12  . ipratropium (ATROVENT) 0.06 % nasal spray Place 2 sprays into both nostrils 4 (four) times daily. 15 mL 12  . ipratropium-albuterol (DUONEB) 0.5-2.5 (3) MG/3ML SOLN USE 1 VIAL IN NEBULIZER EVERY 2 TO 3 HOURS 540 mL 1  . JANUVIA 100 MG tablet TAKE 1 TABLET EVERY DAY 30 tablet 2  . levocetirizine (XYZAL) 5 MG tablet Take 1 tablet (5 mg total) by mouth every evening. 90 tablet 1  . levothyroxine (SYNTHROID, LEVOTHROID) 88 MCG tablet Take 1 tablet (88 mcg total) by mouth daily. 30 DAY SUPPLY GIVEN.MUST HAVE LAB WORK DONE FOR REFILLS 30 tablet 0  . lidocaine (LIDODERM) 5 % APPLY UP TO 3 PATCHES TO AFFECTED AREA DAILY PRN FOR 30 DAYS  3  . losartan (COZAAR) 100 MG tablet TAKE 1 TABLET EVERY DAY -NEEDS APPT. FOR MORE REFILLS 90 tablet 1  . naproxen (NAPROSYN) 500 MG tablet TAKE 1 TABLET (500 MG TOTAL) BY MOUTH 2 (TWO) TIMES DAILY WITH A MEAL. 60 tablet 4  . nicotine (NICODERM CQ - DOSED IN MG/24 HOURS) 21 mg/24hr patch Place 1 patch (21 mg total) onto the skin daily. 28 patch 0  . nystatin (MYCOSTATIN) 100000 UNIT/ML suspension TAKE 1 TEASPOONFUL 4 TIMES A DAY 120 mL 0  . oxyCODONE-acetaminophen (PERCOCET) 7.5-325 MG tablet TAKE 1 TABLET BY MOUTH EVERY 6 HOURS AS NEEDED FOR 30 DAYS  0  . OXYGEN Inhale 4 L into the lungs.    . pantoprazole (PROTONIX) 40 MG tablet TAKE 1 TABLET BY MOUTH TWICE A DAY 120 tablet 0  . predniSONE (DELTASONE) 20 MG tablet Take 2 tablets (40 mg total) by mouth daily with breakfast. 10 tablet 0  . promethazine  (PHENERGAN) 25 MG tablet TAKE 1 TABLET EVERY 8 HOURS AS NEEDED FOR NAUSEA AND VOMITING 25 tablet 2  . QUEtiapine (SEROQUEL) 300 MG tablet TAKE 1 TABLET (300 MG TOTAL) BY MOUTH 2 (TWO) TIMES DAILY. 180 tablet 1  . rosuvastatin (CRESTOR) 20 MG tablet TAKE 1 TABLET BY MOUTH DAILY. LAST REFILL. LABS NEEDED. SCHEDULE APPOINTMENT. 90 tablet 0  . sodium chloride 0.9 % nebulizer solution Take 3 mLs by nebulization as needed for wheezing. 90 mL 12  . sucralfate (CARAFATE) 1 GM/10ML suspension TAKE 10 MLS (1 G TOTAL) BY MOUTH 4 (FOUR) TIMES DAILY - WITH MEALS AND  AT BEDTIME. 420 mL 0  . sucralfate (CARAFATE) 1 GM/10ML suspension TAKE 10 MLS (1 G TOTAL) BY MOUTH 4 (FOUR) TIMES DAILY - WITH MEALS AND AT BEDTIME. 420 mL 0  . theophylline (THEODUR) 300 MG 12 hr tablet Take 1 tablet (300 mg total) by mouth 2 (two) times daily. 60 tablet 3  . UNABLE TO FIND      No current facility-administered medications for this visit.     Allergies  Allergen Reactions  . Aripiprazole Other (See Comments)    REACTION: muscle jerks   . Fluoxetine Hcl Other (See Comments)    REACTION: Intolerant  . Nsaids Other (See Comments)    Upper GI bleed   . Paroxetine Other (See Comments)    REACTION: Intolerance   . Ziprasidone Hcl Other (See Comments)    shakes   . Augmentin [Amoxicillin-Pot Clavulanate] Other (See Comments)    Gets yeast infection every time   . Fluoxetine Hcl     REACTION: Intolerant  . Metformin And Related Nausea Only  . Onglyza [Saxagliptin] Nausea Only      Review of Systems:  Constitutional:  No  fever, no chills, No recent illness, No unintentional weight changes. No significant fatigue.   HEENT: +L sinus headache, no vision change, no hearing change, No sore throat, +sinus pressure  Cardiac: No  chest pain, No  pressure, No palpitations  Respiratory:  No  shortness of breath. +Cough  Gastrointestinal: No  abdominal pain  Musculoskeletal: No new myalgia/arthralgia  Neurologic: No   weakness, No  dizziness  Exam:  BP 130/84 (BP Location: Left Arm, Patient Position: Sitting, Cuff Size: Normal)   Pulse (!) 109   Temp 98.1 F (36.7 C) (Oral)   Wt 234 lb 8 oz (106.4 kg)   SpO2 94%   BMI 44.31 kg/m   Constitutional: VS see above. General Appearance: alert, well-developed, well-nourished, NAD  Ears, Nose, Mouth, Throat: MMM, Normal external inspection ears/nares/mouth/lips/gums. TM normal bilaterally. Pharynx/tonsils no erythema, no exudate. Nasal mucosa normal.   Neck: No masses, trachea midline.  Respiratory: Normal respiratory effort. no wheeze, no rhonchi, no rales, diminished breath sounds in all lung fields  Cardiovascular: S1/S2 normal, no murmur, no rub/gallop auscultated. RRR. No lower extremity edema.   Psychiatric: Normal judgment/insight. Normal mood and affect.     ASSESSMENT/PLAN: The primary encounter diagnosis was Chronic ethmoidal sinusitis. A diagnosis of Recurrent sinusitis was also pertinent to this visit.   Meds ordered this encounter  Medications  . doxycycline (VIBRA-TABS) 100 MG tablet    Sig: Take 1 tablet (100 mg total) by mouth 2 (two) times daily.    Dispense:  14 tablet    Refill:  0  . fluticasone (FLONASE) 50 MCG/ACT nasal spray    Sig: Place 2 sprays into both nostrils daily.    Dispense:  16 g    Refill:  3  . ipratropium-albuterol (DUONEB) 0.5-2.5 (3) MG/3ML SOLN    Sig: Take 3 mLs by nebulization every 2 (two) hours while awake.    Dispense:  1080 mL    Refill:  1  . methylPREDNISolone sodium succinate (SOLU-MEDROL) 125 mg/2 mL injection 125 mg     Patient Instructions   ENT: [phone], would call today to get rescheduled appointment  Let's also get a CT scan of the sinuses to evaluate for polyps, cyst, or other anatomic problems that may be predisposing you to frequent infections  Would recommend Flonase nasal spray to help decrease nasal inflammation  Congratulations on  cutting back smoking!  Quitting  smoking is going to be the best thing to do for your overall health, please let us know if you need any help   Will trial antibiotics  Nebulizer medicines were refilled, please let us know if there are any issues with the pharmacy     Visit summary with medication list and pertinent instructions was printed for patient to review. All questions at time of visit were answered - patient instructed to contact office with any additional concerns. ER/RTC precautions were reviewed with the patient.   Follow-up plan: Return for Recheck with PCP to follow-up on diabetes, sinus issues, other chronic medical issues.  Note: Total time spent 25 minutes, greater than 50% of the visit was spent face-to-face counseling and coordinating care for the following: The primary encounter diagnosis was Chronic ethmoidal sinusitis. A diagnosis of Recurrent sinusitis was also pertinent to this visit.Marland Kitchen  Please note: voice recognition software was used to produce this document, and typos may escape review. Please contact Dr. Lyn Hollingshead for any needed clarifications.

## 2018-07-21 NOTE — Patient Instructions (Addendum)
   ENT: [phone], would call today to get rescheduled appointment  Let's also get a CT scan of the sinuses to evaluate for polyps, cyst, or other anatomic problems that may be predisposing you to frequent infections  Would recommend Flonase nasal spray to help decrease nasal inflammation  Congratulations on cutting back smoking!  Quitting smoking is going to be the best thing to do for your overall health, please let us know if you need any help   Will trial antibiotics  Nebulizer medicines were refilled, please let us know if there are any issues with the pharmacy

## 2018-07-22 ENCOUNTER — Other Ambulatory Visit: Payer: Self-pay | Admitting: Family Medicine

## 2018-07-22 ENCOUNTER — Ambulatory Visit (INDEPENDENT_AMBULATORY_CARE_PROVIDER_SITE_OTHER): Payer: Medicare HMO | Admitting: Psychiatry

## 2018-07-22 ENCOUNTER — Encounter (HOSPITAL_COMMUNITY): Payer: Self-pay | Admitting: Psychiatry

## 2018-07-22 ENCOUNTER — Other Ambulatory Visit: Payer: Self-pay

## 2018-07-22 VITALS — BP 136/84 | HR 104 | Ht 61.0 in | Wt 234.0 lb

## 2018-07-22 DIAGNOSIS — F331 Major depressive disorder, recurrent, moderate: Secondary | ICD-10-CM

## 2018-07-22 DIAGNOSIS — R69 Illness, unspecified: Secondary | ICD-10-CM | POA: Diagnosis not present

## 2018-07-22 DIAGNOSIS — F5104 Psychophysiologic insomnia: Secondary | ICD-10-CM

## 2018-07-22 DIAGNOSIS — F411 Generalized anxiety disorder: Secondary | ICD-10-CM | POA: Diagnosis not present

## 2018-07-22 DIAGNOSIS — F0630 Mood disorder due to known physiological condition, unspecified: Secondary | ICD-10-CM

## 2018-07-22 DIAGNOSIS — F3132 Bipolar disorder, current episode depressed, moderate: Secondary | ICD-10-CM

## 2018-07-22 MED ORDER — LAMOTRIGINE 25 MG PO TABS: 25.0000 mg | ORAL_TABLET | Freq: Every day | ORAL | 0 refills | Status: DC
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
## 2018-07-22 NOTE — Patient Instructions (Signed)
Refer to therapy 

## 2018-07-23 ENCOUNTER — Other Ambulatory Visit: Payer: Self-pay | Admitting: Family Medicine

## 2018-07-26 NOTE — Telephone Encounter (Signed)
Routed to pcp for signature.Ova Gillentine Lynetta, CMA  

## 2018-07-28 DIAGNOSIS — J449 Chronic obstructive pulmonary disease, unspecified: Secondary | ICD-10-CM | POA: Diagnosis not present

## 2018-07-31 ENCOUNTER — Other Ambulatory Visit: Payer: Self-pay | Admitting: Family Medicine

## 2018-08-04 ENCOUNTER — Other Ambulatory Visit: Payer: Self-pay | Admitting: Family Medicine

## 2018-08-05 ENCOUNTER — Other Ambulatory Visit: Payer: Self-pay | Admitting: *Deleted

## 2018-08-10 ENCOUNTER — Ambulatory Visit: Payer: Medicare HMO | Admitting: Family Medicine

## 2018-08-13 ENCOUNTER — Other Ambulatory Visit: Payer: Self-pay | Admitting: Family Medicine

## 2018-08-16 ENCOUNTER — Other Ambulatory Visit: Payer: Medicare HMO

## 2018-08-17 ENCOUNTER — Encounter: Payer: Self-pay | Admitting: Family Medicine

## 2018-08-17 ENCOUNTER — Ambulatory Visit (INDEPENDENT_AMBULATORY_CARE_PROVIDER_SITE_OTHER): Payer: Medicare HMO | Admitting: Family Medicine

## 2018-08-17 VITALS — BP 131/72 | HR 114 | Ht 61.0 in | Wt 236.0 lb

## 2018-08-17 DIAGNOSIS — R112 Nausea with vomiting, unspecified: Secondary | ICD-10-CM | POA: Diagnosis not present

## 2018-08-17 DIAGNOSIS — I10 Essential (primary) hypertension: Secondary | ICD-10-CM | POA: Diagnosis not present

## 2018-08-17 DIAGNOSIS — F3132 Bipolar disorder, current episode depressed, moderate: Secondary | ICD-10-CM

## 2018-08-17 DIAGNOSIS — E038 Other specified hypothyroidism: Secondary | ICD-10-CM

## 2018-08-17 DIAGNOSIS — E099 Drug or chemical induced diabetes mellitus without complications: Secondary | ICD-10-CM | POA: Diagnosis not present

## 2018-08-17 DIAGNOSIS — T380X5A Adverse effect of glucocorticoids and synthetic analogues, initial encounter: Secondary | ICD-10-CM | POA: Diagnosis not present

## 2018-08-17 DIAGNOSIS — J340 Abscess, furuncle and carbuncle of nose: Secondary | ICD-10-CM | POA: Diagnosis not present

## 2018-08-17 DIAGNOSIS — J322 Chronic ethmoidal sinusitis: Secondary | ICD-10-CM | POA: Diagnosis not present

## 2018-08-17 DIAGNOSIS — Z23 Encounter for immunization: Secondary | ICD-10-CM

## 2018-08-17 DIAGNOSIS — R69 Illness, unspecified: Secondary | ICD-10-CM | POA: Diagnosis not present

## 2018-08-17 LAB — POCT GLYCOSYLATED HEMOGLOBIN (HGB A1C): HEMOGLOBIN A1C: 5.7 % — AB (ref 4.0–5.6)

## 2018-08-17 LAB — POCT UA - MICROALBUMIN
Albumin/Creatinine Ratio, Urine, POC: <30
Creatinine, POC: 100 mg/dL
Microalbumin Ur, POC: 30 mg/L

## 2018-08-17 MED ORDER — MUPIROCIN 2 % EX OINT: TOPICAL_OINTMENT | CUTANEOUS | 0 refills | Status: DC

## 2018-08-17 MED ORDER — PROMETHAZINE HCL 25 MG PO TABS: ORAL_TABLET | ORAL | 2 refills | Status: DC

## 2018-08-17 MED ORDER — SUCRALFATE 1 GM/10ML PO SUSP: ORAL | 3 refills | Status: DC

## 2018-08-17 NOTE — Patient Instructions (Addendum)
Hold you Doxepin for sleep. If not really helping and stop it completely and we will remove it from your medication list.  Just let me know. Please schedule a follow up eye exam this year with Dr. Hardie Shackleton.

## 2018-08-17 NOTE — Progress Notes (Signed)
Subjective:    CC: F/U DM, BP and sinus sxs   HPI:  57 year old female is here today for follow-up chronic sinusitis-she was actually seen July 31 and most 4 weeks ago for persistent sinus symptoms that I had seen her for in June as well as May in April.  She had recommended a CT sinus which she has not gone for yet.  She also did have a follow-up ENT appointment had to cancel it because she was sick. She did complete  Hypertension- Pt denies chest pain, SOB, dizziness, or heart palpitations.  Taking meds as directed w/o problems.  Denies medication side effects.    Diabetes - no hypoglycemic events. No wounds or sores that are not healing well. No increased thirst or urination. Checking glucose at home. Taking medications as prescribed without any side effects.  She has 1.5 weeks of stomach pain, burning and nausea.  + dec appetite. She has been vomiting some.  Really thinks it stress related.  Her mother signed her house over to her and that her mother moved in with her brother next door.  Her mom is under hospice but now her brother is claiming that she still her house from her.  And posting it on social media.  She has been using her Carafate and has gone through her Phenergan and would like refills on both.  She says even when she starts to eat she just feels nauseated and does not want to finish eating.  She has been getting some sores on inside of her nose that just seem to not be healing.  She is been using petroleum jelly on them.   BP 131/72   Pulse (!) 114   Ht 5\' 1"  (1.549 m)   Wt 236 lb (107 kg)   SpO2 93%   BMI 44.59 kg/m     Allergies  Allergen Reactions  . Aripiprazole Other (See Comments)    REACTION: muscle jerks   . Fluoxetine Hcl Other (See Comments)    REACTION: Intolerant  . Nsaids Other (See Comments)    Upper GI bleed   . Paroxetine Other (See Comments)    REACTION: Intolerance   . Ziprasidone Hcl Other (See Comments)    shakes   . Augmentin  [Amoxicillin-Pot Clavulanate] Other (See Comments)    Gets yeast infection every time   . Fluoxetine Hcl     REACTION: Intolerant  . Metformin And Related Nausea Only  . Onglyza [Saxagliptin] Nausea Only    Past Medical History:  Diagnosis Date  . Allergy   . Hyperlipidemia   . Pain   . Thyroid disease     Past Surgical History:  Procedure Laterality Date  . ABDOMINAL HYSTERECTOMY    . CESAREAN SECTION    . CHOLECYSTECTOMY    . lipoma removal     RT shoulder  . LUMBAR SPINE SURGERY  2013   Dr. Manson Passey, L4-5  . pilonydal cyst  1994    Social History   Socioeconomic History  . Marital status: Divorced    Spouse name: Not on file  . Number of children: Not on file  . Years of education: Not on file  . Highest education level: Not on file  Occupational History  . Not on file  Social Needs  . Financial resource strain: Not on file  . Food insecurity:    Worry: Not on file    Inability: Not on file  . Transportation needs:    Medical: Not on file  Non-medical: Not on file  Tobacco Use  . Smoking status: Current Every Day Smoker    Packs/day: 1.00  . Smokeless tobacco: Never Used  Substance and Sexual Activity  . Alcohol use: Not Currently  . Drug use: No  . Sexual activity: Not on file    Comment: s with mother currentlynemployed, on disabilty, divorced, liver  Lifestyle  . Physical activity:    Days per week: Not on file    Minutes per session: Not on file  . Stress: Not on file  Relationships  . Social connections:    Talks on phone: Not on file    Gets together: Not on file    Attends religious service: Not on file    Active member of club or organization: Not on file    Attends meetings of clubs or organizations: Not on file    Relationship status: Not on file  . Intimate partner violence:    Fear of current or ex partner: Not on file    Emotionally abused: Not on file    Physically abused: Not on file    Forced sexual activity: Not on file   Other Topics Concern  . Not on file  Social History Narrative  . Not on file    Family History  Problem Relation Age of Onset  . Diabetes Brother   . Depression Brother        bipolar  . Cancer Brother        throat  . Diabetes Unknown        grandmother  . Depression Mother   . Hyperlipidemia Mother   . Hypertension Mother   . Pancreatic cancer Mother   . Hypertension Father     Outpatient Encounter Medications as of 08/17/2018  Medication Sig  . albuterol (VENTOLIN HFA) 108 (90 Base) MCG/ACT inhaler Inhale 2 puffs into the lungs every 4 (four) hours as needed for wheezing or shortness of breath.  . AMBULATORY NON FORMULARY MEDICATION Medication Name: ONE TOUCH METER. CHECK BLOOD SUGAR TWICE DAILY. DX:E11.9  . amLODipine (NORVASC) 5 MG tablet TAKE 1 TABLET (5 MG TOTAL) BY MOUTH DAILY.  . brimonidine-timolol (COMBIGAN) 0.2-0.5 % ophthalmic solution Place 2 drops into both eyes daily.  . diclofenac sodium (VOLTAREN) 1 % GEL APPLE 4G TO AFFECTED AREA UP TO THREE TIMES A DAY  . doxepin (SINEQUAN) 10 MG capsule TAKE ONE CAPSULE AT BEDTIME  . DULoxetine (CYMBALTA) 30 MG capsule TAKE 1 CAPSULE BY MOUTH EVERY DAY  . DULoxetine (CYMBALTA) 60 MG capsule TAKE 1 CAPSULE BY MOUTH EVERY DAY  . fluticasone (FLONASE) 50 MCG/ACT nasal spray Place 2 sprays into both nostrils daily.  . Fluticasone-Umeclidin-Vilant (TRELEGY ELLIPTA) 100-62.5-25 MCG/INH AEPB Inhale 1 Inhaler daily into the lungs.  . furosemide (LASIX) 20 MG tablet TAKE 1 TABLET BY MOUTH EVERY DAY  . gabapentin (NEURONTIN) 600 MG tablet Take 600 mg by mouth 3 (three) times daily.  Marland Kitchen glucose blood test strip CHECK BLOOD SUGAR TWICE DAILY. DX: E11.9  . ipratropium (ATROVENT) 0.06 % nasal spray Place 2 sprays into both nostrils 4 (four) times daily.  Marland Kitchen ipratropium-albuterol (DUONEB) 0.5-2.5 (3) MG/3ML SOLN Take 3 mLs by nebulization every 2 (two) hours while awake.  Marland Kitchen JANUVIA 100 MG tablet TAKE 1 TABLET EVERY DAY  . levocetirizine  (XYZAL) 5 MG tablet Take 1 tablet (5 mg total) by mouth every evening.  Marland Kitchen levothyroxine (SYNTHROID, LEVOTHROID) 88 MCG tablet Take 1 tablet (88 mcg total) by mouth daily. 30 DAY SUPPLY  GIVEN.MUST HAVE LAB WORK DONE FOR REFILLS  . lidocaine (LIDODERM) 5 % APPLY UP TO 3 PATCHES TO AFFECTED AREA DAILY PRN FOR 30 DAYS  . losartan (COZAAR) 100 MG tablet TAKE 1 TABLET EVERY DAY -NEEDS APPT. FOR MORE REFILLS  . naproxen (NAPROSYN) 500 MG tablet TAKE 1 TABLET (500 MG TOTAL) BY MOUTH 2 (TWO) TIMES DAILY WITH A MEAL.  Marland Kitchen nystatin (MYCOSTATIN) 100000 UNIT/ML suspension TAKE 1 TEASPOONFUL 4 TIMES A DAY  . oxyCODONE-acetaminophen (PERCOCET) 7.5-325 MG tablet TAKE 1 TABLET BY MOUTH EVERY 6 HOURS AS NEEDED FOR 30 DAYS  . OXYGEN Inhale 4 L into the lungs.  . pantoprazole (PROTONIX) 40 MG tablet Take 1 tablet (40 mg total) by mouth 2 (two) times daily.  . promethazine (PHENERGAN) 25 MG tablet TAKE 1 TABLET EVERY 8 HOURS AS NEEDED FOR NAUSEA AND VOMITING  . QUEtiapine (SEROQUEL) 300 MG tablet TAKE 1 TABLET (300 MG TOTAL) BY MOUTH 2 (TWO) TIMES DAILY.  . rosuvastatin (CRESTOR) 20 MG tablet TAKE 1 TABLET BY MOUTH DAILY. LAST REFILL. LABS NEEDED. SCHEDULE APPOINTMENT.  . sodium chloride 0.9 % nebulizer solution Take 3 mLs by nebulization as needed for wheezing.  . sucralfate (CARAFATE) 1 GM/10ML suspension TAKE 10 MLS (1 G TOTAL) BY MOUTH 4 (FOUR) TIMES DAILY - WITH MEALS AND AT BEDTIME.  . theophylline (THEODUR) 300 MG 12 hr tablet TAKE 1 TABLET (300 MG TOTAL) BY MOUTH 2 (TWO) TIMES DAILY.  Marland Kitchen tiZANidine (ZANAFLEX) 4 MG tablet TAKE 1 TABLET (4 MG TOTAL) BY MOUTH 2 (TWO) TIMES DAILY AS NEEDED FOR MUSCLE SPASMS.  Marland Kitchen UNABLE TO FIND   . [DISCONTINUED] doxycycline (VIBRA-TABS) 100 MG tablet Take 1 tablet (100 mg total) by mouth 2 (two) times daily.  . [DISCONTINUED] lamoTRIgine (LAMICTAL) 25 MG tablet Take 1 tablet (25 mg total) by mouth daily. Take one tablet daily for a week and then start taking 2 tablets.  .  [DISCONTINUED] nicotine (NICODERM CQ - DOSED IN MG/24 HOURS) 21 mg/24hr patch Place 1 patch (21 mg total) onto the skin daily.  . [DISCONTINUED] promethazine (PHENERGAN) 25 MG tablet TAKE 1 TABLET EVERY 8 HOURS AS NEEDED FOR NAUSEA AND VOMITING  . [DISCONTINUED] sucralfate (CARAFATE) 1 GM/10ML suspension TAKE 10 MLS (1 G TOTAL) BY MOUTH 4 (FOUR) TIMES DAILY - WITH MEALS AND AT BEDTIME.  . [DISCONTINUED] sucralfate (CARAFATE) 1 GM/10ML suspension TAKE 10 MLS (1 G TOTAL) BY MOUTH 4 (FOUR) TIMES DAILY - WITH MEALS AND AT BEDTIME.  . mupirocin ointment (BACTROBAN) 2 % Apply to inside of each nares daily for 10 days then twice a week for maintenance.   No facility-administered encounter medications on file as of 08/17/2018.       Review of Systems: No fevers, chills, night sweats, weight loss, chest pain, or shortness of breath.   Objective:    General: Well Developed, well nourished, and in no acute distress.  Neuro: Alert and oriented x3, extra-ocular muscles intact, sensation grossly intact.  HEENT: Normocephalic, atraumatic  Skin: Warm and dry, no rashes. Cardiac: Regular rate and rhythm, no murmurs rubs or gallops, no lower extremity edema.  Respiratory: Clear to auscultation bilaterally. Not using accessory muscles, speaking in full sentences.   Impression and Recommendations:    HTN - Well controlled. Continue current regimen. Follow up in  6months.    Chronic sinus sx -encouraged her to get the CT scan done as soon as possible.  At this point she is been on multiple rounds of antibiotics for the last  4 months and really has not gotten better.  We discussed the next step of therapy is further evaluation and not another round of antibiotics which can increase her risk for other side effects such as C. difficile etc.  I have placed referral to ENT.  Patient will prefer [facility] or [place].  DM - Well controlled. Continue current regimen. Follow up in 4 months.  Foot exam performed  today.  Flu vaccine given today.  Does need to schedule a follow-up with her eye doctor.  Nasal ulcer-we will treat with mupirocin ointment.  New prescription sent to the pharmacy.  Abdominal pain with nausea and intermittent vomiting-likely secondary to her increased stress levels.  This is happened to her before and she is had very similar symptoms.  She needs a refill on her Carafate and promethazine.  Also taking her pantoprazole.  Last colonoscopy and endoscopy was in 2015.  Moderate depressive disorder/bipolar disorder-we will place new referral for behavioral health per patient's request.  She is not sleeping well and her stress has really triggered her insomnia.  She would like to restart her anxiety medication to help with this.  I am very hesitant to give her benzodiazepines because of her obesity and sleep issues and severe breathing problems.   Insomnia-clearly straight triggered by her stress levels.  She is not sure that the doxepin is really helping some to have her stop it for a couple nights and see if it makes a difference.  If not then we will discontinue it off her medication list.  Spent 40 minutes, greater than 50% time spent face-to-face counseling about blood pressure, chronic sinus symptoms, diabetes, nasal ulcers, abdominal pain with nausea and vomiting, and moderate depressive disorder.

## 2018-08-19 ENCOUNTER — Ambulatory Visit (HOSPITAL_COMMUNITY): Payer: Self-pay | Admitting: Psychiatry

## 2018-08-21 ENCOUNTER — Other Ambulatory Visit (HOSPITAL_COMMUNITY): Payer: Self-pay | Admitting: Psychiatry

## 2018-08-24 ENCOUNTER — Other Ambulatory Visit: Payer: Self-pay | Admitting: Family Medicine

## 2018-08-24 DIAGNOSIS — F99 Mental disorder, not otherwise specified: Principal | ICD-10-CM

## 2018-08-24 DIAGNOSIS — F5105 Insomnia due to other mental disorder: Secondary | ICD-10-CM

## 2018-08-25 NOTE — Telephone Encounter (Signed)
Routed to pcp for  Review and signature.Laureen Ochs, Viann Shove, CMA

## 2018-08-26 ENCOUNTER — Ambulatory Visit (HOSPITAL_COMMUNITY): Payer: Self-pay | Admitting: Licensed Clinical Social Worker

## 2018-08-26 ENCOUNTER — Other Ambulatory Visit: Payer: Self-pay | Admitting: Family Medicine

## 2018-08-28 DIAGNOSIS — J449 Chronic obstructive pulmonary disease, unspecified: Secondary | ICD-10-CM | POA: Diagnosis not present

## 2018-09-02 ENCOUNTER — Other Ambulatory Visit: Payer: Self-pay | Admitting: *Deleted

## 2018-09-02 MED ORDER — AMLODIPINE BESYLATE 5 MG PO TABS: 5.0000 mg | ORAL_TABLET | Freq: Every day | ORAL | 2 refills | Status: DC

## 2018-09-04 ENCOUNTER — Other Ambulatory Visit: Payer: Self-pay | Admitting: Family Medicine

## 2018-09-06 ENCOUNTER — Ambulatory Visit (INDEPENDENT_AMBULATORY_CARE_PROVIDER_SITE_OTHER): Payer: Medicare HMO

## 2018-09-06 ENCOUNTER — Other Ambulatory Visit: Payer: Self-pay | Admitting: Physician Assistant

## 2018-09-06 DIAGNOSIS — E099 Drug or chemical induced diabetes mellitus without complications: Secondary | ICD-10-CM | POA: Diagnosis not present

## 2018-09-06 DIAGNOSIS — I10 Essential (primary) hypertension: Secondary | ICD-10-CM | POA: Diagnosis not present

## 2018-09-06 DIAGNOSIS — M5116 Intervertebral disc disorders with radiculopathy, lumbar region: Secondary | ICD-10-CM

## 2018-09-06 DIAGNOSIS — M48061 Spinal stenosis, lumbar region without neurogenic claudication: Secondary | ICD-10-CM | POA: Diagnosis not present

## 2018-09-06 DIAGNOSIS — E038 Other specified hypothyroidism: Secondary | ICD-10-CM | POA: Diagnosis not present

## 2018-09-06 DIAGNOSIS — J3489 Other specified disorders of nose and nasal sinuses: Secondary | ICD-10-CM | POA: Diagnosis not present

## 2018-09-06 DIAGNOSIS — M545 Low back pain: Secondary | ICD-10-CM | POA: Diagnosis not present

## 2018-09-06 DIAGNOSIS — J441 Chronic obstructive pulmonary disease with (acute) exacerbation: Secondary | ICD-10-CM

## 2018-09-06 DIAGNOSIS — R51 Headache: Secondary | ICD-10-CM

## 2018-09-06 DIAGNOSIS — T380X5A Adverse effect of glucocorticoids and synthetic analogues, initial encounter: Secondary | ICD-10-CM | POA: Diagnosis not present

## 2018-09-06 DIAGNOSIS — M5417 Radiculopathy, lumbosacral region: Secondary | ICD-10-CM

## 2018-09-07 ENCOUNTER — Other Ambulatory Visit: Payer: Self-pay | Admitting: Family Medicine

## 2018-09-07 LAB — CBC WITH DIFFERENTIAL/PLATELET
BASOS ABS: 125 {cells}/uL (ref 0–200)
Basophils Relative: 1 %
Eosinophils Absolute: 450 cells/uL (ref 15–500)
Eosinophils Relative: 3.6 %
HEMATOCRIT: 38.3 % (ref 35.0–45.0)
Hemoglobin: 12.6 g/dL (ref 11.7–15.5)
LYMPHS ABS: 4775 {cells}/uL — AB (ref 850–3900)
MCH: 29.7 pg (ref 27.0–33.0)
MCHC: 32.9 g/dL (ref 32.0–36.0)
MCV: 90.3 fL (ref 80.0–100.0)
MPV: 10.4 fL (ref 7.5–12.5)
Monocytes Relative: 11.7 %
NEUTROS PCT: 45.5 %
Neutro Abs: 5688 cells/uL (ref 1500–7800)
Platelets: 413 10*3/uL — ABNORMAL HIGH (ref 140–400)
RBC: 4.24 10*6/uL (ref 3.80–5.10)
RDW: 13.8 % (ref 11.0–15.0)
Total Lymphocyte: 38.2 %
WBC: 12.5 10*3/uL — AB (ref 3.8–10.8)
WBCMIX: 1463 {cells}/uL — AB (ref 200–950)

## 2018-09-07 LAB — LIPID PANEL
CHOL/HDL RATIO: 2.2 (calc) (ref <5.0)
Cholesterol: 127 mg/dL (ref <200)
HDL: 59 mg/dL (ref >50)
LDL Cholesterol (Calc): 45 mg/dL (calc)
NON-HDL CHOLESTEROL (CALC): 68 mg/dL (ref <130)
Triglycerides: 147 mg/dL (ref <150)

## 2018-09-07 LAB — COMPLETE METABOLIC PANEL WITH GFR
AG Ratio: 1.5 (calc) (ref 1.0–2.5)
ALBUMIN MSPROF: 3.7 g/dL (ref 3.6–5.1)
ALKALINE PHOSPHATASE (APISO): 102 U/L (ref 33–130)
ALT: 9 U/L (ref 6–29)
AST: 13 U/L (ref 10–35)
BUN / CREAT RATIO: 10 (calc) (ref 6–22)
BUN: 11 mg/dL (ref 7–25)
CALCIUM: 9.5 mg/dL (ref 8.6–10.4)
CO2: 30 mmol/L (ref 20–32)
CREATININE: 1.12 mg/dL — AB (ref 0.50–1.05)
Chloride: 101 mmol/L (ref 98–110)
GFR, EST AFRICAN AMERICAN: 64 mL/min/{1.73_m2} (ref >=60)
GFR, EST NON AFRICAN AMERICAN: 55 mL/min/{1.73_m2} — AB (ref >=60)
GLOBULIN: 2.4 g/dL (ref 1.9–3.7)
Glucose, Bld: 94 mg/dL (ref 65–139)
Potassium: 4.6 mmol/L (ref 3.5–5.3)
SODIUM: 140 mmol/L (ref 135–146)
TOTAL PROTEIN: 6.1 g/dL (ref 6.1–8.1)
Total Bilirubin: 0.2 mg/dL (ref 0.2–1.2)

## 2018-09-07 LAB — TSH: TSH: 6.51 mIU/L — ABNORMAL HIGH

## 2018-09-07 MED ORDER — LEVOTHYROXINE SODIUM 88 MCG PO TABS: 88.0000 ug | ORAL_TABLET | Freq: Every day | ORAL | 1 refills | Status: DC

## 2018-09-08 ENCOUNTER — Other Ambulatory Visit: Payer: Self-pay | Admitting: Family Medicine

## 2018-09-08 NOTE — Telephone Encounter (Signed)
Pt advised, she stated that she only takes at night and that she did not request the refill. Laureen Ochs, Viann Shove, CMA

## 2018-09-08 NOTE — Telephone Encounter (Signed)
please call patient: The tizanidine says up to twice a day as needed but she really should not be taking it twice a day every single day.  It really is as needed and if because she is continuing to have that much pain and spasm then we may need to consider physical therapy or other treatments.

## 2018-09-18 ENCOUNTER — Other Ambulatory Visit: Payer: Self-pay | Admitting: Family Medicine

## 2018-09-19 ENCOUNTER — Other Ambulatory Visit: Payer: Self-pay | Admitting: Family Medicine

## 2018-09-19 DIAGNOSIS — F99 Mental disorder, not otherwise specified: Principal | ICD-10-CM

## 2018-09-19 DIAGNOSIS — F5105 Insomnia due to other mental disorder: Secondary | ICD-10-CM

## 2018-09-20 ENCOUNTER — Other Ambulatory Visit: Payer: Self-pay | Admitting: Osteopathic Medicine

## 2018-09-20 ENCOUNTER — Other Ambulatory Visit: Payer: Self-pay | Admitting: *Deleted

## 2018-09-21 ENCOUNTER — Other Ambulatory Visit: Payer: Self-pay | Admitting: Osteopathic Medicine

## 2018-09-21 NOTE — Telephone Encounter (Signed)
Patient was seen 07/21/18 at [facility]. Please review for refill.  Request: Duoneb 0.5-2.5mg /66ml sol  LOV: 07/21/18  PCP: Lyn Hollingshead  Pharmacy: verified

## 2018-09-22 NOTE — Telephone Encounter (Signed)
Med request sent in error to Dr. Lyn Hollingshead. Forwarding to provider for review.

## 2018-09-23 NOTE — Telephone Encounter (Signed)
Noted  

## 2018-09-23 NOTE — Telephone Encounter (Signed)
Already sent on 9/17

## 2018-09-27 DIAGNOSIS — J449 Chronic obstructive pulmonary disease, unspecified: Secondary | ICD-10-CM | POA: Diagnosis not present

## 2018-09-30 DIAGNOSIS — R51 Headache: Secondary | ICD-10-CM | POA: Diagnosis not present

## 2018-09-30 DIAGNOSIS — M542 Cervicalgia: Secondary | ICD-10-CM | POA: Diagnosis not present

## 2018-09-30 DIAGNOSIS — R202 Paresthesia of skin: Secondary | ICD-10-CM | POA: Diagnosis not present

## 2018-09-30 DIAGNOSIS — M5442 Lumbago with sciatica, left side: Secondary | ICD-10-CM | POA: Diagnosis not present

## 2018-09-30 DIAGNOSIS — M5441 Lumbago with sciatica, right side: Secondary | ICD-10-CM | POA: Diagnosis not present

## 2018-09-30 DIAGNOSIS — G603 Idiopathic progressive neuropathy: Secondary | ICD-10-CM | POA: Diagnosis not present

## 2018-09-30 DIAGNOSIS — G5603 Carpal tunnel syndrome, bilateral upper limbs: Secondary | ICD-10-CM | POA: Diagnosis not present

## 2018-09-30 DIAGNOSIS — Z79899 Other long term (current) drug therapy: Secondary | ICD-10-CM | POA: Diagnosis not present

## 2018-10-07 DIAGNOSIS — R69 Illness, unspecified: Secondary | ICD-10-CM | POA: Diagnosis not present

## 2018-10-23 ENCOUNTER — Other Ambulatory Visit: Payer: Self-pay | Admitting: Osteopathic Medicine

## 2018-10-25 DIAGNOSIS — R69 Illness, unspecified: Secondary | ICD-10-CM | POA: Diagnosis not present

## 2018-10-26 DIAGNOSIS — R69 Illness, unspecified: Secondary | ICD-10-CM | POA: Diagnosis not present

## 2018-10-28 ENCOUNTER — Ambulatory Visit: Payer: Self-pay | Admitting: Osteopathic Medicine

## 2018-10-28 ENCOUNTER — Other Ambulatory Visit: Payer: Self-pay | Admitting: Family Medicine

## 2018-10-28 DIAGNOSIS — J449 Chronic obstructive pulmonary disease, unspecified: Secondary | ICD-10-CM | POA: Diagnosis not present

## 2018-10-29 ENCOUNTER — Encounter: Payer: Self-pay | Admitting: Family Medicine

## 2018-10-29 ENCOUNTER — Ambulatory Visit (INDEPENDENT_AMBULATORY_CARE_PROVIDER_SITE_OTHER): Payer: Medicare HMO | Admitting: Family Medicine

## 2018-10-29 VITALS — BP 125/80 | HR 105 | Ht 62.7 in | Wt 233.0 lb

## 2018-10-29 DIAGNOSIS — F3132 Bipolar disorder, current episode depressed, moderate: Secondary | ICD-10-CM | POA: Diagnosis not present

## 2018-10-29 DIAGNOSIS — J441 Chronic obstructive pulmonary disease with (acute) exacerbation: Secondary | ICD-10-CM | POA: Diagnosis not present

## 2018-10-29 DIAGNOSIS — J019 Acute sinusitis, unspecified: Secondary | ICD-10-CM | POA: Diagnosis not present

## 2018-10-29 DIAGNOSIS — R69 Illness, unspecified: Secondary | ICD-10-CM | POA: Diagnosis not present

## 2018-10-29 MED ORDER — PREDNISONE 20 MG PO TABS: 40.0000 mg | ORAL_TABLET | Freq: Every day | ORAL | 0 refills | Status: DC

## 2018-10-29 MED ORDER — SULFAMETHOXAZOLE-TRIMETHOPRIM 800-160 MG PO TABS: 1.0000 | ORAL_TABLET | Freq: Two times a day (BID) | ORAL | 0 refills | Status: DC

## 2018-10-29 NOTE — Progress Notes (Signed)
Subjective:    Patient ID: Krystal Burgess, female    DOB: [birth year], 57 y.o.   MRN: [id number]  HPI Sinus sxs x 3 days facial pressure, wheezing, sob/exacerbation,she has had to use her nebulizer more, and taking sinus medication.+ SOB and wheezing.  Taking over-the-counter sinus and cold medication.  She starting to get some green mucus that was previously more of a thick white mucus.  She feels like her ears are draining.  And mild sore throat.  Her psych is weaning her seroquel and her cymbalta.  They are going to be switching her to Lexapro and they have also has recently started her on Xanax 0.25 mg.  COPD-she does need a refill on her DuoNeb.  She would like it written how it was written back in July for  1080 mL's so that she is not running short.   Review of Systems  BP 125/80   Pulse (!) 105   Ht 5' 2.7" (1.593 m)   Wt 233 lb (105.7 kg)   SpO2 96%   BMI 41.67 kg/m     Allergies  Allergen Reactions  . Aripiprazole Other (See Comments)    REACTION: muscle jerks   . Fluoxetine Hcl Other (See Comments)    REACTION: Intolerant  . Nsaids Other (See Comments)    Upper GI bleed   . Paroxetine Other (See Comments)    REACTION: Intolerance   . Ziprasidone Hcl Other (See Comments)    shakes   . Augmentin [Amoxicillin-Pot Clavulanate] Other (See Comments)    Gets yeast infection every time   . Fluoxetine Hcl     REACTION: Intolerant  . Metformin And Related Nausea Only  . Onglyza [Saxagliptin] Nausea Only    Past Medical History:  Diagnosis Date  . Allergy   . Hyperlipidemia   . Pain   . Thyroid disease     Past Surgical History:  Procedure Laterality Date  . ABDOMINAL HYSTERECTOMY    . CESAREAN SECTION    . CHOLECYSTECTOMY    . lipoma removal     RT shoulder  . LUMBAR SPINE SURGERY  2013   Dr. Manson Passey, L4-5  . pilonydal cyst  1994    Social History   Socioeconomic History  . Marital status: Divorced    Spouse name: Not on file  . Number of  children: Not on file  . Years of education: Not on file  . Highest education level: Not on file  Occupational History  . Not on file  Social Needs  . Financial resource strain: Not on file  . Food insecurity:    Worry: Not on file    Inability: Not on file  . Transportation needs:    Medical: Not on file    Non-medical: Not on file  Tobacco Use  . Smoking status: Current Every Day Smoker    Packs/day: 1.00  . Smokeless tobacco: Never Used  Substance and Sexual Activity  . Alcohol use: Not Currently  . Drug use: No  . Sexual activity: Not on file    Comment: s with mother currentlynemployed, on disabilty, divorced, liver  Lifestyle  . Physical activity:    Days per week: Not on file    Minutes per session: Not on file  . Stress: Not on file  Relationships  . Social connections:    Talks on phone: Not on file    Gets together: Not on file    Attends religious service: Not on file  Active member of club or organization: Not on file    Attends meetings of clubs or organizations: Not on file    Relationship status: Not on file  . Intimate partner violence:    Fear of current or ex partner: Not on file    Emotionally abused: Not on file    Physically abused: Not on file    Forced sexual activity: Not on file  Other Topics Concern  . Not on file  Social History Narrative  . Not on file    Family History  Problem Relation Age of Onset  . Diabetes Brother   . Depression Brother        bipolar  . Cancer Brother        throat  . Diabetes Unknown        grandmother  . Depression Mother   . Hyperlipidemia Mother   . Hypertension Mother   . Pancreatic cancer Mother   . Hypertension Father     Outpatient Encounter Medications as of 10/29/2018  Medication Sig  . albuterol (VENTOLIN HFA) 108 (90 Base) MCG/ACT inhaler Inhale 2 puffs into the lungs every 4 (four) hours as needed for wheezing or shortness of breath.  . ALPRAZolam (XANAX) 0.25 MG tablet Take 0.25 mg by  mouth.  . AMBULATORY NON FORMULARY MEDICATION Medication Name: ONE TOUCH METER. CHECK BLOOD SUGAR TWICE DAILY. DX:E11.9  . amLODipine (NORVASC) 5 MG tablet Take 1 tablet (5 mg total) by mouth daily.  . brimonidine-timolol (COMBIGAN) 0.2-0.5 % ophthalmic solution Place 2 drops into both eyes daily.  . busPIRone (BUSPAR) 10 MG tablet Take 10 mg by mouth 3 (three) times daily.  . diclofenac sodium (VOLTAREN) 1 % GEL APPLE 4G TO AFFECTED AREA UP TO THREE TIMES A DAY  . doxepin (SINEQUAN) 10 MG capsule TAKE 1 CAPSULE BY MOUTH EVERYDAY AT BEDTIME  . fluticasone (FLONASE) 50 MCG/ACT nasal spray Place 2 sprays into both nostrils daily.  . furosemide (LASIX) 20 MG tablet TAKE 1 TABLET BY MOUTH EVERY DAY  . gabapentin (NEURONTIN) 600 MG tablet Take 600 mg by mouth 3 (three) times daily.  Marland Kitchen glucose blood test strip CHECK BLOOD SUGAR TWICE DAILY. DX: E11.9  . ipratropium (ATROVENT) 0.06 % nasal spray PLACE 2 SPRAYS INTO BOTH NOSTRILS 4 (FOUR) TIMES DAILY.  Marland Kitchen ipratropium-albuterol (DUONEB) 0.5-2.5 (3) MG/3ML SOLN TAKE 3 MLS BY NEBULIZATION EVERY 2 (TWO) HOURS WHILE AWAKE.  Marland Kitchen JANUVIA 100 MG tablet TAKE 1 TABLET EVERY DAY  . levocetirizine (XYZAL) 5 MG tablet Take 1 tablet (5 mg total) by mouth every evening.  Marland Kitchen levothyroxine (SYNTHROID, LEVOTHROID) 88 MCG tablet Take 1 tablet (88 mcg total) by mouth daily.  Marland Kitchen lidocaine (LIDODERM) 5 % APPLY UP TO 3 PATCHES TO AFFECTED AREA DAILY PRN FOR 30 DAYS  . losartan (COZAAR) 100 MG tablet TAKE 1 TABLET EVERY DAY -NEEDS APPT. FOR MORE REFILLS  . mupirocin ointment (BACTROBAN) 2 % Apply to inside of each nares daily for 10 days then twice a week for maintenance.  . naproxen (NAPROSYN) 500 MG tablet TAKE 1 TABLET (500 MG TOTAL) BY MOUTH 2 (TWO) TIMES DAILY WITH A MEAL.  Marland Kitchen nystatin (MYCOSTATIN) 100000 UNIT/ML suspension TAKE 1 TEASPOONFUL 4 TIMES A DAY  . oxyCODONE-acetaminophen (PERCOCET) 10-325 MG tablet Take 1 tablet by mouth every 6 (six) hours as needed.  . OXYGEN  Inhale 4 L into the lungs.  . pantoprazole (PROTONIX) 40 MG tablet Take 1 tablet (40 mg total) by mouth 2 (two) times daily.  Marland Kitchen  promethazine (PHENERGAN) 25 MG tablet TAKE 1 TABLET EVERY 8 HOURS AS NEEDED FOR NAUSEA AND VOMITING  . rosuvastatin (CRESTOR) 20 MG tablet TAKE 1 TABLET EVERY DAY  . sodium chloride 0.9 % nebulizer solution Take 3 mLs by nebulization as needed for wheezing.  . sucralfate (CARAFATE) 1 GM/10ML suspension TAKE 10 MLS (1 G TOTAL) BY MOUTH 4 (FOUR) TIMES DAILY - WITH MEALS AND AT BEDTIME.  . theophylline (THEODUR) 300 MG 12 hr tablet TAKE 1 TABLET (300 MG TOTAL) BY MOUTH 2 (TWO) TIMES DAILY.  . [DISCONTINUED] tiZANidine (ZANAFLEX) 4 MG tablet TAKE 1 TABLET BY MOUTH 2 (TWO) TIMES DAILY AS NEEDED FOR MUSCLE SPASMS.  Marland Kitchen predniSONE (DELTASONE) 20 MG tablet Take 2 tablets (40 mg total) by mouth daily with breakfast.  . sulfamethoxazole-trimethoprim (BACTRIM DS,SEPTRA DS) 800-160 MG tablet Take 1 tablet by mouth 2 (two) times daily.  . [DISCONTINUED] DULoxetine (CYMBALTA) 30 MG capsule TAKE 1 CAPSULE BY MOUTH EVERY DAY  . [DISCONTINUED] DULoxetine (CYMBALTA) 60 MG capsule TAKE 1 CAPSULE BY MOUTH EVERY DAY  . [DISCONTINUED] lamoTRIgine (LAMICTAL) 25 MG tablet TAKE 1 TABLET EVERY DAY FOR 1 WEEK THEN TAKE 2 TABLETS EVERY DAY  . [DISCONTINUED] oxyCODONE-acetaminophen (PERCOCET) 7.5-325 MG tablet TAKE 1 TABLET BY MOUTH EVERY 6 HOURS AS NEEDED FOR 30 DAYS  . [DISCONTINUED] QUEtiapine (SEROQUEL) 300 MG tablet TAKE 1 TABLET (300 MG TOTAL) BY MOUTH 2 (TWO) TIMES DAILY.  . [DISCONTINUED] UNABLE TO FIND    No facility-administered encounter medications on file as of 10/29/2018.           Objective:   Physical Exam  Constitutional: She is oriented to person, place, and time. She appears well-developed and well-nourished.  HENT:  Head: Normocephalic and atraumatic.  Right Ear: External ear normal.  Left Ear: External ear normal.  Nose: Nose normal.  Mouth/Throat: Oropharynx is clear  and moist.  TMs and canals are clear.   Eyes: Pupils are equal, round, and reactive to light. Conjunctivae and EOM are normal.  Neck: Neck supple. No thyromegaly present.  Cardiovascular: Normal rate, regular rhythm and normal heart sounds.  Pulmonary/Chest: Effort normal and breath sounds normal. She has no wheezes.  Lymphadenopathy:    She has no cervical adenopathy.  Neurological: She is alert and oriented to person, place, and time.  Skin: Skin is warm and dry.  Psychiatric: She has a normal mood and affect.       Assessment & Plan:  Acute sinusitis-we will treat with Bactrim DS.  Try to alternate antibiotics with her because she has been on multiple rounds in the last year.  She cannot take penicillin.  If she is not better after the weekend please give [place] a call back.  Right now I am not hearing a lot of inflammation in her lungs but I am getting give her some prednisone just in case over the weekend this really starts to hit her COPD.  COPD exacerbation - just filled her Duoneb 4 days ago. She is doing well with her weight loss and I feel that has really helped her breathing.    Bipolar disorder-psychiatry is currently adjusting her medications.

## 2018-11-02 ENCOUNTER — Other Ambulatory Visit: Payer: Self-pay | Admitting: Family Medicine

## 2018-11-08 ENCOUNTER — Other Ambulatory Visit: Payer: Self-pay | Admitting: Osteopathic Medicine

## 2018-11-15 ENCOUNTER — Other Ambulatory Visit: Payer: Self-pay | Admitting: *Deleted

## 2018-11-15 MED ORDER — FLUTICASONE PROPIONATE 50 MCG/ACT NA SUSP: 2.0000 | Freq: Every day | NASAL | 3 refills | Status: DC

## 2018-11-16 ENCOUNTER — Ambulatory Visit: Payer: Self-pay | Admitting: Family Medicine

## 2018-11-17 DIAGNOSIS — R69 Illness, unspecified: Secondary | ICD-10-CM | POA: Diagnosis not present

## 2018-11-23 DIAGNOSIS — R69 Illness, unspecified: Secondary | ICD-10-CM | POA: Diagnosis not present

## 2018-11-24 ENCOUNTER — Other Ambulatory Visit: Payer: Self-pay | Admitting: Family Medicine

## 2018-11-24 ENCOUNTER — Other Ambulatory Visit: Payer: Self-pay | Admitting: *Deleted

## 2018-11-27 DIAGNOSIS — J449 Chronic obstructive pulmonary disease, unspecified: Secondary | ICD-10-CM | POA: Diagnosis not present

## 2018-11-29 ENCOUNTER — Other Ambulatory Visit: Payer: Self-pay | Admitting: Family Medicine

## 2018-12-13 DIAGNOSIS — R69 Illness, unspecified: Secondary | ICD-10-CM | POA: Diagnosis not present

## 2018-12-16 ENCOUNTER — Other Ambulatory Visit: Payer: Self-pay | Admitting: Family Medicine

## 2018-12-16 DIAGNOSIS — R69 Illness, unspecified: Secondary | ICD-10-CM | POA: Diagnosis not present

## 2018-12-28 DIAGNOSIS — J449 Chronic obstructive pulmonary disease, unspecified: Secondary | ICD-10-CM | POA: Diagnosis not present

## 2018-12-29 ENCOUNTER — Ambulatory Visit (INDEPENDENT_AMBULATORY_CARE_PROVIDER_SITE_OTHER): Payer: Medicare HMO | Admitting: Osteopathic Medicine

## 2018-12-29 VITALS — BP 136/67 | HR 85 | Wt 243.0 lb

## 2018-12-29 DIAGNOSIS — J441 Chronic obstructive pulmonary disease with (acute) exacerbation: Secondary | ICD-10-CM | POA: Diagnosis not present

## 2018-12-29 DIAGNOSIS — F17200 Nicotine dependence, unspecified, uncomplicated: Secondary | ICD-10-CM | POA: Diagnosis not present

## 2018-12-29 DIAGNOSIS — R69 Illness, unspecified: Secondary | ICD-10-CM | POA: Diagnosis not present

## 2018-12-29 MED ORDER — IPRATROPIUM BROMIDE 0.06 % NA SOLN: 2.0000 | Freq: Four times a day (QID) | NASAL | 4 refills | Status: DC

## 2018-12-29 MED ORDER — PREDNISONE 20 MG PO TABS: 20.0000 mg | ORAL_TABLET | Freq: Two times a day (BID) | ORAL | 0 refills | Status: DC

## 2018-12-29 NOTE — Progress Notes (Signed)
HPI: Krystal Burgess is a 58 y.o. female who  has a past medical history of Allergy, Hyperlipidemia, Pain, and Thyroid disease.  she presents to [facility] today, 12/29/18,  for chief complaint of: Sick, congestion  . Context: chronic sinus issues and on home O2,  . Location/Quality: Sinus congestion, reports trouble breathing through her nose.  . Duration: 3 days . Modifying factors: using nebulizer more often.  . Assoc signs/symptoms: abdominal "swelling", was reading online and worried about her liver. Reports few days ago sharp pain in upper abdomen when leaning over to pick something up off the floor, no change in bowel movements, no N/V, no fever. S/p chole years ago.   Patient is accompanied by daughter who assists with history-taking.       Past medical, surgical, social and family history reviewed and updated as necessary.   Current medication list and allergy/intolerance information reviewed:    Current Outpatient Medications  Medication Sig Dispense Refill  . albuterol (VENTOLIN HFA) 108 (90 Base) MCG/ACT inhaler Inhale 2 puffs into the lungs every 4 (four) hours as needed for wheezing or shortness of breath. 18 g 11  . ALPRAZolam (XANAX) 0.25 MG tablet Take 0.25 mg by mouth.    . AMBULATORY NON FORMULARY MEDICATION Medication Name: ONE TOUCH METER. CHECK BLOOD SUGAR TWICE DAILY. DX:E11.9 1 each 0  . amLODipine (NORVASC) 5 MG tablet Take 1 tablet (5 mg total) by mouth daily. 90 tablet 2  . brimonidine-timolol (COMBIGAN) 0.2-0.5 % ophthalmic solution Place 2 drops into both eyes daily.    . busPIRone (BUSPAR) 10 MG tablet Take 10 mg by mouth 3 (three) times daily.  2  . diclofenac sodium (VOLTAREN) 1 % GEL APPLE 4G TO AFFECTED AREA UP TO THREE TIMES A DAY  3  . doxepin (SINEQUAN) 10 MG capsule TAKE 1 CAPSULE BY MOUTH EVERYDAY AT BEDTIME 30 capsule 5  . fluticasone (FLONASE) 50 MCG/ACT nasal spray Place 2 sprays into both nostrils daily.  16 g 3  . furosemide (LASIX) 20 MG tablet TAKE 1 TABLET BY MOUTH EVERY DAY 30 tablet 0  . gabapentin (NEURONTIN) 600 MG tablet Take 600 mg by mouth 3 (three) times daily.  6  . glucose blood test strip CHECK BLOOD SUGAR TWICE DAILY. DX: E11.9 100 each 12  . ipratropium (ATROVENT) 0.06 % nasal spray PLACE 2 SPRAYS INTO BOTH NOSTRILS 4 (FOUR) TIMES DAILY. 45 mL 4  . ipratropium-albuterol (DUONEB) 0.5-2.5 (3) MG/3ML SOLN USE 1 VIAL IN NEBULIZER EVERY 2 TO 3 HOURS 540 mL 11  . JANUVIA 100 MG tablet TAKE 1 TABLET BY MOUTH EVERY DAY 90 tablet 1  . levocetirizine (XYZAL) 5 MG tablet Take 1 tablet (5 mg total) by mouth every evening. 90 tablet 1  . levothyroxine (SYNTHROID, LEVOTHROID) 88 MCG tablet Take 1 tablet (88 mcg total) by mouth daily. 90 tablet 1  . lidocaine (LIDODERM) 5 % APPLY UP TO 3 PATCHES TO AFFECTED AREA DAILY PRN FOR 30 DAYS  3  . losartan (COZAAR) 100 MG tablet TAKE 1 TABLET EVERY DAY -NEEDS APPT. FOR MORE REFILLS 90 tablet 1  . mupirocin ointment (BACTROBAN) 2 % Apply to inside of each nares daily for 10 days then twice a week for maintenance. 30 g 0  . naproxen (NAPROSYN) 500 MG tablet TAKE 1 TABLET (500 MG TOTAL) BY MOUTH 2 (TWO) TIMES DAILY WITH A MEAL. 180 tablet 1  . nystatin (MYCOSTATIN) 100000 UNIT/ML suspension TAKE 1 TEASPOONFUL 4 TIMES  A DAY 120 mL 0  . oxyCODONE-acetaminophen (PERCOCET) 10-325 MG tablet Take 1 tablet by mouth every 6 (six) hours as needed.  0  . OXYGEN Inhale 4 L into the lungs.    . pantoprazole (PROTONIX) 40 MG tablet Take 1 tablet (40 mg total) by mouth 2 (two) times daily. 180 tablet 1  . promethazine (PHENERGAN) 25 MG tablet TAKE 1 TABLET BY MOUTH EVERY 8 HOURS AS NEEDED FOR NAUSEA AND VOMITING 25 tablet 2  . QUEtiapine (SEROQUEL) 200 MG tablet Take 200 mg by mouth at bedtime.  2  . rosuvastatin (CRESTOR) 20 MG tablet TAKE 1 TABLET EVERY DAY 90 tablet 3  . sodium chloride 0.9 % nebulizer solution Take 3 mLs by nebulization as needed for wheezing. 90 mL  12  . sucralfate (CARAFATE) 1 GM/10ML suspension TAKE 10 MLS (1 G TOTAL) BY MOUTH 4 (FOUR) TIMES DAILY - WITH MEALS AND AT BEDTIME. 420 mL 3  . theophylline (THEODUR) 300 MG 12 hr tablet TAKE 1 TABLET (300 MG TOTAL) BY MOUTH 2 (TWO) TIMES DAILY. 180 tablet 1  . tiZANidine (ZANAFLEX) 4 MG tablet TAKE 1 TABLET BY MOUTH 2 (TWO) TIMES DAILY AS NEEDED FOR MUSCLE SPASMS. 30 tablet 0  . TRELEGY ELLIPTA 100-62.5-25 MCG/INH AEPB INHALE 1 PUFF DAILY INTO THE LUNGS. 60 each 11   No current facility-administered medications for this visit.     Allergies  Allergen Reactions  . Aripiprazole Other (See Comments)    REACTION: muscle jerks   . Fluoxetine Hcl Other (See Comments)    REACTION: Intolerant  . Nsaids Other (See Comments)    Upper GI bleed   . Paroxetine Other (See Comments)    REACTION: Intolerance   . Ziprasidone Hcl Other (See Comments)    shakes   . Augmentin [Amoxicillin-Pot Clavulanate] Other (See Comments)    Gets yeast infection every time   . Fluoxetine Hcl     REACTION: Intolerant  . Metformin And Related Nausea Only  . Onglyza [Saxagliptin] Nausea Only      Review of Systems:  Constitutional:  No  fever, no chills, +recent illness, No unintentional weight changes. No significant fatigue.   HEENT: No  headache, no vision change, no hearing change, No sore throat, + sinus pressure  Cardiac: No  chest pain, No  pressure, No palpitations  Respiratory:  No  shortness of breath. +Cough  Gastrointestinal: No  abdominal pain, No  nausea, No  vomiting,  No  blood in stool, No  diarrhea  Musculoskeletal: No new myalgia/arthralgia  Skin: No  Rash  Neurologic: No  weakness, No  dizziness  Exam:  BP 136/67 (BP Location: Left Arm, Patient Position: Sitting, Cuff Size: Normal)   Pulse 85   Wt 243 lb (110.2 kg)   BMI 43.46 kg/m   Constitutional: VS see above. General Appearance: alert, well-developed, well-nourished, NAD  Eyes: Normal lids and conjunctive,  non-icteric sclera  Ears, Nose, Mouth, Throat: MMM, Normal external inspection ears/nares/mouth/lips/gums. TM normal bilaterally. Pharynx/tonsils no erythema, no exudate. Nasal mucosa normal.   Neck: No masses, trachea midline. No tenderness/mass appreciated. No lymphadenopathy  Respiratory: Normal respiratory effort. no wheeze, no rhonchi, no rales, very diminished breath sounds but lungs are clear   Cardiovascular: S1/S2 normal, no murmur, no rub/gallop auscultated. RRR. No lower extremity edema.   Gastrointestinal: (+)TTP upper abdomen diffuse, no rebound, no distension, habitus limits exam but no obvious masses. Bowel sounds normal.  Musculoskeletal: Gait normal.   Neurological: Normal balance/coordination. No tremor.  Skin: warm, dry, intact.   Psychiatric: Normal judgment/insight. Normal mood and affect.      ASSESSMENT/PLAN: The primary encounter diagnosis was COPD with acute exacerbation (HCC). Diagnoses of SMOKER and COPD exacerbation (HCC) were also pertinent to this visit.   Abd pain, s/p cholecystectomy long ago, reports some sharp pain that comes and goes, declined US or labs today, seems more likely scar tissue in absence of other concerning symptoms such as fever, diarrhea, other stool abnormality. To monitor.   COPD exacerbation, pt declined CXR. Likely d/t viral illness.   Meds ordered this encounter  Medications  . predniSONE (DELTASONE) 20 MG tablet    Sig: Take 1 tablet (20 mg total) by mouth 2 (two) times daily with a meal.    Dispense:  10 tablet    Refill:  0  . ipratropium (ATROVENT) 0.06 % nasal spray    Sig: Place 2 sprays into both nostrils 4 (four) times daily.    Dispense:  45 mL    Refill:  4       Visit summary with medication list and pertinent instructions was printed for patient to review. All questions at time of visit were answered - patient instructed to contact office with any additional concerns or updates. ER/RTC precautions were  reviewed with the patient.   Follow-up plan: Return if symptoms worsen or fail to improve.    Please note: voice recognition software was used to produce this document, and typos may escape review. Please contact Dr. Lyn HollingsheadAlexander for any needed clarifications.

## 2019-01-03 ENCOUNTER — Telehealth: Payer: Self-pay

## 2019-01-03 ENCOUNTER — Other Ambulatory Visit: Payer: Self-pay | Admitting: *Deleted

## 2019-01-03 MED ORDER — LOSARTAN POTASSIUM 100 MG PO TABS: 100.0000 mg | ORAL_TABLET | Freq: Every day | ORAL | 1 refills | Status: DC

## 2019-01-03 NOTE — Telephone Encounter (Signed)
PT Needs refill on Lasartan. She has called the pharmacy they are telling her she needs to call her doctors office. Verified pharmacy in chart is correct. Also notified patient that DMV Placard is ready to pick up, pt asked that it be mailed. Verified address in system is correct.

## 2019-01-03 NOTE — Telephone Encounter (Signed)
Medication sent to pharmacy..Tommye Lehenbauer Lynetta, CMA  

## 2019-01-07 ENCOUNTER — Other Ambulatory Visit: Payer: Self-pay | Admitting: Family Medicine

## 2019-01-10 ENCOUNTER — Other Ambulatory Visit: Payer: Self-pay | Admitting: Family Medicine

## 2019-01-10 DIAGNOSIS — E119 Type 2 diabetes mellitus without complications: Secondary | ICD-10-CM

## 2019-01-10 DIAGNOSIS — R69 Illness, unspecified: Secondary | ICD-10-CM | POA: Diagnosis not present

## 2019-01-14 ENCOUNTER — Ambulatory Visit (INDEPENDENT_AMBULATORY_CARE_PROVIDER_SITE_OTHER): Payer: Medicare HMO | Admitting: Family Medicine

## 2019-01-14 ENCOUNTER — Encounter: Payer: Self-pay | Admitting: Family Medicine

## 2019-01-14 VITALS — BP 110/64 | HR 117 | Ht 63.0 in | Wt 244.0 lb

## 2019-01-14 DIAGNOSIS — G2581 Restless legs syndrome: Secondary | ICD-10-CM | POA: Insufficient documentation

## 2019-01-14 DIAGNOSIS — Z720 Tobacco use: Secondary | ICD-10-CM | POA: Diagnosis not present

## 2019-01-14 DIAGNOSIS — J019 Acute sinusitis, unspecified: Secondary | ICD-10-CM

## 2019-01-14 MED ORDER — LEVOCETIRIZINE DIHYDROCHLORIDE 5 MG PO TABS: 5.0000 mg | ORAL_TABLET | Freq: Every evening | ORAL | 1 refills | Status: DC

## 2019-01-14 MED ORDER — GUAIFENESIN ER 600 MG PO TB12: 600.0000 mg | ORAL_TABLET | Freq: Two times a day (BID) | ORAL | 99 refills | Status: DC | PRN

## 2019-01-14 MED ORDER — AZITHROMYCIN 250 MG PO TABS: ORAL_TABLET | ORAL | 0 refills | Status: AC

## 2019-01-14 MED ORDER — ROPINIROLE HCL 0.25 MG PO TABS: 0.2500 mg | ORAL_TABLET | Freq: Every day | ORAL | 2 refills | Status: DC

## 2019-01-14 MED ORDER — VARENICLINE TARTRATE 0.5 MG X 11 & 1 MG X 42 PO MISC: ORAL | 0 refills | Status: DC

## 2019-01-14 MED ORDER — MONTELUKAST SODIUM 10 MG PO TABS: 10.0000 mg | ORAL_TABLET | Freq: Every day | ORAL | 11 refills | Status: DC

## 2019-01-14 MED ORDER — SODIUM CHLORIDE 0.9 % IN NEBU: 3.0000 mL | INHALATION_SOLUTION | RESPIRATORY_TRACT | 12 refills | Status: DC | PRN

## 2019-01-14 NOTE — Progress Notes (Signed)
Acute Office Visit  Subjective:    Patient ID: Krystal Burgess, female    DOB: 1961/06/10, 58 y.o.   MRN: [id number]  Chief Complaint  Patient presents with  . Sinusitis    HPI  Patient is in today for cough and congestion. She was seen on 1/8 by one of my partners for similar symptom and was dx w/ COPD exacerbation. She was given prednisone.  No fever, sweats, or chills.  Does like she has just lots of phlegm and congestion in her upper chest and in her nose and just really cannot get it out.  She says she really cannot afford to get the over-the-counter Mucinex and wonders if it could be prescribed.  She does feel like allergies are aggravating her symptoms.  She is now living with her daughter who has 2 cats and a dog and she does have allergies to cats.  She would like to restart her allergy medication he was previously on Xyzal and years ago she actually took Singulair.  She is having some pain in her right ear.  And is starting to get some yellow thick colored congestion from her nasal passages.  She also wanted to discuss her gabapentin.  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  She takes 1200 mg at bedtime and that does help her sleep.  She wants to know if we can switch her to something else that she read online that the combination of gabapentin and oxycodone can depress her breathing.  Plus she takes a benzodiazepine and Latuda.  All of which are sedating.  She is stated in restarting Chantix to quit smoking.  Past Medical History:  Diagnosis Date  . Allergy   . Hyperlipidemia   . Pain   . Thyroid disease     Past Surgical History:  Procedure Laterality Date  . ABDOMINAL HYSTERECTOMY    . CESAREAN SECTION    . CHOLECYSTECTOMY    . lipoma removal     RT shoulder  . LUMBAR SPINE SURGERY  2013   Dr. Manson PasseyBrown, L4-5  . pilonydal cyst  1994    Family History  Problem Relation Age of Onset  .  Diabetes Brother   . Depression Brother        bipolar  . Cancer Brother        throat  . Diabetes Unknown        grandmother  . Depression Mother   . Hyperlipidemia Mother   . Hypertension Mother   . Pancreatic cancer Mother   . Hypertension Father     Social History   Socioeconomic History  . Marital status: Divorced    Spouse name: Not on file  . Number of children: Not on file  . Years of education: Not on file  . Highest education level: Not on file  Occupational History  . Not on file  Social Needs  . Financial resource strain: Not on file  . Food insecurity:    Worry: Not on file    Inability: Not on file  . Transportation needs:    Medical: Not on file    Non-medical: Not on file  Tobacco Use  . Smoking status: Current Every Day Smoker    Packs/day: 1.00  . Smokeless tobacco: Never Used  Substance and Sexual Activity  . Alcohol use: Not Currently  . Drug use: No  . Sexual activity: Not  on file    Comment: s with mother currentlynemployed, on disabilty, divorced, liver  Lifestyle  . Physical activity:    Days per week: Not on file    Minutes per session: Not on file  . Stress: Not on file  Relationships  . Social connections:    Talks on phone: Not on file    Gets together: Not on file    Attends religious service: Not on file    Active member of club or organization: Not on file    Attends meetings of clubs or organizations: Not on file    Relationship status: Not on file  . Intimate partner violence:    Fear of current or ex partner: Not on file    Emotionally abused: Not on file    Physically abused: Not on file    Forced sexual activity: Not on file  Other Topics Concern  . Not on file  Social History Narrative  . Not on file    Outpatient Medications Prior to Visit  Medication Sig Dispense Refill  . albuterol (VENTOLIN HFA) 108 (90 Base) MCG/ACT inhaler Inhale 2 puffs into the lungs every 4 (four) hours as needed for wheezing or shortness  of breath. 18 g 11  . ALPRAZolam (XANAX) 1 MG tablet Take 1 mg by mouth at bedtime as needed.    . AMBULATORY NON FORMULARY MEDICATION Medication Name: ONE TOUCH METER. CHECK BLOOD SUGAR TWICE DAILY. DX:E11.9 1 each 0  . amLODipine (NORVASC) 5 MG tablet Take 1 tablet (5 mg total) by mouth daily. 90 tablet 2  . brimonidine-timolol (COMBIGAN) 0.2-0.5 % ophthalmic solution Place 2 drops into both eyes daily.    . diclofenac sodium (VOLTAREN) 1 % GEL APPLE 4G TO AFFECTED AREA UP TO THREE TIMES A DAY  3  . doxepin (SINEQUAN) 10 MG capsule TAKE 1 CAPSULE BY MOUTH EVERYDAY AT BEDTIME 30 capsule 5  . fluticasone (FLONASE) 50 MCG/ACT nasal spray Place 2 sprays into both nostrils daily. 16 g 3  . furosemide (LASIX) 20 MG tablet TAKE 1 TABLET BY MOUTH EVERY DAY 30 tablet 0  . gabapentin (NEURONTIN) 600 MG tablet Take 600 mg by mouth 3 (three) times daily.  6  . glucose blood test strip TEST TWICE A DAY. DX: E11.9 200 each 11  . ipratropium (ATROVENT) 0.06 % nasal spray Place 2 sprays into both nostrils 4 (four) times daily. 45 mL 4  . ipratropium-albuterol (DUONEB) 0.5-2.5 (3) MG/3ML SOLN USE 1 VIAL IN NEBULIZER EVERY 2 TO 3 HOURS 540 mL 11  . JANUVIA 100 MG tablet TAKE 1 TABLET BY MOUTH EVERY DAY 90 tablet 1  . levothyroxine (SYNTHROID, LEVOTHROID) 88 MCG tablet Take 1 tablet (88 mcg total) by mouth daily. 90 tablet 1  . lidocaine (LIDODERM) 5 % APPLY UP TO 3 PATCHES TO AFFECTED AREA DAILY PRN FOR 30 DAYS  3  . losartan (COZAAR) 100 MG tablet Take 1 tablet (100 mg total) by mouth daily. 90 tablet 1  . lurasidone (LATUDA) 40 MG TABS tablet Take by mouth.     . mupirocin ointment (BACTROBAN) 2 % Apply to inside of each nares daily for 10 days then twice a week for maintenance. 30 g 0  . naproxen (NAPROSYN) 500 MG tablet TAKE 1 TABLET (500 MG TOTAL) BY MOUTH 2 (TWO) TIMES DAILY WITH A MEAL. 180 tablet 1  . ONETOUCH DELICA LANCETS 33G MISC USE TWICE DAILY. DX: E11.9 200 each 11  . oxyCODONE-acetaminophen  (PERCOCET) 10-325 MG tablet  Take 1 tablet by mouth every 6 (six) hours as needed.  0  . OXYGEN Inhale 4 L into the lungs.    . pantoprazole (PROTONIX) 40 MG tablet Take 1 tablet (40 mg total) by mouth 2 (two) times daily. 180 tablet 1  . promethazine (PHENERGAN) 25 MG tablet TAKE 1 TABLET BY MOUTH EVERY 8 HOURS AS NEEDED FOR NAUSEA AND VOMITING 25 tablet 2  . rosuvastatin (CRESTOR) 20 MG tablet TAKE 1 TABLET EVERY DAY 90 tablet 3  . sucralfate (CARAFATE) 1 GM/10ML suspension TAKE 10 MLS (1 G TOTAL) BY MOUTH 4 (FOUR) TIMES DAILY - WITH MEALS AND AT BEDTIME. 420 mL 3  . theophylline (THEODUR) 300 MG 12 hr tablet TAKE 1 TABLET (300 MG TOTAL) BY MOUTH 2 (TWO) TIMES DAILY. 180 tablet 1  . tiZANidine (ZANAFLEX) 4 MG tablet TAKE 1 TABLET BY MOUTH 2 (TWO) TIMES DAILY AS NEEDED FOR MUSCLE SPASMS. 30 tablet 0  . TRELEGY ELLIPTA 100-62.5-25 MCG/INH AEPB INHALE 1 PUFF DAILY INTO THE LUNGS. 60 each 11  . levocetirizine (XYZAL) 5 MG tablet Take 1 tablet (5 mg total) by mouth every evening. 90 tablet 1  . nystatin (MYCOSTATIN) 100000 UNIT/ML suspension TAKE 1 TEASPOONFUL 4 TIMES A DAY 120 mL 0  . sodium chloride 0.9 % nebulizer solution Take 3 mLs by nebulization as needed for wheezing. 90 mL 12  . ALPRAZolam (XANAX) 0.25 MG tablet Take 0.25 mg by mouth.    . busPIRone (BUSPAR) 10 MG tablet Take 10 mg by mouth 3 (three) times daily.  2  . predniSONE (DELTASONE) 20 MG tablet Take 1 tablet (20 mg total) by mouth 2 (two) times daily with a meal. 10 tablet 0  . QUEtiapine (SEROQUEL) 200 MG tablet Take 200 mg by mouth at bedtime.  2   No facility-administered medications prior to visit.     Allergies  Allergen Reactions  . Aripiprazole Other (See Comments)    REACTION: muscle jerks   . Fluoxetine Hcl Other (See Comments)    REACTION: Intolerant  . Nsaids Other (See Comments)    Upper GI bleed   . Paroxetine Other (See Comments)    REACTION: Intolerance   . Ziprasidone Hcl Other (See Comments)     shakes   . Augmentin [Amoxicillin-Pot Clavulanate] Other (See Comments)    Gets yeast infection every time   . Fluoxetine Hcl     REACTION: Intolerant  . Metformin And Related Nausea Only  . Onglyza [Saxagliptin] Nausea Only    ROS     Objective:    Physical Exam  Constitutional: She is oriented to person, place, and time. She appears well-developed and well-nourished.  HENT:  Head: Normocephalic and atraumatic.  Right Ear: External ear normal.  Left Ear: External ear normal.  Nose: Nose normal.  Mouth/Throat: Oropharynx is clear and moist.  TMs and canals are clear. Small cyst behind the ear. No erythema.   Eyes: Pupils are equal, round, and reactive to light. Conjunctivae and EOM are normal.  Neck: Neck supple. No thyromegaly present.  Cardiovascular: Normal rate, regular rhythm and normal heart sounds.  Pulmonary/Chest: Effort normal and breath sounds normal. She has no wheezes.  Lymphadenopathy:    She has no cervical adenopathy.  Neurological: She is alert and oriented to person, place, and time.  Skin: Skin is warm and dry.  Psychiatric: She has a normal mood and affect.    BP 110/64   Pulse (!) 117   Ht 5\' 3"  (1.6 m)  Wt 244 lb (110.7 kg)   SpO2 96%   BMI 43.22 kg/m  Wt Readings from Last 3 Encounters:  01/14/19 244 lb (110.7 kg)  12/29/18 243 lb (110.2 kg)  10/29/18 233 lb (105.7 kg)    Health Maintenance Due  Topic Date Due  . PAP SMEAR-Modifier  12/22/2010  . OPHTHALMOLOGY EXAM  09/26/2014    There are no preventive care reminders to display for this patient.   Lab Results  Component Value Date   TSH 6.51 (H) 09/06/2018   Lab Results  Component Value Date   WBC 12.5 (H) 09/06/2018   HGB 12.6 09/06/2018   HCT 38.3 09/06/2018   MCV 90.3 09/06/2018   PLT 413 (H) 09/06/2018   Lab Results  Component Value Date   NA 140 09/06/2018   K 4.6 09/06/2018   CO2 30 09/06/2018   GLUCOSE 94 09/06/2018   BUN 11 09/06/2018   CREATININE 1.12 (H)  09/06/2018   BILITOT 0.2 09/06/2018   ALKPHOS 122 08/14/2016   AST 13 09/06/2018   ALT 9 09/06/2018   PROT 6.1 09/06/2018   ALBUMIN 4.0 08/14/2016   CALCIUM 9.5 09/06/2018   Lab Results  Component Value Date   CHOL 127 09/06/2018   Lab Results  Component Value Date   HDL 59 09/06/2018   Lab Results  Component Value Date   LDLCALC 45 09/06/2018   Lab Results  Component Value Date   TRIG 147 09/06/2018   Lab Results  Component Value Date   CHOLHDL 2.2 09/06/2018   Lab Results  Component Value Date   HGBA1C 5.7 (A) 08/17/2018       Assessment & Plan:   Problem List Items Addressed This Visit      Other   RLS (restless legs syndrome)    Discussed options.  We can try ropinirole and try to wean the gabapentin.  She will decrease her gabapentin down to 600 mg at bedtime for about 4 to 5 days and then decrease down to half a tab.  For 4 to 5 days and then can try the ropinirole instead.  He does have a follow-up with neurology in about a month and will discuss it with them as well.       Other Visit Diagnoses    Acute non-recurrent sinusitis, unspecified location    -  Primary   Relevant Medications   sodium chloride 0.9 % nebulizer solution   levocetirizine (XYZAL) 5 MG tablet   guaiFENesin (MUCINEX) 600 MG 12 hr tablet   azithromycin (ZITHROMAX) 250 MG tablet   Tobacco abuse         Acute sinusitis-we will treat with azithromycin and will restart allergy medication.  We will also see if they will cover her guaifenesin which she says is helpful.  Tobacco abuse-she would like to restart Chantix.  New prescription sent to pharmacy.  Meds ordered this encounter  Medications  . montelukast (SINGULAIR) 10 MG tablet    Sig: Take 1 tablet (10 mg total) by mouth at bedtime.    Dispense:  30 tablet    Refill:  11  . sodium chloride 0.9 % nebulizer solution    Sig: Take 3 mLs by nebulization as needed for wheezing.    Dispense:  90 mL    Refill:  12  .  levocetirizine (XYZAL) 5 MG tablet    Sig: Take 1 tablet (5 mg total) by mouth every evening.    Dispense:  90 tablet  Refill:  1  . guaiFENesin (MUCINEX) 600 MG 12 hr tablet    Sig: Take 1 tablet (600 mg total) by mouth 2 (two) times daily as needed for to loosen phlegm.    Dispense:  60 tablet    Refill:  PRN  . varenicline (CHANTIX STARTING MONTH PAK) 0.5 MG X 11 & 1 MG X 42 tablet    Sig: Take one 0.5 mg tablet by mouth once daily for 3 days, then increase to one 0.5 mg tablet twice daily for 4 days, then increase to one 1 mg tablet twice daily.    Dispense:  53 tablet    Refill:  0  . rOPINIRole (REQUIP) 0.25 MG tablet    Sig: Take 1-4 tablets (0.25-1 mg total) by mouth at bedtime.    Dispense:  90 tablet    Refill:  2  . azithromycin (ZITHROMAX) 250 MG tablet    Sig: 2 Ttabs PO on Day 1, then one a day x 4 days.    Dispense:  6 tablet    Refill:  0     Nani Gasser, MD

## 2019-01-14 NOTE — Assessment & Plan Note (Signed)
Discussed options.  We can try ropinirole and try to wean the gabapentin.  She will decrease her gabapentin down to 600 mg at bedtime for about 4 to 5 days and then decrease down to half a tab.  For 4 to 5 days and then can try the ropinirole instead.  He does have a follow-up with neurology in about a month and will discuss it with them as well.

## 2019-01-21 ENCOUNTER — Other Ambulatory Visit: Payer: Self-pay | Admitting: Family Medicine

## 2019-01-28 DIAGNOSIS — J449 Chronic obstructive pulmonary disease, unspecified: Secondary | ICD-10-CM | POA: Diagnosis not present

## 2019-02-02 ENCOUNTER — Telehealth: Payer: Self-pay | Admitting: Family Medicine

## 2019-02-02 NOTE — Telephone Encounter (Signed)
error 

## 2019-02-03 ENCOUNTER — Ambulatory Visit (INDEPENDENT_AMBULATORY_CARE_PROVIDER_SITE_OTHER): Payer: Medicare HMO | Admitting: Osteopathic Medicine

## 2019-02-03 ENCOUNTER — Encounter: Payer: Self-pay | Admitting: Osteopathic Medicine

## 2019-02-03 VITALS — BP 137/92 | HR 100 | Temp 98.6°F | Wt 249.4 lb

## 2019-02-03 DIAGNOSIS — F17200 Nicotine dependence, unspecified, uncomplicated: Secondary | ICD-10-CM | POA: Diagnosis not present

## 2019-02-03 DIAGNOSIS — J322 Chronic ethmoidal sinusitis: Secondary | ICD-10-CM | POA: Diagnosis not present

## 2019-02-03 DIAGNOSIS — R69 Illness, unspecified: Secondary | ICD-10-CM | POA: Diagnosis not present

## 2019-02-03 DIAGNOSIS — J441 Chronic obstructive pulmonary disease with (acute) exacerbation: Secondary | ICD-10-CM | POA: Diagnosis not present

## 2019-02-03 MED ORDER — PREDNISONE 10 MG (48) PO TBPK: ORAL_TABLET | Freq: Every day | ORAL | 0 refills | Status: DC

## 2019-02-03 MED ORDER — METHYLPREDNISOLONE SODIUM SUCC 125 MG IJ SOLR
125.0000 mg | Freq: Once | INTRAMUSCULAR | Status: AC
  Administered 2019-02-03: 125 mg via INTRAMUSCULAR

## 2019-02-03 NOTE — Patient Instructions (Signed)
Will treat with steroids - shot in office and oral taper to start tomorrow  Please be taking the following control/prevention medicines consistently:  Flonase nasal spray   Ipratropium/Atrovent nasal spray   Levocetirizine/Xyxal   Montelukast/Singulair  Trelegy inhaler Add the following during respiratory illness:  Guaifenesin 600 mg 12-hour extended release, twice daily   Duoneb and Albuterol Nebulizer as needed  2-3 days of Afrin nasal spray (DO NOT use long-term)  Please make appointments with ENT and Pulmonary to help the chronic sinus issues and COPD.   Please cut back on smoking!

## 2019-02-03 NOTE — Progress Notes (Signed)
HPI: Krystal Burgess is a 58 y.o. female who  has a past medical history of Allergy, Hyperlipidemia, Pain, and Thyroid disease.  [facility], 02/04/19,  for chief complaint of: Sinus issues, Cough, COPD  History of COPD, active smoker.  Patient has had multiple visits and treatments for sinusitis in the past couple of months, several rounds of antibiotics.  Currently, complaining of wheezing, sinus pain and pressure.  Medications reviewed as below.  He states usually when this happens, the most beneficial treatment is a shot of steroid in the office and then a steroid oral taper at home.  She has been referred to ENT and to pulmonology in the past but had to cancel these appointments, she has the contact information for the specialists at home.    Past medical, surgical, social and family history reviewed and updated as necessary.   Current medication list and allergy/intolerance information reviewed:    Current Outpatient Medications  Medication Sig Dispense Refill  . albuterol (VENTOLIN HFA) 108 (90 Base) MCG/ACT inhaler Inhale 2 puffs into the lungs every 4 (four) hours as needed for wheezing or shortness of breath. 18 g 11  . ALPRAZolam (XANAX) 1 MG tablet Take 1 mg by mouth at bedtime as needed.    . AMBULATORY NON FORMULARY MEDICATION Medication Name: ONE TOUCH METER. CHECK BLOOD SUGAR TWICE DAILY. DX:E11.9 1 each 0  . amLODipine (NORVASC) 5 MG tablet Take 1 tablet (5 mg total) by mouth daily. 90 tablet 2  . brimonidine-timolol (COMBIGAN) 0.2-0.5 % ophthalmic solution Place 2 drops into both eyes daily.    . diclofenac sodium (VOLTAREN) 1 % GEL APPLE 4G TO AFFECTED AREA UP TO THREE TIMES A DAY  3  . doxepin (SINEQUAN) 10 MG capsule TAKE 1 CAPSULE BY MOUTH EVERYDAY AT BEDTIME 30 capsule 5  . fluticasone (FLONASE) 50 MCG/ACT nasal spray Place 2 sprays into both nostrils daily. 16 g 3  . furosemide (LASIX) 20 MG tablet TAKE 1  TABLET BY MOUTH EVERY DAY 30 tablet 0  . gabapentin (NEURONTIN) 600 MG tablet Take 600 mg by mouth 3 (three) times daily.  6  . glucose blood test strip TEST TWICE A DAY. DX: E11.9 200 each 11  . guaiFENesin (MUCINEX) 600 MG 12 hr tablet Take 1 tablet (600 mg total) by mouth 2 (two) times daily as needed for to loosen phlegm. 60 tablet PRN  . ipratropium (ATROVENT) 0.06 % nasal spray Place 2 sprays into both nostrils 4 (four) times daily. 45 mL 4  . ipratropium-albuterol (DUONEB) 0.5-2.5 (3) MG/3ML SOLN USE 1 VIAL IN NEBULIZER EVERY 2 TO 3 HOURS 540 mL 11  . JANUVIA 100 MG tablet TAKE 1 TABLET BY MOUTH EVERY DAY 90 tablet 1  . levocetirizine (XYZAL) 5 MG tablet Take 1 tablet (5 mg total) by mouth every evening. 90 tablet 1  . levothyroxine (SYNTHROID, LEVOTHROID) 88 MCG tablet Take 1 tablet (88 mcg total) by mouth daily. 90 tablet 1  . lidocaine (LIDODERM) 5 % APPLY UP TO 3 PATCHES TO AFFECTED AREA DAILY PRN FOR 30 DAYS  3  . losartan (COZAAR) 100 MG tablet Take 1 tablet (100 mg total) by mouth daily. 90 tablet 1  . lurasidone (LATUDA) 40 MG TABS tablet Take by mouth.     . montelukast (SINGULAIR) 10 MG tablet Take 1 tablet (10 mg total) by mouth at bedtime. 30 tablet 11  . mupirocin ointment (BACTROBAN) 2 % Apply to inside  of each nares daily for 10 days then twice a week for maintenance. 30 g 0  . naproxen (NAPROSYN) 500 MG tablet TAKE 1 TABLET (500 MG TOTAL) BY MOUTH 2 (TWO) TIMES DAILY WITH A MEAL. 180 tablet 1  . ONETOUCH DELICA LANCETS 33G MISC USE TWICE DAILY. DX: E11.9 200 each 11  . oxyCODONE-acetaminophen (PERCOCET) 10-325 MG tablet Take 1 tablet by mouth every 6 (six) hours as needed.  0  . OXYGEN Inhale 4 L into the lungs.    . pantoprazole (PROTONIX) 40 MG tablet TAKE 1 TABLET BY MOUTH TWICE A DAY 180 tablet 1  . promethazine (PHENERGAN) 25 MG tablet TAKE 1 TABLET BY MOUTH EVERY 8 HOURS AS NEEDED FOR NAUSEA AND VOMITING 25 tablet 2  . rOPINIRole (REQUIP) 0.25 MG tablet Take 1-4  tablets (0.25-1 mg total) by mouth at bedtime. 90 tablet 2  . rosuvastatin (CRESTOR) 20 MG tablet TAKE 1 TABLET EVERY DAY 90 tablet 3  . sodium chloride 0.9 % nebulizer solution Take 3 mLs by nebulization as needed for wheezing. 90 mL 12  . sucralfate (CARAFATE) 1 GM/10ML suspension TAKE 10 MLS (1 G TOTAL) BY MOUTH 4 (FOUR) TIMES DAILY - WITH MEALS AND AT BEDTIME. 420 mL 3  . theophylline (THEODUR) 300 MG 12 hr tablet TAKE 1 TABLET (300 MG TOTAL) BY MOUTH 2 (TWO) TIMES DAILY. 180 tablet 1  . tiZANidine (ZANAFLEX) 4 MG tablet TAKE 1 TABLET BY MOUTH 2 (TWO) TIMES DAILY AS NEEDED FOR MUSCLE SPASMS. 30 tablet 0  . TRELEGY ELLIPTA 100-62.5-25 MCG/INH AEPB INHALE 1 PUFF DAILY INTO THE LUNGS. 60 each 11  . varenicline (CHANTIX STARTING MONTH PAK) 0.5 MG X 11 & 1 MG X 42 tablet Take one 0.5 mg tablet by mouth once daily for 3 days, then increase to one 0.5 mg tablet twice daily for 4 days, then increase to one 1 mg tablet twice daily. 53 tablet 0  . predniSONE (STERAPRED UNI-PAK 48 TAB) 10 MG (48) TBPK tablet Take by mouth daily. 12-Day taper, po 48 tablet 0   No current facility-administered medications for this visit.     Allergies  Allergen Reactions  . Aripiprazole Other (See Comments)    REACTION: muscle jerks   . Fluoxetine Hcl Other (See Comments)    REACTION: Intolerant  . Nsaids Other (See Comments)    Upper GI bleed   . Paroxetine Other (See Comments)    REACTION: Intolerance   . Ziprasidone Hcl Other (See Comments)    shakes   . Augmentin [Amoxicillin-Pot Clavulanate] Other (See Comments)    Gets yeast infection every time   . Fluoxetine Hcl     REACTION: Intolerant  . Metformin And Related Nausea Only  . Onglyza [Saxagliptin] Nausea Only      Review of Systems:  Constitutional:  No  fever, no chills, +recent illness, No unintentional weight changes. No significant fatigue.   HEENT: No  headache, no vision change, no hearing change, No sore throat, +sinus  pressure  Cardiac: No  chest pain, No  pressure, No palpitations  Respiratory:  +shortness of breath. +Cough  Gastrointestinal: No  abdominal pain, No  nausea, No  vomiting,  No  blood in stool, No  diarrhea  Musculoskeletal: No new myalgia/arthralgia  Skin: No  Rash  Neurologic: No  weakness, No  dizziness  Exam:  BP (!) 137/92 (BP Location: Left Arm, Patient Position: Sitting, Cuff Size: Normal)   Pulse 100   Temp 98.6 F (37 C) (Oral)  Wt 249 lb 6.4 oz (113.1 kg)   SpO2 100%   BMI 44.18 kg/m   Constitutional: VS see above. General Appearance: alert, well-developed, well-nourished, NAD  Eyes: Normal lids and conjunctive, non-icteric sclera  Ears, Nose, Mouth, Throat: MMM, Normal external inspection ears/nares/mouth/lips/gums. TM normal bilaterally. Pharynx/tonsils no erythema, no exudate. Nasal mucosa normal.   Neck: No masses, trachea midline. No tenderness/mass appreciated. No lymphadenopathy  Respiratory: Normal respiratory effort. +diffuse bilaterl wheeze, no rhonchi, no rales  Cardiovascular: S1/S2 normal, no murmur, no rub/gallop auscultated. RRR. No lower extremity edema.   Gastrointestinal: Nontender, no masses. Bowel sounds normal.  Musculoskeletal: Gait normal.   Neurological: Normal balance/coordination. No tremor.   Skin: warm, dry, intact.   Psychiatric: Normal judgment/insight. Normal mood and affect.     ASSESSMENT/PLAN: The primary encounter diagnosis was COPD exacerbation (HCC). Diagnoses of SMOKER and Chronic ethmoidal sinusitis were also pertinent to this visit.  Multiple courses of antibiotics over the past couple of months, I am not convinced that she needs treatment for sinusitis, I think she would most benefit from ENT referral, patient states that she has the information for ENT/pulmonology at home and will contact the specialists to get herself set up with appointments.  Spent time reviewing control/prevention medications and discussed  tobacco cessation.  Meds ordered this encounter  Medications  . predniSONE (STERAPRED UNI-PAK 48 TAB) 10 MG (48) TBPK tablet    Sig: Take by mouth daily. 12-Day taper, po    Dispense:  48 tablet    Refill:  0  . methylPREDNISolone sodium succinate (SOLU-MEDROL) 125 mg/2 mL injection 125 mg     Patient Instructions  Will treat with steroids - shot in office and oral taper to start tomorrow  Please be taking the following control/prevention medicines consistently:  Flonase nasal spray   Ipratropium/Atrovent nasal spray   Levocetirizine/Xyxal   Montelukast/Singulair  Trelegy inhaler Add the following during respiratory illness:  Guaifenesin 600 mg 12-hour extended release, twice daily   Duoneb and Albuterol Nebulizer as needed  2-3 days of Afrin nasal spray (DO NOT use long-term)  Please make appointments with ENT and Pulmonary to help the chronic sinus issues and COPD.   Please cut back on smoking!          Visit summary with medication list and pertinent instructions was printed for patient to review. All questions at time of visit were answered - patient instructed to contact office with any additional concerns or updates. ER/RTC precautions were reviewed with the patient.   Follow-up plan: Return if symptoms worsen or fail to improve, and as directed by Dr Linford Arnold .  Note: Total time spent 25 minutes, greater than 50% of the visit was spent face-to-face counseling and coordinating care for the following: The primary encounter diagnosis was COPD exacerbation (HCC). Diagnoses of SMOKER and Chronic ethmoidal sinusitis were also pertinent to this visit.Marland Kitchen  Please note: voice recognition software was used to produce this document, and typos may escape review. Please contact Dr. Lyn Hollingshead for any needed clarifications.

## 2019-02-11 ENCOUNTER — Telehealth: Payer: Self-pay

## 2019-02-11 NOTE — Telephone Encounter (Signed)
Not prescribe this for her as she is already on pain medications.  She can certainly try some Mucinex which is over-the-counter.

## 2019-02-11 NOTE — Telephone Encounter (Signed)
Krystal Burgess states she is still coughing. She took some of her daughter's cough syrup and it helped her cough. She would like some cough syrup that has mucinex and codeine. Please advise.

## 2019-02-14 ENCOUNTER — Ambulatory Visit: Payer: Self-pay | Admitting: Family Medicine

## 2019-02-14 NOTE — Telephone Encounter (Signed)
Patient advised.

## 2019-02-15 DIAGNOSIS — R69 Illness, unspecified: Secondary | ICD-10-CM | POA: Diagnosis not present

## 2019-02-17 ENCOUNTER — Other Ambulatory Visit: Payer: Self-pay | Admitting: Family Medicine

## 2019-02-26 DIAGNOSIS — J449 Chronic obstructive pulmonary disease, unspecified: Secondary | ICD-10-CM | POA: Diagnosis not present

## 2019-03-01 ENCOUNTER — Ambulatory Visit (INDEPENDENT_AMBULATORY_CARE_PROVIDER_SITE_OTHER): Payer: Medicare HMO | Admitting: Family Medicine

## 2019-03-01 ENCOUNTER — Encounter: Payer: Self-pay | Admitting: Family Medicine

## 2019-03-01 VITALS — BP 136/62 | HR 108 | Temp 98.4°F | Ht 63.0 in | Wt 252.0 lb

## 2019-03-01 DIAGNOSIS — J019 Acute sinusitis, unspecified: Secondary | ICD-10-CM | POA: Diagnosis not present

## 2019-03-01 DIAGNOSIS — Z9981 Dependence on supplemental oxygen: Secondary | ICD-10-CM | POA: Diagnosis not present

## 2019-03-01 DIAGNOSIS — J441 Chronic obstructive pulmonary disease with (acute) exacerbation: Secondary | ICD-10-CM

## 2019-03-01 MED ORDER — GUAIFENESIN 400 MG PO TABS: 400.0000 mg | ORAL_TABLET | Freq: Four times a day (QID) | ORAL | 99 refills | Status: DC | PRN

## 2019-03-01 MED ORDER — PREDNISONE 20 MG PO TABS: 40.0000 mg | ORAL_TABLET | Freq: Every day | ORAL | 0 refills | Status: DC

## 2019-03-01 MED ORDER — DOXYCYCLINE HYCLATE 100 MG PO TABS: 100.0000 mg | ORAL_TABLET | Freq: Two times a day (BID) | ORAL | 0 refills | Status: DC

## 2019-03-01 MED ORDER — METHYLPREDNISOLONE SODIUM SUCC 125 MG IJ SOLR
125.0000 mg | Freq: Once | INTRAMUSCULAR | Status: AC
  Administered 2019-03-01: 125 mg via INTRAMUSCULAR

## 2019-03-01 NOTE — Progress Notes (Signed)
Acute Office Visit  Subjective:    Patient ID: Krystal Burgess, female    DOB: 12-Aug-1961, 58 y.o.   MRN: [id number]  Chief Complaint  Patient presents with  . Sinusitis  . Cough    HPI Patient is in today for cough, sinus congestion and SOB for about 5-6 days.  No fever that she knows of but she does not have a thermometer at home.  She has had some chills and sweats.  She has been using some Goody sinus and a couple other over-the-counter medications.  She says her oxygen has been dropping down to 88% and so she has been having to wear her oxygen constantly.  She says her son-in-law brought a cold into the home and then her grandson got it and now she has it.  She says she is had a lot of wheezing she said her daughter even could hear it from across the room.  Past Medical History:  Diagnosis Date  . Allergy   . Hyperlipidemia   . Pain   . Thyroid disease     Past Surgical History:  Procedure Laterality Date  . ABDOMINAL HYSTERECTOMY    . CESAREAN SECTION    . CHOLECYSTECTOMY    . lipoma removal     RT shoulder  . LUMBAR SPINE SURGERY  2013   Dr. Manson Passey, L4-5  . pilonydal cyst  1994    Family History  Problem Relation Age of Onset  . Diabetes Brother   . Depression Brother        bipolar  . Cancer Brother        throat  . Diabetes Unknown        grandmother  . Depression Mother   . Hyperlipidemia Mother   . Hypertension Mother   . Pancreatic cancer Mother   . Hypertension Father     Social History   Socioeconomic History  . Marital status: Divorced    Spouse name: Not on file  . Number of children: Not on file  . Years of education: Not on file  . Highest education level: Not on file  Occupational History  . Not on file  Social Needs  . Financial resource strain: Not on file  . Food insecurity:    Worry: Not on file    Inability: Not on file  . Transportation needs:    Medical: Not on file    Non-medical: Not on file  Tobacco Use  . Smoking  status: Current Every Day Smoker    Packs/day: 1.00  . Smokeless tobacco: Never Used  Substance and Sexual Activity  . Alcohol use: Not Currently  . Drug use: No  . Sexual activity: Not on file    Comment: s with mother currentlynemployed, on disabilty, divorced, liver  Lifestyle  . Physical activity:    Days per week: Not on file    Minutes per session: Not on file  . Stress: Not on file  Relationships  . Social connections:    Talks on phone: Not on file    Gets together: Not on file    Attends religious service: Not on file    Active member of club or organization: Not on file    Attends meetings of clubs or organizations: Not on file    Relationship status: Not on file  . Intimate partner violence:    Fear of current or ex partner: Not on file    Emotionally abused: Not on file    Physically  abused: Not on file    Forced sexual activity: Not on file  Other Topics Concern  . Not on file  Social History Narrative  . Not on file    Outpatient Medications Prior to Visit  Medication Sig Dispense Refill  . albuterol (VENTOLIN HFA) 108 (90 Base) MCG/ACT inhaler Inhale 2 puffs into the lungs every 4 (four) hours as needed for wheezing or shortness of breath. 18 g 11  . ALPRAZolam (XANAX) 1 MG tablet Take 1 mg by mouth at bedtime as needed.    . AMBULATORY NON FORMULARY MEDICATION Medication Name: ONE TOUCH METER. CHECK BLOOD SUGAR TWICE DAILY. DX:E11.9 1 each 0  . amLODipine (NORVASC) 5 MG tablet Take 1 tablet (5 mg total) by mouth daily. 90 tablet 2  . brimonidine-timolol (COMBIGAN) 0.2-0.5 % ophthalmic solution Place 2 drops into both eyes daily.    . diclofenac sodium (VOLTAREN) 1 % GEL APPLE 4G TO AFFECTED AREA UP TO THREE TIMES A DAY  3  . doxepin (SINEQUAN) 10 MG capsule TAKE 1 CAPSULE BY MOUTH EVERYDAY AT BEDTIME 30 capsule 5  . fluticasone (FLONASE) 50 MCG/ACT nasal spray Place 2 sprays into both nostrils daily. 16 g 3  . furosemide (LASIX) 20 MG tablet TAKE 1 TABLET BY  MOUTH EVERY DAY 30 tablet 0  . gabapentin (NEURONTIN) 600 MG tablet Take 600 mg by mouth 3 (three) times daily.  6  . glucose blood test strip TEST TWICE A DAY. DX: E11.9 200 each 11  . ipratropium (ATROVENT) 0.06 % nasal spray Place 2 sprays into both nostrils 4 (four) times daily. 45 mL 4  . ipratropium-albuterol (DUONEB) 0.5-2.5 (3) MG/3ML SOLN USE 1 VIAL IN NEBULIZER EVERY 2 TO 3 HOURS 540 mL 11  . JANUVIA 100 MG tablet TAKE 1 TABLET BY MOUTH EVERY DAY 90 tablet 1  . levocetirizine (XYZAL) 5 MG tablet Take 1 tablet (5 mg total) by mouth every evening. 90 tablet 1  . levothyroxine (SYNTHROID, LEVOTHROID) 88 MCG tablet Take 1 tablet (88 mcg total) by mouth daily. 90 tablet 1  . lidocaine (LIDODERM) 5 % APPLY UP TO 3 PATCHES TO AFFECTED AREA DAILY PRN FOR 30 DAYS  3  . losartan (COZAAR) 100 MG tablet Take 1 tablet (100 mg total) by mouth daily. 90 tablet 1  . lurasidone (LATUDA) 40 MG TABS tablet Take by mouth.     . montelukast (SINGULAIR) 10 MG tablet Take 1 tablet (10 mg total) by mouth at bedtime. 30 tablet 11  . mupirocin ointment (BACTROBAN) 2 % Apply to inside of each nares daily for 10 days then twice a week for maintenance. 30 g 0  . naproxen (NAPROSYN) 500 MG tablet TAKE 1 TABLET (500 MG TOTAL) BY MOUTH 2 (TWO) TIMES DAILY WITH A MEAL. 180 tablet 1  . ONETOUCH DELICA LANCETS 33G MISC USE TWICE DAILY. DX: E11.9 200 each 11  . oxyCODONE-acetaminophen (PERCOCET) 10-325 MG tablet Take 1 tablet by mouth every 6 (six) hours as needed.  0  . OXYGEN Inhale 4 L into the lungs.    . pantoprazole (PROTONIX) 40 MG tablet TAKE 1 TABLET BY MOUTH TWICE A DAY 180 tablet 1  . promethazine (PHENERGAN) 25 MG tablet TAKE 1 TABLET BY MOUTH EVERY 8 HOURS AS NEEDED FOR NAUSEA AND VOMITING 25 tablet 2  . rOPINIRole (REQUIP) 0.25 MG tablet Take 1-4 tablets (0.25-1 mg total) by mouth at bedtime. 90 tablet 2  . rosuvastatin (CRESTOR) 20 MG tablet TAKE 1 TABLET EVERY DAY  90 tablet 3  . sodium chloride 0.9 %  nebulizer solution Take 3 mLs by nebulization as needed for wheezing. 90 mL 12  . sucralfate (CARAFATE) 1 GM/10ML suspension TAKE 10 MLS (1 G TOTAL) BY MOUTH 4 (FOUR) TIMES DAILY - WITH MEALS AND AT BEDTIME. 420 mL 3  . theophylline (THEODUR) 300 MG 12 hr tablet TAKE 1 TABLET (300 MG TOTAL) BY MOUTH 2 (TWO) TIMES DAILY. 180 tablet 1  . tiZANidine (ZANAFLEX) 4 MG tablet TAKE 1 TABLET BY MOUTH 2 (TWO) TIMES DAILY AS NEEDED FOR MUSCLE SPASMS. 30 tablet 0  . TRELEGY ELLIPTA 100-62.5-25 MCG/INH AEPB INHALE 1 PUFF DAILY INTO THE LUNGS. 60 each 11  . varenicline (CHANTIX STARTING MONTH PAK) 0.5 MG X 11 & 1 MG X 42 tablet Take one 0.5 mg tablet by mouth once daily for 3 days, then increase to one 0.5 mg tablet twice daily for 4 days, then increase to one 1 mg tablet twice daily. 53 tablet 0  . guaiFENesin (MUCINEX) 600 MG 12 hr tablet Take 1 tablet (600 mg total) by mouth 2 (two) times daily as needed for to loosen phlegm. 60 tablet PRN  . predniSONE (STERAPRED UNI-PAK 48 TAB) 10 MG (48) TBPK tablet Take by mouth daily. 12-Day taper, po 48 tablet 0   No facility-administered medications prior to visit.     Allergies  Allergen Reactions  . Aripiprazole Other (See Comments)    REACTION: muscle jerks   . Fluoxetine Hcl Other (See Comments)    REACTION: Intolerant  . Nsaids Other (See Comments)    Upper GI bleed   . Paroxetine Other (See Comments)    REACTION: Intolerance   . Ziprasidone Hcl Other (See Comments)    shakes   . Augmentin [Amoxicillin-Pot Clavulanate] Other (See Comments)    Gets yeast infection every time   . Fluoxetine Hcl     REACTION: Intolerant  . Metformin And Related Nausea Only  . Onglyza [Saxagliptin] Nausea Only    ROS     Objective:    Physical Exam  Constitutional: She is oriented to person, place, and time. She appears well-developed and well-nourished.  HENT:  Head: Normocephalic and atraumatic.  Right Ear: External ear normal.  Left Ear: External ear  normal.  Nose: Nose normal.  Mouth/Throat: Oropharynx is clear and moist.  TMs and canals are clear.   Eyes: Pupils are equal, round, and reactive to light. Conjunctivae and EOM are normal.  Neck: Neck supple. No thyromegaly present.  Cardiovascular: Normal rate, regular rhythm and normal heart sounds.  Pulmonary/Chest: Effort normal and breath sounds normal. She has no wheezes.  Abdominal:  Diffuse rhonchi bilaterally  Lymphadenopathy:    She has no cervical adenopathy.  Neurological: She is alert and oriented to person, place, and time.  Skin: Skin is warm and dry.  Psychiatric: She has a normal mood and affect.    BP 136/62   Pulse (!) 108   Temp 98.4 F (36.9 C)   Ht  (1.6 m)   Wt 252 lb (114.3 kg)   SpO2 93%   BMI 44.64 kg/m  Wt Readings from Last 3 Encounters:  03/01/19 252 lb (114.3 kg)  02/03/19 249 lb 6.4 oz (113.1 kg)  01/14/19 244 lb (110.7 kg)    Health Maintenance Due  Topic Date Due  . PAP SMEAR-Modifier  12/22/2010  . OPHTHALMOLOGY EXAM  09/26/2014  . HEMOGLOBIN A1C  02/17/2019    There are no preventive care reminders to  display for this patient.   Lab Results  Component Value Date   TSH 6.51 (H) 09/06/2018   Lab Results  Component Value Date   WBC 12.5 (H) 09/06/2018   HGB 12.6 09/06/2018   HCT 38.3 09/06/2018   MCV 90.3 09/06/2018   PLT 413 (H) 09/06/2018   Lab Results  Component Value Date   NA 140 09/06/2018   K 4.6 09/06/2018   CO2 30 09/06/2018   GLUCOSE 94 09/06/2018   BUN 11 09/06/2018   CREATININE 1.12 (H) 09/06/2018   BILITOT 0.2 09/06/2018   ALKPHOS 122 08/14/2016   AST 13 09/06/2018   ALT 9 09/06/2018   PROT 6.1 09/06/2018   ALBUMIN 4.0 08/14/2016   CALCIUM 9.5 09/06/2018   Lab Results  Component Value Date   CHOL 127 09/06/2018   Lab Results  Component Value Date   HDL 59 09/06/2018   Lab Results  Component Value Date   LDLCALC 45 09/06/2018   Lab Results  Component Value Date   TRIG 147 09/06/2018    Lab Results  Component Value Date   CHOLHDL 2.2 09/06/2018   Lab Results  Component Value Date   HGBA1C 5.7 (A) 08/17/2018       Assessment & Plan:   Problem List Items Addressed This Visit      Respiratory   COPD exacerbation (HCC) - Primary   Relevant Medications   predniSONE (DELTASONE) 20 MG tablet   guaifenesin (HUMIBID E) 400 MG TABS tablet   methylPREDNISolone sodium succinate (SOLU-MEDROL) 125 mg/2 mL injection 125 mg (Completed)    Other Visit Diagnoses    Acute non-recurrent sinusitis, unspecified location       Relevant Medications   predniSONE (DELTASONE) 20 MG tablet   doxycycline (VIBRA-TABS) 100 MG tablet   guaifenesin (HUMIBID E) 400 MG TABS tablet   methylPREDNISolone sodium succinate (SOLU-MEDROL) 125 mg/2 mL injection 125 mg (Completed)   Oxygen dependent         Given Solu-Medrol 125 mg IM here in the office for more acute treatment per patient's preference.  Will send over prescription for generic guaifenesin.  Going to treat with doxycycline and prednisone for COPD exacerbation.  If not improving over the next week please give [place] call back.  Meds ordered this encounter  Medications  . predniSONE (DELTASONE) 20 MG tablet    Sig: Take 2 tablets (40 mg total) by mouth daily with breakfast.    Dispense:  10 tablet    Refill:  0  . doxycycline (VIBRA-TABS) 100 MG tablet    Sig: Take 1 tablet (100 mg total) by mouth 2 (two) times daily.    Dispense:  20 tablet    Refill:  0  . guaifenesin (HUMIBID E) 400 MG TABS tablet    Sig: Take 1 tablet (400 mg total) by mouth every 6 (six) hours as needed.    Dispense:  360 tablet    Refill:  PRN  . methylPREDNISolone sodium succinate (SOLU-MEDROL) 125 mg/2 mL injection 125 mg     Nani Gasser, MD

## 2019-03-07 ENCOUNTER — Telehealth: Payer: Self-pay

## 2019-03-07 MED ORDER — PREDNISONE 20 MG PO TABS: 40.0000 mg | ORAL_TABLET | Freq: Every day | ORAL | 0 refills | Status: DC

## 2019-03-07 NOTE — Telephone Encounter (Signed)
Pt called reporting that when she takes Singulair, she thinks it gives her an upper respiratory infection. Pt has 4-5 days left of antibiotics and has completed steroids. She had been taking Singulair daily since it was given in January.   Pt requesting another small round of prednisone to help her "get over the hump". Pulse Ox has been running 97-98 with O2, usually 94-95 without oxygen. She has been getting some shortness of breath with moving around.   Please advise, RX to go to [facility]

## 2019-03-07 NOTE — Telephone Encounter (Signed)
RX sent, pt advised. She has stopped Singulair. Will call if not better after prednisone

## 2019-03-07 NOTE — Telephone Encounter (Signed)
To stop the Singulair and see if she feels better.  Okay to refill the prednisone one more time but if she is not better after that then we need to get her in with pulmonology.

## 2019-03-29 DIAGNOSIS — J449 Chronic obstructive pulmonary disease, unspecified: Secondary | ICD-10-CM | POA: Diagnosis not present

## 2019-03-31 ENCOUNTER — Other Ambulatory Visit: Payer: Self-pay | Admitting: Family Medicine

## 2019-04-06 ENCOUNTER — Ambulatory Visit: Payer: Self-pay | Admitting: Family Medicine

## 2019-04-06 NOTE — Progress Notes (Deleted)
Acute Office Visit  Subjective:    Patient ID: Krystal Burgess, female    DOB: December 13, 1961, 58 y.o.   MRN: [id number]  No chief complaint on file.   HPI Patient is in today for SOB with her CODD  Diabetes - no hypoglycemic events. No wounds or sores that are not healing well. No increased thirst or urination. Checking glucose at home. Taking medications as prescribed without any side effects.    Past Medical History:  Diagnosis Date  . Allergy   . Hyperlipidemia   . Pain   . Thyroid disease     Past Surgical History:  Procedure Laterality Date  . ABDOMINAL HYSTERECTOMY    . CESAREAN SECTION    . CHOLECYSTECTOMY    . lipoma removal     RT shoulder  . LUMBAR SPINE SURGERY  2013   Dr. Manson Passey, L4-5  . pilonydal cyst  1994    Family History  Problem Relation Age of Onset  . Diabetes Brother   . Depression Brother        bipolar  . Cancer Brother        throat  . Diabetes Unknown        grandmother  . Depression Mother   . Hyperlipidemia Mother   . Hypertension Mother   . Pancreatic cancer Mother   . Hypertension Father     Social History   Socioeconomic History  . Marital status: Divorced    Spouse name: Not on file  . Number of children: Not on file  . Years of education: Not on file  . Highest education level: Not on file  Occupational History  . Not on file  Social Needs  . Financial resource strain: Not on file  . Food insecurity:    Worry: Not on file    Inability: Not on file  . Transportation needs:    Medical: Not on file    Non-medical: Not on file  Tobacco Use  . Smoking status: Current Every Day Smoker    Packs/day: 1.00  . Smokeless tobacco: Never Used  Substance and Sexual Activity  . Alcohol use: Not Currently  . Drug use: No  . Sexual activity: Not on file    Comment: s with mother currentlynemployed, on disabilty, divorced, liver  Lifestyle  . Physical activity:    Days per week: Not on file    Minutes per session: Not on  file  . Stress: Not on file  Relationships  . Social connections:    Talks on phone: Not on file    Gets together: Not on file    Attends religious service: Not on file    Active member of club or organization: Not on file    Attends meetings of clubs or organizations: Not on file    Relationship status: Not on file  . Intimate partner violence:    Fear of current or ex partner: Not on file    Emotionally abused: Not on file    Physically abused: Not on file    Forced sexual activity: Not on file  Other Topics Concern  . Not on file  Social History Narrative  . Not on file    Outpatient Medications Prior to Visit  Medication Sig Dispense Refill  . albuterol (VENTOLIN HFA) 108 (90 Base) MCG/ACT inhaler Inhale 2 puffs into the lungs every 4 (four) hours as needed for wheezing or shortness of breath. 18 g 11  . ALPRAZolam (XANAX) 1 MG tablet Take  1 mg by mouth at bedtime as needed.    . AMBULATORY NON FORMULARY MEDICATION Medication Name: ONE TOUCH METER. CHECK BLOOD SUGAR TWICE DAILY. DX:E11.9 1 each 0  . amLODipine (NORVASC) 5 MG tablet TAKE 1 TABLET BY MOUTH EVERY DAY 90 tablet 0  . brimonidine-timolol (COMBIGAN) 0.2-0.5 % ophthalmic solution Place 2 drops into both eyes daily.    . diclofenac sodium (VOLTAREN) 1 % GEL APPLE 4G TO AFFECTED AREA UP TO THREE TIMES A DAY  3  . doxepin (SINEQUAN) 10 MG capsule TAKE 1 CAPSULE BY MOUTH EVERYDAY AT BEDTIME 30 capsule 5  . doxycycline (VIBRA-TABS) 100 MG tablet Take 1 tablet (100 mg total) by mouth 2 (two) times daily. 20 tablet 0  . fluticasone (FLONASE) 50 MCG/ACT nasal spray Place 2 sprays into both nostrils daily. 16 g 3  . furosemide (LASIX) 20 MG tablet TAKE 1 TABLET BY MOUTH EVERY DAY 30 tablet 0  . gabapentin (NEURONTIN) 600 MG tablet Take 600 mg by mouth 3 (three) times daily.  6  . glucose blood test strip TEST TWICE A DAY. DX: E11.9 200 each 11  . guaifenesin (HUMIBID E) 400 MG TABS tablet Take 1 tablet (400 mg total) by mouth  every 6 (six) hours as needed. 360 tablet PRN  . ipratropium (ATROVENT) 0.06 % nasal spray Place 2 sprays into both nostrils 4 (four) times daily. 45 mL 4  . ipratropium-albuterol (DUONEB) 0.5-2.5 (3) MG/3ML SOLN USE 1 VIAL IN NEBULIZER EVERY 2 TO 3 HOURS 540 mL 11  . JANUVIA 100 MG tablet TAKE 1 TABLET BY MOUTH EVERY DAY 90 tablet 1  . levocetirizine (XYZAL) 5 MG tablet Take 1 tablet (5 mg total) by mouth every evening. 90 tablet 1  . levothyroxine (SYNTHROID, LEVOTHROID) 88 MCG tablet Take 1 tablet (88 mcg total) by mouth daily. 90 tablet 1  . lidocaine (LIDODERM) 5 % APPLY UP TO 3 PATCHES TO AFFECTED AREA DAILY PRN FOR 30 DAYS  3  . losartan (COZAAR) 100 MG tablet Take 1 tablet (100 mg total) by mouth daily. 90 tablet 1  . lurasidone (LATUDA) 40 MG TABS tablet Take by mouth.     . montelukast (SINGULAIR) 10 MG tablet Take 1 tablet (10 mg total) by mouth at bedtime. 30 tablet 11  . mupirocin ointment (BACTROBAN) 2 % Apply to inside of each nares daily for 10 days then twice a week for maintenance. 30 g 0  . naproxen (NAPROSYN) 500 MG tablet TAKE 1 TABLET (500 MG TOTAL) BY MOUTH 2 (TWO) TIMES DAILY WITH A MEAL. 180 tablet 1  . ONETOUCH DELICA LANCETS 33G MISC USE TWICE DAILY. DX: E11.9 200 each 11  . oxyCODONE-acetaminophen (PERCOCET) 10-325 MG tablet Take 1 tablet by mouth every 6 (six) hours as needed.  0  . OXYGEN Inhale 4 L into the lungs.    . pantoprazole (PROTONIX) 40 MG tablet TAKE 1 TABLET BY MOUTH TWICE A DAY 180 tablet 1  . predniSONE (DELTASONE) 20 MG tablet Take 2 tablets (40 mg total) by mouth daily with breakfast. 10 tablet 0  . promethazine (PHENERGAN) 25 MG tablet TAKE 1 TABLET BY MOUTH EVERY 8 HOURS AS NEEDED FOR NAUSEA AND VOMITING 25 tablet 2  . rOPINIRole (REQUIP) 0.25 MG tablet Take 1-4 tablets (0.25-1 mg total) by mouth at bedtime. 90 tablet 2  . rosuvastatin (CRESTOR) 20 MG tablet TAKE 1 TABLET EVERY DAY 90 tablet 3  . sodium chloride 0.9 % nebulizer solution Take 3 mLs  by  nebulization as needed for wheezing. 90 mL 12  . sucralfate (CARAFATE) 1 GM/10ML suspension TAKE 10 MLS (1 G TOTAL) BY MOUTH 4 (FOUR) TIMES DAILY - WITH MEALS AND AT BEDTIME. 420 mL 3  . theophylline (THEODUR) 300 MG 12 hr tablet TAKE 1 TABLET (300 MG TOTAL) BY MOUTH 2 (TWO) TIMES DAILY. 180 tablet 1  . tiZANidine (ZANAFLEX) 4 MG tablet TAKE 1 TABLET BY MOUTH 2 (TWO) TIMES DAILY AS NEEDED FOR MUSCLE SPASMS. 30 tablet 0  . TRELEGY ELLIPTA 100-62.5-25 MCG/INH AEPB INHALE 1 PUFF DAILY INTO THE LUNGS. 60 each 11  . varenicline (CHANTIX STARTING MONTH PAK) 0.5 MG X 11 & 1 MG X 42 tablet Take one 0.5 mg tablet by mouth once daily for 3 days, then increase to one 0.5 mg tablet twice daily for 4 days, then increase to one 1 mg tablet twice daily. 53 tablet 0   No facility-administered medications prior to visit.     Allergies  Allergen Reactions  . Aripiprazole Other (See Comments)    REACTION: muscle jerks   . Fluoxetine Hcl Other (See Comments)    REACTION: Intolerant  . Nsaids Other (See Comments)    Upper GI bleed   . Paroxetine Other (See Comments)    REACTION: Intolerance   . Ziprasidone Hcl Other (See Comments)    shakes   . Augmentin [Amoxicillin-Pot Clavulanate] Other (See Comments)    Gets yeast infection every time   . Fluoxetine Hcl     REACTION: Intolerant  . Metformin And Related Nausea Only  . Onglyza [Saxagliptin] Nausea Only    ROS     Objective:    Physical Exam  There were no vitals taken for this visit. Wt Readings from Last 3 Encounters:  03/01/19 252 lb (114.3 kg)  02/03/19 249 lb 6.4 oz (113.1 kg)  01/14/19 244 lb (110.7 kg)    Health Maintenance Due  Topic Date Due  . PAP SMEAR-Modifier  12/22/2010  . OPHTHALMOLOGY EXAM  09/26/2014  . HEMOGLOBIN A1C  02/17/2019    There are no preventive care reminders to display for this patient.   Lab Results  Component Value Date   TSH 6.51 (H) 09/06/2018   Lab Results  Component Value Date   WBC  12.5 (H) 09/06/2018   HGB 12.6 09/06/2018   HCT 38.3 09/06/2018   MCV 90.3 09/06/2018   PLT 413 (H) 09/06/2018   Lab Results  Component Value Date   NA 140 09/06/2018   K 4.6 09/06/2018   CO2 30 09/06/2018   GLUCOSE 94 09/06/2018   BUN 11 09/06/2018   CREATININE 1.12 (H) 09/06/2018   BILITOT 0.2 09/06/2018   ALKPHOS 122 08/14/2016   AST 13 09/06/2018   ALT 9 09/06/2018   PROT 6.1 09/06/2018   ALBUMIN 4.0 08/14/2016   CALCIUM 9.5 09/06/2018   Lab Results  Component Value Date   CHOL 127 09/06/2018   Lab Results  Component Value Date   HDL 59 09/06/2018   Lab Results  Component Value Date   LDLCALC 45 09/06/2018   Lab Results  Component Value Date   TRIG 147 09/06/2018   Lab Results  Component Value Date   CHOLHDL 2.2 09/06/2018   Lab Results  Component Value Date   HGBA1C 5.7 (A) 08/17/2018       Assessment & Plan:   Problem List Items Addressed This Visit    None       No orders of the defined types were  placed in this encounter.    Nani Gasseratherine Metheney, MD

## 2019-04-14 ENCOUNTER — Other Ambulatory Visit: Payer: Self-pay | Admitting: Family Medicine

## 2019-04-17 ENCOUNTER — Other Ambulatory Visit: Payer: Self-pay | Admitting: Family Medicine

## 2019-04-26 ENCOUNTER — Other Ambulatory Visit: Payer: Self-pay | Admitting: Family Medicine

## 2019-04-28 DIAGNOSIS — M545 Low back pain: Secondary | ICD-10-CM | POA: Diagnosis not present

## 2019-04-28 DIAGNOSIS — Z79899 Other long term (current) drug therapy: Secondary | ICD-10-CM | POA: Diagnosis not present

## 2019-04-28 DIAGNOSIS — J449 Chronic obstructive pulmonary disease, unspecified: Secondary | ICD-10-CM | POA: Diagnosis not present

## 2019-04-28 DIAGNOSIS — G603 Idiopathic progressive neuropathy: Secondary | ICD-10-CM | POA: Diagnosis not present

## 2019-04-28 DIAGNOSIS — M542 Cervicalgia: Secondary | ICD-10-CM | POA: Diagnosis not present

## 2019-04-28 DIAGNOSIS — G5603 Carpal tunnel syndrome, bilateral upper limbs: Secondary | ICD-10-CM | POA: Diagnosis not present

## 2019-05-10 DIAGNOSIS — R69 Illness, unspecified: Secondary | ICD-10-CM | POA: Diagnosis not present

## 2019-05-24 ENCOUNTER — Other Ambulatory Visit: Payer: Self-pay | Admitting: Family Medicine

## 2019-05-26 ENCOUNTER — Telehealth: Payer: Self-pay | Admitting: Family Medicine

## 2019-05-26 ENCOUNTER — Ambulatory Visit (INDEPENDENT_AMBULATORY_CARE_PROVIDER_SITE_OTHER): Payer: Medicare HMO | Admitting: Family Medicine

## 2019-05-26 ENCOUNTER — Encounter: Payer: Self-pay | Admitting: Family Medicine

## 2019-05-26 VITALS — HR 93 | Ht 63.0 in

## 2019-05-26 DIAGNOSIS — E099 Drug or chemical induced diabetes mellitus without complications: Secondary | ICD-10-CM | POA: Diagnosis not present

## 2019-05-26 DIAGNOSIS — E038 Other specified hypothyroidism: Secondary | ICD-10-CM | POA: Diagnosis not present

## 2019-05-26 DIAGNOSIS — T380X5A Adverse effect of glucocorticoids and synthetic analogues, initial encounter: Secondary | ICD-10-CM | POA: Diagnosis not present

## 2019-05-26 DIAGNOSIS — I10 Essential (primary) hypertension: Secondary | ICD-10-CM

## 2019-05-26 DIAGNOSIS — J441 Chronic obstructive pulmonary disease with (acute) exacerbation: Secondary | ICD-10-CM

## 2019-05-26 DIAGNOSIS — Z720 Tobacco use: Secondary | ICD-10-CM | POA: Diagnosis not present

## 2019-05-26 MED ORDER — PREDNISONE 20 MG PO TABS: ORAL_TABLET | ORAL | 0 refills | Status: DC

## 2019-05-26 NOTE — Telephone Encounter (Signed)
Please call her psychiatrist Dr. Shelly Coss at [facility] and leave a message to see if it would be okay for [place] to use Wellbutrin for smoking cessation with her current psychiatric medications.  Patient reports that she is used it years ago for depression and it never really helped or affected her mood that she remembers.  But she is unable to take Chantix because she says it causes mood swings.  And she is interested in trying Wellbutrin to quit.

## 2019-05-26 NOTE — Progress Notes (Signed)
Virtual Visit via Video Note  I connected with Krystal Burgess on 05/26/19 at  4:00 PM EDT by a video enabled telemedicine application and verified that I am speaking with the correct person using two identifiers.   I discussed the limitations of evaluation and management by telemedicine and the availability of in person appointments. The patient expressed understanding and agreed to proceed.  Pt was at home and I was in my office for the virtual visit.     Subjective:    CC: Diabetes   HPI: Hypertension- Pt denies chest pain, SOB, dizziness, or heart palpitations.  Taking meds as directed w/o problems.  Denies medication side effects.      Diabetes - no hypoglycemic events. No wounds or sores that are not healing well. No increased thirst or urination. Checking glucose at home. Taking medications as prescribed without any side effects.  Pt reports that she has been having issues w/breathing and productive coughing x 2-3 days. She stated that after the grass was mowed is when her sxs began and wanted to know if [name] would give her a prescription for steroids.  She believes that her sinuses are infected due to the color being green and bloody  Worse in the left nostril. Has had to take Tylenol. She has been wheezing some and her chest congestion. .   She is trying to quit smoking and the Chantix is messing with her mood and she stopped this. She wanted to know if she could take the Wellbutrin instead and if this would interfere with the Jordan that she takes.  Says she took Wellbutrin years ago for depression and says it did not really help her mood but she does not really report having any negative side effects with the medication.  Hypothyroidism - Taking medication regularly in the AM away from food and vitamins, etc. No recent change to skin, hair, or energy levels.   She has her eye appt next Fridays.    Past medical history, Surgical history, Family history not pertinant  except as noted below, Social history, Allergies, and medications have been entered into the medical record, reviewed, and corrections made.   Review of Systems: No fevers, chills, night sweats, weight loss, chest pain, or shortness of breath.   Objective:    General: Speaking clearly in complete sentences without any shortness of breath.  Alert and oriented x3.  Normal judgment. No apparent acute distress. Well groomed. Wearing her oxygen    Impression and Recommendations:   DM -overall sounds like her home blood sugars are well controlled.  Encouraged her to go to the lab when she feels safe to do so and make sure to wear facial covering into the building.  We will check her A1c at that time.  Tobacco abuse -discussed missing cessation with her.  She already has some nicotine replacement products but is interested in Wellbutrin.  She sees Dr. Shelly Coss with [facility] so we will just check with her to make sure that the Wellbutrin is safe with the Latuda.  Sinusitis-right now her symptoms are still early so we will hold off on antibiotics but if she is not feeling better after the weekend then I will call something in.  COPD exacerbation-for now we will treat with prednisone.  Typically if she has a more white-colored sputum it is more allergy trigger for her versus if it starts to change color then we usually add antibiotics to her regimen.  Again she will call  me if she feels like she is getting worse or if after the weekend she is not improving.  Hypothyroidism-due to recheck TSH level can adjust dose if needed at that point but she is doing well on her current regimen.   I discussed the assessment and treatment plan with the patient. The patient was provided an opportunity to ask questions and all were answered. The patient agreed with the plan and demonstrated an understanding of the instructions.   The patient was advised to call back or seek an in-person evaluation if  the symptoms worsen or if the condition fails to improve as anticipated.   Nani Gasser, MD

## 2019-05-26 NOTE — Progress Notes (Signed)
BS: 144 Pt reports that she has been having issues w/breathing and coughing x 2-3 days. She stated that after the grass was mowed is when her sxs began and wanted to know if [name] would give her a prescription for steroids.  She believes that her sinuses are infected due to the color being green.   She is trying to quit smoking and the Chantix is messing with her mood and she stopped this. She wanted to know if she could take the Wellbutrin instead and if this would interfere with the Jordan that she takes.   Laureen Ochs, Viann Shove, CMA

## 2019-05-29 DIAGNOSIS — J449 Chronic obstructive pulmonary disease, unspecified: Secondary | ICD-10-CM | POA: Diagnosis not present

## 2019-05-30 MED ORDER — BUPROPION HCL 75 MG PO TABS: 75.0000 mg | ORAL_TABLET | Freq: Two times a day (BID) | ORAL | 1 refills | Status: DC

## 2019-05-30 NOTE — Telephone Encounter (Signed)
Perfect!  Please call pt and let her know I sent the script ot her pharmacy for smoking cessation.

## 2019-05-30 NOTE — Telephone Encounter (Signed)
Left message with [facility] asking for permission to use Wellbutrin for smoking cessation. They are sending a message to Sharlotte Alamo, who is working remotely, and will contact us back.

## 2019-05-30 NOTE — Telephone Encounter (Signed)
Pt advised..Krystal Burgess Lynetta, CMA  

## 2019-05-30 NOTE — Telephone Encounter (Signed)
Dr Lilia Pro states it would be ok at a low dose. She recommends Wellbutrin 75 mg. She recommends the low dose due to possibility of seizures.

## 2019-06-02 DIAGNOSIS — H0100A Unspecified blepharitis right eye, upper and lower eyelids: Secondary | ICD-10-CM | POA: Diagnosis not present

## 2019-06-02 DIAGNOSIS — E119 Type 2 diabetes mellitus without complications: Secondary | ICD-10-CM | POA: Diagnosis not present

## 2019-06-02 DIAGNOSIS — H527 Unspecified disorder of refraction: Secondary | ICD-10-CM | POA: Diagnosis not present

## 2019-06-02 DIAGNOSIS — H02834 Dermatochalasis of left upper eyelid: Secondary | ICD-10-CM | POA: Diagnosis not present

## 2019-06-02 DIAGNOSIS — Z961 Presence of intraocular lens: Secondary | ICD-10-CM | POA: Diagnosis not present

## 2019-06-02 DIAGNOSIS — H0100B Unspecified blepharitis left eye, upper and lower eyelids: Secondary | ICD-10-CM | POA: Diagnosis not present

## 2019-06-02 DIAGNOSIS — H53001 Unspecified amblyopia, right eye: Secondary | ICD-10-CM | POA: Diagnosis not present

## 2019-06-02 DIAGNOSIS — H26493 Other secondary cataract, bilateral: Secondary | ICD-10-CM | POA: Diagnosis not present

## 2019-06-02 DIAGNOSIS — H02831 Dermatochalasis of right upper eyelid: Secondary | ICD-10-CM | POA: Diagnosis not present

## 2019-06-02 DIAGNOSIS — H40033 Anatomical narrow angle, bilateral: Secondary | ICD-10-CM | POA: Diagnosis not present

## 2019-06-02 LAB — HM DIABETES EYE EXAM

## 2019-06-06 ENCOUNTER — Other Ambulatory Visit: Payer: Self-pay | Admitting: Family Medicine

## 2019-06-06 IMAGING — MR MR LUMBAR SPINE W/O CM
4 of 5 series · 26 of 48 positions shown · non-contrast
Comparison: MRI lumbar spine 10/20/2016

CLINICAL DATA: Low back pain extending into the left lower
extremity for 19 years. Weakness.

EXAM:
MRI LUMBAR SPINE WITHOUT CONTRAST
TECHNIQUE: Multiplanar, multisequence MR imaging of the lumbar spine was
performed. No intravenous contrast was administered.

[Series 2: T2 · sagittal · 4.0mm · 0.81mm/px · 6 of 15 slices shown (1 of 2)]
[im 1/15]
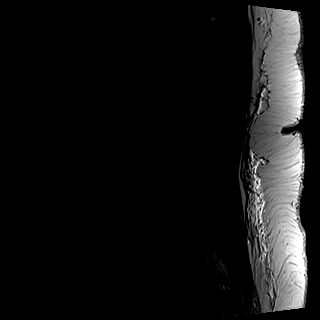
[im 3/15]
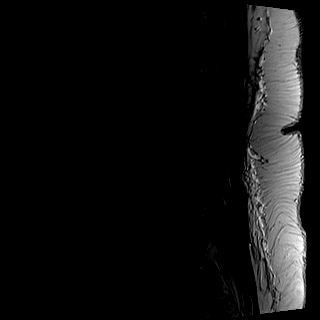
[im 6/15]
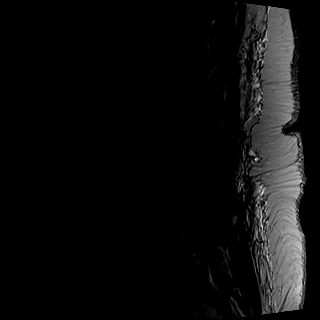
[im 9/15]
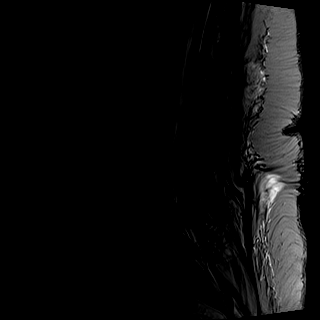
[im 12/15]
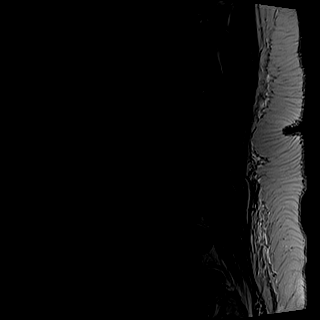
[im 15/15]
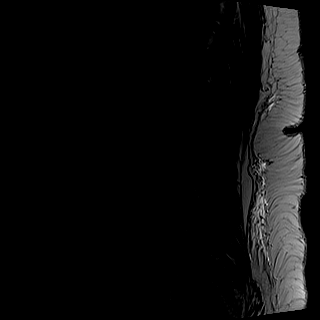

[Series 3: T1 · sagittal · 4.0mm · 0.41mm/px · 6 of 15 slices shown (1 of 2)]
[im 1/15]
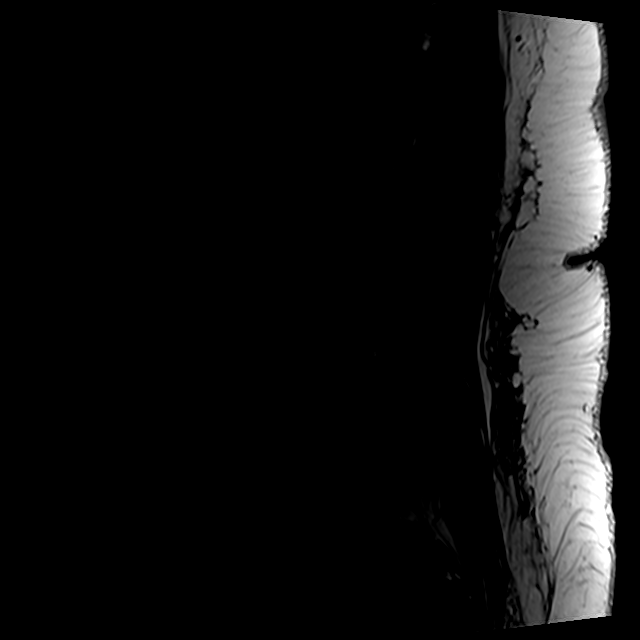
[im 3/15]
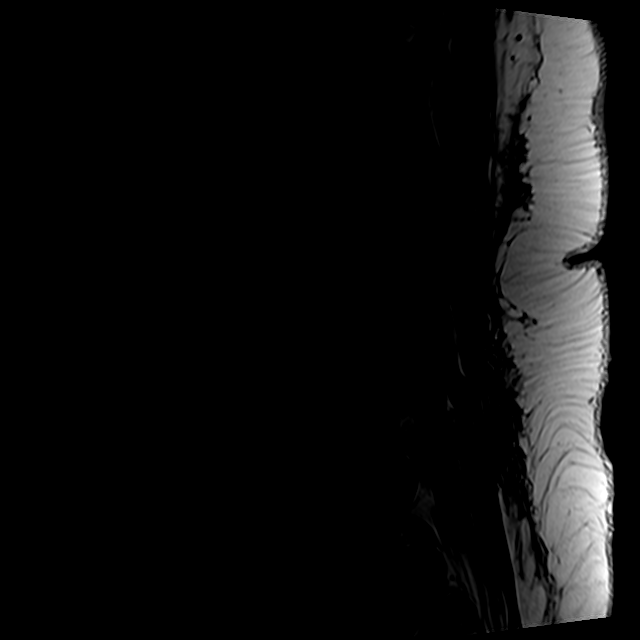
[im 6/15]
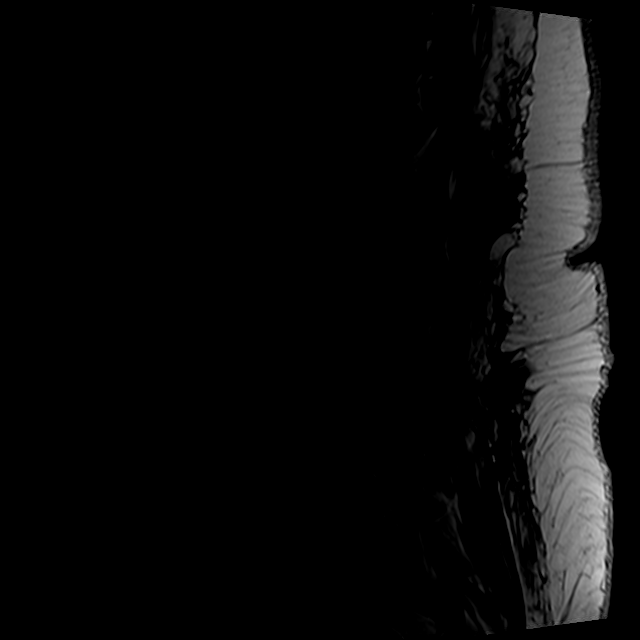
[im 9/15]
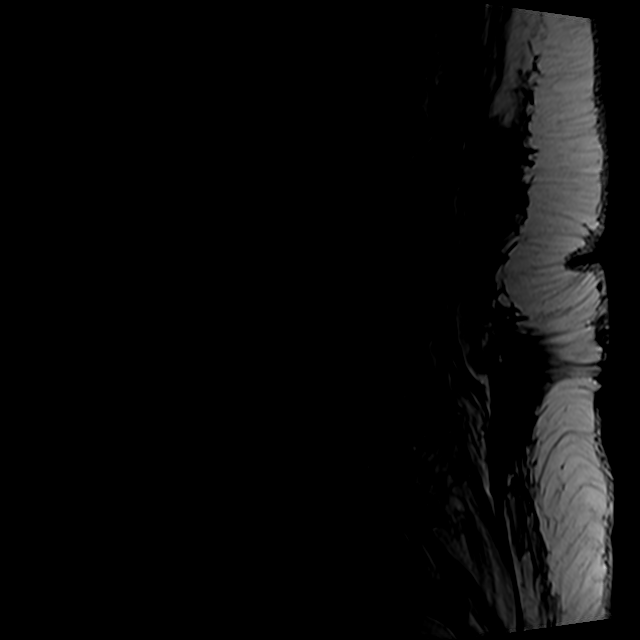
[im 12/15]
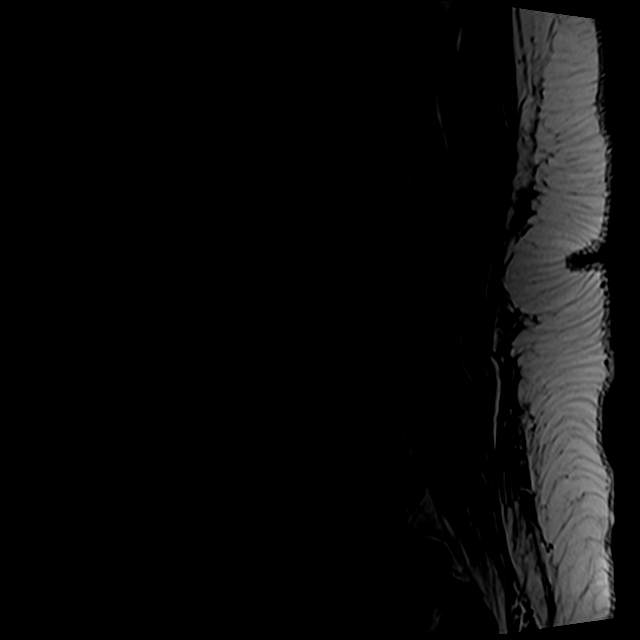
[im 15/15]
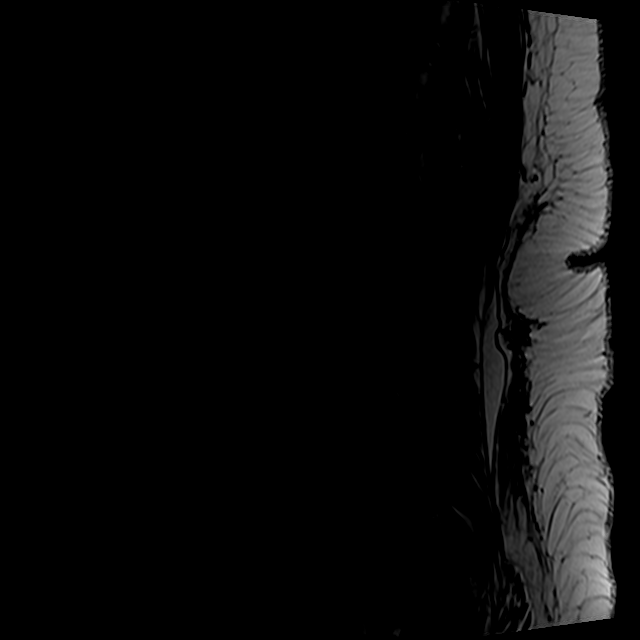

[Series 5: T2 · axial · 4.0mm · 0.78mm/px · z∈[-136,+91]mm · 9 of 41 slices shown (2 of 2)]
[im 1/41]
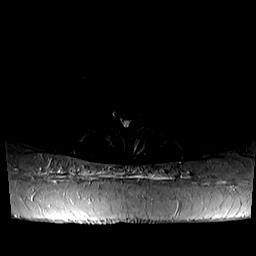
[im 6/41]
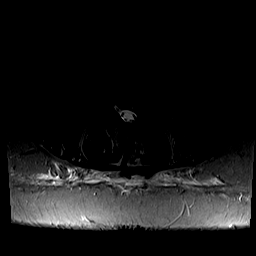
[im 12/41]
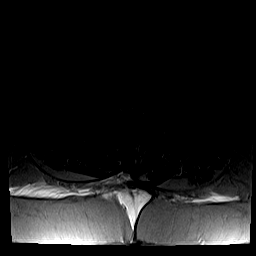
[im 18/41]
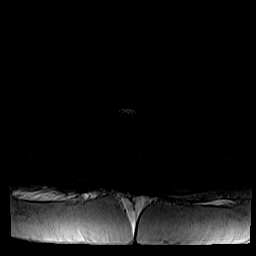
[im 21/41]
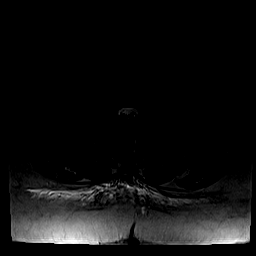
[im 23/41]
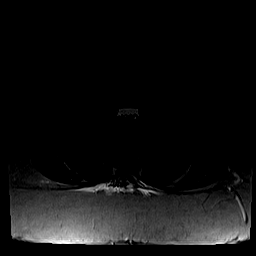
[im 29/41]
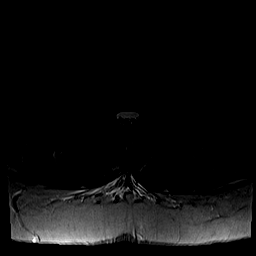
[im 35/41]
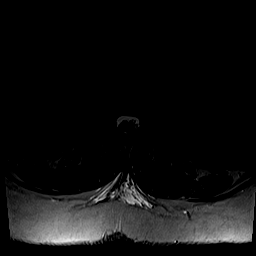
[im 41/41]
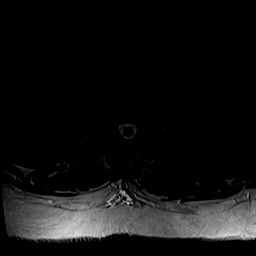

[Series 6: T1 · axial · 4.0mm · 0.39mm/px · z∈[-136,+61]mm · 5 of 41 slices shown (2 of 2)]
[im 1/41]
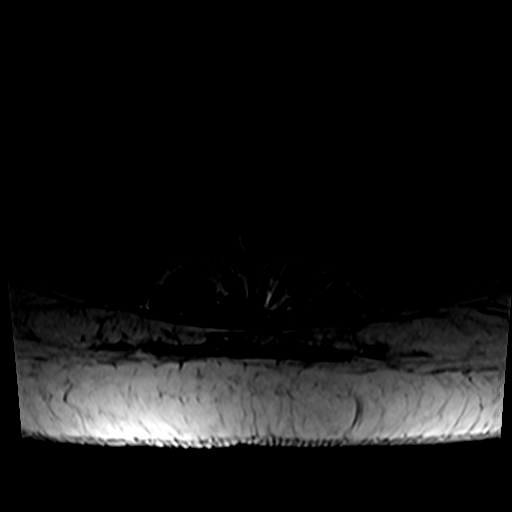
[im 6/41]
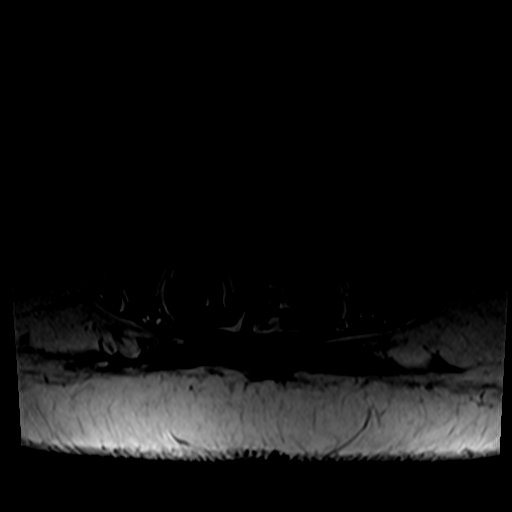
[im 12/41]
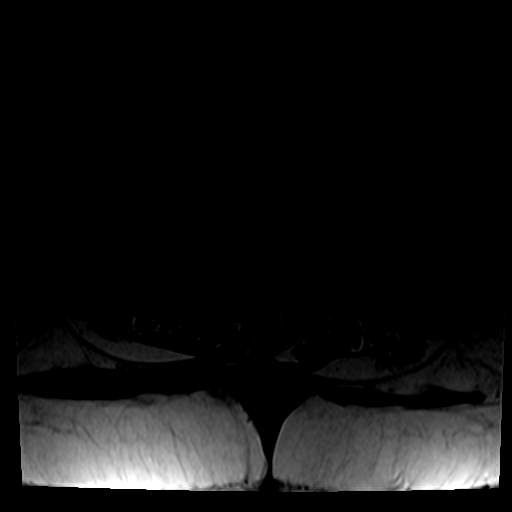
[im 21/41]
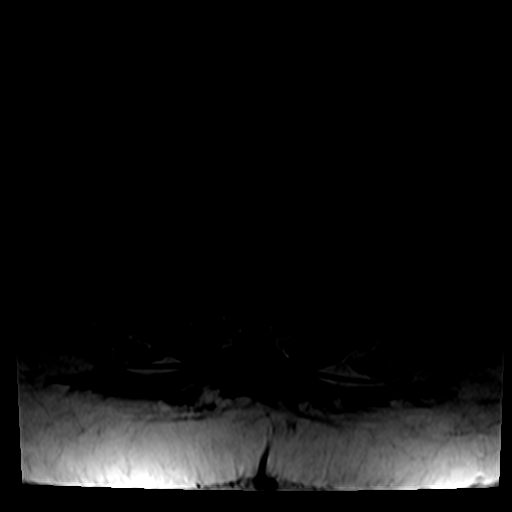
[im 35/41]
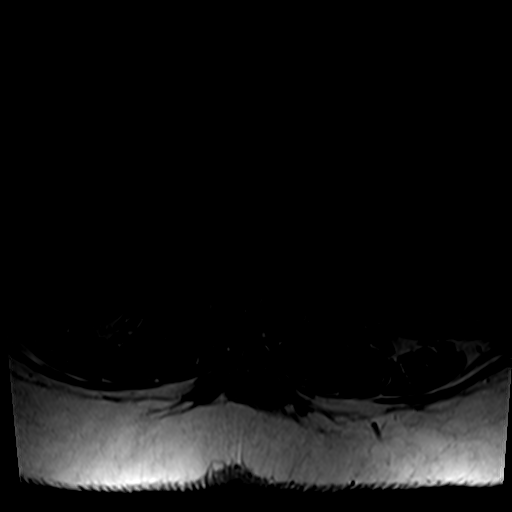

[26 of 48 positions shown; findings below may reference images not displayed]

FINDINGS: Segmentation: 5 non rib-bearing lumbar type vertebral bodies are
present. The lowest fully formed vertebral body is L5.

Alignment: Slight retrolisthesis at L4-5 is stable. AP alignment is
otherwise anatomic.

Vertebrae: Chronic endplate marrow changes progressed at L5-S1.
Marrow signal and vertebral body heights are otherwise normal.

Conus medullaris and cauda equina: Conus extends to the L2 level.
Conus and cauda equina appear normal.

Paraspinal and other soft tissues: Limited imaging of the abdomen is
unremarkable. There is no significant adenopathy.

Disc levels:

L1-2: Negative.

L2-3: Negative.

L3-4: Lateral disc bulging is present. Mild facet hypertrophy is
noted. The central canal is patent. Foramina are patent bilaterally.

L4-5: A right paramedian disc protrusion has progressed. Moderate
right and mild left subarticular narrowing is worse than on the
prior study. Mild foraminal narrowing bilaterally is stable.

L5-S1: Left laminectomy is again noted. Posterior canal is
decompressed. Recurrent disc material is present on the left, likely
impacting the traversing left S1 nerve roots. Moderate foraminal
narrowing is stable, left greater than right.
IMPRESSION: 1. Progressive right paramedian disc protrusion at L4-5 likely
impacts the traversing L5 nerve roots.
2. Recurrent or progressive disc disease at L5-S1 with now mild left
S1 impingement.
3. Stable appearance of moderate foraminal narrowing bilaterally at
L5-S1, left greater than right.

## 2019-06-19 ENCOUNTER — Other Ambulatory Visit: Payer: Self-pay | Admitting: Family Medicine

## 2019-06-19 DIAGNOSIS — J441 Chronic obstructive pulmonary disease with (acute) exacerbation: Secondary | ICD-10-CM

## 2019-06-21 ENCOUNTER — Telehealth: Payer: Self-pay

## 2019-06-21 NOTE — Telephone Encounter (Signed)
[name] called and states she still has shortness of breath. She would like another round of prednisone sent to the pharmacy. She also mentioned needing an antibiotic. Please advise.

## 2019-06-22 ENCOUNTER — Ambulatory Visit (INDEPENDENT_AMBULATORY_CARE_PROVIDER_SITE_OTHER): Payer: Medicare HMO | Admitting: Family Medicine

## 2019-06-22 ENCOUNTER — Encounter: Payer: Self-pay | Admitting: Family Medicine

## 2019-06-22 VITALS — HR 94 | Ht 64.0 in | Wt 252.0 lb

## 2019-06-22 DIAGNOSIS — J320 Chronic maxillary sinusitis: Secondary | ICD-10-CM | POA: Diagnosis not present

## 2019-06-22 DIAGNOSIS — J441 Chronic obstructive pulmonary disease with (acute) exacerbation: Secondary | ICD-10-CM

## 2019-06-22 MED ORDER — CEFDINIR 300 MG PO CAPS: 300.0000 mg | ORAL_CAPSULE | Freq: Two times a day (BID) | ORAL | 0 refills | Status: DC

## 2019-06-22 MED ORDER — ALBUTEROL SULFATE HFA 108 (90 BASE) MCG/ACT IN AERS: 2.0000 | INHALATION_SPRAY | RESPIRATORY_TRACT | 1 refills | Status: DC | PRN

## 2019-06-22 MED ORDER — PREDNISONE 10 MG (48) PO TBPK: ORAL_TABLET | Freq: Every day | ORAL | 0 refills | Status: DC

## 2019-06-22 NOTE — Telephone Encounter (Signed)
Can do a virtual visit with 1 of my partners this afternoon.

## 2019-06-22 NOTE — Telephone Encounter (Signed)
Patient scheduled.

## 2019-06-22 NOTE — Progress Notes (Signed)
Virtual Visit  I connected with      Krystal Burgess  by a telemedicine application and verified that I am speaking with the correct person using two identifiers.   I discussed the limitations of evaluation and management by telemedicine and the availability of in person appointments. The patient expressed understanding and agreed to proceed.  History of Present Illness: Krystal Burgess is a 58 y.o. female who would like to discuss SOB, wheezing and sinus congestions.   Krystal Burgess has a long history of COPD.  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  Her COPD has been well managed by her PCP with Trelegy, albuterol inhaler and nebulizer, theophylline.  She is had trials of Daliresp which was too expensive and not helpful as well as Spiriva which did not help and was not tolerable.  Krystal Burgess notes a few day history of cough shortness of breath wheezing nasal congestion.  Symptoms are typical for early COPD exacerbation.  She has been using her nebulizer which does help temporarily to control her symptoms.  She worries that it may worsen.  She was last treated for a COPD exacerbation about a month ago on June 1 by PCP.  She felt pretty well until recently. She has effectively not left the house nor been exposed to anybody who could potentially have COVID-19 and would like to avoid testing if possible.  Observations/Objective: Pulse 94   Ht 5\' 4"  (1.626 m)   Wt 252 lb (114.3 kg)   SpO2 94% Comment: on room air  BMI 43.26 kg/m Pulse ox on room air.  Improves with nasal cannula oxygen. Wt Readings from Last 5 Encounters:  06/22/19 252 lb (114.3 kg)  03/01/19 252 lb (114.3 kg)  02/03/19 249 lb 6.4 oz (113.1 kg)  01/14/19 244 lb (110.7 kg)  12/29/18 243 lb (110.2 kg)   Exam: Normal Speech.  No severe shortness of breath.  No audible wheezing.  Lab and Radiology Results No results found for this or any previous visit (from the past 72 hour(s)). No results  found.   Assessment and Plan: 58 y.o. female with COPD exacerbation.  Patient has near end-stage COPD.  Plan to treat current exacerbation with tapering 12-day prednisone Dosepak as well as Omnicef antibiotic empirically.  COVID-19 quite unlikely.  Only patient brought up the possibility of chronic steroids.  She at some point in the past she been on 5 mg of prednisone daily but it was stopped when her breathing improved.  She wonders if that would make sense again in the near future.  I will inquire with PCP and will reassess this towards the end of her 12-day tapering dose pack.  Precautions reviewed.  Refilled albuterol inhaler as well.  PDMP not reviewed this encounter. No orders of the defined types were placed in this encounter.  Meds ordered this encounter  Medications  . predniSONE (STERAPRED UNI-PAK 48 TAB) 10 MG (48) TBPK tablet    Sig: Take by mouth daily. 12 day dosepack po    Dispense:  48 tablet    Refill:  0  . cefdinir (OMNICEF) 300 MG capsule    Sig: Take 1 capsule (300 mg total) by mouth 2 (two) times daily.    Dispense:  14 capsule    Refill:  0  . albuterol (VENTOLIN HFA) 108 (90 Base) MCG/ACT inhaler    Sig: Inhale 2 puffs into the lungs every 4 (four) hours as needed for wheezing or shortness of breath.  Dispense:  18 g    Refill:  1    Follow Up Instructions:    I discussed the assessment and treatment plan with the patient. The patient was provided an opportunity to ask questions and all were answered. The patient agreed with the plan and demonstrated an understanding of the instructions.   The patient was advised to call back or seek an in-person evaluation if the symptoms worsen or if the condition fails to improve as anticipated.  Time: 15 minutes of intraservice time, with >22 minutes of total time during today's visit.      Historical information moved to improve visibility of documentation.  Past Medical History:  Diagnosis Date  . Allergy    . Hyperlipidemia   . Pain   . Thyroid disease    Past Surgical History:  Procedure Laterality Date  . ABDOMINAL HYSTERECTOMY    . CESAREAN SECTION    . CHOLECYSTECTOMY    . lipoma removal     RT shoulder  . LUMBAR SPINE SURGERY  2013   Dr. Manson Passey, L4-5  . pilonydal cyst  1994   Social History   Tobacco Use  . Smoking status: Current Every Day Smoker    Packs/day: 1.00  . Smokeless tobacco: Never Used  Substance Use Topics  . Alcohol use: Not Currently   family history includes Cancer in her brother; Depression in her brother and mother; Diabetes in her brother and unknown relative; Hyperlipidemia in her mother; Hypertension in her father and mother; Pancreatic cancer in her mother.  Medications: Current Outpatient Medications  Medication Sig Dispense Refill  . ALPRAZolam (XANAX) 1 MG tablet Take 1 mg by mouth at bedtime as needed.    . AMBULATORY NON FORMULARY MEDICATION Medication Name: ONE TOUCH METER. CHECK BLOOD SUGAR TWICE DAILY. DX:E11.9 1 each 0  . amLODipine (NORVASC) 5 MG tablet TAKE 1 TABLET BY MOUTH EVERY DAY 90 tablet 0  . brimonidine-timolol (COMBIGAN) 0.2-0.5 % ophthalmic solution Place 2 drops into both eyes daily.    Marland Kitchen buPROPion (WELLBUTRIN) 75 MG tablet Take 1 tablet (75 mg total) by mouth 2 (two) times daily. 60 tablet 1  . diclofenac sodium (VOLTAREN) 1 % GEL APPLE 4G TO AFFECTED AREA UP TO THREE TIMES A DAY  3  . doxepin (SINEQUAN) 10 MG capsule TAKE 1 CAPSULE BY MOUTH EVERYDAY AT BEDTIME 30 capsule 5  . fluticasone (FLONASE) 50 MCG/ACT nasal spray SPRAY 2 SPRAYS INTO EACH NOSTRIL EVERY DAY 16 g 3  . gabapentin (NEURONTIN) 600 MG tablet Take 600 mg by mouth 3 (three) times daily.  6  . glucose blood test strip TEST TWICE A DAY. DX: E11.9 200 each 11  . guaifenesin (HUMIBID E) 400 MG TABS tablet Take 1 tablet (400 mg total) by mouth every 6 (six) hours as needed. 360 tablet PRN  . ipratropium (ATROVENT) 0.06 % nasal spray Place 2 sprays into both nostrils 4  (four) times daily. 45 mL 4  . ipratropium-albuterol (DUONEB) 0.5-2.5 (3) MG/3ML SOLN USE 1 VIAL IN NEBULIZER EVERY 2 TO 3 HOURS 540 mL 11  . JANUVIA 100 MG tablet TAKE 1 TABLET BY MOUTH EVERY DAY 90 tablet 1  . levocetirizine (XYZAL) 5 MG tablet Take 1 tablet (5 mg total) by mouth every evening. 90 tablet 1  . levothyroxine (SYNTHROID, LEVOTHROID) 88 MCG tablet Take 1 tablet (88 mcg total) by mouth daily. 90 tablet 1  . lidocaine (LIDODERM) 5 % APPLY UP TO 3 PATCHES TO AFFECTED AREA DAILY PRN  FOR 30 DAYS  3  . losartan (COZAAR) 100 MG tablet Take 1 tablet (100 mg total) by mouth daily. 90 tablet 1  . lurasidone (LATUDA) 40 MG TABS tablet Take by mouth.     . nystatin (MYCOSTATIN) 100000 UNIT/ML suspension TAKE 1 TEASPOONFUL 4 TIMES A DAY 120 mL 0  . ONETOUCH DELICA LANCETS 33G MISC USE TWICE DAILY. DX: E11.9 200 each 11  . oxyCODONE-acetaminophen (PERCOCET) 10-325 MG tablet Take 1 tablet by mouth every 6 (six) hours as needed.  0  . OXYGEN Inhale 4 L into the lungs.    . pantoprazole (PROTONIX) 40 MG tablet TAKE 1 TABLET BY MOUTH TWICE A DAY 180 tablet 1  . promethazine (PHENERGAN) 25 MG tablet TAKE 1 TABLET BY MOUTH EVERY 8 HOURS AS NEEDED FOR NAUSEA AND VOMITING 25 tablet 2  . rosuvastatin (CRESTOR) 20 MG tablet TAKE 1 TABLET EVERY DAY 90 tablet 3  . sucralfate (CARAFATE) 1 GM/10ML suspension TAKE 10 MLS (1 G TOTAL) BY MOUTH 4 (FOUR) TIMES DAILY - WITH MEALS AND AT BEDTIME. 420 mL 3  . theophylline (THEODUR) 300 MG 12 hr tablet TAKE 1 TABLET (300 MG TOTAL) BY MOUTH 2 (TWO) TIMES DAILY. 180 tablet 1  . tiZANidine (ZANAFLEX) 4 MG tablet TAKE 1 TABLET BY MOUTH 2 (TWO) TIMES DAILY AS NEEDED FOR MUSCLE SPASMS. 30 tablet 0  . TRELEGY ELLIPTA 100-62.5-25 MCG/INH AEPB INHALE 1 PUFF DAILY INTO THE LUNGS. 60 each 11  . albuterol (VENTOLIN HFA) 108 (90 Base) MCG/ACT inhaler Inhale 2 puffs into the lungs every 4 (four) hours as needed for wheezing or shortness of breath. 18 g 1  . cefdinir (OMNICEF) 300  MG capsule Take 1 capsule (300 mg total) by mouth 2 (two) times daily. 14 capsule 0  . predniSONE (STERAPRED UNI-PAK 48 TAB) 10 MG (48) TBPK tablet Take by mouth daily. 12 day dosepack po 48 tablet 0   No current facility-administered medications for this visit.    Allergies  Allergen Reactions  . Aripiprazole Other (See Comments)    REACTION: muscle jerks   . Fluoxetine Hcl Other (See Comments)    REACTION: Intolerant  . Nsaids Other (See Comments)    Upper GI bleed   . Paroxetine Other (See Comments)    REACTION: Intolerance   . Ziprasidone Hcl Other (See Comments)    shakes   . Chantix [Varenicline Tartrate] Other (See Comments)    "messing with my mood"  . Metformin And Related Nausea Only  . Montelukast Other (See Comments)    "it kept me sick with an upper respiratory infection"  . Onglyza [Saxagliptin] Nausea Only  . Augmentin [Amoxicillin-Pot Clavulanate] Other (See Comments)    Gets yeast infection every time   . Fluoxetine Hcl     REACTION: Intolerant

## 2019-06-25 ENCOUNTER — Other Ambulatory Visit: Payer: Self-pay | Admitting: Family Medicine

## 2019-06-28 DIAGNOSIS — J449 Chronic obstructive pulmonary disease, unspecified: Secondary | ICD-10-CM | POA: Diagnosis not present

## 2019-06-30 ENCOUNTER — Encounter: Payer: Self-pay | Admitting: Family Medicine

## 2019-07-06 ENCOUNTER — Other Ambulatory Visit: Payer: Self-pay | Admitting: Family Medicine

## 2019-07-11 ENCOUNTER — Other Ambulatory Visit: Payer: Self-pay | Admitting: Family Medicine

## 2019-07-22 ENCOUNTER — Ambulatory Visit (INDEPENDENT_AMBULATORY_CARE_PROVIDER_SITE_OTHER): Payer: Medicare HMO | Admitting: Physician Assistant

## 2019-07-22 ENCOUNTER — Telehealth: Payer: Self-pay

## 2019-07-22 ENCOUNTER — Other Ambulatory Visit: Payer: Self-pay | Admitting: Family Medicine

## 2019-07-22 VITALS — HR 100 | Temp 97.1°F | Ht 64.0 in | Wt 252.0 lb

## 2019-07-22 DIAGNOSIS — L304 Erythema intertrigo: Secondary | ICD-10-CM | POA: Diagnosis not present

## 2019-07-22 DIAGNOSIS — J320 Chronic maxillary sinusitis: Secondary | ICD-10-CM | POA: Diagnosis not present

## 2019-07-22 DIAGNOSIS — J441 Chronic obstructive pulmonary disease with (acute) exacerbation: Secondary | ICD-10-CM

## 2019-07-22 MED ORDER — PREDNISONE 20 MG PO TABS: ORAL_TABLET | ORAL | 0 refills | Status: DC

## 2019-07-22 MED ORDER — NYSTATIN 100000 UNIT/GM EX POWD: Freq: Four times a day (QID) | CUTANEOUS | 1 refills | Status: DC

## 2019-07-22 MED ORDER — CEFDINIR 300 MG PO CAPS: 300.0000 mg | ORAL_CAPSULE | Freq: Two times a day (BID) | ORAL | 0 refills | Status: DC

## 2019-07-22 MED ORDER — CLOTRIMAZOLE-BETAMETHASONE 1-0.05 % EX CREA: 1.0000 "application " | TOPICAL_CREAM | Freq: Two times a day (BID) | CUTANEOUS | 1 refills | Status: DC

## 2019-07-22 NOTE — Telephone Encounter (Signed)
Krystal Burgess has been scheduled today for a virtual visit. She has congestion, some shortness of breath and a dry cough. She is wanting prednisone and an antibiotic.

## 2019-07-22 NOTE — Progress Notes (Signed)
Wants to discuss being on a maintenance dose of prednisone  Sinus issues- started 2 days ago Having trouble breathing while trying to sleep and when moving around due to stuffy nose Last night started with cough, hard to cough up mucous  No Covid exposures

## 2019-07-22 NOTE — Progress Notes (Signed)
Patient ID: TROYE SOLL, female   DOB: Jun 05, 1961, 58 y.o.   MRN: [id number] .Marland KitchenVirtual Visit via Video Note  I connected with [name] on 07/22/19 at  3:40 PM EDT by a video enabled telemedicine application and verified that I am speaking with the correct person using two identifiers.  Location: Patient: home Provider: clinic   I discussed the limitations of evaluation and management by telemedicine and the availability of in person appointments. The patient expressed understanding and agreed to proceed.  History of Present Illness:  Pt is a 58 yo female who calls into today to discuss sinus pressure, wheezing, SOB.   Pt has a long hx of COPD with frequent exacerbations. She continues on Trelegy, albuterol/neb, theophyline.   She has been struggling with sinus congestion and sinus pressure for over a week but now she has more cough and SOB. She has had to increase O2 to 4L and sitting at 95 percent. She is using nebulizer every 3 to 4 hours. No sick contacts. No fever, chills, body aches. She has not left house during [place] pandemic.    She is having a lot of red and irritated skin in the folds of her skin. OTC powders are not working. She would like medication.  .. Active Ambulatory Problems    Diagnosis Date Noted  . Hypothyroidism 02/20/2009  . UNSPECIFIED VITAMIN D DEFICIENCY 07/13/2009  . HYPERLIPIDEMIA 12/12/2008  . Severe obesity (BMI >= 40) (HCC) 05/03/2009  . SMOKER 12/12/2008  . DEPRESSION 12/12/2008  . NEUROPATHY 12/12/2008  . HYPERTENSION, BENIGN 03/26/2009  . ALLERGIC RHINITIS 08/17/2009  . 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 COPD (chronic obstructive pulmonary disease) with chronic bronchitis (HCC) 12/12/2008  . Spinal stenosis of lumbar region 12/12/2008  . LEG EDEMA, BILATERAL 03/26/2009  . Vitamin B 12 deficiency 06/24/2011  . 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 Bipolar disorder (HCC) 01/24/2013  . Bilateral carpal tunnel syndrome 03/14/2013  .  Acute angle-closure glaucoma 10/03/2013  . Diabetes type 2, controlled (HCC) 09/07/2014  . Lumbar degenerative disc disease 08/14/2016  . Pulmonary nodule, RML, needs noncontrast CT in February 2019 01/26/2017  . Chronic insomnia 01/30/2017  . COPD exacerbation (HCC) 09/09/2017  . Chronic ethmoidal sinusitis 01/29/2018  . RLS (restless legs syndrome) 01/14/2019   Resolved Ambulatory Problems    Diagnosis Date Noted  . HYPOKALEMIA 12/12/2008  . ANKLE PAIN, LEFT 02/05/2009  . SINUSITIS 02/20/2009  . Acute non-recurrent pansinusitis 01/29/2018   Past Medical History:  Diagnosis Date  . Allergy   . Hyperlipidemia   . Pain   . Thyroid disease    Reviewed med, allergy, problem list.     Observations/Objective: No acute distress. On O2.  No labored breathing.  Productive cough.   .. Today's Vitals   07/22/19 1458  Pulse: 100  Temp: (!) 97.1 F (36.2 C)  TempSrc: Oral  SpO2: 91%  Weight: 252 lb (114.3 kg)  Height: 5\' 4"  (1.626 m)   Body mass index is 43.26 kg/m.    Assessment and Plan: Marland KitchenMarland KitchenShonique was seen today for cough.  Diagnoses and all orders for this visit:  COPD exacerbation (HCC) -     cefdinir (OMNICEF) 300 MG capsule; Take 1 capsule (300 mg total) by mouth 2 (two) times daily. -     predniSONE (DELTASONE) 20 MG tablet; Take 3 tablets for 3 days, take 2 tablets for 3 days, take 1 tablet for 3 days, take 1/2 tablet for 4 days.  Chronic maxillary sinusitis -  cefdinir (OMNICEF) 300 MG capsule; Take 1 capsule (300 mg total) by mouth 2 (two) times daily. -     predniSONE (DELTASONE) 20 MG tablet; Take 3 tablets for 3 days, take 2 tablets for 3 days, take 1 tablet for 3 days, take 1/2 tablet for 4 days.  Intertrigo -     nystatin (MYCOSTATIN/NYSTOP) powder; Apply topically 4 (four) times daily. -     clotrimazole-betamethasone (LOTRISONE) cream; Apply 1 application topically 2 (two) times daily.   Does not sound like COVID. She is already self  isolating. Continue to do so.  If SOB worsens go to ED if anyone else in house comes down with URI symptoms then have entire family get COVID tested.  Last exacerbation was 06/22/2019. Pt would like to consider daily prednisone for COPD. Looks like she was on 5mg  of prednisone daily in the past. I will send PCP note to review and consider. Encouraged patient to follow up with PCP in next 2 weeks.   Today treated with omnicef due to allergies and prednisone taper. Continue on O2. Use duoneb every 2-4 hours as needed.   For intertrigo use nystatin powder and lotrisone.   Follow Up Instructions:    I discussed the assessment and treatment plan with the patient. The patient was provided an opportunity to ask questions and all were answered. The patient agreed with the plan and demonstrated an understanding of the instructions.   The patient was advised to call back or seek an in-person evaluation if the symptoms worsen or if the condition fails to improve as anticipated.   Iran Planas, PA-C

## 2019-07-23 ENCOUNTER — Other Ambulatory Visit: Payer: Self-pay | Admitting: Family Medicine

## 2019-07-24 ENCOUNTER — Other Ambulatory Visit: Payer: Self-pay | Admitting: Family Medicine

## 2019-07-24 ENCOUNTER — Encounter: Payer: Self-pay | Admitting: Physician Assistant

## 2019-07-24 NOTE — Telephone Encounter (Signed)
Pt is having frequent exacerbations of COPD she has requested to go back on daily 5mg  of prednisone. I deferred to you for decision making. I encouraged to follow up in 2 week.

## 2019-07-25 NOTE — Telephone Encounter (Signed)
I agree. She is over due for followup so we cna discuss a that time.

## 2019-07-29 DIAGNOSIS — J449 Chronic obstructive pulmonary disease, unspecified: Secondary | ICD-10-CM | POA: Diagnosis not present

## 2019-08-14 ENCOUNTER — Other Ambulatory Visit: Payer: Self-pay | Admitting: Family Medicine

## 2019-08-16 ENCOUNTER — Ambulatory Visit (INDEPENDENT_AMBULATORY_CARE_PROVIDER_SITE_OTHER): Payer: Medicare HMO | Admitting: Physician Assistant

## 2019-08-16 VITALS — HR 85 | Temp 96.6°F | Ht 64.0 in | Wt 252.0 lb

## 2019-08-16 DIAGNOSIS — J320 Chronic maxillary sinusitis: Secondary | ICD-10-CM | POA: Diagnosis not present

## 2019-08-16 DIAGNOSIS — J441 Chronic obstructive pulmonary disease with (acute) exacerbation: Secondary | ICD-10-CM

## 2019-08-16 DIAGNOSIS — R0602 Shortness of breath: Secondary | ICD-10-CM

## 2019-08-16 DIAGNOSIS — J449 Chronic obstructive pulmonary disease, unspecified: Secondary | ICD-10-CM | POA: Diagnosis not present

## 2019-08-16 DIAGNOSIS — J4489 Other specified chronic obstructive pulmonary disease: Secondary | ICD-10-CM

## 2019-08-16 MED ORDER — PREDNISONE 20 MG PO TABS: ORAL_TABLET | ORAL | 0 refills | Status: DC

## 2019-08-16 MED ORDER — CEFDINIR 300 MG PO CAPS: 300.0000 mg | ORAL_CAPSULE | Freq: Two times a day (BID) | ORAL | 0 refills | Status: DC

## 2019-08-16 NOTE — Progress Notes (Signed)
Last 3-4 days: SOB Tightness in throat Productive cough - yellow/green in the mornings and wakes up with throat full Wheezing in chest Using nebulizer more than usual, even waking in the night to use it No exposure to any illness Feels like allergy related, sinuses are clogged - taking equate severe congestion relief and BC sinus which works for 4 hours, also using flonase

## 2019-08-16 NOTE — Progress Notes (Signed)
Patient ID: Krystal Burgess, female   DOB: [birth year], 58 y.o.   MRN: [id number]    .Marland KitchenVirtual Visit via Video Note  I connected with [name] on 08/17/19 at  4:20 PM EDT by a video enabled telemedicine application and verified that I am speaking with the correct person using two identifiers.  Location: Patient: home Provider: clinic   I discussed the limitations of evaluation and management by telemedicine and the availability of in person appointments. The patient expressed understanding and agreed to proceed.  History of Present Illness: Pt is a 58 yo morbidly obese female with severe COPD and chronic respiratory disease who calls in with worsening SOB. She has been having exacerbations almost every month for the last 4 months. She is using the nebulizer every 3-4 hours and even waking up to use nebulizer. She stays on 3L of 02. She is severely SOB with walking. No fever, chills, body aches, loss of smell or taste, GI symptoms. She does have a lot of sinus pressure and started blowing out "green/yellow stuff". No covid contacts or sick contacts.   .. Active Ambulatory Problems    Diagnosis Date Noted  . Hypothyroidism 02/20/2009  . UNSPECIFIED VITAMIN D DEFICIENCY 07/13/2009  . HYPERLIPIDEMIA 12/12/2008  . Severe obesity (BMI >= 40) (HCC) 05/03/2009  . SMOKER 12/12/2008  . DEPRESSION 12/12/2008  . NEUROPATHY 12/12/2008  . HYPERTENSION, BENIGN 03/26/2009  . ALLERGIC RHINITIS 08/17/2009  . 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 COPD (chronic obstructive pulmonary disease) with chronic bronchitis (HCC) 12/12/2008  . Spinal stenosis of lumbar region 12/12/2008  . LEG EDEMA, BILATERAL 03/26/2009  . Vitamin B 12 deficiency 06/24/2011  . 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 Bipolar disorder (HCC) 01/24/2013  . Bilateral carpal tunnel syndrome 03/14/2013  . Acute angle-closure glaucoma 10/03/2013  . Diabetes type 2, controlled (HCC) 09/07/2014  . Lumbar degenerative disc  disease 08/14/2016  . Pulmonary nodule, RML, needs noncontrast CT in February 2019 01/26/2017  . Chronic insomnia 01/30/2017  . COPD exacerbation (HCC) 09/09/2017  . Chronic ethmoidal sinusitis 01/29/2018  . RLS (restless legs syndrome) 01/14/2019  . Chronic maxillary sinusitis 08/17/2019   Resolved Ambulatory Problems    Diagnosis Date Noted  . HYPOKALEMIA 12/12/2008  . ANKLE PAIN, LEFT 02/05/2009  . SINUSITIS 02/20/2009  . Acute non-recurrent pansinusitis 01/29/2018   Past Medical History:  Diagnosis Date  . Allergy   . Hyperlipidemia   . Pain   . Thyroid disease    Reviewed med, allergy, problem list.     Observations/Objective: No acute distress. Pt is on 3L of O2.  At times during conversation she "gasp" for air.  Normal mood.   .. Today's Vitals   08/16/19 1429  Pulse: 85  Temp: (!) 96.6 F (35.9 C)  TempSrc: Oral  SpO2: 98%  Weight: 252 lb (114.3 kg)  Height: 5\' 4"  (1.626 m)   Body mass index is 43.26 kg/m.    Assessment and Plan:  Marland KitchenMarland KitchenDezerea was seen today for cough.  Diagnoses and all orders for this visit:  COPD exacerbation (HCC) -     cefdinir (OMNICEF) 300 MG capsule; Take 1 capsule (300 mg total) by mouth 2 (two) times daily. -     predniSONE (DELTASONE) 20 MG tablet; Take 3 tablets for 3 days, take 2 tablets for 3 days, take 1 tablet for 3 days, take 1/2 tablet for 4 days.  Chronic maxillary sinusitis -     cefdinir (OMNICEF) 300 MG capsule; Take 1 capsule (300  mg total) by mouth 2 (two) times daily. -     predniSONE (DELTASONE) 20 MG tablet; Take 3 tablets for 3 days, take 2 tablets for 3 days, take 1 tablet for 3 days, take 1/2 tablet for 4 days.  COPD (chronic obstructive pulmonary disease) with chronic bronchitis (HCC)  SOB (shortness of breath) on exertion   I suspect that current symptoms are more COPD exacerbation and do not necessarily think she needs to start antibiotic. I ask that she start the prednisone first and then if  absolutely need it then start omnicef.   Pt has scheduled appt with PcP to talk about low dose prednisone. She is having exacerbation almost monthly at this point.     Follow Up Instructions:    I discussed the assessment and treatment plan with the patient. The patient was provided an opportunity to ask questions and all were answered. The patient agreed with the plan and demonstrated an understanding of the instructions.   The patient was advised to call back or seek an in-person evaluation if the symptoms worsen or if the condition fails to improve as anticipated.    Iran Planas, PA-C

## 2019-08-17 ENCOUNTER — Encounter: Payer: Self-pay | Admitting: Physician Assistant

## 2019-08-17 DIAGNOSIS — J320 Chronic maxillary sinusitis: Secondary | ICD-10-CM | POA: Insufficient documentation

## 2019-08-29 DIAGNOSIS — J449 Chronic obstructive pulmonary disease, unspecified: Secondary | ICD-10-CM | POA: Diagnosis not present

## 2019-09-04 ENCOUNTER — Other Ambulatory Visit: Payer: Self-pay | Admitting: Family Medicine

## 2019-09-09 ENCOUNTER — Encounter: Payer: Self-pay | Admitting: Family Medicine

## 2019-09-09 ENCOUNTER — Ambulatory Visit (INDEPENDENT_AMBULATORY_CARE_PROVIDER_SITE_OTHER): Payer: Medicare HMO | Admitting: Family Medicine

## 2019-09-09 VITALS — HR 103 | Temp 97.7°F

## 2019-09-09 DIAGNOSIS — J441 Chronic obstructive pulmonary disease with (acute) exacerbation: Secondary | ICD-10-CM

## 2019-09-09 MED ORDER — PREDNISONE 20 MG PO TABS: ORAL_TABLET | ORAL | 0 refills | Status: DC

## 2019-09-09 MED ORDER — PREDNISOLONE 5 MG PO TABS: 5.0000 mg | ORAL_TABLET | Freq: Every day | ORAL | 2 refills | Status: DC

## 2019-09-09 MED ORDER — DOXYCYCLINE HYCLATE 100 MG PO TABS: 100.0000 mg | ORAL_TABLET | Freq: Two times a day (BID) | ORAL | 0 refills | Status: DC

## 2019-09-09 NOTE — Progress Notes (Signed)
Virtual Visit via Video Note  I connected with Krystal MemsPatricia W Neils on 09/09/19 at  3:40 PM EDT by a video enabled telemedicine application and verified that I am speaking with the correct person using two identifiers.   I discussed the limitations of evaluation and management by telemedicine and the availability of in person appointments. The patient expressed understanding and agreed to proceed.    Acute Office Visit  Subjective:    Patient ID: Krystal Burgess, female    DOB: 10-Feb-1961, 58 y.o.   MRN: [id number]  Chief Complaint  Patient presents with  . Cough    HPI Patient is in today for cough, sneezing, and fatigued for at least the last week if not longer.  She said her mother actually passed away at beginning of the month and she had a friend come stay with her.  Her friend had a cold.  So initially she thought she just had cold symptoms herself.  She feels pretty confident that her friend does not have COVID is she pretty much is homebound as well.  Not noticed any significant wheezing but does have a lot of cough and sputum production.  Complains of a runny nose when sitting up and congestion when lying down.  She says the last 2 mornings she is woke up so congested that her oxygen level was down.  Using her nebs every 3-4 hours.  She has not been using her nasal saline rinse.  She says she just feels extremely tired.  She is not a sore throat but just a little bit itchy throat.  No diarrhea.  No significant body aches.  She thinks she is had some mold and mildew exposure in her mother's trailer.  Reports pulse is slightly elevated the last couple days she thinks it is more from the cough medicine.  Past Medical History:  Diagnosis Date  . Allergy   . Hyperlipidemia   . Pain   . Thyroid disease     Past Surgical History:  Procedure Laterality Date  . ABDOMINAL HYSTERECTOMY    . CESAREAN SECTION    . CHOLECYSTECTOMY    . lipoma removal     RT shoulder  . LUMBAR SPINE  SURGERY  2013   Dr. Manson PasseyBrown, L4-5  . pilonydal cyst  1994    Family History  Problem Relation Age of Onset  . Diabetes Brother   . Depression Brother        bipolar  . Cancer Brother        throat  . Diabetes Unknown        grandmother  . Depression Mother   . Hyperlipidemia Mother   . Hypertension Mother   . Pancreatic cancer Mother   . Hypertension Father     Social History   Socioeconomic History  . Marital status: Divorced    Spouse name: Not on file  . Number of children: Not on file  . Years of education: Not on file  . Highest education level: Not on file  Occupational History  . Not on file  Social Needs  . Financial resource strain: Not on file  . Food insecurity    Worry: Not on file    Inability: Not on file  . Transportation needs    Medical: Not on file    Non-medical: Not on file  Tobacco Use  . Smoking status: Current Every Day Smoker    Packs/day: 1.00  . Smokeless tobacco: Never Used  Substance and Sexual Activity  .  Alcohol use: Not Currently  . Drug use: No  . Sexual activity: Not on file    Comment: s with mother currentlynemployed, on disabilty, divorced, liver  Lifestyle  . Physical activity    Days per week: Not on file    Minutes per session: Not on file  . Stress: Not on file  Relationships  . Social Musician on phone: Not on file    Gets together: Not on file    Attends religious service: Not on file    Active member of club or organization: Not on file    Attends meetings of clubs or organizations: Not on file    Relationship status: Not on file  . Intimate partner violence    Fear of current or ex partner: Not on file    Emotionally abused: Not on file    Physically abused: Not on file    Forced sexual activity: Not on file  Other Topics Concern  . Not on file  Social History Narrative  . Not on file    Outpatient Medications Prior to Visit  Medication Sig Dispense Refill  . albuterol (VENTOLIN HFA) 108  (90 Base) MCG/ACT inhaler Inhale 2 puffs into the lungs every 4 (four) hours as needed for wheezing or shortness of breath. 18 g 1  . ALPRAZolam (XANAX) 1 MG tablet Take 1 mg by mouth at bedtime as needed.    . AMBULATORY NON FORMULARY MEDICATION Medication Name: ONE TOUCH METER. CHECK BLOOD SUGAR TWICE DAILY. DX:E11.9 1 each 0  . amLODipine (NORVASC) 5 MG tablet Take 1 tablet (5 mg total) by mouth daily. NEEDS LABS 90 tablet 0  . brimonidine-timolol (COMBIGAN) 0.2-0.5 % ophthalmic solution Place 2 drops into both eyes daily.    Marland Kitchen buPROPion (WELLBUTRIN) 75 MG tablet TAKE 1 TABLET BY MOUTH TWICE A DAY 180 tablet 1  . cefdinir (OMNICEF) 300 MG capsule Take 1 capsule (300 mg total) by mouth 2 (two) times daily. 20 capsule 0  . clotrimazole-betamethasone (LOTRISONE) cream Apply 1 application topically 2 (two) times daily. 45 g 1  . diclofenac sodium (VOLTAREN) 1 % GEL APPLE 4G TO AFFECTED AREA UP TO THREE TIMES A DAY  3  . doxepin (SINEQUAN) 10 MG capsule TAKE 1 CAPSULE BY MOUTH EVERYDAY AT BEDTIME 30 capsule 5  . fluticasone (FLONASE) 50 MCG/ACT nasal spray SPRAY 2 SPRAYS INTO EACH NOSTRIL EVERY DAY 48 mL 1  . gabapentin (NEURONTIN) 600 MG tablet Take 600 mg by mouth 3 (three) times daily.  6  . glucose blood test strip TEST TWICE A DAY. DX: E11.9 200 each 11  . guaifenesin (HUMIBID E) 400 MG TABS tablet Take 1 tablet (400 mg total) by mouth every 6 (six) hours as needed. 360 tablet PRN  . ipratropium (ATROVENT) 0.06 % nasal spray Place 2 sprays into both nostrils 4 (four) times daily. 45 mL 4  . ipratropium-albuterol (DUONEB) 0.5-2.5 (3) MG/3ML SOLN USE 1 VIAL IN NEBULIZER EVERY 2 TO 3 HOURS 540 mL 11  . JANUVIA 100 MG tablet TAKE 1 TABLET BY MOUTH EVERY DAY 90 tablet 1  . levocetirizine (XYZAL) 5 MG tablet Take 1 tablet (5 mg total) by mouth every evening. 90 tablet 1  . levothyroxine (SYNTHROID, LEVOTHROID) 88 MCG tablet Take 1 tablet (88 mcg total) by mouth daily. 90 tablet 1  . lidocaine  (LIDODERM) 5 % APPLY UP TO 3 PATCHES TO AFFECTED AREA DAILY PRN FOR 30 DAYS  3  . losartan (COZAAR)  100 MG tablet TAKE 1 TABLET BY MOUTH EVERY DAY 90 tablet 1  . nystatin (MYCOSTATIN) 100000 UNIT/ML suspension TAKE 1 TEASPOONFUL 4 TIMES A DAY 120 mL 0  . nystatin (MYCOSTATIN/NYSTOP) powder Apply topically 4 (four) times daily. 60 g 1  . ONETOUCH DELICA LANCETS 93X MISC USE TWICE DAILY. DX: E11.9 200 each 11  . oxyCODONE-acetaminophen (PERCOCET) 10-325 MG tablet Take 1 tablet by mouth every 6 (six) hours as needed.  0  . OXYGEN Inhale 4 L into the lungs.    . pantoprazole (PROTONIX) 40 MG tablet TAKE 1 TABLET BY MOUTH TWICE A DAY 180 tablet 1  . promethazine (PHENERGAN) 25 MG tablet TAKE 1 TABLET BY MOUTH EVERY 8 HOURS AS NEEDED FOR NAUSEA AND VOMITING 25 tablet 2  . risperiDONE (RISPERDAL) 1 MG tablet Take 0.5 mg by mouth 2 (two) times daily.    . rosuvastatin (CRESTOR) 20 MG tablet TAKE 1 TABLET EVERY DAY 90 tablet 3  . sucralfate (CARAFATE) 1 GM/10ML suspension TAKE 10 MLS (1 G TOTAL) BY MOUTH 4 (FOUR) TIMES DAILY - WITH MEALS AND AT BEDTIME. 420 mL 3  . theophylline (THEODUR) 300 MG 12 hr tablet TAKE 1 TABLET BY MOUTH 2 TIMES A DAY 180 tablet 1  . tiZANidine (ZANAFLEX) 4 MG tablet TAKE 1 TABLET BY MOUTH 2 (TWO) TIMES DAILY AS NEEDED FOR MUSCLE SPASMS. 30 tablet 0  . TRELEGY ELLIPTA 100-62.5-25 MCG/INH AEPB INHALE 1 PUFF DAILY INTO THE LUNGS. 60 each 11  . predniSONE (DELTASONE) 20 MG tablet Take 3 tablets for 3 days, take 2 tablets for 3 days, take 1 tablet for 3 days, take 1/2 tablet for 4 days. 21 tablet 0   No facility-administered medications prior to visit.     Allergies  Allergen Reactions  . Aripiprazole Other (See Comments)    REACTION: muscle jerks   . Fluoxetine Hcl Other (See Comments)    REACTION: Intolerant  . Nsaids Other (See Comments)    Upper GI bleed   . Paroxetine Other (See Comments)    REACTION: Intolerance   . Ziprasidone Hcl Other (See Comments)    shakes    . Chantix [Varenicline Tartrate] Other (See Comments)    "messing with my mood"  . Metformin And Related Nausea Only  . Montelukast Other (See Comments)    "it kept me sick with an upper respiratory infection"  . Onglyza [Saxagliptin] Nausea Only  . Augmentin [Amoxicillin-Pot Clavulanate] Other (See Comments)    Gets yeast infection every time   . Fluoxetine Hcl     REACTION: Intolerant    ROS     Objective:    Physical Exam  Pulse (!) 103   Temp 97.7 F (36.5 C)   SpO2 97% Comment: on 2 L Wt Readings from Last 3 Encounters:  08/16/19 252 lb (114.3 kg)  07/22/19 252 lb (114.3 kg)  06/22/19 252 lb (114.3 kg)    Health Maintenance Due  Topic Date Due  . PAP SMEAR-Modifier  12/22/2010  . HEMOGLOBIN A1C  02/17/2019  . MAMMOGRAM  04/25/2019  . INFLUENZA VACCINE  07/23/2019  . FOOT EXAM  08/18/2019    There are no preventive care reminders to display for this patient.   Lab Results  Component Value Date   TSH 6.51 (H) 09/06/2018   Lab Results  Component Value Date   WBC 12.5 (H) 09/06/2018   HGB 12.6 09/06/2018   HCT 38.3 09/06/2018   MCV 90.3 09/06/2018   PLT 413 (H) 09/06/2018  Lab Results  Component Value Date   NA 140 09/06/2018   K 4.6 09/06/2018   CO2 30 09/06/2018   GLUCOSE 94 09/06/2018   BUN 11 09/06/2018   CREATININE 1.12 (H) 09/06/2018   BILITOT 0.2 09/06/2018   ALKPHOS 122 08/14/2016   AST 13 09/06/2018   ALT 9 09/06/2018   PROT 6.1 09/06/2018   ALBUMIN 4.0 08/14/2016   CALCIUM 9.5 09/06/2018   Lab Results  Component Value Date   CHOL 127 09/06/2018   Lab Results  Component Value Date   HDL 59 09/06/2018   Lab Results  Component Value Date   LDLCALC 45 09/06/2018   Lab Results  Component Value Date   TRIG 147 09/06/2018   Lab Results  Component Value Date   CHOLHDL 2.2 09/06/2018   Lab Results  Component Value Date   HGBA1C 5.7 (A) 08/17/2018       Assessment & Plan:   Problem List Items Addressed This Visit       Respiratory   COPD exacerbation (HCC) - Primary   Relevant Medications   predniSONE (DELTASONE) 20 MG tablet   prednisoLONE 5 MG TABS tablet     He also had a discussion about restarting her on low-dose prednisone after the exacerbation for a while.  I sent a new prescription for that as well.  Keep regularly scheduled follow-up.  Meds ordered this encounter  Medications  . doxycycline (VIBRA-TABS) 100 MG tablet    Sig: Take 1 tablet (100 mg total) by mouth 2 (two) times daily.    Dispense:  20 tablet    Refill:  0  . predniSONE (DELTASONE) 20 MG tablet    Sig: Take 2 tablets (40 mg total) by mouth daily with breakfast for 4 days, THEN 1 tablet (20 mg total) daily with breakfast for 3 days.    Dispense:  11 tablet    Refill:  0  . prednisoLONE 5 MG TABS tablet    Sig: Take 1 tablet (5 mg total) by mouth daily.    Dispense:  30 tablet    Refill:  2      I discussed the assessment and treatment plan with the patient. The patient was provided an opportunity to ask questions and all were answered. The patient agreed with the plan and demonstrated an understanding of the instructions.   The patient was advised to call back or seek an in-person evaluation if the symptoms worsen or if the condition fails to improve as anticipated.  Nani Gasseratherine Koal Eslinger, MD

## 2019-09-12 DIAGNOSIS — R69 Illness, unspecified: Secondary | ICD-10-CM | POA: Diagnosis not present

## 2019-09-14 ENCOUNTER — Other Ambulatory Visit: Payer: Self-pay | Admitting: Family Medicine

## 2019-09-15 ENCOUNTER — Telehealth: Payer: Self-pay

## 2019-09-15 NOTE — Telephone Encounter (Signed)
Krystal Burgess called and states she only has one more day on the prednisone. She states she was advised to call back if not better. She states she is slightly better. She is wants to extended the dose for prednisone.

## 2019-09-16 MED ORDER — PREDNISONE 20 MG PO TABS: ORAL_TABLET | ORAL | 0 refills | Status: DC

## 2019-09-16 NOTE — Telephone Encounter (Signed)
Left message advising of new prescription.  

## 2019-09-16 NOTE — Telephone Encounter (Signed)
OK, new rx sent.  

## 2019-09-19 DIAGNOSIS — Z20828 Contact with and (suspected) exposure to other viral communicable diseases: Secondary | ICD-10-CM | POA: Diagnosis not present

## 2019-09-19 DIAGNOSIS — I129 Hypertensive chronic kidney disease with stage 1 through stage 4 chronic kidney disease, or unspecified chronic kidney disease: Secondary | ICD-10-CM | POA: Diagnosis not present

## 2019-09-19 DIAGNOSIS — E039 Hypothyroidism, unspecified: Secondary | ICD-10-CM | POA: Diagnosis not present

## 2019-09-19 DIAGNOSIS — R69 Illness, unspecified: Secondary | ICD-10-CM | POA: Diagnosis not present

## 2019-09-19 DIAGNOSIS — I10 Essential (primary) hypertension: Secondary | ICD-10-CM | POA: Diagnosis not present

## 2019-09-19 DIAGNOSIS — E1142 Type 2 diabetes mellitus with diabetic polyneuropathy: Secondary | ICD-10-CM | POA: Diagnosis not present

## 2019-09-19 DIAGNOSIS — R079 Chest pain, unspecified: Secondary | ICD-10-CM | POA: Diagnosis not present

## 2019-09-19 DIAGNOSIS — E1122 Type 2 diabetes mellitus with diabetic chronic kidney disease: Secondary | ICD-10-CM | POA: Diagnosis not present

## 2019-09-19 DIAGNOSIS — E119 Type 2 diabetes mellitus without complications: Secondary | ICD-10-CM | POA: Diagnosis not present

## 2019-09-19 DIAGNOSIS — R069 Unspecified abnormalities of breathing: Secondary | ICD-10-CM | POA: Diagnosis not present

## 2019-09-19 DIAGNOSIS — F319 Bipolar disorder, unspecified: Secondary | ICD-10-CM | POA: Diagnosis not present

## 2019-09-19 DIAGNOSIS — J9612 Chronic respiratory failure with hypercapnia: Secondary | ICD-10-CM | POA: Diagnosis not present

## 2019-09-19 DIAGNOSIS — R0602 Shortness of breath: Secondary | ICD-10-CM | POA: Diagnosis not present

## 2019-09-19 DIAGNOSIS — J441 Chronic obstructive pulmonary disease with (acute) exacerbation: Secondary | ICD-10-CM | POA: Diagnosis not present

## 2019-09-19 DIAGNOSIS — F418 Other specified anxiety disorders: Secondary | ICD-10-CM | POA: Diagnosis not present

## 2019-09-19 DIAGNOSIS — J9611 Chronic respiratory failure with hypoxia: Secondary | ICD-10-CM | POA: Diagnosis not present

## 2019-09-19 DIAGNOSIS — R000 Tachycardia, unspecified: Secondary | ICD-10-CM | POA: Diagnosis not present

## 2019-09-19 DIAGNOSIS — Z720 Tobacco use: Secondary | ICD-10-CM | POA: Diagnosis not present

## 2019-09-19 DIAGNOSIS — N183 Chronic kidney disease, stage 3 (moderate): Secondary | ICD-10-CM | POA: Diagnosis not present

## 2019-09-20 MED ORDER — SODIUM CHLORIDE 0.9 % IV SOLN
10.00 | INTRAVENOUS | Status: DC
Start: ? — End: 2019-09-20

## 2019-09-20 MED ORDER — NITROGLYCERIN 0.4 MG SL SUBL
0.40 | SUBLINGUAL_TABLET | SUBLINGUAL | Status: DC
Start: ? — End: 2019-09-20

## 2019-09-20 MED ORDER — AMLODIPINE BESYLATE 5 MG PO TABS
5.00 | ORAL_TABLET | ORAL | Status: DC
Start: 2019-09-20 — End: 2019-09-20

## 2019-09-20 MED ORDER — ENOXAPARIN SODIUM 40 MG/0.4ML ~~LOC~~ SOLN
40.00 | SUBCUTANEOUS | Status: DC
Start: 2019-09-20 — End: 2019-09-20

## 2019-09-20 MED ORDER — PANTOPRAZOLE SODIUM 40 MG PO TBEC
40.00 | DELAYED_RELEASE_TABLET | ORAL | Status: DC
Start: 2019-09-21 — End: 2019-09-20

## 2019-09-20 MED ORDER — ALBUTEROL SULFATE (2.5 MG/3ML) 0.083% IN NEBU
2.50 | INHALATION_SOLUTION | RESPIRATORY_TRACT | Status: DC
Start: ? — End: 2019-09-20

## 2019-09-20 MED ORDER — FUROSEMIDE 20 MG PO TABS
20.00 | ORAL_TABLET | ORAL | Status: DC
Start: 2019-09-21 — End: 2019-09-20

## 2019-09-20 MED ORDER — GABAPENTIN 400 MG PO CAPS
1200.00 | ORAL_CAPSULE | ORAL | Status: DC
Start: 2019-09-20 — End: 2019-09-20

## 2019-09-20 MED ORDER — RISPERIDONE 1 MG PO TABS
1.00 | ORAL_TABLET | ORAL | Status: DC
Start: 2019-09-20 — End: 2019-09-20

## 2019-09-20 MED ORDER — IPRATROPIUM-ALBUTEROL 0.5-2.5 (3) MG/3ML IN SOLN
3.00 | RESPIRATORY_TRACT | Status: DC
Start: 2019-09-20 — End: 2019-09-20

## 2019-09-20 MED ORDER — GENERIC EXTERNAL MEDICATION
Status: DC
Start: ? — End: 2019-09-20

## 2019-09-20 MED ORDER — ATORVASTATIN CALCIUM 80 MG PO TABS
80.00 | ORAL_TABLET | ORAL | Status: DC
Start: 2019-09-20 — End: 2019-09-20

## 2019-09-20 MED ORDER — ALPRAZOLAM 1 MG PO TABS
1.00 | ORAL_TABLET | ORAL | Status: DC
Start: ? — End: 2019-09-20

## 2019-09-20 MED ORDER — PREDNISONE 20 MG PO TABS: ORAL_TABLET | ORAL | 0 refills | Status: DC

## 2019-09-20 MED ORDER — GENERIC EXTERNAL MEDICATION
Status: DC
Start: 2019-09-20 — End: 2019-09-20

## 2019-09-20 MED ORDER — GENERIC EXTERNAL MEDICATION
300.00 | Status: DC
Start: 2019-09-20 — End: 2019-09-20

## 2019-09-20 MED ORDER — INFLUENZA VAC SUBUNIT QUAD 0.5 ML IM SUSY
0.50 | PREFILLED_SYRINGE | INTRAMUSCULAR | Status: DC
Start: ? — End: 2019-09-20

## 2019-09-20 MED ORDER — NICOTINE 21 MG/24HR TD PT24
1.00 | MEDICATED_PATCH | TRANSDERMAL | Status: DC
Start: 2019-09-21 — End: 2019-09-20

## 2019-09-20 MED ORDER — ALUM & MAG HYDROXIDE-SIMETH 200-200-20 MG/5ML PO SUSP
30.00 | ORAL | Status: DC
Start: ? — End: 2019-09-20

## 2019-09-20 MED ORDER — QUETIAPINE FUMARATE 200 MG PO TABS
300.00 | ORAL_TABLET | ORAL | Status: DC
Start: 2019-09-20 — End: 2019-09-20

## 2019-09-20 MED ORDER — LEVOTHYROXINE SODIUM 88 MCG PO TABS
88.00 | ORAL_TABLET | ORAL | Status: DC
Start: 2019-09-21 — End: 2019-09-20

## 2019-09-20 MED ORDER — GABAPENTIN 300 MG PO CAPS
300.00 | ORAL_CAPSULE | ORAL | Status: DC
Start: 2019-09-21 — End: 2019-09-20

## 2019-09-20 MED ORDER — SITAGLIPTIN PHOSPHATE 50 MG PO TABS
100.00 | ORAL_TABLET | ORAL | Status: DC
Start: 2019-09-21 — End: 2019-09-20

## 2019-09-20 MED ORDER — PNEUMOCOCCAL VAC POLYVALENT 25 MCG/0.5ML IJ INJ
0.50 | INJECTION | INTRAMUSCULAR | Status: DC
Start: ? — End: 2019-09-20

## 2019-09-20 MED ORDER — GENERIC EXTERNAL MEDICATION
2.00 | Status: DC
Start: 2019-09-20 — End: 2019-09-20

## 2019-09-20 MED ORDER — METHYLPREDNISOLONE SODIUM SUCC 125 MG IJ SOLR
80.00 | INTRAMUSCULAR | Status: DC
Start: 2019-09-20 — End: 2019-09-20

## 2019-09-20 MED ORDER — DOXEPIN HCL 10 MG PO CAPS
10.00 | ORAL_CAPSULE | ORAL | Status: DC
Start: 2019-09-20 — End: 2019-09-20

## 2019-09-20 MED ORDER — POLYETHYLENE GLYCOL 3350 17 G PO PACK
17.00 | PACK | ORAL | Status: DC
Start: ? — End: 2019-09-20

## 2019-09-20 MED ORDER — MICONAZOLE NITRATE 2 % EX POWD
CUTANEOUS | Status: DC
Start: 2019-09-20 — End: 2019-09-20

## 2019-09-20 MED ORDER — PROMETHAZINE HCL 25 MG PO TABS
25.00 | ORAL_TABLET | ORAL | Status: DC
Start: ? — End: 2019-09-20

## 2019-09-20 MED ORDER — GENERIC EXTERNAL MEDICATION
500.00 | Status: DC
Start: 2019-09-20 — End: 2019-09-20

## 2019-09-20 MED ORDER — IPRATROPIUM BROMIDE 0.06 % NA SOLN
1.00 | NASAL | Status: DC
Start: ? — End: 2019-09-20

## 2019-09-20 MED ORDER — LOSARTAN POTASSIUM 100 MG PO TABS
100.00 | ORAL_TABLET | ORAL | Status: DC
Start: 2019-09-21 — End: 2019-09-20

## 2019-09-20 MED ORDER — LORATADINE 10 MG PO TABS
10.00 | ORAL_TABLET | ORAL | Status: DC
Start: 2019-09-21 — End: 2019-09-20

## 2019-09-20 MED ORDER — BUDESONIDE-FORMOTEROL FUMARATE 80-4.5 MCG/ACT IN AERO
2.00 | INHALATION_SPRAY | RESPIRATORY_TRACT | Status: DC
Start: 2019-09-20 — End: 2019-09-20

## 2019-09-20 MED ORDER — GENERIC EXTERNAL MEDICATION
90.00 | Status: DC
Start: 2019-09-20 — End: 2019-09-20

## 2019-09-20 MED ORDER — OXYCODONE HCL 5 MG PO TABS
7.50 | ORAL_TABLET | ORAL | Status: DC
Start: ? — End: 2019-09-20

## 2019-09-20 MED ORDER — ACETAMINOPHEN 325 MG PO TABS
650.00 | ORAL_TABLET | ORAL | Status: DC
Start: ? — End: 2019-09-20

## 2019-09-20 MED ORDER — GENERIC EXTERNAL MEDICATION
300.00 | Status: DC
Start: 2019-09-21 — End: 2019-09-20

## 2019-09-20 MED ORDER — TIZANIDINE HCL 4 MG PO TABS
4.00 | ORAL_TABLET | ORAL | Status: DC
Start: ? — End: 2019-09-20

## 2019-09-20 MED ORDER — IPRATROPIUM-ALBUTEROL 0.5-2.5 (3) MG/3ML IN SOLN
3.00 | RESPIRATORY_TRACT | Status: DC
Start: ? — End: 2019-09-20

## 2019-09-20 NOTE — Telephone Encounter (Signed)
Krystal Burgess states should wants another round of prednisone. She wants to start with the 60 mg dose. She went to the ED and was admitted. She left AMA. I advised her she needs an appointment. She states she is not coming in without taking a bath and she can't take a bath until she gets the prednisone.

## 2019-09-20 NOTE — Telephone Encounter (Signed)
I did go ahead and send a prescription but I am concerned that she left AMA.  If she was sick enough that they felt she needed to be admitted then she really needs to reconsider going pack.

## 2019-09-21 NOTE — Telephone Encounter (Signed)
Patient advised.

## 2019-09-27 ENCOUNTER — Encounter: Payer: Self-pay | Admitting: Family Medicine

## 2019-09-27 ENCOUNTER — Ambulatory Visit (INDEPENDENT_AMBULATORY_CARE_PROVIDER_SITE_OTHER): Payer: Medicare HMO | Admitting: Family Medicine

## 2019-09-27 VITALS — BP 147/87 | HR 95

## 2019-09-27 DIAGNOSIS — E538 Deficiency of other specified B group vitamins: Secondary | ICD-10-CM | POA: Diagnosis not present

## 2019-09-27 DIAGNOSIS — J441 Chronic obstructive pulmonary disease with (acute) exacerbation: Secondary | ICD-10-CM

## 2019-09-27 MED ORDER — PREDNISONE 20 MG PO TABS: ORAL_TABLET | ORAL | 0 refills | Status: DC

## 2019-09-27 NOTE — Progress Notes (Addendum)
Virtual Visit via Video Note  I connected with Krystal Burgess on 10/02/19 at  4:00 PM EDT by a video enabled telemedicine application and verified that I am speaking with the correct person using two identifiers.   I discussed the limitations of evaluation and management by telemedicine and the availability of in person appointments. The patient expressed understanding and agreed to proceed.    Acute Office Visit  Subjective:    Patient ID: Krystal Burgess, female    DOB: 1961/08/02, 58 y.o.   MRN: [id number]  Chief Complaint  Patient presents with  . Hospitalization Follow-up    copd    HPI Patient is in today for COPD exacerbation.  I saw her on September 18 for a virtual visit for COPD exacerbation.  He was treated initially with antibiotics and steroids.  She had been seen also on August 25 and again prior to that on July 31 for similar symptoms.  She actually called back on September 24 requesting more prednisone.  She felt like she was getting a little better but just still not quite there.  She did not feel like she had enough energy to get a bath.  She then ended up going to the ED 4 days later.  They actually wanted to admit her but she left AMA.  She was given IV Rocephin and IV azithromycin.  She was also given IV steroids.  She says she does feel about 75% better but feels like she just needs a little bit longer course on the prednisone.  She feels like finally a lot of the congestion in her throat and nose is finally clearing up.  She still continues to smoke.  She said when she got home she also realized that they had not change the filters on her oxygen compressor in quite a long time.  So she feels like since changing those it has helped greatly as well.   He says she will try to make a follow-up appointment in 1 to 2 weeks.  She wants to go ahead and get her blood work done at that time and wants to have her be looked 12 levels rechecked.  They were low at one point in time  and she was getting injections but more recently has been taking 1000 mcg daily of over-the-counter B12.  Past Medical History:  Diagnosis Date  . Allergy   . Hyperlipidemia   . Pain   . Thyroid disease     Past Surgical History:  Procedure Laterality Date  . ABDOMINAL HYSTERECTOMY    . CESAREAN SECTION    . CHOLECYSTECTOMY    . lipoma removal     RT shoulder  . LUMBAR SPINE SURGERY  2013   Dr. Owens Shark, L4-5  . pilonydal cyst  1994    Family History  Problem Relation Age of Onset  . Diabetes Brother   . Depression Brother        bipolar  . Cancer Brother        throat  . Diabetes Unknown        grandmother  . Depression Mother   . Hyperlipidemia Mother   . Hypertension Mother   . Pancreatic cancer Mother   . Hypertension Father     Social History   Socioeconomic History  . Marital status: Divorced    Spouse name: Not on file  . Number of children: Not on file  . Years of education: Not on file  . Highest education level: Not  on file  Occupational History  . Not on file  Social Needs  . Financial resource strain: Not on file  . Food insecurity    Worry: Not on file    Inability: Not on file  . Transportation needs    Medical: Not on file    Non-medical: Not on file  Tobacco Use  . Smoking status: Current Every Day Smoker    Packs/day: 1.00  . Smokeless tobacco: Never Used  Substance and Sexual Activity  . Alcohol use: Not Currently  . Drug use: No  . Sexual activity: Not on file    Comment: s with mother currentlynemployed, on disabilty, divorced, liver  Lifestyle  . Physical activity    Days per week: Not on file    Minutes per session: Not on file  . Stress: Not on file  Relationships  . Social Musicianconnections    Talks on phone: Not on file    Gets together: Not on file    Attends religious service: Not on file    Active member of club or organization: Not on file    Attends meetings of clubs or organizations: Not on file    Relationship status:  Not on file  . Intimate partner violence    Fear of current or ex partner: Not on file    Emotionally abused: Not on file    Physically abused: Not on file    Forced sexual activity: Not on file  Other Topics Concern  . Not on file  Social History Narrative  . Not on file    Outpatient Medications Prior to Visit  Medication Sig Dispense Refill  . albuterol (VENTOLIN HFA) 108 (90 Base) MCG/ACT inhaler Inhale 2 puffs into the lungs every 4 (four) hours as needed for wheezing or shortness of breath. 18 g 1  . ALPRAZolam (XANAX) 1 MG tablet Take 1 mg by mouth at bedtime as needed.    . AMBULATORY NON FORMULARY MEDICATION Medication Name: ONE TOUCH METER. CHECK BLOOD SUGAR TWICE DAILY. DX:E11.9 1 each 0  . amLODipine (NORVASC) 5 MG tablet Take 1 tablet (5 mg total) by mouth daily. NEEDS LABS 90 tablet 0  . clotrimazole-betamethasone (LOTRISONE) cream Apply 1 application topically 2 (two) times daily. 45 g 1  . diclofenac sodium (VOLTAREN) 1 % GEL APPLE 4G TO AFFECTED AREA UP TO THREE TIMES A DAY  3  . doxepin (SINEQUAN) 10 MG capsule TAKE 1 CAPSULE BY MOUTH EVERYDAY AT BEDTIME 30 capsule 5  . fluticasone (FLONASE) 50 MCG/ACT nasal spray SPRAY 2 SPRAYS INTO EACH NOSTRIL EVERY DAY 48 mL 1  . gabapentin (NEURONTIN) 600 MG tablet Take 600 mg by mouth at bedtime.   6  . glucose blood test strip TEST TWICE A DAY. DX: E11.9 200 each 11  . guaifenesin (HUMIBID E) 400 MG TABS tablet Take 1 tablet (400 mg total) by mouth every 6 (six) hours as needed. 360 tablet PRN  . ipratropium (ATROVENT) 0.06 % nasal spray Place 2 sprays into both nostrils 4 (four) times daily. 45 mL 4  . ipratropium-albuterol (DUONEB) 0.5-2.5 (3) MG/3ML SOLN USE 1 VIAL IN NEBULIZER EVERY 2 TO 3 HOURS 540 mL 11  . JANUVIA 100 MG tablet TAKE 1 TABLET BY MOUTH EVERY DAY 90 tablet 1  . levocetirizine (XYZAL) 5 MG tablet Take 1 tablet (5 mg total) by mouth every evening. 90 tablet 1  . levothyroxine (SYNTHROID, LEVOTHROID) 88 MCG  tablet Take 1 tablet (88 mcg total) by mouth daily.  90 tablet 1  . lidocaine (LIDODERM) 5 % APPLY UP TO 3 PATCHES TO AFFECTED AREA DAILY PRN FOR 30 DAYS  3  . losartan (COZAAR) 100 MG tablet TAKE 1 TABLET BY MOUTH EVERY DAY 90 tablet 1  . nystatin (MYCOSTATIN) 100000 UNIT/ML suspension TAKE 1 TEASPOONFUL 4 TIMES A DAY 120 mL 0  . nystatin (MYCOSTATIN/NYSTOP) powder Apply topically 4 (four) times daily. 60 g 1  . ONETOUCH DELICA LANCETS 33G MISC USE TWICE DAILY. DX: E11.9 200 each 11  . oxyCODONE-acetaminophen (PERCOCET) 10-325 MG tablet Take 1 tablet by mouth every 6 (six) hours as needed.  0  . OXYGEN Inhale 4 L into the lungs.    . pantoprazole (PROTONIX) 40 MG tablet TAKE 1 TABLET BY MOUTH TWICE A DAY 180 tablet 1  . promethazine (PHENERGAN) 25 MG tablet TAKE 1 TABLET BY MOUTH EVERY 8 HOURS AS NEEDED FOR NAUSEA AND VOMITING 25 tablet 2  . risperiDONE (RISPERDAL) 1 MG tablet Take 0.5 mg by mouth 2 (two) times daily.    . rosuvastatin (CRESTOR) 20 MG tablet TAKE 1 TABLET BY MOUTH EVERY DAY 90 tablet 3  . sucralfate (CARAFATE) 1 GM/10ML suspension TAKE 10 MLS (1 G TOTAL) BY MOUTH 4 (FOUR) TIMES DAILY - WITH MEALS AND AT BEDTIME. 420 mL 3  . theophylline (THEODUR) 300 MG 12 hr tablet TAKE 1 TABLET BY MOUTH 2 TIMES A DAY 180 tablet 1  . tiZANidine (ZANAFLEX) 4 MG tablet TAKE 1 TABLET BY MOUTH 2 (TWO) TIMES DAILY AS NEEDED FOR MUSCLE SPASMS. 30 tablet 0  . TRELEGY ELLIPTA 100-62.5-25 MCG/INH AEPB INHALE 1 PUFF DAILY INTO THE LUNGS. 60 each 11  . predniSONE (DELTASONE) 20 MG tablet Take 3 tablets (60 mg total) by mouth daily with breakfast for 4 days, THEN 2 tablets (40 mg total) daily with breakfast for 3 days, THEN 1 tablet (20 mg total) daily with breakfast for 3 days. 21 tablet 0  . prednisoLONE 5 MG TABS tablet Take 1 tablet (5 mg total) by mouth daily. (Patient not taking: Reported on 09/27/2019) 30 tablet 2  . brimonidine-timolol (COMBIGAN) 0.2-0.5 % ophthalmic solution Place 2 drops into both  eyes daily.    Marland Kitchen. buPROPion (WELLBUTRIN) 75 MG tablet TAKE 1 TABLET BY MOUTH TWICE A DAY 180 tablet 1  . cefdinir (OMNICEF) 300 MG capsule Take 1 capsule (300 mg total) by mouth 2 (two) times daily. 20 capsule 0   No facility-administered medications prior to visit.     Allergies  Allergen Reactions  . Aripiprazole Other (See Comments)    REACTION: muscle jerks   . Fluoxetine Hcl Other (See Comments)    REACTION: Intolerant  . Nsaids Other (See Comments)    Upper GI bleed   . Paroxetine Other (See Comments)    REACTION: Intolerance   . Ziprasidone Hcl Other (See Comments)    shakes   . Chantix [Varenicline Tartrate] Other (See Comments)    "messing with my mood"  . Metformin And Related Nausea Only  . Montelukast Other (See Comments)    "it kept me sick with an upper respiratory infection"  . Onglyza [Saxagliptin] Nausea Only  . Augmentin [Amoxicillin-Pot Clavulanate] Other (See Comments)    Gets yeast infection every time   . Fluoxetine Hcl     REACTION: Intolerant    ROS     Objective:    Physical Exam  Constitutional: She is oriented to person, place, and time. She appears well-developed and well-nourished.  He is wearing  her oxygen  HENT:  Head: Normocephalic and atraumatic.  Eyes: Conjunctivae and EOM are normal.  Pulmonary/Chest: Effort normal.  Neurological: She is alert and oriented to person, place, and time.  Skin: Skin is dry. No pallor.  Psychiatric: She has a normal mood and affect. Her behavior is normal.  Vitals reviewed.   BP (!) 147/87   Pulse 95   SpO2 94% Comment: 4L Wt Readings from Last 3 Encounters:  08/16/19 252 lb (114.3 kg)  07/22/19 252 lb (114.3 kg)  06/22/19 252 lb (114.3 kg)    Health Maintenance Due  Topic Date Due  . HEMOGLOBIN A1C  02/17/2019  . MAMMOGRAM  04/25/2019  . FOOT EXAM  08/18/2019    There are no preventive care reminders to display for this patient.   Lab Results  Component Value Date   TSH 6.51 (H)  09/06/2018   Lab Results  Component Value Date   WBC 12.5 (H) 09/06/2018   HGB 12.6 09/06/2018   HCT 38.3 09/06/2018   MCV 90.3 09/06/2018   PLT 413 (H) 09/06/2018   Lab Results  Component Value Date   NA 140 09/06/2018   K 4.6 09/06/2018   CO2 30 09/06/2018   GLUCOSE 94 09/06/2018   BUN 11 09/06/2018   CREATININE 1.12 (H) 09/06/2018   BILITOT 0.2 09/06/2018   ALKPHOS 122 08/14/2016   AST 13 09/06/2018   ALT 9 09/06/2018   PROT 6.1 09/06/2018   ALBUMIN 4.0 08/14/2016   CALCIUM 9.5 09/06/2018   Lab Results  Component Value Date   CHOL 127 09/06/2018   Lab Results  Component Value Date   HDL 59 09/06/2018   Lab Results  Component Value Date   LDLCALC 45 09/06/2018   Lab Results  Component Value Date   TRIG 147 09/06/2018   Lab Results  Component Value Date   CHOLHDL 2.2 09/06/2018   Lab Results  Component Value Date   HGBA1C 5.7 (A) 08/17/2018       Assessment & Plan:   Problem List Items Addressed This Visit      Respiratory   COPD exacerbation (HCC) - Primary   Relevant Medications   predniSONE (DELTASONE) 20 MG tablet    Other Visit Diagnoses    Low serum vitamin B12         COPD exacerbation-she does feel about 75% better and I do feel like she is making some good progress.  She is off of all antibiotics.  I did go ahead and refill the prednisone today.  I do feel like she has been on multiple courses in the last several months and I would really like to consider consultation with pulmonology.  Will discuss further at her follow-up in 1 to 2 weeks.  In regards to her B12 deficiency we will recheck her levels when I see her back and make sure that they are adequate with the over-the-counter intake or whether or not she may need to restart injections.  Meds ordered this encounter  Medications  . predniSONE (DELTASONE) 20 MG tablet    Sig: Take 3 tablets (60 mg total) by mouth daily with breakfast for 4 days, THEN 2 tablets (40 mg total) daily  with breakfast for 3 days, THEN 1 tablet (20 mg total) daily with breakfast for 3 days.    Dispense:  21 tablet    Refill:  0     I discussed the assessment and treatment plan with the patient. The patient was provided an  opportunity to ask questions and all were answered. The patient agreed with the plan and demonstrated an understanding of the instructions.   The patient was advised to call back or seek an in-person evaluation if the symptoms worsen or if the condition fails to improve as anticipated.  Nani Gasser, MD

## 2019-09-27 NOTE — Progress Notes (Signed)
Pt reports that she feels better and is coughing up more mucous.   She is still feeling some fatigue.   She left the ED [facility] 09/20/2019.Krystal Burgess, [place]

## 2019-09-28 DIAGNOSIS — J449 Chronic obstructive pulmonary disease, unspecified: Secondary | ICD-10-CM | POA: Diagnosis not present

## 2019-10-03 ENCOUNTER — Ambulatory Visit (INDEPENDENT_AMBULATORY_CARE_PROVIDER_SITE_OTHER): Payer: Medicare HMO | Admitting: Family Medicine

## 2019-10-03 ENCOUNTER — Encounter: Payer: Self-pay | Admitting: Family Medicine

## 2019-10-03 VITALS — BP 133/75 | HR 96 | Temp 97.8°F

## 2019-10-03 DIAGNOSIS — Z23 Encounter for immunization: Secondary | ICD-10-CM | POA: Diagnosis not present

## 2019-10-03 DIAGNOSIS — J9611 Chronic respiratory failure with hypoxia: Secondary | ICD-10-CM

## 2019-10-03 DIAGNOSIS — E519 Thiamine deficiency, unspecified: Secondary | ICD-10-CM

## 2019-10-03 DIAGNOSIS — Z1231 Encounter for screening mammogram for malignant neoplasm of breast: Secondary | ICD-10-CM | POA: Diagnosis not present

## 2019-10-03 DIAGNOSIS — I10 Essential (primary) hypertension: Secondary | ICD-10-CM

## 2019-10-03 DIAGNOSIS — E099 Drug or chemical induced diabetes mellitus without complications: Secondary | ICD-10-CM

## 2019-10-03 DIAGNOSIS — T380X5D Adverse effect of glucocorticoids and synthetic analogues, subsequent encounter: Secondary | ICD-10-CM

## 2019-10-03 DIAGNOSIS — E119 Type 2 diabetes mellitus without complications: Secondary | ICD-10-CM

## 2019-10-03 DIAGNOSIS — R790 Abnormal level of blood mineral: Secondary | ICD-10-CM | POA: Diagnosis not present

## 2019-10-03 DIAGNOSIS — E618 Deficiency of other specified nutrient elements: Secondary | ICD-10-CM | POA: Diagnosis not present

## 2019-10-03 DIAGNOSIS — J441 Chronic obstructive pulmonary disease with (acute) exacerbation: Secondary | ICD-10-CM | POA: Diagnosis not present

## 2019-10-03 DIAGNOSIS — E538 Deficiency of other specified B group vitamins: Secondary | ICD-10-CM

## 2019-10-03 LAB — POCT GLYCOSYLATED HEMOGLOBIN (HGB A1C): Hemoglobin A1C: 5.5 % (ref 4.0–5.6)

## 2019-10-03 MED ORDER — PREDNISONE 5 MG PO TABS: 5.0000 mg | ORAL_TABLET | Freq: Every day | ORAL | 1 refills | Status: DC

## 2019-10-03 MED ORDER — METHYLPREDNISOLONE ACETATE 40 MG/ML IJ SUSP
40.0000 mg | Freq: Once | INTRAMUSCULAR | Status: AC
  Administered 2019-10-03: 40 mg via INTRAMUSCULAR

## 2019-10-03 MED ORDER — AMBULATORY NON FORMULARY MEDICATION: 0 refills | Status: DC

## 2019-10-03 NOTE — Patient Instructions (Signed)
Recommend trying the Premier protein drinks.  I believe they sell a [employer] version that is not even a little cheaper. Recommend you start a good women's One-A-Day multivitamin.

## 2019-10-03 NOTE — Assessment & Plan Note (Signed)
Would like to see if she can get a new portable nebulizer that is battery-powered that she can use in the car etc.

## 2019-10-03 NOTE — Assessment & Plan Note (Addendum)
She still only about 75% better still not quite back to baseline.  We discussed that over time she gets sick and takes a hit it seems to take longer for her to rebound.  I really like to get her back in with pulmonology we had referred her several years ago and then she decided not to continue there.  But at this point I think we need their help.  She is really on maximal therapy and she is Artie on 4 L.  I think she would actually be a great candidate for pulmonary rehab but might have some issues getting to the appointments.  Given 40 mg of IM Depo-Medrol.

## 2019-10-03 NOTE — Assessment & Plan Note (Signed)
Well controlled. Continue current regimen. Follow up in  4-6 mo 

## 2019-10-03 NOTE — Progress Notes (Signed)
Established Patient Office Visit  Subjective:  Patient ID: Krystal Burgess, female    DOB: 01/30/1961  Age: 58 y.o. MRN: [id number]  CC:  Chief Complaint  Patient presents with  . COPD    HPI Krystal Burgess [facility]  She was actually seen in the emergency department on September 28 for COPD exacerbation.  They wanted to admit her but she wanted to go home.  So she was discharged home AGAINST MEDICAL ADVICE.  She was given antibiotic and steroids.  Please see prior phone note.    We did a video visit on October 6 that she was still having some persistent symptoms at that time.  Felt like she still had a lot of congestion in her nose and her throat.  She had recently change the oxygen compressor filter and that seemed to have helped as well.  I did refill her prednisone on the sixth which was approximately 6 days ago.  Diabetes - no hypoglycemic events. No wounds or sores that are not healing well. No increased thirst or urination. Checking glucose at home. Taking medications as prescribed without any side effects.  She is also very worried about a mineral deficiency and would like to have it checked again.  She has had prior history of thiamine deficiency as well as ferritin.  More recently she has been taking B12 and vitamin D but does not take any type of multivitamin.  She admits she really does not eat well and when she does she sometimes will eat just 1 time a day.  Past Medical History:  Diagnosis Date  . Allergy   . Hyperlipidemia   . Pain   . Thyroid disease     Past Surgical History:  Procedure Laterality Date  . ABDOMINAL HYSTERECTOMY    . CESAREAN SECTION    . CHOLECYSTECTOMY    . lipoma removal     RT shoulder  . LUMBAR SPINE SURGERY  2013   Dr. Manson PasseyBrown, L4-5  . pilonydal cyst  1994    Family History  Problem Relation Age of Onset  . Diabetes Brother   . Depression Brother        bipolar  . Cancer Brother        throat   . Diabetes Unknown        grandmother  . Depression Mother   . Hyperlipidemia Mother   . Hypertension Mother   . Pancreatic cancer Mother   . Hypertension Father     Social History   Socioeconomic History  . Marital status: Divorced    Spouse name: Not on file  . Number of children: Not on file  . Years of education: Not on file  . Highest education level: Not on file  Occupational History  . Not on file  Social Needs  . Financial resource strain: Not on file  . Food insecurity    Worry: Not on file    Inability: Not on file  . Transportation needs    Medical: Not on file    Non-medical: Not on file  Tobacco Use  . Smoking status: Current Every Day Smoker    Packs/day: 1.00  . Smokeless tobacco: Never Used  Substance and Sexual Activity  . Alcohol use: Not Currently  . Drug use: No  . Sexual activity: Not on file    Comment: s with mother currentlynemployed, on disabilty, divorced, liver  Lifestyle  . Physical activity    Days per  week: Not on file    Minutes per session: Not on file  . Stress: Not on file  Relationships  . Social Musicianconnections    Talks on phone: Not on file    Gets together: Not on file    Attends religious service: Not on file    Active member of club or organization: Not on file    Attends meetings of clubs or organizations: Not on file    Relationship status: Not on file  . Intimate partner violence    Fear of current or ex partner: Not on file    Emotionally abused: Not on file    Physically abused: Not on file    Forced sexual activity: Not on file  Other Topics Concern  . Not on file  Social History Narrative  . Not on file    Outpatient Medications Prior to Visit  Medication Sig Dispense Refill  . albuterol (VENTOLIN HFA) 108 (90 Base) MCG/ACT inhaler Inhale 2 puffs into the lungs every 4 (four) hours as needed for wheezing or shortness of breath. 18 g 1  . ALPRAZolam (XANAX) 1 MG tablet Take 1 mg by mouth at bedtime as needed.     . AMBULATORY NON FORMULARY MEDICATION Medication Name: ONE TOUCH METER. CHECK BLOOD SUGAR TWICE DAILY. DX:E11.9 1 each 0  . amLODipine (NORVASC) 5 MG tablet Take 1 tablet (5 mg total) by mouth daily. NEEDS LABS 90 tablet 0  . clotrimazole-betamethasone (LOTRISONE) cream Apply 1 application topically 2 (two) times daily. 45 g 1  . diclofenac sodium (VOLTAREN) 1 % GEL APPLE 4G TO AFFECTED AREA UP TO THREE TIMES A DAY  3  . doxepin (SINEQUAN) 10 MG capsule TAKE 1 CAPSULE BY MOUTH EVERYDAY AT BEDTIME 30 capsule 5  . fluticasone (FLONASE) 50 MCG/ACT nasal spray SPRAY 2 SPRAYS INTO EACH NOSTRIL EVERY DAY 48 mL 1  . gabapentin (NEURONTIN) 600 MG tablet Take 600 mg by mouth at bedtime.   6  . glucose blood test strip TEST TWICE A DAY. DX: E11.9 200 each 11  . guaifenesin (HUMIBID E) 400 MG TABS tablet Take 1 tablet (400 mg total) by mouth every 6 (six) hours as needed. 360 tablet PRN  . ipratropium (ATROVENT) 0.06 % nasal spray Place 2 sprays into both nostrils 4 (four) times daily. 45 mL 4  . ipratropium-albuterol (DUONEB) 0.5-2.5 (3) MG/3ML SOLN USE 1 VIAL IN NEBULIZER EVERY 2 TO 3 HOURS 540 mL 11  . JANUVIA 100 MG tablet TAKE 1 TABLET BY MOUTH EVERY DAY 90 tablet 1  . levocetirizine (XYZAL) 5 MG tablet Take 1 tablet (5 mg total) by mouth every evening. 90 tablet 1  . levothyroxine (SYNTHROID, LEVOTHROID) 88 MCG tablet Take 1 tablet (88 mcg total) by mouth daily. 90 tablet 1  . lidocaine (LIDODERM) 5 % APPLY UP TO 3 PATCHES TO AFFECTED AREA DAILY PRN FOR 30 DAYS  3  . losartan (COZAAR) 100 MG tablet TAKE 1 TABLET BY MOUTH EVERY DAY 90 tablet 1  . nystatin (MYCOSTATIN) 100000 UNIT/ML suspension TAKE 1 TEASPOONFUL 4 TIMES A DAY 120 mL 0  . nystatin (MYCOSTATIN/NYSTOP) powder Apply topically 4 (four) times daily. 60 g 1  . ONETOUCH DELICA LANCETS 33G MISC USE TWICE DAILY. DX: E11.9 200 each 11  . oxyCODONE-acetaminophen (PERCOCET) 10-325 MG tablet Take 1 tablet by mouth every 6 (six) hours as needed.  0   . OXYGEN Inhale 4 L into the lungs.    . pantoprazole (PROTONIX) 40 MG tablet  TAKE 1 TABLET BY MOUTH TWICE A DAY 180 tablet 1  . promethazine (PHENERGAN) 25 MG tablet TAKE 1 TABLET BY MOUTH EVERY 8 HOURS AS NEEDED FOR NAUSEA AND VOMITING 25 tablet 2  . risperiDONE (RISPERDAL) 1 MG tablet Take 0.5 mg by mouth 2 (two) times daily.    . rosuvastatin (CRESTOR) 20 MG tablet TAKE 1 TABLET BY MOUTH EVERY DAY 90 tablet 3  . sucralfate (CARAFATE) 1 GM/10ML suspension TAKE 10 MLS (1 G TOTAL) BY MOUTH 4 (FOUR) TIMES DAILY - WITH MEALS AND AT BEDTIME. 420 mL 3  . theophylline (THEODUR) 300 MG 12 hr tablet TAKE 1 TABLET BY MOUTH 2 TIMES A DAY 180 tablet 1  . tiZANidine (ZANAFLEX) 4 MG tablet TAKE 1 TABLET BY MOUTH 2 (TWO) TIMES DAILY AS NEEDED FOR MUSCLE SPASMS. 30 tablet 0  . TRELEGY ELLIPTA 100-62.5-25 MCG/INH AEPB INHALE 1 PUFF DAILY INTO THE LUNGS. 60 each 11  . prednisoLONE 5 MG TABS tablet Take 1 tablet (5 mg total) by mouth daily. 30 tablet 2  . predniSONE (DELTASONE) 20 MG tablet Take 3 tablets (60 mg total) by mouth daily with breakfast for 4 days, THEN 2 tablets (40 mg total) daily with breakfast for 3 days, THEN 1 tablet (20 mg total) daily with breakfast for 3 days. 21 tablet 0   No facility-administered medications prior to visit.     Allergies  Allergen Reactions  . Aripiprazole Other (See Comments)    REACTION: muscle jerks   . Fluoxetine Hcl Other (See Comments)    REACTION: Intolerant  . Nsaids Other (See Comments)    Upper GI bleed   . Paroxetine Other (See Comments)    REACTION: Intolerance   . Ziprasidone Hcl Other (See Comments)    shakes   . Chantix [Varenicline Tartrate] Other (See Comments)    "messing with my mood"  . Metformin And Related Nausea Only  . Montelukast Other (See Comments)    "it kept me sick with an upper respiratory infection"  . Onglyza [Saxagliptin] Nausea Only  . Augmentin [Amoxicillin-Pot Clavulanate] Other (See Comments)    Gets yeast infection  every time   . Fluoxetine Hcl     REACTION: Intolerant    ROS Review of Systems    Objective:    Physical Exam  Constitutional: She is oriented to person, place, and time. She appears well-developed and well-nourished.  HENT:  Head: Normocephalic and atraumatic.  Lip pursing while on oxygen.  Eyes: Conjunctivae are normal.  Cardiovascular: Normal rate, regular rhythm and normal heart sounds.  Pulmonary/Chest: Effort normal. She has wheezes.  Diffuse inspiratory and expiratory wheezing.  No rhonchi.  Neurological: She is alert and oriented to person, place, and time.  Skin: Skin is warm and dry.  Psychiatric: She has a normal mood and affect. Her behavior is normal.    BP 133/75   Pulse 96   Temp 97.8 F (36.6 C) (Oral)   SpO2 94% Comment: 4L oxygen Wt Readings from Last 3 Encounters:  08/16/19 252 lb (114.3 kg)  07/22/19 252 lb (114.3 kg)  06/22/19 252 lb (114.3 kg)     Health Maintenance Due  Topic Date Due  . HEMOGLOBIN A1C  02/17/2019  . MAMMOGRAM  04/25/2019  . FOOT EXAM  08/18/2019    There are no preventive care reminders to display for this patient.  Lab Results  Component Value Date   TSH 6.51 (H) 09/06/2018   Lab Results  Component Value Date   WBC  12.5 (H) 09/06/2018   HGB 12.6 09/06/2018   HCT 38.3 09/06/2018   MCV 90.3 09/06/2018   PLT 413 (H) 09/06/2018   Lab Results  Component Value Date   NA 140 09/06/2018   K 4.6 09/06/2018   CO2 30 09/06/2018   GLUCOSE 94 09/06/2018   BUN 11 09/06/2018   CREATININE 1.12 (H) 09/06/2018   BILITOT 0.2 09/06/2018   ALKPHOS 122 08/14/2016   AST 13 09/06/2018   ALT 9 09/06/2018   PROT 6.1 09/06/2018   ALBUMIN 4.0 08/14/2016   CALCIUM 9.5 09/06/2018   Lab Results  Component Value Date   CHOL 127 09/06/2018   Lab Results  Component Value Date   HDL 59 09/06/2018   Lab Results  Component Value Date   LDLCALC 45 09/06/2018   Lab Results  Component Value Date   TRIG 147 09/06/2018   Lab  Results  Component Value Date   CHOLHDL 2.2 09/06/2018   Lab Results  Component Value Date   HGBA1C 5.5 10/03/2019      Assessment & Plan:   Problem List Items Addressed This Visit      Cardiovascular and Mediastinum   HYPERTENSION, BENIGN    Well controlled. Continue current regimen. Follow up in  4-6 mo.         Respiratory   COPD exacerbation (HCC)    She still only about 75% better still not quite back to baseline.  We discussed that over time she gets sick and takes a hit it seems to take longer for her to rebound.  I really like to get her back in with pulmonology we had referred her several years ago and then she decided not to continue there.  But at this point I think we need their help.  She is really on maximal therapy and she is Artie on 4 L.  I think she would actually be a great candidate for pulmonary rehab but might have some issues getting to the appointments.  Given 40 mg of IM Depo-Medrol.      Relevant Medications   predniSONE (DELTASONE) 5 MG tablet   Other Relevant Orders   Ambulatory referral to Pulmonology   Chronic respiratory failure with hypoxia (HCC)    Would like to see if she can get a new portable nebulizer that is battery-powered that she can use in the car etc.        Endocrine   Steroid-induced diabetes (HCC)    Lab Results  Component Value Date   HGBA1C 5.5 10/03/2019   A1c looks great today at 5.5. F/u in 6 mo      Diabetes type 2, controlled (HCC) - Primary   Relevant Orders   POCT HgB A1C (Completed)    Other Visit Diagnoses    Encounter for screening mammogram for malignant neoplasm of breast       Needs flu shot       Relevant Orders   Flu Vaccine QUAD 6+ mos PF IM (Fluarix Quad PF) (Completed)   B12 deficiency       Relevant Orders   B12   Magnesium   Vitamin B1 deficiency       Relevant Orders   Vitamin B1   Magnesium   Low ferritin       Relevant Orders   Ferritin   Iron   Magnesium   Mineral deficiency        Relevant Orders   Magnesium      Meds ordered  this encounter  Medications  . AMBULATORY NON FORMULARY MEDICATION    Sig: Medication Name: Portable battery-powered nebulizer.Dx: COPD    Dispense:  1 vial    Refill:  0    [facility].  . methylPREDNISolone acetate (DEPO-MEDROL) injection 40 mg  . predniSONE (DELTASONE) 5 MG tablet    Sig: Take 1 tablet (5 mg total) by mouth daily with breakfast.    Dispense:  30 tablet    Refill:  1    Follow-up: Return in about 4 weeks (around 10/31/2019) for COPD.    Beatrice Lecher, MD

## 2019-10-03 NOTE — Assessment & Plan Note (Addendum)
Lab Results  Component Value Date   HGBA1C 5.5 10/03/2019   A1c looks great today at 5.5. F/u in 6 mo

## 2019-10-08 ENCOUNTER — Other Ambulatory Visit: Payer: Self-pay | Admitting: Family Medicine

## 2019-10-08 DIAGNOSIS — K219 Gastro-esophageal reflux disease without esophagitis: Secondary | ICD-10-CM | POA: Diagnosis not present

## 2019-10-08 DIAGNOSIS — J441 Chronic obstructive pulmonary disease with (acute) exacerbation: Secondary | ICD-10-CM | POA: Diagnosis not present

## 2019-10-08 DIAGNOSIS — Z888 Allergy status to other drugs, medicaments and biological substances status: Secondary | ICD-10-CM | POA: Diagnosis not present

## 2019-10-08 DIAGNOSIS — R69 Illness, unspecified: Secondary | ICD-10-CM | POA: Diagnosis not present

## 2019-10-08 DIAGNOSIS — N183 Chronic kidney disease, stage 3 unspecified: Secondary | ICD-10-CM | POA: Diagnosis not present

## 2019-10-08 DIAGNOSIS — R000 Tachycardia, unspecified: Secondary | ICD-10-CM | POA: Diagnosis not present

## 2019-10-08 DIAGNOSIS — Z9981 Dependence on supplemental oxygen: Secondary | ICD-10-CM | POA: Diagnosis not present

## 2019-10-08 DIAGNOSIS — R0602 Shortness of breath: Secondary | ICD-10-CM | POA: Diagnosis not present

## 2019-10-08 DIAGNOSIS — E1122 Type 2 diabetes mellitus with diabetic chronic kidney disease: Secondary | ICD-10-CM | POA: Diagnosis not present

## 2019-10-08 DIAGNOSIS — E039 Hypothyroidism, unspecified: Secondary | ICD-10-CM | POA: Diagnosis not present

## 2019-10-08 DIAGNOSIS — R0603 Acute respiratory distress: Secondary | ICD-10-CM | POA: Diagnosis not present

## 2019-10-08 DIAGNOSIS — R0902 Hypoxemia: Secondary | ICD-10-CM | POA: Diagnosis not present

## 2019-10-08 DIAGNOSIS — E785 Hyperlipidemia, unspecified: Secondary | ICD-10-CM | POA: Diagnosis not present

## 2019-10-08 DIAGNOSIS — R069 Unspecified abnormalities of breathing: Secondary | ICD-10-CM | POA: Diagnosis not present

## 2019-10-08 DIAGNOSIS — I129 Hypertensive chronic kidney disease with stage 1 through stage 4 chronic kidney disease, or unspecified chronic kidney disease: Secondary | ICD-10-CM | POA: Diagnosis not present

## 2019-10-13 ENCOUNTER — Encounter: Payer: Self-pay | Admitting: Family Medicine

## 2019-10-13 ENCOUNTER — Ambulatory Visit (INDEPENDENT_AMBULATORY_CARE_PROVIDER_SITE_OTHER): Payer: Medicare HMO | Admitting: Family Medicine

## 2019-10-13 VITALS — BP 174/103 | HR 105 | Temp 96.6°F | Wt 262.0 lb

## 2019-10-13 DIAGNOSIS — I10 Essential (primary) hypertension: Secondary | ICD-10-CM

## 2019-10-13 DIAGNOSIS — F3132 Bipolar disorder, current episode depressed, moderate: Secondary | ICD-10-CM

## 2019-10-13 DIAGNOSIS — J441 Chronic obstructive pulmonary disease with (acute) exacerbation: Secondary | ICD-10-CM

## 2019-10-13 DIAGNOSIS — R69 Illness, unspecified: Secondary | ICD-10-CM | POA: Diagnosis not present

## 2019-10-13 DIAGNOSIS — J9611 Chronic respiratory failure with hypoxia: Secondary | ICD-10-CM | POA: Diagnosis not present

## 2019-10-13 MED ORDER — AMBULATORY NON FORMULARY MEDICATION: 0 refills | Status: DC

## 2019-10-13 NOTE — Addendum Note (Signed)
Addended by: Beatrice Lecher D on: 10/13/2019 12:38 PM   Modules accepted: Orders

## 2019-10-13 NOTE — Progress Notes (Addendum)
Virtual Visit via Video Note  I connected with Krystal MemsPatricia W Becker on 10/13/19 at 11:10 AM EDT by a video enabled telemedicine application and verified that I am speaking with the correct person using two identifiers.   I discussed the limitations of evaluation and management by telemedicine and the availability of in person appointments. The patient expressed understanding and agreed to proceed.   Established Patient Office Visit  Subjective:  Patient ID: Krystal Burgess, female    DOB: 1961-05-20  Age: 58 y.o. MRN: [id number]  CC:  Chief Complaint  Patient presents with  . Hospitalization Follow-up  . COPD    HPI Krystal Memsatricia W Cozad presents for ED visit f/u for COPD exacerbation.  Last time I saw her she was in the middle of an exacerbation and then a few days later ended up going to the emergency department because she was getting worse.  This is the second time in about 3 weeks that she has been to the ED for her COPD.  I went ahead and referred her to pulmonology a couple of weeks ago to see Dr. [place]re [place]id [place].  Patient preferred to stay local so that she could easily get to appointments.  She is not driving much anymore.  When she went to the ED her PCO2 was 60.  And her PO2 venous was 28.  She had already increased her oxygen to 5 L.  She is also given 60 mg of prednisone daily for 5 days.  Says she was also told that her Risperdal could be causing her symptoms and so this was discontinued and she was given Cymbalta to take at bedtime.  She says since being home and being switched off the Risperdal she actually is feeling better she still in the 60 mg of prednisone daily.  When we last talked we have talked about maybe getting a flutter valve unfortunately they are actually quite expensive.  She actually found her mother's old flutter valve.  Past Medical History:  Diagnosis Date  . Allergy   . Hyperlipidemia   . Pain   . Thyroid disease     Past Surgical History:   Procedure Laterality Date  . ABDOMINAL HYSTERECTOMY    . CESAREAN SECTION    . CHOLECYSTECTOMY    . lipoma removal     RT shoulder  . LUMBAR SPINE SURGERY  2013   Dr. Manson PasseyBrown, L4-5  . pilonydal cyst  1994    Family History  Problem Relation Age of Onset  . Diabetes Brother   . Depression Brother        bipolar  . Cancer Brother        throat  . Diabetes Unknown        grandmother  . Depression Mother   . Hyperlipidemia Mother   . Hypertension Mother   . Pancreatic cancer Mother   . Hypertension Father     Social History   Socioeconomic History  . Marital status: Divorced    Spouse name: Not on file  . Number of children: Not on file  . Years of education: Not on file  . Highest education level: Not on file  Occupational History  . Not on file  Social Needs  . Financial resource strain: Not on file  . Food insecurity    Worry: Not on file    Inability: Not on file  . Transportation needs    Medical: Not on file    Non-medical: Not on file  Tobacco  Use  . Smoking status: Current Every Day Smoker    Packs/day: 1.00  . Smokeless tobacco: Never Used  Substance and Sexual Activity  . Alcohol use: Not Currently  . Drug use: No  . Sexual activity: Not on file    Comment: s with mother currentlynemployed, on disabilty, divorced, liver  Lifestyle  . Physical activity    Days per week: Not on file    Minutes per session: Not on file  . Stress: Not on file  Relationships  . Social Musician on phone: Not on file    Gets together: Not on file    Attends religious service: Not on file    Active member of club or organization: Not on file    Attends meetings of clubs or organizations: Not on file    Relationship status: Not on file  . Intimate partner violence    Fear of current or ex partner: Not on file    Emotionally abused: Not on file    Physically abused: Not on file    Forced sexual activity: Not on file  Other Topics Concern  . Not on file   Social History Narrative  . Not on file    Outpatient Medications Prior to Visit  Medication Sig Dispense Refill  . albuterol (VENTOLIN HFA) 108 (90 Base) MCG/ACT inhaler Inhale 2 puffs into the lungs every 4 (four) hours as needed for wheezing or shortness of breath. 18 g 1  . ALPRAZolam (XANAX) 1 MG tablet Take 1 mg by mouth at bedtime as needed.    . AMBULATORY NON FORMULARY MEDICATION Medication Name: ONE TOUCH METER. CHECK BLOOD SUGAR TWICE DAILY. DX:E11.9 1 each 0  . AMBULATORY NON FORMULARY MEDICATION Medication Name: Portable battery-powered nebulizer.Dx: COPD 1 vial 0  . amLODipine (NORVASC) 5 MG tablet Take 1 tablet (5 mg total) by mouth daily. NEEDS LABS 90 tablet 0  . clotrimazole-betamethasone (LOTRISONE) cream Apply 1 application topically 2 (two) times daily. 45 g 1  . diclofenac sodium (VOLTAREN) 1 % GEL APPLE 4G TO AFFECTED AREA UP TO THREE TIMES A DAY  3  . doxepin (SINEQUAN) 10 MG capsule TAKE 1 CAPSULE BY MOUTH EVERYDAY AT BEDTIME 30 capsule 5  . DULoxetine (CYMBALTA) 30 MG capsule Take 30 mg by mouth daily. Every night before bed    . fluticasone (FLONASE) 50 MCG/ACT nasal spray SPRAY 2 SPRAYS INTO EACH NOSTRIL EVERY DAY 48 mL 1  . gabapentin (NEURONTIN) 600 MG tablet Take 600 mg by mouth at bedtime.   6  . glucose blood test strip TEST TWICE A DAY. DX: E11.9 200 each 11  . guaifenesin (HUMIBID E) 400 MG TABS tablet Take 1 tablet (400 mg total) by mouth every 6 (six) hours as needed. 360 tablet PRN  . ipratropium (ATROVENT) 0.06 % nasal spray Place 2 sprays into both nostrils 4 (four) times daily. 45 mL 4  . ipratropium-albuterol (DUONEB) 0.5-2.5 (3) MG/3ML SOLN TAKE 3 MLS BY NEBULIZATION EVERY 2 (TWO) HOURS WHILE AWAKE. 540 mL 11  . JANUVIA 100 MG tablet TAKE 1 TABLET BY MOUTH EVERY DAY 90 tablet 1  . levocetirizine (XYZAL) 5 MG tablet Take 1 tablet (5 mg total) by mouth every evening. 90 tablet 1  . levothyroxine (SYNTHROID, LEVOTHROID) 88 MCG tablet Take 1 tablet (88  mcg total) by mouth daily. 90 tablet 1  . lidocaine (LIDODERM) 5 % APPLY UP TO 3 PATCHES TO AFFECTED AREA DAILY PRN FOR 30 DAYS  3  .  losartan (COZAAR) 100 MG tablet TAKE 1 TABLET BY MOUTH EVERY DAY 90 tablet 1  . nystatin (MYCOSTATIN) 100000 UNIT/ML suspension TAKE 1 TEASPOONFUL 4 TIMES A DAY 120 mL 0  . nystatin (MYCOSTATIN/NYSTOP) powder Apply topically 4 (four) times daily. 60 g 1  . ONETOUCH DELICA LANCETS 33G MISC USE TWICE DAILY. DX: E11.9 200 each 11  . oxyCODONE-acetaminophen (PERCOCET) 10-325 MG tablet Take 1 tablet by mouth every 6 (six) hours as needed.  0  . OXYGEN Inhale 4 L into the lungs.    . pantoprazole (PROTONIX) 40 MG tablet TAKE 1 TABLET BY MOUTH TWICE A DAY 180 tablet 1  . predniSONE (DELTASONE) 5 MG tablet Take 1 tablet (5 mg total) by mouth daily with breakfast. 30 tablet 1  . promethazine (PHENERGAN) 25 MG tablet TAKE 1 TABLET BY MOUTH EVERY 8 HOURS AS NEEDED FOR NAUSEA AND VOMITING 25 tablet 2  . rosuvastatin (CRESTOR) 20 MG tablet TAKE 1 TABLET BY MOUTH EVERY DAY 90 tablet 3  . sucralfate (CARAFATE) 1 GM/10ML suspension TAKE 10 MLS (1 G TOTAL) BY MOUTH 4 (FOUR) TIMES DAILY - WITH MEALS AND AT BEDTIME. 420 mL 3  . theophylline (THEODUR) 300 MG 12 hr tablet TAKE 1 TABLET BY MOUTH 2 TIMES A DAY 180 tablet 1  . tiZANidine (ZANAFLEX) 4 MG tablet TAKE 1 TABLET BY MOUTH 2 (TWO) TIMES DAILY AS NEEDED FOR MUSCLE SPASMS. 30 tablet 0  . TRELEGY ELLIPTA 100-62.5-25 MCG/INH AEPB INHALE 1 PUFF DAILY INTO THE LUNGS. 60 each 11  . risperiDONE (RISPERDAL) 1 MG tablet Take 0.5 mg by mouth 2 (two) times daily.     No facility-administered medications prior to visit.     Allergies  Allergen Reactions  . Risperidone And Related Shortness Of Breath  . Aripiprazole Other (See Comments)    REACTION: muscle jerks   . Fluoxetine Hcl Other (See Comments)    REACTION: Intolerant  . Nsaids Other (See Comments)    Upper GI bleed   . Paroxetine Other (See Comments)    REACTION:  Intolerance   . Ziprasidone Hcl Other (See Comments)    shakes   . Chantix [Varenicline Tartrate] Other (See Comments)    "messing with my mood"  . Metformin And Related Nausea Only  . Montelukast Other (See Comments)    "it kept me sick with an upper respiratory infection"  . Onglyza [Saxagliptin] Nausea Only  . Augmentin [Amoxicillin-Pot Clavulanate] Other (See Comments)    Gets yeast infection every time   . Fluoxetine Hcl     REACTION: Intolerant    ROS Review of Systems    Objective:    Physical Exam  Constitutional: She is oriented to person, place, and time. She appears well-developed and well-nourished.  HENT:  Head: Normocephalic and atraumatic.  Eyes: Conjunctivae and EOM are normal.  Pulmonary/Chest: Effort normal.  Neurological: She is alert and oriented to person, place, and time.  Skin: Skin is dry. No pallor.  Psychiatric: She has a normal mood and affect. Her behavior is normal.  Vitals reviewed.   BP (!) 174/103   Pulse (!) 105   Temp (!) 96.6 F (35.9 C) (Oral)   Wt 262 lb (118.8 kg)   SpO2 95% Comment: 5L  BMI 44.97 kg/m  Wt Readings from Last 3 Encounters:  10/13/19 262 lb (118.8 kg)  08/16/19 252 lb (114.3 kg)  07/22/19 252 lb (114.3 kg)     Health Maintenance Due  Topic Date Due  . MAMMOGRAM  04/25/2019    There are no preventive care reminders to display for this patient.  Lab Results  Component Value Date   TSH 6.51 (H) 09/06/2018   Lab Results  Component Value Date   WBC 12.5 (H) 09/06/2018   HGB 12.6 09/06/2018   HCT 38.3 09/06/2018   MCV 90.3 09/06/2018   PLT 413 (H) 09/06/2018   Lab Results  Component Value Date   NA 140 09/06/2018   K 4.6 09/06/2018   CO2 30 09/06/2018   GLUCOSE 94 09/06/2018   BUN 11 09/06/2018   CREATININE 1.12 (H) 09/06/2018   BILITOT 0.2 09/06/2018   ALKPHOS 122 08/14/2016   AST 13 09/06/2018   ALT 9 09/06/2018   PROT 6.1 09/06/2018   ALBUMIN 4.0 08/14/2016   CALCIUM 9.5 09/06/2018    Lab Results  Component Value Date   CHOL 127 09/06/2018   Lab Results  Component Value Date   HDL 59 09/06/2018   Lab Results  Component Value Date   LDLCALC 45 09/06/2018   Lab Results  Component Value Date   TRIG 147 09/06/2018   Lab Results  Component Value Date   CHOLHDL 2.2 09/06/2018   Lab Results  Component Value Date   HGBA1C 5.5 10/03/2019      Assessment & Plan:   Problem List Items Addressed This Visit      Cardiovascular and Mediastinum   HYPERTENSION, BENIGN    Blood pressure is up significantly today is normally well controlled suspect may be due to high doses of prednisone.  We will keep an eye on this over the next couple of weeks and make sure that it is trending downward.        Respiratory   COPD exacerbation (HCC) - Primary   Chronic respiratory failure with hypoxia (HCC)    She is back home and doing a little better overall she still on 5 L that is not normally her baseline she usually is on 3 L.  Hopefully it was the Risperdal exacerbating her symptoms some very hopeful that in making that change she will get significantly better.  She wants us to work on getting her an oxygen concentrator.  We actually sent a prescription for this when she was here last time but evidently she said she never heard back about it and she did not qualify for the nebulizer yet because it had not been a full 2 years.  She also does not qualify for facemask because she does not wear 6 L or higher so Medicare will not cover that.  Did encourage her to use the flutter valve that she found.  To go on YouTube there is some great demonstration videos on how to properly use it she tends to get a lot of chest congestion some hoping that this will actually help her move out some of the congestion.        Other   Bipolar disorder (HCC)    Off of Risperdal and now just on Cymbalta.  Hopefully will do well on this new regimen         Meds ordered this encounter   Medications  . AMBULATORY NON FORMULARY MEDICATION    Sig: Medication Name: Portable oxygen concentrator with tubing and supplies.  Fax to [facility].  Dx Severe COPD    Dispense:  1 vial    Refill:  0    Follow-up: Return for Keep regular follow-up..    I discussed the assessment and treatment plan with  the patient. The patient was provided an opportunity to ask questions and all were answered. The patient agreed with the plan and demonstrated an understanding of the instructions.   The patient was advised to call back or seek an in-person evaluation if the symptoms worsen or if the condition fails to improve as anticipated.  Beatrice Lecher, MD

## 2019-10-13 NOTE — Assessment & Plan Note (Signed)
She is back home and doing a little better overall she still on 5 L that is not normally her baseline she usually is on 3 L.  Hopefully it was the Risperdal exacerbating her symptoms some very hopeful that in making that change she will get significantly better.  She wants [place] to work on getting her an oxygen concentrator.  We actually sent a prescription for this when she was here last time but evidently she said she never heard back about it and she did not qualify for the nebulizer yet because it had not been a full 2 years.  She also does not qualify for facemask because she does not wear 6 L or higher so Medicare will not cover that.  Did encourage her to use the flutter valve that she found.  To go on YouTube there is some great demonstration videos on how to properly use it she tends to get a lot of chest congestion some hoping that this will actually help her move out some of the congestion.

## 2019-10-13 NOTE — Assessment & Plan Note (Signed)
Off of Risperdal and now just on Cymbalta.  Hopefully will do well on this new regimen

## 2019-10-13 NOTE — Assessment & Plan Note (Signed)
Blood pressure is up significantly today is normally well controlled suspect may be due to high doses of prednisone.  We will keep an eye on this over the next couple of weeks and make sure that it is trending downward.

## 2019-10-16 ENCOUNTER — Other Ambulatory Visit: Payer: Self-pay | Admitting: Family Medicine

## 2019-10-17 ENCOUNTER — Telehealth: Payer: Self-pay

## 2019-10-17 NOTE — Telephone Encounter (Signed)
I contacted Charmian Muff with [facility] regarding this patient's order for portable oxygen concentrator and this is what I found out:  " Garnette Scheuermann, CMA        Pt needs to have been on oxygen for less than 6 months in order for [place] to get approval to purchase a POC. They're very expensive, so our corporate office will not approve them on any patient who has already been on O2 for more than 6 months.   Previous Messages  ----- Message -----  From: Towana Badger, CMA  Sent: 10/17/2019  8:18 AM EDT  To: Charmian Muff  Subject: FW: Portable Oxygen Concentrator         Thanks for getting back to me on this. What are the requirements for patients to be approved for this?  ----- Message -----  From: Charmian Muff  Sent: 10/14/2019  5:46 PM EDT  To: Towana Badger, CMA  Subject: RE: Portable Oxygen Concentrator         We have the order, but this pt is not eligible for a POC as she has been on O2 for over a year with [place]. Our corporate office will not approve it.      Please advise on next steps

## 2019-10-17 NOTE — Telephone Encounter (Signed)
Note sent to Krystal Burgess inquiring about servicing current machine

## 2019-10-17 NOTE — Telephone Encounter (Signed)
She already has one it is just getting worn out.  Would they be able to service her old one?

## 2019-10-19 NOTE — Telephone Encounter (Signed)
Called and gave patient the three option listed. Patient was not happy. States because she owes [employer] so much money, they will not service her machine until she makes payments. Patient states she is just going to call [place] herself tomorrow and see if they will get her a new machine.   I ADVISED PATIENT THAT PER JAMES CAIN IF SHE SWITCHES TO [facility] SHE CANNOT BE RE-ACCEPTED AT [place]. PATIENT STATES SHE UNDERSTANDS AND IS AGREEABLE.

## 2019-10-19 NOTE — Telephone Encounter (Signed)
Spoke with Krystal Burgess concerning this situation.   Per Krystal Burgess, patient started services with [facility] 2015 then transferred to [facility] in 2019. Patient kept her machine from [place] and since she is no longer with [employer], [facility] cannot service nor replace this machine.   In order to receive a new POC, patient will have return current to [place], pay difference, and get it serviced by them.   1. Send POC herself to have repaired and pay ourt of pocket   2. Take to [facility], have them send and pay OOP  3. She can wait until 2024 to restart her 5 years and can get a new POC. (currently 17 months in)

## 2019-10-22 DIAGNOSIS — K219 Gastro-esophageal reflux disease without esophagitis: Secondary | ICD-10-CM | POA: Diagnosis not present

## 2019-10-22 DIAGNOSIS — E785 Hyperlipidemia, unspecified: Secondary | ICD-10-CM | POA: Diagnosis not present

## 2019-10-22 DIAGNOSIS — J449 Chronic obstructive pulmonary disease, unspecified: Secondary | ICD-10-CM | POA: Diagnosis not present

## 2019-10-22 DIAGNOSIS — E039 Hypothyroidism, unspecified: Secondary | ICD-10-CM | POA: Diagnosis not present

## 2019-10-22 DIAGNOSIS — R000 Tachycardia, unspecified: Secondary | ICD-10-CM | POA: Diagnosis not present

## 2019-10-22 DIAGNOSIS — R069 Unspecified abnormalities of breathing: Secondary | ICD-10-CM | POA: Diagnosis not present

## 2019-10-22 DIAGNOSIS — E1122 Type 2 diabetes mellitus with diabetic chronic kidney disease: Secondary | ICD-10-CM | POA: Diagnosis not present

## 2019-10-22 DIAGNOSIS — N183 Chronic kidney disease, stage 3 unspecified: Secondary | ICD-10-CM | POA: Diagnosis not present

## 2019-10-22 DIAGNOSIS — R112 Nausea with vomiting, unspecified: Secondary | ICD-10-CM | POA: Diagnosis not present

## 2019-10-22 DIAGNOSIS — R0602 Shortness of breath: Secondary | ICD-10-CM | POA: Diagnosis not present

## 2019-10-22 DIAGNOSIS — R69 Illness, unspecified: Secondary | ICD-10-CM | POA: Diagnosis not present

## 2019-10-22 DIAGNOSIS — R110 Nausea: Secondary | ICD-10-CM | POA: Diagnosis not present

## 2019-10-22 DIAGNOSIS — R197 Diarrhea, unspecified: Secondary | ICD-10-CM | POA: Diagnosis not present

## 2019-10-22 DIAGNOSIS — Z9049 Acquired absence of other specified parts of digestive tract: Secondary | ICD-10-CM | POA: Diagnosis not present

## 2019-10-22 DIAGNOSIS — I129 Hypertensive chronic kidney disease with stage 1 through stage 4 chronic kidney disease, or unspecified chronic kidney disease: Secondary | ICD-10-CM | POA: Diagnosis not present

## 2019-10-22 DIAGNOSIS — R509 Fever, unspecified: Secondary | ICD-10-CM | POA: Diagnosis not present

## 2019-10-25 ENCOUNTER — Other Ambulatory Visit: Payer: Self-pay | Admitting: Family Medicine

## 2019-10-25 NOTE — Telephone Encounter (Signed)
Left pt msg to call with update on what she has decided to do

## 2019-10-26 DIAGNOSIS — G5603 Carpal tunnel syndrome, bilateral upper limbs: Secondary | ICD-10-CM | POA: Diagnosis not present

## 2019-10-26 DIAGNOSIS — M5441 Lumbago with sciatica, right side: Secondary | ICD-10-CM | POA: Diagnosis not present

## 2019-10-26 DIAGNOSIS — M5442 Lumbago with sciatica, left side: Secondary | ICD-10-CM | POA: Diagnosis not present

## 2019-10-26 DIAGNOSIS — M5412 Radiculopathy, cervical region: Secondary | ICD-10-CM | POA: Diagnosis not present

## 2019-10-28 ENCOUNTER — Telehealth: Payer: Self-pay

## 2019-10-28 NOTE — Telephone Encounter (Signed)
Patient left msg stating she reported to ER on Saturday because of stomach bug. Patient having some nausea and ran out of Zofran. Requesting RF.

## 2019-10-29 DIAGNOSIS — J449 Chronic obstructive pulmonary disease, unspecified: Secondary | ICD-10-CM | POA: Diagnosis not present

## 2019-10-31 MED ORDER — ONDANSETRON 4 MG PO TBDP: 4.0000 mg | ORAL_TABLET | Freq: Three times a day (TID) | ORAL | 0 refills | Status: DC | PRN

## 2019-10-31 NOTE — Telephone Encounter (Signed)
Zofran sent to pharmacy

## 2019-11-03 NOTE — Telephone Encounter (Signed)
Left pt msg advising RX at pharmacy  

## 2019-11-10 ENCOUNTER — Encounter: Payer: Self-pay | Admitting: Family Medicine

## 2019-11-10 ENCOUNTER — Ambulatory Visit (INDEPENDENT_AMBULATORY_CARE_PROVIDER_SITE_OTHER): Payer: Medicare HMO | Admitting: Family Medicine

## 2019-11-10 ENCOUNTER — Telehealth: Payer: Self-pay

## 2019-11-10 VITALS — BP 130/94

## 2019-11-10 DIAGNOSIS — R531 Weakness: Secondary | ICD-10-CM | POA: Diagnosis not present

## 2019-11-10 DIAGNOSIS — J9611 Chronic respiratory failure with hypoxia: Secondary | ICD-10-CM

## 2019-11-10 DIAGNOSIS — R197 Diarrhea, unspecified: Secondary | ICD-10-CM | POA: Diagnosis not present

## 2019-11-10 DIAGNOSIS — E1139 Type 2 diabetes mellitus with other diabetic ophthalmic complication: Secondary | ICD-10-CM | POA: Diagnosis not present

## 2019-11-10 DIAGNOSIS — N183 Chronic kidney disease, stage 3 unspecified: Secondary | ICD-10-CM | POA: Diagnosis not present

## 2019-11-10 DIAGNOSIS — F17210 Nicotine dependence, cigarettes, uncomplicated: Secondary | ICD-10-CM | POA: Diagnosis not present

## 2019-11-10 DIAGNOSIS — R69 Illness, unspecified: Secondary | ICD-10-CM | POA: Diagnosis not present

## 2019-11-10 DIAGNOSIS — R112 Nausea with vomiting, unspecified: Secondary | ICD-10-CM | POA: Diagnosis not present

## 2019-11-10 DIAGNOSIS — E1122 Type 2 diabetes mellitus with diabetic chronic kidney disease: Secondary | ICD-10-CM | POA: Diagnosis not present

## 2019-11-10 DIAGNOSIS — R110 Nausea: Secondary | ICD-10-CM | POA: Diagnosis not present

## 2019-11-10 DIAGNOSIS — J449 Chronic obstructive pulmonary disease, unspecified: Secondary | ICD-10-CM | POA: Diagnosis not present

## 2019-11-10 DIAGNOSIS — J441 Chronic obstructive pulmonary disease with (acute) exacerbation: Secondary | ICD-10-CM

## 2019-11-10 DIAGNOSIS — R109 Unspecified abdominal pain: Secondary | ICD-10-CM | POA: Diagnosis not present

## 2019-11-10 DIAGNOSIS — E1142 Type 2 diabetes mellitus with diabetic polyneuropathy: Secondary | ICD-10-CM | POA: Diagnosis not present

## 2019-11-10 DIAGNOSIS — I129 Hypertensive chronic kidney disease with stage 1 through stage 4 chronic kidney disease, or unspecified chronic kidney disease: Secondary | ICD-10-CM | POA: Diagnosis not present

## 2019-11-10 DIAGNOSIS — K529 Noninfective gastroenteritis and colitis, unspecified: Secondary | ICD-10-CM | POA: Diagnosis not present

## 2019-11-10 DIAGNOSIS — E876 Hypokalemia: Secondary | ICD-10-CM | POA: Diagnosis not present

## 2019-11-10 MED ORDER — ONDANSETRON 4 MG PO TBDP: 4.0000 mg | ORAL_TABLET | Freq: Three times a day (TID) | ORAL | 0 refills | Status: DC | PRN

## 2019-11-10 MED ORDER — AMBULATORY NON FORMULARY MEDICATION: 0 refills | Status: DC

## 2019-11-10 NOTE — Progress Notes (Signed)
Virtual Visit via Video Note  I connected with Krystal Burgess on 11/10/19 at 10:10 AM EST by a video enabled telemedicine application and verified that I am speaking with the correct person using two identifiers.   I discussed the limitations of evaluation and management by telemedicine and the availability of in person appointments. The patient expressed understanding and agreed to proceed.  Subjective:    CC: Diarrhea  HPI:  Pt reports that her sxs began about 4 days before Halloween she was seen in the ED on halloween. She reports that the diarrhea has been "slimey" the entire time that she has had it. The last episode was last night. She said that it comes and goes and that it is now to the point that its not a lot just small amount and she has to wipe more. She said that about 1 week ago it was explosive.  Now she is just passing smaller amounts but it still liquidy diarrhea.  She denies any blood.  She has not been able to check her temperature but says at night she has been having some chills which is not like her.  She denies any abdominal cramping or severe abdominal pain.  Is mostly just stool urgency.  Nobody else in the house has been sick.  She is just been extremely nauseated to the point that she has been taking Phenergan and Zofran and in fact needs a refill on the Zofran she is about out.  She says when she went to the ED she was told that she was a little dehydrated.  Did do a CT of the abdomen and pelvis and it was essentially normal.  BC showed her white count was mildly elevated at 14.9.  But she had also been on prednisone and recent antibiotics.  She is actually been on multiple courses of antibiotics for back-to-back COPD exacerbations in fact she is also been to the ED a couple times for that.   Past medical history, Surgical history, Family history not pertinant except as noted below, Social history, Allergies, and medications have been entered into the medical record,  reviewed, and corrections made.   Review of Systems: No fevers, chills, night sweats, weight loss, chest pain, or shortness of breath.   Objective:    General: Speaking clearly in complete sentences without any shortness of breath.  Alert and oriented x3.  Normal judgment. No apparent acute distress. She is wearing her oxygen.     Impression and Recommendations:    Diarrhea, persistent x3 weeks-at this point I am highly suspicious of the possibility for C. difficile infection especially with multiple courses of antibiotics in a very short period of time.  We will check for C. difficile as well as do a stool culture.  We will also check liver enzymes to rule out hepatitis.  We will also repeat CBC with differential to make sure that her white cell count is not increasing.  Encouraged her to really push her fluids.  We will add and refill her Zofran.  If she gets worse then please go to emergency department.      I discussed the assessment and treatment plan with the patient. The patient was provided an opportunity to ask questions and all were answered. The patient agreed with the plan and demonstrated an understanding of the instructions.   The patient was advised to call back or seek an in-person evaluation if the symptoms worsen or if the condition fails to improve as anticipated.  Beatrice Lecher, MD

## 2019-11-10 NOTE — Telephone Encounter (Signed)
Patient called, states that [facility] came and picked up her portable oxygen concentrator, so now she does not have one.   Patient requesting order for new one sent to [facility] in [place].. [facility] is now [facility] and is not located in [place].   Kenney Houseman is speaking with pt today, she will confirm for me where this information (RX, office note, snapshot, and insurance info) needs to be faxed

## 2019-11-10 NOTE — Telephone Encounter (Signed)
Patient information faxed to this location, retrieved from [facility] [place]  [address] [place], [place] [address]   [place]  Fax: [phone]

## 2019-11-10 NOTE — Addendum Note (Signed)
Addended by: Beatrice Lecher D on: 11/10/2019 10:43 AM   Modules accepted: Orders

## 2019-11-10 NOTE — Progress Notes (Signed)
Pt reports that her sxs began about 4 days before Halloween she was seen in the ED on halloween. She reports that the diarrhea has been "slimey" the entire time that she has had it. The last episode was last night. She said that it comes and goes and that it is now to the point that its not a lot just small amount and she has to wipe more. She said that about 1 week ago it was explosive.   She also reports that she is now having dry heaves.

## 2019-11-15 ENCOUNTER — Telehealth: Payer: Self-pay | Admitting: Family Medicine

## 2019-11-15 MED ORDER — PROMETHAZINE HCL 25 MG PO TABS: 25.0000 mg | ORAL_TABLET | Freq: Three times a day (TID) | ORAL | 1 refills | Status: DC | PRN

## 2019-11-15 MED ORDER — ONDANSETRON 4 MG PO TBDP: 4.0000 mg | ORAL_TABLET | Freq: Three times a day (TID) | ORAL | 0 refills | Status: DC | PRN

## 2019-11-15 NOTE — Telephone Encounter (Signed)
I did go ahead and refill the medicine but it does not look like she is actually dropped off a stool sample.  She really needs to have the work-up done.

## 2019-11-15 NOTE — Telephone Encounter (Signed)
Patient daughter called and states that her mom is still feeling nauseated but no other symptoms. She had a visit with you on 11/10/2019. Is it ok to fill the Promethazine and Zofran for this patient? Please advise.

## 2019-11-16 NOTE — Telephone Encounter (Signed)
Called and advised patient. States she went back to ER on Thursday night after her appointment and potassium and magnesium were critically low. Was given 5 day RX for both.   Daughter is coming today to get stool sample kit and patient is going to do blood-work after holidays.  States she is too weak to come in today, she is only eating about every 2 weeks.

## 2019-11-28 DIAGNOSIS — J449 Chronic obstructive pulmonary disease, unspecified: Secondary | ICD-10-CM | POA: Diagnosis not present

## 2019-12-09 ENCOUNTER — Other Ambulatory Visit: Payer: Self-pay | Admitting: Family Medicine

## 2019-12-14 DIAGNOSIS — R197 Diarrhea, unspecified: Secondary | ICD-10-CM | POA: Diagnosis not present

## 2019-12-18 LAB — CBC WITH DIFFERENTIAL/PLATELET
Absolute Monocytes: 1190 cells/uL — ABNORMAL HIGH (ref 200–950)
Basophils Absolute: 74 cells/uL (ref 0–200)
Basophils Relative: 0.6 %
Eosinophils Absolute: 285 cells/uL (ref 15–500)
Eosinophils Relative: 2.3 %
HCT: 39 % (ref 35.0–45.0)
Hemoglobin: 12.7 g/dL (ref 11.7–15.5)
Lymphs Abs: 2902 cells/uL (ref 850–3900)
MCH: 29.5 pg (ref 27.0–33.0)
MCHC: 32.6 g/dL (ref 32.0–36.0)
MCV: 90.7 fL (ref 80.0–100.0)
MPV: 10.9 fL (ref 7.5–12.5)
Monocytes Relative: 9.6 %
Neutro Abs: 7948 cells/uL — ABNORMAL HIGH (ref 1500–7800)
Neutrophils Relative %: 64.1 %
Platelets: 302 10*3/uL (ref 140–400)
RBC: 4.3 10*6/uL (ref 3.80–5.10)
RDW: 12.5 % (ref 11.0–15.0)
Total Lymphocyte: 23.4 %
WBC: 12.4 10*3/uL — ABNORMAL HIGH (ref 3.8–10.8)

## 2019-12-18 LAB — CLOSTRIDIUM DIFFICILE TOXIN B, QUALITATIVE, REAL-TIME PCR: Toxigenic C. Difficile by PCR: NOT DETECTED

## 2019-12-18 LAB — COMPLETE METABOLIC PANEL WITH GFR
AG Ratio: 1.7 (calc) (ref 1.0–2.5)
ALT: 11 U/L (ref 6–29)
AST: 17 U/L (ref 10–35)
Albumin: 3.8 g/dL (ref 3.6–5.1)
Alkaline phosphatase (APISO): 96 U/L (ref 37–153)
BUN/Creatinine Ratio: 8 (calc) (ref 6–22)
BUN: 9 mg/dL (ref 7–25)
CO2: 25 mmol/L (ref 20–32)
Calcium: 9 mg/dL (ref 8.6–10.4)
Chloride: 102 mmol/L (ref 98–110)
Creat: 1.11 mg/dL — ABNORMAL HIGH (ref 0.50–1.05)
GFR, Est African American: 64 mL/min/{1.73_m2} (ref >=60)
GFR, Est Non African American: 55 mL/min/{1.73_m2} — ABNORMAL LOW (ref >=60)
Globulin: 2.3 g/dL (calc) (ref 1.9–3.7)
Glucose, Bld: 105 mg/dL — ABNORMAL HIGH (ref 65–99)
Potassium: 3.3 mmol/L — ABNORMAL LOW (ref 3.5–5.3)
Sodium: 143 mmol/L (ref 135–146)
Total Bilirubin: 0.3 mg/dL (ref 0.2–1.2)
Total Protein: 6.1 g/dL (ref 6.1–8.1)

## 2019-12-18 LAB — STOOL CULTURE
MICRO NUMBER:: 1228297
MICRO NUMBER:: 1228298
MICRO NUMBER:: 1228299
SHIGA RESULT:: NOT DETECTED
SPECIMEN QUALITY:: ADEQUATE
SPECIMEN QUALITY:: ADEQUATE
SPECIMEN QUALITY:: ADEQUATE

## 2019-12-29 ENCOUNTER — Other Ambulatory Visit: Payer: Self-pay | Admitting: Family Medicine

## 2019-12-29 DIAGNOSIS — J449 Chronic obstructive pulmonary disease, unspecified: Secondary | ICD-10-CM | POA: Diagnosis not present

## 2020-01-08 ENCOUNTER — Other Ambulatory Visit: Payer: Self-pay | Admitting: Physician Assistant

## 2020-01-18 ENCOUNTER — Other Ambulatory Visit: Payer: Self-pay | Admitting: Family Medicine

## 2020-01-28 DIAGNOSIS — R69 Illness, unspecified: Secondary | ICD-10-CM | POA: Diagnosis not present

## 2020-01-29 DIAGNOSIS — J449 Chronic obstructive pulmonary disease, unspecified: Secondary | ICD-10-CM | POA: Diagnosis not present

## 2020-02-01 ENCOUNTER — Other Ambulatory Visit: Payer: Self-pay | Admitting: Family Medicine

## 2020-02-09 ENCOUNTER — Other Ambulatory Visit: Payer: Self-pay | Admitting: Osteopathic Medicine

## 2020-02-09 NOTE — Telephone Encounter (Signed)
Notes to clinic:  This Rx is not able to be delegated.    Requested Prescriptions  Pending Prescriptions Disp Refills   predniSONE (DELTASONE) 5 MG tablet [Pharmacy Med Name: PREDNISONE 5 MG TABLET] 30 tablet 1    Sig: TAKE 1 TABLET BY MOUTH EVERY DAY WITH BREAKFAST      Not Delegated - Endocrinology:  Oral Corticosteroids Failed - 02/09/2020  1:36 AM      Failed - This refill cannot be delegated      Failed - Last BP in normal range    BP Readings from Last 1 Encounters:  11/10/19 (!) 130/94          Passed - Valid encounter within last 6 months    Recent Outpatient Visits           3 months ago Diarrhea, unspecified type   [facility], Barbarann Ehlers, MD   3 months ago COPD exacerbation [facility]   [facility], Barbarann Ehlers, MD   4 months ago Controlled type 2 diabetes mellitus without complication, without long-term current use of insulin [facility])   [facility], Barbarann Ehlers, MD   4 months ago COPD exacerbation [facility])   [facility] [facility], Barbarann Ehlers, MD   5 months ago COPD exacerbation [facility])    [facility], Barbarann Ehlers, MD

## 2020-02-13 ENCOUNTER — Other Ambulatory Visit: Payer: Self-pay | Admitting: Family Medicine

## 2020-02-23 ENCOUNTER — Other Ambulatory Visit: Payer: Self-pay | Admitting: Family Medicine

## 2020-02-25 ENCOUNTER — Other Ambulatory Visit: Payer: Self-pay | Admitting: Family Medicine

## 2020-02-26 ENCOUNTER — Other Ambulatory Visit: Payer: Self-pay | Admitting: Family Medicine

## 2020-02-26 DIAGNOSIS — J449 Chronic obstructive pulmonary disease, unspecified: Secondary | ICD-10-CM | POA: Diagnosis not present

## 2020-02-26 DIAGNOSIS — R69 Illness, unspecified: Secondary | ICD-10-CM | POA: Diagnosis not present

## 2020-03-01 ENCOUNTER — Other Ambulatory Visit: Payer: Self-pay | Admitting: Family Medicine

## 2020-03-13 ENCOUNTER — Other Ambulatory Visit: Payer: Self-pay | Admitting: Family Medicine

## 2020-03-19 DIAGNOSIS — R69 Illness, unspecified: Secondary | ICD-10-CM | POA: Diagnosis not present

## 2020-03-28 DIAGNOSIS — J449 Chronic obstructive pulmonary disease, unspecified: Secondary | ICD-10-CM | POA: Diagnosis not present

## 2020-03-31 DIAGNOSIS — R69 Illness, unspecified: Secondary | ICD-10-CM | POA: Diagnosis not present

## 2020-04-04 ENCOUNTER — Other Ambulatory Visit: Payer: Self-pay | Admitting: Family Medicine

## 2020-04-07 ENCOUNTER — Other Ambulatory Visit: Payer: Self-pay | Admitting: *Deleted

## 2020-04-07 ENCOUNTER — Other Ambulatory Visit: Payer: Self-pay | Admitting: Family Medicine

## 2020-04-17 ENCOUNTER — Other Ambulatory Visit: Payer: Self-pay | Admitting: Family Medicine

## 2020-04-27 DIAGNOSIS — J449 Chronic obstructive pulmonary disease, unspecified: Secondary | ICD-10-CM | POA: Diagnosis not present

## 2020-05-01 DIAGNOSIS — R69 Illness, unspecified: Secondary | ICD-10-CM | POA: Diagnosis not present

## 2020-05-02 ENCOUNTER — Other Ambulatory Visit: Payer: Self-pay | Admitting: Family Medicine

## 2020-05-07 DIAGNOSIS — R69 Illness, unspecified: Secondary | ICD-10-CM | POA: Diagnosis not present

## 2020-05-11 ENCOUNTER — Other Ambulatory Visit: Payer: Self-pay | Admitting: Family Medicine

## 2020-05-15 ENCOUNTER — Telehealth (INDEPENDENT_AMBULATORY_CARE_PROVIDER_SITE_OTHER): Payer: Medicare HMO | Admitting: Family Medicine

## 2020-05-15 ENCOUNTER — Encounter: Payer: Self-pay | Admitting: Family Medicine

## 2020-05-15 DIAGNOSIS — J9611 Chronic respiratory failure with hypoxia: Secondary | ICD-10-CM | POA: Diagnosis not present

## 2020-05-15 DIAGNOSIS — N1831 Chronic kidney disease, stage 3a: Secondary | ICD-10-CM | POA: Diagnosis not present

## 2020-05-15 DIAGNOSIS — J320 Chronic maxillary sinusitis: Secondary | ICD-10-CM

## 2020-05-15 DIAGNOSIS — J441 Chronic obstructive pulmonary disease with (acute) exacerbation: Secondary | ICD-10-CM | POA: Diagnosis not present

## 2020-05-15 DIAGNOSIS — E119 Type 2 diabetes mellitus without complications: Secondary | ICD-10-CM | POA: Diagnosis not present

## 2020-05-15 DIAGNOSIS — J9602 Acute respiratory failure with hypercapnia: Secondary | ICD-10-CM | POA: Diagnosis not present

## 2020-05-15 DIAGNOSIS — J9601 Acute respiratory failure with hypoxia: Secondary | ICD-10-CM | POA: Diagnosis not present

## 2020-05-15 DIAGNOSIS — I10 Essential (primary) hypertension: Secondary | ICD-10-CM | POA: Diagnosis not present

## 2020-05-15 DIAGNOSIS — E038 Other specified hypothyroidism: Secondary | ICD-10-CM | POA: Diagnosis not present

## 2020-05-15 DIAGNOSIS — J449 Chronic obstructive pulmonary disease, unspecified: Secondary | ICD-10-CM

## 2020-05-15 MED ORDER — AMOXICILLIN-POT CLAVULANATE 875-125 MG PO TABS: 1.0000 | ORAL_TABLET | Freq: Two times a day (BID) | ORAL | 0 refills | Status: DC

## 2020-05-15 MED ORDER — AMLODIPINE BESYLATE 5 MG PO TABS: 5.0000 mg | ORAL_TABLET | Freq: Every day | ORAL | 0 refills | Status: DC

## 2020-05-15 MED ORDER — FLUCONAZOLE 150 MG PO TABS: 150.0000 mg | ORAL_TABLET | Freq: Once | ORAL | 1 refills | Status: AC

## 2020-05-15 MED ORDER — MUPIROCIN 2 % EX OINT: TOPICAL_OINTMENT | CUTANEOUS | 0 refills | Status: DC

## 2020-05-15 MED ORDER — TRELEGY ELLIPTA 100-62.5-25 MCG/INH IN AEPB: 1.0000 | INHALATION_SPRAY | Freq: Every day | RESPIRATORY_TRACT | 4 refills | Status: DC

## 2020-05-15 MED ORDER — AMBULATORY NON FORMULARY MEDICATION: 0 refills | Status: DC

## 2020-05-15 MED ORDER — PROMETHAZINE HCL 25 MG PO TABS: 25.0000 mg | ORAL_TABLET | Freq: Two times a day (BID) | ORAL | 1 refills | Status: DC | PRN

## 2020-05-15 NOTE — Assessment & Plan Note (Signed)
She was unable to check BP at home.  At some point I really like her to be able to come in for an office visit so that we can check and make sure that her blood pressure is adequately controlled.  She is also overdue for blood work so strongly encouraged her to get that done as soon as possible.

## 2020-05-15 NOTE — Assessment & Plan Note (Signed)
Due to recheck TSH and make adjustments though she has not been taking it consistently so may just need to start taking it more regularly.

## 2020-05-15 NOTE — Progress Notes (Signed)
Virtual Visit via Video Note  I connected with Krystal Burgess on 05/15/20 at  3:40 PM EDT by a video enabled telemedicine application and verified that I am speaking with the correct person using two identifiers.   I discussed the limitations of evaluation and management by telemedicine and the availability of in person appointments. The patient expressed understanding and agreed to proceed.     Established Patient Office Visit  Subjective:  Patient ID: Krystal Burgess, female    DOB: 06-09-1961  Age: 59 y.o. MRN: [id number]  CC:  Chief Complaint  Patient presents with  . Diabetes  . Sinusitis    HPI Krystal Burgess presents for F/U:  COPD -she actually quit smoking at the end of March on March 19, 2020 and says she is actually been doing really well.  Her chronic cough has just about resolved.  She is only having to use her nebulizer about 2-3 times a day.  She still has not been able to get the oxygen concentr[facility].  She does need refills on her Trelegy.  She did purchase a new flutter valve and says she is working up to trying to use it she says it gives her a little bit of a headache when she tries.  More recently she has been feeling like she has a sinus infection she has been getting green nasal discharge mixed with blood on and off for the last couple of months.  She reports that she has not been taking her thyroid medication very consistently.  She also had some questions about the Covid vaccine and whether or not she should get it she says she is worried about it and I did have some very specific concerns.  She says she would also like a refill on a nasal cream that we had given her several years ago.  Past Medical History:  Diagnosis Date  . Allergy   . Hyperlipidemia   . Pain   . Thyroid disease     Past Surgical History:  Procedure Laterality Date  . ABDOMINAL HYSTERECTOMY    . CESAREAN SECTION    . CHOLECYSTECTOMY     . lipoma removal     RT shoulder  . LUMBAR SPINE SURGERY  2013   Dr. Manson Passey, L4-5  . pilonydal cyst  1994    Family History  Problem Relation Age of Onset  . Diabetes Brother   . Depression Brother        bipolar  . Cancer Brother        throat  . Diabetes Unknown        grandmother  . Depression Mother   . Hyperlipidemia Mother   . Hypertension Mother   . Pancreatic cancer Mother   . Hypertension Father     Social History   Socioeconomic History  . Marital status: Divorced    Spouse name: Not on file  . Number of children: Not on file  . Years of education: Not on file  . Highest education level: Not on file  Occupational History  . Not on file  Tobacco Use  . Smoking status: Former Smoker    Packs/day: 1.00    Quit date: 03/19/2020    Years since quitting: 0.1  . Smokeless tobacco: Never Used  Substance and Sexual Activity  . Alcohol use: Not Currently  . Drug use: No  . Sexual activity: Not Currently    Comment: s with mother currentlynemployed, on  disabilty, divorced, liver  Other Topics Concern  . Not on file  Social History Narrative  . Not on file   Social Determinants of Health   Financial Resource Strain:   . Difficulty of Paying Living Expenses:   Food Insecurity:   . Worried About Programme researcher, broadcasting/film/video in the Last Year:   . Barista in the Last Year:   Transportation Needs:   . Freight forwarder (Medical):   Marland Kitchen Lack of Transportation (Non-Medical):   Physical Activity:   . Days of Exercise per Week:   . Minutes of Exercise per Session:   Stress:   . Feeling of Stress :   Social Connections:   . Frequency of Communication with Friends and Family:   . Frequency of Social Gatherings with Friends and Family:   . Attends Religious Services:   . Active Member of Clubs or Organizations:   . Attends Banker Meetings:   Marland Kitchen Marital Status:   Intimate Partner Violence:   . Fear of Current or Ex-Partner:   . Emotionally  Abused:   Marland Kitchen Physically Abused:   . Sexually Abused:     Outpatient Medications Prior to Visit  Medication Sig Dispense Refill  . albuterol (VENTOLIN HFA) 108 (90 Base) MCG/ACT inhaler Inhale 2 puffs into the lungs every 4 (four) hours as needed for wheezing or shortness of breath. 18 g 1  . AMBULATORY NON FORMULARY MEDICATION Medication Name: ONE TOUCH METER. CHECK BLOOD SUGAR TWICE DAILY. DX:E11.9 1 each 0  . diclofenac sodium (VOLTAREN) 1 % GEL APPLE 4G TO AFFECTED AREA UP TO THREE TIMES A DAY  3  . doxepin (SINEQUAN) 10 MG capsule TAKE 1 CAPSULE BY MOUTH EVERYDAY AT BEDTIME 30 capsule 5  . fluticasone (FLONASE) 50 MCG/ACT nasal spray SPRAY 2 SPRAYS INTO EACH NOSTRIL EVERY DAY 48 mL 1  . gabapentin (NEURONTIN) 600 MG tablet Take 600 mg by mouth at bedtime.   6  . glucose blood test strip TEST TWICE A DAY. DX: E11.9 200 each 11  . ipratropium (ATROVENT) 0.06 % nasal spray Place 2 sprays into both nostrils 4 (four) times daily. 45 mL 4  . ipratropium-albuterol (DUONEB) 0.5-2.5 (3) MG/3ML SOLN TAKE 3 MLS BY NEBULIZATION EVERY 2 (TWO) HOURS WHILE AWAKE. 540 mL 11  . levocetirizine (XYZAL) 5 MG tablet Take 1 tablet (5 mg total) by mouth every evening. 90 tablet 1  . losartan (COZAAR) 100 MG tablet TAKE 1 TABLET BY MOUTH EVERY DAY 90 tablet 1  . ONETOUCH DELICA LANCETS 33G MISC USE TWICE DAILY. DX: E11.9 200 each 11  . oxyCODONE-acetaminophen (PERCOCET) 7.5-325 MG tablet Take 1 tablet by mouth every 6 (six) hours as needed.    . rosuvastatin (CRESTOR) 20 MG tablet TAKE 1 TABLET BY MOUTH EVERY DAY 90 tablet 3  . theophylline (THEODUR) 300 MG 12 hr tablet Take 1 tablet (300 mg total) by mouth 2 (two) times daily. 30 DAY SUPPLY.YOU MUST SCHEDULE/KEEP APPOINTMENT FOR REFILLS 60 tablet 0  . AMBULATORY NON FORMULARY MEDICATION Medication Name: Portable battery-powered nebulizer.Dx: COPD 1 vial 0  . AMBULATORY NON FORMULARY MEDICATION Medication Name: Portable oxygen concentrator with tubing and  supplies.  Fax to [facility].  Dx Severe COPD 1 vial 0  . amLODipine (NORVASC) 5 MG tablet TAKE 1 TABLET (5 MG TOTAL) BY MOUTH DAILY. NEEDS LABS 90 tablet 0  . Fluticasone-Umeclidin-Vilant (TRELEGY ELLIPTA) 100-62.5-25 MCG/INH AEPB Inhale 1 puff into the lungs daily. YOU MUST SCHEDULE/KEEP  APPOINTMENT FOR REFILLS 60 each 0  . oxyCODONE-acetaminophen (PERCOCET) 10-325 MG tablet Take 1 tablet by mouth every 6 (six) hours as needed.  0  . JANUVIA 100 MG tablet TAKE 1 TABLET BY MOUTH EVERY DAY (Patient not taking: Reported on 05/15/2020) 90 tablet 0  . levothyroxine (SYNTHROID, LEVOTHROID) 88 MCG tablet Take 1 tablet (88 mcg total) by mouth daily. (Patient not taking: Reported on 05/15/2020) 90 tablet 1  . nystatin (MYCOSTATIN/NYSTOP) powder Apply topically 4 (four) times daily. 60 g 1  . OXYGEN Inhale 4 L into the lungs.    . pantoprazole (PROTONIX) 40 MG tablet TAKE 1 TABLET BY MOUTH TWICE A DAY 180 tablet 1  . tiZANidine (ZANAFLEX) 4 MG tablet TAKE 1 TABLET BY MOUTH TWICE A DAY AS NEEDED MUSCLE SPASMS 30 tablet 0  . clotrimazole-betamethasone (LOTRISONE) cream Apply 1 application topically 2 (two) times daily. 45 g 1  . DULoxetine (CYMBALTA) 30 MG capsule Take 30 mg by mouth daily. Every night before bed    . lidocaine (LIDODERM) 5 % APPLY UP TO 3 PATCHES TO AFFECTED AREA DAILY PRN FOR 30 DAYS  3  . ondansetron (ZOFRAN ODT) 4 MG disintegrating tablet Take 1 tablet (4 mg total) by mouth every 8 (eight) hours as needed for nausea or vomiting. 30 tablet 0  . promethazine (PHENERGAN) 25 MG tablet TAKE 1 TABLET (25 MG TOTAL) BY MOUTH EVERY 8 (EIGHT) HOURS AS NEEDED FOR NAUSEA OR VOMITING. 30 tablet 1  . sucralfate (CARAFATE) 1 GM/10ML suspension TAKE 10 MLS (1 G TOTAL) BY MOUTH 4 (FOUR) TIMES DAILY - WITH MEALS AND AT BEDTIME. 420 mL 3   No facility-administered medications prior to visit.    Allergies  Allergen Reactions  . Risperidone And Related Shortness Of Breath  . Aripiprazole Other  (See Comments)    REACTION: muscle jerks   . Fluoxetine Hcl Other (See Comments)    REACTION: Intolerant  . Nsaids Other (See Comments)    Upper GI bleed   . Paroxetine Other (See Comments)    REACTION: Intolerance   . Ziprasidone Hcl Other (See Comments)    shakes   . Chantix [Varenicline Tartrate] Other (See Comments)    "messing with my mood"  . Metformin And Related Nausea Only  . Montelukast Other (See Comments)    "it kept me sick with an upper respiratory infection"  . Onglyza [Saxagliptin] Nausea Only  . Augmentin [Amoxicillin-Pot Clavulanate] Other (See Comments)    Gets yeast infection every time   . Fluoxetine Hcl     REACTION: Intolerant    ROS Review of Systems    Objective:    Physical Exam  There were no vitals taken for this visit. Wt Readings from Last 3 Encounters:  10/13/19 262 lb (118.8 kg)  08/16/19 252 lb (114.3 kg)  07/22/19 252 lb (114.3 kg)     Health Maintenance Due  Topic Date Due  . HEMOGLOBIN A1C  04/02/2020    There are no preventive care reminders to display for this patient.  Lab Results  Component Value Date   TSH 6.51 (H) 09/06/2018   Lab Results  Component Value Date   WBC 12.4 (H) 12/14/2019   HGB 12.7 12/14/2019   HCT 39.0 12/14/2019   MCV 90.7 12/14/2019   PLT 302 12/14/2019   Lab Results  Component Value Date   NA 143 12/14/2019   K 3.3 (L) 12/14/2019   CO2 25 12/14/2019   GLUCOSE 105 (H) 12/14/2019   BUN  9 12/14/2019   CREATININE 1.11 (H) 12/14/2019   BILITOT 0.3 12/14/2019   ALKPHOS 122 08/14/2016   AST 17 12/14/2019   ALT 11 12/14/2019   PROT 6.1 12/14/2019   ALBUMIN 4.0 08/14/2016   CALCIUM 9.0 12/14/2019   Lab Results  Component Value Date   CHOL 127 09/06/2018   Lab Results  Component Value Date   HDL 59 09/06/2018   Lab Results  Component Value Date   LDLCALC 45 09/06/2018   Lab Results  Component Value Date   TRIG 147 09/06/2018   Lab Results  Component Value Date   CHOLHDL  2.2 09/06/2018   Lab Results  Component Value Date   HGBA1C 5.5 10/03/2019      Assessment & Plan:   Problem List Items Addressed This Visit      Cardiovascular and Mediastinum   HYPERTENSION, BENIGN    She was unable to check BP at home.  At some point I really like her to be able to come in for an office visit so that we can check and make sure that her blood pressure is adequately controlled.  She is also overdue for blood work so strongly encouraged her to get that done as soon as possible.      Relevant Medications   amLODipine (NORVASC) 5 MG tablet   Other Relevant Orders   HgB A1c   COMPLETE METABOLIC PANEL WITH GFR   Lipid panel   TSH     Respiratory   COPD exacerbation (HCC)   Relevant Medications   Fluticasone-Umeclidin-Vilant (TRELEGY ELLIPTA) 100-62.5-25 MCG/INH AEPB   promethazine (PHENERGAN) 25 MG tablet   AMBULATORY NON FORMULARY MEDICATION   COPD (chronic obstructive pulmonary disease) with chronic bronchitis (HCC)   Relevant Medications   Fluticasone-Umeclidin-Vilant (TRELEGY ELLIPTA) 100-62.5-25 MCG/INH AEPB   promethazine (PHENERGAN) 25 MG tablet   AMBULATORY NON FORMULARY MEDICATION   Chronic respiratory failure with hypoxia (HCC)   Relevant Medications   AMBULATORY NON FORMULARY MEDICATION   Chronic maxillary sinusitis    Is most consistent with a chronic sinusitis.  Eventually she really needs to get back in with ENT to be evaluated for polyps but she has had some problems with in the past.  We will go ahead and treat with Augmentin and she does typically get yeast infections with this so we will also send over prescription for Diflucan.      Relevant Medications   promethazine (PHENERGAN) 25 MG tablet   amoxicillin-clavulanate (AUGMENTIN) 875-125 MG tablet   fluconazole (DIFLUCAN) 150 MG tablet   Acute respiratory failure with hypoxia and hypercapnia (HCC)    Hi to work on getting [facility] oxygen concentr[facility].         Endocrine   Hypothyroidism    Due to recheck TSH and make adjustments though she has not been taking it consistently so may just need to start taking it more regularly.      Relevant Orders   HgB A1c   COMPLETE METABOLIC PANEL WITH GFR   Lipid panel   TSH   Diabetes type 2, controlled (HCC) - Primary    Been greater than 6 months since last hemoglobin A1c encouraged her to get to the lab as soon as she is able to.  She has not been taking her Januvia consistently.      Relevant Orders   HgB A1c   COMPLETE METABOLIC PANEL WITH GFR   Lipid panel   TSH     Genitourinary  CKD (chronic kidney disease) stage 3, GFR 30-59 ml/min    Overdue to recheck renal function.         Meds ordered this encounter  Medications  . Fluticasone-Umeclidin-Vilant (TRELEGY ELLIPTA) 100-62.5-25 MCG/INH AEPB    Sig: Inhale 1 puff into the lungs daily.    Dispense:  60 each    Refill:  4  . amLODipine (NORVASC) 5 MG tablet    Sig: Take 1 tablet (5 mg total) by mouth daily. NEEDS LABS    Dispense:  90 tablet    Refill:  0  . promethazine (PHENERGAN) 25 MG tablet    Sig: Take 1 tablet (25 mg total) by mouth 2 (two) times daily as needed for nausea or vomiting.    Dispense:  30 tablet    Refill:  1  . amoxicillin-clavulanate (AUGMENTIN) 875-125 MG tablet    Sig: Take 1 tablet by mouth 2 (two) times daily.    Dispense:  42 tablet    Refill:  0  . fluconazole (DIFLUCAN) 150 MG tablet    Sig: Take 1 tablet (150 mg total) by mouth once for 1 dose. Can repeat dose in 3 days if needed.    Dispense:  2 tablet    Refill:  1  . AMBULATORY NON FORMULARY MEDICATION    Sig: Medication Name: Portable oxygen concentrator with tubing and supplies. Dx: COPD  Fax to [facility].  Dx Severe COPD    Dispense:  1 vial    Refill:  0  . mupirocin ointment (BACTROBAN) 2 %    Sig: Apply to inside of each nares daily for 10 days then twice a week for maintenance.    Dispense:  30 g    Refill:  0     Follow-up: Return in about 4 months (around 09/15/2020) for COPD.   Time spent in encounter 32 minutes  I discussed the assessment and treatment plan with the patient. The patient was provided an opportunity to ask questions and all were answered. The patient agreed with the plan and demonstrated an understanding of the instructions.   The patient was advised to call back or seek an in-person evaluation if the symptoms worsen or if the condition fails to improve as anticipated.  Nani Gasser, MD

## 2020-05-15 NOTE — Assessment & Plan Note (Signed)
Overdue to recheck renal function.

## 2020-05-15 NOTE — Assessment & Plan Note (Signed)
Hi to work on getting her oxygen concentrator through [facility].

## 2020-05-15 NOTE — Assessment & Plan Note (Signed)
Is most consistent with a chronic sinusitis.  Eventually she really needs to get back in with ENT to be evaluated for polyps but she has had some problems with in the past.  We will go ahead and treat with Augmentin and she does typically get yeast infections with this so we will also send over prescription for Diflucan.

## 2020-05-15 NOTE — Assessment & Plan Note (Signed)
Been greater than 6 months since last hemoglobin A1c encouraged her to get to the lab as soon as she is able to.  She has not been taking her Januvia consistently.

## 2020-05-24 DIAGNOSIS — M5412 Radiculopathy, cervical region: Secondary | ICD-10-CM | POA: Diagnosis not present

## 2020-05-24 DIAGNOSIS — G5603 Carpal tunnel syndrome, bilateral upper limbs: Secondary | ICD-10-CM | POA: Diagnosis not present

## 2020-05-24 DIAGNOSIS — M5441 Lumbago with sciatica, right side: Secondary | ICD-10-CM | POA: Diagnosis not present

## 2020-05-24 DIAGNOSIS — M5442 Lumbago with sciatica, left side: Secondary | ICD-10-CM | POA: Diagnosis not present

## 2020-05-26 ENCOUNTER — Other Ambulatory Visit: Payer: Self-pay | Admitting: Family Medicine

## 2020-05-28 DIAGNOSIS — J449 Chronic obstructive pulmonary disease, unspecified: Secondary | ICD-10-CM | POA: Diagnosis not present

## 2020-05-29 ENCOUNTER — Other Ambulatory Visit: Payer: Self-pay | Admitting: Family Medicine

## 2020-06-07 ENCOUNTER — Other Ambulatory Visit: Payer: Self-pay

## 2020-06-07 DIAGNOSIS — R69 Illness, unspecified: Secondary | ICD-10-CM | POA: Diagnosis not present

## 2020-06-07 NOTE — Telephone Encounter (Signed)
Krystal Burgess states she needs a refill on Naproxen. Not on current medication list. She states she takes it for her arthritis.

## 2020-06-08 ENCOUNTER — Other Ambulatory Visit: Payer: Self-pay | Admitting: Family Medicine

## 2020-06-08 MED ORDER — NAPROXEN 500 MG PO TABS: 500.0000 mg | ORAL_TABLET | Freq: Two times a day (BID) | ORAL | 1 refills | Status: DC

## 2020-06-08 NOTE — Telephone Encounter (Signed)
Med sent.

## 2020-06-18 ENCOUNTER — Other Ambulatory Visit: Payer: Self-pay | Admitting: Family Medicine

## 2020-06-27 DIAGNOSIS — J449 Chronic obstructive pulmonary disease, unspecified: Secondary | ICD-10-CM | POA: Diagnosis not present

## 2020-07-04 ENCOUNTER — Other Ambulatory Visit: Payer: Self-pay | Admitting: Family Medicine

## 2020-07-07 DIAGNOSIS — R69 Illness, unspecified: Secondary | ICD-10-CM | POA: Diagnosis not present

## 2020-07-12 ENCOUNTER — Telehealth: Payer: Self-pay

## 2020-07-12 ENCOUNTER — Other Ambulatory Visit: Payer: Self-pay | Admitting: Family Medicine

## 2020-07-12 NOTE — Telephone Encounter (Signed)
Pt called requesting med refills for levothyroxine and furosemide (not listed in active med list). Per pt, she has not taken either medications for a long time. She is aware that she is due to complete labs. Pt mentioned that she didn't want her thyroid levels off when she completes the lab order, which is why she was requesting a refill. She was informed no refills for levothyroxine will be given because she has not taken the rx for some time now and levels would be out of range. Pt was agreeable to wait until completion of labs for refills.

## 2020-07-13 NOTE — Telephone Encounter (Signed)
I would encourage her to get labs ASAP so we can restart her thyroid medication.

## 2020-07-13 NOTE — Telephone Encounter (Signed)
Task completed. Pt aware of provider's recommendation. Agreeable to complete lab order as soon as possible. No other inquiries during the call.

## 2020-07-24 DIAGNOSIS — R69 Illness, unspecified: Secondary | ICD-10-CM | POA: Diagnosis not present

## 2020-07-28 DIAGNOSIS — J449 Chronic obstructive pulmonary disease, unspecified: Secondary | ICD-10-CM | POA: Diagnosis not present

## 2020-08-02 ENCOUNTER — Other Ambulatory Visit: Payer: Self-pay | Admitting: Family Medicine

## 2020-08-02 DIAGNOSIS — G2581 Restless legs syndrome: Secondary | ICD-10-CM

## 2020-08-02 DIAGNOSIS — E119 Type 2 diabetes mellitus without complications: Secondary | ICD-10-CM

## 2020-08-02 DIAGNOSIS — Z79899 Other long term (current) drug therapy: Secondary | ICD-10-CM | POA: Diagnosis not present

## 2020-08-02 DIAGNOSIS — I10 Essential (primary) hypertension: Secondary | ICD-10-CM

## 2020-08-02 DIAGNOSIS — E538 Deficiency of other specified B group vitamins: Secondary | ICD-10-CM

## 2020-08-02 DIAGNOSIS — N1831 Chronic kidney disease, stage 3a: Secondary | ICD-10-CM

## 2020-08-02 DIAGNOSIS — E038 Other specified hypothyroidism: Secondary | ICD-10-CM

## 2020-08-02 DIAGNOSIS — E519 Thiamine deficiency, unspecified: Secondary | ICD-10-CM

## 2020-08-03 NOTE — Telephone Encounter (Signed)
Called pt and advised her that she is overdue for her labs and appointment. She stated that she was going to come yesterday since she was close by to have a UDS done however, because her legs got so weak she didn't make it over to our lab to get her blood work done.  She then proceeds to inform me that she has been experiencing a lot of pain in feet that comes out of nowhere that wakes her up at night and has been going on for sometime now along with the weakness in her legs. I asked her if she was having any swelling she stated that she was not. She said that she hasn't been as mobile and believes that this is some of why she has the weakness in her legs and asked about having her uric acid, vitamin d and b12 levels checked.I told her that she can do some chair exercises to help try strengthen her legs.  In regards to the labs I advised her that although we can check these labs she really needed to schedule an appointment so that [name] can properly assess her issues.  She voiced understanding and said that she would need to wait until her daughter returned home to call back to schedule an appointment.

## 2020-08-04 DIAGNOSIS — R69 Illness, unspecified: Secondary | ICD-10-CM | POA: Diagnosis not present

## 2020-08-07 ENCOUNTER — Ambulatory Visit: Payer: Medicare HMO | Admitting: Physician Assistant

## 2020-08-08 ENCOUNTER — Telehealth: Payer: Self-pay | Admitting: Family Medicine

## 2020-08-08 NOTE — Telephone Encounter (Signed)
Please call to to remind her to go for labwork. It is very very important

## 2020-08-08 NOTE — Telephone Encounter (Signed)
Patient getting this done Friday after appt with [facility]

## 2020-08-10 ENCOUNTER — Ambulatory Visit: Payer: Medicare HMO | Admitting: Physician Assistant

## 2020-08-17 ENCOUNTER — Other Ambulatory Visit: Payer: Self-pay | Admitting: Family Medicine

## 2020-08-18 ENCOUNTER — Other Ambulatory Visit: Payer: Self-pay | Admitting: Family Medicine

## 2020-08-23 ENCOUNTER — Other Ambulatory Visit: Payer: Self-pay | Admitting: Family Medicine

## 2020-08-28 DIAGNOSIS — J449 Chronic obstructive pulmonary disease, unspecified: Secondary | ICD-10-CM | POA: Diagnosis not present

## 2020-09-03 DIAGNOSIS — R69 Illness, unspecified: Secondary | ICD-10-CM | POA: Diagnosis not present

## 2020-09-20 DIAGNOSIS — E119 Type 2 diabetes mellitus without complications: Secondary | ICD-10-CM | POA: Diagnosis not present

## 2020-09-20 DIAGNOSIS — N1831 Chronic kidney disease, stage 3a: Secondary | ICD-10-CM | POA: Diagnosis not present

## 2020-09-20 DIAGNOSIS — E538 Deficiency of other specified B group vitamins: Secondary | ICD-10-CM | POA: Diagnosis not present

## 2020-09-20 DIAGNOSIS — I10 Essential (primary) hypertension: Secondary | ICD-10-CM | POA: Diagnosis not present

## 2020-09-20 DIAGNOSIS — E038 Other specified hypothyroidism: Secondary | ICD-10-CM | POA: Diagnosis not present

## 2020-09-20 DIAGNOSIS — G2581 Restless legs syndrome: Secondary | ICD-10-CM | POA: Diagnosis not present

## 2020-09-20 DIAGNOSIS — E519 Thiamine deficiency, unspecified: Secondary | ICD-10-CM | POA: Diagnosis not present

## 2020-09-21 ENCOUNTER — Other Ambulatory Visit: Payer: Self-pay | Admitting: Family Medicine

## 2020-09-21 ENCOUNTER — Other Ambulatory Visit: Payer: Self-pay | Admitting: *Deleted

## 2020-09-21 DIAGNOSIS — N1831 Chronic kidney disease, stage 3a: Secondary | ICD-10-CM

## 2020-09-21 DIAGNOSIS — E038 Other specified hypothyroidism: Secondary | ICD-10-CM

## 2020-09-21 DIAGNOSIS — E519 Thiamine deficiency, unspecified: Secondary | ICD-10-CM

## 2020-09-21 DIAGNOSIS — E538 Deficiency of other specified B group vitamins: Secondary | ICD-10-CM

## 2020-09-21 DIAGNOSIS — E119 Type 2 diabetes mellitus without complications: Secondary | ICD-10-CM

## 2020-09-21 DIAGNOSIS — R790 Abnormal level of blood mineral: Secondary | ICD-10-CM

## 2020-09-21 LAB — COMPLETE METABOLIC PANEL WITH GFR
AG Ratio: 1.8 (calc) (ref 1.0–2.5)
ALT: 11 U/L (ref 6–29)
AST: 23 U/L (ref 10–35)
Albumin: 4.3 g/dL (ref 3.6–5.1)
Alkaline phosphatase (APISO): 140 U/L (ref 37–153)
BUN/Creatinine Ratio: 6 (calc) (ref 6–22)
BUN: 8 mg/dL (ref 7–25)
CO2: 25 mmol/L (ref 20–32)
Calcium: 8.8 mg/dL (ref 8.6–10.4)
Chloride: 100 mmol/L (ref 98–110)
Creat: 1.38 mg/dL — ABNORMAL HIGH (ref 0.50–1.05)
GFR, Est African American: 49 mL/min/{1.73_m2} — ABNORMAL LOW (ref >=60)
GFR, Est Non African American: 42 mL/min/{1.73_m2} — ABNORMAL LOW (ref >=60)
Globulin: 2.4 g/dL (calc) (ref 1.9–3.7)
Glucose, Bld: 114 mg/dL (ref 65–139)
Potassium: 3.8 mmol/L (ref 3.5–5.3)
Sodium: 138 mmol/L (ref 135–146)
Total Bilirubin: 0.4 mg/dL (ref 0.2–1.2)
Total Protein: 6.7 g/dL (ref 6.1–8.1)

## 2020-09-21 LAB — LIPID PANEL
Cholesterol: 126 mg/dL (ref <200)
HDL: 52 mg/dL (ref >=50)
LDL Cholesterol (Calc): 47 mg/dL (calc)
Non-HDL Cholesterol (Calc): 74 mg/dL (calc) (ref <130)
Total CHOL/HDL Ratio: 2.4 (calc) (ref <5.0)
Triglycerides: 208 mg/dL — ABNORMAL HIGH (ref <150)

## 2020-09-21 LAB — HEMOGLOBIN A1C
Hgb A1c MFr Bld: 5.8 % of total Hgb — ABNORMAL HIGH (ref <5.7)
Mean Plasma Glucose: 120 (calc)
eAG (mmol/L): 6.6 (calc)

## 2020-09-21 LAB — TSH: TSH: >150 mIU/L — ABNORMAL HIGH (ref 0.40–4.50)

## 2020-09-21 LAB — URIC ACID: Uric Acid, Serum: 5.3 mg/dL (ref 2.5–7.0)

## 2020-09-21 MED ORDER — TIZANIDINE HCL 4 MG PO TABS: ORAL_TABLET | ORAL | 0 refills | Status: DC

## 2020-09-21 MED ORDER — PROMETHAZINE HCL 25 MG PO TABS: ORAL_TABLET | ORAL | 1 refills | Status: DC

## 2020-09-21 MED ORDER — LEVOTHYROXINE SODIUM 88 MCG PO TABS
88.0000 ug | ORAL_TABLET | Freq: Every day | ORAL | 1 refills | Status: DC
Start: 2020-09-21 — End: 2021-01-01

## 2020-09-27 DIAGNOSIS — J449 Chronic obstructive pulmonary disease, unspecified: Secondary | ICD-10-CM | POA: Diagnosis not present

## 2020-09-29 ENCOUNTER — Other Ambulatory Visit: Payer: Self-pay | Admitting: Family Medicine

## 2020-10-03 ENCOUNTER — Other Ambulatory Visit: Payer: Self-pay | Admitting: Family Medicine

## 2020-10-22 DIAGNOSIS — R69 Illness, unspecified: Secondary | ICD-10-CM | POA: Diagnosis not present

## 2020-10-28 DIAGNOSIS — J449 Chronic obstructive pulmonary disease, unspecified: Secondary | ICD-10-CM | POA: Diagnosis not present

## 2020-10-30 ENCOUNTER — Other Ambulatory Visit: Payer: Self-pay | Admitting: Family Medicine

## 2020-10-31 ENCOUNTER — Other Ambulatory Visit: Payer: Self-pay | Admitting: Family Medicine

## 2020-11-02 ENCOUNTER — Other Ambulatory Visit: Payer: Self-pay

## 2020-11-02 NOTE — Telephone Encounter (Signed)
Krystal Burgess is requesting another refill on Promethazine 25 mg. She picked up 30 October 1 and another 30 on October 17 th. Is it ok to refill. She must be taking it twice daily every day.   She is really wanting to get this filled.

## 2020-11-06 MED ORDER — PROMETHAZINE HCL 25 MG PO TABS
ORAL_TABLET | ORAL | 1 refills | Status: DC
Start: 2020-11-06 — End: 2021-02-24

## 2020-11-07 ENCOUNTER — Other Ambulatory Visit: Payer: Self-pay | Admitting: Family Medicine

## 2020-11-19 ENCOUNTER — Other Ambulatory Visit: Payer: Self-pay | Admitting: Family Medicine

## 2020-11-27 DIAGNOSIS — J449 Chronic obstructive pulmonary disease, unspecified: Secondary | ICD-10-CM | POA: Diagnosis not present

## 2020-12-03 ENCOUNTER — Encounter: Payer: Self-pay | Admitting: Family Medicine

## 2020-12-03 ENCOUNTER — Telehealth (INDEPENDENT_AMBULATORY_CARE_PROVIDER_SITE_OTHER): Payer: Medicare HMO | Admitting: Family Medicine

## 2020-12-03 DIAGNOSIS — J019 Acute sinusitis, unspecified: Secondary | ICD-10-CM | POA: Diagnosis not present

## 2020-12-03 DIAGNOSIS — Z87891 Personal history of nicotine dependence: Secondary | ICD-10-CM | POA: Diagnosis not present

## 2020-12-03 MED ORDER — TRELEGY ELLIPTA 100-62.5-25 MCG/INH IN AEPB
1.0000 | INHALATION_SPRAY | Freq: Every day | RESPIRATORY_TRACT | 4 refills | Status: DC
Start: 2020-12-03 — End: 2021-01-01

## 2020-12-03 MED ORDER — AZITHROMYCIN 250 MG PO TABS: ORAL_TABLET | ORAL | 0 refills | Status: AC

## 2020-12-03 MED ORDER — TIZANIDINE HCL 4 MG PO TABS
ORAL_TABLET | ORAL | 1 refills | Status: DC
Start: 2020-12-03 — End: 2021-01-16

## 2020-12-03 MED ORDER — AMBULATORY NON FORMULARY MEDICATION: 0 refills | Status: DC

## 2020-12-03 NOTE — Progress Notes (Signed)
Virtual Visit via Video Note  I connected with Krystal Burgess on 12/03/20 at 10:10 AM EST by a video enabled telemedicine application and verified that I am speaking with the correct person using two identifiers.   I discussed the limitations of evaluation and management by telemedicine and the availability of in person appointments. The patient expressed understanding and agreed to proceed.  Patient location: at home Provider location: in office  Subjective:    CC: Sinus sxs  HPI: Sinus sxs x 2-3 weeks. Congestion, burning and bleeding.  Green nasal discharge. She stated that they just moved into a new house.   Denies any f/s/c/n/v/d/body aches.   She does state that she is experiencing facial pain under her eyes, around her nose. She has been taking BC sinus powders. She also tried Mucinex sinus. She has a mixture of bloody green mucus that she has been blowing out of her nose. Hasn't had any cough associated with this. No cough, no chest sxs.     She keeps running out of the Trelegy.   She would like a RF for Tizanidine and a printed Rx for a portable battery O2 concentrator.  She would like that to be mailed or she will have her daughter pick this up for her.  She has still quit smoking.     Past medical history, Surgical history, Family history not pertinant except as noted below, Social history, Allergies, and medications have been entered into the medical record, reviewed, and corrections made.   Review of Systems: No fevers, chills, night sweats, weight loss, chest pain, or shortness of breath.   Objective:    General: Speaking clearly in complete sentences without any shortness of breath.  Alert and oriented x3.  Normal judgment. No apparent acute distress.    Impression and Recommendations:    No problem-specific Assessment & Plan notes found for this encounter.  Acute sinusitis-we will treat with azithromycin recommend really working on moisturizing the nasal  passages with saline irrigation as well as humidifier especially since the secretions seem to be very thick and stuck.  If not improving over the next week please let us know.  COPD-we will give printed prescription for excision concentrator.  Refilled her Trelegy as well.   Time spent in encounter 20 minutes  I discussed the assessment and treatment plan with the patient. The patient was provided an opportunity to ask questions and all were answered. The patient agreed with the plan and demonstrated an understanding of the instructions.   The patient was advised to call back or seek an in-person evaluation if the symptoms worsen or if the condition fails to improve as anticipated.   Nani Gasser, MD

## 2020-12-03 NOTE — Progress Notes (Signed)
sxs x 2-3 weeks. She stated that they just moved into a new house.   Denies any f/s/c/n/v/d/body aches.   She does state that she is experiencing facial pain under her eyes, around her nose.  She has been taking BC sinus powders. She also tried Mucinex sinus.  She has a mixture of bloody green mucus that she has been blowing out of her nose. Hasn't had any cough associated with this.    She keeps running out of the Trelegy.   She would like a RF for Tizanidine and a printed Rx for a portable battery O2 concentrator.  She would like that to be mailed or she will have her daughter pick this up for her.

## 2020-12-05 ENCOUNTER — Encounter: Payer: Self-pay | Admitting: Family Medicine

## 2020-12-05 MED ORDER — PREDNISONE 5 MG PO TABS: 5.0000 mg | ORAL_TABLET | Freq: Every day | ORAL | 2 refills | Status: DC

## 2020-12-05 MED ORDER — PREDNISONE 20 MG PO TABS: ORAL_TABLET | ORAL | 0 refills | Status: DC

## 2020-12-10 ENCOUNTER — Other Ambulatory Visit: Payer: Self-pay | Admitting: Family Medicine

## 2020-12-15 ENCOUNTER — Other Ambulatory Visit: Payer: Self-pay | Admitting: Family Medicine

## 2020-12-19 ENCOUNTER — Other Ambulatory Visit: Payer: Self-pay | Admitting: Family Medicine

## 2020-12-24 ENCOUNTER — Other Ambulatory Visit: Payer: Self-pay | Admitting: Family Medicine

## 2020-12-25 ENCOUNTER — Other Ambulatory Visit: Payer: Self-pay | Admitting: Family Medicine

## 2020-12-28 DIAGNOSIS — J449 Chronic obstructive pulmonary disease, unspecified: Secondary | ICD-10-CM | POA: Diagnosis not present

## 2020-12-31 ENCOUNTER — Encounter: Payer: Self-pay | Admitting: Family Medicine

## 2020-12-31 NOTE — Telephone Encounter (Signed)
Virtual visit scheduled for tomorrow with SaraBeth.

## 2020-12-31 NOTE — Telephone Encounter (Signed)
Schedule for virtual appointment with one of my partners if I do not have any availability.

## 2021-01-01 ENCOUNTER — Telehealth (INDEPENDENT_AMBULATORY_CARE_PROVIDER_SITE_OTHER): Payer: Medicare HMO | Admitting: Nurse Practitioner

## 2021-01-01 ENCOUNTER — Encounter: Payer: Self-pay | Admitting: Nurse Practitioner

## 2021-01-01 DIAGNOSIS — J329 Chronic sinusitis, unspecified: Secondary | ICD-10-CM

## 2021-01-01 DIAGNOSIS — J40 Bronchitis, not specified as acute or chronic: Secondary | ICD-10-CM

## 2021-01-01 MED ORDER — ALBUTEROL SULFATE HFA 108 (90 BASE) MCG/ACT IN AERS: 2.0000 | INHALATION_SPRAY | RESPIRATORY_TRACT | 1 refills | Status: DC | PRN

## 2021-01-01 MED ORDER — PREDNISONE 20 MG PO TABS: ORAL_TABLET | ORAL | 0 refills | Status: DC

## 2021-01-01 MED ORDER — IPRATROPIUM-ALBUTEROL 0.5-2.5 (3) MG/3ML IN SOLN: RESPIRATORY_TRACT | 11 refills | Status: DC

## 2021-01-01 MED ORDER — LOSARTAN POTASSIUM 100 MG PO TABS: 100.0000 mg | ORAL_TABLET | Freq: Every day | ORAL | 0 refills | Status: DC

## 2021-01-01 MED ORDER — AMLODIPINE BESYLATE 5 MG PO TABS: 5.0000 mg | ORAL_TABLET | Freq: Every day | ORAL | 0 refills | Status: DC

## 2021-01-01 MED ORDER — IPRATROPIUM BROMIDE 0.06 % NA SOLN: 2.0000 | Freq: Four times a day (QID) | NASAL | 4 refills | Status: DC

## 2021-01-01 MED ORDER — FLUCONAZOLE 150 MG PO TABS: 150.0000 mg | ORAL_TABLET | Freq: Once | ORAL | 0 refills | Status: AC

## 2021-01-01 MED ORDER — TRELEGY ELLIPTA 100-62.5-25 MCG/INH IN AEPB: 1.0000 | INHALATION_SPRAY | Freq: Every day | RESPIRATORY_TRACT | 4 refills | Status: DC

## 2021-01-01 MED ORDER — LEVOTHYROXINE SODIUM 88 MCG PO TABS: 88.0000 ug | ORAL_TABLET | Freq: Every day | ORAL | 1 refills | Status: DC

## 2021-01-01 MED ORDER — AZITHROMYCIN 250 MG PO TABS: ORAL_TABLET | ORAL | 0 refills | Status: DC

## 2021-01-01 MED ORDER — ROSUVASTATIN CALCIUM 20 MG PO TABS: 20.0000 mg | ORAL_TABLET | Freq: Every day | ORAL | 2 refills | Status: DC

## 2021-01-01 MED ORDER — PANTOPRAZOLE SODIUM 40 MG PO TBEC: 40.0000 mg | DELAYED_RELEASE_TABLET | Freq: Two times a day (BID) | ORAL | 1 refills | Status: DC

## 2021-01-01 MED ORDER — LEVOCETIRIZINE DIHYDROCHLORIDE 5 MG PO TABS: 5.0000 mg | ORAL_TABLET | Freq: Every evening | ORAL | 1 refills | Status: DC

## 2021-01-01 MED ORDER — FLUTICASONE PROPIONATE 50 MCG/ACT NA SUSP: NASAL | 1 refills | Status: DC

## 2021-01-01 MED ORDER — GABAPENTIN 600 MG PO TABS: 600.0000 mg | ORAL_TABLET | Freq: Every day | ORAL | 6 refills | Status: DC

## 2021-01-01 NOTE — Progress Notes (Signed)
Virtual Visit via Telephone Note  I connected with  [name] on 01/01/21 at  1:30 PM EST by telephone and verified that I am speaking with the correct person using two identifiers.   I discussed the limitations, risks, security and privacy concerns of performing an evaluation and management service by telephone and the availability of in person appointments. I also discussed with the patient that there may be a patient responsible charge related to this service. The patient expressed understanding and agreed to proceed.  Participating parties included in this telephone visit include: The patient and the nurse practitioner listed.  The patient is: At home I am: In the office  Subjective:    CC: Sinusitis  HPI: Krystal Burgess is a 60 y.o. year old female presenting today via telephone visit to discuss symptoms of sinusitis.   She reports that she has chronic sinusitis and has had several exacerbations over the last few months after moving into a new house. She tells me that since late December she has been experiencing sinus pain and pressure, but over the last 4-5 days it has significantly worsening. She endorses congestion, facial pain, sinus pressure, headache, epistaxis, cough, and headache. She is also experiencing shortness of breath a little worse than her baseline.  She denies fever or chills at this time.  She is on oxygen intermittently at home and has not had to have increased oxygen use since being sick.   Past medical history, Surgical history, Family history not pertinant except as noted below, Social history, Allergies, and medications have been entered into the medical record, reviewed, and corrections made.   Review of Systems:  All review of systems negative except what is listed in the HPI  Objective:    General:  Patient speaking clearly in complete sentences. Mild shortness of breath noted.   Alert and oriented x3.   Normal judgment.  No apparent acute  distress.  Impression and Recommendations:    1. Sinobronchitis - predniSONE (DELTASONE) 20 MG tablet; Take 3 tablets for 3 days, take 2 tablets for 3 days, take 1 tablet for 3 days, take 1/2 tablet for 4 days.  Dispense: 20 tablet; Refill: 0 - azithromycin (ZITHROMAX) 250 MG tablet; Take 2 tabs (500 mg) together on the first day, then 1 tab (250 mg) daily until prescription complete.  Dispense: 10 tablet; Refill: 0 - fluconazole (DIFLUCAN) 150 MG tablet; Take 1 tablet (150 mg total) by mouth once for 1 dose.  Dispense: 1 tablet; Refill: 0 - albuterol (VENTOLIN HFA) 108 (90 Base) MCG/ACT inhaler; Inhale 2 puffs into the lungs every 4 (four) hours as needed for wheezing or shortness of breath.  Dispense: 18 g; Refill: 1 - ipratropium-albuterol (DUONEB) 0.5-2.5 (3) MG/3ML SOLN; TAKE 3 MLS BY NEBULIZATION EVERY 2 (TWO) HOURS WHILE AWAKE.  Dispense: 540 mL; Refill: 11 - ipratropium (ATROVENT) 0.06 % nasal spray; Place 2 sprays into both nostrils 4 (four) times daily.  Dispense: 45 mL; Refill: 4 - levocetirizine (XYZAL) 5 MG tablet; Take 1 tablet (5 mg total) by mouth every evening.  Dispense: 90 tablet; Refill: 1 - fluticasone (FLONASE) 50 MCG/ACT nasal spray; SPRAY 2 SPRAYS INTO EACH NOSTRIL EVERY DAY  Dispense: 48 mL; Refill: 1  Symptoms and presentation consistent with exacerbation of chronic sinusitis. Seeing that her symptoms have been ongoing for a few weeks and have recently worsened, we will begin treatment today with azithromycin. She reports that she responds well to steroid taper, therefore, we will begin that  as well. Refills provided on her chronic respiratory medications as she has switched pharmacies since moving and does not have refills available.  Recommendations for OTC treatment and warning signs to be alert for that could indicate worsening infection.   Refills also provided for other chronic medications for patient.  Follow-up if symptoms worsen or fail to improve.   I discussed  the assessment and treatment plan with the patient. The patient was provided an opportunity to ask questions and all were answered. The patient agreed with the plan and demonstrated an understanding of the instructions.   The patient was advised to call back or seek an in-person evaluation if the symptoms worsen or if the condition fails to improve as anticipated.  I provided 20 minutes of non-face-to-face time during this TELEPHONE encounter.    Tollie Eth, NP

## 2021-01-01 NOTE — Patient Instructions (Signed)

## 2021-01-03 ENCOUNTER — Encounter: Payer: Self-pay | Admitting: Family Medicine

## 2021-01-03 DIAGNOSIS — J9601 Acute respiratory failure with hypoxia: Secondary | ICD-10-CM

## 2021-01-03 DIAGNOSIS — J9602 Acute respiratory failure with hypercapnia: Secondary | ICD-10-CM

## 2021-01-03 DIAGNOSIS — J449 Chronic obstructive pulmonary disease, unspecified: Secondary | ICD-10-CM | POA: Diagnosis not present

## 2021-01-03 MED ORDER — AMBULATORY NON FORMULARY MEDICATION: 0 refills | Status: DC

## 2021-01-03 NOTE — Telephone Encounter (Signed)
Order pended, sign if appropriate

## 2021-01-03 NOTE — Telephone Encounter (Signed)
rx printed and signed. OK to fax.

## 2021-01-04 NOTE — Telephone Encounter (Signed)
Faxed

## 2021-01-09 ENCOUNTER — Encounter: Payer: Self-pay | Admitting: Family Medicine

## 2021-01-09 DIAGNOSIS — E038 Other specified hypothyroidism: Secondary | ICD-10-CM

## 2021-01-09 DIAGNOSIS — R790 Abnormal level of blood mineral: Secondary | ICD-10-CM

## 2021-01-09 DIAGNOSIS — I10 Essential (primary) hypertension: Secondary | ICD-10-CM

## 2021-01-09 DIAGNOSIS — E519 Thiamine deficiency, unspecified: Secondary | ICD-10-CM

## 2021-01-09 DIAGNOSIS — E119 Type 2 diabetes mellitus without complications: Secondary | ICD-10-CM

## 2021-01-09 DIAGNOSIS — N1831 Chronic kidney disease, stage 3a: Secondary | ICD-10-CM

## 2021-01-09 DIAGNOSIS — G2581 Restless legs syndrome: Secondary | ICD-10-CM

## 2021-01-09 DIAGNOSIS — E538 Deficiency of other specified B group vitamins: Secondary | ICD-10-CM

## 2021-01-09 DIAGNOSIS — E618 Deficiency of other specified nutrient elements: Secondary | ICD-10-CM

## 2021-01-10 NOTE — Telephone Encounter (Signed)
Looks like labs are ordered from October, please add labs if needed.

## 2021-01-11 NOTE — Telephone Encounter (Signed)
There is an order in from August 13 that I would like them to draw.  It has the regular stuff as well as the vitamins.  The only thing that is missing would be the iron.  So we could add a ferritin to that but I just want a make sure the lab draws the ones from August 13 not the ones from October 1.

## 2021-01-16 ENCOUNTER — Other Ambulatory Visit: Payer: Self-pay | Admitting: Physician Assistant

## 2021-01-16 ENCOUNTER — Other Ambulatory Visit: Payer: Self-pay | Admitting: Family Medicine

## 2021-01-16 DIAGNOSIS — L304 Erythema intertrigo: Secondary | ICD-10-CM

## 2021-01-16 DIAGNOSIS — J449 Chronic obstructive pulmonary disease, unspecified: Secondary | ICD-10-CM | POA: Diagnosis not present

## 2021-01-21 ENCOUNTER — Telehealth (INDEPENDENT_AMBULATORY_CARE_PROVIDER_SITE_OTHER): Payer: Medicare HMO | Admitting: Family Medicine

## 2021-01-21 ENCOUNTER — Encounter: Payer: Self-pay | Admitting: Family Medicine

## 2021-01-21 DIAGNOSIS — J449 Chronic obstructive pulmonary disease, unspecified: Secondary | ICD-10-CM

## 2021-01-21 DIAGNOSIS — U071 COVID-19: Secondary | ICD-10-CM | POA: Diagnosis not present

## 2021-01-21 MED ORDER — DOXYCYCLINE HYCLATE 100 MG PO TABS: 100.0000 mg | ORAL_TABLET | Freq: Two times a day (BID) | ORAL | 0 refills | Status: DC

## 2021-01-21 MED ORDER — PREDNISONE 20 MG PO TABS: 40.0000 mg | ORAL_TABLET | Freq: Every day | ORAL | 0 refills | Status: DC

## 2021-01-21 NOTE — Progress Notes (Signed)
Virtual Visit via Video Note  I connected with Krystal Burgess on 01/21/21 at  2:00 PM EST by a video enabled telemedicine application and verified that I am speaking with the correct person using two identifiers.   I discussed the limitations of evaluation and management by telemedicine and the availability of in person appointments. The patient expressed understanding and agreed to proceed.  Patient location: athome Provider location: in office  Subjective:    CC: COVID   HPI: Tested positive on positive on Saturday her whole family is positive. She started feeling bad a couple of day sbefore that.  Her sxs are headache,cough,congestion, nausea, fatigue. No inc SOB.  She taking mucinex,vit d,and c. Fever,sweats and chills. Fluid in her right ear.  She hasn't been vaccinated. Hx of oxygen dependent COPD.   NO diarrhea.   Past medical history, Surgical history, Family history not pertinant except as noted below, Social history, Allergies, and medications have been entered into the medical record, reviewed, and corrections made.   Review of Systems: No fevers, chills, night sweats, weight loss, chest pain, or shortness of breath.   Objective:    General: Speaking clearly in complete sentences without any shortness of breath.  Alert and oriented x3.  Normal judgment. No apparent acute distress.    Impression and Recommendations:    No problem-specific Assessment & Plan notes found for this encounter.  COVID-19-discussed diagnosis.  For now I think she is doing okay on holding her own.  She has not noticed a drop in her pulse ox and she is not feeling more short of breath than usual.  Recommend symptomatic care.  She can also add vitamin C, vitamin D and zinc.  Discussed that she might be a good candidate for referral for antibody infusions that she has not been vaccinated and she is high risk.  She is okay with [place] placing the referral.  In the meantime we did discuss treating with an  antibiotic and prednisone only if she gets worse.  I did send over prescription to pharmacy but did warn her about taking it too soon but certainly if she feels like she starting to take a turn and she feels like it really is causing a COPD exacerbation and she can fill the medication.  If she starts dropping her oxygen less than 90% and she is not able to maintain it with her O2 then please go to the emergency department.  Meds ordered this encounter  Medications  . predniSONE (DELTASONE) 20 MG tablet    Sig: Take 2 tablets (40 mg total) by mouth daily with breakfast.    Dispense:  10 tablet    Refill:  0  . doxycycline (VIBRA-TABS) 100 MG tablet    Sig: Take 1 tablet (100 mg total) by mouth 2 (two) times daily.    Dispense:  20 tablet    Refill:  0      Time spent in encounter 21 minutes  I discussed the assessment and treatment plan with the patient. The patient was provided an opportunity to ask questions and all were answered. The patient agreed with the plan and demonstrated an understanding of the instructions.   The patient was advised to call back or seek an in-person evaluation if the symptoms worsen or if the condition fails to improve as anticipated.   Nani Gasser, MD

## 2021-01-21 NOTE — Progress Notes (Signed)
Tested positive on positive on Saturday her whole family is positive.   Her sxs are headache,cough,congestion,nausea,fatigue. She taking mucinex,vit d,and c. Fever,sweats and chills

## 2021-01-22 ENCOUNTER — Ambulatory Visit (HOSPITAL_COMMUNITY): Payer: Medicare HMO | Attending: Pulmonary Disease

## 2021-01-22 ENCOUNTER — Other Ambulatory Visit: Payer: Self-pay | Admitting: Nurse Practitioner

## 2021-01-22 ENCOUNTER — Telehealth: Payer: Self-pay | Admitting: Nurse Practitioner

## 2021-01-22 ENCOUNTER — Encounter: Payer: Self-pay | Admitting: Nurse Practitioner

## 2021-01-22 DIAGNOSIS — J449 Chronic obstructive pulmonary disease, unspecified: Secondary | ICD-10-CM

## 2021-01-22 DIAGNOSIS — I10 Essential (primary) hypertension: Secondary | ICD-10-CM

## 2021-01-22 DIAGNOSIS — J9611 Chronic respiratory failure with hypoxia: Secondary | ICD-10-CM

## 2021-01-22 DIAGNOSIS — E119 Type 2 diabetes mellitus without complications: Secondary | ICD-10-CM

## 2021-01-22 DIAGNOSIS — E038 Other specified hypothyroidism: Secondary | ICD-10-CM

## 2021-01-22 DIAGNOSIS — N1831 Chronic kidney disease, stage 3a: Secondary | ICD-10-CM

## 2021-01-22 DIAGNOSIS — U071 COVID-19: Secondary | ICD-10-CM

## 2021-01-22 DIAGNOSIS — J45909 Unspecified asthma, uncomplicated: Secondary | ICD-10-CM

## 2021-01-22 DIAGNOSIS — Z87891 Personal history of nicotine dependence: Secondary | ICD-10-CM

## 2021-01-22 NOTE — Progress Notes (Signed)
Orders placed for outpatient mAb, Sotrovimab at the [facility]. Appointment scheduled for 01/22/2021 at 2:30. Instructions provided and all questions answered.

## 2021-01-22 NOTE — Telephone Encounter (Signed)
I connected by phone with Krystal Burgess on 01/22/2021 at 9:18 AM to discuss the potential use of a new treatment for mild to moderate COVID-19 viral infection in non-hospitalized patients.  This patient is a 60 y.o. female that meets the FDA criteria for Emergency Use Authorization of COVID monoclonal antibody sotrovimab.  Has a (+) direct SARS-CoV-2 viral test result  Has mild or moderate COVID-19   Is NOT hospitalized due to COVID-19  Is within 10 days of symptom onset  Has at least one of the high risk factor(s) for progression to severe COVID-19 and/or hospitalization as defined in EUA.  Specific high risk criteria : BMI > 25, Chronic Kidney Disease (CKD), Diabetes, Cardiovascular disease or hypertension and Chronic Lung Disease   I have spoken and communicated the following to the patient or parent/caregiver regarding COVID monoclonal antibody treatment:  1. FDA has authorized the emergency use for the treatment of mild to moderate COVID-19 in adults and pediatric patients with positive results of direct SARS-CoV-2 viral testing who are 44 years of age and older weighing at least 40 kg, and who are at high risk for progressing to severe COVID-19 and/or hospitalization.  2. The significant known and potential risks and benefits of COVID monoclonal antibody, and the extent to which such potential risks and benefits are unknown.  3. Information on available alternative treatments and the risks and benefits of those alternatives, including clinical trials.  4. Patients treated with COVID monoclonal antibody should continue to self-isolate and use infection control measures (e.g., wear mask, isolate, social distance, avoid sharing personal items, clean and disinfect "high touch" surfaces, and frequent handwashing) according to CDC guidelines.   5. The patient or parent/caregiver has the option to accept or refuse COVID monoclonal antibody treatment.  After reviewing this information  with the patient, the patient has agreed to receive one of the available covid 19 monoclonal antibodies and will be provided an appropriate fact sheet prior to infusion. Tollie Eth, NP 01/22/2021 9:18 AM

## 2021-01-23 ENCOUNTER — Ambulatory Visit (HOSPITAL_COMMUNITY)
Admission: RE | Admit: 2021-01-23 | Discharge: 2021-01-23 | Disposition: A | Discharge status: Home / Self Care | Payer: Medicare HMO | Source: Ambulatory Visit | Attending: Pulmonary Disease | Admitting: Pulmonary Disease

## 2021-01-23 DIAGNOSIS — J449 Chronic obstructive pulmonary disease, unspecified: Secondary | ICD-10-CM | POA: Diagnosis not present

## 2021-01-23 DIAGNOSIS — J9611 Chronic respiratory failure with hypoxia: Secondary | ICD-10-CM | POA: Insufficient documentation

## 2021-01-23 DIAGNOSIS — J45909 Unspecified asthma, uncomplicated: Secondary | ICD-10-CM

## 2021-01-23 DIAGNOSIS — U071 COVID-19: Secondary | ICD-10-CM | POA: Insufficient documentation

## 2021-01-23 DIAGNOSIS — E1322 Other specified diabetes mellitus with diabetic chronic kidney disease: Secondary | ICD-10-CM | POA: Insufficient documentation

## 2021-01-23 DIAGNOSIS — I10 Essential (primary) hypertension: Secondary | ICD-10-CM

## 2021-01-23 DIAGNOSIS — I129 Hypertensive chronic kidney disease with stage 1 through stage 4 chronic kidney disease, or unspecified chronic kidney disease: Secondary | ICD-10-CM | POA: Insufficient documentation

## 2021-01-23 DIAGNOSIS — Z87891 Personal history of nicotine dependence: Secondary | ICD-10-CM

## 2021-01-23 DIAGNOSIS — N1831 Chronic kidney disease, stage 3a: Secondary | ICD-10-CM | POA: Insufficient documentation

## 2021-01-23 DIAGNOSIS — E038 Other specified hypothyroidism: Secondary | ICD-10-CM

## 2021-01-23 DIAGNOSIS — E119 Type 2 diabetes mellitus without complications: Secondary | ICD-10-CM

## 2021-01-23 DIAGNOSIS — Z6841 Body Mass Index (BMI) 40.0 and over, adult: Secondary | ICD-10-CM | POA: Diagnosis not present

## 2021-01-23 MED ORDER — EPINEPHRINE 0.3 MG/0.3ML IJ SOAJ: 0.3000 mg | Freq: Once | INTRAMUSCULAR | Status: DC | PRN

## 2021-01-23 MED ORDER — SOTROVIMAB 500 MG/8ML IV SOLN
500.0000 mg | Freq: Once | INTRAVENOUS | Status: AC
  Administered 2021-01-23: 500 mg via INTRAVENOUS

## 2021-01-23 MED ORDER — ALBUTEROL SULFATE HFA 108 (90 BASE) MCG/ACT IN AERS: 2.0000 | INHALATION_SPRAY | Freq: Once | RESPIRATORY_TRACT | Status: DC | PRN

## 2021-01-23 MED ORDER — METHYLPREDNISOLONE SODIUM SUCC 125 MG IJ SOLR: 125.0000 mg | Freq: Once | INTRAMUSCULAR | Status: DC | PRN

## 2021-01-23 MED ORDER — SODIUM CHLORIDE 0.9 % IV SOLN: INTRAVENOUS | Status: DC | PRN

## 2021-01-23 MED ORDER — DIPHENHYDRAMINE HCL 50 MG/ML IJ SOLN: 50.0000 mg | Freq: Once | INTRAMUSCULAR | Status: DC | PRN

## 2021-01-23 MED ORDER — FAMOTIDINE IN NACL 20-0.9 MG/50ML-% IV SOLN: 20.0000 mg | Freq: Once | INTRAVENOUS | Status: DC | PRN

## 2021-01-23 MED ORDER — ONDANSETRON HCL 4 MG/2ML IJ SOLN
4.0000 mg | Freq: Once | INTRAMUSCULAR | Status: AC
  Administered 2021-01-23: 4 mg via INTRAVENOUS
  Filled 2021-01-23: qty 2

## 2021-01-23 NOTE — Telephone Encounter (Signed)
Thank you so much!!!! I really appreciate it.  I hope you are well!!

## 2021-01-23 NOTE — Progress Notes (Signed)
Diagnosis: COVID-19  Physician: Dr. Patrick Wright  Procedure: [facility] Med: Sotrovimab infusion - Provided patient with sotrovimab fact sheet for patients, parents, and caregivers prior to infusion.   Complications: No immediate complications noted  Discharge: Discharged home    

## 2021-01-23 NOTE — Discharge Instructions (Signed)

## 2021-01-23 NOTE — Progress Notes (Signed)
Patient reviewed Fact Sheet for Patients, Parents, and Caregivers for Emergency Use Authorization (EUA) of sotrovimab for the Treatment of Coronavirus. Patient also reviewed and is agreeable to the estimated cost of treatment. Patient is agreeable to proceed.   

## 2021-01-24 ENCOUNTER — Other Ambulatory Visit: Payer: Self-pay | Admitting: Family Medicine

## 2021-01-28 DIAGNOSIS — J449 Chronic obstructive pulmonary disease, unspecified: Secondary | ICD-10-CM | POA: Diagnosis not present

## 2021-01-28 DIAGNOSIS — R69 Illness, unspecified: Secondary | ICD-10-CM | POA: Diagnosis not present

## 2021-02-05 ENCOUNTER — Ambulatory Visit: Payer: Medicare HMO | Admitting: Family Medicine

## 2021-02-19 ENCOUNTER — Other Ambulatory Visit: Payer: Self-pay | Admitting: Nurse Practitioner

## 2021-02-19 DIAGNOSIS — J329 Chronic sinusitis, unspecified: Secondary | ICD-10-CM

## 2021-02-19 DIAGNOSIS — J40 Bronchitis, not specified as acute or chronic: Secondary | ICD-10-CM

## 2021-02-24 ENCOUNTER — Other Ambulatory Visit: Payer: Self-pay | Admitting: Family Medicine

## 2021-02-25 ENCOUNTER — Other Ambulatory Visit: Payer: Self-pay | Admitting: Family Medicine

## 2021-02-25 DIAGNOSIS — J449 Chronic obstructive pulmonary disease, unspecified: Secondary | ICD-10-CM | POA: Diagnosis not present

## 2021-03-02 ENCOUNTER — Emergency Department (HOSPITAL_COMMUNITY): Payer: Medicare HMO

## 2021-03-02 ENCOUNTER — Other Ambulatory Visit: Payer: Self-pay

## 2021-03-02 ENCOUNTER — Encounter (HOSPITAL_COMMUNITY): Payer: Self-pay | Admitting: Emergency Medicine

## 2021-03-02 ENCOUNTER — Inpatient Hospital Stay (HOSPITAL_COMMUNITY)
Admission: EM | Admit: 2021-03-02 | Discharge: 2021-03-04 | DRG: 193 | Disposition: A | Discharge status: Home Health | Payer: Medicare HMO | Attending: Internal Medicine | Admitting: Internal Medicine

## 2021-03-02 DIAGNOSIS — E785 Hyperlipidemia, unspecified: Secondary | ICD-10-CM | POA: Diagnosis present

## 2021-03-02 DIAGNOSIS — R0902 Hypoxemia: Secondary | ICD-10-CM

## 2021-03-02 DIAGNOSIS — J441 Chronic obstructive pulmonary disease with (acute) exacerbation: Secondary | ICD-10-CM | POA: Diagnosis not present

## 2021-03-02 DIAGNOSIS — N183 Chronic kidney disease, stage 3 unspecified: Secondary | ICD-10-CM | POA: Diagnosis present

## 2021-03-02 DIAGNOSIS — Z79890 Hormone replacement therapy (postmenopausal): Secondary | ICD-10-CM | POA: Diagnosis not present

## 2021-03-02 DIAGNOSIS — K219 Gastro-esophageal reflux disease without esophagitis: Secondary | ICD-10-CM | POA: Diagnosis not present

## 2021-03-02 DIAGNOSIS — D72829 Elevated white blood cell count, unspecified: Secondary | ICD-10-CM | POA: Diagnosis not present

## 2021-03-02 DIAGNOSIS — I10 Essential (primary) hypertension: Secondary | ICD-10-CM | POA: Diagnosis not present

## 2021-03-02 DIAGNOSIS — R0602 Shortness of breath: Secondary | ICD-10-CM | POA: Diagnosis not present

## 2021-03-02 DIAGNOSIS — W19XXXA Unspecified fall, initial encounter: Secondary | ICD-10-CM | POA: Diagnosis not present

## 2021-03-02 DIAGNOSIS — G629 Polyneuropathy, unspecified: Secondary | ICD-10-CM

## 2021-03-02 DIAGNOSIS — I129 Hypertensive chronic kidney disease with stage 1 through stage 4 chronic kidney disease, or unspecified chronic kidney disease: Secondary | ICD-10-CM | POA: Diagnosis present

## 2021-03-02 DIAGNOSIS — Z79899 Other long term (current) drug therapy: Secondary | ICD-10-CM

## 2021-03-02 DIAGNOSIS — Z90710 Acquired absence of both cervix and uterus: Secondary | ICD-10-CM | POA: Diagnosis not present

## 2021-03-02 DIAGNOSIS — J9622 Acute and chronic respiratory failure with hypercapnia: Secondary | ICD-10-CM | POA: Diagnosis not present

## 2021-03-02 DIAGNOSIS — J449 Chronic obstructive pulmonary disease, unspecified: Secondary | ICD-10-CM | POA: Diagnosis not present

## 2021-03-02 DIAGNOSIS — E1122 Type 2 diabetes mellitus with diabetic chronic kidney disease: Secondary | ICD-10-CM | POA: Diagnosis present

## 2021-03-02 DIAGNOSIS — Z7952 Long term (current) use of systemic steroids: Secondary | ICD-10-CM

## 2021-03-02 DIAGNOSIS — Z9981 Dependence on supplemental oxygen: Secondary | ICD-10-CM | POA: Diagnosis not present

## 2021-03-02 DIAGNOSIS — Z7951 Long term (current) use of inhaled steroids: Secondary | ICD-10-CM | POA: Diagnosis not present

## 2021-03-02 DIAGNOSIS — Z6841 Body Mass Index (BMI) 40.0 and over, adult: Secondary | ICD-10-CM | POA: Diagnosis not present

## 2021-03-02 DIAGNOSIS — J9621 Acute and chronic respiratory failure with hypoxia: Secondary | ICD-10-CM

## 2021-03-02 DIAGNOSIS — J440 Chronic obstructive pulmonary disease with acute lower respiratory infection: Secondary | ICD-10-CM | POA: Diagnosis not present

## 2021-03-02 DIAGNOSIS — Z743 Need for continuous supervision: Secondary | ICD-10-CM | POA: Diagnosis not present

## 2021-03-02 DIAGNOSIS — N1832 Chronic kidney disease, stage 3b: Secondary | ICD-10-CM | POA: Diagnosis present

## 2021-03-02 DIAGNOSIS — Z87891 Personal history of nicotine dependence: Secondary | ICD-10-CM

## 2021-03-02 DIAGNOSIS — G4733 Obstructive sleep apnea (adult) (pediatric): Secondary | ICD-10-CM

## 2021-03-02 DIAGNOSIS — E039 Hypothyroidism, unspecified: Secondary | ICD-10-CM | POA: Diagnosis present

## 2021-03-02 DIAGNOSIS — R7989 Other specified abnormal findings of blood chemistry: Secondary | ICD-10-CM

## 2021-03-02 DIAGNOSIS — E119 Type 2 diabetes mellitus without complications: Secondary | ICD-10-CM

## 2021-03-02 DIAGNOSIS — R1032 Left lower quadrant pain: Secondary | ICD-10-CM | POA: Diagnosis not present

## 2021-03-02 DIAGNOSIS — E782 Mixed hyperlipidemia: Secondary | ICD-10-CM | POA: Diagnosis not present

## 2021-03-02 DIAGNOSIS — D539 Nutritional anemia, unspecified: Secondary | ICD-10-CM | POA: Diagnosis present

## 2021-03-02 DIAGNOSIS — R55 Syncope and collapse: Secondary | ICD-10-CM | POA: Diagnosis not present

## 2021-03-02 DIAGNOSIS — Z8616 Personal history of COVID-19: Secondary | ICD-10-CM | POA: Diagnosis not present

## 2021-03-02 DIAGNOSIS — I2699 Other pulmonary embolism without acute cor pulmonale: Secondary | ICD-10-CM | POA: Diagnosis not present

## 2021-03-02 DIAGNOSIS — J189 Pneumonia, unspecified organism: Secondary | ICD-10-CM | POA: Diagnosis not present

## 2021-03-02 DIAGNOSIS — E1142 Type 2 diabetes mellitus with diabetic polyneuropathy: Secondary | ICD-10-CM | POA: Diagnosis present

## 2021-03-02 DIAGNOSIS — R109 Unspecified abdominal pain: Secondary | ICD-10-CM

## 2021-03-02 DIAGNOSIS — Z7984 Long term (current) use of oral hypoglycemic drugs: Secondary | ICD-10-CM | POA: Diagnosis not present

## 2021-03-02 DIAGNOSIS — D72828 Other elevated white blood cell count: Secondary | ICD-10-CM | POA: Diagnosis present

## 2021-03-02 DIAGNOSIS — G2581 Restless legs syndrome: Secondary | ICD-10-CM | POA: Diagnosis present

## 2021-03-02 LAB — BASIC METABOLIC PANEL
Anion gap: 8 (ref 5–15)
BUN: 9 mg/dL (ref 6–20)
CO2: 31 mmol/L (ref 22–32)
Calcium: 8.7 mg/dL — ABNORMAL LOW (ref 8.9–10.3)
Chloride: 100 mmol/L (ref 98–111)
Creatinine, Ser: 1.39 mg/dL — ABNORMAL HIGH (ref 0.44–1.00)
GFR, Estimated: 44 mL/min — ABNORMAL LOW (ref >60)
Glucose, Bld: 112 mg/dL — ABNORMAL HIGH (ref 70–99)
Potassium: 4.6 mmol/L (ref 3.5–5.1)
Sodium: 139 mmol/L (ref 135–145)

## 2021-03-02 LAB — BLOOD GAS, ARTERIAL
Acid-Base Excess: 2.2 mmol/L — ABNORMAL HIGH (ref 0.0–2.0)
Bicarbonate: 25.1 mmol/L (ref 20.0–28.0)
FIO2: 32
O2 Saturation: 97 %
Patient temperature: 37
pCO2 arterial: 66.6 mmHg (ref 32.0–48.0)
pH, Arterial: 7.257 — ABNORMAL LOW (ref 7.350–7.450)
pO2, Arterial: 100 mmHg (ref 83.0–108.0)

## 2021-03-02 LAB — TROPONIN I (HIGH SENSITIVITY)
Troponin I (High Sensitivity): 9 ng/L (ref <18)
Troponin I (High Sensitivity): 11 ng/L (ref <18)

## 2021-03-02 LAB — CBC WITH DIFFERENTIAL/PLATELET
Abs Immature Granulocytes: 0.18 10*3/uL — ABNORMAL HIGH (ref 0.00–0.07)
Basophils Absolute: 0.1 10*3/uL (ref 0.0–0.1)
Basophils Relative: 1 %
Eosinophils Absolute: 0.9 10*3/uL — ABNORMAL HIGH (ref 0.0–0.5)
Eosinophils Relative: 3 %
HCT: 36.1 % (ref 36.0–46.0)
Hemoglobin: 10.5 g/dL — ABNORMAL LOW (ref 12.0–15.0)
Immature Granulocytes: 1 %
Lymphocytes Relative: 8 %
Lymphs Abs: 2.3 10*3/uL (ref 0.7–4.0)
MCH: 30.5 pg (ref 26.0–34.0)
MCHC: 29.1 g/dL — ABNORMAL LOW (ref 30.0–36.0)
MCV: 104.9 fL — ABNORMAL HIGH (ref 80.0–100.0)
Monocytes Absolute: 2.5 10*3/uL — ABNORMAL HIGH (ref 0.1–1.0)
Monocytes Relative: 9 %
Neutro Abs: 21 10*3/uL — ABNORMAL HIGH (ref 1.7–7.7)
Neutrophils Relative %: 78 %
Platelets: 333 10*3/uL (ref 150–400)
RBC: 3.44 MIL/uL — ABNORMAL LOW (ref 3.87–5.11)
RDW: 13.7 % (ref 11.5–15.5)
WBC: 26.9 10*3/uL — ABNORMAL HIGH (ref 4.0–10.5)
nRBC: 0 % (ref 0.0–0.2)

## 2021-03-02 LAB — URINALYSIS, ROUTINE W REFLEX MICROSCOPIC
Bilirubin Urine: NEGATIVE
Glucose, UA: NEGATIVE mg/dL
Hgb urine dipstick: NEGATIVE
Ketones, ur: NEGATIVE mg/dL
Leukocytes,Ua: NEGATIVE
Nitrite: NEGATIVE
Protein, ur: NEGATIVE mg/dL
Specific Gravity, Urine: 1.009 (ref 1.005–1.030)
pH: 6 (ref 5.0–8.0)

## 2021-03-02 LAB — CBG MONITORING, ED: Glucose-Capillary: 101 mg/dL — ABNORMAL HIGH (ref 70–99)

## 2021-03-02 LAB — POC URINE PREG, ED: Preg Test, Ur: NEGATIVE

## 2021-03-02 LAB — GLUCOSE, CAPILLARY: Glucose-Capillary: 102 mg/dL — ABNORMAL HIGH (ref 70–99)

## 2021-03-02 LAB — D-DIMER, QUANTITATIVE: D-Dimer, Quant: 2.08 ug/mL-FEU — ABNORMAL HIGH (ref 0.00–0.50)

## 2021-03-02 MED ORDER — IOHEXOL 350 MG/ML SOLN
75.0000 mL | Freq: Once | INTRAVENOUS | Status: AC | PRN
  Administered 2021-03-02: 75 mL via INTRAVENOUS

## 2021-03-02 MED ORDER — SODIUM CHLORIDE 0.9 % IV SOLN
1.0000 g | INTRAVENOUS | Status: DC
  Administered 2021-03-03: 1 g via INTRAVENOUS
  Filled 2021-03-02: qty 10

## 2021-03-02 MED ORDER — INSULIN ASPART 100 UNIT/ML ~~LOC~~ SOLN: 0.0000 [IU] | Freq: Every day | SUBCUTANEOUS | Status: DC

## 2021-03-02 MED ORDER — LOSARTAN POTASSIUM 50 MG PO TABS
100.0000 mg | ORAL_TABLET | Freq: Every day | ORAL | Status: DC
  Administered 2021-03-02 – 2021-03-04 (×3): 100 mg via ORAL
  Filled 2021-03-02 (×3): qty 2

## 2021-03-02 MED ORDER — UMECLIDINIUM BROMIDE 62.5 MCG/INH IN AEPB
1.0000 | INHALATION_SPRAY | Freq: Every day | RESPIRATORY_TRACT | Status: DC
  Administered 2021-03-03 – 2021-03-04 (×2): 1 via RESPIRATORY_TRACT
  Filled 2021-03-02: qty 7

## 2021-03-02 MED ORDER — SODIUM CHLORIDE 0.9 % IV SOLN
1.0000 g | Freq: Once | INTRAVENOUS | Status: AC
  Administered 2021-03-02: 1 g via INTRAVENOUS
  Filled 2021-03-02: qty 10

## 2021-03-02 MED ORDER — GABAPENTIN 300 MG PO CAPS
ORAL_CAPSULE | ORAL | Status: AC
  Filled 2021-03-02: qty 2

## 2021-03-02 MED ORDER — OXYCODONE-ACETAMINOPHEN 5-325 MG PO TABS
1.0000 | ORAL_TABLET | Freq: Once | ORAL | Status: AC
  Administered 2021-03-02: 1 via ORAL
  Filled 2021-03-02: qty 1

## 2021-03-02 MED ORDER — DM-GUAIFENESIN ER 30-600 MG PO TB12
1.0000 | ORAL_TABLET | Freq: Two times a day (BID) | ORAL | Status: DC
  Administered 2021-03-02 – 2021-03-04 (×4): 1 via ORAL
  Filled 2021-03-02 (×4): qty 1

## 2021-03-02 MED ORDER — IPRATROPIUM-ALBUTEROL 0.5-2.5 (3) MG/3ML IN SOLN
3.0000 mL | RESPIRATORY_TRACT | Status: DC | PRN
  Administered 2021-03-03: 3 mL via RESPIRATORY_TRACT
  Filled 2021-03-02: qty 3

## 2021-03-02 MED ORDER — GABAPENTIN 600 MG PO TABS
600.0000 mg | ORAL_TABLET | Freq: Every day | ORAL | Status: DC
  Administered 2021-03-03: 600 mg via ORAL
  Filled 2021-03-02 (×2): qty 1

## 2021-03-02 MED ORDER — FLUTICASONE-UMECLIDIN-VILANT 100-62.5-25 MCG/INH IN AEPB: 1.0000 | INHALATION_SPRAY | Freq: Every day | RESPIRATORY_TRACT | Status: DC

## 2021-03-02 MED ORDER — ALBUTEROL SULFATE HFA 108 (90 BASE) MCG/ACT IN AERS
4.0000 | INHALATION_SPRAY | Freq: Once | RESPIRATORY_TRACT | Status: AC
  Administered 2021-03-02: 4 via RESPIRATORY_TRACT
  Filled 2021-03-02: qty 6.7

## 2021-03-02 MED ORDER — AZITHROMYCIN 250 MG PO TABS
500.0000 mg | ORAL_TABLET | Freq: Once | ORAL | Status: AC
  Administered 2021-03-02: 500 mg via ORAL
  Filled 2021-03-02: qty 2

## 2021-03-02 MED ORDER — FLUTICASONE FUROATE-VILANTEROL 100-25 MCG/INH IN AEPB
1.0000 | INHALATION_SPRAY | Freq: Every day | RESPIRATORY_TRACT | Status: DC
  Administered 2021-03-03 – 2021-03-04 (×2): 1 via RESPIRATORY_TRACT
  Filled 2021-03-02: qty 28

## 2021-03-02 MED ORDER — METHYLPREDNISOLONE SODIUM SUCC 40 MG IJ SOLR
40.0000 mg | Freq: Two times a day (BID) | INTRAMUSCULAR | Status: DC
  Administered 2021-03-02 – 2021-03-04 (×4): 40 mg via INTRAVENOUS
  Filled 2021-03-02 (×4): qty 1

## 2021-03-02 MED ORDER — IPRATROPIUM BROMIDE 0.06 % NA SOLN
2.0000 | Freq: Four times a day (QID) | NASAL | Status: DC
  Filled 2021-03-02: qty 15

## 2021-03-02 MED ORDER — LEVOTHYROXINE SODIUM 88 MCG PO TABS
88.0000 ug | ORAL_TABLET | Freq: Every day | ORAL | Status: DC
  Administered 2021-03-03 – 2021-03-04 (×2): 88 ug via ORAL
  Filled 2021-03-02 (×2): qty 1

## 2021-03-02 MED ORDER — IPRATROPIUM-ALBUTEROL 0.5-2.5 (3) MG/3ML IN SOLN: 3.0000 mL | Freq: Four times a day (QID) | RESPIRATORY_TRACT | Status: DC

## 2021-03-02 MED ORDER — ENOXAPARIN SODIUM 40 MG/0.4ML ~~LOC~~ SOLN
40.0000 mg | SUBCUTANEOUS | Status: DC
  Administered 2021-03-02 – 2021-03-03 (×2): 40 mg via SUBCUTANEOUS
  Filled 2021-03-02 (×2): qty 0.4

## 2021-03-02 MED ORDER — ONDANSETRON HCL 4 MG/2ML IJ SOLN
4.0000 mg | Freq: Once | INTRAMUSCULAR | Status: AC
  Administered 2021-03-02: 4 mg via INTRAVENOUS
  Filled 2021-03-02: qty 2

## 2021-03-02 MED ORDER — INSULIN ASPART 100 UNIT/ML ~~LOC~~ SOLN
0.0000 [IU] | Freq: Three times a day (TID) | SUBCUTANEOUS | Status: DC
  Administered 2021-03-03 – 2021-03-04 (×3): 1 [IU] via SUBCUTANEOUS

## 2021-03-02 MED ORDER — SODIUM CHLORIDE 0.9 % IV SOLN
500.0000 mg | INTRAVENOUS | Status: DC
  Administered 2021-03-03: 500 mg via INTRAVENOUS
  Filled 2021-03-02: qty 500

## 2021-03-02 MED ORDER — ACETAMINOPHEN 325 MG PO TABS
650.0000 mg | ORAL_TABLET | Freq: Four times a day (QID) | ORAL | Status: DC | PRN
  Administered 2021-03-03: 650 mg via ORAL
  Filled 2021-03-02: qty 2

## 2021-03-02 MED ORDER — ROSUVASTATIN CALCIUM 20 MG PO TABS
20.0000 mg | ORAL_TABLET | Freq: Every day | ORAL | Status: DC
  Administered 2021-03-03 – 2021-03-04 (×2): 20 mg via ORAL
  Filled 2021-03-02 (×2): qty 1

## 2021-03-02 MED ORDER — PANTOPRAZOLE SODIUM 40 MG PO TBEC
40.0000 mg | DELAYED_RELEASE_TABLET | Freq: Two times a day (BID) | ORAL | Status: DC
  Administered 2021-03-02 – 2021-03-04 (×4): 40 mg via ORAL
  Filled 2021-03-02 (×4): qty 1

## 2021-03-02 NOTE — ED Triage Notes (Signed)
Pt states she was in her bathroom, fell hitting her left arm on the counter. Unaware of LOC. Pt took her oxycodone for pain this morning. Also endorses increased sob. EMS arrival, pt was 85% on 2 L [place] that she wear chronically. Placed on NRB.  94 % on 3 L [place] in room

## 2021-03-02 NOTE — ED Provider Notes (Signed)
[facility] EMERGENCY DEPARTMENT Provider Note   CSN: [id number] Arrival date & time: 03/02/21  1205     History Chief Complaint  Patient presents with  . Shortness of Breath  . Fall    Krystal Burgess is a 60 y.o. female with a history significant for diabetes with peripheral neuropathy, COPD on chronic home oxygen at 2 L, chronic kidney disease, hypertension who had COVID-19 early February and went through outpatient remdesivir treatments for this, presenting for increasing shortness of breath over the past week.  She states she never really bounced back from the COVID-19 and feels generalized fatigue along with shortness of breath despite using her home oxygen.  This morning she was in the bathroom when she became lightheaded and fell, possibly with LOC.  She does endorse feeling lightheaded briefly prior to this event and denies chest palpitations, nausea or vomiting, diaphoresis prior to the event.  She denies headache currently, denies pain from this fall, states hit her left arm on the counter but has minimal discomfort at this location currently.  When EMS arrived she was 85% on her 2 L.  He does endorse increased cough which has been productive of a liquid sputum with occasional blood streaks.  She denies chest pain, nausea or vomiting, abdominal pain or any other complaints.  HPI     Past Medical History:  Diagnosis Date  . Allergy   . Hyperlipidemia   . Pain   . Thyroid disease     Patient Active Problem List   Diagnosis Date Noted  . RLS (restless legs syndrome) 01/14/2019  . Sinus tachycardia 04/08/2018  . Hypernatremia 04/08/2018  . Chronic ethmoidal sinusitis 01/29/2018  . CKD (chronic kidney disease) stage 3, GFR 30-59 ml/min (HCC) 10/26/2017  . Morbid obesity with BMI of 50.0-59.9, adult (HCC) 10/26/2017  . Cutaneous candidiasis 10/24/2017  . Chronic back pain 10/24/2017  . Chronic respiratory failure with hypoxia (HCC) 10/23/2017  . Hypomagnesemia 10/23/2017   . Glaucoma 10/23/2017  . COPD exacerbation (HCC) 09/09/2017  . Chronic insomnia 01/30/2017  . Pulmonary nodule, RML, needs noncontrast CT in February 2019 01/26/2017  . Lumbar degenerative disc disease 08/14/2016  . Combined forms of age-related cataract of 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  . 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 Lower GI bleed 10/18/2014  . Gastroesophageal reflux disease without esophagitis 10/12/2014  . Diabetic peripheral neuropathy associated with type 2 diabetes mellitus (HCC) 10/12/2014  . 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  . Acute respiratory failure with hypoxia and hypercapnia (HCC) 10/09/2014  . Leukocytosis 10/08/2014  . History of anxiety 10/08/2014  . Diabetes type 2, controlled (HCC) 09/07/2014  . Acute angle-closure glaucoma 10/03/2013  . Bilateral carpal tunnel syndrome 03/14/2013  . Bipolar disorder (HCC) 01/24/2013  . 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 Vitamin B 12 deficiency 06/24/2011  . ALLERGIC RHINITIS 08/17/2009  . UNSPECIFIED VITAMIN D DEFICIENCY 07/13/2009  . Severe obesity (BMI >= 40) (HCC) 05/03/2009  . HYPERTENSION, BENIGN 03/26/2009  . LEG EDEMA, BILATERAL 03/26/2009  . Hypothyroidism 02/20/2009  . HYPERLIPIDEMIA 12/12/2008  . Former smoker 12/12/2008  . DEPRESSION 12/12/2008  . Mononeuritis 12/12/2008  . 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 COPD (chronic obstructive pulmonary disease) with chronic bronchitis (HCC) 12/12/2008  . Spinal stenosis of lumbar region 12/12/2008    Past Surgical History:  Procedure Laterality Date  . ABDOMINAL HYSTERECTOMY    . CESAREAN SECTION    . CHOLECYSTECTOMY    . lipoma removal  RT shoulder  . LUMBAR SPINE SURGERY  2013   Dr. Manson Passey, L4-5  . pilonydal cyst  1994     OB History   No obstetric history on file.     Family History  Problem Relation Age of Onset  . Diabetes Brother   . Depression Brother        bipolar  . Cancer Brother         throat  . Diabetes Other        grandmother  . Depression Mother   . Hyperlipidemia Mother   . Hypertension Mother   . Pancreatic cancer Mother   . Hypertension Father     Social History   Tobacco Use  . Smoking status: Former Smoker    Packs/day: 1.00    Quit date: 03/19/2020    Years since quitting: 0.9  . Smokeless tobacco: Never Used  Substance Use Topics  . Alcohol use: Not Currently  . Drug use: No    Home Medications Prior to Admission medications   Medication Sig Start Date End Date Taking? Authorizing Provider  albuterol (VENTOLIN HFA) 108 (90 Base) MCG/ACT inhaler Inhale 2 puffs into the lungs every 4 (four) hours as needed for wheezing or shortness of breath. 01/01/21  Yes Early, Sung Amabile, NP  ALPRAZolam Prudy Feeler) 1 MG tablet Take 1 mg by mouth in the morning, at noon, and at bedtime.   Yes [provider]  amLODipine (NORVASC) 5 MG tablet Take 1 tablet (5 mg total) by mouth daily. 01/01/21  Yes Early, Sung Amabile, NP  diclofenac sodium (VOLTAREN) 1 % GEL Apply 4 g topically 4 (four) times daily as needed (pain). 01/22/18  Yes [provider]  fluticasone (FLONASE) 50 MCG/ACT nasal spray SPRAY 2 SPRAYS INTO EACH NOSTRIL EVERY DAY Patient taking differently: Place 2 sprays into both nostrils daily. 01/01/21  Yes Early, Sung Amabile, NP  Fluticasone-Umeclidin-Vilant (TRELEGY ELLIPTA) 100-62.5-25 MCG/INH AEPB Inhale 1 puff into the lungs daily. 01/01/21  Yes Early, Sung Amabile, NP  gabapentin (NEURONTIN) 600 MG tablet Take 1 tablet (600 mg total) by mouth at bedtime. 01/01/21  Yes Early, Sung Amabile, NP  ipratropium (ATROVENT) 0.06 % nasal spray Place 2 sprays into both nostrils 4 (four) times daily. 01/01/21  Yes Early, Sung Amabile, NP  ipratropium-albuterol (DUONEB) 0.5-2.5 (3) MG/3ML SOLN TAKE 3 MLS BY NEBULIZATION EVERY 2 (TWO) HOURS WHILE AWAKE. Patient taking differently: Inhale 3 mLs into the lungs every 2 (two) hours as needed (wheezing or shortness of breath). 01/01/21  Yes Early,  Sung Amabile, NP  JANUVIA 100 MG tablet TAKE 1 TABLET BY MOUTH EVERY DAY Patient taking differently: Take 100 mg by mouth daily. 08/23/20  Yes Agapito Games, MD  levocetirizine (XYZAL) 5 MG tablet Take 1 tablet (5 mg total) by mouth every evening. 01/01/21  Yes Early, Sung Amabile, NP  levothyroxine (SYNTHROID) 88 MCG tablet Take 1 tablet (88 mcg total) by mouth daily. 01/01/21  Yes Early, Sung Amabile, NP  losartan (COZAAR) 100 MG tablet Take 1 tablet (100 mg total) by mouth daily. 01/01/21  Yes Early, Sung Amabile, NP  Multiple Vitamins-Minerals (MULTIVITAMIN WITH MINERALS) tablet Take 1 tablet by mouth daily. Breeze MD vit. For respiratory health   Yes [provider]  naproxen (NAPROSYN) 500 MG tablet TAKE 1 TABLET BY MOUTH 2 TIMES DAILY WITH A MEAL. Patient taking differently: Take 500 mg by mouth 2 (two) times daily with a meal. 02/25/21  Yes Agapito Games, MD  oxyCODONE-acetaminophen [facility])  7.5-325 MG tablet Take 1 tablet by mouth every 6 (six) hours as needed for moderate pain. 03/02/20  Yes [provider]  OXYGEN Inhale 4 L into the lungs. 03/14/16  Yes [provider]  pantoprazole (PROTONIX) 40 MG tablet Take 1 tablet (40 mg total) by mouth 2 (two) times daily. 01/01/21  Yes Early, Sung Amabile, NP  predniSONE (DELTASONE) 5 MG tablet Take 5 mg by mouth daily with breakfast.   Yes [provider]  promethazine (PHENERGAN) 25 MG tablet TAKE 1 TABLET BY MOUTH 2 TIMES A DAY AS NEEDED FOR NAUSEA OR VOMITING Patient taking differently: Take 25 mg by mouth every 12 (twelve) hours as needed for nausea or vomiting. 02/24/21  Yes Agapito Games, MD  rosuvastatin (CRESTOR) 20 MG tablet Take 1 tablet (20 mg total) by mouth daily. 01/01/21  Yes Early, Sung Amabile, NP  sertraline (ZOLOFT) 100 MG tablet Take 100 mg by mouth daily. 05/28/20  Yes [provider]  theophylline (THEODUR) 300 MG 12 hr tablet TAKE 1 TABLET (300 MG TOTAL) BY MOUTH 2 (TWO) TIMES DAILY. 12/25/20  Yes  Agapito Games, MD  tiZANidine (ZANAFLEX) 4 MG tablet TAKE 1 TABLET BY MOUTH TWICE A DAY AS NEEDED MUSCLE SPASMS Patient taking differently: Take 4 mg by mouth every 12 (twelve) hours as needed for muscle spasms. 02/25/21  Yes Agapito Games, MD  traZODone (DESYREL) 100 MG tablet Take 100 mg by mouth at bedtime. 10/22/20  Yes Shelly Coss, NP  AMBULATORY NON FORMULARY MEDICATION Medication Name: ONE TOUCH METER. CHECK BLOOD SUGAR TWICE DAILY. DX:E11.9 10/22/17   Agapito Games, MD  AMBULATORY NON FORMULARY MEDICATION Medication Name: Portable oxygen concentrator with tubing and supplies. Dx: COPD  Fax to [facility].  Dx Severe COPD 05/15/20   Agapito Games, MD  AMBULATORY NON FORMULARY MEDICATION Medication Name: portable battery O2 concentrator 12/03/20   Agapito Games, MD  AMBULATORY NON FORMULARY MEDICATION Humidifier to use with oxygen Patient not taking: No sig reported 01/03/21   Agapito Games, MD  doxycycline (VIBRA-TABS) 100 MG tablet Take 1 tablet (100 mg total) by mouth 2 (two) times daily. Patient not taking: Reported on 03/02/2021 01/21/21   Agapito Games, MD  glucose blood test strip TEST TWICE A DAY. DX: E11.9 01/10/19   Agapito Games, MD  mupirocin ointment (BACTROBAN) 2 % APPLY TO INSIDE OF EACH NARES DAILY FOR 10 DAYS THEN TWICE A WEEK FOR MAINTENANCE. Patient not taking: Reported on 03/02/2021 02/25/21   Agapito Games, MD  [facility] DELICA LANCETS 33G MISC USE TWICE DAILY. DX: E11.9 01/10/19   Agapito Games, MD  predniSONE (DELTASONE) 20 MG tablet Take 2 tablets (40 mg total) by mouth daily with breakfast. Patient not taking: Reported on 03/02/2021 01/21/21   Agapito Games, MD    Allergies    Risperidone and related, Aripiprazole, Fluoxetine hcl, Nsaids, Paroxetine, Ziprasidone hcl, Chantix [varenicline tartrate], Metformin and related, Montelukast, Onglyza [saxagliptin], Augmentin [amoxicillin-pot  clavulanate], and Fluoxetine hcl  Review of Systems   Review of Systems  Constitutional: Negative for chills and fever.  HENT: Negative for congestion.   Eyes: Negative.   Respiratory: Positive for cough and shortness of breath. Negative for chest tightness.   Cardiovascular: Negative for chest pain, palpitations and leg swelling.  Gastrointestinal: Negative for abdominal pain, nausea and vomiting.  Genitourinary: Negative.   Musculoskeletal: Negative for arthralgias, joint swelling and neck pain.  Skin: Negative.  Negative for rash and  wound.  Neurological: Positive for syncope. Negative for dizziness, weakness, light-headedness, numbness and headaches.  Psychiatric/Behavioral: Negative.     Physical Exam Updated Vital Signs BP 131/80   Pulse 89   Temp 98.4 F (36.9 C) (Oral)   Resp (!) 22   Ht 5\' 4"  (1.626 m)   Wt 117.9 kg   SpO2 92%   BMI 44.63 kg/m   Physical Exam Vitals and nursing note reviewed.  Constitutional:      Appearance: She is well-developed.  HENT:     Head: Normocephalic and atraumatic.  Eyes:     Conjunctiva/sclera: Conjunctivae normal.  Cardiovascular:     Rate and Rhythm: Normal rate and regular rhythm.     Heart sounds: Normal heart sounds.  Pulmonary:     Effort: Pulmonary effort is normal.     Breath sounds: Examination of the right-upper field reveals wheezing. Examination of the left-upper field reveals wheezing. Wheezing present.     Comments: Expiratory wheeze heard best anterior upper lung fields,  Prolonged expirations. Abdominal:     General: Bowel sounds are normal.     Palpations: Abdomen is soft.     Tenderness: There is no abdominal tenderness.  Musculoskeletal:        General: Normal range of motion.     Cervical back: Normal range of motion.  Skin:    General: Skin is warm and dry.  Neurological:     Mental Status: She is alert.     ED Results / Procedures / Treatments   Labs (all labs ordered are listed, but only  abnormal results are displayed) Labs Reviewed  BASIC METABOLIC PANEL - Abnormal; Notable for the following components:      Result Value   Glucose, Bld 112 (*)    Creatinine, Ser 1.39 (*)    Calcium 8.7 (*)    GFR, Estimated 44 (*)    All other components within normal limits  CBC WITH DIFFERENTIAL/PLATELET - Abnormal; Notable for the following components:   WBC 26.9 (*)    RBC 3.44 (*)    Hemoglobin 10.5 (*)    MCV 104.9 (*)    MCHC 29.1 (*)    Neutro Abs 21.0 (*)    Monocytes Absolute 2.5 (*)    Eosinophils Absolute 0.9 (*)    Abs Immature Granulocytes 0.18 (*)    All other components within normal limits  D-DIMER, QUANTITATIVE - Abnormal; Notable for the following components:   D-Dimer, Quant 2.08 (*)    All other components within normal limits  CBG MONITORING, ED - Abnormal; Notable for the following components:   Glucose-Capillary 101 (*)    All other components within normal limits  URINALYSIS, ROUTINE W REFLEX MICROSCOPIC  POC URINE PREG, ED  TROPONIN I (HIGH SENSITIVITY)  TROPONIN I (HIGH SENSITIVITY)    EKG EKG Interpretation  Date/Time:  Saturday March 02 2021 12:15:00 EST Ventricular Rate:  103 PR Interval:    QRS Duration: 75 QT Interval:  329 QTC Calculation: 431 R Axis:   -25 Text Interpretation: Sinus tachycardia Probable left atrial enlargement Borderline left axis deviation Low voltage, precordial leads No old tracing to compare Confirmed by Susy FrizzleSheldon, Charles [phone] on 03/02/2021 6:57:37 PM   Radiology CT Head Wo Contrast  Result Date: 03/02/2021 CLINICAL DATA:  Syncope, shortness of breath, elevated D-dimer EXAM: CT HEAD WITHOUT CONTRAST TECHNIQUE: Contiguous axial images were obtained from the base of the skull through the vertex without intravenous contrast. COMPARISON:  None. FINDINGS: Brain: No evidence of  acute infarction, hemorrhage, hydrocephalus, extra-axial collection or mass lesion/mass effect. Vascular: No hyperdense vessel or unexpected  calcification. Skull: Normal. Negative for fracture or focal lesion. Sinuses/Orbits: No acute finding. Other: None. IMPRESSION: No acute intracranial pathology. Electronically Signed   By: Lauralyn Primes M.D.   On: 03/02/2021 15:59   CT Angio Chest PE W and/or Wo Contrast  Result Date: 03/02/2021 CLINICAL DATA:  Syncope, rule out PE, recent COVID in February EXAM: CT ANGIOGRAPHY CHEST WITH CONTRAST TECHNIQUE: Multidetector CT imaging of the chest was performed using the standard protocol during bolus administration of intravenous contrast. Multiplanar CT image reconstructions and MIPs were obtained to evaluate the vascular anatomy. CONTRAST:  75mL OMNIPAQUE IOHEXOL 350 MG/ML SOLN COMPARISON:  CT chest, 01/23/2017 FINDINGS: Cardiovascular: Satisfactory opacification of the pulmonary arteries to the segmental level. No evidence of pulmonary embolism. Normal heart size. No pericardial effusion. Left coronary artery calcifications. Aortic atherosclerosis. Mediastinum/Nodes: There are newly enlarged pretracheal, subcarinal, and right hilar lymph nodes, measuring up to 2.8 x 1.9 cm (series 4, image 32). Thyroid gland, trachea, and esophagus demonstrate no significant findings. Lungs/Pleura: There is clustered centrilobular nodular and heterogeneous airspace opacity, predominantly in the right lung, although seen throughout, with a more dense consolidation of the peripheral posterior right upper lobe (series 6, image 58). No pleural effusion or pneumothorax. Upper Abdomen: No acute abnormality. Musculoskeletal: No chest wall abnormality. No acute or significant osseous findings. Review of the MIP images confirms the above findings. IMPRESSION: 1. Negative examination for pulmonary embolism. 2. There is clustered centrilobular nodular and heterogeneous airspace opacity, predominantly in the right lung, although seen throughout, with a more dense consolidation of the peripheral posterior right upper lobe. Findings are  consistent with multifocal infection. Recommend follow-up CT in 3 months to ensure complete resolution and exclude underlying malignancy. 3. There are newly enlarged pretracheal, subcarinal, and right hilar lymph nodes, nonspecific although likely reactive. 4. Coronary artery disease. Aortic Atherosclerosis (ICD10-I70.0). Electronically Signed   By: Lauralyn Primes M.D.   On: 03/02/2021 15:58   DG Chest Port 1 View  Result Date: 03/02/2021 CLINICAL DATA:  Shortness of breath and syncope EXAM: PORTABLE CHEST 1 VIEW COMPARISON:  December 12, 2016 FINDINGS: Mild scarring left base. Focus of ill-defined opacity lateral right mid lung adjacent to the minor fissure on the right. Otherwise no edema or airspace opacity. Heart is borderline enlarged with pulmonary vascularity normal. No adenopathy. No bone lesions. IMPRESSION: Mild scarring left base. Focus of opacity in the periphery of the right mid lung adjacent to the minor fissure. A small focus of infiltrate must be of concern given this appearance. Lungs elsewhere clear. Borderline cardiac enlargement. No adenopathy appreciable. Electronically Signed   By: Bretta Bang III M.D.   On: 03/02/2021 15:01    Procedures Procedures   Medications Ordered in ED Medications  ondansetron (ZOFRAN) injection 4 mg (has no administration in time range)  oxyCODONE-acetaminophen (PERCOCET/ROXICET) 5-325 MG per tablet 1 tablet (has no administration in time range)  albuterol (VENTOLIN HFA) 108 (90 Base) MCG/ACT inhaler 4 puff (4 puffs Inhalation Given 03/02/21 1430)  iohexol (OMNIPAQUE) 350 MG/ML injection 75 mL (75 mLs Intravenous Contrast Given 03/02/21 1536)  cefTRIAXone (ROCEPHIN) 1 g in sodium chloride 0.9 % 100 mL IVPB (0 g Intravenous Stopped 03/02/21 1710)  azithromycin (ZITHROMAX) tablet 500 mg (500 mg Oral Given 03/02/21 1635)    ED Course  I have reviewed the triage vital signs and the nursing notes.  Pertinent labs & imaging results that were  available  during my care of the patient were reviewed by me and considered in my medical decision making (see chart for details).    MDM Rules/Calculators/A&P                           Information from daughter who is now at the bedside and lives with her mother.  She states that over the past week she has been having increased drowsiness and has had several falls, almost she states she had increased falls when she was actively being treated for Covid as well.  The last time she had problems with drowsiness she had acute renal failure.  She did witness her mother fall 1 time today where she "face planted, her walker flew out in front of her and she landed forward into her bathroom floor.  She possibly hit her head.  CT imaging of her head added to rule out intracranial injury.  Labs obtained at this point including an elevated D-dimer, CT imaging to rule out PE has also been obtained.  Pt given albuterol mdi with resolved wheezing.  Pt with CAP, tx with IV rocephin and zithromax PO.  Pt motivated to go home.  Attempted to ambulate pt in room using her home O2 at 2L,  Her oxygen saturation dropped to 83% and became very dyspneic.  She does agree to admission.    Discussed with Dr. Thomes Dinning who accepts pt for admission.  Asked for abg which has been ordered.      Final Clinical Impression(s) / ED Diagnoses Final diagnoses:  Community acquired pneumonia of right middle lobe of lung  Hypoxia    Rx / DC Orders ED Discharge Orders    None       Victoriano Lain 03/02/21 2001    Rozelle Logan, DO 03/03/21 0719

## 2021-03-02 NOTE — Progress Notes (Incomplete)
Date and time results received: 03/02/21 *** (use smartphrase ".now" to insert current time)  Test: ABG Critical Value: pCO2=66.6  Name of Provider Notified: Dr. Thomes Dinning  Orders Received? Or Actions Taken?:

## 2021-03-02 NOTE — ED Notes (Signed)
Pt is sitting up in bed on personal computer.

## 2021-03-02 NOTE — H&P (Addendum)
History and Physical  Krystal Memsatricia W Darsey ONG:[id number] DOB: 1961/08/08 DOA: 03/02/2021  Referring physician: Burgess AmorIdol, Julie, PA-C PCP: Agapito GamesMetheney, Catherine D, MD  Patient coming from: Home  Chief Complaint: Shortness of breath, Fall  HPI: Krystal Burgess is a 60 y.o. female with medical history significant for hypertension, hyperlipidemia, hypothyroidism, T2DM, chronic respiratory failure with hypoxia due to COPD (on supplemental oxygen at 2 LPM), CKD stage IIIb, morbid obesity and recent COVID-19 viral infection (in early February of this year) treated with outpatient remdesivir who presents to the emergency department via EMS due to 1 week onset of increasing shortness of breath.  Patient ambulates with a walker at baseline and lives with daughter who provides care for her.  This morning, she went to the bathroom and she felt a left lower quadrant pain which she thought was due to her ovary, she states that she felt lightheaded and fell without losing consciousness, daughter quickly came to her aid and activated EMS, on arrival of EMS team, she was noted to have an O2 sat of 85% on home 2 LPM of oxygen, patient complained of increased cough with occasional clear thick and sometimes liquidy sputum occasional blood-streaked.  She complained of headache, but denies fever, chills, chest pain, nausea or vomiting.  ED Course: In the emergency department, she was hemodynamically stable.  Supplemental oxygen was increased to 3 LPM to obtain an O2 sat of 92 to 97%.  Work-up in the ED showed leukocytosis, macrocytic anemia, BUN/creatinine 9/1.39 (baseline creatinine at 1.1-1.4).  Urinalysis was unimpressive for UTI, D-dimer 2.08, troponin x2- 9 > 11. CT of head without contrast showed no acute intracranial pathology. CT angiography of chest with contrast was negative for pulmonary embolism but suspicious for multifocal infection Chest x-ray showed mild scarring of left base.  Focus of opacity in the periphery of  the right midlung nodular to the minor fissure.  A small focus of infiltrate mostly of concern given this appearance. She was treated with IV ceftriaxone and azithromycin and patient was provided with Ventolin treatment.  Zofran and Percocet were also given.  Hospitalist was asked to admit patient for further evaluation and management.  Review of Systems: Constitutional: Negative for chills and fever.  HENT: Negative for ear pain and sore throat.   Eyes: Negative for pain and visual disturbance.  Respiratory: Positive for cough and shortness of breath.   Cardiovascular: Negative for chest pain and palpitations.  Gastrointestinal: Positive for left lower quadrant abdominal pain .  Negative for vomiting.  Endocrine: Negative for polyphagia and polyuria.  Genitourinary: Negative for decreased urine volume, dysuria, enuresis, hematuria Musculoskeletal: Negative for arthralgias and back pain.  Skin: Negative for color change and rash.  Allergic/Immunologic: Negative for immunocompromised state.  Neurological: Positive for headache.  Negative for tremors, syncope, speech difficulty, weakness Hematological: Does not bruise/bleed easily.  All other systems reviewed and are negative  Past Medical History:  Diagnosis Date  . Allergy   . Hyperlipidemia   . Pain   . Thyroid disease    Past Surgical History:  Procedure Laterality Date  . ABDOMINAL HYSTERECTOMY    . CESAREAN SECTION    . CHOLECYSTECTOMY    . lipoma removal     RT shoulder  . LUMBAR SPINE SURGERY  2013   Dr. Manson PasseyBrown, L4-5  . pilonydal cyst  1994    Social History:  reports that she quit smoking about a year ago. She smoked 1.00 pack per day. She has never used smokeless tobacco. She  reports previous alcohol use. She reports that she does not use drugs.   Allergies  Allergen Reactions  . Risperidone And Related Shortness Of Breath  . Aripiprazole Other (See Comments)    REACTION: muscle jerks   . Fluoxetine Hcl Other  (See Comments)    REACTION: Intolerant  . Nsaids Other (See Comments)    Upper GI bleed   . Paroxetine Other (See Comments)    REACTION: Intolerance   . Ziprasidone Hcl Other (See Comments)    shakes   . Chantix [Varenicline Tartrate] Other (See Comments)    "messing with my mood"  . Metformin And Related Nausea Only  . Montelukast Other (See Comments)    "it kept me sick with an upper respiratory infection"  . Onglyza [Saxagliptin] Nausea Only  . Augmentin [Amoxicillin-Pot Clavulanate] Other (See Comments)    Gets yeast infection every time   . Fluoxetine Hcl     REACTION: Intolerant    Family History  Problem Relation Age of Onset  . Diabetes Brother   . Depression Brother        bipolar  . Cancer Brother        throat  . Diabetes Other        grandmother  . Depression Mother   . Hyperlipidemia Mother   . Hypertension Mother   . Pancreatic cancer Mother   . Hypertension Father      Prior to Admission medications   Medication Sig Start Date End Date Taking? Authorizing Provider  albuterol (VENTOLIN HFA) 108 (90 Base) MCG/ACT inhaler Inhale 2 puffs into the lungs every 4 (four) hours as needed for wheezing or shortness of breath. 01/01/21  Yes Early, Sung Amabile, NP  ALPRAZolam Prudy Feeler) 1 MG tablet Take 1 mg by mouth in the morning, at noon, and at bedtime.   Yes [provider]  amLODipine (NORVASC) 5 MG tablet Take 1 tablet (5 mg total) by mouth daily. 01/01/21  Yes Early, Sung Amabile, NP  diclofenac sodium (VOLTAREN) 1 % GEL Apply 4 g topically 4 (four) times daily as needed (pain). 01/22/18  Yes [provider]  fluticasone (FLONASE) 50 MCG/ACT nasal spray SPRAY 2 SPRAYS INTO EACH NOSTRIL EVERY DAY Patient taking differently: Place 2 sprays into both nostrils daily. 01/01/21  Yes Early, Sung Amabile, NP  Fluticasone-Umeclidin-Vilant (TRELEGY ELLIPTA) 100-62.5-25 MCG/INH AEPB Inhale 1 puff into the lungs daily. 01/01/21  Yes Early, Sung Amabile, NP  gabapentin (NEURONTIN) 600  MG tablet Take 1 tablet (600 mg total) by mouth at bedtime. 01/01/21  Yes Early, Sung Amabile, NP  ipratropium (ATROVENT) 0.06 % nasal spray Place 2 sprays into both nostrils 4 (four) times daily. 01/01/21  Yes Early, Sung Amabile, NP  ipratropium-albuterol (DUONEB) 0.5-2.5 (3) MG/3ML SOLN TAKE 3 MLS BY NEBULIZATION EVERY 2 (TWO) HOURS WHILE AWAKE. Patient taking differently: Inhale 3 mLs into the lungs every 2 (two) hours as needed (wheezing or shortness of breath). 01/01/21  Yes Early, Sung Amabile, NP  JANUVIA 100 MG tablet TAKE 1 TABLET BY MOUTH EVERY DAY Patient taking differently: Take 100 mg by mouth daily. 08/23/20  Yes Agapito Games, MD  levocetirizine (XYZAL) 5 MG tablet Take 1 tablet (5 mg total) by mouth every evening. 01/01/21  Yes Early, Sung Amabile, NP  levothyroxine (SYNTHROID) 88 MCG tablet Take 1 tablet (88 mcg total) by mouth daily. 01/01/21  Yes Early, Sung Amabile, NP  losartan (COZAAR) 100 MG tablet Take 1 tablet (100 mg total) by mouth daily. 01/01/21  Yes Early, Sung Amabile, NP  Multiple Vitamins-Minerals (MULTIVITAMIN WITH MINERALS) tablet Take 1 tablet by mouth daily. Breeze MD vit. For respiratory health   Yes [provider]  naproxen (NAPROSYN) 500 MG tablet TAKE 1 TABLET BY MOUTH 2 TIMES DAILY WITH A MEAL. Patient taking differently: Take 500 mg by mouth 2 (two) times daily with a meal. 02/25/21  Yes Agapito Games, MD  oxyCODONE-acetaminophen (PERCOCET) 7.5-325 MG tablet Take 1 tablet by mouth every 6 (six) hours as needed for moderate pain. 03/02/20  Yes [provider]  OXYGEN Inhale 4 L into the lungs. 03/14/16  Yes [provider]  pantoprazole (PROTONIX) 40 MG tablet Take 1 tablet (40 mg total) by mouth 2 (two) times daily. 01/01/21  Yes Early, Sung Amabile, NP  predniSONE (DELTASONE) 5 MG tablet Take 5 mg by mouth daily with breakfast.   Yes [provider]  promethazine (PHENERGAN) 25 MG tablet TAKE 1 TABLET BY MOUTH 2 TIMES A DAY AS NEEDED FOR NAUSEA OR  VOMITING Patient taking differently: Take 25 mg by mouth every 12 (twelve) hours as needed for nausea or vomiting. 02/24/21  Yes Agapito Games, MD  rosuvastatin (CRESTOR) 20 MG tablet Take 1 tablet (20 mg total) by mouth daily. 01/01/21  Yes Early, Sung Amabile, NP  sertraline (ZOLOFT) 100 MG tablet Take 100 mg by mouth daily. 05/28/20  Yes [provider]  theophylline (THEODUR) 300 MG 12 hr tablet TAKE 1 TABLET (300 MG TOTAL) BY MOUTH 2 (TWO) TIMES DAILY. 12/25/20  Yes Agapito Games, MD  tiZANidine (ZANAFLEX) 4 MG tablet TAKE 1 TABLET BY MOUTH TWICE A DAY AS NEEDED MUSCLE SPASMS Patient taking differently: Take 4 mg by mouth every 12 (twelve) hours as needed for muscle spasms. 02/25/21  Yes Agapito Games, MD  traZODone (DESYREL) 100 MG tablet Take 100 mg by mouth at bedtime. 10/22/20  Yes Shelly Coss, NP  AMBULATORY NON FORMULARY MEDICATION Medication Name: ONE TOUCH METER. CHECK BLOOD SUGAR TWICE DAILY. DX:E11.9 10/22/17   Agapito Games, MD  AMBULATORY NON FORMULARY MEDICATION Medication Name: Portable oxygen concentrator with tubing and supplies. Dx: COPD  Fax to [facility].  Dx Severe COPD 05/15/20   Agapito Games, MD  AMBULATORY NON FORMULARY MEDICATION Medication Name: portable battery O2 concentrator 12/03/20   Agapito Games, MD  AMBULATORY NON FORMULARY MEDICATION Humidifier to use with oxygen Patient not taking: No sig reported 01/03/21   Agapito Games, MD  doxycycline (VIBRA-TABS) 100 MG tablet Take 1 tablet (100 mg total) by mouth 2 (two) times daily. Patient not taking: Reported on 03/02/2021 01/21/21   Agapito Games, MD  glucose blood test strip TEST TWICE A DAY. DX: E11.9 01/10/19   Agapito Games, MD  mupirocin ointment (BACTROBAN) 2 % APPLY TO INSIDE OF EACH NARES DAILY FOR 10 DAYS THEN TWICE A WEEK FOR MAINTENANCE. Patient not taking: Reported on 03/02/2021 02/25/21   Agapito Games, MD  [facility] DELICA  LANCETS 33G MISC USE TWICE DAILY. DX: E11.9 01/10/19   Agapito Games, MD  predniSONE (DELTASONE) 20 MG tablet Take 2 tablets (40 mg total) by mouth daily with breakfast. Patient not taking: Reported on 03/02/2021 01/21/21   Agapito Games, MD    Physical Exam: BP 116/60   Pulse 90   Temp 98.4 F (36.9 C) (Oral)   Resp 17   Ht 5\' 4"  (1.626 m)   Wt 117.9 kg   SpO2 96%  BMI 44.63 kg/m   . General: 60 y.o. year-old female well developed well nourished in no acute distress.  Alert and oriented x3. Marland Kitchen HEENT: NCAT, EOMI . Neck: Supple, trachea medial . Cardiovascular: Regular rate and rhythm with no rubs or gallops.  No thyromegaly or JVD noted.  No lower extremity edema. 2/4 pulses in all 4 extremities. Marland Kitchen Respiratory: Decreased breath sounds on auscultation and some wheezing in upper lobes (L > R). . Abdomen: Soft nontender nondistended with normal bowel sounds x4 quadrants. . Muskuloskeletal: No cyanosis, clubbing or edema noted bilaterally . Neuro: CN II-XII intact, sensation, reflexes intact.  No focal neurologic deficit . Skin: No ulcerative lesions noted or rashes . Psychiatry: Judgement and insight appear normal. Mood is appropriate for condition and setting          Labs on Admission:  Basic Metabolic Panel: Recent Labs  Lab 03/02/21 1410  NA 139  K 4.6  CL 100  CO2 31  GLUCOSE 112*  BUN 9  CREATININE 1.39*  CALCIUM 8.7*   Liver Function Tests: No results for input(s): AST, ALT, ALKPHOS, BILITOT, PROT, ALBUMIN in the last 168 hours. No results for input(s): LIPASE, AMYLASE in the last 168 hours. No results for input(s): AMMONIA in the last 168 hours. CBC: Recent Labs  Lab 03/02/21 1410  WBC 26.9*  NEUTROABS 21.0*  HGB 10.5*  HCT 36.1  MCV 104.9*  PLT 333   Cardiac Enzymes: No results for input(s): CKTOTAL, CKMB, CKMBINDEX, TROPONINI in the last 168 hours.  BNP (last 3 results) No results for input(s): BNP in the last 8760 hours.  ProBNP  (last 3 results) No results for input(s): PROBNP in the last 8760 hours.  CBG: Recent Labs  Lab 03/02/21 1405  GLUCAP 101*    Radiological Exams on Admission: CT Head Wo Contrast  Result Date: 03/02/2021 CLINICAL DATA:  Syncope, shortness of breath, elevated D-dimer EXAM: CT HEAD WITHOUT CONTRAST TECHNIQUE: Contiguous axial images were obtained from the base of the skull through the vertex without intravenous contrast. COMPARISON:  None. FINDINGS: Brain: No evidence of acute infarction, hemorrhage, hydrocephalus, extra-axial collection or mass lesion/mass effect. Vascular: No hyperdense vessel or unexpected calcification. Skull: Normal. Negative for fracture or focal lesion. Sinuses/Orbits: No acute finding. Other: None. IMPRESSION: No acute intracranial pathology. Electronically Signed   By: Lauralyn Primes M.D.   On: 03/02/2021 15:59   CT Angio Chest PE W and/or Wo Contrast  Result Date: 03/02/2021 CLINICAL DATA:  Syncope, rule out PE, recent COVID in February EXAM: CT ANGIOGRAPHY CHEST WITH CONTRAST TECHNIQUE: Multidetector CT imaging of the chest was performed using the standard protocol during bolus administration of intravenous contrast. Multiplanar CT image reconstructions and MIPs were obtained to evaluate the vascular anatomy. CONTRAST:  56mL OMNIPAQUE IOHEXOL 350 MG/ML SOLN COMPARISON:  CT chest, 01/23/2017 FINDINGS: Cardiovascular: Satisfactory opacification of the pulmonary arteries to the segmental level. No evidence of pulmonary embolism. Normal heart size. No pericardial effusion. Left coronary artery calcifications. Aortic atherosclerosis. Mediastinum/Nodes: There are newly enlarged pretracheal, subcarinal, and right hilar lymph nodes, measuring up to 2.8 x 1.9 cm (series 4, image 32). Thyroid gland, trachea, and esophagus demonstrate no significant findings. Lungs/Pleura: There is clustered centrilobular nodular and heterogeneous airspace opacity, predominantly in the right lung,  although seen throughout, with a more dense consolidation of the peripheral posterior right upper lobe (series 6, image 58). No pleural effusion or pneumothorax. Upper Abdomen: No acute abnormality. Musculoskeletal: No chest wall abnormality. No acute or significant osseous  findings. Review of the MIP images confirms the above findings. IMPRESSION: 1. Negative examination for pulmonary embolism. 2. There is clustered centrilobular nodular and heterogeneous airspace opacity, predominantly in the right lung, although seen throughout, with a more dense consolidation of the peripheral posterior right upper lobe. Findings are consistent with multifocal infection. Recommend follow-up CT in 3 months to ensure complete resolution and exclude underlying malignancy. 3. There are newly enlarged pretracheal, subcarinal, and right hilar lymph nodes, nonspecific although likely reactive. 4. Coronary artery disease. Aortic Atherosclerosis (ICD10-I70.0). Electronically Signed   By: Lauralyn Primes M.D.   On: 03/02/2021 15:58   DG Chest Port 1 View  Result Date: 03/02/2021 CLINICAL DATA:  Shortness of breath and syncope EXAM: PORTABLE CHEST 1 VIEW COMPARISON:  December 12, 2016 FINDINGS: Mild scarring left base. Focus of ill-defined opacity lateral right mid lung adjacent to the minor fissure on the right. Otherwise no edema or airspace opacity. Heart is borderline enlarged with pulmonary vascularity normal. No adenopathy. No bone lesions. IMPRESSION: Mild scarring left base. Focus of opacity in the periphery of the right mid lung adjacent to the minor fissure. A small focus of infiltrate must be of concern given this appearance. Lungs elsewhere clear. Borderline cardiac enlargement. No adenopathy appreciable. Electronically Signed   By: Bretta Bang III M.D.   On: 03/02/2021 15:01    EKG: I independently viewed the EKG done and my findings are as followed: Sinus tachycardia at a rate of 103 bpm  Assessment/Plan Present  on Admission: . CAP (community acquired pneumonia) . Hypothyroidism . Severe obesity (BMI >= 40) (HCC) . CKD (chronic kidney disease) stage 3, GFR 30-59 ml/min (HCC) . Leukocytosis . Gastroesophageal reflux disease without esophagitis  Principal Problem:   CAP (community acquired pneumonia) Active Problems:   Hypothyroidism   Hyperlipidemia   Severe obesity (BMI >= 40) (HCC)   Essential hypertension   Type 2 diabetes mellitus (HCC)   COPD (chronic obstructive pulmonary disease) (HCC)   CKD (chronic kidney disease) stage 3, GFR 30-59 ml/min (HCC)   Leukocytosis   Gastroesophageal reflux disease without esophagitis   Accidental fall   Acute on chronic respiratory failure with hypoxia (HCC)   Elevated d-dimer   Macrocytic anemia   Peripheral neuropathy  Acute on chronic respiratory failure with hypoxia and hypercapnia possibly due to CAP POA with superimposed acute exacerbation of COPD CT angiography of chest was suggestive of multifocal pneumonia Patient was started on IV ceftriaxone anisomycin, which will continue same at this time with plan to de-escalate based on procalcitonin, blood culture, sputum culture, urine Legionella and strep pneumo Continue Mucinex, incentive spirometry, flutter valve Continue duo nebs, Solu-Medrol Continue home Trelegy, Atrovent Continue Protonix to prevent steroid-induced ulcer Continue BiPAP with plan to wean patient down to supplemental oxygen to maintain O2 sat > 92% as tolerated Repeat ABG few hours after being on BiPAP  OSA on CPAP Continue BiPAP as described above  Accidental fall Patient states that she sustained a fall at home without losing consciousness CT of head without contrast showed no acute intracranial pathology Continue fall precaution, neurochecks Continue PT/OT eval and treat  Left lower quadrant abdominal pain Patient complained of abdominal pain thought to be due to a left ovary prior to lightheadedness that resulted in  accidental fall Percocet 5-325 x 1 was given, continue with Tylenol at this time Consider ultrasound of the ovary if patient continues to complain of pain  Leukocytosis (acute on chronic) WBC 26.9; this has chronically ranged within 12.4-17.0  within last 3 years Continue management as described above for CAP Continue to monitor WBC with morning labs  Elevated D-dimer D-dimer 2.08; CT angiography of chest ruled out pulmonary embolism  Macrocytic Anemia MCV 104.9; folate level and vitamin B12 will be checked  T2DM Continue ISS and hypoglycemic protocol  Hypothyroidism Continue Synthroid  Hyperlipidemia Continue Crestor  GERD Continue Protonix  Essential hypertension Continue Cozaar  CKD stage IIIb BUN/creatinine 9/1.39 (baseline creatinine at 1.1-1.4) Renally adjust medications, avoid nephrotoxic agents/dehydration/hypotension  Peripheral neuropathy Continue gabapentin  Severe obesity (BMI 44.63) Patient was counseled on diet and lifestyle modification  DVT prophylaxis: Lovenox  Code Status: Full code  Family Communication: None at bedside  Disposition Plan:  Patient is from:                        home Anticipated DC to:                   SNF or family members home Anticipated DC date:               2-3 days Anticipated DC barriers:           Inpatient management required considering patient worsening hypoxia on ambulation in the setting of CAP  Consults called: None  Admission status: Inpatient   Frankey Shown MD [facility]  03/02/2021, 8:54 PM

## 2021-03-02 NOTE — ED Notes (Signed)
Pt only able to tolerate standing at bedside. 87% on 2L [place]. Pt sat on BSC, maintaining 87-88% on 2L. Increased to 5L, oxygen saturation 92%. RN aware. Pt back to bed, siderails up, call bell in place.

## 2021-03-02 NOTE — ED Notes (Signed)
Pt able to stand to transfer from bed to [facility] without difficulty.

## 2021-03-03 LAB — APTT: aPTT: 40 seconds — ABNORMAL HIGH (ref 24–36)

## 2021-03-03 LAB — HIV ANTIBODY (ROUTINE TESTING W REFLEX): HIV Screen 4th Generation wRfx: NONREACTIVE

## 2021-03-03 LAB — FOLATE: Folate: 3.6 ng/mL — ABNORMAL LOW (ref >5.9)

## 2021-03-03 LAB — CBC
HCT: 35.1 % — ABNORMAL LOW (ref 36.0–46.0)
Hemoglobin: 10.3 g/dL — ABNORMAL LOW (ref 12.0–15.0)
MCH: 30.5 pg (ref 26.0–34.0)
MCHC: 29.3 g/dL — ABNORMAL LOW (ref 30.0–36.0)
MCV: 103.8 fL — ABNORMAL HIGH (ref 80.0–100.0)
Platelets: 323 10*3/uL (ref 150–400)
RBC: 3.38 MIL/uL — ABNORMAL LOW (ref 3.87–5.11)
RDW: 13.6 % (ref 11.5–15.5)
WBC: 17.7 10*3/uL — ABNORMAL HIGH (ref 4.0–10.5)
nRBC: 0 % (ref 0.0–0.2)

## 2021-03-03 LAB — STREP PNEUMONIAE URINARY ANTIGEN: Strep Pneumo Urinary Antigen: NEGATIVE

## 2021-03-03 LAB — GLUCOSE, CAPILLARY
Glucose-Capillary: 133 mg/dL — ABNORMAL HIGH (ref 70–99)
Glucose-Capillary: 119 mg/dL — ABNORMAL HIGH (ref 70–99)
Glucose-Capillary: 145 mg/dL — ABNORMAL HIGH (ref 70–99)
Glucose-Capillary: 127 mg/dL — ABNORMAL HIGH (ref 70–99)

## 2021-03-03 LAB — HEMOGLOBIN A1C
Hgb A1c MFr Bld: 6.1 % — ABNORMAL HIGH (ref 4.8–5.6)
Mean Plasma Glucose: 128.37 mg/dL

## 2021-03-03 LAB — VITAMIN B12: Vitamin B-12: 269 pg/mL (ref 180–914)

## 2021-03-03 LAB — PHOSPHORUS: Phosphorus: 3.5 mg/dL (ref 2.5–4.6)

## 2021-03-03 LAB — COMPREHENSIVE METABOLIC PANEL
ALT: 11 U/L (ref 0–44)
AST: 18 U/L (ref 15–41)
Albumin: 3.1 g/dL — ABNORMAL LOW (ref 3.5–5.0)
Alkaline Phosphatase: 111 U/L (ref 38–126)
Anion gap: 11 (ref 5–15)
BUN: 8 mg/dL (ref 6–20)
CO2: 28 mmol/L (ref 22–32)
Calcium: 8.9 mg/dL (ref 8.9–10.3)
Chloride: 100 mmol/L (ref 98–111)
Creatinine, Ser: 1.27 mg/dL — ABNORMAL HIGH (ref 0.44–1.00)
GFR, Estimated: 49 mL/min — ABNORMAL LOW (ref >60)
Glucose, Bld: 133 mg/dL — ABNORMAL HIGH (ref 70–99)
Potassium: 4.4 mmol/L (ref 3.5–5.1)
Sodium: 139 mmol/L (ref 135–145)
Total Bilirubin: 0.6 mg/dL (ref 0.3–1.2)
Total Protein: 6.9 g/dL (ref 6.5–8.1)

## 2021-03-03 LAB — BLOOD GAS, VENOUS
Acid-Base Excess: 6.4 mmol/L — ABNORMAL HIGH (ref 0.0–2.0)
Bicarbonate: 27.6 mmol/L (ref 20.0–28.0)
FIO2: 32
O2 Saturation: 26.4 %
Patient temperature: 36.5
pCO2, Ven: 64 mmHg — ABNORMAL HIGH (ref 44.0–60.0)
pH, Ven: 7.321 (ref 7.250–7.430)
pO2, Ven: <31 mmHg — CL (ref 32.0–45.0)

## 2021-03-03 LAB — PROCALCITONIN: Procalcitonin: 0.18 ng/mL

## 2021-03-03 LAB — PROTIME-INR
INR: 1.2 (ref 0.8–1.2)
Prothrombin Time: 14.4 seconds (ref 11.4–15.2)

## 2021-03-03 LAB — MAGNESIUM: Magnesium: 2.1 mg/dL (ref 1.7–2.4)

## 2021-03-03 MED ORDER — AMLODIPINE BESYLATE 5 MG PO TABS
5.0000 mg | ORAL_TABLET | Freq: Every day | ORAL | Status: DC
  Administered 2021-03-03 – 2021-03-04 (×2): 5 mg via ORAL
  Filled 2021-03-03 (×2): qty 1

## 2021-03-03 MED ORDER — TRAZODONE HCL 50 MG PO TABS
100.0000 mg | ORAL_TABLET | Freq: Every day | ORAL | Status: DC
  Administered 2021-03-03: 100 mg via ORAL
  Filled 2021-03-03: qty 2

## 2021-03-03 MED ORDER — ALPRAZOLAM 1 MG PO TABS
1.0000 mg | ORAL_TABLET | Freq: Two times a day (BID) | ORAL | Status: DC
  Administered 2021-03-03 (×2): 1 mg via ORAL
  Filled 2021-03-03 (×2): qty 1

## 2021-03-03 MED ORDER — ONDANSETRON HCL 4 MG/2ML IJ SOLN
4.0000 mg | Freq: Four times a day (QID) | INTRAMUSCULAR | Status: DC | PRN
  Administered 2021-03-03: 4 mg via INTRAVENOUS
  Filled 2021-03-03: qty 2

## 2021-03-03 MED ORDER — OXYCODONE-ACETAMINOPHEN 7.5-325 MG PO TABS
1.0000 | ORAL_TABLET | Freq: Four times a day (QID) | ORAL | Status: DC | PRN
Start: 2021-03-03 — End: 2021-03-04
  Administered 2021-03-03 – 2021-03-04 (×3): 1 via ORAL
  Filled 2021-03-03 (×3): qty 1

## 2021-03-03 MED ORDER — SERTRALINE HCL 50 MG PO TABS
100.0000 mg | ORAL_TABLET | Freq: Every day | ORAL | Status: DC
  Administered 2021-03-03: 100 mg via ORAL
  Filled 2021-03-03: qty 2

## 2021-03-03 MED ORDER — ALBUTEROL SULFATE (2.5 MG/3ML) 0.083% IN NEBU
2.5000 mg | INHALATION_SOLUTION | Freq: Three times a day (TID) | RESPIRATORY_TRACT | Status: DC
  Administered 2021-03-03 – 2021-03-04 (×4): 2.5 mg via RESPIRATORY_TRACT
  Filled 2021-03-03 (×4): qty 3

## 2021-03-03 MED ORDER — THEOPHYLLINE ER 300 MG PO TB12
300.0000 mg | ORAL_TABLET | Freq: Two times a day (BID) | ORAL | Status: DC
  Administered 2021-03-03 – 2021-03-04 (×3): 300 mg via ORAL
  Filled 2021-03-03 (×7): qty 1

## 2021-03-03 MED ORDER — GABAPENTIN 300 MG PO CAPS
600.0000 mg | ORAL_CAPSULE | Freq: Every day | ORAL | Status: DC
  Administered 2021-03-03: 600 mg via ORAL
  Filled 2021-03-03: qty 2

## 2021-03-03 MED ORDER — TIZANIDINE HCL 4 MG PO TABS
4.0000 mg | ORAL_TABLET | Freq: Two times a day (BID) | ORAL | Status: DC | PRN
  Administered 2021-03-03: 4 mg via ORAL
  Filled 2021-03-03: qty 1

## 2021-03-03 MED ORDER — GABAPENTIN 600 MG PO TABS: 600.0000 mg | ORAL_TABLET | Freq: Every day | ORAL | Status: DC

## 2021-03-03 MED ORDER — LORATADINE 10 MG PO TABS
10.0000 mg | ORAL_TABLET | Freq: Every evening | ORAL | Status: DC
  Administered 2021-03-03: 10 mg via ORAL
  Filled 2021-03-03: qty 1

## 2021-03-03 MED ORDER — IPRATROPIUM BROMIDE 0.03 % NA SOLN
2.0000 | Freq: Four times a day (QID) | NASAL | Status: DC
  Administered 2021-03-03 – 2021-03-04 (×5): 2 via NASAL
  Filled 2021-03-03: qty 30

## 2021-03-03 MED ORDER — FOLIC ACID 1 MG PO TABS
1.0000 mg | ORAL_TABLET | Freq: Every day | ORAL | Status: DC
  Administered 2021-03-03 – 2021-03-04 (×2): 1 mg via ORAL
  Filled 2021-03-03 (×2): qty 1

## 2021-03-03 NOTE — Plan of Care (Signed)
  Problem: Acute Rehab PT Goals(only PT should resolve) Goal: Pt Will Ambulate Outcome: Progressing Flowsheets (Taken 03/03/2021 1021) Pt will Ambulate:  50 feet  with modified independence  with rolling walker Goal: Pt Will Go Up/Down Stairs Outcome: Progressing Flowsheets (Taken 03/03/2021 1021) Pt will Go Up / Down Stairs:  1-2 stairs  with supervision   10:21 AM, 03/03/21 M. Shary Decamp, PT, DPT Physical Therapist- [place] Office Number: [phone]

## 2021-03-03 NOTE — Progress Notes (Signed)
Pt was unable to tolerate the Bipap last night. I decided to try her in CPAP to see if it made a difference. She is on a pressure of 5 with 8lpm bleed in to maintain a SPO2 of low 90s. I may increase the pressure in a bit to see if it helps to titrate down the O2.  For now she is stable and comfortable.

## 2021-03-03 NOTE — Progress Notes (Signed)
PROGRESS NOTE    Krystal Burgess  RKY:[id number] DOB: 01/22/1961 DOA: 03/02/2021 PCP: Agapito Games, MD   Brief Narrative:   Krystal Burgess is a 60 y.o. female with medical history significant for hypertension, hyperlipidemia, hypothyroidism, T2DM, chronic respiratory failure with hypoxia due to COPD (on supplemental oxygen at 2 LPM), CKD stage IIIb, morbid obesity and recent COVID-19 viral infection (in early February of this year) treated with outpatient remdesivir who presents to the emergency department via EMS due to 1 week onset of increasing shortness of breath.   Patient has been admitted with acute on chronic combined respiratory failure in the setting of community-acquired pneumonia with superimposed COPD exacerbation.  Assessment & Plan:   Principal Problem:   CAP (community acquired pneumonia) Active Problems:   Hypothyroidism   Hyperlipidemia   Severe obesity (BMI >= 40) (HCC)   Essential hypertension   Type 2 diabetes mellitus (HCC)   COPD (chronic obstructive pulmonary disease) (HCC)   CKD (chronic kidney disease) stage 3, GFR 30-59 ml/min (HCC)   Leukocytosis   Gastroesophageal reflux disease without esophagitis   Accidental fall   Acute on chronic respiratory failure with hypoxia and hypercapnia (HCC)   Elevated d-dimer   Macrocytic anemia   Peripheral neuropathy   Abdominal pain   OSA (obstructive sleep apnea)   Acute on chronic combined respiratory failure secondary to COPD exacerbation with -CT angiography suggestive of multifocal pneumonia -Continue IV Rocephin and azithromycin -Continue duo nebs and Solu-Medrol -Repeat venous blood gas -Wears 2 L nasal cannula at home and appears to be stabilizing  OSA on CPAP -Refused BiPAP overnight  Accidental fall -PT recommending home health PT -CT with no acute intracranial pathology  Left lower quadrant abdominal pain -Resolved  Leukocytosis -Acute on chronic -Resolving with treatment of  CAP -Monitor repeat CBC  Elevated D-dimer -CT angiography without PE  Macrocytic anemia -B12 within normal limits and folate level -Start folate supplementation  DM 2 -Blood glucose currently stable -Continue SSI  Hypothyroidism -Continue Synthroid  Dyslipidemia -Crestor  GERD -PPI  Essential hypertension-controlled -Continue Cozaar  Peripheral neuropathy -Resume gabapentin  Morbid obesity -Lifestyle modification  DVT prophylaxis:Lovenox Code Status: Full Family Communication: Discussed with daughter on phone 3/13 Disposition Plan:  Status is: Inpatient  Remains inpatient appropriate because:IV treatments appropriate due to intensity of illness or inability to take PO and Inpatient level of care appropriate due to severity of illness   Dispo: The patient is from: Home              Anticipated d/c is to: Home              Patient currently is not medically stable to d/c.   Difficult to place patient No     Consultants:   None  Procedures:   See below  Antimicrobials:  Anti-infectives (From admission, onward)   Start     Dose/Rate Route Frequency Ordered Stop   03/03/21 1700  azithromycin (ZITHROMAX) 500 mg in sodium chloride 0.9 % 250 mL IVPB        500 mg 250 mL/hr over 60 Minutes Intravenous Every 24 hours 03/02/21 2036 03/07/21 1659   03/03/21 1600  cefTRIAXone (ROCEPHIN) 1 g in sodium chloride 0.9 % 100 mL IVPB        1 g 200 mL/hr over 30 Minutes Intravenous Every 24 hours 03/02/21 2036     03/02/21 1630  cefTRIAXone (ROCEPHIN) 1 g in sodium chloride 0.9 % 100 mL IVPB  1 g 200 mL/hr over 30 Minutes Intravenous  Once 03/02/21 1628 03/02/21 1710   03/02/21 1630  azithromycin (ZITHROMAX) tablet 500 mg        500 mg Oral  Once 03/02/21 1628 03/02/21 1635       Subjective: Patient seen and evaluated today with no new acute complaints or concerns. No acute concerns or events noted overnight.  She appears to be feeling some better, but not  quite at baseline.  Objective: Vitals:   03/03/21 0818 03/03/21 1010 03/03/21 1016 03/03/21 1047  BP:   110/74   Pulse:   91   Resp:      Temp:   97.7 F (36.5 C)   TempSrc:   Oral   SpO2: 96% 96% 92% 93%  Weight:      Height:        Intake/Output Summary (Last 24 hours) at 03/03/2021 1222 Last data filed at 03/03/2021 1000 Gross per 24 hour  Intake 820 ml  Output --  Net 820 ml   Filed Weights   03/02/21 1216 03/02/21 2114  Weight: 117.9 kg 126 kg    Examination:  General exam: Appears calm and comfortable, morbidly obese Respiratory system: Diminished at bases. Respiratory effort normal.  Currently on 2.5 L nasal cannula Cardiovascular system: S1 & S2 heard, RRR.  Gastrointestinal system: Abdomen is soft Central nervous system: Alert and awake Extremities: No edema Skin: No significant lesions noted Psychiatry: Flat affect.    Data Reviewed: I have personally reviewed following labs and imaging studies  CBC: Recent Labs  Lab 03/02/21 1410 03/03/21 0527  WBC 26.9* 17.7*  NEUTROABS 21.0*  --   HGB 10.5* 10.3*  HCT 36.1 35.1*  MCV 104.9* 103.8*  PLT 333 323   Basic Metabolic Panel: Recent Labs  Lab 03/02/21 1410 03/03/21 0527  NA 139 139  K 4.6 4.4  CL 100 100  CO2 31 28  GLUCOSE 112* 133*  BUN 9 8  CREATININE 1.39* 1.27*  CALCIUM 8.7* 8.9  MG  --  2.1  PHOS  --  3.5   GFR: Estimated Creatinine Clearance: 59.2 mL/min (A) (by C-G formula based on SCr of 1.27 mg/dL (H)). Liver Function Tests: Recent Labs  Lab 03/03/21 0527  AST 18  ALT 11  ALKPHOS 111  BILITOT 0.6  PROT 6.9  ALBUMIN 3.1*   No results for input(s): LIPASE, AMYLASE in the last 168 hours. No results for input(s): AMMONIA in the last 168 hours. Coagulation Profile: Recent Labs  Lab 03/03/21 0527  INR 1.2   Cardiac Enzymes: No results for input(s): CKTOTAL, CKMB, CKMBINDEX, TROPONINI in the last 168 hours. BNP (last 3 results) No results for input(s): PROBNP in the  last 8760 hours. HbA1C: No results for input(s): HGBA1C in the last 72 hours. CBG: Recent Labs  Lab 03/02/21 1405 03/02/21 2119 03/03/21 0717 03/03/21 1125  GLUCAP 101* 102* 133* 119*   Lipid Profile: No results for input(s): CHOL, HDL, LDLCALC, TRIG, CHOLHDL, LDLDIRECT in the last 72 hours. Thyroid Function Tests: No results for input(s): TSH, T4TOTAL, FREET4, T3FREE, THYROIDAB in the last 72 hours. Anemia Panel: Recent Labs    03/03/21 0528  VITAMINB12 269  FOLATE 3.6*   Sepsis Labs: Recent Labs  Lab 03/03/21 0527  PROCALCITON 0.18    Recent Results (from the past 240 hour(s))  Culture, blood (Routine X 2) w Reflex to ID Panel     Status: None (Preliminary result)   Collection Time: 03/02/21  9:07 PM  Specimen: Left Antecubital; Blood  Result Value Ref Range Status   Specimen Description LEFT ANTECUBITAL  Final   Special Requests   Final    BOTTLES DRAWN AEROBIC AND ANAEROBIC Blood Culture adequate volume   Culture   Final    NO GROWTH < 12 HOURS Performed at [facility], [address], [place], [place] [address]    Report Status PENDING  Incomplete  Culture, blood (Routine X 2) w Reflex to ID Panel     Status: None (Preliminary result)   Collection Time: 03/02/21  9:12 PM   Specimen: BLOOD LEFT HAND  Result Value Ref Range Status   Specimen Description BLOOD LEFT HAND  Final   Special Requests   Final    BOTTLES DRAWN AEROBIC ONLY Blood Culture adequate volume   Culture   Final    NO GROWTH < 12 HOURS Performed at [facility], [address] [place], [place] [address]    Report Status PENDING  Incomplete         Radiology Studies: CT Head Wo Contrast  Result Date: 03/02/2021 CLINICAL DATA:  Syncope, shortness of breath, elevated D-dimer EXAM: CT HEAD WITHOUT CONTRAST TECHNIQUE: Contiguous axial images were obtained from the base of the skull through the vertex without intravenous contrast. COMPARISON:  None. FINDINGS: Brain: No evidence of acute  infarction, hemorrhage, hydrocephalus, extra-axial collection or mass lesion/mass effect. Vascular: No hyperdense vessel or unexpected calcification. Skull: Normal. Negative for fracture or focal lesion. Sinuses/Orbits: No acute finding. Other: None. IMPRESSION: No acute intracranial pathology. Electronically Signed   By: Lauralyn Primes M.D.   On: 03/02/2021 15:59   CT Angio Chest PE W and/or Wo Contrast  Result Date: 03/02/2021 CLINICAL DATA:  Syncope, rule out PE, recent COVID in February EXAM: CT ANGIOGRAPHY CHEST WITH CONTRAST TECHNIQUE: Multidetector CT imaging of the chest was performed using the standard protocol during bolus administration of intravenous contrast. Multiplanar CT image reconstructions and MIPs were obtained to evaluate the vascular anatomy. CONTRAST:  62mL OMNIPAQUE IOHEXOL 350 MG/ML SOLN COMPARISON:  CT chest, 01/23/2017 FINDINGS: Cardiovascular: Satisfactory opacification of the pulmonary arteries to the segmental level. No evidence of pulmonary embolism. Normal heart size. No pericardial effusion. Left coronary artery calcifications. Aortic atherosclerosis. Mediastinum/Nodes: There are newly enlarged pretracheal, subcarinal, and right hilar lymph nodes, measuring up to 2.8 x 1.9 cm (series 4, image 32). Thyroid gland, trachea, and esophagus demonstrate no significant findings. Lungs/Pleura: There is clustered centrilobular nodular and heterogeneous airspace opacity, predominantly in the right lung, although seen throughout, with a more dense consolidation of the peripheral posterior right upper lobe (series 6, image 58). No pleural effusion or pneumothorax. Upper Abdomen: No acute abnormality. Musculoskeletal: No chest wall abnormality. No acute or significant osseous findings. Review of the MIP images confirms the above findings. IMPRESSION: 1. Negative examination for pulmonary embolism. 2. There is clustered centrilobular nodular and heterogeneous airspace opacity, predominantly in  the right lung, although seen throughout, with a more dense consolidation of the peripheral posterior right upper lobe. Findings are consistent with multifocal infection. Recommend follow-up CT in 3 months to ensure complete resolution and exclude underlying malignancy. 3. There are newly enlarged pretracheal, subcarinal, and right hilar lymph nodes, nonspecific although likely reactive. 4. Coronary artery disease. Aortic Atherosclerosis (ICD10-I70.0). Electronically Signed   By: Lauralyn Primes M.D.   On: 03/02/2021 15:58   DG Chest Port 1 View  Result Date: 03/02/2021 CLINICAL DATA:  Shortness of breath and syncope EXAM: PORTABLE CHEST 1 VIEW COMPARISON:  December 12, 2016 FINDINGS: Mild scarring left base. Focus of ill-defined opacity lateral right mid lung adjacent to the minor fissure on the right. Otherwise no edema or airspace opacity. Heart is borderline enlarged with pulmonary vascularity normal. No adenopathy. No bone lesions. IMPRESSION: Mild scarring left base. Focus of opacity in the periphery of the right mid lung adjacent to the minor fissure. A small focus of infiltrate must be of concern given this appearance. Lungs elsewhere clear. Borderline cardiac enlargement. No adenopathy appreciable. Electronically Signed   By: Bretta Bang III M.D.   On: 03/02/2021 15:01        Scheduled Meds: . albuterol  2.5 mg Nebulization TID  . ALPRAZolam  1 mg Oral BID  . amLODipine  5 mg Oral Daily  . dextromethorphan-guaiFENesin  1 tablet Oral BID  . enoxaparin (LOVENOX) injection  40 mg Subcutaneous Q24H  . fluticasone furoate-vilanterol  1 puff Inhalation Daily  . gabapentin  600 mg Oral QHS  . insulin aspart  0-5 Units Subcutaneous QHS  . insulin aspart  0-9 Units Subcutaneous TID WC  . ipratropium  2 spray Each Nare QID  . levothyroxine  88 mcg Oral QAC breakfast  . loratadine  10 mg Oral QPM  . losartan  100 mg Oral Daily  . methylPREDNISolone (SOLU-MEDROL) injection  40 mg Intravenous  Q12H  . pantoprazole  40 mg Oral BID  . rosuvastatin  20 mg Oral Daily  . sertraline  100 mg Oral Daily  . theophylline  300 mg Oral BID  . traZODone  100 mg Oral QHS  . umeclidinium bromide  1 puff Inhalation Daily   Continuous Infusions: . azithromycin    . cefTRIAXone (ROCEPHIN)  IV       LOS: 1 day    Time spent: 35 minutes    Fae Blossom Hoover Brunette, DO [facility]  If 7PM-7AM, please contact night-coverage [email or web address] 03/03/2021, 12:22 PM

## 2021-03-03 NOTE — Evaluation (Signed)
Physical Therapy Evaluation Patient Details Name: Krystal Burgess MRN: [id number] DOB: Oct 10, 1961 Today's Date: 03/03/2021   History of Present Illness  : Krystal Burgess is a 60 y.o. female with medical history significant for hypertension, hyperlipidemia, hypothyroidism, T2DM, chronic respiratory failure with hypoxia due to COPD (on supplemental oxygen at 2 LPM), CKD stage IIIb, morbid obesity and recent COVID-19 viral infection (in early February of this year) treated with outpatient remdesivir who presents to the emergency department via EMS due to 1 week onset of increasing shortness of breath.  Patient ambulates with a walker at baseline and lives with daughter who provides care for her.  This morning, she went to the bathroom and she felt a left lower quadrant pain which she thought was due to her ovary, she states that she felt lightheaded and fell without losing consciousness, daughter quickly came to her aid and activated EMS, on arrival of EMS team, she was noted to have an O2 sat of 85% on home 2 LPM of oxygen, patient complained of increased cough with occasional clear thick and sometimes liquidy sputum occasional blood-streaked.  She complained of headache, but denies fever, chills, chest pain, nausea or vomiting.   Clinical Impression   Patient demonstrates most significant deficits in functional activity tolerance as she is able to perform bed mobility and transfers with independence to modified independence requiring increased time due to fatigue, SOB, and decrease in O2 sats from baseline of 96% to 85% with mobility and exertion.  Patient demonstrates adequate standing balance provided use of AD for BUE support and activity pacing and pursed-lip breathing employed to slow respiratory rate and improve gas exchange requiring frequent cues for techniques.  Demonstrates rapid onset of fatigue and SOB ambulating with slow, but steady pattern x 20 ft with seated rest periods of 2-4 min needed  between bouts with consistent cues for pursed-lip breathing.  Continued services whilst hospitalized indicated to improve gait/activity tolerance to facilitate safe return to home.    Follow Up Recommendations Home health PT (pt would benefit from services to improve functional activity tolerance)    Equipment Recommendations  None recommended by PT (pt reports use of rollator at home)    Recommendations for Other Services       Precautions / Restrictions        Mobility  Bed Mobility Overal bed mobility: Independent               Patient Response: Cooperative  Transfers Overall transfer level: Modified independent Equipment used: Rolling walker (2 wheeled)                Ambulation/Gait Ambulation/Gait assistance: Supervision Gait Distance (Feet): 20 Feet Assistive device: Rolling walker (2 wheeled) Gait Pattern/deviations: Step-to pattern Gait velocity: decreased   General Gait Details: fatigue/SOB with exertion. O2 sats decrease to 85% with extertion  Stairs Stairs:  (not attempted due to fatigue and SOB)          Wheelchair Mobility    Modified Rankin (Stroke Patients Only)       Balance Overall balance assessment: Modified Independent;History of Falls                                           Pertinent Vitals/Pain Pain Assessment: No/denies pain    Home Living Family/patient expects to be discharged to:: Private residence Living Arrangements: Children Available Help at Discharge:  Family Type of Home: House Home Access: Stairs to enter Entrance Stairs-Rails: Can reach both (when exiting home to car) Entrance Stairs-Number of Steps: 2-4 depending on entrance/exit. Typically 2 STE without HR Home Layout: One level Home Equipment: Walker - 4 wheels      Prior Function Level of Independence: Needs assistance   Gait / Transfers Assistance Needed: Able to ambulate household distances with RW and O2  ADL's /  Homemaking Assistance Needed: family assist        Hand Dominance        Extremity/Trunk Assessment   Upper Extremity Assessment Upper Extremity Assessment: Overall WFL for tasks assessed    Lower Extremity Assessment Lower Extremity Assessment: Generalized weakness       Communication   Communication: No difficulties  Cognition Arousal/Alertness: Awake/alert Behavior During Therapy: WFL for tasks assessed/performed Overall Cognitive Status: Within Functional Limits for tasks assessed                                        General Comments General comments (skin integrity, edema, etc.): use of BUE support on AD    Exercises General Exercises - Lower Extremity Ankle Circles/Pumps: AROM;Both;20 reps Quad Sets: Strengthening;Both;20 reps Long Arc Quad: Strengthening;Both;20 reps   Assessment/Plan    PT Assessment Patient needs continued PT services  PT Problem List Decreased strength;Decreased activity tolerance;Decreased mobility;Cardiopulmonary status limiting activity;Obesity       PT Treatment Interventions Gait training;Stair training;Functional mobility training;Therapeutic activities;Patient/family education;Therapeutic exercise;Manual techniques    PT Goals (Current goals can be found in the Care Plan section)  Acute Rehab PT Goals Patient Stated Goal: "Improve my breathing with walking" PT Goal Formulation: With patient Time For Goal Achievement: 03/08/21 Potential to Achieve Goals: Good    Frequency Min 3X/week   Barriers to discharge        Co-evaluation               AM-PAC PT "6 Clicks" Mobility  Outcome Measure Help needed turning from your back to your side while in a flat bed without using bedrails?: None Help needed moving from lying on your back to sitting on the side of a flat bed without using bedrails?: None Help needed moving to and from a bed to a chair (including a wheelchair)?: None Help needed standing up  from a chair using your arms (e.g., wheelchair or bedside chair)?: None Help needed to walk in hospital room?: A Little Help needed climbing 3-5 steps with a railing? : A Lot 6 Click Score: 21    End of Session Equipment Utilized During Treatment: Gait belt Activity Tolerance: Patient limited by fatigue Patient left: in bed;with call bell/phone within reach Nurse Communication: Mobility status PT Visit Diagnosis: Muscle weakness (generalized) (M62.81);Other abnormalities of gait and mobility (R26.89)    Time: 1610-9604 PT Time Calculation (min) (ACUTE ONLY): 30 min   Charges:   PT Evaluation $PT Eval Low Complexity: 1 Low PT Treatments $Gait Training: 8-22 mins $Therapeutic Exercise: 8-22 mins       10:19 AM, 03/03/21 M. Shary Decamp, PT, DPT Physical Therapist- [place] Office Number: [phone]

## 2021-03-03 NOTE — Progress Notes (Signed)
Patient was placed on BIPAP 10/5 with 3 lpm O 2 bled into device. Patient mention to RT after finally getting her on the device that if she wasn't able to wear the device she was going to place her Imogene back on. Rt left Pollock running at bedside and advised RN of any changes. At 0315 patient removed device and placed her O 2 back on to go to sleep. RT discontinued ABG seeing patient is still on 3 lpm  and had not worn the BIPAP. Patient has not been in any distress and RT/RN are monitoring for any changes.

## 2021-03-04 LAB — BASIC METABOLIC PANEL
Anion gap: 10 (ref 5–15)
BUN: 16 mg/dL (ref 6–20)
CO2: 29 mmol/L (ref 22–32)
Calcium: 8.9 mg/dL (ref 8.9–10.3)
Chloride: 100 mmol/L (ref 98–111)
Creatinine, Ser: 1.36 mg/dL — ABNORMAL HIGH (ref 0.44–1.00)
GFR, Estimated: 45 mL/min — ABNORMAL LOW (ref >60)
Glucose, Bld: 141 mg/dL — ABNORMAL HIGH (ref 70–99)
Potassium: 4.3 mmol/L (ref 3.5–5.1)
Sodium: 139 mmol/L (ref 135–145)

## 2021-03-04 LAB — CBC
HCT: 30.2 % — ABNORMAL LOW (ref 36.0–46.0)
Hemoglobin: 9.3 g/dL — ABNORMAL LOW (ref 12.0–15.0)
MCH: 30.6 pg (ref 26.0–34.0)
MCHC: 30.8 g/dL (ref 30.0–36.0)
MCV: 99.3 fL (ref 80.0–100.0)
Platelets: 343 10*3/uL (ref 150–400)
RBC: 3.04 MIL/uL — ABNORMAL LOW (ref 3.87–5.11)
RDW: 13.4 % (ref 11.5–15.5)
WBC: 14.2 10*3/uL — ABNORMAL HIGH (ref 4.0–10.5)
nRBC: 0 % (ref 0.0–0.2)

## 2021-03-04 LAB — GLUCOSE, CAPILLARY
Glucose-Capillary: 124 mg/dL — ABNORMAL HIGH (ref 70–99)
Glucose-Capillary: 98 mg/dL (ref 70–99)

## 2021-03-04 LAB — LEGIONELLA PNEUMOPHILA SEROGP 1 UR AG: L. pneumophila Serogp 1 Ur Ag: NEGATIVE

## 2021-03-04 LAB — MAGNESIUM: Magnesium: 2.1 mg/dL (ref 1.7–2.4)

## 2021-03-04 MED ORDER — PREDNISONE 20 MG PO TABS: 40.0000 mg | ORAL_TABLET | Freq: Every day | ORAL | 0 refills | Status: AC

## 2021-03-04 MED ORDER — DM-GUAIFENESIN ER 30-600 MG PO TB12: 1.0000 | ORAL_TABLET | Freq: Two times a day (BID) | ORAL | 0 refills | Status: AC

## 2021-03-04 MED ORDER — DOXYCYCLINE HYCLATE 100 MG PO TABS: 100.0000 mg | ORAL_TABLET | Freq: Two times a day (BID) | ORAL | 0 refills | Status: AC

## 2021-03-04 MED ORDER — FOLIC ACID 1 MG PO TABS: 1.0000 mg | ORAL_TABLET | Freq: Every day | ORAL | 0 refills | Status: DC

## 2021-03-04 NOTE — Discharge Summary (Signed)
Physician Discharge Summary  Krystal Burgess ZOX:[id number] DOB: Nov 27, 1961 DOA: 03/02/2021  PCP: Agapito Games, MD  Admit date: 03/02/2021  Discharge date: 03/04/2021  Admitted From:Home  Disposition:  Home  Recommendations for Outpatient Follow-up:  1. Follow up with PCP in 1-2 weeks 2. Prednisone taper as prescribed 3. Continue on doxycycline for 5 more days to complete course of treatment for pneumonia 4. Continue home oxygen as prior 5. Consider sleep study outpatient  Home Health: Declines home health PT  Equipment/Devices: Has home oxygen 2 L nasal cannula  Discharge Condition:Stable  CODE STATUS: Full  Diet recommendation: Heart Healthy/carb modified  Brief/Interim Summary:  Krystal Bacus [name] a 60 y.o.femalewith medical history significant forhypertension, hyperlipidemia, hypothyroidism, T2DM, chronic respiratory failure with hypoxia due to COPD (on supplemental oxygen at 2 LPM), CKD stage IIIb, morbid obesity and recent COVID-19 viral infection (in early February of this year) treated with outpatient remdesivir who presents to the emergency department via EMS due to 1 week onset of increasing shortness of breath.  Patient has been admitted with acute on chronic combined respiratory failure in the setting of community-acquired pneumonia with superimposed COPD exacerbation.  Patient has done quite well with treatment on IV steroids as well as breathing treatments and IV antibiotics to include azithromycin and Rocephin.  She is in stable condition for discharge today to continue home breathing treatments and prednisone taper as prescribed along with antibiotics to include doxycycline.  Discharge Diagnoses:  Principal Problem:   CAP (community acquired pneumonia) Active Problems:   Hypothyroidism   Hyperlipidemia   Severe obesity (BMI >= 40) (HCC)   Essential hypertension   Type 2 diabetes mellitus (HCC)   COPD (chronic obstructive pulmonary disease) (HCC)    CKD (chronic kidney disease) stage 3, GFR 30-59 ml/min (HCC)   Leukocytosis   Gastroesophageal reflux disease without esophagitis   Accidental fall   Acute on chronic respiratory failure with hypoxia and hypercapnia (HCC)   Elevated d-dimer   Macrocytic anemia   Peripheral neuropathy   Abdominal pain   OSA (obstructive sleep apnea)   Principle discharge diagnosis: Acute on chronic combined respiratory failure secondary to COPD exacerbation in the setting of CAP.  Discharge Instructions  Discharge Instructions    Diet - low sodium heart healthy   Complete by: As directed    Increase activity slowly   Complete by: As directed      Allergies as of 03/04/2021      Reactions   Risperidone And Related Shortness Of Breath   Aripiprazole Other (See Comments)   REACTION: muscle jerks   Fluoxetine Hcl Other (See Comments)   REACTION: Intolerant   Nsaids Other (See Comments)   Upper GI bleed   Paroxetine Other (See Comments)   REACTION: Intolerance   Ziprasidone Hcl Other (See Comments)   shakes   Chantix [varenicline Tartrate] Other (See Comments)   "messing with my mood"   Metformin And Related Nausea Only   Montelukast Other (See Comments)   "it kept me sick with an upper respiratory infection"   Onglyza [saxagliptin] Nausea Only   Augmentin [amoxicillin-pot Clavulanate] Other (See Comments)   Gets yeast infection every time   Fluoxetine Hcl    REACTION: Intolerant      Medication List    TAKE these medications   albuterol 108 (90 Base) MCG/ACT inhaler Commonly known as: Ventolin HFA Inhale 2 puffs into the lungs every 4 (four) hours as needed for wheezing or shortness of breath.   ALPRAZolam 1  MG tablet Commonly known as: XANAX Take 1 mg by mouth in the morning, at noon, and at bedtime.   AMBULATORY NON FORMULARY MEDICATION Medication Name: ONE TOUCH METER. CHECK BLOOD SUGAR TWICE DAILY. DX:E11.9   AMBULATORY NON FORMULARY MEDICATION Medication Name: Portable  oxygen concentrator with tubing and supplies. Dx: COPD  Fax to [facility].  Dx Severe COPD   AMBULATORY NON FORMULARY MEDICATION Medication Name: portable battery O2 concentrator   AMBULATORY NON FORMULARY MEDICATION Humidifier to use with oxygen   amLODipine 5 MG tablet Commonly known as: NORVASC Take 1 tablet (5 mg total) by mouth daily.   dextromethorphan-guaiFENesin 30-600 MG 12hr tablet Commonly known as: MUCINEX DM Take 1 tablet by mouth 2 (two) times daily for 10 days.   diclofenac sodium 1 % Gel Commonly known as: VOLTAREN Apply 4 g topically 4 (four) times daily as needed (pain).   doxycycline 100 MG tablet Commonly known as: VIBRA-TABS Take 1 tablet (100 mg total) by mouth 2 (two) times daily for 5 days.   fluticasone 50 MCG/ACT nasal spray Commonly known as: FLONASE SPRAY 2 SPRAYS INTO EACH NOSTRIL EVERY DAY What changed:   how much to take  how to take this  when to take this  additional instructions   folic acid 1 MG tablet Commonly known as: FOLVITE Take 1 tablet (1 mg total) by mouth daily. Start taking on: March 05, 2021   gabapentin 600 MG tablet Commonly known as: NEURONTIN Take 1 tablet (600 mg total) by mouth at bedtime.   glucose blood test strip TEST TWICE A DAY. DX: E11.9   ipratropium 0.06 % nasal spray Commonly known as: ATROVENT Place 2 sprays into both nostrils 4 (four) times daily.   ipratropium-albuterol 0.5-2.5 (3) MG/3ML Soln Commonly known as: DUONEB TAKE 3 MLS BY NEBULIZATION EVERY 2 (TWO) HOURS WHILE AWAKE. What changed:   how much to take  how to take this  when to take this  reasons to take this  additional instructions   Januvia 100 MG tablet Generic drug: sitaGLIPtin TAKE 1 TABLET BY MOUTH EVERY DAY What changed: how much to take   levocetirizine 5 MG tablet Commonly known as: XYZAL Take 1 tablet (5 mg total) by mouth every evening.   levothyroxine 88 MCG tablet Commonly known as:  SYNTHROID Take 1 tablet (88 mcg total) by mouth daily.   losartan 100 MG tablet Commonly known as: COZAAR Take 1 tablet (100 mg total) by mouth daily.   multivitamin with minerals tablet Take 1 tablet by mouth daily. Breeze MD vit. For respiratory health   mupirocin ointment 2 % Commonly known as: BACTROBAN APPLY TO INSIDE OF EACH NARES DAILY FOR 10 DAYS THEN TWICE A WEEK FOR MAINTENANCE.   naproxen 500 MG tablet Commonly known as: NAPROSYN TAKE 1 TABLET BY MOUTH 2 TIMES DAILY WITH A MEAL.   OneTouch Delica Lancets 33G Misc USE TWICE DAILY. DX: E11.9   oxyCODONE-acetaminophen 7.5-325 MG tablet Commonly known as: PERCOCET Take 1 tablet by mouth every 6 (six) hours as needed for moderate pain.   OXYGEN Inhale 4 L into the lungs.   pantoprazole 40 MG tablet Commonly known as: PROTONIX Take 1 tablet (40 mg total) by mouth 2 (two) times daily.   predniSONE 5 MG tablet Commonly known as: DELTASONE Take 5 mg by mouth daily with breakfast. What changed: Another medication with the same name was changed. Make sure you understand how and when to take each.   predniSONE 20 MG  tablet Commonly known as: DELTASONE Take 2 tablets (40 mg total) by mouth daily for 5 days. What changed: when to take this   promethazine 25 MG tablet Commonly known as: PHENERGAN TAKE 1 TABLET BY MOUTH 2 TIMES A DAY AS NEEDED FOR NAUSEA OR VOMITING What changed: See the new instructions.   rosuvastatin 20 MG tablet Commonly known as: CRESTOR Take 1 tablet (20 mg total) by mouth daily.   sertraline 100 MG tablet Commonly known as: ZOLOFT Take 100 mg by mouth daily.   theophylline 300 MG 12 hr tablet Commonly known as: THEODUR TAKE 1 TABLET (300 MG TOTAL) BY MOUTH 2 (TWO) TIMES DAILY.   tiZANidine 4 MG tablet Commonly known as: ZANAFLEX TAKE 1 TABLET BY MOUTH TWICE A DAY AS NEEDED MUSCLE SPASMS What changed: See the new instructions.   traZODone 100 MG tablet Commonly known as: DESYREL Take  100 mg by mouth at bedtime.   Trelegy Ellipta 100-62.5-25 MCG/INH Aepb Generic drug: Fluticasone-Umeclidin-Vilant Inhale 1 puff into the lungs daily.       Follow-up Information    Agapito GamesMetheney, Catherine D, MD Follow up in 1 week(s).   Specialty: Family Medicine Contact information: [address] [place] [address] [phone]              Allergies  Allergen Reactions  . Risperidone And Related Shortness Of Breath  . Aripiprazole Other (See Comments)    REACTION: muscle jerks   . Fluoxetine Hcl Other (See Comments)    REACTION: Intolerant  . Nsaids Other (See Comments)    Upper GI bleed   . Paroxetine Other (See Comments)    REACTION: Intolerance   . Ziprasidone Hcl Other (See Comments)    shakes   . Chantix [Varenicline Tartrate] Other (See Comments)    "messing with my mood"  . Metformin And Related Nausea Only  . Montelukast Other (See Comments)    "it kept me sick with an upper respiratory infection"  . Onglyza [Saxagliptin] Nausea Only  . Augmentin [Amoxicillin-Pot Clavulanate] Other (See Comments)    Gets yeast infection every time   . Fluoxetine Hcl     REACTION: Intolerant    Consultations:  None   Procedures/Studies: CT Head Wo Contrast  Result Date: 03/02/2021 CLINICAL DATA:  Syncope, shortness of breath, elevated D-dimer EXAM: CT HEAD WITHOUT CONTRAST TECHNIQUE: Contiguous axial images were obtained from the base of the skull through the vertex without intravenous contrast. COMPARISON:  None. FINDINGS: Brain: No evidence of acute infarction, hemorrhage, hydrocephalus, extra-axial collection or mass lesion/mass effect. Vascular: No hyperdense vessel or unexpected calcification. Skull: Normal. Negative for fracture or focal lesion. Sinuses/Orbits: No acute finding. Other: None. IMPRESSION: No acute intracranial pathology. Electronically Signed   By: Lauralyn PrimesAlex  Bibbey M.D.   On: 03/02/2021 15:59   CT Angio Chest PE W and/or Wo  Contrast  Result Date: 03/02/2021 CLINICAL DATA:  Syncope, rule out PE, recent COVID in February EXAM: CT ANGIOGRAPHY CHEST WITH CONTRAST TECHNIQUE: Multidetector CT imaging of the chest was performed using the standard protocol during bolus administration of intravenous contrast. Multiplanar CT image reconstructions and MIPs were obtained to evaluate the vascular anatomy. CONTRAST:  75mL OMNIPAQUE IOHEXOL 350 MG/ML SOLN COMPARISON:  CT chest, 01/23/2017 FINDINGS: Cardiovascular: Satisfactory opacification of the pulmonary arteries to the segmental level. No evidence of pulmonary embolism. Normal heart size. No pericardial effusion. Left coronary artery calcifications. Aortic atherosclerosis. Mediastinum/Nodes: There are newly enlarged pretracheal, subcarinal, and right hilar lymph nodes, measuring  up to 2.8 x 1.9 cm (series 4, image 32). Thyroid gland, trachea, and esophagus demonstrate no significant findings. Lungs/Pleura: There is clustered centrilobular nodular and heterogeneous airspace opacity, predominantly in the right lung, although seen throughout, with a more dense consolidation of the peripheral posterior right upper lobe (series 6, image 58). No pleural effusion or pneumothorax. Upper Abdomen: No acute abnormality. Musculoskeletal: No chest wall abnormality. No acute or significant osseous findings. Review of the MIP images confirms the above findings. IMPRESSION: 1. Negative examination for pulmonary embolism. 2. There is clustered centrilobular nodular and heterogeneous airspace opacity, predominantly in the right lung, although seen throughout, with a more dense consolidation of the peripheral posterior right upper lobe. Findings are consistent with multifocal infection. Recommend follow-up CT in 3 months to ensure complete resolution and exclude underlying malignancy. 3. There are newly enlarged pretracheal, subcarinal, and right hilar lymph nodes, nonspecific although likely reactive. 4.  Coronary artery disease. Aortic Atherosclerosis (ICD10-I70.0). Electronically Signed   By: Lauralyn Primes M.D.   On: 03/02/2021 15:58   DG Chest Port 1 View  Result Date: 03/02/2021 CLINICAL DATA:  Shortness of breath and syncope EXAM: PORTABLE CHEST 1 VIEW COMPARISON:  December 12, 2016 FINDINGS: Mild scarring left base. Focus of ill-defined opacity lateral right mid lung adjacent to the minor fissure on the right. Otherwise no edema or airspace opacity. Heart is borderline enlarged with pulmonary vascularity normal. No adenopathy. No bone lesions. IMPRESSION: Mild scarring left base. Focus of opacity in the periphery of the right mid lung adjacent to the minor fissure. A small focus of infiltrate must be of concern given this appearance. Lungs elsewhere clear. Borderline cardiac enlargement. No adenopathy appreciable. Electronically Signed   By: Bretta Bang III M.D.   On: 03/02/2021 15:01      Discharge Exam: Vitals:   03/04/21 0816 03/04/21 0817  BP:    Pulse:    Resp:    Temp:    SpO2: (S) (!) 86% 92%   Vitals:   03/03/21 2039 03/04/21 0603 03/04/21 0816 03/04/21 0817  BP: 117/89 114/70    Pulse: 94 74    Resp: 18 18    Temp: (!) 97.3 F (36.3 C) 98.3 F (36.8 C)    TempSrc:      SpO2: 96% 96% (S) (!) 86% 92%  Weight:      Height:        General: Pt is alert, awake, not in acute distress, morbidly obese Cardiovascular: RRR, S1/S2 +, no rubs, no gallops Respiratory: CTA bilaterally, no wheezing, no rhonchi, 2L [place] Abdominal: Soft, NT, ND, bowel sounds + Extremities: no edema, no cyanosis    The results of significant diagnostics from this hospitalization (including imaging, microbiology, ancillary and laboratory) are listed below for reference.     Microbiology: Recent Results (from the past 240 hour(s))  Culture, blood (Routine X 2) w Reflex to ID Panel     Status: None (Preliminary result)   Collection Time: 03/02/21  9:07 PM   Specimen: Left Antecubital; Blood   Result Value Ref Range Status   Specimen Description LEFT ANTECUBITAL  Final   Special Requests   Final    BOTTLES DRAWN AEROBIC AND ANAEROBIC Blood Culture adequate volume   Culture   Final    NO GROWTH 2 DAYS Performed at [facility], [address], [place], [place] [address]    Report Status PENDING  Incomplete  Culture, blood (Routine X 2) w Reflex to ID Panel  Status: None (Preliminary result)   Collection Time: 03/02/21  9:12 PM   Specimen: BLOOD LEFT HAND  Result Value Ref Range Status   Specimen Description BLOOD LEFT HAND  Final   Special Requests   Final    BOTTLES DRAWN AEROBIC ONLY Blood Culture adequate volume   Culture   Final    NO GROWTH 2 DAYS Performed at [facility], [address], [place], [place] [address]    Report Status PENDING  Incomplete     Labs: BNP (last 3 results) No results for input(s): BNP in the last 8760 hours. Basic Metabolic Panel: Recent Labs  Lab 03/02/21 1410 03/03/21 0527 03/04/21 0528  NA 139 139 139  K 4.6 4.4 4.3  CL 100 100 100  CO2 31 28 29   GLUCOSE 112* 133* 141*  BUN 9 8 16   CREATININE 1.39* 1.27* 1.36*  CALCIUM 8.7* 8.9 8.9  MG  --  2.1 2.1  PHOS  --  3.5  --    Liver Function Tests: Recent Labs  Lab 03/03/21 0527  AST 18  ALT 11  ALKPHOS 111  BILITOT 0.6  PROT 6.9  ALBUMIN 3.1*   No results for input(s): LIPASE, AMYLASE in the last 168 hours. No results for input(s): AMMONIA in the last 168 hours. CBC: Recent Labs  Lab 03/02/21 1410 03/03/21 0527 03/04/21 0528  WBC 26.9* 17.7* 14.2*  NEUTROABS 21.0*  --   --   HGB 10.5* 10.3* 9.3*  HCT 36.1 35.1* 30.2*  MCV 104.9* 103.8* 99.3  PLT 333 323 343   Cardiac Enzymes: No results for input(s): CKTOTAL, CKMB, CKMBINDEX, TROPONINI in the last 168 hours. BNP: Invalid input(s): POCBNP CBG: Recent Labs  Lab 03/03/21 0717 03/03/21 1125 03/03/21 1612 03/03/21 2040 03/04/21 0713  GLUCAP 133* 119* 145* 127* 124*   D-Dimer Recent Labs     03/02/21 1410  DDIMER 2.08*   Hgb A1c Recent Labs    03/02/21 2112  HGBA1C 6.1*   Lipid Profile No results for input(s): CHOL, HDL, LDLCALC, TRIG, CHOLHDL, LDLDIRECT in the last 72 hours. Thyroid function studies No results for input(s): TSH, T4TOTAL, T3FREE, THYROIDAB in the last 72 hours.  Invalid input(s): FREET3 Anemia work up Recent Labs    03/03/21 0528  VITAMINB12 269  FOLATE 3.6*   Urinalysis    Component Value Date/Time   COLORURINE YELLOW 03/02/2021 1449   APPEARANCEUR CLEAR 03/02/2021 1449   LABSPEC 1.009 03/02/2021 1449   PHURINE 6.0 03/02/2021 1449   GLUCOSEU NEGATIVE 03/02/2021 1449   HGBUR NEGATIVE 03/02/2021 1449   HGBUR negative 05/03/2009 1012   BILIRUBINUR NEGATIVE 03/02/2021 1449   BILIRUBINUR neg 01/15/2015 1433   KETONESUR NEGATIVE 03/02/2021 1449   PROTEINUR NEGATIVE 03/02/2021 1449   UROBILINOGEN 0.2 01/15/2015 1433   UROBILINOGEN 0.2 05/03/2009 1012   NITRITE NEGATIVE 03/02/2021 1449   LEUKOCYTESUR NEGATIVE 03/02/2021 1449   Sepsis Labs Invalid input(s): PROCALCITONIN,  WBC,  LACTICIDVEN Microbiology Recent Results (from the past 240 hour(s))  Culture, blood (Routine X 2) w Reflex to ID Panel     Status: None (Preliminary result)   Collection Time: 03/02/21  9:07 PM   Specimen: Left Antecubital; Blood  Result Value Ref Range Status   Specimen Description LEFT ANTECUBITAL  Final   Special Requests   Final    BOTTLES DRAWN AEROBIC AND ANAEROBIC Blood Culture adequate volume   Culture   Final    NO GROWTH 2 DAYS Performed at [facility], [address], [place],  [place] [address]    Report Status PENDING  Incomplete  Culture, blood (Routine X 2) w Reflex to ID Panel     Status: None (Preliminary result)   Collection Time: 03/02/21  9:12 PM   Specimen: BLOOD LEFT HAND  Result Value Ref Range Status   Specimen Description BLOOD LEFT HAND  Final   Special Requests   Final    BOTTLES DRAWN AEROBIC ONLY Blood Culture adequate volume    Culture   Final    NO GROWTH 2 DAYS Performed at [facility], [address], [place], [place] [address]    Report Status PENDING  Incomplete     Time coordinating discharge: 35 minutes  SIGNED:   Erick Blinks, DO [facility] 03/04/2021, 10:07 AM  If 7PM-7AM, please contact night-coverage [email or web address]

## 2021-03-04 NOTE — TOC Initial Note (Signed)
Transition of Care [facility]) - Initial/Assessment Note    Patient Details  Name: Krystal Burgess MRN: [id number] Date of Birth: Jul 15, 1961  [facility]) CM/SW Contact:    Karn Cassis, LCSW Phone Number: 03/04/2021, 9:10 AM  Clinical Narrative:  Pt admitted due to community acquired pneumonia. She reports she lives with her daughter and son-in-law. Pt indicates it takes a lot of time for her to complete ADLs, but she is independently. She ambulates with a rollator. PT evaluated pt and recommend home health. LCSW discussed with pt who declines. She said they just moved and she wants to get settled before having home health come out. She is aware to discuss with PCP when ready. Pt also mentioned she would like to switch home O2 company. She states she cannot use [employer] as she has a bill with them. Pt will discuss with PCP about sending orders to [facility]. No other needs reported. TOC will continue to follow.                  Expected Discharge Plan: Home/Self Care Barriers to Discharge: Continued Medical Work up   Patient Goals and CMS Choice Patient states their goals for this hospitalization and ongoing recovery are:: return home   Choice offered to / list presented to : Patient  Expected Discharge Plan and Services Expected Discharge Plan: Home/Self Care In-house Referral: Clinical Social Work   Post Acute Care Choice: NA Living arrangements for the past 2 months: Single Family Home                 DME Arranged: N/A         HH Arranged: Patient Refused HH          Prior Living Arrangements/Services Living arrangements for the past 2 months: Single Family Home Lives with:: Adult Children Patient language and need for interpreter reviewed:: No Do you feel safe going back to the place where you live?: Yes      Need for Family Participation in Patient Care: Yes (Comment) Care giver support system in place?: Yes (comment) Current home  services: DME (Rollator, shower seat, O2) Criminal Activity/Legal Involvement Pertinent to Current Situation/Hospitalization: No - Comment as needed  Activities of Daily Living Home Assistive Devices/Equipment: Environmental consultant (specify type) (Rollator) ADL Screening (condition at time of admission) Patient's cognitive ability adequate to safely complete daily activities?: Yes Is the patient deaf or have difficulty hearing?: No Does the patient have difficulty seeing, even when wearing glasses/contacts?: No Does the patient have difficulty concentrating, remembering, or making decisions?: No Patient able to express need for assistance with ADLs?: Yes Does the patient have difficulty dressing or bathing?: No Independently performs ADLs?: Yes (appropriate for developmental age) Does the patient have difficulty walking or climbing stairs?: Yes Weakness of Legs: Both Weakness of Arms/Hands: None  Permission Sought/Granted                  Emotional Assessment   Attitude/Demeanor/Rapport: Engaged Affect (typically observed): Accepting Orientation: : Oriented to Place,Oriented to Self,Oriented to  Time,Oriented to Situation Alcohol / Substance Use: Not Applicable Psych Involvement: No (comment)  Admission diagnosis:  Hypoxia [R09.02] CAP (community acquired pneumonia) [J18.9] Community acquired pneumonia of right middle lobe of lung [J18.9] Patient Active Problem List   Diagnosis Date Noted  . CAP (community acquired pneumonia) 03/02/2021  . Accidental fall 03/02/2021  . Acute on chronic respiratory failure with hypoxia and hypercapnia (HCC) 03/02/2021  . Elevated d-dimer 03/02/2021  .  Macrocytic anemia 03/02/2021  . Peripheral neuropathy 03/02/2021  . Abdominal pain 03/02/2021  . OSA (obstructive sleep apnea) 03/02/2021  . RLS (restless legs syndrome) 01/14/2019  . Sinus tachycardia 04/08/2018  . Hypernatremia 04/08/2018  . Chronic ethmoidal sinusitis 01/29/2018  . CKD (chronic  kidney disease) stage 3, GFR 30-59 ml/min (HCC) 10/26/2017  . Morbid obesity with BMI of 50.0-59.9, adult (HCC) 10/26/2017  . Cutaneous candidiasis 10/24/2017  . Chronic back pain 10/24/2017  . Chronic respiratory failure with hypoxia (HCC) 10/23/2017  . Hypomagnesemia 10/23/2017  . Glaucoma 10/23/2017  . COPD (chronic obstructive pulmonary disease) (HCC) 09/09/2017  . Chronic insomnia 01/30/2017  . Pulmonary nodule, RML, needs noncontrast CT in February 2019 01/26/2017  . Lumbar degenerative disc disease 08/14/2016  . Combined forms of age-related cataract of 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  . 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 Lower GI bleed 10/18/2014  . Gastroesophageal reflux disease without esophagitis 10/12/2014  . Diabetic peripheral neuropathy associated with type 2 diabetes mellitus (HCC) 10/12/2014  . 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  . Acute respiratory failure with hypoxia and hypercapnia (HCC) 10/09/2014  . Leukocytosis 10/08/2014  . History of anxiety 10/08/2014  . Type 2 diabetes mellitus (HCC) 09/07/2014  . Acute angle-closure glaucoma 10/03/2013  . Bilateral carpal tunnel syndrome 03/14/2013  . Bipolar disorder (HCC) 01/24/2013  . 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 Vitamin B 12 deficiency 06/24/2011  . ALLERGIC RHINITIS 08/17/2009  . UNSPECIFIED VITAMIN D DEFICIENCY 07/13/2009  . Severe obesity (BMI >= 40) (HCC) 05/03/2009  . Essential hypertension 03/26/2009  . LEG EDEMA, BILATERAL 03/26/2009  . Hypothyroidism 02/20/2009  . Hyperlipidemia 12/12/2008  . Former smoker 12/12/2008  . DEPRESSION 12/12/2008  . Mononeuritis 12/12/2008  . 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 COPD (chronic obstructive pulmonary disease) with chronic bronchitis (HCC) 12/12/2008  . Spinal stenosis of lumbar region 12/12/2008   PCP:  Agapito Games, MD Pharmacy:   [facility] [phone] - [place], [place] - [address] [place] [address] Phone: [phone] Fax: [phone]  [facility] [phone] [place], [place] - [phone] [facility] [address] [place] [address] Phone: [phone] Fax: [phone]     Social Determinants of Health (SDOH) Interventions    Readmission Risk Interventions No flowsheet data found.

## 2021-03-04 NOTE — Evaluation (Signed)
Occupational Therapy Evaluation Patient Details Name: Krystal Burgess MRN: [id number] DOB: July 25, 1961 Today's Date: 03/04/2021    History of Present Illness : Krystal Burgess is a 60 y.o. female with medical history significant for hypertension, hyperlipidemia, hypothyroidism, T2DM, chronic respiratory failure with hypoxia due to COPD (on supplemental oxygen at 2 LPM), CKD stage IIIb, morbid obesity and recent COVID-19 viral infection (in early February of this year) treated with outpatient remdesivir who presents to the emergency department via EMS due to 1 week onset of increasing shortness of breath.  Patient ambulates with a walker at baseline and lives with daughter who provides care for her.  This morning, she went to the bathroom and she felt a left lower quadrant pain which she thought was due to her ovary, she states that she felt lightheaded and fell without losing consciousness, daughter quickly came to her aid and activated EMS, on arrival of EMS team, she was noted to have an O2 sat of 85% on home 2 LPM of oxygen, patient complained of increased cough with occasional clear thick and sometimes liquidy sputum occasional blood-streaked.  She complained of headache, but denies fever, chills, chest pain, nausea or vomiting.   Clinical Impression   Pt agreeable to OT evaluation but request to not move too much due to pt having difficulty with bowel/bladder control due to current medications. Pt able to complete bed mobility supine to sit and sit to stand with marching at a level of Mod I donning 3 LPM O2. Pt dons 2 LPM O2 at home when sleeping or ambulating. Pt requires s/u to spv assist at baseline. Pt able to don socks with mod I at EOB. Pt not recommended for further acute OT due to being within functional limits for tasks assessed.     Follow Up Recommendations  No OT follow up;Supervision - Intermittent    Equipment Recommendations  None recommended by OT           Precautions /  Restrictions Precautions Precautions: Fall Restrictions Weight Bearing Restrictions: No      Mobility Bed Mobility                    Transfers Overall transfer level: Independent               General transfer comment: Sit to stand from EOB with marching. Donning 3LPM O2 during sit to stand and brief marching.    Balance Overall balance assessment: History of Falls;Independent                                         ADL either performed or assessed with clinical judgement   ADL Overall ADL's : Modified independent                                       General ADL Comments: Able to don socks at EOB with Mod I.     Vision Baseline Vision/History: Wears glasses Wears Glasses: At all times           Hand Dominance Right   Extremity/Trunk Assessment Upper Extremity Assessment Upper Extremity Assessment: Overall WFL for tasks assessed   Lower Extremity Assessment Lower Extremity Assessment: Defer to PT evaluation   Cervical / Trunk Assessment Cervical / Trunk Assessment: Normal   Communication  Communication Communication: No difficulties   Cognition Arousal/Alertness: Awake/alert Behavior During Therapy: WFL for tasks assessed/performed Overall Cognitive Status: Within Functional Limits for tasks assessed                                                      Home Living Family/patient expects to be discharged to:: Private residence Living Arrangements: Children Available Help at Discharge: Family Type of Home: House Home Access: Stairs to enter [employer] of Steps: 4 steps to enter house; 2 steps without rails to access her portion of the house. Has ramp access but does not typically use it. Entrance Stairs-Rails: Can reach both Home Layout: One level     Bathroom Shower/Tub: Walk-in shower (pt reported walk-in)   Bathroom Toilet: Handicapped height (Pt reports it being a  little too high) Bathroom Accessibility: Yes How Accessible: Other (comment) (Pt backs herself into sink or shower area with Rollator.) Home Equipment: Walker - 4 wheels;Shower seat - built in;Shower seat;Bedside commode;Grab bars - tub/shower;Hand held shower head          Prior Functioning/Environment Level of Independence: Needs assistance  Gait / Transfers Assistance Needed: Able to ambulate household distances with RW and O2 ADL's / Homemaking Assistance Needed: Reports level of set up to SPV assist at home.                          OT Goals(Current goals can be found in the care plan section) Acute Rehab OT Goals Patient Stated Goal: Return home to grandchildren.  OT Frequency:     Barriers to D/C:            Co-evaluation              AM-PAC OT "6 Clicks" Daily Activity     Outcome Measure                 End of Session Equipment Utilized During Treatment: Oxygen  Activity Tolerance: Patient tolerated treatment well Patient left: in bed;with call bell/phone within reach;with nursing/sitter in room (Doctor in room.)  OT Visit Diagnosis: History of falling (Z91.81)                Time: 3149-7026 OT Time Calculation (min): 18 min Charges:  OT General Charges $OT Visit: 1 Visit OT Evaluation $OT Eval Low Complexity: 1 Low  Dorn Hartshorne OT, MOT   Danie Chandler 03/04/2021, 10:04 AM

## 2021-03-05 ENCOUNTER — Other Ambulatory Visit: Payer: Self-pay | Admitting: Family Medicine

## 2021-03-07 LAB — CULTURE, BLOOD (ROUTINE X 2)
Culture: NO GROWTH
Culture: NO GROWTH
Special Requests: ADEQUATE
Special Requests: ADEQUATE

## 2021-03-10 ENCOUNTER — Encounter: Payer: Self-pay | Admitting: Family Medicine

## 2021-03-10 ENCOUNTER — Other Ambulatory Visit: Payer: Self-pay | Admitting: Family Medicine

## 2021-03-10 DIAGNOSIS — J329 Chronic sinusitis, unspecified: Secondary | ICD-10-CM

## 2021-03-12 DIAGNOSIS — M5417 Radiculopathy, lumbosacral region: Secondary | ICD-10-CM | POA: Diagnosis not present

## 2021-03-12 DIAGNOSIS — R202 Paresthesia of skin: Secondary | ICD-10-CM | POA: Diagnosis not present

## 2021-03-13 ENCOUNTER — Other Ambulatory Visit: Payer: Self-pay | Admitting: Physician Assistant

## 2021-03-18 ENCOUNTER — Encounter: Payer: Self-pay | Admitting: Family Medicine

## 2021-03-18 ENCOUNTER — Other Ambulatory Visit: Payer: Self-pay | Admitting: *Deleted

## 2021-03-18 DIAGNOSIS — G2581 Restless legs syndrome: Secondary | ICD-10-CM

## 2021-03-18 DIAGNOSIS — E1142 Type 2 diabetes mellitus with diabetic polyneuropathy: Secondary | ICD-10-CM

## 2021-03-18 DIAGNOSIS — M5136 Other intervertebral disc degeneration, lumbar region: Secondary | ICD-10-CM

## 2021-03-18 DIAGNOSIS — F3132 Bipolar disorder, current episode depressed, moderate: Secondary | ICD-10-CM

## 2021-03-27 ENCOUNTER — Encounter: Payer: Self-pay | Admitting: Family Medicine

## 2021-03-27 ENCOUNTER — Telehealth (INDEPENDENT_AMBULATORY_CARE_PROVIDER_SITE_OTHER): Payer: Medicare HMO | Admitting: Family Medicine

## 2021-03-27 DIAGNOSIS — I2583 Coronary atherosclerosis due to lipid rich plaque: Secondary | ICD-10-CM | POA: Diagnosis not present

## 2021-03-27 DIAGNOSIS — R9389 Abnormal findings on diagnostic imaging of other specified body structures: Secondary | ICD-10-CM

## 2021-03-27 DIAGNOSIS — J019 Acute sinusitis, unspecified: Secondary | ICD-10-CM | POA: Diagnosis not present

## 2021-03-27 DIAGNOSIS — I2510 Atherosclerotic heart disease of native coronary artery without angina pectoris: Secondary | ICD-10-CM

## 2021-03-27 DIAGNOSIS — E538 Deficiency of other specified B group vitamins: Secondary | ICD-10-CM | POA: Diagnosis not present

## 2021-03-27 DIAGNOSIS — R599 Enlarged lymph nodes, unspecified: Secondary | ICD-10-CM | POA: Diagnosis not present

## 2021-03-27 MED ORDER — FOLIC ACID 1 MG PO TABS: 1.0000 mg | ORAL_TABLET | Freq: Every day | ORAL | 1 refills | Status: DC

## 2021-03-27 MED ORDER — CEFDINIR 300 MG PO CAPS: 300.0000 mg | ORAL_CAPSULE | Freq: Two times a day (BID) | ORAL | 0 refills | Status: DC

## 2021-03-27 NOTE — Assessment & Plan Note (Signed)
Plan to repeat Ct around 06/02/2021. She still has some cough and congestion.  consider sooner repeat if not improving.

## 2021-03-27 NOTE — Progress Notes (Signed)
Pt reports that since being in the hospital she has been experiencing sinus sxs  Waking up w/headache, ear pain, nasal congestion R side is worse. Some green mucus at times  Taking BC sinus powders.   Denies any f/s/c/n/v/d/ body aches or sore throat. No sick contacts.

## 2021-03-27 NOTE — Assessment & Plan Note (Signed)
RF sent. Continue supplementation. Recheck in 1-2 months.

## 2021-03-27 NOTE — Progress Notes (Signed)
Virtual Visit via Video Note  I connected with Krystal Burgess on 03/27/21 at 10:10 AM EDT by a video enabled telemedicine application and verified that I am speaking with the correct person using two identifiers.   I discussed the limitations of evaluation and management by telemedicine and the availability of in person appointments. The patient expressed understanding and agreed to proceed.  Patient location: at home Provider location: in office  Subjective:    CC: sinus sxs   HPI:  Pt reports that since being in the hospital about 3 weeks ago,  she has been experiencing sinus sxs. She was dx with Pneumonia on the ED.  She is improving but still some SOB.   She has been using her nebulizer more than usual. Waking up w/headache, bilateral ear pain, nasal congestion R side is worse. Some green mucus at times.  Taking BC sinus powders. Right ear is worse.  Denies any f/s/c/n/v/d/ body aches or sore throat. No sick contacts.  COPD, severe - says she still can't get the portable oxygen.    She is not getting home health   F/U abnormal chest CT. She was told will need repeat CT in 3 months to make sure opacity has cleared and no underlying nodules.   She was also dx with folic acid def. She started supplement 3 weeks ago.    Narrative & Impression  CLINICAL DATA:  Syncope, rule out PE, recent COVID in February  EXAM: CT ANGIOGRAPHY CHEST WITH CONTRAST  TECHNIQUE: Multidetector CT imaging of the chest was performed using the standard protocol during bolus administration of intravenous contrast. Multiplanar CT image reconstructions and MIPs were obtained to evaluate the vascular anatomy.  CONTRAST:  4mL OMNIPAQUE IOHEXOL 350 MG/ML SOLN  COMPARISON:  CT chest, 01/23/2017  FINDINGS: Cardiovascular: Satisfactory opacification of the pulmonary arteries to the segmental level. No evidence of pulmonary embolism. Normal heart size. No pericardial effusion. Left coronary  artery calcifications. Aortic atherosclerosis.  Mediastinum/Nodes: There are newly enlarged pretracheal, subcarinal, and right hilar lymph nodes, measuring up to 2.8 x 1.9 cm (series 4, image 32). Thyroid gland, trachea, and esophagus demonstrate no significant findings.  Lungs/Pleura: There is clustered centrilobular nodular and heterogeneous airspace opacity, predominantly in the right lung, although seen throughout, with a more dense consolidation of the peripheral posterior right upper lobe (series 6, image 58). No pleural effusion or pneumothorax.  Upper Abdomen: No acute abnormality.  Musculoskeletal: No chest wall abnormality. No acute or significant osseous findings.  Review of the MIP images confirms the above findings.  IMPRESSION: 1. Negative examination for pulmonary embolism. 2. There is clustered centrilobular nodular and heterogeneous airspace opacity, predominantly in the right lung, although seen throughout, with a more dense consolidation of the peripheral posterior right upper lobe. Findings are consistent with multifocal infection. Recommend follow-up CT in 3 months to ensure complete resolution and exclude underlying malignancy. 3. There are newly enlarged pretracheal, subcarinal, and right hilar lymph nodes, nonspecific although likely reactive. 4. Coronary artery disease.  Aortic Atherosclerosis (ICD10-I70.0).   Electronically Signed   By: Lauralyn Primes M.D.   On: 03/02/2021 15:58     Past medical history, Surgical history, Family history not pertinant except as noted below, Social history, Allergies, and medications have been entered into the medical record, reviewed, and corrections made.   Review of Systems: No fevers, chills, night sweats, weight loss, chest pain, or shortness of breath.   Objective:    General: Speaking clearly in complete sentences without any  shortness of breath.  Alert and oriented x3.  Normal judgment. No  apparent acute distress.    Impression and Recommendations:    Abnormal chest CT Plan to repeat Ct around 06/02/2021. She still has some cough and congestion.  consider sooner repeat if not improving.   Folic acid deficiency RF sent. Continue supplementation. Recheck in 1-2 months.    Acute sinusitis - will tx with Omnicef.  Call if not better in one week. Continue nasal saline and mucinex.        Time spent in encounter 32 minutes  I discussed the assessment and treatment plan with the patient. The patient was provided an opportunity to ask questions and all were answered. The patient agreed with the plan and demonstrated an understanding of the instructions.   The patient was advised to call back or seek an in-person evaluation if the symptoms worsen or if the condition fails to improve as anticipated.   Nani Gasser, MD

## 2021-03-28 DIAGNOSIS — J449 Chronic obstructive pulmonary disease, unspecified: Secondary | ICD-10-CM | POA: Diagnosis not present

## 2021-04-24 ENCOUNTER — Encounter: Payer: Self-pay | Admitting: Family Medicine

## 2021-04-25 ENCOUNTER — Encounter: Payer: Self-pay | Admitting: Family Medicine

## 2021-04-26 ENCOUNTER — Other Ambulatory Visit: Payer: Self-pay | Admitting: Family Medicine

## 2021-04-26 ENCOUNTER — Other Ambulatory Visit: Payer: Self-pay | Admitting: Nurse Practitioner

## 2021-04-26 ENCOUNTER — Other Ambulatory Visit: Payer: Self-pay | Admitting: Physician Assistant

## 2021-04-26 MED ORDER — ALPRAZOLAM 1 MG PO TABS: 1.0000 mg | ORAL_TABLET | Freq: Three times a day (TID) | ORAL | 0 refills | Status: DC

## 2021-04-26 MED ORDER — SERTRALINE HCL 100 MG PO TABS: 200.0000 mg | ORAL_TABLET | Freq: Every day | ORAL | 0 refills | Status: DC

## 2021-04-26 MED ORDER — TRAZODONE HCL 100 MG PO TABS: 100.0000 mg | ORAL_TABLET | Freq: Every day | ORAL | 0 refills | Status: DC

## 2021-04-26 NOTE — Telephone Encounter (Signed)
I did send over prescriptions.  the Xanax I did only send enough for twice a day instead of 3 times a day.  Because she is on chronic narcotics she really should not be on chronic benzodiazepines there is a fourfold increased risk of death and combining both of those drugs.  So I did send over 60 tabs to just encourage her to try to use it sparingly or maybe even split the tabs if needed.  Just let us know if she needs an official referral for the new psychiatrist.

## 2021-04-27 DIAGNOSIS — J449 Chronic obstructive pulmonary disease, unspecified: Secondary | ICD-10-CM | POA: Diagnosis not present

## 2021-04-29 NOTE — Telephone Encounter (Signed)
Unfortunately, all of the narcotic pain medications have that same risk.  So we will just have to overtime work diligently on try to reduce the use of benzodiazepines to make sure that she is safe.

## 2021-05-03 ENCOUNTER — Encounter: Payer: Self-pay | Admitting: Family Medicine

## 2021-05-03 ENCOUNTER — Other Ambulatory Visit: Payer: Self-pay | Admitting: *Deleted

## 2021-05-03 DIAGNOSIS — J441 Chronic obstructive pulmonary disease with (acute) exacerbation: Secondary | ICD-10-CM

## 2021-05-03 DIAGNOSIS — J449 Chronic obstructive pulmonary disease, unspecified: Secondary | ICD-10-CM

## 2021-05-03 DIAGNOSIS — J9611 Chronic respiratory failure with hypoxia: Secondary | ICD-10-CM

## 2021-05-03 MED ORDER — AMBULATORY NON FORMULARY MEDICATION: 0 refills | Status: DC

## 2021-05-03 NOTE — Progress Notes (Unsigned)
Order for oxygen placed.

## 2021-05-08 ENCOUNTER — Other Ambulatory Visit: Payer: Self-pay | Admitting: Family Medicine

## 2021-05-12 ENCOUNTER — Other Ambulatory Visit: Payer: Self-pay | Admitting: Physician Assistant

## 2021-05-13 ENCOUNTER — Other Ambulatory Visit: Payer: Self-pay

## 2021-05-13 ENCOUNTER — Encounter (HOSPITAL_COMMUNITY): Payer: Self-pay | Admitting: Emergency Medicine

## 2021-05-13 ENCOUNTER — Emergency Department (HOSPITAL_COMMUNITY)
Admission: EM | Admit: 2021-05-13 | Discharge: 2021-05-13 | Disposition: A | Discharge status: Home / Self Care | Payer: Medicare Other | Attending: Emergency Medicine | Admitting: Emergency Medicine

## 2021-05-13 DIAGNOSIS — Z743 Need for continuous supervision: Secondary | ICD-10-CM | POA: Diagnosis not present

## 2021-05-13 DIAGNOSIS — X58XXXA Exposure to other specified factors, initial encounter: Secondary | ICD-10-CM | POA: Diagnosis not present

## 2021-05-13 DIAGNOSIS — Z5321 Procedure and treatment not carried out due to patient leaving prior to being seen by health care provider: Secondary | ICD-10-CM | POA: Diagnosis not present

## 2021-05-13 DIAGNOSIS — T171XXA Foreign body in nostril, initial encounter: Secondary | ICD-10-CM | POA: Diagnosis not present

## 2021-05-13 DIAGNOSIS — R6889 Other general symptoms and signs: Secondary | ICD-10-CM | POA: Diagnosis not present

## 2021-05-13 NOTE — ED Triage Notes (Signed)
Qtip broke off in left nare while cleaning the nose.

## 2021-05-14 ENCOUNTER — Encounter: Payer: Self-pay | Admitting: Family Medicine

## 2021-05-15 MED ORDER — ONDANSETRON 4 MG PO TBDP: 4.0000 mg | ORAL_TABLET | Freq: Three times a day (TID) | ORAL | 0 refills | Status: DC | PRN

## 2021-05-15 NOTE — Telephone Encounter (Signed)
Meds ordered this encounter  Medications  . ondansetron (ZOFRAN ODT) 4 MG disintegrating tablet    Sig: Take 1 tablet (4 mg total) by mouth every 8 (eight) hours as needed for nausea or vomiting.    Dispense:  12 tablet    Refill:  0

## 2021-05-26 ENCOUNTER — Other Ambulatory Visit: Payer: Self-pay | Admitting: Family Medicine

## 2021-05-28 ENCOUNTER — Telehealth (INDEPENDENT_AMBULATORY_CARE_PROVIDER_SITE_OTHER): Payer: Medicare Other | Admitting: Family Medicine

## 2021-05-28 DIAGNOSIS — J449 Chronic obstructive pulmonary disease, unspecified: Secondary | ICD-10-CM | POA: Diagnosis not present

## 2021-05-28 DIAGNOSIS — J019 Acute sinusitis, unspecified: Secondary | ICD-10-CM | POA: Diagnosis not present

## 2021-05-28 DIAGNOSIS — F3132 Bipolar disorder, current episode depressed, moderate: Secondary | ICD-10-CM | POA: Diagnosis not present

## 2021-05-28 DIAGNOSIS — R110 Nausea: Secondary | ICD-10-CM | POA: Diagnosis not present

## 2021-05-28 DIAGNOSIS — F5104 Psychophysiologic insomnia: Secondary | ICD-10-CM

## 2021-05-28 DIAGNOSIS — G2581 Restless legs syndrome: Secondary | ICD-10-CM | POA: Diagnosis not present

## 2021-05-28 MED ORDER — SERTRALINE HCL 100 MG PO TABS: 200.0000 mg | ORAL_TABLET | Freq: Every day | ORAL | 0 refills | Status: DC

## 2021-05-28 MED ORDER — CEFDINIR 300 MG PO CAPS
300.0000 mg | ORAL_CAPSULE | Freq: Two times a day (BID) | ORAL | 0 refills | Status: DC
Start: 2021-05-28 — End: 2021-06-06

## 2021-05-28 MED ORDER — ONDANSETRON 4 MG PO TBDP: 4.0000 mg | ORAL_TABLET | Freq: Three times a day (TID) | ORAL | 1 refills | Status: DC | PRN

## 2021-05-28 MED ORDER — TRAZODONE HCL 100 MG PO TABS: 100.0000 mg | ORAL_TABLET | Freq: Every day | ORAL | 0 refills | Status: DC

## 2021-05-28 MED ORDER — ALPRAZOLAM 1 MG PO TABS: 1.0000 mg | ORAL_TABLET | Freq: Two times a day (BID) | ORAL | 0 refills | Status: DC | PRN

## 2021-05-28 NOTE — Progress Notes (Signed)
Virtual Visit via Video Note  I connected with Krystal Burgess on 05/28/21 at 10:30 AM EDT by a video enabled telemedicine application and verified that I am speaking with the correct person using two identifiers.   I discussed the limitations of evaluation and management by telemedicine and the availability of in person appointments. The patient expressed understanding and agreed to proceed.  Patient location: at home Provider location: in office  Subjective:    CC: nausea and sinuses  HPI:  sxs began with her stomach on/off x 1 month. She gets nauseated sometimes when she eats. No vomiting. No matters what she eats. She did have some diarrhea associated with this last week but not this week. Daughter with similar sxs. She has been taking the Phenergan and Zofran for the nausea. She would like to get a refill of the Zofran if possible. She says evening drinking water upsets her stomach.    Her sinuses x 1 week. She has pain/pressure in her face and eyes. She's blowing out green/bloody mucus.feels like she can't breath through her nose at all.  Denies any f/s/c. She has had headaches no body aches. She has been taking BC sinus, Mucinex, and Sudafed.  She hasn't taken a COVID test. She hasn't been around anyone with COVID.   Bipolar disorder-she also wants to know if I would refill her psychiatric medications for another month she still has not been able to get her portable oxygen and so has not wanted to get out to go to additional providers.  Past medical history, Surgical history, Family history not pertinant except as noted below, Social history, Allergies, and medications have been entered into the medical record, reviewed, and corrections made.    Objective:    General: Speaking clearly in complete sentences without any shortness of breath.  Alert and oriented x3.  Normal judgment. No apparent acute distress.    Impression and Recommendations:    Bipolar disorder He has  been stable on her current regimen for quite some time I did go ahead and send refills but still encouraged her to schedule a new patient appointment with psychiatry.  Chronic insomnia Refilled her trazodone as well.  Acute sinusitis she does unfortunately get recurrent sinus infections.  Symptoms x1 week already without improvement.  We will go ahead and treat with Omnicef.  Call back if not better in 1 week.  Nausea with intermittent diarrhea-we discussed that is a little unusual to have this for a month at this point but she really feels like she is passing it back and forth between her and her daughter whom she lives with.  We discussed that if it persist for an additional week and we really need to get her referred to GI for further evaluation.  Zofran sent to pharmacy for as needed use.  She also has a follow-up with Dr. Estella Husk, her neurologist but will need a new referral placed for insurance purposes.  Again the appointment has already been scheduled.  Orders Placed This Encounter  Procedures  . Ambulatory referral to Neurology    Referral Priority:   Routine    Referral Type:   Consultation    Referral Reason:   Specialty Services Required    Requested Specialty:   Neurology    Number of Visits Requested:   1    Meds ordered this encounter  Medications  . ondansetron (ZOFRAN ODT) 4 MG disintegrating tablet    Sig: Take 1 tablet (4 mg total) by mouth every  8 (eight) hours as needed for nausea or vomiting.    Dispense:  12 tablet    Refill:  1  . cefdinir (OMNICEF) 300 MG capsule    Sig: Take 1 capsule (300 mg total) by mouth 2 (two) times daily.    Dispense:  14 capsule    Refill:  0  . ALPRAZolam (XANAX) 1 MG tablet    Sig: Take 1 tablet (1 mg total) by mouth 2 (two) times daily as needed. Pt reports taking 1 tablet PRN during the day and 1 tablet PRN QHS    Dispense:  60 tablet    Refill:  0  . sertraline (ZOLOFT) 100 MG tablet    Sig: Take 2 tablets (200 mg total) by  mouth daily.    Dispense:  180 tablet    Refill:  0  . traZODone (DESYREL) 100 MG tablet    Sig: Take 1 tablet (100 mg total) by mouth at bedtime.    Dispense:  90 tablet    Refill:  0     I discussed the assessment and treatment plan with the patient. The patient was provided an opportunity to ask questions and all were answered. The patient agreed with the plan and demonstrated an understanding of the instructions.   The patient was advised to call back or seek an in-person evaluation if the symptoms worsen or if the condition fails to improve as anticipated.   Nani Gasser, MD

## 2021-05-28 NOTE — Assessment & Plan Note (Signed)
Refilled her trazodone as well.

## 2021-05-28 NOTE — Progress Notes (Signed)
sxs began with her stomach on/off x 1 month. She gets nauseated sometimes when she eats. No matters what she eats. She did have some diarrhea associated with this last week but not this week.  She has been taking the Phenergan and Zofran for the nausea. She would like to get a refill of the Zofran if possible.    Her sinuses x 1 week. She has pain/pressure in her face and eyes. She's blowing out green/bloody mucus. Denies any f/s/c. She has had headaches no body aches. She has been taking BC sinus, Mucinex, and Sudafed.  She hasn't taken a COVID test. She hasn't been around anyone with COVID.

## 2021-05-28 NOTE — Assessment & Plan Note (Signed)
He has been stable on her current regimen for quite some time I did go ahead and send refills but still encouraged her to schedule a new patient appointment with psychiatry.

## 2021-06-04 ENCOUNTER — Other Ambulatory Visit: Payer: Self-pay

## 2021-06-04 ENCOUNTER — Encounter: Payer: Self-pay | Admitting: Family Medicine

## 2021-06-04 DIAGNOSIS — J441 Chronic obstructive pulmonary disease with (acute) exacerbation: Secondary | ICD-10-CM

## 2021-06-04 DIAGNOSIS — J9611 Chronic respiratory failure with hypoxia: Secondary | ICD-10-CM

## 2021-06-04 DIAGNOSIS — J449 Chronic obstructive pulmonary disease, unspecified: Secondary | ICD-10-CM

## 2021-06-04 MED ORDER — AMBULATORY NON FORMULARY MEDICATION: 0 refills | Status: DC

## 2021-06-05 ENCOUNTER — Encounter: Payer: Self-pay | Admitting: Family Medicine

## 2021-06-06 ENCOUNTER — Telehealth (INDEPENDENT_AMBULATORY_CARE_PROVIDER_SITE_OTHER): Payer: Medicare Other | Admitting: Physician Assistant

## 2021-06-06 VITALS — HR 77

## 2021-06-06 DIAGNOSIS — J019 Acute sinusitis, unspecified: Secondary | ICD-10-CM

## 2021-06-06 DIAGNOSIS — J441 Chronic obstructive pulmonary disease with (acute) exacerbation: Secondary | ICD-10-CM | POA: Diagnosis not present

## 2021-06-06 DIAGNOSIS — J9611 Chronic respiratory failure with hypoxia: Secondary | ICD-10-CM

## 2021-06-06 MED ORDER — DOXYCYCLINE HYCLATE 100 MG PO TABS: 100.0000 mg | ORAL_TABLET | Freq: Two times a day (BID) | ORAL | 0 refills | Status: AC

## 2021-06-06 MED ORDER — PREDNISONE 20 MG PO TABS: 20.0000 mg | ORAL_TABLET | Freq: Two times a day (BID) | ORAL | 0 refills | Status: AC

## 2021-06-06 NOTE — Telephone Encounter (Signed)
Virtual has been scheduled with Vinetta Bergamo for this afternoon. AM

## 2021-06-06 NOTE — Progress Notes (Signed)
Was in left side now in right. More throat clearing. Green mucous and has a weird taste and has heard some wheezing.

## 2021-06-06 NOTE — Progress Notes (Signed)
Telemedicine Visit via  Video & Audio (App used: Academic librarian) Audio only - telephone (patient preference /  technical difficulty with MyChart video application)  I connected with [name] on 06/06/21 at 9:19 AM  by phone or  telemedicine application as noted above  I verified that I am speaking with or regarding  the correct patient using two identifiers.  Participants: Myself, Krystal Fray PA-C Patient: Krystal Burgess   Patient is at home I am in office at [facility]    I discussed the limitations of evaluation and management  by telemedicine and the availability of in person appointments.  The participant(s) above expressed understanding and  agreed to proceed with this appointment via telemedicine.       History of Present Illness: Krystal Burgess is a 60 y.o. female who would like to discuss cough/congestion and persistent sinus symptoms  [name] is a pleasant 60 YO with PMH of COPD, chronic sinusitis, chronic respiratory failure Reports pain/pressure in her right maxillary sinus area that is moving down in to her chest Typically uses nebulizer twice a day, and is now needing 1-2 extra doses per day due to worsening dyspnea  She is on 2L O2 at rest and 4L with activity She is taking Trelegy nightly She is not taking Prednisone 5 mg daily Endorses a dry cough and overall not feeling well Endorses right ear discomfort and sensation fluid Denies fever, nightsweats, hemoptysis, chest pain  Hx of COVID-19 in February and PNA in March that required hospitalization She states this happens every year and she usually requires steroids  She was recently treated empirically with Omnicef 300 mg bid x 7 days - completed therapy on 06/04/21 and reports persistent symptoms   Observations/Objective: Pulse 77   SpO2 90%  BP Readings from Last 3 Encounters:  03/04/21 114/70  01/23/21 111/62  11/10/19 (!) 130/94   @LASTSAO2 (3)@   Exam: Gen: alert,  appears fatigued, non-toxic Pulm: wearing nasal cannula, speaking in full sentences   Lab and Radiology Results No results found for this or any previous visit (from the past 72 hour(s)). No results found.     Assessment and Plan: 60 y.o. female with The primary encounter diagnosis was Subacute sinusitis, unspecified location. Diagnoses of Chronic respiratory failure with hypoxia (HCC) and Chronic obstructive pulmonary disease with acute exacerbation (HCC) were also pertinent to this visit.   PDMP not reviewed this encounter. No orders of the defined types were placed in this encounter.  Meds ordered this encounter  Medications   doxycycline (VIBRA-TABS) 100 MG tablet    Sig: Take 1 tablet (100 mg total) by mouth 2 (two) times daily for 10 days.    Dispense:  20 tablet    Refill:  0    Order Specific Question:   Supervising Provider    Answer:   43 Sunnie Nielsen   predniSONE (DELTASONE) 20 MG tablet    Sig: Take 1 tablet (20 mg total) by mouth 2 (two) times daily with a meal for 5 days.    Dispense:  10 tablet    Refill:  0    Order Specific Question:   Supervising Provider    Answer:   [[id number]] Sunnie Nielsen   There are no Patient Instructions on file for this visit.  Patient is moderately hypoxic at 90% on 2L O2 via nasal cannula with baseline chronic respiratory failure. States her threshold to seek emergent care is below 88% and she does not appear in respiratory distress  on limited exam via video Counseled patient on strict ER precautions Prednisone burst 40 mg x 5 days Completed Omnicef 2 days ago - start Doxycycline to cover for ABRS and lower respiratory pathogens and consider Azithro for atypical coverage Would recommend patient have CXR and in-person evaluation if not responding to steroids and second antibiotic within 48 hrs  Instructions sent via MyChart.   Follow Up Instructions: No follow-ups on file.    I discussed the assessment and  treatment plan with the patient. The patient was provided an opportunity to ask questions and all were answered. The patient agreed with the plan and demonstrated an understanding of the instructions.   The patient was advised to call back or seek an in-person evaluation if any new concerns, if symptoms worsen or if the condition fails to improve as anticipated.  25 minutes of non-face-to-face time was provided during this encounter.      . . . . . . . . . . . . . Marland Kitchen                   Historical information moved to improve visibility of documentation.  Past Medical History:  Diagnosis Date   Allergy    Hyperlipidemia    Pain    Thyroid disease    Past Surgical History:  Procedure Laterality Date   ABDOMINAL HYSTERECTOMY     CESAREAN SECTION     CHOLECYSTECTOMY     lipoma removal     RT shoulder   LUMBAR SPINE SURGERY  2013   Dr. Manson Passey, L4-5   pilonydal cyst  1994   Social History   Tobacco Use   Smoking status: Former    Packs/day: 1.00    Pack years: 0.00    Types: Cigarettes    Quit date: 03/19/2020    Years since quitting: 1.2   Smokeless tobacco: Never  Substance Use Topics   Alcohol use: Not Currently   family history includes Cancer in her brother; Depression in her brother and mother; Diabetes in her brother and another family member; Hyperlipidemia in her mother; Hypertension in her father and mother; Pancreatic cancer in her mother.  Medications: Current Outpatient Medications  Medication Sig Dispense Refill   albuterol (VENTOLIN HFA) 108 (90 Base) MCG/ACT inhaler INHALE 2 PUFFS INTO THE LUNGS EVERY 4 HOURS AS NEEDED FOR WHEEZING OR SHORTNESS OF BREATH. 18 each 1   ALPRAZolam (XANAX) 1 MG tablet Take 1 tablet (1 mg total) by mouth 2 (two) times daily as needed. Pt reports taking 1 tablet PRN during the day and 1 tablet PRN QHS 60 tablet 0   AMBULATORY NON FORMULARY MEDICATION Medication Name: ONE TOUCH METER. CHECK BLOOD SUGAR  TWICE DAILY. DX:E11.9 1 each 0   AMBULATORY NON FORMULARY MEDICATION Medication Name: portable battery O2 concentrator 1 each 0   AMBULATORY NON FORMULARY MEDICATION Humidifier to use with oxygen 1 Device 0   AMBULATORY NON FORMULARY MEDICATION Medication Name: Home concentrator with humidifier. Portable battery powered Oxygen concentrator with tubing and supplies for both . Dx: COPD  Fax to [facility] [phone].  Dx Severe COPD 1 vial 0   amLODipine (NORVASC) 5 MG tablet TAKE 1 TABLET (5 MG TOTAL) BY MOUTH DAILY. 90 tablet 0   diclofenac sodium (VOLTAREN) 1 % GEL Apply 4 g topically 4 (four) times daily as needed (pain).  3   doxycycline (VIBRA-TABS) 100 MG tablet Take 1 tablet (100 mg total) by mouth 2 (two) times daily for 10 days.  20 tablet 0   fluticasone (FLONASE) 50 MCG/ACT nasal spray SPRAY 2 SPRAYS INTO EACH NOSTRIL EVERY DAY (Patient taking differently: Place 2 sprays into both nostrils daily.) 48 mL 1   Fluticasone-Umeclidin-Vilant (TRELEGY ELLIPTA) 100-62.5-25 MCG/INH AEPB Inhale 1 puff into the lungs daily. 60 each 4   gabapentin (NEURONTIN) 600 MG tablet Take 1 tablet (600 mg total) by mouth at bedtime. (Patient taking differently: Take 1,200 mg by mouth at bedtime.) 90 tablet 6   glucose blood test strip TEST TWICE A DAY. DX: E11.9 200 each 11   ipratropium (ATROVENT) 0.06 % nasal spray Place 2 sprays into both nostrils 4 (four) times daily. 45 mL 4   ipratropium-albuterol (DUONEB) 0.5-2.5 (3) MG/3ML SOLN Inhale 3 mLs into the lungs every 2 (two) hours as needed (wheezing or shortness of breath). 540 mL 6   JANUVIA 100 MG tablet TAKE 1 TABLET BY MOUTH EVERY DAY (Patient taking differently: Take 100 mg by mouth daily.) 90 tablet 0   levocetirizine (XYZAL) 5 MG tablet Take 1 tablet (5 mg total) by mouth every evening. 90 tablet 1   levothyroxine (SYNTHROID) 88 MCG tablet Take 1 tablet (88 mcg total) by mouth daily. 90 tablet 1   losartan (COZAAR) 100 MG tablet TAKE 1 TABLET BY MOUTH EVERY  DAY 90 tablet 0   mupirocin ointment (BACTROBAN) 2 % APPLY TO INSIDE OF EACH NARES DAILY FOR 10 DAYS THEN TWICE A WEEK FOR MAINTENANCE. 22 g 1   ondansetron (ZOFRAN ODT) 4 MG disintegrating tablet Take 1 tablet (4 mg total) by mouth every 8 (eight) hours as needed for nausea or vomiting. 12 tablet 1   ONETOUCH DELICA LANCETS 33G MISC USE TWICE DAILY. DX: E11.9 200 each 11   oxyCODONE-acetaminophen (PERCOCET) 7.5-325 MG tablet Take 1 tablet by mouth every 6 (six) hours as needed for moderate pain.     OXYGEN Inhale 4 L into the lungs.     pantoprazole (PROTONIX) 40 MG tablet Take 1 tablet (40 mg total) by mouth 2 (two) times daily. 180 tablet 1   predniSONE (DELTASONE) 20 MG tablet Take 1 tablet (20 mg total) by mouth 2 (two) times daily with a meal for 5 days. 10 tablet 0   predniSONE (DELTASONE) 5 MG tablet Take 5 mg by mouth daily with breakfast.     promethazine (PHENERGAN) 25 MG tablet TAKE 1 TABLET BY MOUTH 2 TIMES A DAY AS NEEDED FOR NAUSEA OR VOMITING 30 tablet 1   rosuvastatin (CRESTOR) 20 MG tablet Take 1 tablet (20 mg total) by mouth daily. 90 tablet 2   sertraline (ZOLOFT) 100 MG tablet Take 2 tablets (200 mg total) by mouth daily. 180 tablet 0   theophylline (THEODUR) 300 MG 12 hr tablet TAKE 1 TABLET (300 MG TOTAL) BY MOUTH 2 (TWO) TIMES DAILY. 180 tablet 1   tiZANidine (ZANAFLEX) 4 MG tablet TAKE 1 TABLET BY MOUTH TWICE A DAY AS NEEDED MUSCLE SPASMS 30 tablet 1   traZODone (DESYREL) 100 MG tablet Take 1 tablet (100 mg total) by mouth at bedtime. 90 tablet 0   naproxen (NAPROSYN) 500 MG tablet TAKE 1 TABLET BY MOUTH 2 TIMES DAILY WITH A MEAL. (Patient taking differently: Take 500 mg by mouth 2 (two) times daily with a meal.) 180 tablet 1   No current facility-administered medications for this visit.   Allergies  Allergen Reactions   Risperidone And Related Shortness Of Breath   Aripiprazole Other (See Comments)    REACTION: muscle jerks  Fluoxetine Hcl Other (See Comments)     REACTION: Intolerant   Nsaids Other (See Comments)    Upper GI bleed    Paroxetine Other (See Comments)    REACTION: Intolerance    Ziprasidone Hcl Other (See Comments)    shakes    Chantix [Varenicline Tartrate] Other (See Comments)    "messing with my mood"   Metformin And Related Nausea Only   Montelukast Other (See Comments)    "it kept me sick with an upper respiratory infection"   Onglyza [Saxagliptin] Nausea Only   Augmentin [Amoxicillin-Pot Clavulanate] Other (See Comments)    Gets yeast infection every time    Fluoxetine Hcl     REACTION: Intolerant     If phone visit, billing and coding can please add appropriate modifier if needed

## 2021-06-06 NOTE — Telephone Encounter (Signed)
If symptoms have worsened after antibiotics I would recommend that she have a follow up visit.

## 2021-06-10 ENCOUNTER — Other Ambulatory Visit: Payer: Self-pay | Admitting: Nurse Practitioner

## 2021-06-10 ENCOUNTER — Encounter: Payer: Self-pay | Admitting: Physician Assistant

## 2021-06-14 ENCOUNTER — Other Ambulatory Visit: Payer: Self-pay | Admitting: Nurse Practitioner

## 2021-06-14 DIAGNOSIS — J40 Bronchitis, not specified as acute or chronic: Secondary | ICD-10-CM

## 2021-06-14 DIAGNOSIS — J329 Chronic sinusitis, unspecified: Secondary | ICD-10-CM

## 2021-06-18 ENCOUNTER — Encounter: Payer: Self-pay | Admitting: Family Medicine

## 2021-06-18 DIAGNOSIS — M5412 Radiculopathy, cervical region: Secondary | ICD-10-CM | POA: Diagnosis not present

## 2021-06-18 DIAGNOSIS — F3132 Bipolar disorder, current episode depressed, moderate: Secondary | ICD-10-CM

## 2021-06-18 DIAGNOSIS — R110 Nausea: Secondary | ICD-10-CM | POA: Diagnosis not present

## 2021-06-18 DIAGNOSIS — G5603 Carpal tunnel syndrome, bilateral upper limbs: Secondary | ICD-10-CM | POA: Diagnosis not present

## 2021-06-18 DIAGNOSIS — M5442 Lumbago with sciatica, left side: Secondary | ICD-10-CM | POA: Diagnosis not present

## 2021-06-18 MED ORDER — ALPRAZOLAM 1 MG PO TABS: 1.0000 mg | ORAL_TABLET | Freq: Two times a day (BID) | ORAL | 0 refills | Status: DC | PRN

## 2021-06-18 MED ORDER — ONDANSETRON 4 MG PO TBDP: 4.0000 mg | ORAL_TABLET | Freq: Three times a day (TID) | ORAL | 1 refills | Status: DC | PRN

## 2021-06-18 NOTE — Telephone Encounter (Signed)
Med sent for xanax. RF placed for [facility] for virtual visits. Removed naproxen from meds list.

## 2021-06-27 DIAGNOSIS — J449 Chronic obstructive pulmonary disease, unspecified: Secondary | ICD-10-CM | POA: Diagnosis not present

## 2021-07-08 ENCOUNTER — Other Ambulatory Visit: Payer: Self-pay | Admitting: Physician Assistant

## 2021-07-08 ENCOUNTER — Other Ambulatory Visit: Payer: Self-pay | Admitting: Nurse Practitioner

## 2021-07-08 DIAGNOSIS — J329 Chronic sinusitis, unspecified: Secondary | ICD-10-CM

## 2021-07-10 ENCOUNTER — Other Ambulatory Visit: Payer: Self-pay | Admitting: *Deleted

## 2021-07-10 DIAGNOSIS — J9601 Acute respiratory failure with hypoxia: Secondary | ICD-10-CM

## 2021-07-10 DIAGNOSIS — J9602 Acute respiratory failure with hypercapnia: Secondary | ICD-10-CM

## 2021-07-10 DIAGNOSIS — J449 Chronic obstructive pulmonary disease, unspecified: Secondary | ICD-10-CM

## 2021-07-10 DIAGNOSIS — J441 Chronic obstructive pulmonary disease with (acute) exacerbation: Secondary | ICD-10-CM

## 2021-07-10 DIAGNOSIS — J9611 Chronic respiratory failure with hypoxia: Secondary | ICD-10-CM

## 2021-07-10 MED ORDER — AMBULATORY NON FORMULARY MEDICATION: 0 refills | Status: DC

## 2021-07-12 ENCOUNTER — Other Ambulatory Visit: Payer: Self-pay | Admitting: Family Medicine

## 2021-07-18 ENCOUNTER — Encounter: Payer: Self-pay | Admitting: Family Medicine

## 2021-07-18 ENCOUNTER — Telehealth (INDEPENDENT_AMBULATORY_CARE_PROVIDER_SITE_OTHER): Payer: Medicare Other | Admitting: Family Medicine

## 2021-07-18 DIAGNOSIS — J019 Acute sinusitis, unspecified: Secondary | ICD-10-CM

## 2021-07-18 DIAGNOSIS — F3132 Bipolar disorder, current episode depressed, moderate: Secondary | ICD-10-CM

## 2021-07-18 MED ORDER — ALPRAZOLAM 1 MG PO TABS: 1.0000 mg | ORAL_TABLET | Freq: Two times a day (BID) | ORAL | 0 refills | Status: DC | PRN

## 2021-07-18 MED ORDER — PREDNISONE 20 MG PO TABS: 40.0000 mg | ORAL_TABLET | Freq: Every day | ORAL | 0 refills | Status: DC

## 2021-07-18 MED ORDER — CEFDINIR 300 MG PO CAPS: 300.0000 mg | ORAL_CAPSULE | Freq: Two times a day (BID) | ORAL | 0 refills | Status: DC

## 2021-07-18 NOTE — Assessment & Plan Note (Signed)
I am happy to continue to refill her medications for the next several months.  Encouraged her to continue to work on trying to find access with psychiatry that is covered with her health insurance.  She might even want to see what might be considered in network versus out of network.

## 2021-07-18 NOTE — Progress Notes (Signed)
Virtual Visit via Video Note  I connected with [name] on 07/18/21 at  2:40 PM EDT by a video enabled telemedicine application and verified that I am speaking with the correct person using two identifiers.   I discussed the limitations of evaluation and management by telemedicine and the availability of in person appointments. The patient expressed understanding and agreed to proceed.  Patient location: at home Provider location: in office  Subjective:    CC: sinus sxs    HPI: Sinus sxs for several  weeks.  Pressure is immense when changes position over forehead and temples. Inside of nose if very tender.  No fever.  Some bilat ear pain. No Throat pain. Quit using her nasal saline bc last time used it had to force the rinse and her ears popped and caused her dizziness. Doesn't have any worsening chest sxs.    Still having a hard time getting psychiatry. She says her Neurologist id double [facility]inary [facility] for [facility]. She tried to get in somewhere  Past medical history, Surgical history, Family history not pertinant except as noted below, Social history, Allergies, and medications have been entered into the medical record, reviewed, and corrections made.    Objective:    General: Speaking clearly in complete sentences without any shortness of breath.  Alert and oriented x3.  Normal judgment. No apparent acute distress.    Impression and Recommendations:    Bipolar disorder I am happy to continue to refill her medications for the next several months.  Encouraged her to continue to work on trying to find access with psychiatry that is covered with her health insurance.  She might even want to see what might be considered in network versus out of network.  Acute sinusitis - will tx with Omnicef and prednisone since sxs are severe.  Call if not better in one week.  Bipolar disorder I am happy to continue to refill her medications for the next several months.  Encouraged her to  continue to work on trying to find access with psychiatry that is covered with her health insurance.  She might even want to see what might be considered in network versus out of network.      No orders of the defined types were placed in this encounter.   Meds ordered this encounter  Medications   predniSONE (DELTASONE) 20 MG tablet    Sig: Take 2 tablets (40 mg total) by mouth daily with breakfast.    Dispense:  10 tablet    Refill:  0   cefdinir (OMNICEF) 300 MG capsule    Sig: Take 1 capsule (300 mg total) by mouth 2 (two) times daily.    Dispense:  20 capsule    Refill:  0   ALPRAZolam (XANAX) 1 MG tablet    Sig: Take 1 tablet (1 mg total) by mouth 2 (two) times daily as needed. Pt reports taking 1 tablet PRN during the day and 1 tablet PRN QHS    Dispense:  60 tablet    Refill:  0     I discussed the assessment and treatment plan with the patient. The patient was provided an opportunity to ask questions and all were answered. The patient agreed with the plan and demonstrated an understanding of the instructions.   The patient was advised to call back or seek an in-person evaluation if the symptoms worsen or if the condition fails to improve as anticipated.   Nani Gasser, MD

## 2021-07-25 ENCOUNTER — Other Ambulatory Visit: Payer: Self-pay | Admitting: Family Medicine

## 2021-07-28 DIAGNOSIS — J449 Chronic obstructive pulmonary disease, unspecified: Secondary | ICD-10-CM | POA: Diagnosis not present

## 2021-08-02 ENCOUNTER — Other Ambulatory Visit: Payer: Self-pay | Admitting: Family Medicine

## 2021-08-02 ENCOUNTER — Other Ambulatory Visit: Payer: Self-pay | Admitting: Physician Assistant

## 2021-08-02 ENCOUNTER — Other Ambulatory Visit: Payer: Self-pay | Admitting: *Deleted

## 2021-08-02 ENCOUNTER — Encounter: Payer: Self-pay | Admitting: Family Medicine

## 2021-08-05 ENCOUNTER — Telehealth: Payer: Self-pay | Admitting: Neurology

## 2021-08-05 MED ORDER — NYSTATIN 100000 UNIT/GM EX POWD: 1.0000 "application " | Freq: Three times a day (TID) | CUTANEOUS | 0 refills | Status: DC

## 2021-08-05 NOTE — Addendum Note (Signed)
Addended by: Nani Gasser D on: 08/05/2021 05:07 PM   Modules accepted: Orders

## 2021-08-05 NOTE — Telephone Encounter (Signed)
Meds ordered this encounter  Medications   nystatin (MYCOSTATIN/NYSTOP) powder    Sig: Apply 1 application topically 3 (three) times daily.    Dispense:  60 g    Refill:  0

## 2021-08-05 NOTE — Telephone Encounter (Signed)
Patient left vm stating her cream for a rash was denied by [name] and she wanted Tayron Hunnell to send in the prescription.   I let patient know that [name] was her PCP and if she denied it and told her she needed an appt that I could not send it in under [facility] name.   She was very rude and upset. She states she can not come into the office because she doesn't have oxygen. She ended the phone call. Patient has not had an in office visit since 11/10/2019.  [name] - FYI.

## 2021-08-08 ENCOUNTER — Telehealth: Payer: Self-pay

## 2021-08-08 ENCOUNTER — Encounter: Payer: Self-pay | Admitting: Family Medicine

## 2021-08-08 NOTE — Telephone Encounter (Signed)
Opened in error.  See MyChart message.

## 2021-08-09 ENCOUNTER — Encounter: Payer: Self-pay | Admitting: Family Medicine

## 2021-08-09 DIAGNOSIS — S0035XA Superficial foreign body of nose, initial encounter: Secondary | ICD-10-CM

## 2021-08-09 NOTE — Telephone Encounter (Signed)
Please place urgent referral

## 2021-08-09 NOTE — Telephone Encounter (Signed)
See additional MyChart message for documentation.  Tiajuana Amass, CMA

## 2021-08-11 ENCOUNTER — Encounter: Payer: Self-pay | Admitting: Family Medicine

## 2021-08-12 NOTE — Telephone Encounter (Signed)
OK, can they get her in this week? Or can go to urgent Care

## 2021-08-13 ENCOUNTER — Encounter: Payer: Self-pay | Admitting: Family Medicine

## 2021-08-13 NOTE — Telephone Encounter (Signed)
Can you see if we can get her in this week.

## 2021-08-13 NOTE — Telephone Encounter (Signed)
Urgent referral placed for a 1 week appointment.

## 2021-08-14 ENCOUNTER — Telehealth: Payer: Self-pay | Admitting: Family Medicine

## 2021-08-14 MED ORDER — SULFAMETHOXAZOLE-TRIMETHOPRIM 800-160 MG PO TABS: 1.0000 | ORAL_TABLET | Freq: Two times a day (BID) | ORAL | 0 refills | Status: DC

## 2021-08-14 NOTE — Telephone Encounter (Signed)
I called [facility] and patient needs to speak with their billing department before they will schedule. I spoke with patient and she is aware and stated she is going to pay her bill to send the referral their anyway so I sent it. - CF

## 2021-08-14 NOTE — Telephone Encounter (Signed)
Antibiotic sent to pharmacy.  I am not can to send a steroid at this point because if it is a foreign body lodged then I would rather just treat with the antibiotics.  Hopefully she can get in later this week with ENT.  We can always refer her to another location if needed.

## 2021-08-14 NOTE — Telephone Encounter (Signed)
[name]   I called Sheana about her referral to let her know [facility] will not schedule her because she owes a bill she stated she is going to pay so to go ahead and send her referral but is asking if you will send her an antibiotic and steroid in for the infection until she can get scheduled with them.   Thank you Arline Asp

## 2021-08-15 NOTE — Telephone Encounter (Signed)
VM answered call, but did not allow to LVM.  Will try again.  Tiajuana Amass, CMA

## 2021-08-16 ENCOUNTER — Other Ambulatory Visit: Payer: Self-pay | Admitting: Family Medicine

## 2021-08-19 DIAGNOSIS — J342 Deviated nasal septum: Secondary | ICD-10-CM | POA: Diagnosis not present

## 2021-08-19 DIAGNOSIS — J3489 Other specified disorders of nose and nasal sinuses: Secondary | ICD-10-CM | POA: Diagnosis not present

## 2021-08-20 ENCOUNTER — Other Ambulatory Visit: Payer: Self-pay | Admitting: Family Medicine

## 2021-08-20 DIAGNOSIS — F3132 Bipolar disorder, current episode depressed, moderate: Secondary | ICD-10-CM

## 2021-08-20 NOTE — Telephone Encounter (Signed)
Since we have not been able to reach the pt by phone, spoke with Davina at the pharmacy who states that the pt did pick up the RX for Bactrim DS.  Tiajuana Amass, CMA

## 2021-08-27 ENCOUNTER — Other Ambulatory Visit: Payer: Self-pay | Admitting: Family Medicine

## 2021-08-28 DIAGNOSIS — J449 Chronic obstructive pulmonary disease, unspecified: Secondary | ICD-10-CM | POA: Diagnosis not present

## 2021-09-02 DIAGNOSIS — J441 Chronic obstructive pulmonary disease with (acute) exacerbation: Secondary | ICD-10-CM | POA: Diagnosis not present

## 2021-09-02 DIAGNOSIS — J449 Chronic obstructive pulmonary disease, unspecified: Secondary | ICD-10-CM | POA: Diagnosis not present

## 2021-09-05 ENCOUNTER — Other Ambulatory Visit: Payer: Self-pay | Admitting: Family Medicine

## 2021-09-09 NOTE — Telephone Encounter (Signed)
Left message with care taker to have her call and schedule an appt for f/u on thyroid & labs. AM

## 2021-09-09 NOTE — Telephone Encounter (Signed)
Please call pt and inform her that her thyriod hasn't been checked since 09/20/20 and she is OVERDUE for that and other labs. Will send 30 day supply

## 2021-09-10 ENCOUNTER — Telehealth (INDEPENDENT_AMBULATORY_CARE_PROVIDER_SITE_OTHER): Payer: Medicare Other | Admitting: Family Medicine

## 2021-09-10 ENCOUNTER — Encounter: Payer: Self-pay | Admitting: Family Medicine

## 2021-09-10 DIAGNOSIS — J019 Acute sinusitis, unspecified: Secondary | ICD-10-CM | POA: Diagnosis not present

## 2021-09-10 DIAGNOSIS — J441 Chronic obstructive pulmonary disease with (acute) exacerbation: Secondary | ICD-10-CM

## 2021-09-10 MED ORDER — PREDNISONE 20 MG PO TABS: ORAL_TABLET | ORAL | 0 refills | Status: AC

## 2021-09-10 MED ORDER — AZITHROMYCIN 250 MG PO TABS: ORAL_TABLET | ORAL | 0 refills | Status: AC

## 2021-09-10 NOTE — Progress Notes (Signed)
Virtual Visit via Video Note  I connected with Krystal Burgess on 09/10/21 at 11:10 AM EDT by a video enabled telemedicine application and verified that I am speaking with the correct person using two identifiers.   I discussed the limitations of evaluation and management by telemedicine and the availability of in person appointments. The patient expressed understanding and agreed to proceed.  Patient location: at home Provider location: in office  Subjective:    CC: Sick   HPI: 60 year old female with severe COPD, oxygen dependent complaining of not feeling well for about a week.  2 family members have sick for about 1.5 week and she started feeling sick about a week ago. members have tested for COVID 19 and strep and negative.  Family members have not been tested for flu.  + bodyaches.  Sinus congestion and sinuses burning with green nasal congestion.  Throat burning, cough.  Cough is worse at night.  Using her Neb machine more. Usually uses it BID but now QID.  Now using a budesonide rinse in her nose and mupirocin ointment. Ears are full and draining.   She herself has not tested for COVID but she is pretty much homebound and does not leave the house.   Past medical history, Surgical history, Family history not pertinant except as noted below, Social history, Allergies, and medications have been entered into the medical record, reviewed, and corrections made.    Objective:    General: Speaking clearly in complete sentences without any shortness of breath.  Alert and oriented x3.  Normal judgment. No apparent acute distress.    Impression and Recommendations:    No problem-specific Assessment & Plan notes found for this encounter. COPD exacerbation with acute sinusitis.  We will go ahead and treat with azithromycin and prednisone.  The last exacerbation was in July.  So she is mated about 2 months.  She said she felt like she was actually doing pretty well during that time until  family members became sick and then she started not feeling well.  So I do think this probably started as a viral illness but at this point she has been sick for over a week and is not really getting better.  Did encourage her to test for COVID in the future if she gets sick.  Because we do have antivirals available that can be really helpful.   No orders of the defined types were placed in this encounter.   Meds ordered this encounter  Medications   azithromycin (ZITHROMAX) 250 MG tablet    Sig: 2 Ttabs PO on Day 1, then one a day x 4 days.    Dispense:  6 tablet    Refill:  0   predniSONE (DELTASONE) 20 MG tablet    Sig: Take 2 tablets (40 mg total) by mouth daily with breakfast for 5 days, THEN 1 tablet (20 mg total) daily with breakfast for 3 days.    Dispense:  13 tablet    Refill:  0     I discussed the assessment and treatment plan with the patient. The patient was provided an opportunity to ask questions and all were answered. The patient agreed with the plan and demonstrated an understanding of the instructions.   The patient was advised to call back or seek an in-person evaluation if the symptoms worsen or if the condition fails to improve as anticipated.   Nani Gasser, MD

## 2021-09-19 ENCOUNTER — Encounter: Payer: Self-pay | Admitting: Family Medicine

## 2021-09-19 DIAGNOSIS — F3132 Bipolar disorder, current episode depressed, moderate: Secondary | ICD-10-CM

## 2021-09-20 MED ORDER — ALPRAZOLAM 1 MG PO TABS: 1.0000 mg | ORAL_TABLET | Freq: Two times a day (BID) | ORAL | 0 refills | Status: DC | PRN

## 2021-09-20 NOTE — Telephone Encounter (Signed)
The Zoloft and trazodone were refilled for 90-day supply, about 1 mo ago, so she should still have 60 days worth left so I did not refill those.  The Xanax was due so I did go ahead and send that.

## 2021-09-27 DIAGNOSIS — J449 Chronic obstructive pulmonary disease, unspecified: Secondary | ICD-10-CM | POA: Diagnosis not present

## 2021-10-02 DIAGNOSIS — J441 Chronic obstructive pulmonary disease with (acute) exacerbation: Secondary | ICD-10-CM | POA: Diagnosis not present

## 2021-10-02 DIAGNOSIS — J449 Chronic obstructive pulmonary disease, unspecified: Secondary | ICD-10-CM | POA: Diagnosis not present

## 2021-10-07 ENCOUNTER — Telehealth: Payer: Self-pay | Admitting: Family Medicine

## 2021-10-07 ENCOUNTER — Other Ambulatory Visit: Payer: Self-pay | Admitting: Family Medicine

## 2021-10-07 NOTE — Telephone Encounter (Signed)
Pt called.  She complains of headache, sinus pressure, hard to breathe, chest feels tight, and having to use her nebulizer more than usual. I spoke to [place] in Triage and she recommended that patient go to the ER. Patient declined going to the ER she states she has  been worse and she knows when to call 911.  Thanks.

## 2021-10-08 ENCOUNTER — Other Ambulatory Visit: Payer: Self-pay | Admitting: Family Medicine

## 2021-10-08 ENCOUNTER — Telehealth (INDEPENDENT_AMBULATORY_CARE_PROVIDER_SITE_OTHER): Payer: Medicare Other | Admitting: Physician Assistant

## 2021-10-08 ENCOUNTER — Encounter: Payer: Self-pay | Admitting: Physician Assistant

## 2021-10-08 VITALS — Wt 245.0 lb

## 2021-10-08 DIAGNOSIS — J9611 Chronic respiratory failure with hypoxia: Secondary | ICD-10-CM | POA: Diagnosis not present

## 2021-10-08 DIAGNOSIS — J441 Chronic obstructive pulmonary disease with (acute) exacerbation: Secondary | ICD-10-CM

## 2021-10-08 DIAGNOSIS — J322 Chronic ethmoidal sinusitis: Secondary | ICD-10-CM | POA: Diagnosis not present

## 2021-10-08 DIAGNOSIS — J449 Chronic obstructive pulmonary disease, unspecified: Secondary | ICD-10-CM

## 2021-10-08 MED ORDER — DOXYCYCLINE HYCLATE 100 MG PO TABS: 100.0000 mg | ORAL_TABLET | Freq: Two times a day (BID) | ORAL | 0 refills | Status: DC

## 2021-10-08 MED ORDER — PREDNISONE 20 MG PO TABS: ORAL_TABLET | ORAL | 0 refills | Status: DC

## 2021-10-08 NOTE — Progress Notes (Signed)
..Virtual Visit via Video Note  I connected with Krystal Burgess on 10/08/21 at  8:30 AM EDT by a video enabled telemedicine application and verified that I am speaking with the correct person using two identifiers.  Location: Patient: home Provider: clinic  .Krystal KitchenParticipating in visit:  Patient: Krystal Burgess Provider: [name] PA-C   I discussed the limitations of evaluation and management by telemedicine and the availability of in person appointments. The patient expressed understanding and agreed to proceed.  History of Present Illness: Pt is a 60 yo obese female with chronic sinusitis, COPD and chronic respiratory failure and former smoker who calls into the clinic with worsening sinus pressure, drainage, headache, cough and SOB. Last abx and prednisone was 9/20. She is on daily 5mg  prednisone. She does not smoke. She uses O2 with any ambulation. She is blowing out green sputum. Has ENT appt early November to talk about surgery. Head congestion and drainage is going into her chest and causing COPD flare. She is using double her albuterol that she was last week.   .. Active Ambulatory Problems    Diagnosis Date Noted   Hypothyroidism 02/20/2009   UNSPECIFIED VITAMIN D DEFICIENCY 07/13/2009   Hyperlipidemia 12/12/2008   Severe obesity (BMI >= 40) (HCC) 05/03/2009   Former smoker 12/12/2008   DEPRESSION 12/12/2008   Mononeuritis 12/12/2008   Essential hypertension 03/26/2009   ALLERGIC RHINITIS 08/17/2009   Intrinsic asthma 12/12/2008   COPD (chronic obstructive pulmonary disease) with chronic bronchitis (HCC) 12/12/2008   Spinal stenosis of lumbar region 12/12/2008   LEG EDEMA, BILATERAL 03/26/2009   Vitamin B 12 deficiency 06/24/2011   Steroid-induced diabetes (HCC) 01/06/2013   Bipolar disorder (HCC) 01/24/2013   Bilateral carpal tunnel syndrome 03/14/2013   Acute angle-closure glaucoma 10/03/2013   Type 2 diabetes mellitus (HCC) 09/07/2014   Lumbar degenerative disc disease  08/14/2016   Pulmonary nodule, RML, needs noncontrast CT in February 2019 01/26/2017   Chronic insomnia 01/30/2017   Chronic ethmoidal sinusitis 01/29/2018   RLS (restless legs syndrome) 01/14/2019   Chronic respiratory failure with hypoxia (HCC) 10/23/2017   CKD (chronic kidney disease) stage 3, GFR 30-59 ml/min (HCC) 10/26/2017   Combined forms of age-related cataract of 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   Cutaneous candidiasis 10/24/2017   Sinus tachycardia 04/08/2018   Posterior synechiae (iris), 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   Morbid obesity with BMI of 50.0-59.9, adult (HCC) 10/26/2017   Lower GI bleed 10/18/2014   Leukocytosis 10/08/2014   Hypomagnesemia 10/23/2017   Hypernatremia 04/08/2018   History of anxiety 10/08/2014   Glaucoma 10/23/2017   Gastroesophageal reflux disease without esophagitis 10/12/2014   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   Diabetic peripheral neuropathy associated with type 2 diabetes mellitus (HCC) 10/12/2014   Chronic back pain 10/24/2017   Acute respiratory failure with hypoxia and hypercapnia (HCC) 10/09/2014   Accidental fall 03/02/2021   Acute on chronic respiratory failure with hypoxia and hypercapnia (HCC) 03/02/2021   Elevated d-dimer 03/02/2021   Macrocytic anemia 03/02/2021   Peripheral neuropathy 03/02/2021   OSA (obstructive sleep apnea) 03/02/2021   Abnormal chest CT 03/27/2021   Folic acid deficiency 03/27/2021   Nausea 05/28/2021   Resolved Ambulatory Problems    Diagnosis Date Noted   HYPOKALEMIA 12/12/2008   ANKLE PAIN, LEFT 02/05/2009   SINUSITIS 02/20/2009   COPD (chronic obstructive pulmonary disease) (HCC) 09/09/2017   Acute non-recurrent pansinusitis 01/29/2018   Chronic maxillary sinusitis 08/17/2019   Urinary tract infection due to Enterococcus 10/16/2014   Pneumonia  of right upper lobe due to infectious organism 10/24/2017   Encephalopathy acute 10/12/2014   CAP (community acquired pneumonia) 03/02/2021    Abdominal pain 03/02/2021   Past Medical History:  Diagnosis Date   Allergy    Pain    Thyroid disease        Observations/Objective: No acute distress No wheezing.  Productive cough on exam with some short shallow breathing.   .. Today's Vitals   10/08/21 0825  Weight: 245 lb (111.1 kg)   Body mass index is 47.85 kg/m.    Assessment and Plan: Krystal KitchenMarland KitchenSherolyn was seen today for sinusitis and copd.  Diagnoses and all orders for this visit:  COPD exacerbation (HCC) -     predniSONE (DELTASONE) 20 MG tablet; Take 3 tablets for 3 days and then 2 tablets for 3 days, then 1 tablet for 3 days, then 1/2 tablet for 4 days. -     doxycycline (VIBRA-TABS) 100 MG tablet; Take 1 tablet (100 mg total) by mouth 2 (two) times daily.  Chronic ethmoidal sinusitis -     predniSONE (DELTASONE) 20 MG tablet; Take 3 tablets for 3 days and then 2 tablets for 3 days, then 1 tablet for 3 days, then 1/2 tablet for 4 days. -     doxycycline (VIBRA-TABS) 100 MG tablet; Take 1 tablet (100 mg total) by mouth 2 (two) times daily.  COPD (chronic obstructive pulmonary disease) with chronic bronchitis (HCC) -     predniSONE (DELTASONE) 20 MG tablet; Take 3 tablets for 3 days and then 2 tablets for 3 days, then 1 tablet for 3 days, then 1/2 tablet for 4 days. -     doxycycline (VIBRA-TABS) 100 MG tablet; Take 1 tablet (100 mg total) by mouth 2 (two) times daily.  Chronic respiratory failure with hypoxia (HCC) -     predniSONE (DELTASONE) 20 MG tablet; Take 3 tablets for 3 days and then 2 tablets for 3 days, then 1 tablet for 3 days, then 1/2 tablet for 4 days. -     doxycycline (VIBRA-TABS) 100 MG tablet; Take 1 tablet (100 mg total) by mouth 2 (two) times daily.  Last ABX was 9/20. Concerned about abx usage. She does have ENT appt to schedule surgery in November. She could be a candidate for alternating azithromycin. It seems most of her problems is sinuses that eventually drains into her lungs causing COPD  exacerbation.  Doxy and prednisone given.  Continue above prevention and management.  Follow up as needed.    Follow Up Instructions:    I discussed the assessment and treatment plan with the patient. The patient was provided an opportunity to ask questions and all were answered. The patient agreed with the plan and demonstrated an understanding of the instructions.   The patient was advised to call back or seek an in-person evaluation if the symptoms worsen or if the condition fails to improve as anticipated.   [name], PA-C

## 2021-10-09 DIAGNOSIS — J441 Chronic obstructive pulmonary disease with (acute) exacerbation: Secondary | ICD-10-CM | POA: Insufficient documentation

## 2021-10-13 ENCOUNTER — Other Ambulatory Visit: Payer: Self-pay | Admitting: Family Medicine

## 2021-10-17 ENCOUNTER — Encounter: Payer: Self-pay | Admitting: Family Medicine

## 2021-10-17 ENCOUNTER — Telehealth (INDEPENDENT_AMBULATORY_CARE_PROVIDER_SITE_OTHER): Payer: Medicare Other | Admitting: Family Medicine

## 2021-10-17 VITALS — Wt 245.0 lb

## 2021-10-17 DIAGNOSIS — J322 Chronic ethmoidal sinusitis: Secondary | ICD-10-CM

## 2021-10-17 DIAGNOSIS — R051 Acute cough: Secondary | ICD-10-CM

## 2021-10-17 MED ORDER — BENZONATATE 200 MG PO CAPS: 200.0000 mg | ORAL_CAPSULE | Freq: Three times a day (TID) | ORAL | 1 refills | Status: DC | PRN

## 2021-10-17 NOTE — Patient Instructions (Addendum)
I spoke with [name], and given the frequent prednisone and antibiotics you've been on recently, we do not want to add anymore quite yet. Since you are 50% better, that is improvement, but I want you to continue to monitor your symptoms. Be sure you are using your nasal sprays regularly, taking Mucinex twice a day, taking your allergy medications as prescribed. I will send in some Tessalon for you to take scheduled for the next several days to help prevent coughing spells which may be leading to your shortness of breath episodes. If you still have your flutter valve or an incentive spirometer, be sure you are using those throughout the day to keep your lungs functioning well. If your symptoms persist, you really need in-person evaluation so we can determine if we need any imaging. It might also be a good idea to see if you can get in with ENT any sooner. You can give them a call or let us know if you would like [place] to try.  Symptoms to watch for: fevers, confusion, lethargy, oxygen sats staying <88%, coughing up blood, difficulty breathing, chest pain, etc.   Please contact office for follow-up if symptoms do not improve or worsen. Seek emergency care if symptoms become severe.  Hope you feel better soon!! -Ladona Ridgel

## 2021-10-17 NOTE — Progress Notes (Signed)
Virtual Video Visit via MyChart Note  I connected with  Krystal Burgess on 10/17/21 at 11:10 AM EDT by the video enabled telemedicine application for MyChart, and verified that I am speaking with the correct person using two identifiers.   I introduced myself as a Publishing rights manager with the practice. We discussed the limitations of evaluation and management by telemedicine and the availability of in person appointments. The patient expressed understanding and agreed to proceed.  Participating parties in this visit include: The patient and the nurse practitioner listed.  The patient is: At home I am: In the office - [facility]  Subjective:    CC:  Chief Complaint  Patient presents with   URI    HPI: Krystal Burgess is a 60 y.o. year old female presenting today via MyChart today for cough, sinusitis symptoms.  06/06/21 virtual visit for sinusitis - treated with prednisone and doxycycline 06/28/21 virtual visit for sinusitis - treated with prednisone and cefdinir 09/10/21 virtual visit for sinusitis/COPD exacerbation - treated with prednisone taper and azithromycin 10/08/21 virtual visit for sinusitis/COPD exacerbation - treated with prednisone taper and doxycycline  Today patient reports she is only has one day left of prednisone and doxy from last appointment and her symptoms are still lingering. She states she is maybe 50% better, but tired of feeling like this and really wanting to be back to baseline. She reports her entire household has been passing around colds to each other for the past few months. She reports that she still has an occasionally productive cough, sinus pressure/pain, chest congestion, occasional shortness of breath and wheezing. Reports she is using her duoneb treatments up to 4x/day for the past 2-3 weeks. She is continuing all of her regularly prescribed allergy and COPD medications as ordered. She is still at 2L O2 via nasal canula at rest and up to 4L  with exertion - has not needed to increase her O2 with this illness. She denies any fevers, GI/GU symptoms, sore throat, chest pain. Reports she does still have some generalized body aches. She never tested for COVID, but states her daughter that she lives with tested negative with the same symptoms.   She is scheduled to see ENT in the next 2-3 weeks to consider sinus surgery and then planning to repeat allergy testing/shots.     Past medical history, Surgical history, Family history not pertinant except as noted below, Social history, Allergies, and medications have been entered into the medical record, reviewed, and corrections made.   Review of Systems:  All review of systems negative except what is listed in the HPI   Objective:    General:  Speaking clearly in complete sentences. Absent shortness of breath noted.  No coughing during visit. Alert and oriented x3.   Normal judgment.  Absent acute distress.   Impression and Recommendations:    1. Acute cough - benzonatate (TESSALON) 200 MG capsule; Take 1 capsule (200 mg total) by mouth 3 (three) times daily as needed for cough.  Dispense: 30 capsule; Refill: 1  2. Chronic ethmoidal sinusitis  Given the frequent prednisone and antibiotics you've been on recently, we do not want to add anymore quite yet. Since you are 50% better, that is improvement, but I want you to continue to monitor your symptoms. Be sure you are using your nasal sprays regularly, taking Mucinex twice a day, taking your allergy medications and inhaler/nebs as prescribed. I will send in some Tessalon for you to take scheduled for  the next several days to help prevent coughing spells which may be leading to your shortness of breath episodes. If you still have your flutter valve or an incentive spirometer, be sure you are using those throughout the day to keep your lungs functioning well. If your symptoms persist, you really need in-person evaluation so we can  determine if we need any imaging. It might also be a good idea to see if you can get in with ENT any sooner. You can give them a call or let us know if you would like [place] to try.  Symptoms to watch for: fevers, confusion, lethargy, oxygen sats staying <88%, coughing up blood, difficulty breathing, chest pain, etc.   Please contact office for follow-up if symptoms do not improve or worsen. Seek emergency care if symptoms become severe.    I discussed the assessment and treatment plan with the patient. The patient was provided an opportunity to ask questions and all were answered. The patient agreed with the plan and demonstrated an understanding of the instructions.   The patient was advised to call back or seek an in-person evaluation if the symptoms worsen or if the condition fails to improve as anticipated.  Discussed with PCP, [name]  I spent 20 minutes dedicated to the care of this patient on the date of this encounter to include pre-visit chart review of prior notes and results, face-to-face time with the patient, and post-visit ordering of testing as indicated.   Clayborne Dana, NP

## 2021-10-21 ENCOUNTER — Encounter: Payer: Self-pay | Admitting: Family Medicine

## 2021-10-21 DIAGNOSIS — F3132 Bipolar disorder, current episode depressed, moderate: Secondary | ICD-10-CM

## 2021-10-22 ENCOUNTER — Other Ambulatory Visit: Payer: Self-pay | Admitting: Family Medicine

## 2021-10-22 DIAGNOSIS — F3132 Bipolar disorder, current episode depressed, moderate: Secondary | ICD-10-CM

## 2021-10-22 NOTE — Telephone Encounter (Signed)
Med was already sent.

## 2021-10-29 ENCOUNTER — Other Ambulatory Visit: Payer: Self-pay | Admitting: Family Medicine

## 2021-10-29 DIAGNOSIS — G603 Idiopathic progressive neuropathy: Secondary | ICD-10-CM | POA: Diagnosis not present

## 2021-10-29 DIAGNOSIS — F3132 Bipolar disorder, current episode depressed, moderate: Secondary | ICD-10-CM

## 2021-10-29 DIAGNOSIS — R519 Headache, unspecified: Secondary | ICD-10-CM | POA: Diagnosis not present

## 2021-10-29 DIAGNOSIS — G5603 Carpal tunnel syndrome, bilateral upper limbs: Secondary | ICD-10-CM | POA: Diagnosis not present

## 2021-10-29 DIAGNOSIS — R202 Paresthesia of skin: Secondary | ICD-10-CM | POA: Diagnosis not present

## 2021-10-29 DIAGNOSIS — G2581 Restless legs syndrome: Secondary | ICD-10-CM | POA: Diagnosis not present

## 2021-10-29 DIAGNOSIS — M5412 Radiculopathy, cervical region: Secondary | ICD-10-CM | POA: Diagnosis not present

## 2021-10-29 DIAGNOSIS — M5417 Radiculopathy, lumbosacral region: Secondary | ICD-10-CM | POA: Diagnosis not present

## 2021-10-29 DIAGNOSIS — Z79899 Other long term (current) drug therapy: Secondary | ICD-10-CM | POA: Diagnosis not present

## 2021-11-02 DIAGNOSIS — J441 Chronic obstructive pulmonary disease with (acute) exacerbation: Secondary | ICD-10-CM | POA: Diagnosis not present

## 2021-11-02 DIAGNOSIS — J449 Chronic obstructive pulmonary disease, unspecified: Secondary | ICD-10-CM | POA: Diagnosis not present

## 2021-11-04 ENCOUNTER — Other Ambulatory Visit: Payer: Self-pay | Admitting: Family Medicine

## 2021-11-07 ENCOUNTER — Other Ambulatory Visit: Payer: Self-pay | Admitting: Family Medicine

## 2021-11-22 ENCOUNTER — Other Ambulatory Visit: Payer: Self-pay | Admitting: Nurse Practitioner

## 2021-11-22 ENCOUNTER — Other Ambulatory Visit: Payer: Self-pay | Admitting: Family Medicine

## 2021-11-26 NOTE — Telephone Encounter (Signed)
Miss Terri scheduled patient for appointment for 11/28/21 for PCP for any further refills on meds.

## 2021-11-26 NOTE — Telephone Encounter (Signed)
Spoke with patient and advised that appointment is needed for further refills on Prednisone. Patient agreed to schedule appointment. Transferred patient up front to schedule appointment.

## 2021-11-28 ENCOUNTER — Other Ambulatory Visit: Payer: Self-pay

## 2021-11-28 ENCOUNTER — Ambulatory Visit (INDEPENDENT_AMBULATORY_CARE_PROVIDER_SITE_OTHER): Payer: Medicare Other | Admitting: Family Medicine

## 2021-11-28 ENCOUNTER — Encounter: Payer: Self-pay | Admitting: Family Medicine

## 2021-11-28 VITALS — BP 138/81 | HR 77 | Temp 98.2°F | Resp 20 | Ht 60.0 in | Wt 241.0 lb

## 2021-11-28 DIAGNOSIS — E039 Hypothyroidism, unspecified: Secondary | ICD-10-CM

## 2021-11-28 DIAGNOSIS — J329 Chronic sinusitis, unspecified: Secondary | ICD-10-CM | POA: Diagnosis not present

## 2021-11-28 DIAGNOSIS — N1832 Chronic kidney disease, stage 3b: Secondary | ICD-10-CM | POA: Diagnosis not present

## 2021-11-28 DIAGNOSIS — E119 Type 2 diabetes mellitus without complications: Secondary | ICD-10-CM

## 2021-11-28 DIAGNOSIS — J449 Chronic obstructive pulmonary disease, unspecified: Secondary | ICD-10-CM | POA: Diagnosis not present

## 2021-11-28 DIAGNOSIS — I10 Essential (primary) hypertension: Secondary | ICD-10-CM | POA: Diagnosis not present

## 2021-11-28 DIAGNOSIS — K2100 Gastro-esophageal reflux disease with esophagitis, without bleeding: Secondary | ICD-10-CM | POA: Diagnosis not present

## 2021-11-28 DIAGNOSIS — K219 Gastro-esophageal reflux disease without esophagitis: Secondary | ICD-10-CM | POA: Diagnosis not present

## 2021-11-28 DIAGNOSIS — Z6841 Body Mass Index (BMI) 40.0 and over, adult: Secondary | ICD-10-CM

## 2021-11-28 DIAGNOSIS — Z23 Encounter for immunization: Secondary | ICD-10-CM

## 2021-11-28 LAB — POCT GLYCOSYLATED HEMOGLOBIN (HGB A1C): HbA1c, POC (controlled diabetic range): 5.9 % (ref 0.0–7.0)

## 2021-11-28 MED ORDER — PREDNISONE 5 MG PO TABS
2.5000 mg | ORAL_TABLET | Freq: Every day | ORAL | 1 refills | Status: DC
Start: 2021-11-28 — End: 2022-03-31

## 2021-11-28 MED ORDER — ROSUVASTATIN CALCIUM 20 MG PO TABS: 20.0000 mg | ORAL_TABLET | Freq: Every day | ORAL | 2 refills | Status: DC

## 2021-11-28 MED ORDER — LEVOTHYROXINE SODIUM 88 MCG PO TABS: 88.0000 ug | ORAL_TABLET | Freq: Every day | ORAL | 1 refills | Status: DC

## 2021-11-28 MED ORDER — PANTOPRAZOLE SODIUM 40 MG PO TBEC
40.0000 mg | DELAYED_RELEASE_TABLET | Freq: Every day | ORAL | 3 refills | Status: DC
Start: 2021-11-28 — End: 2022-11-05

## 2021-11-28 MED ORDER — CEFDINIR 300 MG PO CAPS
300.0000 mg | ORAL_CAPSULE | Freq: Two times a day (BID) | ORAL | 0 refills | Status: DC
Start: 2021-11-28 — End: 2021-12-11

## 2021-11-28 MED ORDER — TRELEGY ELLIPTA 100-62.5-25 MCG/ACT IN AEPB: 1.0000 | INHALATION_SPRAY | Freq: Every day | RESPIRATORY_TRACT | 6 refills | Status: DC

## 2021-11-28 NOTE — Assessment & Plan Note (Signed)
She has done well with gradually losing weight.  It might even continue to improve once we get her back on her thyroid medication.

## 2021-11-28 NOTE — Assessment & Plan Note (Signed)
Pressure just borderline elevated today.  We will keep an eye on it.

## 2021-11-28 NOTE — Progress Notes (Signed)
Established Patient Office Visit  Subjective:  Patient ID: Krystal Burgess, female    DOB: 1961-05-21  Age: 60 y.o. MRN: [id number]  CC:  Chief Complaint  Patient presents with   Medication Refill    Prednisone, Trelegy, Pulmicort. Shortness of Breath worsening.     HPI Krystal Burgess presents for   Diabetes - no hypoglycemic events. No wounds or sores that are not healing well. No increased thirst or urination.  That she has not taken her Januvia in probably over a year.  Has not had any symptoms of hyper or hypoglycemia.  Hypertension- Pt denies chest pain, SOB, dizziness, or heart palpitations.  Taking meds as directed w/o problems.  Denies medication side effects.    COPD-she also feels like she is having a COPD exacerbation.  Follow-up hypothyroidism-she is not actually taking her thyroid medication she says most of the time she misses it because she forgets to take her morning medicines.  She is now seeing Dr. Etter Sjogren. [place] for chronic pain management she is on oxycodone 10/325 3 times a day.  He is also taken over her alprazolam prescription.  Past Medical History:  Diagnosis Date   Allergy    Hyperlipidemia    Pain    Thyroid disease     Past Surgical History:  Procedure Laterality Date   ABDOMINAL HYSTERECTOMY     CESAREAN SECTION     CHOLECYSTECTOMY     lipoma removal     RT shoulder   LUMBAR SPINE SURGERY  2013   Dr. Owens Shark, L4-5   pilonydal cyst  1994    Family History  Problem Relation Age of Onset   Diabetes Brother    Depression Brother        bipolar   Cancer Brother        throat   Diabetes Other        grandmother   Depression Mother    Hyperlipidemia Mother    Hypertension Mother    Pancreatic cancer Mother    Hypertension Father     Social History   Socioeconomic History   Marital status: Divorced    Spouse name: Not on file   Number of children: Not on file   Years of education: Not on file   Highest education level: Not  on file  Occupational History   Not on file  Tobacco Use   Smoking status: Former    Packs/day: 1.00    Types: Cigarettes    Quit date: 03/19/2020    Years since quitting: 1.6   Smokeless tobacco: Never  Substance and Sexual Activity   Alcohol use: Not Currently   Drug use: No   Sexual activity: Not Currently    Comment: s with mother currentlynemployed, on disabilty, divorced, liver  Other Topics Concern   Not on file  Social History Narrative   Not on file   Social Determinants of Health   Financial Resource Strain: Not on file  Food Insecurity: Not on file  Transportation Needs: Not on file  Physical Activity: Not on file  Stress: Not on file  Social Connections: Not on file  Intimate Partner Violence: Not on file    Outpatient Medications Prior to Visit  Medication Sig Dispense Refill   albuterol (VENTOLIN HFA) 108 (90 Base) MCG/ACT inhaler INHALE 2 PUFFS INTO THE LUNGS EVERY 4 HOURS AS NEEDED FOR WHEEZING OR SHORTNESS OF BREATH. 18 each 1   ALPRAZolam (XANAX) 1 MG tablet Take 1 mg by  mouth in the morning, at noon, and at bedtime. Written by Dr. Trula Ore     AMBULATORY NON FORMULARY MEDICATION Medication Name: ONE TOUCH METER. CHECK BLOOD SUGAR TWICE DAILY. DX:E11.9 1 each 0   amLODipine (NORVASC) 5 MG tablet TAKE 1 TABLET (5 MG TOTAL) BY MOUTH DAILY. 90 tablet 0   B-D 3CC LUER-LOK SYR 25GX1" 25G X 1" 3 ML MISC Inject into the muscle once a week.     budesonide (PULMICORT) 0.5 MG/2ML nebulizer solution Take by nebulization.     cyanocobalamin (,VITAMIN B-12,) 1000 MCG/ML injection Inject 1,000 mcg into the muscle once a week.     diclofenac (VOLTAREN) 75 MG EC tablet Take 75 mg by mouth 2 (two) times daily.     fluticasone (FLONASE) 50 MCG/ACT nasal spray SPRAY 2 SPRAYS INTO EACH NOSTRIL EVERY DAY 48 mL 1   folic acid (FOLVITE) 1 MG tablet TAKE 1 TABLET BY MOUTH EVERY DAY 90 tablet 1   glucose blood test strip TEST TWICE A DAY. DX: E11.9 200 each 11   ipratropium  (ATROVENT) 0.06 % nasal spray Place 2 sprays into both nostrils 4 (four) times daily. 45 mL 4   ipratropium-albuterol (DUONEB) 0.5-2.5 (3) MG/3ML SOLN Inhale 3 mLs into the lungs every 2 (two) hours as needed (wheezing or shortness of breath). 540 mL 6   levocetirizine (XYZAL) 5 MG tablet TAKE 1 TABLET BY MOUTH EVERY DAY IN THE EVENING 90 tablet 1   losartan (COZAAR) 100 MG tablet TAKE 1 TABLET BY MOUTH EVERY DAY 90 tablet 0   mupirocin ointment (BACTROBAN) 2 % APPLY TO INSIDE OF EACH NARES DAILY FOR 10 DAYS THEN TWICE A WEEK FOR MAINTENANCE. 22 g 1   nystatin (MYCOSTATIN/NYSTOP) powder APPLY 1 APPLICATION TOPICALLY 3 (THREE) TIMES DAILY. 60 g 0   ONETOUCH DELICA LANCETS [id number] MISC USE TWICE DAILY. DX: E11.9 200 each 11   oxyCODONE-acetaminophen (PERCOCET) 7.5-325 MG tablet Take 1 tablet by mouth every 6 (six) hours as needed for moderate pain.     promethazine (PHENERGAN) 25 MG tablet TAKE 1 TABLET BY MOUTH 2 TIMES A DAY AS NEEDED FOR NAUSEA OR VOMITING 30 tablet 1   sertraline (ZOLOFT) 100 MG tablet TAKE 2 TABLETS BY MOUTH EVERY DAY 180 tablet 0   theophylline (THEODUR) 300 MG 12 hr tablet TAKE 1 TABLET BY MOUTH 2 TIMES DAILY. 180 tablet 1   tiZANidine (ZANAFLEX) 4 MG tablet TAKE 1 TABLET BY MOUTH TWICE A DAY AS NEEDED MUSCLE SPASMS 30 tablet 1   traZODone (DESYREL) 100 MG tablet TAKE 1 TABLET BY MOUTH EVERYDAY AT BEDTIME 90 tablet 0   ALPRAZolam (XANAX) 1 MG tablet TAKE 1 TABLET BY MOUTH 2 TIMES DAILY AS NEEDED. (Patient taking differently: Take 1 mg by mouth 3 (three) times daily as needed.) 60 tablet 1   AMBULATORY NON FORMULARY MEDICATION Medication Name: portable battery O2 concentrator 1 each 0   AMBULATORY NON FORMULARY MEDICATION Humidifier to use with oxygen 1 Device 0   AMBULATORY NON FORMULARY MEDICATION Medication Name: Home concentrator with humidifier. Portable battery powered Oxygen concentrator (4L) with tubing and supplies for both . Dx: COPD  Fax to [facility] [phone].  Dx Severe  COPD 1 vial 0   Fluticasone-Umeclidin-Vilant (TRELEGY ELLIPTA) 100-62.5-25 MCG/INH AEPB Inhale 1 puff into the lungs daily. 60 each 4   gabapentin (NEURONTIN) 600 MG tablet Take 1 tablet (600 mg total) by mouth at bedtime. (Patient taking differently: Take 1,200 mg by mouth at bedtime.) 90 tablet 6  ondansetron (ZOFRAN) 8 MG tablet Take 8 mg by mouth every 8 (eight) hours as needed.     OXYGEN Inhale 4 L into the lungs.     pantoprazole (PROTONIX) 40 MG tablet Take 1 tablet (40 mg total) by mouth 2 (two) times daily. 180 tablet 1   rosuvastatin (CRESTOR) 20 MG tablet Take 1 tablet (20 mg total) by mouth daily. 90 tablet 2   benzonatate (TESSALON) 200 MG capsule Take 1 capsule (200 mg total) by mouth 3 (three) times daily as needed for cough. 30 capsule 1   doxycycline (VIBRA-TABS) 100 MG tablet Take 1 tablet (100 mg total) by mouth 2 (two) times daily. 20 tablet 0   JANUVIA 100 MG tablet TAKE 1 TABLET BY MOUTH EVERY DAY (Patient not taking: Reported on 11/28/2021) 90 tablet 0   levothyroxine (SYNTHROID) 88 MCG tablet Take 1 tablet (88 mcg total) by mouth daily. 30 day supply sent. Must have labs and office visit for future refills of this and other medications.please call office and schedule. (Patient not taking: Reported on 11/28/2021) 30 tablet 0   predniSONE (DELTASONE) 20 MG tablet Take 3 tablets for 3 days and then 2 tablets for 3 days, then 1 tablet for 3 days, then 1/2 tablet for 4 days. 20 tablet 0   predniSONE (DELTASONE) 5 MG tablet TAKE 1 TABLET BY MOUTH EVERY DAY WITH BREAKFAST TO CONTINUE AFTER TAPER IS FINISHED 30 tablet 2   No facility-administered medications prior to visit.    Allergies  Allergen Reactions   Risperidone And Related Shortness Of Breath   Aripiprazole Other (See Comments)    REACTION: muscle jerks    Fluoxetine Hcl Other (See Comments)    REACTION: Intolerant   Nsaids Other (See Comments)    Upper GI bleed    Paroxetine Other (See Comments)    REACTION:  Intolerance    Ziprasidone Hcl Other (See Comments)    shakes    Chantix [Varenicline Tartrate] Other (See Comments)    "messing with my mood"   Metformin And Related Nausea Only   Montelukast Other (See Comments)    "it kept me sick with an upper respiratory infection"   Onglyza [Saxagliptin] Nausea Only   Augmentin [Amoxicillin-Pot Clavulanate] Other (See Comments)    Gets yeast infection every time    Fluoxetine Hcl     REACTION: Intolerant    ROS Review of Systems    Objective:    Physical Exam Constitutional:      Appearance: Normal appearance. She is well-developed.  HENT:     Head: Normocephalic and atraumatic.  Cardiovascular:     Rate and Rhythm: Normal rate and regular rhythm.     Heart sounds: Normal heart sounds.  Pulmonary:     Effort: Pulmonary effort is normal.  Skin:    General: Skin is warm and dry.  Neurological:     Mental Status: She is alert and oriented to person, place, and time.  Psychiatric:        Behavior: Behavior normal.    BP 138/81   Pulse 77   Temp 98.2 F (36.8 C)   Resp 20   Ht 5' (1.524 m)   Wt 241 lb (109.3 kg)   SpO2 93%   BMI 47.07 kg/m  Wt Readings from Last 3 Encounters:  11/28/21 241 lb (109.3 kg)  10/17/21 245 lb (111.1 kg)  10/08/21 245 lb (111.1 kg)     There are no preventive care reminders to display for this  patient.   There are no preventive care reminders to display for this patient.  Lab Results  Component Value Date   TSH >150.00 (H) 09/20/2020   Lab Results  Component Value Date   WBC 14.2 (H) 03/04/2021   HGB 9.3 (L) 03/04/2021   HCT 30.2 (L) 03/04/2021   MCV 99.3 03/04/2021   PLT 343 03/04/2021   Lab Results  Component Value Date   NA 139 03/04/2021   K 4.3 03/04/2021   CO2 29 03/04/2021   GLUCOSE 141 (H) 03/04/2021   BUN 16 03/04/2021   CREATININE 1.36 (H) 03/04/2021   BILITOT 0.6 03/03/2021   ALKPHOS 111 03/03/2021   AST 18 03/03/2021   ALT 11 03/03/2021   PROT 6.9  03/03/2021   ALBUMIN 3.1 (L) 03/03/2021   CALCIUM 8.9 03/04/2021   ANIONGAP 10 03/04/2021   Lab Results  Component Value Date   CHOL 126 09/20/2020   Lab Results  Component Value Date   HDL 52 09/20/2020   Lab Results  Component Value Date   LDLCALC 47 09/20/2020   Lab Results  Component Value Date   TRIG 208 (H) 09/20/2020   Lab Results  Component Value Date   CHOLHDL 2.4 09/20/2020   Lab Results  Component Value Date   HGBA1C 5.9 11/28/2021      Assessment & Plan:   Problem List Items Addressed This Visit       Cardiovascular and Mediastinum   Essential hypertension - Primary    Pressure just borderline elevated today.  We will keep an eye on it.      Relevant Medications   rosuvastatin (CRESTOR) 20 MG tablet   Other Relevant Orders   POCT glycosylated hemoglobin (Hb A1C) (Completed)   Lipid panel   COMPLETE METABOLIC PANEL WITH GFR   CBC with Differential/Platelet   Urine Microalbumin w/creat. ratio     Respiratory   COPD (chronic obstructive pulmonary disease) with chronic bronchitis (HCC)    She did request a refill on her Trelegy today was sent prescription to the pharmacy.  Her lungs actually sound great she has quit smoking x2 years now which is fantastic and congratulated her on that success.      Relevant Medications   budesonide (PULMICORT) 0.5 MG/2ML nebulizer solution   predniSONE (DELTASONE) 5 MG tablet   Fluticasone-Umeclidin-Vilant (TRELEGY ELLIPTA) 100-62.5-25 MCG/ACT AEPB   Other Relevant Orders   POCT glycosylated hemoglobin (Hb A1C) (Completed)   Lipid panel   COMPLETE METABOLIC PANEL WITH GFR   CBC with Differential/Platelet   Urine Microalbumin w/creat. ratio     Digestive   Gastroesophageal reflux disease without esophagitis    Original PPI prescription written for twice a day but she is not actually taking it consistently twice a day so we will try to go down to once a day.  New prescription sent.      Relevant Medications    pantoprazole (PROTONIX) 40 MG tablet     Endocrine   Type 2 diabetes mellitus (HCC)    A1c looks phenomenal today at 5.9 and she has been off of her Januvia.  Discontinued from her medication list continue work on healthy diet and regular exercise and try to stay active.  Follow-up in 6 months.      Relevant Medications   rosuvastatin (CRESTOR) 20 MG tablet   Other Relevant Orders   POCT glycosylated hemoglobin (Hb A1C) (Completed)   Lipid panel   COMPLETE METABOLIC PANEL WITH GFR   CBC with  Differential/Platelet   Urine Microalbumin w/creat. ratio   Hypothyroidism    Discussed the importance of taking her thyroid medication regularly.  She is really not able to take it consistently in the morning so okay to take at night.  We will recheck TSH today and then plan to recheck again in about 12 weeks.      Relevant Medications   levothyroxine (SYNTHROID) 88 MCG tablet   Other Relevant Orders   TSH     Genitourinary   CKD (chronic kidney disease) stage 3, GFR 30-59 ml/min (HCC)    Due to recheck renal function      Relevant Orders   POCT glycosylated hemoglobin (Hb A1C) (Completed)   Lipid panel   COMPLETE METABOLIC PANEL WITH GFR   CBC with Differential/Platelet   Urine Microalbumin w/creat. ratio     Other   BMI 45.0-49.9, adult ([place])    She has done well with gradually losing weight.  It might even continue to improve once we get her back on her thyroid medication.      Other Visit Diagnoses     Chronic sinusitis, unspecified location       Relevant Medications   cefdinir (OMNICEF) 300 MG capsule   predniSONE (DELTASONE) 5 MG tablet   Need for influenza vaccination       Relevant Orders   Flu Vaccine QUAD 6+ mos PF IM (Fluarix Quad PF) (Completed)   Gastroesophageal reflux disease with esophagitis without hemorrhage       Relevant Medications   pantoprazole (PROTONIX) 40 MG tablet       Meds ordered this encounter  Medications   cefdinir (OMNICEF) 300 MG  capsule    Sig: Take 1 capsule (300 mg total) by mouth 2 (two) times daily.    Dispense:  28 capsule    Refill:  0   predniSONE (DELTASONE) 5 MG tablet    Sig: Take 0.5 tablets (2.5 mg total) by mouth daily with breakfast.    Dispense:  30 tablet    Refill:  1   rosuvastatin (CRESTOR) 20 MG tablet    Sig: Take 1 tablet (20 mg total) by mouth daily.    Dispense:  90 tablet    Refill:  2   pantoprazole (PROTONIX) 40 MG tablet    Sig: Take 1 tablet (40 mg total) by mouth daily.    Dispense:  90 tablet    Refill:  3   levothyroxine (SYNTHROID) 88 MCG tablet    Sig: Take 1 tablet (88 mcg total) by mouth daily.    Dispense:  90 tablet    Refill:  1   Fluticasone-Umeclidin-Vilant (TRELEGY ELLIPTA) 100-62.5-25 MCG/ACT AEPB    Sig: Inhale 1 puff into the lungs daily.    Dispense:  1 each    Refill:  6    Follow-up: Return in about 6 months (around 05/29/2022) for Hypertension, Diabetes follow-up and recheck kidneys.    Beatrice Lecher, MD

## 2021-11-28 NOTE — Assessment & Plan Note (Signed)
Original PPI prescription written for twice a day but she is not actually taking it consistently twice a day so we will try to go down to once a day.  New prescription sent.

## 2021-11-28 NOTE — Assessment & Plan Note (Signed)
Due to recheck renal function. 

## 2021-11-28 NOTE — Assessment & Plan Note (Signed)
She did request a refill on her Trelegy today was sent prescription to the pharmacy.  Her lungs actually sound great she has quit smoking x2 years now which is fantastic and congratulated her on that success.

## 2021-11-28 NOTE — Progress Notes (Signed)
Patient has been off of Januvia and Levothyroxine for several months.   Patient has not had eye exam, pap smear, Mammogram.  Patient would like Shringrix and Flu vaccines today.

## 2021-11-28 NOTE — Assessment & Plan Note (Signed)
A1c looks phenomenal today at 5.9 and she has been off of her Januvia.  Discontinued from her medication list continue work on healthy diet and regular exercise and try to stay active.  Follow-up in 6 months.

## 2021-11-28 NOTE — Assessment & Plan Note (Signed)
Discussed the importance of taking her thyroid medication regularly.  She is really not able to take it consistently in the morning so okay to take at night.  We will recheck TSH today and then plan to recheck again in about 12 weeks.

## 2021-11-29 LAB — TSH: TSH: 20.01 mIU/L — ABNORMAL HIGH (ref 0.40–4.50)

## 2021-11-29 NOTE — Progress Notes (Signed)
Hi Krystal Burgess, total cholesterol and LDL look great.  Triglycerides are still up a little bit.  Just continue to work on [employer] and exercise.  There is stil some impairment to your kidney function but it is stable.  You are also still anemic.  Hemoglobin is 10.3 but it is up compared to last spring.  Thyroid level is 20 we want a get it all the way down to 2.  So just move your medicine to nighttime and start taking it regularly and let see if we can get that improved.

## 2021-11-30 LAB — CBC WITH DIFFERENTIAL/PLATELET
Absolute Monocytes: 1017 cells/uL — ABNORMAL HIGH (ref 200–950)
Basophils Absolute: 131 cells/uL (ref 0–200)
Basophils Relative: 1.6 %
Eosinophils Absolute: 508 cells/uL — ABNORMAL HIGH (ref 15–500)
Eosinophils Relative: 6.2 %
HCT: 32 % — ABNORMAL LOW (ref 35.0–45.0)
Hemoglobin: 10.3 g/dL — ABNORMAL LOW (ref 11.7–15.5)
Lymphs Abs: 2329 cells/uL (ref 850–3900)
MCH: 28.4 pg (ref 27.0–33.0)
MCHC: 32.2 g/dL (ref 32.0–36.0)
MCV: 88.2 fL (ref 80.0–100.0)
MPV: 10.1 fL (ref 7.5–12.5)
Monocytes Relative: 12.4 %
Neutro Abs: 4215 cells/uL (ref 1500–7800)
Neutrophils Relative %: 51.4 %
Platelets: 584 10*3/uL — ABNORMAL HIGH (ref 140–400)
RBC: 3.63 10*6/uL — ABNORMAL LOW (ref 3.80–5.10)
RDW: 13.5 % (ref 11.0–15.0)
Total Lymphocyte: 28.4 %
WBC: 8.2 10*3/uL (ref 3.8–10.8)

## 2021-11-30 LAB — COMPLETE METABOLIC PANEL WITH GFR
AG Ratio: 1.3 (calc) (ref 1.0–2.5)
ALT: 5 U/L — ABNORMAL LOW (ref 6–29)
AST: 8 U/L — ABNORMAL LOW (ref 10–35)
Albumin: 3.8 g/dL (ref 3.6–5.1)
Alkaline phosphatase (APISO): 83 U/L (ref 37–153)
BUN/Creatinine Ratio: 8 (calc) (ref 6–22)
BUN: 10 mg/dL (ref 7–25)
CO2: 24 mmol/L (ref 20–32)
Calcium: 9.1 mg/dL (ref 8.6–10.4)
Chloride: 97 mmol/L — ABNORMAL LOW (ref 98–110)
Creat: 1.25 mg/dL — ABNORMAL HIGH (ref 0.50–1.03)
Globulin: 3 g/dL (calc) (ref 1.9–3.7)
Glucose, Bld: 92 mg/dL (ref 65–99)
Potassium: 4.7 mmol/L (ref 3.5–5.3)
Sodium: 136 mmol/L (ref 135–146)
Total Bilirubin: 0.3 mg/dL (ref 0.2–1.2)
Total Protein: 6.8 g/dL (ref 6.1–8.1)
eGFR: 50 mL/min/{1.73_m2} — ABNORMAL LOW (ref >=60)

## 2021-11-30 LAB — LIPID PANEL
Cholesterol: 109 mg/dL (ref <200)
HDL: 46 mg/dL — ABNORMAL LOW (ref >=50)
LDL Cholesterol (Calc): 38 mg/dL (calc)
Non-HDL Cholesterol (Calc): 63 mg/dL (calc) (ref <130)
Total CHOL/HDL Ratio: 2.4 (calc) (ref <5.0)
Triglycerides: 175 mg/dL — ABNORMAL HIGH (ref <150)

## 2021-11-30 LAB — MICROALBUMIN / CREATININE URINE RATIO
Creatinine, Urine: 47 mg/dL (ref 20–275)
Microalb, Ur: <0.2 mg/dL

## 2021-12-01 ENCOUNTER — Other Ambulatory Visit: Payer: Self-pay | Admitting: Family Medicine

## 2021-12-03 NOTE — Progress Notes (Signed)
No excess protein in the urine which is great.

## 2021-12-09 ENCOUNTER — Encounter: Payer: Self-pay | Admitting: Family Medicine

## 2021-12-10 DIAGNOSIS — J449 Chronic obstructive pulmonary disease, unspecified: Secondary | ICD-10-CM | POA: Diagnosis not present

## 2021-12-11 ENCOUNTER — Other Ambulatory Visit: Payer: Self-pay | Admitting: *Deleted

## 2021-12-11 DIAGNOSIS — J449 Chronic obstructive pulmonary disease, unspecified: Secondary | ICD-10-CM

## 2021-12-11 MED ORDER — AMBULATORY NON FORMULARY MEDICATION: 0 refills | Status: DC

## 2021-12-23 ENCOUNTER — Other Ambulatory Visit: Payer: Self-pay | Admitting: Family Medicine

## 2021-12-23 DIAGNOSIS — E119 Type 2 diabetes mellitus without complications: Secondary | ICD-10-CM

## 2021-12-30 ENCOUNTER — Other Ambulatory Visit: Payer: Self-pay

## 2021-12-30 MED ORDER — AMBULATORY NON FORMULARY MEDICATION: 0 refills | Status: DC

## 2022-01-01 ENCOUNTER — Encounter: Payer: Self-pay | Admitting: Family Medicine

## 2022-01-01 ENCOUNTER — Telehealth (INDEPENDENT_AMBULATORY_CARE_PROVIDER_SITE_OTHER): Payer: Medicare Other | Admitting: Family Medicine

## 2022-01-01 DIAGNOSIS — J441 Chronic obstructive pulmonary disease with (acute) exacerbation: Secondary | ICD-10-CM

## 2022-01-01 DIAGNOSIS — E611 Iron deficiency: Secondary | ICD-10-CM | POA: Diagnosis not present

## 2022-01-01 DIAGNOSIS — E119 Type 2 diabetes mellitus without complications: Secondary | ICD-10-CM

## 2022-01-01 DIAGNOSIS — J019 Acute sinusitis, unspecified: Secondary | ICD-10-CM | POA: Diagnosis not present

## 2022-01-01 MED ORDER — PREDNISONE 20 MG PO TABS: ORAL_TABLET | ORAL | 0 refills | Status: AC

## 2022-01-01 MED ORDER — DOXYCYCLINE HYCLATE 100 MG PO TABS: 100.0000 mg | ORAL_TABLET | Freq: Two times a day (BID) | ORAL | 0 refills | Status: DC

## 2022-01-01 MED ORDER — FUSION PLUS PO CAPS: 1.0000 | ORAL_CAPSULE | Freq: Every day | ORAL | 5 refills | Status: DC

## 2022-01-01 MED ORDER — TIZANIDINE HCL 4 MG PO TABS: 4.0000 mg | ORAL_TABLET | Freq: Two times a day (BID) | ORAL | 1 refills | Status: DC | PRN

## 2022-01-01 NOTE — Assessment & Plan Note (Signed)
Referral placed for eye exam

## 2022-01-01 NOTE — Progress Notes (Signed)
Pt reports sxs x 2-3 days. R ear pain, wheezing, irritated throat. Not taking any OTC medications. She hasn't taken a Covid test no f/s/c. No color to mucus.

## 2022-01-01 NOTE — Progress Notes (Signed)
Virtual Visit via Video Note  I connected with Krystal Burgess on 01/01/22 at 10:50 AM EST by a video enabled telemedicine application and verified that I am speaking with the correct person using two identifiers.   I discussed the limitations of evaluation and management by telemedicine and the availability of in person appointments. The patient expressed understanding and agreed to proceed.  Patient location: at home Provider location: in office  Subjective:    CC:   Chief Complaint  Patient presents with   Sinusitis    HPI: Sinuses symptoms.  History of COPD she says it started about 6 days ago with cough and nasal congestion.  She has not had a fever but has had some chills and sweats.  She has had a mild sore throat.  Her right ear has really been bothering her though both are still uncomfortable she is producing a lot of thick excess phlegm that she feels like its not moving out of her throat and chest.  She has been wheezing and had to wake up at 6 AM to use her nebulizer because she woke up feeling short of breath and wheezing she says about a week ago she actually had gastroenteritis with vomiting and diarrhea but that is better.  He also wanted to discuss her iron.  She wants if there is anything else that she could do besides just the iron tabs they have been causing some constipation.   Past medical history, Surgical history, Family history not pertinant except as noted below, Social history, Allergies, and medications have been entered into the medical record, reviewed, and corrections made.    Objective:    General: Speaking clearly in complete sentences without any shortness of breath.  Alert and oriented x3.  Normal judgment. No apparent acute distress.    Impression and Recommendations:    Problem List Items Addressed This Visit       Respiratory   COPD exacerbation ([place])    COPD exacerbation-we will treat with doxycycline and prednisone.  It sounds like  she has some concomitant sinus symptoms as well which are often chronic and recurrent for her.  So hopefully this should help.  Call if not better if not improving.      Relevant Medications   doxycycline (VIBRA-TABS) 100 MG tablet   predniSONE (DELTASONE) 20 MG tablet     Endocrine   Type 2 diabetes mellitus ([place]) - Primary    Referral placed for eye exam.      Relevant Orders   Ambulatory referral to Ophthalmology     Other   Iron deficiency    We discussed that its not unusual for iron to cause constipation.  1 option would be to take it every other day it will just take longer to get the iron stores up.  We could also try a prescription called fusion plus which has a probiotic in it and tends to work really well to reduce GI symptoms from iron.  Would consider iron infusion if needed in the future.      Relevant Medications   Iron-FA-B Cmp-C-Biot-Probiotic (FUSION PLUS) CAPS   Other Visit Diagnoses     Acute non-recurrent sinusitis, unspecified location       Relevant Medications   doxycycline (VIBRA-TABS) 100 MG tablet   predniSONE (DELTASONE) 20 MG tablet       Orders Placed This Encounter  Procedures   Ambulatory referral to Ophthalmology    Referral Priority:   Routine  Referral Type:   Consultation    Referral Reason:   Specialty Services Required    Referred to Provider:   Leonia Corona, MD    Requested Specialty:   Ophthalmology    Number of Visits Requested:   1    Meds ordered this encounter  Medications   tiZANidine (ZANAFLEX) 4 MG tablet    Sig: Take 1 tablet (4 mg total) by mouth 2 (two) times daily as needed for muscle spasms.    Dispense:  30 tablet    Refill:  1   doxycycline (VIBRA-TABS) 100 MG tablet    Sig: Take 1 tablet (100 mg total) by mouth 2 (two) times daily.    Dispense:  14 tablet    Refill:  0   predniSONE (DELTASONE) 20 MG tablet    Sig: Take 2 tablets (40 mg total) by mouth daily with breakfast for 4 days, THEN 1 tablet (20  mg total) daily with breakfast for 4 days.    Dispense:  12 tablet    Refill:  0   Iron-FA-B Cmp-C-Biot-Probiotic (FUSION PLUS) CAPS    Sig: Take 1 capsule by mouth daily.    Dispense:  30 capsule    Refill:  5     I discussed the assessment and treatment plan with the patient. The patient was provided an opportunity to ask questions and all were answered. The patient agreed with the plan and demonstrated an understanding of the instructions.   The patient was advised to call back or seek an in-person evaluation if the symptoms worsen or if the condition fails to improve as anticipated.   Beatrice Lecher, MD

## 2022-01-01 NOTE — Assessment & Plan Note (Signed)
COPD exacerbation-we will treat with doxycycline and prednisone.  It sounds like she has some concomitant sinus symptoms as well which are often chronic and recurrent for her.  So hopefully this should help.  Call if not better if not improving.

## 2022-01-01 NOTE — Assessment & Plan Note (Signed)
We discussed that its not unusual for iron to cause constipation.  1 option would be to take it every other day it will just take longer to get the iron stores up.  We could also try a prescription called fusion plus which has a probiotic in it and tends to work really well to reduce GI symptoms from iron.  Would consider iron infusion if needed in the future.

## 2022-01-08 ENCOUNTER — Other Ambulatory Visit: Payer: Self-pay | Admitting: Family Medicine

## 2022-01-12 ENCOUNTER — Other Ambulatory Visit: Payer: Self-pay | Admitting: Family Medicine

## 2022-01-29 ENCOUNTER — Other Ambulatory Visit: Payer: Self-pay | Admitting: Family Medicine

## 2022-01-29 ENCOUNTER — Other Ambulatory Visit: Payer: Self-pay | Admitting: Nurse Practitioner

## 2022-01-29 DIAGNOSIS — J329 Chronic sinusitis, unspecified: Secondary | ICD-10-CM

## 2022-01-29 DIAGNOSIS — J40 Bronchitis, not specified as acute or chronic: Secondary | ICD-10-CM

## 2022-02-07 ENCOUNTER — Other Ambulatory Visit: Payer: Self-pay

## 2022-02-07 ENCOUNTER — Telehealth (INDEPENDENT_AMBULATORY_CARE_PROVIDER_SITE_OTHER): Payer: Medicare Other | Admitting: Sports Medicine

## 2022-02-07 ENCOUNTER — Encounter: Payer: Self-pay | Admitting: Sports Medicine

## 2022-02-07 DIAGNOSIS — J310 Chronic rhinitis: Secondary | ICD-10-CM

## 2022-02-07 DIAGNOSIS — J329 Chronic sinusitis, unspecified: Secondary | ICD-10-CM | POA: Diagnosis not present

## 2022-02-07 MED ORDER — FLUCONAZOLE 150 MG PO TABS: 150.0000 mg | ORAL_TABLET | Freq: Once | ORAL | 3 refills | Status: AC

## 2022-02-07 MED ORDER — AMOXICILLIN-POT CLAVULANATE 875-125 MG PO TABS: 1.0000 | ORAL_TABLET | Freq: Two times a day (BID) | ORAL | 0 refills | Status: AC

## 2022-02-07 MED ORDER — BUDESONIDE 0.5 MG/2ML IN SUSP: RESPIRATORY_TRACT | 11 refills | Status: DC

## 2022-02-07 MED ORDER — IPRATROPIUM BROMIDE 0.06 % NA SOLN: 2.0000 | Freq: Four times a day (QID) | NASAL | 11 refills | Status: DC

## 2022-02-07 NOTE — Progress Notes (Signed)
° °  Virtual Visit via Telephone   I connected with  Krystal Burgess  on 02/07/22 by telephone/telehealth and verified that I am speaking with the correct person using two identifiers.   I discussed the limitations, risks, security and privacy concerns of performing an evaluation and management service by telephone, including the higher likelihood of inaccurate diagnosis and treatment, and the availability of in person appointments.  We also discussed the likely need of an additional face to face encounter for complete and high quality delivery of care.  I also discussed with the patient that there may be a patient responsible charge related to this service. The patient expressed understanding and wishes to proceed.  Provider location is in medical facility. Patient location is at their home, different from provider location. People involved in care of the patient during this telehealth encounter were myself, my nurse/medical assistant, and my front office/scheduling team member.  Review of Systems: No fevers, chills, night sweats, weight loss, chest pain, or shortness of breath.   Objective Findings:    General: Speaking full sentences, no audible heavy breathing.  Sounds alert and appropriately interactive.    Independent interpretation of tests performed by another provider:   None.  Brief History, Exam, Impression, and Recommendations:    Rhinosinusitis This is a pleasant 61 year old female, she has a long history of facial pain and pressure, symptoms of nasal congestion and drainage, ear pressure, facial pressure. This has been very recurrent for her, she has been on inhaled budesonide. She had a CT maxillofacial back in 2019 that was for the most part normal with the exception of some nasal septal deviation.  No evidence of acute or chronic sinusitis. Recent history includes increasing facial pain and pressure without purulent nasal discharge, no fevers or chills, she does have  significant nasal congestion/stuffiness. Symptoms have been present now for a month. She did not mention cough but did tell me the symptoms are "going into her chest." Her symptoms do sound more like rhinosinusitis, I explained that swelling and bogginess of the nasal mucosa can block off the sinus ostia and this will give the sensation of facial pain, pressure. I will refill her budesonide, considering the 1 month duration we will use Augmentin with Diflucan. I have also suggested that she try some nasal Atrovent. She has an appointment coming up with [facility].   I discussed the above assessment and treatment plan with the patient. The patient was provided an opportunity to ask questions and all were answered. The patient agreed with the plan and demonstrated an understanding of the instructions.   The patient was advised to call back or seek an in-person evaluation if the symptoms worsen or if the condition fails to improve as anticipated.   I provided 30 minutes of verbal and non-verbal time during this encounter date, time was needed to gather information, review chart, records, communicate/coordinate with staff remotely, as well as complete documentation.   ___________________________________________ Ihor Austin. Benjamin Stain, M.D., ABFM., CAQSM. Primary Care and Sports Medicine [facility]  Adjunct Professor of Family Medicine  [employer]

## 2022-02-07 NOTE — Assessment & Plan Note (Signed)
This is a pleasant 61 year old female, she has a long history of facial pain and pressure, symptoms of nasal congestion and drainage, ear pressure, facial pressure. This has been very recurrent for her, she has been on inhaled budesonide. She had a CT maxillofacial back in 2019 that was for the most part normal with the exception of some nasal septal deviation.  No evidence of acute or chronic sinusitis. Recent history includes increasing facial pain and pressure without purulent nasal discharge, no fevers or chills, she does have significant nasal congestion/stuffiness. Symptoms have been present now for a month. She did not mention cough but did tell me the symptoms are "going into her chest." Her symptoms do sound more like rhinosinusitis, I explained that swelling and bogginess of the nasal mucosa can block off the sinus ostia and this will give the sensation of facial pain, pressure. I will refill her budesonide, considering the 1 month duration we will use Augmentin with Diflucan. I have also suggested that she try some nasal Atrovent. She has an appointment coming up with [facility].

## 2022-02-10 ENCOUNTER — Other Ambulatory Visit: Payer: Self-pay | Admitting: Family Medicine

## 2022-02-10 NOTE — Telephone Encounter (Signed)
Last appt 01/01/2022 Last RX written 01/01/2022 #30 with 1 refill Please advise.

## 2022-02-13 ENCOUNTER — Telehealth: Payer: Self-pay

## 2022-02-13 DIAGNOSIS — J4489 Other specified chronic obstructive pulmonary disease: Secondary | ICD-10-CM

## 2022-02-13 DIAGNOSIS — J449 Chronic obstructive pulmonary disease, unspecified: Secondary | ICD-10-CM

## 2022-02-13 NOTE — Telephone Encounter (Signed)
Pt called in regards to having a reaction to medication that she was prescribe pt did not specify what medication is causing her to be short of breath, pt would like a steroid to help open her air ways to breath better.

## 2022-02-13 NOTE — Telephone Encounter (Signed)
Krystal Burgess called and left a message stating the congestion has now gone to her chest. She sent a message to Dr Benjamin Stain through MyChart for prednisone 20 mg. She would like the message sent to Dr Linford Arnold.

## 2022-02-14 MED ORDER — PREDNISONE 50 MG PO TABS: 50.0000 mg | ORAL_TABLET | Freq: Every day | ORAL | 0 refills | Status: DC

## 2022-02-14 MED ORDER — PREDNISONE 20 MG PO TABS: ORAL_TABLET | ORAL | 0 refills | Status: AC

## 2022-02-14 NOTE — Telephone Encounter (Signed)
Patient made aware RX sent.  

## 2022-02-14 NOTE — Telephone Encounter (Signed)
I have added a 5-day burst of steroids.

## 2022-02-14 NOTE — Telephone Encounter (Signed)
Meds ordered this encounter  ?Medications  ? predniSONE (DELTASONE) 20 MG tablet  ?  Sig: Take 2 tablets (40 mg total) by mouth daily with breakfast for 4 days, THEN 1 tablet (20 mg total) daily with breakfast for 4 days.  ?  Dispense:  12 tablet  ?  Refill:  0  ? ? ?

## 2022-02-15 ENCOUNTER — Other Ambulatory Visit: Payer: Self-pay | Admitting: Family Medicine

## 2022-02-15 DIAGNOSIS — F3132 Bipolar disorder, current episode depressed, moderate: Secondary | ICD-10-CM

## 2022-03-03 ENCOUNTER — Encounter: Payer: Self-pay | Admitting: Family Medicine

## 2022-03-03 ENCOUNTER — Other Ambulatory Visit: Payer: Self-pay | Admitting: Nurse Practitioner

## 2022-03-03 ENCOUNTER — Other Ambulatory Visit: Payer: Self-pay | Admitting: Family Medicine

## 2022-03-03 DIAGNOSIS — J329 Chronic sinusitis, unspecified: Secondary | ICD-10-CM

## 2022-03-03 DIAGNOSIS — J40 Bronchitis, not specified as acute or chronic: Secondary | ICD-10-CM

## 2022-03-03 MED ORDER — NYSTATIN 100000 UNIT/ML MT SUSP: 5.0000 mL | Freq: Four times a day (QID) | OROMUCOSAL | 0 refills | Status: DC

## 2022-03-03 NOTE — Telephone Encounter (Signed)
Meds ordered this encounter  ?Medications  ? nystatin (MYCOSTATIN) 100000 UNIT/ML suspension  ?  Sig: Take 5 mLs (500,000 Units total) by mouth 4 (four) times daily. X 7 days  ?  Dispense:  60 mL  ?  Refill:  0  ? ? ?

## 2022-03-05 ENCOUNTER — Other Ambulatory Visit: Payer: Self-pay | Admitting: Family Medicine

## 2022-03-05 MED ORDER — GABAPENTIN 600 MG PO TABS: 600.0000 mg | ORAL_TABLET | Freq: Every day | ORAL | 1 refills | Status: DC

## 2022-03-05 NOTE — Progress Notes (Signed)
Call patient.  If she is using Trelegy then she needs to discontinue the Pulmicort.  They are overlapping with each other.  Plus she already has DuoNeb. ? ?Meds ordered this encounter  ?Medications  ? gabapentin (NEURONTIN) 600 MG tablet  ?  Sig: Take 1 tablet (600 mg total) by mouth at bedtime.  ?  Dispense:  90 tablet  ?  Refill:  1  ? ? ?

## 2022-03-06 ENCOUNTER — Other Ambulatory Visit: Payer: Self-pay | Admitting: Family Medicine

## 2022-03-06 ENCOUNTER — Telehealth (INDEPENDENT_AMBULATORY_CARE_PROVIDER_SITE_OTHER): Payer: Medicare Other | Admitting: Family Medicine

## 2022-03-06 DIAGNOSIS — B370 Candidal stomatitis: Secondary | ICD-10-CM | POA: Diagnosis not present

## 2022-03-06 DIAGNOSIS — J329 Chronic sinusitis, unspecified: Secondary | ICD-10-CM | POA: Diagnosis not present

## 2022-03-06 MED ORDER — NYSTATIN 100000 UNIT/ML MT SUSP: 5.0000 mL | Freq: Four times a day (QID) | OROMUCOSAL | 0 refills | Status: DC

## 2022-03-06 MED ORDER — PREDNISONE 20 MG PO TABS: ORAL_TABLET | ORAL | 0 refills | Status: AC

## 2022-03-06 MED ORDER — LEVOFLOXACIN 500 MG PO TABS: 500.0000 mg | ORAL_TABLET | Freq: Every day | ORAL | 0 refills | Status: AC

## 2022-03-06 MED ORDER — FLUCONAZOLE 150 MG PO TABS: 150.0000 mg | ORAL_TABLET | Freq: Once | ORAL | 0 refills | Status: AC

## 2022-03-06 NOTE — Progress Notes (Signed)
Pt reports that she has been going thru this for some time she stated that she has tried everything that she was told to do previously by Dr. Madilyn Fireman, Dr. Darene Lamer and the ENT. She has been trying to get an appointment w/ENT but has been sick every time she has had an appointment scheduled with their office.  ? ?Pt now reports that the cold has moved into her chest she thinks she has "CROUP" she said that it reminds her when she was younger it has taste. ? ?She stated that she has been taking her medications and OTC meds. She also c/o thrush. ? ?In addition pt wanted to inform Dr. Madilyn Fireman that she continues to struggle w/constipation.  She has been using stool softeners, and other laxative and nothing has helped  ? ? ?

## 2022-03-06 NOTE — Progress Notes (Signed)
? ? ?  Virtual Visit via Video Note ? ?I connected with [name] on 03/06/22 at  1:00 PM EDT by a video enabled telemedicine application and verified that I am speaking with the correct person using two identifiers. ?  ?I discussed the limitations of evaluation and management by telemedicine and the availability of in person appointments. The patient expressed understanding and agreed to proceed. ? ?Patient location: at home ?Provider location: in office ? ?Subjective:   ? ?CC:  No chief complaint on file. ? ? ?HPI: ? ? ? ?Past medical history, Surgical history, Family history not pertinant except as noted below, Social history, Allergies, and medications have been entered into the medical record, reviewed, and corrections made.  ? ? ?Objective:   ? ?General: Speaking clearly in complete sentences without any shortness of breath.  Alert and oriented x3.  Normal judgment. No apparent acute distress. ? ? ? ?Impression and Recommendations:   ? ?Problem List Items Addressed This Visit   ?None ? ? ?No orders of the defined types were placed in this encounter. ? ? ?No orders of the defined types were placed in this encounter. ? ? ? ?I discussed the assessment and treatment plan with the patient. The patient was provided an opportunity to ask questions and all were answered. The patient agreed with the plan and demonstrated an understanding of the instructions. ?  ?The patient was advised to call back or seek an in-person evaluation if the symptoms worsen or if the condition fails to improve as anticipated. ? ? ?Nani Gasser, MD  ? ?

## 2022-03-06 NOTE — Progress Notes (Signed)
? ? ?Virtual Visit via Video Note ? ?I connected with Krystal Burgess on 03/06/22 at  1:00 PM EDT by a video enabled telemedicine application and verified that I am speaking with the correct person using two identifiers. ?  ?I discussed the limitations of evaluation and management by telemedicine and the availability of in person appointments. The patient expressed understanding and agreed to proceed. ? ?Patient location: at home ?Provider location: in office ? ?Subjective:   ? ?CC:   ?Chief Complaint  ?Patient presents with  ? Sinusitis  ? COPD  ? ? ? ?HPI: ?Pt reports that she has been going thru this for some time she stated that she has tried everything that she was told to do previously by [name], Dr. Karie Schwalbe and the ENT. She has been trying to get an appointment w/ENT but has been sick every time she has had an appointment scheduled with their office.  ?  ?Pt now reports that the cold has moved into her chest she thinks she has "CROUP" she said that it reminds her when she was younger it has taste. ?  ?She stated that she has been taking her medications and OTC meds. She also c/o thrush. ?  ?In addition pt wanted to inform [name] that she continues to struggle w/constipation.  She has been using stool softeners, and other laxative and nothing has helped  ? ?Her body hurts. Sinuses painful, hurst and swollen.  Mouth is sore from thrush. Has been more SOB last 3-4 days.  ? ?Past medical history, Surgical history, Family history not pertinant except as noted below, Social history, Allergies, and medications have been entered into the medical record, reviewed, and corrections made.  ? ? ?Objective:   ? ?General: Speaking clearly in complete sentences without any shortness of breath.  Alert and oriented x3.  Normal judgment. No apparent acute distress. ? ? ? ?Impression and Recommendations:   ? ?Problem List Items Addressed This Visit   ?None ?Visit Diagnoses   ? ? Chronic sinusitis, unspecified location     -  Primary  ? Relevant Medications  ? fluconazole (DIFLUCAN) 150 MG tablet  ? levofloxacin (LEVAQUIN) 500 MG tablet  ? predniSONE (DELTASONE) 20 MG tablet  ? Thrush      ? Relevant Medications  ? fluconazole (DIFLUCAN) 150 MG tablet  ? nystatin (MYCOSTATIN) 100000 UNIT/ML suspension  ? ?  ? ? ?No orders of the defined types were placed in this encounter. ?Chronic sinusitis-she just continues to get back-to-back sinus infections.  She is already been told that she really needs to have surgery by ENT but keeps unfortunately having to reschedule her appointments because she is sick and cannot make it in distress the importance of truly trying to keep that next appointment and that I am very concerned about the frequent back-to-back use of antibiotics.  At this point though I am going to err on the side of a flora quinolone to see if we cannot really get this taken care of for at least for a little while so she can get well enough to see the ENT.  We will go ahead and treat for 14 days as well as a round of prednisone she would prefer a taper she felt like if she had had a few more days in the 5-day burst that she would have felt much better.   ? ?Thrush-Nystatin swish and swallow sent. ? ?She does seem to get yeast infections with antibiotics so sent over  Diflucan as well. ? ?Meds ordered this encounter  ?Medications  ? fluconazole (DIFLUCAN) 150 MG tablet  ?  Sig: Take 1 tablet (150 mg total) by mouth once for 1 dose.  ?  Dispense:  2 tablet  ?  Refill:  0  ? levofloxacin (LEVAQUIN) 500 MG tablet  ?  Sig: Take 1 tablet (500 mg total) by mouth daily for 7 days.  ?  Dispense:  28 tablet  ?  Refill:  0  ? predniSONE (DELTASONE) 20 MG tablet  ?  Sig: Take 2 tablets (40 mg total) by mouth daily with breakfast for 5 days, THEN 1 tablet (20 mg total) daily with breakfast for 5 days.  ?  Dispense:  15 tablet  ?  Refill:  0  ? nystatin (MYCOSTATIN) 100000 UNIT/ML suspension  ?  Sig: Take 5 mLs (500,000 Units total) by mouth 4  (four) times daily. X 7 days  ?  Dispense:  473 mL  ?  Refill:  0  ? ? ? ?I discussed the assessment and treatment plan with the patient. The patient was provided an opportunity to ask questions and all were answered. The patient agreed with the plan and demonstrated an understanding of the instructions. ?  ?The patient was advised to call back or seek an in-person evaluation if the symptoms worsen or if the condition fails to improve as anticipated. ? ? ?Nani Gasser, MD  ? ?

## 2022-03-07 ENCOUNTER — Telehealth: Payer: Self-pay

## 2022-03-07 MED ORDER — BENZONATATE 200 MG PO CAPS: 200.0000 mg | ORAL_CAPSULE | Freq: Three times a day (TID) | ORAL | 1 refills | Status: DC | PRN

## 2022-03-07 NOTE — Telephone Encounter (Signed)
Krystal Burgess called and states she needs those little pills for her cough. Pended a prescription.  ?

## 2022-03-07 NOTE — Telephone Encounter (Signed)
Meds ordered this encounter  ?Medications  ? benzonatate (TESSALON) 200 MG capsule  ?  Sig: Take 1 capsule (200 mg total) by mouth 3 (three) times daily as needed for cough.  ?  Dispense:  30 capsule  ?  Refill:  1  ? ? ?

## 2022-03-10 DIAGNOSIS — J449 Chronic obstructive pulmonary disease, unspecified: Secondary | ICD-10-CM | POA: Diagnosis not present

## 2022-03-10 DIAGNOSIS — J441 Chronic obstructive pulmonary disease with (acute) exacerbation: Secondary | ICD-10-CM | POA: Diagnosis not present

## 2022-03-29 ENCOUNTER — Other Ambulatory Visit: Payer: Self-pay | Admitting: Family Medicine

## 2022-03-29 DIAGNOSIS — J449 Chronic obstructive pulmonary disease, unspecified: Secondary | ICD-10-CM

## 2022-03-31 DIAGNOSIS — J449 Chronic obstructive pulmonary disease, unspecified: Secondary | ICD-10-CM | POA: Diagnosis not present

## 2022-04-02 DIAGNOSIS — J309 Allergic rhinitis, unspecified: Secondary | ICD-10-CM | POA: Diagnosis not present

## 2022-04-02 DIAGNOSIS — J3489 Other specified disorders of nose and nasal sinuses: Secondary | ICD-10-CM | POA: Diagnosis not present

## 2022-04-02 DIAGNOSIS — J342 Deviated nasal septum: Secondary | ICD-10-CM | POA: Diagnosis not present

## 2022-04-03 ENCOUNTER — Other Ambulatory Visit: Payer: Self-pay | Admitting: Family Medicine

## 2022-04-03 NOTE — Progress Notes (Signed)
Virtual Visit via Video Note ? ?I connected with Krystal Burgess on 04/04/22 at  9:30 AM EDT by a video enabled telemedicine application and verified that I am speaking with the correct person using two identifiers. ?  ?I discussed the limitations of evaluation and management by telemedicine and the availability of in person appointments. The patient expressed understanding and agreed to proceed. ? ?Patient location: home ?Provider locations: office ? ?Subjective:   ? ?CC: Sinusitis ? ?HPI: ?Pleasant 61 year old female presenting via MyChart video visit with complaints of recurrent sinusitis.  She has a long history of sinus issues and has been evaluated by ENT.  She was treated with 14 days of Levaquin approximately 1 month ago but reports she only took 7 days worth of this treatment.  She has the other 7 days at home.  Today, reports she has a bloody, green nasal discharge, burning in her sinuses, congestion, and shortness of breath on exertion.  Notes her oxygen saturations dropped to 72 a couple of days ago and so she is wearing her home nasal cannula around-the-clock rather than just with sleeping.  Approximately 1 month ago she also completed a burst dose of prednisone and is requesting more prednisone today.  Notes that ENT has ordered a scan for her but she has not gotten that scheduled yet.  Reports that her symptoms were present during her ENT evaluation however they did not prescribe antibiotics for her. ? ? ?Past medical history, Surgical history, Family history not pertinant except as noted below, Social history, Allergies, and medications have been entered into the medical record, reviewed, and corrections made.  ? ?Review of Systems: See HPI for pertinent positives and negatives.  ? ?Objective:   ? ?General: Speaking clearly in complete sentences without any shortness of breath.  Alert and oriented x3.  Normal judgment. No apparent acute distress. ? ?Impression and Recommendations:   ? ?1. Chronic  sinusitis, unspecified location ?Discussed recurrent use of antibiotics and the risks associated with frequent prednisone.  Since she only took 7 days of Levaquin, advised her to go ahead and take the second 7 days worth.  Also advised her to contact ENT regarding her symptoms to see if they can get her scheduled for her scan as soon as possible.  Recommend continuing to use regular nasal sprays and to incorporate saline nasal spray if she is using oxygen throughout the day as well as at night.  Do not think that prednisone is appropriate at this point but advised her that if her symptoms worsen or fail to improve with ENT recommendations and the second 7 days worth of Levaquin, she should reach back out. ? ?I discussed the assessment and treatment plan with the patient. The patient was provided an opportunity to ask questions and all were answered. The patient agreed with the plan and demonstrated an understanding of the instructions. ?  ?The patient was advised to call back or seek an in-person evaluation if the symptoms worsen or if the condition fails to improve as anticipated. ? ?25 minutes of non-face-to-face time was provided during this encounter. ? ?No follow-ups on file. ? ?Thayer Ohm, DNP, APRN, FNP-BC ?[facility] ?Primary Care and Sports Medicine ?

## 2022-04-04 ENCOUNTER — Encounter: Payer: Self-pay | Admitting: Medical-Surgical

## 2022-04-04 ENCOUNTER — Telehealth (INDEPENDENT_AMBULATORY_CARE_PROVIDER_SITE_OTHER): Payer: Medicare Other | Admitting: Medical-Surgical

## 2022-04-04 VITALS — Ht 60.0 in | Wt 247.0 lb

## 2022-04-04 DIAGNOSIS — J329 Chronic sinusitis, unspecified: Secondary | ICD-10-CM | POA: Diagnosis not present

## 2022-05-13 ENCOUNTER — Telehealth: Payer: Self-pay | Admitting: Family Medicine

## 2022-05-13 ENCOUNTER — Encounter: Payer: Self-pay | Admitting: Family Medicine

## 2022-05-13 ENCOUNTER — Ambulatory Visit: Payer: Medicare Other | Admitting: Family Medicine

## 2022-05-13 DIAGNOSIS — I10 Essential (primary) hypertension: Secondary | ICD-10-CM

## 2022-05-13 DIAGNOSIS — E039 Hypothyroidism, unspecified: Secondary | ICD-10-CM

## 2022-05-13 DIAGNOSIS — E119 Type 2 diabetes mellitus without complications: Secondary | ICD-10-CM

## 2022-05-13 NOTE — Telephone Encounter (Signed)
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 Patient became very upset on the phone and said since [name] does not want to talk to me about my new pains and concerns I will cancel this appt and find another doctor. So I canceled her appt for today.

## 2022-05-20 ENCOUNTER — Other Ambulatory Visit: Payer: Self-pay | Admitting: Family Medicine

## 2022-05-20 DIAGNOSIS — F3132 Bipolar disorder, current episode depressed, moderate: Secondary | ICD-10-CM

## 2022-05-20 NOTE — Telephone Encounter (Signed)
Please call pt to schedule appt.  No further refills until pt is seen.  T. Darrien Laakso, CMA  

## 2022-05-20 NOTE — Telephone Encounter (Signed)
Patient has an appt scheduled with Dr Linford Arnold on 06/02/22 & 06/17/22.

## 2022-05-22 ENCOUNTER — Encounter: Payer: Self-pay | Admitting: Family Medicine

## 2022-05-22 NOTE — Telephone Encounter (Signed)
Please call Dr. Alesia Richards office for last office visit note and any notes regarding her pain medications.  A smaller amount was filled last time so I just want to make sure that he was not taking her off or tapering her off.

## 2022-05-23 MED ORDER — OXYCODONE-ACETAMINOPHEN 10-325 MG PO TABS: 1.0000 | ORAL_TABLET | Freq: Three times a day (TID) | ORAL | 0 refills | Status: DC | PRN

## 2022-05-23 NOTE — Telephone Encounter (Signed)
Spoke with Krystal Burgess at Dr. Alesia Richards office and she stated that pt has not had a follow up as instructed.  Therefore, Dr. Estella Husk will not refill a full prescription of the oxycodone, but he did not want her to stop it abruptly so he supplied a tapering dose until she had a follow up with him.  Tiajuana Amass, CMA

## 2022-05-26 ENCOUNTER — Telehealth: Payer: Self-pay

## 2022-05-26 MED ORDER — OXYCODONE HCL 10 MG PO TABS: 10.0000 mg | ORAL_TABLET | Freq: Three times a day (TID) | ORAL | 0 refills | Status: DC

## 2022-05-26 NOTE — Telephone Encounter (Signed)
Pt called stating that [facility] in [place] does not have oxycodone-APAP 5mg -325mg  in stock.  Verified this with pharmacy and that the pt did not have RX sent on 05/23/2022 filled.  They do have oxycodone 10mg  without APAP.  Please advise.  , CMA

## 2022-05-26 NOTE — Telephone Encounter (Signed)
Pt informed.  T. Stefen Juba, CMA 

## 2022-05-26 NOTE — Telephone Encounter (Signed)
Meds ordered this encounter  Medications   Oxycodone HCl 10 MG TABS    Sig: Take 1 tablet (10 mg total) by mouth every 8 (eight) hours.    Dispense:  90 tablet    Refill:  0    

## 2022-05-28 ENCOUNTER — Other Ambulatory Visit: Payer: Self-pay | Admitting: Family Medicine

## 2022-05-28 DIAGNOSIS — E039 Hypothyroidism, unspecified: Secondary | ICD-10-CM

## 2022-05-29 ENCOUNTER — Ambulatory Visit: Payer: Medicare Other | Admitting: Family Medicine

## 2022-06-01 ENCOUNTER — Other Ambulatory Visit: Payer: Self-pay | Admitting: Family Medicine

## 2022-06-02 ENCOUNTER — Encounter: Payer: Self-pay | Admitting: Family Medicine

## 2022-06-02 ENCOUNTER — Telehealth (INDEPENDENT_AMBULATORY_CARE_PROVIDER_SITE_OTHER): Payer: Medicare Other | Admitting: Family Medicine

## 2022-06-02 VITALS — Resp 18 | Ht 60.0 in | Wt 265.0 lb

## 2022-06-02 DIAGNOSIS — F3132 Bipolar disorder, current episode depressed, moderate: Secondary | ICD-10-CM

## 2022-06-02 DIAGNOSIS — M545 Low back pain, unspecified: Secondary | ICD-10-CM | POA: Diagnosis not present

## 2022-06-02 DIAGNOSIS — G8929 Other chronic pain: Secondary | ICD-10-CM

## 2022-06-02 DIAGNOSIS — G629 Polyneuropathy, unspecified: Secondary | ICD-10-CM

## 2022-06-02 DIAGNOSIS — G2581 Restless legs syndrome: Secondary | ICD-10-CM | POA: Diagnosis not present

## 2022-06-02 DIAGNOSIS — M48061 Spinal stenosis, lumbar region without neurogenic claudication: Secondary | ICD-10-CM

## 2022-06-02 DIAGNOSIS — F5104 Psychophysiologic insomnia: Secondary | ICD-10-CM

## 2022-06-02 MED ORDER — OXYCODONE HCL 10 MG PO TABS: 10.0000 mg | ORAL_TABLET | Freq: Three times a day (TID) | ORAL | 0 refills | Status: DC

## 2022-06-02 MED ORDER — GABAPENTIN 600 MG PO TABS: 600.0000 mg | ORAL_TABLET | Freq: Two times a day (BID) | ORAL | 1 refills | Status: DC

## 2022-06-02 MED ORDER — TRAZODONE HCL 100 MG PO TABS: 100.0000 mg | ORAL_TABLET | Freq: Every day | ORAL | 0 refills | Status: DC

## 2022-06-02 MED ORDER — ALPRAZOLAM 1 MG PO TABS: 1.0000 mg | ORAL_TABLET | Freq: Three times a day (TID) | ORAL | 1 refills | Status: DC

## 2022-06-02 NOTE — Progress Notes (Signed)
Virtual Visit via Video Note  I connected with Krystal Burgess on 06/02/22 at  1:00 PM EDT by a video enabled telemedicine application and verified that I am speaking with the correct person using two identifiers.   I discussed the limitations of evaluation and management by telemedicine and the availability of in person appointments. The patient expressed understanding and agreed to proceed.  Patient location: at home Provider location: in office  Subjective:    CC:   Chief Complaint  Patient presents with   Referral    Patient would like a referral to a different Neurologist. Patient currently [facility]. Patient requesting for a refill on Xanax, Gabapentin and Trazodone until she is able to see a different Neurologist     HPI: Neuropathy  -she has been followed by neurology for some time.  She just feels like her neurologist has not been as concerned and friendly as he has in the past.  She has been followed by Dr. Jettie Booze time for cervical radiculopathy, chronic sciatica and carpal tunnel syndrome.  She would like to have a consultation with another neurologist.  He had been writing her pain medications and would like me to fill those temporarily as she plans on not going back to see him and would like a new consultation.  Is also been concerned because she has been having a lot of pain in her joints particularly in her hands she also has chronic back pain.  Past medical history, Surgical history, Family history not pertinant except as noted below, Social history, Allergies, and medications have been entered into the medical record, reviewed, and corrections made.    Objective:    General: Speaking clearly in complete sentences without any shortness of breath.  Alert and oriented x3.  Normal judgment. No apparent acute distress.    Impression and Recommendations:    Problem List Items Addressed This Visit       Nervous and Auditory   Peripheral neuropathy    Relevant Medications   ALPRAZolam (XANAX) 1 MG tablet   gabapentin (NEURONTIN) 600 MG tablet   traZODone (DESYREL) 100 MG tablet   Other Relevant Orders   Ambulatory referral to Neurology   Ambulatory referral to Pain Clinic     Other   Spinal stenosis of lumbar region    History of prior lumbar laminectomy.  I did go ahead and refill her gabapentin today as well as her pain medications to transition her while she looks for a new provider.  I am going to go ahead and place referral for pain management I think that would better suit her needs instead of just neurology.  I did go ahead and send over the refill.  If we have to delay in getting her and then she will need to sign a pain contract and do a urine drug screen and she is aware.      RLS (restless legs syndrome) - Primary   Relevant Medications   Oxycodone HCl 10 MG TABS (Start on 06/24/2022)   Chronic insomnia    She is wanting to consider to switch to [facility].  Se often uses her xanax at night and I told her she would have to give that up.  She also uses trazodone.  She wants to think about it and let me know        Chronic back pain   Relevant Medications   gabapentin (NEURONTIN) 600 MG tablet   Oxycodone HCl 10 MG TABS (Start on  06/24/2022)   traZODone (DESYREL) 100 MG tablet   Other Relevant Orders   Ambulatory referral to Pain Clinic   Bipolar disorder (HCC)   Relevant Medications   traZODone (DESYREL) 100 MG tablet    Orders Placed This Encounter  Procedures   Ambulatory referral to Neurology    Referral Priority:   Routine    Referral Type:   Consultation    Referral Reason:   Second Opinion    Requested Specialty:   Neurology    Number of Visits Requested:   1   Ambulatory referral to Pain Clinic    Referral Priority:   Routine    Referral Type:   Consultation    Referral Reason:   Specialty Services Required    Requested Specialty:   Pain Medicine    Number of Visits Requested:   1    Meds ordered this  encounter  Medications   ALPRAZolam (XANAX) 1 MG tablet    Sig: Take 1 tablet (1 mg total) by mouth in the morning, at noon, and at bedtime.    Dispense:  60 tablet    Refill:  1   gabapentin (NEURONTIN) 600 MG tablet    Sig: Take 1 tablet (600 mg total) by mouth 2 (two) times daily. Patient takes 1200 mg daily    Dispense:  180 tablet    Refill:  1   Oxycodone HCl 10 MG TABS    Sig: Take 1 tablet (10 mg total) by mouth every 8 (eight) hours.    Dispense:  90 tablet    Refill:  0   traZODone (DESYREL) 100 MG tablet    Sig: Take 1 tablet (100 mg total) by mouth at bedtime.    Dispense:  90 tablet    Refill:  0     I discussed the assessment and treatment plan with the patient. The patient was provided an opportunity to ask questions and all were answered. The patient agreed with the plan and demonstrated an understanding of the instructions.   The patient was advised to call back or seek an in-person evaluation if the symptoms worsen or if the condition fails to improve as anticipated.  I spent 20 minutes on the day of the encounter to include pre-visit record review, face-to-face time with the patient and post visit ordering of test.   Nani Gasser, MD

## 2022-06-02 NOTE — Assessment & Plan Note (Signed)
History of prior lumbar laminectomy.  I did go ahead and refill her gabapentin today as well as her pain medications to transition her while she looks for a new provider.  I am going to go ahead and place referral for pain management I think that would better suit her needs instead of just neurology.  I did go ahead and send over the refill.  If we have to delay in getting her and then she will need to sign a pain contract and do a urine drug screen and she is aware.

## 2022-06-02 NOTE — Assessment & Plan Note (Signed)
She is wanting to consider to switch to [facility].  Se often uses her xanax at night and I told her she would have to give that up.  She also uses trazodone.  She wants to think about it and let me know

## 2022-06-13 ENCOUNTER — Encounter: Payer: Self-pay | Admitting: Family Medicine

## 2022-06-17 ENCOUNTER — Telehealth (INDEPENDENT_AMBULATORY_CARE_PROVIDER_SITE_OTHER): Payer: Medicare Other | Admitting: Family Medicine

## 2022-06-17 ENCOUNTER — Encounter: Payer: Self-pay | Admitting: Family Medicine

## 2022-06-17 VITALS — Ht 60.0 in | Wt 248.0 lb

## 2022-06-17 DIAGNOSIS — F3132 Bipolar disorder, current episode depressed, moderate: Secondary | ICD-10-CM

## 2022-06-17 DIAGNOSIS — M79601 Pain in right arm: Secondary | ICD-10-CM | POA: Diagnosis not present

## 2022-06-17 DIAGNOSIS — J329 Chronic sinusitis, unspecified: Secondary | ICD-10-CM

## 2022-06-17 NOTE — Progress Notes (Addendum)
Virtual Visit via Video Note  I connected with Krystal Burgess on 06/17/22 at  2:20 PM EDT by a video enabled telemedicine application and verified that I am speaking with the correct person using two identifiers.   I discussed the limitations of evaluation and management by telemedicine and the availability of in person appointments. The patient expressed understanding and agreed to proceed.  Patient location: at home Provider location: in office  Subjective:    CC:   Chief Complaint  Patient presents with   Nasal congestion    Patient complains of nasal congestion, sore throat, wheezing and slight productive cough for 2-3 days.     Complains of nasal congestion sore throat with intermittently productive cough for several days.  She says the congestion and pressure is worse when she lays down.  She is also had a runny nose.  No fever.  Last time she was treated for sinusitis was in April.  Feels like the phlegm is so thick that it sticking in the back of her throat.  She is also been having some right arm pain from the shoulder down to her hand with some intermittent numbness in the hand.  She has had a prior history of carpal tunnel syndrome and has the wrist braces but says she is not wearing them because they are more bothersome when she does wear them.  She wonders if some of the pain could also be coming from her neck.  She would like a refill on her natural Atrovent nasal spray she does feel like it is helpful.  She says she really feels like it is more helpful than the nasal steroid spray.  When I last saw her about 2 weeks ago we placed referral to pain management and neurology she said she has not heard from either office.  At her last follow-up with ENT they had recommended that she get a CT facial scan.  But she says every time she schedules that she gets sick and cannot go.  She has a significant hole in her sinuses.  She is also originally scheduled for diabetes  follow-up but because she was sick she wanted to turn into a virtual visit.  She is already scheduled her next appointment in July.  HPI:    Past medical history, Surgical history, Family history not pertinant except as noted below, Social history, Allergies, and medications have been entered into the medical record, reviewed, and corrections made.    Objective:    General: Speaking clearly in complete sentences without any shortness of breath.  Alert and oriented x3.  Normal judgment. No apparent acute distress.    Impression and Recommendations:    Problem List Items Addressed This Visit       Other   Bipolar disorder [facility])    Would like updated referral to psychiatry for medication management.      Relevant Orders   Ambulatory referral to Psychiatry   Other Visit Diagnoses     Chronic sinusitis, unspecified location    -  Primary   Right arm pain           Orders Placed This Encounter  Procedures   Ambulatory referral to Psychiatry    Referral Priority:   Routine    Referral Type:   Psychiatric    Referral Reason:   Specialty Services Required    Requested Specialty:   Psychiatry    Number of Visits Requested:   1    No orders  of the defined types were placed in this encounter.   Acute on chronic sinusitis-we will go ahead and treat with Omnicef.  I did send over prescription for prednisone that she has had some intermittent wheezing as well but did encourage her to hold off for a couple of days unless she feels like she really needs it.  We discussed once again her frequent use of antibiotics and increased risk for C. difficile as well as her recurrent use of prednisone which can cause significant adrenal dysfunction and other side effects.  Strongly encouraged her to get back in with ENT and get the scan done when she is able to.  Right arm pain-we discussed that it could be coming from a couple of different causes including carpal tunnel, neuropathy in the  elbow or even from her cervical spine.  Encouraged her to consider consultation with an orthopedist if its not improving with heat, ice, and gentle stretches.  Chronic rhinitis-she should still have some refills on file for the Atrovent nasal spray it was filled for a year in February.  Regards to referrals it looks like neurology has attempted to call her multiple times.  Her daughter was on the call as well in the background and recommended that they try to call her phone.  Her number is [phone].  I discussed the assessment and treatment plan with the patient. The patient was provided an opportunity to ask questions and all were answered. The patient agreed with the plan and demonstrated an understanding of the instructions.   The patient was advised to call back or seek an in-person evaluation if the symptoms worsen or if the condition fails to improve as anticipated.  I spent 22 minutes on the day of the encounter to include pre-visit record review, face-to-face time with the patient and post visit ordering of test.   Nani Gasser, MD

## 2022-06-20 ENCOUNTER — Encounter: Payer: Self-pay | Admitting: Family Medicine

## 2022-06-20 MED ORDER — PREDNISONE 20 MG PO TABS: 40.0000 mg | ORAL_TABLET | Freq: Every day | ORAL | 0 refills | Status: DC

## 2022-06-20 MED ORDER — CEFDINIR 300 MG PO CAPS: 300.0000 mg | ORAL_CAPSULE | Freq: Two times a day (BID) | ORAL | 0 refills | Status: DC

## 2022-06-20 NOTE — Addendum Note (Signed)
Addended by: Nani Gasser D on: 06/20/2022 06:00 PM   Modules accepted: Orders

## 2022-06-26 ENCOUNTER — Encounter: Payer: Self-pay | Admitting: Family Medicine

## 2022-06-26 ENCOUNTER — Other Ambulatory Visit: Payer: Self-pay | Admitting: Family Medicine

## 2022-06-26 DIAGNOSIS — J449 Chronic obstructive pulmonary disease, unspecified: Secondary | ICD-10-CM

## 2022-06-27 MED ORDER — IPRATROPIUM BROMIDE 0.02 % IN SOLN: 0.5000 mg | Freq: Three times a day (TID) | RESPIRATORY_TRACT | 12 refills | Status: DC | PRN

## 2022-06-27 MED ORDER — ALBUTEROL SULFATE 1.25 MG/3ML IN NEBU: 1.0000 | INHALATION_SOLUTION | Freq: Three times a day (TID) | RESPIRATORY_TRACT | 12 refills | Status: DC | PRN

## 2022-06-27 NOTE — Telephone Encounter (Signed)
I spoke with her daughter and she states the 600 mg twice daily is fine. I gave her the number to [facility] to call and schedule an appointment.

## 2022-06-27 NOTE — Telephone Encounter (Signed)
I am assuming she is talking about the DuoNeb.  Which is the ipratropium/albuterol combination.  Do make an inhaler version called Combivent but it is not a nebulizer that would be one option.  Or we can try to write for separate albuterol and separate ipratropium neb solutions.  Either way just let me know.

## 2022-06-27 NOTE — Telephone Encounter (Signed)
Meds ordered this encounter  Medications   ipratropium (ATROVENT) 0.02 % nebulizer solution    Sig: Take 2.5 mLs (0.5 mg total) by nebulization 3 (three) times daily as needed for wheezing or shortness of breath.    Dispense:  75 mL    Refill:  12   albuterol (ACCUNEB) 1.25 MG/3ML nebulizer solution    Sig: Take 3 mLs (1.25 mg total) by nebulization 3 (three) times daily as needed for wheezing.    Dispense:  75 mL    Refill:  12

## 2022-06-27 NOTE — Telephone Encounter (Signed)
Can we check on referral. I agreed to cover her meds while trying to get her in but I really do not feel comfortable making a lot of adjustments.

## 2022-07-15 ENCOUNTER — Telehealth (INDEPENDENT_AMBULATORY_CARE_PROVIDER_SITE_OTHER): Payer: Medicare Other | Admitting: Family Medicine

## 2022-07-15 ENCOUNTER — Telehealth: Payer: Self-pay | Admitting: Family Medicine

## 2022-07-15 DIAGNOSIS — M48061 Spinal stenosis, lumbar region without neurogenic claudication: Secondary | ICD-10-CM

## 2022-07-15 DIAGNOSIS — Z1231 Encounter for screening mammogram for malignant neoplasm of breast: Secondary | ICD-10-CM

## 2022-07-15 DIAGNOSIS — G629 Polyneuropathy, unspecified: Secondary | ICD-10-CM

## 2022-07-15 DIAGNOSIS — J3089 Other allergic rhinitis: Secondary | ICD-10-CM

## 2022-07-15 DIAGNOSIS — J301 Allergic rhinitis due to pollen: Secondary | ICD-10-CM

## 2022-07-15 MED ORDER — PREDNISONE 20 MG PO TABS: 20.0000 mg | ORAL_TABLET | Freq: Every day | ORAL | 0 refills | Status: DC

## 2022-07-15 MED ORDER — OLOPATADINE HCL 0.2 % OP SOLN: 1.0000 [drp] | Freq: Every day | OPHTHALMIC | 0 refills | Status: DC

## 2022-07-15 NOTE — Progress Notes (Signed)
Virtual Visit via Video Note  I connected with Krystal Burgess on 07/15/22 at  2:20 PM EDT by a video enabled telemedicine application and verified that I am speaking with the correct person using two identifiers.   I discussed the limitations of evaluation and management by telemedicine and the availability of in person appointments. The patient expressed understanding and agreed to proceed.  Patient location: at home Provider location: in office  Subjective:    CC:   Chief Complaint  Patient presents with   Sinusitis    HPI:  Pt reports that her sinuses got worse over the weekend. Her eyes are red,itchy and runny, her grass was mowed on Saturday and that's when her allergies got worse and her sxs flared. Her eyes are very inflamed and waking up with crusting. She stated that she has been taking nyquil. She has pain and pressure between her eyes. Denies any f/s/chills.    Asked pt about if she had her eyes examined she stated that she has an appt with Dr. Lorenso Courier 08/08/22.   Order for mammogram placed.    Patient stated that the referral for the neurologist that was placed doesn't do nerve blocks. She was told that she will need to get a referral for a neurosurgeon     Past medical history, Surgical history, Family history not pertinant except as noted below, Social history, Allergies, and medications have been entered into the medical record, reviewed, and corrections made.    Objective:    General: Speaking clearly in complete sentences without any shortness of breath.  Alert and oriented x3.  Normal judgment. No apparent acute distress.    Impression and Recommendations:    Problem List Items Addressed This Visit       Nervous and Auditory   Peripheral neuropathy    She can't find her gabapentin. It was filled for 90 days supply in June and the pharmacy says she has picked it up but she can't find it. She has been without her gaba for at least 3 weeks.  She  would like to see if can get a new rx. We can call the pharmacy and see if they can refill the medication early.          Other   Spinal stenosis of lumbar region   Other Visit Diagnoses     Screening mammogram for breast cancer    -  Primary   Relevant Orders   MM 3D SCREEN BREAST BILATERAL   Seasonal allergic rhinitis due to other allergic trigger       Relevant Medications   predniSONE (DELTASONE) 20 MG tablet   Allergic rhinitis due to pollen, unspecified seasonality       Relevant Medications   Olopatadine HCl 0.2 % SOLN       Allergic rhinitis-we discussed options including a round of low-dose prednisone since she is already taking an antihistamine and using nasal sprays as well as adding Pataday drops.  If she is not improving or develops worsening symptoms including increased nasal discharge cough or shortness of breath then please let me know.  We will reach out to our scheduler to get her the phone number for pain management.  So that way she can call them herself we discussed that they are the ones that would do things like injections and chronic pain meds for her.  Orders Placed This Encounter  Procedures   MM 3D SCREEN BREAST BILATERAL    Scheduling Instructions:  Please call pt to schedule.    Order Specific Question:   Reason for Exam (SYMPTOM  OR DIAGNOSIS REQUIRED)    Answer:   screening mammogram    Order Specific Question:   Is the patient pregnant?    Answer:   No    Order Specific Question:   Preferred imaging location?    Answer:   [facility]    Meds ordered this encounter  Medications   predniSONE (DELTASONE) 20 MG tablet    Sig: Take 1 tablet (20 mg total) by mouth daily with breakfast.    Dispense:  7 tablet    Refill:  0   Olopatadine HCl 0.2 % SOLN    Sig: Apply 1 drop to eye daily. To each eye    Dispense:  2.5 mL    Refill:  0     I discussed the assessment and treatment plan with the patient. The patient was provided an  opportunity to ask questions and all were answered. The patient agreed with the plan and demonstrated an understanding of the instructions.   The patient was advised to call back or seek an in-person evaluation if the symptoms worsen or if the condition fails to improve as anticipated.   Nani Gasser, MD

## 2022-07-15 NOTE — Telephone Encounter (Signed)
Please call the pharmacy and see if can fill the gabapentin early.  Patient says she lost her prescription.

## 2022-07-15 NOTE — Progress Notes (Signed)
Pt reports that her sinuses got worse over the weekend. Her eyes are red,itchy and runny, her grass was mowed on Saturday and that's when her allergies got worse and her sxs flared.   She stated that she has been taking nyquil. She has pain and pressure between her eyes.   Denies any f/s/chills.   Asked pt about if she had her eyes examined she stated that she has an appt with Dr. Lorenso Courier 08/08/22.  Order for mammogram placed.   Patient stated that the referral for the neurologist that was placed doesn't do nerve blocks. She was told that she will need to get a referral for a neurosurgeon

## 2022-07-15 NOTE — Assessment & Plan Note (Signed)
She can't find her gabapentin. It was filled for 90 days supply in June and the pharmacy says she has picked it up but she can't find it. She has been without her gaba for at least 3 weeks.  She would like to see if can get a new rx. We can call the pharmacy and see if they can refill the medication early.

## 2022-07-16 NOTE — Telephone Encounter (Signed)
Called pharmacy and spoke w/Kim. She stated that the prescription is on file however they will need to order this and can have this ready for her tomorrow.

## 2022-07-20 ENCOUNTER — Other Ambulatory Visit: Payer: Self-pay | Admitting: Family Medicine

## 2022-07-27 ENCOUNTER — Other Ambulatory Visit: Payer: Self-pay | Admitting: Family Medicine

## 2022-07-27 DIAGNOSIS — J449 Chronic obstructive pulmonary disease, unspecified: Secondary | ICD-10-CM

## 2022-07-29 ENCOUNTER — Encounter: Payer: Self-pay | Admitting: Family Medicine

## 2022-07-29 DIAGNOSIS — G2581 Restless legs syndrome: Secondary | ICD-10-CM

## 2022-07-30 MED ORDER — OXYCODONE HCL 10 MG PO TABS: 10.0000 mg | ORAL_TABLET | Freq: Three times a day (TID) | ORAL | 0 refills | Status: DC

## 2022-07-30 MED ORDER — ALPRAZOLAM 1 MG PO TABS
1.0000 mg | ORAL_TABLET | Freq: Two times a day (BID) | ORAL | 0 refills | Status: DC | PRN
Start: 2022-07-30 — End: 2022-08-28

## 2022-07-30 NOTE — Telephone Encounter (Signed)
Where does she need the new referral?    Meds ordered this encounter  Medications   Oxycodone HCl 10 MG TABS    Sig: Take 1 tablet (10 mg total) by mouth every 8 (eight) hours.    Dispense:  90 tablet    Refill:  0   ALPRAZolam (XANAX) 1 MG tablet    Sig: Take 1 tablet (1 mg total) by mouth 2 (two) times daily as needed for anxiety.    Dispense:  60 tablet    Refill:  0

## 2022-08-04 NOTE — Telephone Encounter (Signed)
Referral entered.  Tiajuana Amass, CMA

## 2022-08-06 ENCOUNTER — Telehealth: Payer: Self-pay | Admitting: Family Medicine

## 2022-08-06 NOTE — Telephone Encounter (Signed)
,   Are they going to send some, note?  Or should we try different pain management?  What you think?  I am definitely more than happy to decrease her alprazolam.  It really needs to be done anyway because she cannot be on chronic narcotics and long-term benzos.

## 2022-08-06 NOTE — Telephone Encounter (Signed)
[name]   [facility] called and stated that Dr. Oneal Grout is not willing to take over care of of Krystal Burgess's Pain management until her Sedating medications are decreased. Please advise.   Arline Asp

## 2022-08-08 NOTE — Telephone Encounter (Signed)
I say like try elsewhere.  She live in [place] if that helo[ps.  IN the meantime I can start working with her on adjusting meds but that takes time and I don't want to wait another 3 months to get her in somewhere

## 2022-08-18 ENCOUNTER — Ambulatory Visit (INDEPENDENT_AMBULATORY_CARE_PROVIDER_SITE_OTHER): Payer: Medicare Other | Admitting: Family Medicine

## 2022-08-18 DIAGNOSIS — Z87891 Personal history of nicotine dependence: Secondary | ICD-10-CM | POA: Diagnosis not present

## 2022-08-18 DIAGNOSIS — Z0000 Encounter for general adult medical examination without abnormal findings: Secondary | ICD-10-CM

## 2022-08-18 DIAGNOSIS — Z122 Encounter for screening for malignant neoplasm of respiratory organs: Secondary | ICD-10-CM | POA: Diagnosis not present

## 2022-08-18 NOTE — Patient Instructions (Addendum)
MEDICARE ANNUAL WELLNESS VISIT Health Maintenance Summary and Written Plan of Care  Ms. Dorough ,  Thank you for allowing me to perform your Medicare Annual Wellness Visit and for your ongoing commitment to your health.   Health Maintenance & Immunization History Health Maintenance  Topic Date Due   PAP SMEAR-Modifier  08/18/2022 (Originally 12/22/2010)   OPHTHALMOLOGY EXAM  08/18/2022 (Originally 06/01/2020)   HEMOGLOBIN A1C  08/19/2022 (Originally 05/29/2022)   Zoster Vaccines- Shingrix (1 of 2) 11/18/2022 (Originally 03/01/1982)   INFLUENZA VACCINE  03/22/2023 (Originally 07/22/2022)   MAMMOGRAM  08/19/2023 (Originally 04/25/2019)   COVID-19 Vaccine (1) 01/18/2024 (Originally 03/01/1966)   Diabetic kidney evaluation - GFR measurement  11/28/2022   Diabetic kidney evaluation - Urine ACR  11/28/2022   FOOT EXAM  11/28/2022   COLONOSCOPY (Pts 45-66yrs Insurance coverage will need to be confirmed)  10/19/2024   TETANUS/TDAP  11/06/2027   Hepatitis C Screening  Completed   HIV Screening  Completed   HPV VACCINES  Aged Out   Pneumococcal Vaccine 40-80 Years old  Discontinued   Immunization History  Administered Date(s) Administered   Influenza Split 09/07/2012   Influenza Whole 08/26/2011   Influenza,inj,Quad PF,6+ Mos 08/31/2013, 09/07/2014, 11/01/2015, 08/14/2016, 09/17/2017, 08/17/2018, 10/03/2019, 11/28/2021   Influenza,inj,quad, With Preservative 03/21/2016   Influenza-Unspecified 03/21/2016   Pneumococcal Polysaccharide-23 10/14/2011   Tdap 11/05/2017    These are the patient goals that we discussed:  Goals Addressed               This Visit's Progress     Patient Stated (pt-stated)        Patient would like to be able to walk around and do ADLs by herself.         This is a list of Health Maintenance Items that are overdue or due now: Influenza vaccine Screening mammography Screening Pap smear and pelvic exam  Shingrix vaccine Eye exam Hemoglobin  A1C  Orders/Referrals Placed Today: Orders Placed This Encounter  Procedures   Ambulatory Referral for Lung Cancer Scre    Referral Priority:   Routine    Referral Type:   Consultation    Referral Reason:   Specialty Services Required    Number of Visits Requested:   1   (Contact our referral department at [phone] if you have not spoken with someone about your referral appointment within the next 5 days)    Follow-up Plan Follow-up with Agapito Games, MD as planned Schedule your Shingrix vaccine at your pharmacy.  Please schedule your eye exam. Medicare wellness visit in one year. Patient will access AVS on my chart.     Health Maintenance, Female Adopting a healthy lifestyle and getting preventive care are important in promoting health and wellness. Ask your health care provider about: The right schedule for you to have regular tests and exams. Things you can do on your own to prevent diseases and keep yourself healthy. What should I know about diet, weight, and exercise? Eat a healthy diet  Eat a diet that includes plenty of vegetables, fruits, low-fat dairy products, and lean protein. Do not eat a lot of foods that are high in solid fats, added sugars, or sodium. Maintain a healthy weight Body mass index (BMI) is used to identify weight problems. It estimates body fat based on height and weight. Your health care provider can help determine your BMI and help you achieve or maintain a healthy weight. Get regular exercise Get regular exercise. This is one of the most  important things you can do for your health. Most adults should: Exercise for at least 150 minutes each week. The exercise should increase your heart rate and make you sweat (moderate-intensity exercise). Do strengthening exercises at least twice a week. This is in addition to the moderate-intensity exercise. Spend less time sitting. Even light physical activity can be beneficial. Watch cholesterol and  blood lipids Have your blood tested for lipids and cholesterol at 59 years of age, then have this test every 5 years. Have your cholesterol levels checked more often if: Your lipid or cholesterol levels are high. You are older than 59 years of age. You are at high risk for heart disease. What should I know about cancer screening? Depending on your health history and family history, you may need to have cancer screening at various ages. This may include screening for: Breast cancer. Cervical cancer. Colorectal cancer. Skin cancer. Lung cancer. What should I know about heart disease, diabetes, and high blood pressure? Blood pressure and heart disease High blood pressure causes heart disease and increases the risk of stroke. This is more likely to develop in people who have high blood pressure readings or are overweight. Have your blood pressure checked: Every 3-5 years if you are 28-45 years of age. Every year if you are 85 years old or older. Diabetes Have regular diabetes screenings. This checks your fasting blood sugar level. Have the screening done: Once every three years after age 42 if you are at a normal weight and have a low risk for diabetes. More often and at a younger age if you are overweight or have a high risk for diabetes. What should I know about preventing infection? Hepatitis B If you have a higher risk for hepatitis B, you should be screened for this virus. Talk with your health care provider to find out if you are at risk for hepatitis B infection. Hepatitis C Testing is recommended for: Everyone born from [birth year] through 1963. Anyone with known risk factors for hepatitis C. Sexually transmitted infections (STIs) Get screened for STIs, including gonorrhea and chlamydia, if: You are sexually active and are younger than 59 years of age. You are older than 59 years of age and your health care provider tells you that you are at risk for this type of infection. Your sexual  activity has changed since you were last screened, and you are at increased risk for chlamydia or gonorrhea. Ask your health care provider if you are at risk. Ask your health care provider about whether you are at high risk for HIV. Your health care provider may recommend a prescription medicine to help prevent HIV infection. If you choose to take medicine to prevent HIV, you should first get tested for HIV. You should then be tested every 3 months for as long as you are taking the medicine. Pregnancy If you are about to stop having your period (premenopausal) and you may become pregnant, seek counseling before you get pregnant. Take 400 to 800 micrograms (mcg) of folic acid every day if you become pregnant. Ask for birth control (contraception) if you want to prevent pregnancy. Osteoporosis and menopause Osteoporosis is a disease in which the bones lose minerals and strength with aging. This can result in bone fractures. If you are 5 years old or older, or if you are at risk for osteoporosis and fractures, ask your health care provider if you should: Be screened for bone loss. Take a calcium or vitamin D supplement to lower your risk  of fractures. Be given hormone replacement therapy (HRT) to treat symptoms of menopause. Follow these instructions at home: Alcohol use Do not drink alcohol if: Your health care provider tells you not to drink. You are pregnant, may be pregnant, or are planning to become pregnant. If you drink alcohol: Limit how much you have to: 0-1 drink a day. Know how much alcohol is in your drink. In the [place] one drink equals one 12 oz bottle of beer (355 mL), one 5 oz glass of wine (148 mL), or one 1 oz glass of hard liquor (44 mL). Lifestyle Do not use any products that contain nicotine or tobacco. These products include cigarettes, chewing tobacco, and vaping devices, such as e-cigarettes. If you need help quitting, ask your health care provider. Do not use street  drugs. Do not share needles. Ask your health care provider for help if you need support or information about quitting drugs. General instructions Schedule regular health, dental, and eye exams. Stay current with your vaccines. Tell your health care provider if: You often feel depressed. You have ever been abused or do not feel safe at home. Summary Adopting a healthy lifestyle and getting preventive care are important in promoting health and wellness. Follow your health care provider's instructions about healthy diet, exercising, and getting tested or screened for diseases. Follow your health care provider's instructions on monitoring your cholesterol and blood pressure. This information is not intended to replace advice given to you by your health care provider. Make sure you discuss any questions you have with your health care provider. Document Revised: 04/29/2021 Document Reviewed: 04/29/2021 Elsevier Patient Education  2023 [employer].

## 2022-08-18 NOTE — Progress Notes (Signed)
MEDICARE ANNUAL WELLNESS VISIT  08/18/2022  Telephone Visit Disclaimer This Medicare AWV was conducted by telephone due to national recommendations for restrictions regarding the COVID-19 Pandemic (e.g. social distancing).  I verified, using two identifiers, that I am speaking with Krystal Burgess or their authorized healthcare agent. I discussed the limitations, risks, security, and privacy concerns of performing an evaluation and management service by telephone and the potential availability of an in-person appointment in the future. The patient expressed understanding and agreed to proceed.  Location of Patient: Home Location of Provider (nurse): In the office.  Subjective:    Krystal Burgess is a 61 y.o. female patient of Metheney, Barbarann Ehlers, MD who had a Medicare Annual Wellness Visit today via telephone. Krystal Burgess is Disabled and lives with their daughter. she has 1 child. she reports that she is socially active and does interact with friends/family regularly. she is minimally physically active and enjoys playing games on her computer.  Patient Care Team: Agapito Games, MD as PCP - Leana Roe, MD as Referring Physician (Pulmonary Disease) Dr. Cain Saupe (Ophthalmology)     08/18/2022    2:47 PM 05/13/2021    9:10 PM 03/02/2021    7:42 PM 03/02/2021   12:17 PM  Advanced Directives  Does Patient Have a Medical Advance Directive? No No No No  Would patient like information on creating a medical advance directive? No - Patient declined No - Patient declined No - Patient declined     Hospital Utilization Over the Past 12 Months: # of hospitalizations or ER visits: 0 # of surgeries: 0  Review of Systems    Patient reports that her overall health is worse compared to last year.  History obtained from chart review and the patient  Patient Reported Readings (BP, Pulse, CBG, Weight, etc) none  Pain Assessment Pain : 0-10 Pain Score: 8  Pain Type:  Chronic pain Pain Location: Generalized Pain Descriptors / Indicators: Dull Pain Onset: More than a month ago Pain Frequency: Constant Pain Relieving Factors: medication  Pain Relieving Factors: medication  Current Medications & Allergies (verified) Allergies as of 08/18/2022       Reactions   Risperidone And Related Shortness Of Breath   Aripiprazole Other (See Comments)   REACTION: muscle jerks   Fluoxetine Hcl Other (See Comments)   REACTION: Intolerant   Nsaids Other (See Comments)   Upper GI bleed   Paroxetine Other (See Comments)   REACTION: Intolerance   Ziprasidone Hcl Other (See Comments)   shakes   Chantix [varenicline Tartrate] Other (See Comments)   "messing with my mood"   Metformin And Related Nausea Only   Montelukast Other (See Comments)   "it kept me sick with an upper respiratory infection"   Onglyza [saxagliptin] Nausea Only   Augmentin [amoxicillin-pot Clavulanate] Other (See Comments)   Gets yeast infection every time   Fluoxetine Hcl    REACTION: Intolerant        Medication List        Accurate as of August 18, 2022  2:54 PM. If you have any questions, ask your nurse or doctor.          albuterol 108 (90 Base) MCG/ACT inhaler Commonly known as: VENTOLIN HFA INHALE 2 PUFFS INTO THE LUNGS EVERY 4 HOURS AS NEEDED FOR WHEEZING OR SHORTNESS OF BREATH.   albuterol 1.25 MG/3ML nebulizer solution Commonly known as: ACCUNEB Take 3 mLs (1.25 mg total) by nebulization 3 (three) times daily as needed  for wheezing.   ALPRAZolam 1 MG tablet Commonly known as: XANAX Take 1 tablet (1 mg total) by mouth 2 (two) times daily as needed for anxiety.   AMBULATORY NON FORMULARY MEDICATION Medication Name: Portable battery-powered nebulizer.Dx: COPD. [place] [phone] ([phone])   AMBULATORY NON FORMULARY MEDICATION Medication Name: Upright standing Rollator with seat. Dx: code M51.36   amLODipine 5 MG tablet Commonly known  as: NORVASC TAKE 1 TABLET (5 MG TOTAL) BY MOUTH DAILY.   B-D 3CC LUER-LOK SYR 25GX1" 25G X 1" 3 ML Misc Generic drug: SYRINGE-NEEDLE (DISP) 3 ML Inject into the muscle once a week.   diclofenac 75 MG EC tablet Commonly known as: VOLTAREN Take 75 mg by mouth 2 (two) times daily.   fluticasone 50 MCG/ACT nasal spray Commonly known as: FLONASE SPRAY 2 SPRAYS INTO EACH NOSTRIL EVERY DAY   Fusion Plus Caps Take 1 capsule by mouth daily.   gabapentin 600 MG tablet Commonly known as: NEURONTIN Take 1 tablet (600 mg total) by mouth 2 (two) times daily. Patient takes 1200 mg daily   ipratropium 0.02 % nebulizer solution Commonly known as: ATROVENT Take 2.5 mLs (0.5 mg total) by nebulization 3 (three) times daily as needed for wheezing or shortness of breath.   ipratropium 0.06 % nasal spray Commonly known as: ATROVENT Place 2 sprays into both nostrils 4 (four) times daily.   levocetirizine 5 MG tablet Commonly known as: XYZAL TAKE 1 TABLET BY MOUTH EVERY DAY IN THE EVENING   levothyroxine 88 MCG tablet Commonly known as: SYNTHROID Take 1 tablet (88 mcg total) by mouth daily.   losartan 100 MG tablet Commonly known as: COZAAR Take 1 tablet (100 mg total) by mouth daily. Appt for further refills   mupirocin ointment 2 % Commonly known as: BACTROBAN APPLY TO INSIDE OF EACH NARES DAILY FOR 10 DAYS THEN TWICE A WEEK FOR MAINTENANCE.   nystatin powder Commonly known as: MYCOSTATIN/NYSTOP APPLY 1 APPLICATION TOPICALLY 3 (THREE) TIMES DAILY.   Olopatadine HCl 0.2 % Soln Apply 1 drop to eye daily. To each eye   ondansetron 8 MG tablet Commonly known as: ZOFRAN Take 8 mg by mouth 3 (three) times daily.   Oxycodone HCl 10 MG Tabs Take 1 tablet (10 mg total) by mouth every 8 (eight) hours.   pantoprazole 40 MG tablet Commonly known as: PROTONIX Take 1 tablet (40 mg total) by mouth daily.   predniSONE 5 MG tablet Commonly known as: DELTASONE TAKE 1/2 TABLET (2.5 MG TOTAL)  BY MOUTH DAILY WITH BREAKFAST.   predniSONE 20 MG tablet Commonly known as: DELTASONE Take 1 tablet (20 mg total) by mouth daily with breakfast.   promethazine 25 MG tablet Commonly known as: PHENERGAN TAKE 1 TABLET BY MOUTH 2 TIMES A DAY AS NEEDED FOR NAUSEA OR VOMITING   rosuvastatin 20 MG tablet Commonly known as: CRESTOR Take 1 tablet (20 mg total) by mouth daily.   sertraline 100 MG tablet Commonly known as: ZOLOFT TAKE 2 TABLETS BY MOUTH EVERY DAY   theophylline 300 MG 12 hr tablet Commonly known as: THEODUR TAKE 1 TABLET BY MOUTH TWICE A DAY   tiZANidine 4 MG tablet Commonly known as: ZANAFLEX TAKE 1 TABLET BY MOUTH AT BEDTIME AS NEEDED FOR MUSCLE SPASMS.   traZODone 100 MG tablet Commonly known as: DESYREL Take 1 tablet (100 mg total) by mouth at bedtime.   Trelegy Ellipta 100-62.5-25 MCG/ACT Aepb Generic drug: Fluticasone-Umeclidin-Vilant TAKE 1 PUFF BY MOUTH EVERY DAY  History (reviewed): Past Medical History:  Diagnosis Date   Allergy    Hyperlipidemia    Pain    Thyroid disease    Past Surgical History:  Procedure Laterality Date   CESAREAN SECTION     CHOLECYSTECTOMY     lipoma removal     RT shoulder   LUMBAR SPINE SURGERY  12/23/2011   Dr. Owens Shark, L4-5   pilonydal cyst  12/22/1992   TOTAL ABDOMINAL HYSTERECTOMY  1999   for abnormal cervical cells and fibroids   Family History  Problem Relation Age of Onset   Diabetes Brother    Depression Brother        bipolar   Cancer Brother        throat   Diabetes Other        grandmother   Depression Mother    Hyperlipidemia Mother    Hypertension Mother    Pancreatic cancer Mother    Hypertension Father    Social History   Socioeconomic History   Marital status: Divorced    Spouse name: Not on file   Number of children: 1   Years of education: 1   Highest education level: High school graduate  Occupational History   Occupation: Disabled  Tobacco Use   Smoking status: Former     Packs/day: 1.00    Types: Cigarettes    Quit date: 03/19/2020    Years since quitting: 2.4   Smokeless tobacco: Never  Substance and Sexual Activity   Alcohol use: Not Currently   Drug use: No   Sexual activity: Not Currently  Other Topics Concern   Not on file  Social History Narrative   Lives with her daughter. She enjoys playing games on her computer.   Social Determinants of Health   Financial Resource Strain: Not on file  Food Insecurity: Not on file  Transportation Needs: Not on file  Physical Activity: Not on file  Stress: Not on file  Social Connections: Not on file    Activities of Daily Living    08/18/2022    2:47 PM  In your present state of health, do you have any difficulty performing the following activities:  Hearing? 1  Comment some hearing loss in both ears; worse in left ear.  Vision? 1  Comment hx of glaucoma; having glare again since the cataract surgery.  Difficulty concentrating or making decisions? 1  Comment some memory loss  Walking or climbing stairs? 1  Comment she is not able to  Dressing or bathing? 0  Doing errands, shopping? 1  Comment she doesn't drive her daughter helps  Preparing Food and eating ? Y  Comment her daughter helps with that.  Using the Toilet? N  In the past six months, have you accidently leaked urine? N  Do you have problems with loss of bowel control? N  Managing your Medications? N  Managing your Finances? Y  Comment her daughter helps with that.  Housekeeping or managing your Housekeeping? Y  Comment her daughter helps with that.    Patient Education/ Literacy How often do you need to have someone help you when you read instructions, pamphlets, or other written materials from your doctor or pharmacy?: 1 - Never What is the last grade level you completed in school?: 12th grade.  Exercise Current Exercise Habits: The patient does not participate in regular exercise at present, Exercise limited by: orthopedic  condition(s)  Diet Patient reports consuming 1 meals a day and 3 snack(s) a day Patient  reports that her primary diet is: Regular Patient reports that she does have regular access to food.   Depression Screen    08/18/2022    2:44 PM 11/28/2021    2:52 PM 07/18/2021    2:10 PM 12/29/2018   11:48 AM 07/22/2018    1:45 PM 07/21/2018    9:13 AM 01/27/2018    1:45 PM  PHQ 2/9 Scores  PHQ - 2 Score 4 0 6   4 4   PHQ- 9 Score 4  21   21 16   Exception Documentation    Patient refusal        Information is confidential and restricted. Go to Review Flowsheets to unlock data.     Fall Risk    08/18/2022    2:43 PM 06/17/2022    2:10 PM 01/01/2022   10:12 AM 11/28/2021    4:02 PM 11/28/2021    2:44 PM  Fall Risk   Falls in the past year? 0 0 0  0  Number falls in past yr: 0 0 0  0  Injury with Fall? 0 0 0  0  Risk for fall due to : No Fall Risks No Fall Risks Impaired mobility No Fall Risks   Follow up Falls evaluation completed Falls prevention discussed;Falls evaluation completed Falls prevention discussed;Falls evaluation completed Falls prevention discussed;Falls evaluation completed Falls evaluation completed;Falls prevention discussed     Objective:  Krystal Burgess seemed alert and oriented and she participated appropriately during our telephone visit.  Blood Pressure Weight BMI  BP Readings from Last 3 Encounters:  11/28/21 138/81  03/04/21 114/70  01/23/21 111/62   Wt Readings from Last 3 Encounters:  06/17/22 248 lb (112.5 kg)  06/02/22 265 lb (120.2 kg)  04/04/22 247 lb (112 kg)   BMI Readings from Last 1 Encounters:  06/17/22 48.43 kg/m    *Unable to obtain current vital signs, weight, and BMI due to telephone visit type  Hearing/Vision  Krystal Burgess did not seem to have difficulty with hearing/understanding during the telephone conversation Reports that she has not had a formal eye exam by an eye care professional within the past year Reports that she has not had a formal  hearing evaluation within the past year *Unable to fully assess hearing and vision during telephone visit type  Cognitive Function:    08/18/2022    2:50 PM  6CIT Screen  What Year? 0 points  What month? 0 points  What time? 0 points  Count back from 20 0 points  Months in reverse 0 points  Repeat phrase 0 points  Total Score 0 points   (Normal:0-7, Significant for Dysfunction: >8)  Normal Cognitive Function Screening: Yes   Immunization & Health Maintenance Record Immunization History  Administered Date(s) Administered   Influenza Split 09/07/2012   Influenza Whole 08/26/2011   Influenza,inj,Quad PF,6+ Mos 08/31/2013, 09/07/2014, 11/01/2015, 08/14/2016, 09/17/2017, 08/17/2018, 10/03/2019, 11/28/2021   Influenza,inj,quad, With Preservative 03/21/2016   Influenza-Unspecified 03/21/2016   Pneumococcal Polysaccharide-23 10/14/2011   Tdap 11/05/2017    Health Maintenance  Topic Date Due   PAP SMEAR-Modifier  08/18/2022 (Originally 12/22/2010)   OPHTHALMOLOGY EXAM  08/18/2022 (Originally 06/01/2020)   HEMOGLOBIN A1C  08/19/2022 (Originally 05/29/2022)   Zoster Vaccines- Shingrix (1 of 2) 11/18/2022 (Originally 03/01/1982)   INFLUENZA VACCINE  03/22/2023 (Originally 07/22/2022)   MAMMOGRAM  08/19/2023 (Originally 04/25/2019)   COVID-19 Vaccine (1) 01/18/2024 (Originally 03/01/1966)   Diabetic kidney evaluation - GFR measurement  11/28/2022   Diabetic kidney evaluation - Urine  ACR  11/28/2022   FOOT EXAM  11/28/2022   COLONOSCOPY (Pts 45-77yrs Insurance coverage will need to be confirmed)  10/19/2024   TETANUS/TDAP  11/06/2027   Hepatitis C Screening  Completed   HIV Screening  Completed   HPV VACCINES  Aged Out   Pneumococcal Vaccine 11-6 Years old  Discontinued       Assessment  This is a routine wellness examination for Krystal Burgess.  Health Maintenance: Due or Overdue There are no preventive care reminders to display for this patient.   Krystal Burgess does not  need a referral for Community Assistance: Care Management:   no Social Work:    no Prescription Assistance:  no Nutrition/Diabetes Education:  no   Plan:  Personalized Goals  Goals Addressed               This Visit's Progress     Patient Stated (pt-stated)        Patient would like to be able to walk around and do ADLs by herself.       Personalized Health Maintenance & Screening Recommendations  Influenza vaccine Screening mammography Screening Pap smear and pelvic exam  Shingrix vaccine Eye exam Hemoglobin A1C  Lung Cancer Screening Recommended: yes (Low Dose CT Chest recommended if Age 10-80 years, 30 pack-year currently smoking OR have quit w/in past 15 years) Hepatitis C Screening recommended: no HIV Screening recommended: no  Advanced Directives: Written information was prepared per patient's request.  Referrals & Orders Orders Placed This Encounter  Procedures   Ambulatory Referral for Lung Cancer Scre    Follow-up Plan Follow-up with Agapito Games, MD as planned Schedule your Shingrix vaccine at your pharmacy.  Please schedule your eye exam. Medicare wellness visit in one year. Patient will access AVS on my chart.   I have personally reviewed and noted the following in the patient's chart:   Medical and social history Use of alcohol, tobacco or illicit drugs  Current medications and supplements Functional ability and status Nutritional status Physical activity Advanced directives List of other physicians Hospitalizations, surgeries, and ER visits in previous 12 months Vitals Screenings to include cognitive, depression, and falls Referrals and appointments  In addition, I have reviewed and discussed with Krystal Burgess certain preventive protocols, quality metrics, and best practice recommendations. A written personalized care plan for preventive services as well as general preventive health recommendations is available and can be  mailed to the patient at her request.      Modesto Charon, RN BSN  08/18/2022

## 2022-08-20 ENCOUNTER — Other Ambulatory Visit: Payer: Self-pay | Admitting: Family Medicine

## 2022-08-27 NOTE — Telephone Encounter (Signed)
Hi Krystal Burgess, just checking on this since its been a couple of weeks to see if we have an appointment somewhere

## 2022-08-28 ENCOUNTER — Telehealth (INDEPENDENT_AMBULATORY_CARE_PROVIDER_SITE_OTHER): Payer: Medicare Other | Admitting: Family Medicine

## 2022-08-28 ENCOUNTER — Ambulatory Visit: Payer: Medicare Other

## 2022-08-28 ENCOUNTER — Encounter: Payer: Self-pay | Admitting: Family Medicine

## 2022-08-28 DIAGNOSIS — J209 Acute bronchitis, unspecified: Secondary | ICD-10-CM | POA: Diagnosis not present

## 2022-08-28 DIAGNOSIS — E611 Iron deficiency: Secondary | ICD-10-CM | POA: Diagnosis not present

## 2022-08-28 DIAGNOSIS — J441 Chronic obstructive pulmonary disease with (acute) exacerbation: Secondary | ICD-10-CM

## 2022-08-28 DIAGNOSIS — E039 Hypothyroidism, unspecified: Secondary | ICD-10-CM | POA: Diagnosis not present

## 2022-08-28 DIAGNOSIS — E1169 Type 2 diabetes mellitus with other specified complication: Secondary | ICD-10-CM

## 2022-08-28 DIAGNOSIS — Z13220 Encounter for screening for lipoid disorders: Secondary | ICD-10-CM

## 2022-08-28 DIAGNOSIS — E119 Type 2 diabetes mellitus without complications: Secondary | ICD-10-CM

## 2022-08-28 DIAGNOSIS — J329 Chronic sinusitis, unspecified: Secondary | ICD-10-CM | POA: Diagnosis not present

## 2022-08-28 MED ORDER — ALPRAZOLAM 1 MG PO TABS: 0.5000 mg | ORAL_TABLET | Freq: Two times a day (BID) | ORAL | 0 refills | Status: DC | PRN

## 2022-08-28 MED ORDER — CEFDINIR 300 MG PO CAPS: 300.0000 mg | ORAL_CAPSULE | Freq: Two times a day (BID) | ORAL | 0 refills | Status: DC

## 2022-08-28 MED ORDER — PREDNISONE 20 MG PO TABS: 40.0000 mg | ORAL_TABLET | Freq: Every day | ORAL | 0 refills | Status: DC

## 2022-08-28 NOTE — Progress Notes (Signed)
Virtual Visit via Video Note  I connected with Krystal Burgess on 08/28/22 at  3:00 PM EDT by a video enabled telemedicine application and verified that I am speaking with the correct person using two identifiers.   I discussed the limitations of evaluation and management by telemedicine and the availability of in person appointments. The patient expressed understanding and agreed to proceed.  Patient location: at home Provider location: in office  Subjective:    CC:  No chief complaint on file.   HPI:  She reports that her sinuses feel worse.  She says it never completely goes away but did start feeling worse a couple weeks ago.  In the last week she is developed increased shortness of breath with activity as well as increased cough and mucus production.  Denies fevers chills or sweats.  She has a history of recurrent chronic sinusitis.  She still has not gone for her CT scan of her face that was ordered by the ENT as we have previously discussed multiple times.  Now she has bilateral facial pain and pressure.  Past medical history, Surgical history, Family history not pertinant except as noted below, Social history, Allergies, and medications have been entered into the medical record, reviewed, and corrections made.    Objective:    General: Speaking clearly in complete sentences without any shortness of breath.  Alert and oriented x3.  Normal judgment. No apparent acute distress.    Impression and Recommendations:    Problem List Items Addressed This Visit       Respiratory   COPD exacerbation (HCC)   Relevant Medications   cefdinir (OMNICEF) 300 MG capsule   predniSONE (DELTASONE) 20 MG tablet     Endocrine   Type 2 diabetes mellitus (HCC) - Primary   Relevant Orders   COMPLETE METABOLIC PANEL WITH GFR   POCT glycosylated hemoglobin (Hb A1C)   Hypothyroidism   Relevant Orders   TSH     Other   Iron deficiency   Relevant Orders   CBC with  Differential/Platelet   Other Visit Diagnoses     Lipid screening       Relevant Orders   Lipid Panel w/reflex Direct LDL   Sinobronchitis       Relevant Medications   cefdinir (OMNICEF) 300 MG capsule   predniSONE (DELTASONE) 20 MG tablet       Acute bronchitis/COPD exacerbation-we will go ahead and treat with Omnicef.  Call if not better in 1 week I really want her to make sure that she goes and gets the CT scan done we discussed that we cannot keep treating her every 8 weeks with short-term antibiotics she is can have to have further work-up she may have chronic sinusitis that needs to be treated longer she may have retention cyst there may be other reasons such as polyps that are causing issues.  And this needs to be addressed.  Still has not heard back about her pain management referral.  Orders Placed This Encounter  Procedures   TSH   Lipid Panel w/reflex Direct LDL   COMPLETE METABOLIC PANEL WITH GFR   CBC with Differential/Platelet   POCT glycosylated hemoglobin (Hb A1C)    Meds ordered this encounter  Medications   cefdinir (OMNICEF) 300 MG capsule    Sig: Take 1 capsule (300 mg total) by mouth 2 (two) times daily.    Dispense:  20 capsule    Refill:  0   predniSONE (DELTASONE) 20 MG  tablet    Sig: Take 2 tablets (40 mg total) by mouth daily with breakfast.    Dispense:  10 tablet    Refill:  0   ALPRAZolam (XANAX) 1 MG tablet    Sig: Take 0.5-1 tablets (0.5-1 mg total) by mouth 2 (two) times daily as needed for anxiety. Please use sparingly    Dispense:  60 tablet    Refill:  0   She plans to schedule her appointment later with me.  She was originally supposed to come in for diabetes follow-up today.  I discussed the assessment and treatment plan with the patient. The patient was provided an opportunity to ask questions and all were answered. The patient agreed with the plan and demonstrated an understanding of the instructions.   The patient was advised to  call back or seek an in-person evaluation if the symptoms worsen or if the condition fails to improve as anticipated.I spent 21 minutes on the day of the encounter to include pre-visit record review, face-to-face time with the patient and post visit ordering of test.   Nani Gasser, MD

## 2022-09-01 ENCOUNTER — Other Ambulatory Visit: Payer: Self-pay | Admitting: Family Medicine

## 2022-09-01 DIAGNOSIS — J449 Chronic obstructive pulmonary disease, unspecified: Secondary | ICD-10-CM

## 2022-09-01 DIAGNOSIS — J329 Chronic sinusitis, unspecified: Secondary | ICD-10-CM

## 2022-09-14 ENCOUNTER — Encounter: Payer: Self-pay | Admitting: Family Medicine

## 2022-09-14 DIAGNOSIS — G2581 Restless legs syndrome: Secondary | ICD-10-CM

## 2022-09-15 MED ORDER — OXYCODONE HCL 10 MG PO TABS: 10.0000 mg | ORAL_TABLET | Freq: Three times a day (TID) | ORAL | 0 refills | Status: DC

## 2022-09-15 NOTE — Telephone Encounter (Signed)
Refilled but she definitely needs to continue to call them back and get scheduled for an appointment time and date.

## 2022-09-16 ENCOUNTER — Other Ambulatory Visit: Payer: Self-pay | Admitting: Family Medicine

## 2022-09-19 ENCOUNTER — Ambulatory Visit: Payer: Medicare Other | Admitting: Family Medicine

## 2022-09-26 ENCOUNTER — Other Ambulatory Visit: Payer: Self-pay | Admitting: Family Medicine

## 2022-09-26 DIAGNOSIS — E119 Type 2 diabetes mellitus without complications: Secondary | ICD-10-CM

## 2022-09-26 NOTE — Telephone Encounter (Signed)
I spoke with Dr. Madilyn Fireman in person about this patient I will try calling [facility] where it was sent again and see what the status is - CF

## 2022-10-04 ENCOUNTER — Other Ambulatory Visit: Payer: Self-pay | Admitting: Family Medicine

## 2022-10-04 DIAGNOSIS — F3132 Bipolar disorder, current episode depressed, moderate: Secondary | ICD-10-CM

## 2022-10-06 ENCOUNTER — Ambulatory Visit: Payer: Medicare Other | Admitting: Family Medicine

## 2022-10-14 ENCOUNTER — Encounter: Payer: Self-pay | Admitting: Family Medicine

## 2022-10-14 DIAGNOSIS — G2581 Restless legs syndrome: Secondary | ICD-10-CM

## 2022-10-15 MED ORDER — OXYCODONE HCL 10 MG PO TABS: 10.0000 mg | ORAL_TABLET | Freq: Three times a day (TID) | ORAL | 0 refills | Status: DC

## 2022-10-15 NOTE — Telephone Encounter (Signed)
Meds ordered this encounter  Medications   Oxycodone HCl 10 MG TABS    Sig: Take 1 tablet (10 mg total) by mouth every 8 (eight) hours.    Dispense:  90 tablet    Refill:  0

## 2022-10-17 ENCOUNTER — Other Ambulatory Visit: Payer: Self-pay | Admitting: Family Medicine

## 2022-10-22 ENCOUNTER — Other Ambulatory Visit: Payer: Self-pay | Admitting: Family Medicine

## 2022-10-22 DIAGNOSIS — J329 Chronic sinusitis, unspecified: Secondary | ICD-10-CM

## 2022-10-24 ENCOUNTER — Other Ambulatory Visit: Payer: Self-pay | Admitting: Family Medicine

## 2022-10-24 ENCOUNTER — Telehealth (INDEPENDENT_AMBULATORY_CARE_PROVIDER_SITE_OTHER): Payer: Medicare Other | Admitting: Family Medicine

## 2022-10-24 DIAGNOSIS — J441 Chronic obstructive pulmonary disease with (acute) exacerbation: Secondary | ICD-10-CM | POA: Diagnosis not present

## 2022-10-24 DIAGNOSIS — J069 Acute upper respiratory infection, unspecified: Secondary | ICD-10-CM

## 2022-10-24 MED ORDER — PREDNISONE 20 MG PO TABS: 40.0000 mg | ORAL_TABLET | Freq: Every day | ORAL | 0 refills | Status: DC

## 2022-10-24 NOTE — Progress Notes (Signed)
Pt reports that she has had sxs for the past 2-3 days. Her grandson has been sick.   Her sxs are nasal congestion, cough, chest congestion. She has been taking OTC cold and flu medication. Denies any f/s/c/n/v/d.  She has had pain and pressure in her forehead, and face, her throat has been bothering her,

## 2022-10-24 NOTE — Progress Notes (Signed)
   Acute Office Visit  Subjective:     Patient ID: Krystal Burgess, female    DOB: [birth year], 61 y.o.   MRN: [id number]  Chief Complaint  Patient presents with   Sinusitis    HPI Patient is in today for URI  for 2-3 days of sxs that started. Pain and pressure in her face/forehead and forehead.  No diarrhea.  No ear pain.  Mild ST.  No fever. Lots fo mucous in her throat. Felt had more congestion in her chest today, light green.  No wheezing.   Several family members have been sick as well.   No vitals collected.    ROS      Objective:    There were no vitals taken for this visit.   Physical Exam Vitals reviewed.  Constitutional:      Appearance: Normal appearance. She is well-developed.  HENT:     Head: Normocephalic and atraumatic.  Eyes:     Conjunctiva/sclera: Conjunctivae normal.  Cardiovascular:     Rate and Rhythm: Normal rate.  Pulmonary:     Effort: Pulmonary effort is normal.  Skin:    General: Skin is dry.     Coloration: Skin is not pale.  Neurological:     Mental Status: She is alert and oriented to person, place, and time.  Psychiatric:        Behavior: Behavior normal.     No results found for any visits on 10/24/22.      Assessment & Plan:   Problem List Items Addressed This Visit       Respiratory   COPD exacerbation ([place])   Relevant Medications   predniSONE (DELTASONE) 20 MG tablet   Other Visit Diagnoses     Viral upper respiratory tract infection    -  Primary      URI - likely viral.  Will tx with prednisone for COPD exacerbation. Symptomatic care.  Cal if not better in one week.    Meds ordered this encounter  Medications   predniSONE (DELTASONE) 20 MG tablet    Sig: Take 2 tablets (40 mg total) by mouth daily with breakfast.    Dispense:  14 tablet    Refill:  0    No follow-ups on file.  Beatrice Lecher, MD  I spent 15 minutes on the day of the encounter to include pre-visit record review, face-to-face time  with the patient and post visit ordering of test.

## 2022-11-04 ENCOUNTER — Other Ambulatory Visit: Payer: Self-pay | Admitting: Family Medicine

## 2022-11-04 DIAGNOSIS — K2100 Gastro-esophageal reflux disease with esophagitis, without bleeding: Secondary | ICD-10-CM

## 2022-11-11 ENCOUNTER — Telehealth (INDEPENDENT_AMBULATORY_CARE_PROVIDER_SITE_OTHER): Payer: Medicare Other | Admitting: Family Medicine

## 2022-11-11 DIAGNOSIS — M48061 Spinal stenosis, lumbar region without neurogenic claudication: Secondary | ICD-10-CM

## 2022-11-11 DIAGNOSIS — G2581 Restless legs syndrome: Secondary | ICD-10-CM | POA: Diagnosis not present

## 2022-11-11 DIAGNOSIS — J441 Chronic obstructive pulmonary disease with (acute) exacerbation: Secondary | ICD-10-CM | POA: Diagnosis not present

## 2022-11-11 MED ORDER — PREDNISONE 20 MG PO TABS: 40.0000 mg | ORAL_TABLET | Freq: Every day | ORAL | 0 refills | Status: DC

## 2022-11-11 MED ORDER — AZITHROMYCIN 250 MG PO TABS: ORAL_TABLET | ORAL | 0 refills | Status: AC

## 2022-11-11 MED ORDER — OXYCODONE HCL 10 MG PO TABS: 10.0000 mg | ORAL_TABLET | Freq: Three times a day (TID) | ORAL | 0 refills | Status: DC

## 2022-11-11 NOTE — Progress Notes (Signed)
Virtual Visit via Video Note  I connected with [name] on 11/11/22 at  1:00 PM EST by a video enabled telemedicine application and verified that I am speaking with the correct person using two identifiers.   I discussed the limitations of evaluation and management by telemedicine and the availability of in person appointments. The patient expressed understanding and agreed to proceed.  Patient location: at home Provider location: in office  Subjective:    CC:   Chief Complaint  Patient presents with   Cough    Coughing up green phlegm and headaches    HPI: Started with some frontal headache and then started feeling throbbing sensation with movement. Then when got back home started feeling more congested when got back around the cats.  Then the next day started coughing up phlegm.  Yesterday was strugging with her breathing and had to use her NEB extra. Now getting pain in her maxillary sinuses and ossoe mild ear pain.   He also missed her appointment with pain management but has not rescheduled for the end of November so she wanted to know if she could have at least another 30-day supply on her pain medications.   Past medical history, Surgical history, Family history not pertinant except as noted below, Social history, Allergies, and medications have been entered into the medical record, reviewed, and corrections made.    Objective:    General: Speaking clearly in complete sentences without any shortness of breath.  Alert and oriented x3.  Normal judgment. No apparent acute distress.    Impression and Recommendations:    Problem List Items Addressed This Visit       Respiratory   COPD exacerbation (HCC) - Primary   Relevant Medications   azithromycin (ZITHROMAX) 250 MG tablet   predniSONE (DELTASONE) 20 MG tablet     Other   Spinal stenosis of lumbar region   Relevant Medications   Oxycodone HCl 10 MG TABS (Start on 11/14/2022)   RLS (restless legs  syndrome)    Acute COPD exacerbation-we will treat with azithromycin if she is not feeling better in the next day or 2 okay to fill the prednisone as well.  She just had multiple courses of prednisone so try to minimize use.  Spinal stenosis - I did refill 30 more days on her tabs which is due on Saturday.  That should cover her until her next appointment but stressed to her that she has to get an appointment with them that we will not fill the medication anymore.   No orders of the defined types were placed in this encounter.   Meds ordered this encounter  Medications   azithromycin (ZITHROMAX) 250 MG tablet    Sig: 2 Ttabs PO on Day 1, then one a day x 4 days.    Dispense:  6 tablet    Refill:  0   predniSONE (DELTASONE) 20 MG tablet    Sig: Take 2 tablets (40 mg total) by mouth daily with breakfast.    Dispense:  10 tablet    Refill:  0   Oxycodone HCl 10 MG TABS    Sig: Take 1 tablet (10 mg total) by mouth every 8 (eight) hours.    Dispense:  90 tablet    Refill:  0     I discussed the assessment and treatment plan with the patient. The patient was provided an opportunity to ask questions and all were answered. The patient agreed with the plan and  demonstrated an understanding of the instructions.   The patient was advised to call back or seek an in-person evaluation if the symptoms worsen or if the condition fails to improve as anticipated.  I spent 20 minutes on the day of the encounter to include pre-visit record review, face-to-face time with the patient and post visit ordering of test.   Nani Gasser, MD

## 2022-11-11 NOTE — Progress Notes (Signed)
Pt also requested refill of Oxycodone. She stated that she will run out before she is seen by pain management. She stated that she still has some medication left but doesn't want to run out. She believes that her appointment is either on 12/19 or 12/29 with pain management. She also has an eye appointment scheduled and is confused about which appointment date is for which appointment.   Pt has still not got her labs done from September.

## 2022-11-15 ENCOUNTER — Other Ambulatory Visit: Payer: Self-pay | Admitting: Family Medicine

## 2022-11-19 ENCOUNTER — Other Ambulatory Visit: Payer: Self-pay | Admitting: Family Medicine

## 2022-11-21 ENCOUNTER — Other Ambulatory Visit: Payer: Self-pay | Admitting: Family Medicine

## 2022-12-02 DIAGNOSIS — Z23 Encounter for immunization: Secondary | ICD-10-CM | POA: Diagnosis not present

## 2022-12-02 DIAGNOSIS — R531 Weakness: Secondary | ICD-10-CM | POA: Diagnosis not present

## 2022-12-02 DIAGNOSIS — H409 Unspecified glaucoma: Secondary | ICD-10-CM | POA: Diagnosis not present

## 2022-12-02 DIAGNOSIS — J984 Other disorders of lung: Secondary | ICD-10-CM | POA: Diagnosis not present

## 2022-12-02 DIAGNOSIS — J9621 Acute and chronic respiratory failure with hypoxia: Secondary | ICD-10-CM | POA: Diagnosis not present

## 2022-12-02 DIAGNOSIS — Z888 Allergy status to other drugs, medicaments and biological substances status: Secondary | ICD-10-CM | POA: Diagnosis not present

## 2022-12-02 DIAGNOSIS — R059 Cough, unspecified: Secondary | ICD-10-CM | POA: Diagnosis not present

## 2022-12-02 DIAGNOSIS — G2581 Restless legs syndrome: Secondary | ICD-10-CM | POA: Diagnosis not present

## 2022-12-02 DIAGNOSIS — J45909 Unspecified asthma, uncomplicated: Secondary | ICD-10-CM | POA: Diagnosis not present

## 2022-12-02 DIAGNOSIS — E538 Deficiency of other specified B group vitamins: Secondary | ICD-10-CM | POA: Diagnosis not present

## 2022-12-02 DIAGNOSIS — M7752 Other enthesopathy of left foot: Secondary | ICD-10-CM | POA: Diagnosis not present

## 2022-12-02 DIAGNOSIS — D649 Anemia, unspecified: Secondary | ICD-10-CM | POA: Diagnosis not present

## 2022-12-02 DIAGNOSIS — R519 Headache, unspecified: Secondary | ICD-10-CM | POA: Diagnosis not present

## 2022-12-02 DIAGNOSIS — E039 Hypothyroidism, unspecified: Secondary | ICD-10-CM | POA: Diagnosis not present

## 2022-12-02 DIAGNOSIS — W19XXXD Unspecified fall, subsequent encounter: Secondary | ICD-10-CM | POA: Diagnosis not present

## 2022-12-02 DIAGNOSIS — E1165 Type 2 diabetes mellitus with hyperglycemia: Secondary | ICD-10-CM | POA: Diagnosis not present

## 2022-12-02 DIAGNOSIS — E1142 Type 2 diabetes mellitus with diabetic polyneuropathy: Secondary | ICD-10-CM | POA: Diagnosis not present

## 2022-12-02 DIAGNOSIS — N179 Acute kidney failure, unspecified: Secondary | ICD-10-CM | POA: Diagnosis not present

## 2022-12-02 DIAGNOSIS — R6510 Systemic inflammatory response syndrome (SIRS) of non-infectious origin without acute organ dysfunction: Secondary | ICD-10-CM | POA: Diagnosis not present

## 2022-12-02 DIAGNOSIS — J440 Chronic obstructive pulmonary disease with acute lower respiratory infection: Secondary | ICD-10-CM | POA: Diagnosis not present

## 2022-12-02 DIAGNOSIS — J986 Disorders of diaphragm: Secondary | ICD-10-CM | POA: Diagnosis not present

## 2022-12-02 DIAGNOSIS — R0602 Shortness of breath: Secondary | ICD-10-CM | POA: Diagnosis not present

## 2022-12-02 DIAGNOSIS — N189 Chronic kidney disease, unspecified: Secondary | ICD-10-CM | POA: Diagnosis not present

## 2022-12-02 DIAGNOSIS — K219 Gastro-esophageal reflux disease without esophagitis: Secondary | ICD-10-CM | POA: Diagnosis not present

## 2022-12-02 DIAGNOSIS — J0100 Acute maxillary sinusitis, unspecified: Secondary | ICD-10-CM | POA: Diagnosis not present

## 2022-12-02 DIAGNOSIS — F17210 Nicotine dependence, cigarettes, uncomplicated: Secondary | ICD-10-CM | POA: Diagnosis not present

## 2022-12-02 DIAGNOSIS — N183 Chronic kidney disease, stage 3 unspecified: Secondary | ICD-10-CM | POA: Diagnosis not present

## 2022-12-02 DIAGNOSIS — Z880 Allergy status to penicillin: Secondary | ICD-10-CM | POA: Diagnosis not present

## 2022-12-02 DIAGNOSIS — I2510 Atherosclerotic heart disease of native coronary artery without angina pectoris: Secondary | ICD-10-CM | POA: Diagnosis not present

## 2022-12-02 DIAGNOSIS — R911 Solitary pulmonary nodule: Secondary | ICD-10-CM | POA: Diagnosis not present

## 2022-12-02 DIAGNOSIS — N185 Chronic kidney disease, stage 5: Secondary | ICD-10-CM | POA: Insufficient documentation

## 2022-12-02 DIAGNOSIS — M25511 Pain in right shoulder: Secondary | ICD-10-CM | POA: Diagnosis not present

## 2022-12-02 DIAGNOSIS — R590 Localized enlarged lymph nodes: Secondary | ICD-10-CM | POA: Diagnosis not present

## 2022-12-02 DIAGNOSIS — R0989 Other specified symptoms and signs involving the circulatory and respiratory systems: Secondary | ICD-10-CM | POA: Diagnosis not present

## 2022-12-02 DIAGNOSIS — E785 Hyperlipidemia, unspecified: Secondary | ICD-10-CM | POA: Diagnosis not present

## 2022-12-02 DIAGNOSIS — Z1152 Encounter for screening for COVID-19: Secondary | ICD-10-CM | POA: Diagnosis not present

## 2022-12-02 DIAGNOSIS — J449 Chronic obstructive pulmonary disease, unspecified: Secondary | ICD-10-CM | POA: Diagnosis not present

## 2022-12-02 DIAGNOSIS — Z886 Allergy status to analgesic agent status: Secondary | ICD-10-CM | POA: Diagnosis not present

## 2022-12-03 LAB — HEMOGLOBIN A1C: Hemoglobin A1C: 5.6

## 2022-12-09 ENCOUNTER — Encounter: Payer: Self-pay | Admitting: *Deleted

## 2022-12-09 ENCOUNTER — Telehealth: Payer: Self-pay | Admitting: *Deleted

## 2022-12-09 NOTE — Patient Outreach (Signed)
  Care Coordination [facility] Note Transition Care Management Unsuccessful Follow-up Telephone Call  Date of discharge and from where:  Monday, 12/08/22 [facility]; shortness of breath; COPD exacerbation secondary to pneumonia; acute on chronic respiratory failure with hypoxia  Attempts:  1st Attempt  Reason for unsuccessful TCM follow-up call:  Left voice message  Caryl Pina, RN, BSN, CCRN Alumnus RN CM Care Coordination/ Transition of Care- [facility] Care Management [phone]: direct office

## 2022-12-10 ENCOUNTER — Encounter: Payer: Self-pay | Admitting: *Deleted

## 2022-12-10 ENCOUNTER — Telehealth: Payer: Self-pay | Admitting: *Deleted

## 2022-12-10 NOTE — Patient Outreach (Signed)
  Care Coordination [facility] Note Transition Care Management Unsuccessful Follow-up Telephone Call  Date of discharge and from where:  Monday, 12/08/22; [facility]; acute on chronic respiratory failure with hypoxia/ pneumonia, shortness of breath  Attempts:  2nd Attempt  Reason for unsuccessful TCM follow-up call:  Left voice message  Caryl Pina, RN, BSN, CCRN Alumnus RN CM Care Coordination/ Transition of Care- [facility] Care Management [phone]: direct office

## 2022-12-11 ENCOUNTER — Telehealth: Payer: Self-pay | Admitting: *Deleted

## 2022-12-11 ENCOUNTER — Encounter: Payer: Self-pay | Admitting: *Deleted

## 2022-12-11 NOTE — Patient Outreach (Signed)
  Care Coordination [facility] Note Transition Care Management Unsuccessful Follow-up Telephone Call  Date of discharge and from where:  Monday, 12/08/22; [facility]; pneumonia/ acute on chronic respiratory failure with hypoxia; COPD exacerbation with shortness of breath  Attempts:  3rd Attempt  Reason for unsuccessful TCM follow-up call:  Left voice message  Caryl Pina, RN, BSN, CCRN Alumnus RN CM Care Coordination/ Transition of Care- [facility] Care Management [phone]: direct office

## 2022-12-16 ENCOUNTER — Ambulatory Visit (INDEPENDENT_AMBULATORY_CARE_PROVIDER_SITE_OTHER): Payer: Medicare Other | Admitting: Family Medicine

## 2022-12-16 ENCOUNTER — Ambulatory Visit (INDEPENDENT_AMBULATORY_CARE_PROVIDER_SITE_OTHER): Payer: Medicare Other

## 2022-12-16 ENCOUNTER — Encounter: Payer: Self-pay | Admitting: Family Medicine

## 2022-12-16 VITALS — BP 126/79 | HR 92 | Ht 64.0 in | Wt 248.0 lb

## 2022-12-16 DIAGNOSIS — J320 Chronic maxillary sinusitis: Secondary | ICD-10-CM

## 2022-12-16 DIAGNOSIS — M79672 Pain in left foot: Secondary | ICD-10-CM | POA: Diagnosis not present

## 2022-12-16 DIAGNOSIS — M48061 Spinal stenosis, lumbar region without neurogenic claudication: Secondary | ICD-10-CM

## 2022-12-16 DIAGNOSIS — N179 Acute kidney failure, unspecified: Secondary | ICD-10-CM

## 2022-12-16 DIAGNOSIS — J189 Pneumonia, unspecified organism: Secondary | ICD-10-CM | POA: Diagnosis not present

## 2022-12-16 DIAGNOSIS — T380X5D Adverse effect of glucocorticoids and synthetic analogues, subsequent encounter: Secondary | ICD-10-CM | POA: Diagnosis not present

## 2022-12-16 DIAGNOSIS — E099 Drug or chemical induced diabetes mellitus without complications: Secondary | ICD-10-CM | POA: Diagnosis not present

## 2022-12-16 MED ORDER — OXYCODONE HCL 10 MG PO TABS: 10.0000 mg | ORAL_TABLET | Freq: Three times a day (TID) | ORAL | 0 refills | Status: DC

## 2022-12-16 MED ORDER — PREDNISONE 20 MG PO TABS: ORAL_TABLET | ORAL | 0 refills | Status: AC

## 2022-12-16 NOTE — Progress Notes (Signed)
Established Patient Office Visit  Subjective   Patient ID: Krystal Burgess, female    DOB: 07-04-1961  Age: 61 y.o. MRN: [id number]  Chief Complaint  Patient presents with   Hospitalization Follow-up    HPI Hospital f/u for left PNA with acute on chronic hypoxic respiratory failure.  He had been treated about 3 weeks prior with azithromycin and prednisone for respiratory symptoms.  Seen [facility] on 12/12 and admitted.  He met criteria for SIRS.  Discharged home 5 days on Omnicef and linezolid later on 1218.  She was having a lot of profuse diarrhea when she was in the hospital but actually has been a little bit better since she has been home.  Last serum Cr ws 1.25 a year ago.  But was elevated to 2 in the hospital.  She fell the day she to the hospital.  And they did actually do a right shoulder x-ray and a left foot x-ray.  She is still concerned about the pain in her foot.  X-ray was negative on December 12.  Says in general she has been in a lot more pain.  And wants to know if she can have a few extra tabs of her oxycodone.  She had to cancel her appointment with pain management because of the hospitalization.  Held her blood pressure pills when she got home because her blood pressures were quite low in the hospital.    ROS    Objective:     BP 126/79   Pulse 92   Ht _0  (1.626 m)   Wt 248 lb (112.5 kg)   SpO2 92%   BMI 42.57 kg/m    Physical Exam Vitals and nursing note reviewed.  Constitutional:      Appearance: She is well-developed.  HENT:     Head: Normocephalic and atraumatic.  Cardiovascular:     Rate and Rhythm: Normal rate and regular rhythm.     Heart sounds: Normal heart sounds.  Pulmonary:     Effort: Pulmonary effort is normal.     Breath sounds: Normal breath sounds.  Skin:    General: Skin is warm and dry.  Neurological:     Mental Status: She is alert and oriented to person, place, and time.  Psychiatric:        Behavior: Behavior  normal.      Results for orders placed or performed in visit on 12/16/22  Hemoglobin A1c  Result Value Ref Range   Hemoglobin A1C 5.6       The ASCVD Risk score (Arnett DK, et al., 2019) failed to calculate for the following reasons:   The valid total cholesterol range is 130 to 320 mg/dL    Assessment & Plan:   Problem List Items Addressed This Visit       Other   Spinal stenosis of lumbar region   Relevant Medications   Oxycodone HCl 10 MG TABS   Other Visit Diagnoses     Pneumonia of left lung due to infectious organism, unspecified part of lung    -  Primary   Relevant Medications   linezolid (ZYVOX) 600 MG tablet   cefdinir (OMNICEF) 300 MG capsule   AKI (acute kidney injury) ([place])       Relevant Orders   Urine Microalbumin w/creat. ratio   BASIC METABOLIC PANEL WITH GFR   Left foot pain       Relevant Orders   DG Foot Complete Left   Left maxillary sinusitis  Relevant Medications   linezolid (ZYVOX) 600 MG tablet   cefdinir (OMNICEF) 300 MG capsule   predniSONE (DELTASONE) 20 MG tablet   Other Relevant Orders   Ambulatory referral to ENT       PNA -  of left lung-she is feeling better overall.  She still getting very winded with activities.  We discussed that it will take 4 to 6 weeks for her to really get back to baseline.  Medical ahead and send in prednisone and did encourage her to go ahead and complete her antibiotic she should have about 2 more days left.  Left foot pain-she is tender over the mid to metatarsals.  Will go ahead and get plain films today even though the initial film did not show a fracture.  If negative then we can get further imaging if needed such as MRI.  It occurred during the fall but she does not remember exactly how her foot was injured she is not sure if it was twisted or if she landed on it.  Acute kidney injury-recheck renal function  Return in about 4 months (around 04/17/2023).    Beatrice Lecher, MD

## 2022-12-17 LAB — BASIC METABOLIC PANEL WITH GFR
BUN/Creatinine Ratio: 8 (calc) (ref 6–22)
BUN: 12 mg/dL (ref 7–25)
CO2: 29 mmol/L (ref 20–32)
Calcium: 8.9 mg/dL (ref 8.6–10.4)
Chloride: 104 mmol/L (ref 98–110)
Creat: 1.43 mg/dL — ABNORMAL HIGH (ref 0.50–1.03)
Glucose, Bld: 97 mg/dL (ref 65–99)
Potassium: 4.2 mmol/L (ref 3.5–5.3)
Sodium: 141 mmol/L (ref 135–146)
eGFR: 42 mL/min/{1.73_m2} — ABNORMAL LOW (ref >=60)

## 2022-12-17 LAB — MICROALBUMIN / CREATININE URINE RATIO
Creatinine, Urine: 123 mg/dL (ref 20–275)
Microalb Creat Ratio: 11 mcg/mg creat (ref <30)
Microalb, Ur: 1.3 mg/dL

## 2022-12-17 NOTE — Progress Notes (Signed)
Hi Krystal Burgess, kidney function is mildly elevated at 1.4, your baseline is closer to 1.2-1.3.  So it is almost back down to normal which is really reassuring.  I would like to recheck it again in 1 month just to make sure that it is going all the way back down just to give your kidneys time to continue to improve and heal.  But this is actually very reassuring.  I suspect that you were pretty dehydrated when you are at the hospital initially.  Excess protein in the urine which is also reassuring that there is not another underlying issue going on with the kidneys.

## 2022-12-18 ENCOUNTER — Other Ambulatory Visit: Payer: Self-pay | Admitting: Family Medicine

## 2022-12-19 NOTE — Progress Notes (Signed)
[name], no sign of acute fracture or dislocation.  Please let me know how your foot is feeling.  If you feel like it is improving we can just continue with ice and elevation.  If you are still having a fair amount of discomfort then we can get further imaging.  Just let me know.

## 2022-12-19 NOTE — Telephone Encounter (Signed)
Lease call patient and let her know that I did refill the alprazolam but we really do have to start weaning it.  I know this is come up before but in combination with her chronic pain medications she is high risk for respiratory death and especially with her need for oxygen so it is really critical that we start tapering off the alprazolam I know we talked about it briefly in the past.  So I did refill it but I filled it for 55 tabs instead of 60 tabs so I would encourage her to try to use a half of a tab here or there so that this lasts her a full 30 days.  Have to start tapering this down for safety reasons.

## 2022-12-24 NOTE — Telephone Encounter (Signed)
I called Krystal Burgess and I told her of Dr. Gardiner Ramus recommendations and she immediately stated "I can't make it without the Alprazolam, I have too many panic attacks". I told Krystal Burgess that this would be the plan moving forward she stated that she understood however, she didn't sound happy about this decision.

## 2022-12-27 ENCOUNTER — Encounter: Payer: Self-pay | Admitting: Family Medicine

## 2022-12-27 DIAGNOSIS — M79672 Pain in left foot: Secondary | ICD-10-CM

## 2022-12-29 NOTE — Telephone Encounter (Signed)
Right foot ordered will likely need authorization.  I do not have enough documentation to justify skin in her leg at this point but we can at least start with her foot.

## 2022-12-31 ENCOUNTER — Encounter: Payer: Self-pay | Admitting: Family Medicine

## 2023-01-01 MED ORDER — ALPRAZOLAM 1 MG PO TABS: 1.0000 mg | ORAL_TABLET | Freq: Two times a day (BID) | ORAL | 0 refills | Status: DC | PRN

## 2023-01-01 NOTE — Telephone Encounter (Signed)
OK will send new rx but needs to start cutting back to one a week we have to start tapering her down bc of her pain meds.

## 2023-01-03 ENCOUNTER — Other Ambulatory Visit: Payer: Self-pay | Admitting: Family Medicine

## 2023-01-03 DIAGNOSIS — F3132 Bipolar disorder, current episode depressed, moderate: Secondary | ICD-10-CM

## 2023-01-03 DIAGNOSIS — J4489 Other specified chronic obstructive pulmonary disease: Secondary | ICD-10-CM

## 2023-01-05 NOTE — Telephone Encounter (Signed)
[facility] requesting med refill for prednisone. Is refill appropriate for refill? Thanks in advance.

## 2023-01-10 DIAGNOSIS — J441 Chronic obstructive pulmonary disease with (acute) exacerbation: Secondary | ICD-10-CM | POA: Diagnosis not present

## 2023-01-10 DIAGNOSIS — J449 Chronic obstructive pulmonary disease, unspecified: Secondary | ICD-10-CM | POA: Diagnosis not present

## 2023-01-13 ENCOUNTER — Other Ambulatory Visit: Payer: Self-pay | Admitting: Family Medicine

## 2023-01-14 ENCOUNTER — Encounter: Payer: Self-pay | Admitting: Physician Assistant

## 2023-01-14 ENCOUNTER — Ambulatory Visit (INDEPENDENT_AMBULATORY_CARE_PROVIDER_SITE_OTHER): Payer: 59 | Admitting: Physician Assistant

## 2023-01-14 DIAGNOSIS — J441 Chronic obstructive pulmonary disease with (acute) exacerbation: Secondary | ICD-10-CM | POA: Diagnosis not present

## 2023-01-14 MED ORDER — LEVOFLOXACIN 500 MG PO TABS: 500.0000 mg | ORAL_TABLET | Freq: Every day | ORAL | 0 refills | Status: DC

## 2023-01-14 MED ORDER — PREDNISONE 20 MG PO TABS: ORAL_TABLET | ORAL | 0 refills | Status: DC

## 2023-01-14 NOTE — Progress Notes (Signed)
..Virtual Visit via Telephone Note  I connected with Krystal Burgess on 01/14/23 at  1:00 PM EST by telephone and verified that I am speaking with the correct person using two identifiers.  Location: Patient: home Provider: clinic  .Marland KitchenParticipating in visit:  Patient:  Provider:  Provider in training:    I discussed the limitations, risks, security and privacy concerns of performing an evaluation and management service by telephone and the availability of in person appointments. I also discussed with the patient that there may be a patient responsible charge related to this service. The patient expressed understanding and agreed to proceed.   History of Present Illness: Pt is a 62 yo obese female with COPD, HTN, OSA, T2DM who calls into the clinic with productive cough, SOB, fever, chills, body aches. She recently had pneumonia in early December. She did get better but about 1 week ago her son in law came home sick. She started coughing and now getting worse and worse. She remains on O2 at 2L continuously.  Coughing up green and brown sputum. Using inhalers daily and rescue. She could have had fever. Not able to check any of her vitals. Negative home covid test.    .. 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   Bipolar disorder ([place]) 01/24/2013   Bilateral carpal tunnel syndrome 03/14/2013   Acute angle-closure  glaucoma 10/03/2013   Type 2 diabetes mellitus ([place]) 09/07/2014   Lumbar degenerative disc disease 08/14/2016   Pulmonary nodule, RML, needs noncontrast CT in February 2019 01/26/2017   Chronic insomnia 01/30/2017   Chronic ethmoidal sinusitis 01/29/2018   RLS (restless legs syndrome) 01/14/2019   Chronic respiratory failure with hypoxia ([place]) 10/23/2017   CKD (chronic kidney disease) stage 3, GFR 30-59 ml/min (HCC) 10/26/2017   Combined forms of age-related cataract of 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   Cutaneous candidiasis 10/24/2017   Sinus tachycardia 04/08/2018   Posterior synechiae (iris), 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   BMI 45.0-49.9, adult ([place]) 10/26/2017   Lower GI bleed 10/18/2014   Leukocytosis 10/08/2014   Hypomagnesemia 10/23/2017   Hypernatremia 04/08/2018   History of anxiety 10/08/2014   Glaucoma 10/23/2017   Gastroesophageal reflux disease without esophagitis 10/12/2014   Diabetic peripheral neuropathy associated with type 2 diabetes mellitus ([place]) 10/12/2014   Chronic back pain 10/24/2017   Elevated d-dimer 03/02/2021   Macrocytic anemia 03/02/2021   Peripheral neuropathy 03/02/2021   OSA (obstructive sleep apnea) 03/02/2021   Abnormal chest CT 75/64/3329   Folic acid deficiency 51/88/4166   Nausea 05/28/2021   COPD exacerbation ([place]) 10/09/2021   Iron deficiency 01/01/2022   Resolved Ambulatory Problems    Diagnosis Date Noted   HYPOKALEMIA 12/12/2008   ANKLE PAIN, LEFT 02/05/2009   SINUSITIS 02/20/2009   COPD (chronic obstructive pulmonary disease) ([place]) 09/09/2017   Acute non-recurrent pansinusitis 01/29/2018   Chronic maxillary sinusitis 08/17/2019   Urinary tract infection due to Enterococcus 10/16/2014   Pneumonia of right upper lobe due to infectious  organism 10/24/2017   Encephalopathy acute 10/12/2014   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   Acute respiratory failure with hypoxia and hypercapnia ([place]) 10/09/2014   CAP (community  acquired pneumonia) 03/02/2021   Accidental fall 03/02/2021   Acute on chronic respiratory failure with hypoxia and hypercapnia ([place]) 03/02/2021   Abdominal pain 03/02/2021   Rhinosinusitis 02/07/2022   Past Medical History:  Diagnosis Date   Allergy    Pain    Thyroid disease     Observations/Objective: No acute distress Labored breathing on phone Productive cough   Assessment and Plan: Marland KitchenMarland KitchenDiagnoses and all orders for this visit:  COPD exacerbation ([place]) -     levofloxacin (LEVAQUIN) 500 MG tablet; Take 1 tablet (500 mg total) by mouth daily. -     predniSONE (DELTASONE) 20 MG tablet; Take 3 tablets for 3 days, take 2 tablets for 3 days, take 1 tablet for 3 days, take 1/2 tablet for 4 days then transition to prevention dose.   Treated COPD exacerbation with levaquin and prednisone Negative home covid test Continue O2 continuously Continue nebulizer and preventative inhalers Follow up if not improving or symptoms worsening   Follow Up Instructions:    I discussed the assessment and treatment plan with the patient. The patient was provided an opportunity to ask questions and all were answered. The patient agreed with the plan and demonstrated an understanding of the instructions.   The patient was advised to call back or seek an in-person evaluation if the symptoms worsen or if the condition fails to improve as anticipated.  I provided 10 minutes of non-face-to-face time during this encounter.   Iran Planas, PA-C

## 2023-01-15 ENCOUNTER — Other Ambulatory Visit: Payer: Self-pay

## 2023-01-15 ENCOUNTER — Inpatient Hospital Stay (HOSPITAL_COMMUNITY)
Admission: EM | Admit: 2023-01-15 | Discharge: 2023-01-20 | DRG: 871 | Disposition: A | Discharge status: Home / Self Care | Payer: 59 | Attending: Internal Medicine | Admitting: Internal Medicine

## 2023-01-15 ENCOUNTER — Encounter (HOSPITAL_COMMUNITY): Payer: Self-pay | Admitting: Emergency Medicine

## 2023-01-15 ENCOUNTER — Emergency Department (HOSPITAL_COMMUNITY): Payer: 59

## 2023-01-15 DIAGNOSIS — Z6841 Body Mass Index (BMI) 40.0 and over, adult: Secondary | ICD-10-CM

## 2023-01-15 DIAGNOSIS — Z79899 Other long term (current) drug therapy: Secondary | ICD-10-CM

## 2023-01-15 DIAGNOSIS — J449 Chronic obstructive pulmonary disease, unspecified: Secondary | ICD-10-CM | POA: Diagnosis present

## 2023-01-15 DIAGNOSIS — J440 Chronic obstructive pulmonary disease with acute lower respiratory infection: Secondary | ICD-10-CM | POA: Diagnosis not present

## 2023-01-15 DIAGNOSIS — R000 Tachycardia, unspecified: Secondary | ICD-10-CM | POA: Diagnosis not present

## 2023-01-15 DIAGNOSIS — Z833 Family history of diabetes mellitus: Secondary | ICD-10-CM

## 2023-01-15 DIAGNOSIS — Z8249 Family history of ischemic heart disease and other diseases of the circulatory system: Secondary | ICD-10-CM

## 2023-01-15 DIAGNOSIS — N179 Acute kidney failure, unspecified: Secondary | ICD-10-CM | POA: Diagnosis not present

## 2023-01-15 DIAGNOSIS — G8929 Other chronic pain: Secondary | ICD-10-CM | POA: Diagnosis present

## 2023-01-15 DIAGNOSIS — E119 Type 2 diabetes mellitus without complications: Secondary | ICD-10-CM

## 2023-01-15 DIAGNOSIS — I2489 Other forms of acute ischemic heart disease: Secondary | ICD-10-CM | POA: Diagnosis not present

## 2023-01-15 DIAGNOSIS — E782 Mixed hyperlipidemia: Secondary | ICD-10-CM | POA: Diagnosis not present

## 2023-01-15 DIAGNOSIS — E876 Hypokalemia: Secondary | ICD-10-CM | POA: Diagnosis present

## 2023-01-15 DIAGNOSIS — R6520 Severe sepsis without septic shock: Secondary | ICD-10-CM | POA: Diagnosis not present

## 2023-01-15 DIAGNOSIS — Z7952 Long term (current) use of systemic steroids: Secondary | ICD-10-CM

## 2023-01-15 DIAGNOSIS — R509 Fever, unspecified: Secondary | ICD-10-CM | POA: Diagnosis not present

## 2023-01-15 DIAGNOSIS — Z9981 Dependence on supplemental oxygen: Secondary | ICD-10-CM | POA: Diagnosis not present

## 2023-01-15 DIAGNOSIS — R0789 Other chest pain: Secondary | ICD-10-CM | POA: Diagnosis not present

## 2023-01-15 DIAGNOSIS — J9601 Acute respiratory failure with hypoxia: Secondary | ICD-10-CM

## 2023-01-15 DIAGNOSIS — Z87891 Personal history of nicotine dependence: Secondary | ICD-10-CM | POA: Diagnosis not present

## 2023-01-15 DIAGNOSIS — E46 Unspecified protein-calorie malnutrition: Secondary | ICD-10-CM | POA: Insufficient documentation

## 2023-01-15 DIAGNOSIS — Z83438 Family history of other disorder of lipoprotein metabolism and other lipidemia: Secondary | ICD-10-CM

## 2023-01-15 DIAGNOSIS — I10 Essential (primary) hypertension: Secondary | ICD-10-CM | POA: Diagnosis present

## 2023-01-15 DIAGNOSIS — M545 Low back pain, unspecified: Secondary | ICD-10-CM | POA: Diagnosis present

## 2023-01-15 DIAGNOSIS — E8809 Other disorders of plasma-protein metabolism, not elsewhere classified: Secondary | ICD-10-CM | POA: Diagnosis present

## 2023-01-15 DIAGNOSIS — Z1152 Encounter for screening for COVID-19: Secondary | ICD-10-CM | POA: Diagnosis not present

## 2023-01-15 DIAGNOSIS — R7989 Other specified abnormal findings of blood chemistry: Secondary | ICD-10-CM | POA: Diagnosis not present

## 2023-01-15 DIAGNOSIS — R6521 Severe sepsis with septic shock: Secondary | ICD-10-CM | POA: Diagnosis present

## 2023-01-15 DIAGNOSIS — E872 Acidosis, unspecified: Secondary | ICD-10-CM | POA: Diagnosis present

## 2023-01-15 DIAGNOSIS — A419 Sepsis, unspecified organism: Secondary | ICD-10-CM | POA: Diagnosis not present

## 2023-01-15 DIAGNOSIS — N189 Chronic kidney disease, unspecified: Secondary | ICD-10-CM | POA: Diagnosis not present

## 2023-01-15 DIAGNOSIS — I959 Hypotension, unspecified: Secondary | ICD-10-CM | POA: Diagnosis not present

## 2023-01-15 DIAGNOSIS — D649 Anemia, unspecified: Secondary | ICD-10-CM | POA: Diagnosis not present

## 2023-01-15 DIAGNOSIS — N185 Chronic kidney disease, stage 5: Secondary | ICD-10-CM | POA: Diagnosis not present

## 2023-01-15 DIAGNOSIS — E1122 Type 2 diabetes mellitus with diabetic chronic kidney disease: Secondary | ICD-10-CM | POA: Diagnosis present

## 2023-01-15 DIAGNOSIS — R911 Solitary pulmonary nodule: Secondary | ICD-10-CM | POA: Diagnosis not present

## 2023-01-15 DIAGNOSIS — E440 Moderate protein-calorie malnutrition: Secondary | ICD-10-CM | POA: Diagnosis present

## 2023-01-15 DIAGNOSIS — J189 Pneumonia, unspecified organism: Secondary | ICD-10-CM | POA: Diagnosis present

## 2023-01-15 DIAGNOSIS — E871 Hypo-osmolality and hyponatremia: Secondary | ICD-10-CM | POA: Diagnosis present

## 2023-01-15 DIAGNOSIS — R112 Nausea with vomiting, unspecified: Secondary | ICD-10-CM | POA: Diagnosis not present

## 2023-01-15 DIAGNOSIS — I120 Hypertensive chronic kidney disease with stage 5 chronic kidney disease or end stage renal disease: Secondary | ICD-10-CM | POA: Diagnosis present

## 2023-01-15 DIAGNOSIS — Z818 Family history of other mental and behavioral disorders: Secondary | ICD-10-CM

## 2023-01-15 DIAGNOSIS — E66813 Obesity, class 3: Secondary | ICD-10-CM | POA: Diagnosis present

## 2023-01-15 DIAGNOSIS — R079 Chest pain, unspecified: Secondary | ICD-10-CM

## 2023-01-15 DIAGNOSIS — R0902 Hypoxemia: Secondary | ICD-10-CM | POA: Diagnosis not present

## 2023-01-15 DIAGNOSIS — J9621 Acute and chronic respiratory failure with hypoxia: Secondary | ICD-10-CM | POA: Diagnosis not present

## 2023-01-15 DIAGNOSIS — Z800 Family history of malignant neoplasm of digestive organs: Secondary | ICD-10-CM

## 2023-01-15 DIAGNOSIS — Z7951 Long term (current) use of inhaled steroids: Secondary | ICD-10-CM

## 2023-01-15 DIAGNOSIS — Z79890 Hormone replacement therapy (postmenopausal): Secondary | ICD-10-CM

## 2023-01-15 DIAGNOSIS — Z90710 Acquired absence of both cervix and uterus: Secondary | ICD-10-CM

## 2023-01-15 LAB — CBC WITH DIFFERENTIAL/PLATELET
Abs Immature Granulocytes: 0.29 10*3/uL — ABNORMAL HIGH (ref 0.00–0.07)
Basophils Absolute: 0.1 10*3/uL (ref 0.0–0.1)
Basophils Relative: 0 %
Eosinophils Absolute: 0 10*3/uL (ref 0.0–0.5)
Eosinophils Relative: 0 %
HCT: 28.1 % — ABNORMAL LOW (ref 36.0–46.0)
Hemoglobin: 8.6 g/dL — ABNORMAL LOW (ref 12.0–15.0)
Immature Granulocytes: 2 %
Lymphocytes Relative: 6 %
Lymphs Abs: 1.1 10*3/uL (ref 0.7–4.0)
MCH: 29.7 pg (ref 26.0–34.0)
MCHC: 30.6 g/dL (ref 30.0–36.0)
MCV: 96.9 fL (ref 80.0–100.0)
Monocytes Absolute: 1.8 10*3/uL — ABNORMAL HIGH (ref 0.1–1.0)
Monocytes Relative: 9 %
Neutro Abs: 16.1 10*3/uL — ABNORMAL HIGH (ref 1.7–7.7)
Neutrophils Relative %: 83 %
Platelets: 292 10*3/uL (ref 150–400)
RBC: 2.9 MIL/uL — ABNORMAL LOW (ref 3.87–5.11)
RDW: 14.6 % (ref 11.5–15.5)
WBC: 19.3 10*3/uL — ABNORMAL HIGH (ref 4.0–10.5)
nRBC: 0 % (ref 0.0–0.2)

## 2023-01-15 LAB — COMPREHENSIVE METABOLIC PANEL
ALT: 26 U/L (ref 0–44)
AST: 67 U/L — ABNORMAL HIGH (ref 15–41)
Albumin: 3 g/dL — ABNORMAL LOW (ref 3.5–5.0)
Alkaline Phosphatase: 94 U/L (ref 38–126)
Anion gap: 13 (ref 5–15)
BUN: 24 mg/dL — ABNORMAL HIGH (ref 6–20)
CO2: 21 mmol/L — ABNORMAL LOW (ref 22–32)
Calcium: 8.6 mg/dL — ABNORMAL LOW (ref 8.9–10.3)
Chloride: 98 mmol/L (ref 98–111)
Creatinine, Ser: 3.86 mg/dL — ABNORMAL HIGH (ref 0.44–1.00)
GFR, Estimated: 13 mL/min — ABNORMAL LOW (ref >60)
Glucose, Bld: 108 mg/dL — ABNORMAL HIGH (ref 70–99)
Potassium: 4.3 mmol/L (ref 3.5–5.1)
Sodium: 132 mmol/L — ABNORMAL LOW (ref 135–145)
Total Bilirubin: 0.6 mg/dL (ref 0.3–1.2)
Total Protein: 7.2 g/dL (ref 6.5–8.1)

## 2023-01-15 LAB — RESP PANEL BY RT-PCR (RSV, FLU A&B, COVID)  RVPGX2
Influenza A by PCR: NEGATIVE
Influenza B by PCR: NEGATIVE
Resp Syncytial Virus by PCR: NEGATIVE
SARS Coronavirus 2 by RT PCR: NEGATIVE

## 2023-01-15 LAB — PROTIME-INR
INR: 1.3 — ABNORMAL HIGH (ref 0.8–1.2)
Prothrombin Time: 16.4 seconds — ABNORMAL HIGH (ref 11.4–15.2)

## 2023-01-15 LAB — APTT: aPTT: 30 seconds (ref 24–36)

## 2023-01-15 LAB — TROPONIN I (HIGH SENSITIVITY)
Troponin I (High Sensitivity): 18 ng/L — ABNORMAL HIGH (ref <18)
Troponin I (High Sensitivity): 14 ng/L (ref <18)

## 2023-01-15 LAB — BRAIN NATRIURETIC PEPTIDE: B Natriuretic Peptide: 70 pg/mL (ref 0.0–100.0)

## 2023-01-15 LAB — LACTIC ACID, PLASMA
Lactic Acid, Venous: 2.2 mmol/L (ref 0.5–1.9)
Lactic Acid, Venous: 1.7 mmol/L (ref 0.5–1.9)

## 2023-01-15 MED ORDER — TIZANIDINE HCL 4 MG PO TABS
4.0000 mg | ORAL_TABLET | Freq: Once | ORAL | Status: AC
  Administered 2023-01-15: 4 mg via ORAL
  Filled 2023-01-15: qty 1

## 2023-01-15 MED ORDER — ALPRAZOLAM 0.5 MG PO TABS
1.0000 mg | ORAL_TABLET | Freq: Once | ORAL | Status: AC
  Administered 2023-01-15: 1 mg via ORAL
  Filled 2023-01-15: qty 2

## 2023-01-15 MED ORDER — LACTATED RINGERS IV BOLUS (SEPSIS)
1000.0000 mL | Freq: Once | INTRAVENOUS | Status: AC
  Administered 2023-01-15: 1000 mL via INTRAVENOUS

## 2023-01-15 MED ORDER — NOREPINEPHRINE 4 MG/250ML-% IV SOLN
0.0000 ug/min | INTRAVENOUS | Status: DC
  Administered 2023-01-16: 2 ug/min via INTRAVENOUS
  Filled 2023-01-15: qty 250

## 2023-01-15 MED ORDER — ACETAMINOPHEN 325 MG PO TABS
650.0000 mg | ORAL_TABLET | Freq: Once | ORAL | Status: AC
  Administered 2023-01-15: 650 mg via ORAL
  Filled 2023-01-15: qty 2

## 2023-01-15 MED ORDER — SODIUM CHLORIDE 0.9 % IV SOLN
500.0000 mg | INTRAVENOUS | Status: DC
  Administered 2023-01-15 – 2023-01-18 (×4): 500 mg via INTRAVENOUS
  Filled 2023-01-15 (×4): qty 5

## 2023-01-15 MED ORDER — SODIUM CHLORIDE 0.9 % IV SOLN
2.0000 g | INTRAVENOUS | Status: DC
  Administered 2023-01-15 – 2023-01-18 (×4): 2 g via INTRAVENOUS
  Filled 2023-01-15 (×4): qty 20

## 2023-01-15 MED ORDER — LACTATED RINGERS IV SOLN: INTRAVENOUS | Status: AC

## 2023-01-15 NOTE — Sepsis Progress Note (Signed)
Following for sepsis monitoring

## 2023-01-15 NOTE — ED Notes (Signed)
Pt sweating- fever breaking- rectal temp 99

## 2023-01-15 NOTE — ED Provider Notes (Signed)
[name] Provider Note   CSN: [id number] Arrival date & time: 01/15/23  1939     History  Chief Complaint  Patient presents with   Shortness of Breath    Krystal Burgess is a 62 y.o. female.  She has a history of COPD hypertension diabetes CKD.  She is on 2 L chronic oxygen increased to 4 with activity.  Former smoker.  Complaining of 1 week of cough productive of green sputum, pain in her chest, vomiting diarrhea dysuria.  Had a televisit with PCP yesterday prescribed Levaquin and prednisone but was unable to get.  Had more shortness of breath today and called 911.  Subjective fevers.  She also said she fell few days ago and injured her neck.  The history is provided by the patient.  Shortness of Breath Severity:  Moderate Onset quality:  Gradual Duration:  1 week Timing:  Constant Progression:  Worsening Chronicity:  Recurrent Relieved by:  Nothing Worsened by:  Activity and coughing Ineffective treatments:  Inhaler and oxygen Associated symptoms: chest pain, cough, fever, neck pain, sputum production and vomiting   Associated symptoms: no abdominal pain and no hemoptysis   Risk factors: obesity   Risk factors: no tobacco use        Home Medications Prior to Admission medications   Medication Sig Start Date End Date Taking? Authorizing Provider  albuterol (ACCUNEB) 1.25 MG/3ML nebulizer solution Take 3 mLs (1.25 mg total) by nebulization 3 (three) times daily as needed for wheezing. 06/27/22   Hali Marry, MD  albuterol (VENTOLIN HFA) 108 (90 Base) MCG/ACT inhaler INHALE 2 PUFFS INTO THE LUNGS EVERY 4 HOURS AS NEEDED FOR WHEEZING OR SHORTNESS OF BREATH. 04/03/21   Hali Marry, MD  ALPRAZolam Duanne Moron) 1 MG tablet Take 1 tablet (1 mg total) by mouth 2 (two) times daily as needed. for anxiety 01/01/23   Hali Marry, MD  AMBULATORY NON FORMULARY MEDICATION Medication Name: Portable battery-powered  nebulizer.Dx: COPD. [place] [phone]) 12/11/21   Hali Marry, MD  AMBULATORY NON FORMULARY MEDICATION Medication Name: Upright standing Rollator with seat. Dx: code M51.36 12/30/21   Hali Marry, MD  amLODipine (NORVASC) 5 MG tablet TAKE 1 TABLET (5 MG TOTAL) BY MOUTH DAILY. 01/13/22   Hali Marry, MD  B-D 3CC LUER-LOK SYR 25GX1" 25G X 1" 3 ML MISC Inject into the muscle once a week. 11/23/21   [provider]  diclofenac (VOLTAREN) 75 MG EC tablet Take 75 mg by mouth 2 (two) times daily. 11/23/21   [provider]  fluticasone (FLONASE) 50 MCG/ACT nasal spray SPRAY 2 SPRAYS INTO EACH NOSTRIL EVERY DAY 08/07/21   Hali Marry, MD  folic acid (FOLVITE) 1 MG tablet TAKE 1 TABLET BY MOUTH EVERY DAY 09/02/22   Hali Marry, MD  gabapentin (NEURONTIN) 600 MG tablet Take 1 tablet (600 mg total) by mouth 2 (two) times daily. Patient takes 1200 mg daily 06/02/22   Hali Marry, MD  ipratropium (ATROVENT) 0.02 % nebulizer solution Take 2.5 mLs (0.5 mg total) by nebulization 3 (three) times daily as needed for wheezing or shortness of breath. 06/27/22   Hali Marry, MD  ipratropium (ATROVENT) 0.06 % nasal spray Place 2 sprays into both nostrils 4 (four) times daily. 02/07/22   Silverio Decamp, MD  Iron-FA-B Cmp-C-Biot-Probiotic (FUSION PLUS) CAPS Take 1 capsule by mouth daily. 01/01/22   Hali Marry, MD  levocetirizine (XYZAL) 5 MG tablet TAKE 1 TABLET BY MOUTH EVERY DAY IN THE EVENING 09/02/22   Hali Marry, MD  levofloxacin (LEVAQUIN) 500 MG tablet Take 1 tablet (500 mg total) by mouth daily. 01/14/23   Donella Stade, PA-C  levothyroxine (SYNTHROID) 88 MCG tablet Take 1 tablet (88 mcg total) by mouth daily. 11/28/21   Hali Marry, MD  losartan (COZAAR) 100 MG tablet TAKE 1 TABLET (100 MG TOTAL) BY MOUTH DAILY. APPOINTMENT NEEDED FOR FURTHER REFILLS 10/24/22    Hali Marry, MD  Olopatadine HCl 0.2 % SOLN Apply 1 drop to eye daily. To each eye 07/15/22   Hali Marry, MD  ondansetron (ZOFRAN) 8 MG tablet Take 8 mg by mouth 3 (three) times daily. 03/03/22   [provider]  Oxycodone HCl 10 MG TABS Take 1 tablet (10 mg total) by mouth every 8 (eight) hours. Ok to take extra tab daily for the next 10 days after injury 12/16/22   Hali Marry, MD  pantoprazole (PROTONIX) 40 MG tablet TAKE 1 TABLET BY MOUTH EVERY DAY 11/05/22   Hali Marry, MD  predniSONE (DELTASONE) 20 MG tablet Take 3 tablets for 3 days, take 2 tablets for 3 days, take 1 tablet for 3 days, take 1/2 tablet for 4 days then transition to prevention dose. 01/14/23   Breeback, Jade L, PA-C  predniSONE (DELTASONE) 5 MG tablet TAKE 1/2 TABLET (2.5 MG TOTAL) BY MOUTH DAILY WITH BREAKFAST. 01/05/23   Hali Marry, MD  promethazine (PHENERGAN) 25 MG tablet TAKE 1 TABLET BY MOUTH 2 TIMES A DAY AS NEEDED FOR NAUSEA OR VOMITING 01/30/22   Hali Marry, MD  rosuvastatin (CRESTOR) 20 MG tablet TAKE 1 TABLET BY MOUTH EVERY DAY 09/26/22   Hali Marry, MD  sertraline (ZOLOFT) 100 MG tablet TAKE 2 TABLETS BY MOUTH EVERY DAY 10/29/21   Hali Marry, MD  theophylline (THEODUR) 300 MG 12 hr tablet TAKE 1 TABLET BY MOUTH TWICE A DAY 09/02/22   Hali Marry, MD  tiZANidine (ZANAFLEX) 4 MG tablet TAKE 1 TABLET BY MOUTH AT BEDTIME AS NEEDED FOR MUSCLE SPASMS. 11/24/22   Hali Marry, MD  traZODone (DESYREL) 100 MG tablet TAKE 1 TABLET BY MOUTH EVERYDAY AT BEDTIME 01/05/23   Hali Marry, MD  TRELEGY ELLIPTA 100-62.5-25 MCG/ACT AEPB TAKE 1 PUFF BY MOUTH EVERY DAY 07/28/22   Hali Marry, MD      Allergies    Risperidone and related, Aripiprazole, Fluoxetine hcl, Nsaids, Paroxetine, Ziprasidone hcl, Chantix [varenicline tartrate], Metformin and related, Montelukast, Onglyza [saxagliptin], Augmentin [amoxicillin-pot  clavulanate], and Fluoxetine hcl    Review of Systems   Review of Systems  Constitutional:  Positive for fever.  Respiratory:  Positive for cough, sputum production and shortness of breath. Negative for hemoptysis.   Cardiovascular:  Positive for chest pain.  Gastrointestinal:  Positive for diarrhea and vomiting. Negative for abdominal pain.  Genitourinary:  Positive for dysuria.  Musculoskeletal:  Positive for neck pain.    Physical Exam Updated Vital Signs BP (!) 106/50 (BP Location: Left Arm)   Pulse (!) 111   Temp (!) 104 F (40 C) (Rectal)   Resp (!) 21   Ht 5\' 4"  (1.626 m)   Wt 113 kg   SpO2 (!) 88%   BMI 42.76 kg/m  Physical Exam Vitals and nursing note reviewed.  Constitutional:      General: She is not in acute distress.    Appearance:  She is well-developed. She is obese. She is ill-appearing.  HENT:     Head: Normocephalic and atraumatic.  Eyes:     Conjunctiva/sclera: Conjunctivae normal.  Cardiovascular:     Rate and Rhythm: Regular rhythm. Tachycardia present.     Heart sounds: No murmur heard. Pulmonary:     Effort: Pulmonary effort is normal. No respiratory distress.     Breath sounds: Normal breath sounds.  Abdominal:     Palpations: Abdomen is soft.     Tenderness: There is no abdominal tenderness. There is no guarding or rebound.  Musculoskeletal:        General: No swelling.     Cervical back: Neck supple.     Right lower leg: No tenderness.     Left lower leg: No tenderness.  Skin:    General: Skin is warm and dry.     Capillary Refill: Capillary refill takes less than 2 seconds.  Neurological:     General: No focal deficit present.     Mental Status: She is alert.     ED Results / Procedures / Treatments   Labs (all labs ordered are listed, but only abnormal results are displayed) Labs Reviewed  MRSA NEXT GEN BY PCR, NASAL - Abnormal; Notable for the following components:      Result Value   MRSA by PCR Next Gen DETECTED (*)    All  other components within normal limits  LACTIC ACID, PLASMA - Abnormal; Notable for the following components:   Lactic Acid, Venous 2.2 (*)    All other components within normal limits  COMPREHENSIVE METABOLIC PANEL - Abnormal; Notable for the following components:   Sodium 132 (*)    CO2 21 (*)    Glucose, Bld 108 (*)    BUN 24 (*)    Creatinine, Ser 3.86 (*)    Calcium 8.6 (*)    Albumin 3.0 (*)    AST 67 (*)    GFR, Estimated 13 (*)    All other components within normal limits  CBC WITH DIFFERENTIAL/PLATELET - Abnormal; Notable for the following components:   WBC 19.3 (*)    RBC 2.90 (*)    Hemoglobin 8.6 (*)    HCT 28.1 (*)    Neutro Abs 16.1 (*)    Monocytes Absolute 1.8 (*)    Abs Immature Granulocytes 0.29 (*)    All other components within normal limits  PROTIME-INR - Abnormal; Notable for the following components:   Prothrombin Time 16.4 (*)    INR 1.3 (*)    All other components within normal limits  CBC - Abnormal; Notable for the following components:   WBC 21.9 (*)    RBC 2.99 (*)    Hemoglobin 8.8 (*)    HCT 28.7 (*)    All other components within normal limits  COMPREHENSIVE METABOLIC PANEL - Abnormal; Notable for the following components:   Glucose, Bld 125 (*)    BUN 24 (*)    Creatinine, Ser 3.55 (*)    Calcium 8.7 (*)    Albumin 2.7 (*)    AST 69 (*)    GFR, Estimated 14 (*)    All other components within normal limits  GLUCOSE, CAPILLARY - Abnormal; Notable for the following components:   Glucose-Capillary 121 (*)    All other components within normal limits  TROPONIN I (HIGH SENSITIVITY) - Abnormal; Notable for the following components:   Troponin I (High Sensitivity) 18 (*)    All other components within  normal limits  RESP PANEL BY RT-PCR (RSV, FLU A&B, COVID)  RVPGX2  CULTURE, BLOOD (ROUTINE X 2)  CULTURE, BLOOD (ROUTINE X 2)  EXPECTORATED SPUTUM ASSESSMENT W GRAM STAIN, RFLX TO RESP C  LACTIC ACID, PLASMA  APTT  BRAIN NATRIURETIC PEPTIDE   PROCALCITONIN  HEMOGLOBIN A1C  HIV ANTIBODY (ROUTINE TESTING W REFLEX)  MAGNESIUM  PHOSPHORUS  URINALYSIS, W/ REFLEX TO CULTURE (INFECTION SUSPECTED)  STREP PNEUMONIAE URINARY ANTIGEN  TROPONIN I (HIGH SENSITIVITY)    EKG EKG Interpretation  Date/Time:  Thursday January 15 2023 19:48:15 EST Ventricular Rate:  111 PR Interval:  150 QRS Duration: 85 QT Interval:  294 QTC Calculation: 400 R Axis:   37 Text Interpretation: Sinus tachycardia Low voltage, precordial leads No significant change since prior 3/22 Confirmed by Meridee Score [phone]) on 01/15/2023 8:01:12 PM  Radiology DG Chest Port 1 View  Result Date: 01/15/2023 CLINICAL DATA:  Questionable sepsis. EXAM: PORTABLE CHEST 1 VIEW COMPARISON:  Chest radiograph and CT dated 03/02/2021. FINDINGS: Scatter pulmonary nodules as seen on the prior CT most consistent with multi lobar pneumonia, possibly atypical in etiology. Clinical correlation and follow-up to resolution recommended. There is slight blunting of the left costophrenic angle which may represent scarring or trace pleural effusion. No pneumothorax. The cardiac silhouette is within normal limits. No acute osseous pathology. IMPRESSION: Multi lobar pneumonia. Clinical correlation and follow-up to resolution recommended. Electronically Signed   By: Elgie Collard M.D.   On: 01/15/2023 20:30    Procedures .Critical Care  Performed by: Terrilee Files, MD Authorized by: Terrilee Files, MD   Critical care provider statement:    Critical care time (minutes):  45   Critical care time was exclusive of:  Separately billable procedures and treating other patients   Critical care was necessary to treat or prevent imminent or life-threatening deterioration of the following conditions:  Sepsis, respiratory failure and renal failure   Critical care was time spent personally by me on the following activities:  Development of treatment plan with patient or surrogate, discussions  with consultants, evaluation of patient's response to treatment, examination of patient, obtaining history from patient or surrogate, ordering and performing treatments and interventions, ordering and review of laboratory studies, ordering and review of radiographic studies, pulse oximetry, re-evaluation of patient's condition and review of old charts   I assumed direction of critical care for this patient from another provider in my specialty: no       Medications Ordered in ED Medications  lactated ringers infusion ( Intravenous New Bag/Given 01/15/23 2004)  cefTRIAXone (ROCEPHIN) 2 g in sodium chloride 0.9 % 100 mL IVPB (0 g Intravenous Stopped 01/15/23 2118)  azithromycin (ZITHROMAX) 500 mg in sodium chloride 0.9 % 250 mL IVPB (0 mg Intravenous Stopped 01/15/23 2306)  norepinephrine (LEVOPHED) 4mg  in (0.016 mg/mL) premix infusion (2 mcg/min Intravenous New Bag/Given 01/16/23 0021)  lactated ringers bolus 1,000 mL (0 mLs Intravenous Stopped 01/15/23 2118)  tiZANidine (ZANAFLEX) tablet 4 mg (4 mg Oral Given 01/15/23 2036)  ALPRAZolam (XANAX) tablet 1 mg (1 mg Oral Given 01/15/23 2035)  acetaminophen (TYLENOL) tablet 650 mg (650 mg Oral Given 01/15/23 2036)  lactated ringers bolus 1,000 mL (0 mLs Intravenous Stopped 01/15/23 2305)    ED Course/ Medical Decision Making/ A&P Clinical Course as of 01/16/23 0039  Thu Jan 15, 2023  2038 Chest x-ray interpreted by me as likely left upper lobe right lower lobe infiltrates.  Awaiting radiology reading. [MB]    Clinical Course User  Index [MB] Terrilee Files, MD                             Medical Decision Making Amount and/or Complexity of Data Reviewed Labs: ordered. Radiology: ordered.  Risk OTC drugs. Prescription drug management. Decision regarding hospitalization.   This patient complains of cough fever increased work of breathing; this involves an extensive number of treatment Options and is a complaint that carries with it a  high risk of complications and morbidity. The differential includes sepsis, Sirs, pneumonia, COVID, flu, COPD exacerbation, anemia, ACS, PE  I ordered, reviewed and interpreted labs, which included CBC with elevated white count hemoglobin low, chemistries with worsening AKI, lactate elevated and cleared, COVID and flu negative, troponins mildly elevated I ordered medication IV fluids IV antibiotics IV pressors and reviewed PMP when indicated. I ordered imaging studies which included chest x-ray and I independently    visualized and interpreted imaging which showed multifocal pneumonia Additional history obtained from EMS Previous records obtained and reviewed discharge summary from last admission in December I consulted critical care and discussed lab and imaging findings and discussed disposition.  Cardiac monitoring reviewed, normal sinus rhythm Social determinants considered, multiple barriers including poor physical level of activity, depression Critical Interventions: Initiation of goal-directed therapy for sepsis, initiation of pressors for hypotension  After the interventions stated above, I reevaluated the patient and found patient's blood pressure remained soft although stable on nasal cannula oxygen Admission and further testing considered, she will need admission to the hospital for further management.  I signed out to Dr. Judd Lien to follow-up on recommendation from critical care and for discussion with hospitalist here regarding admission.         Final Clinical Impression(s) / ED Diagnoses Final diagnoses:  Nonspecific chest pain  Septic shock (HCC)  Acute respiratory failure with hypoxia (HCC)  Multifocal pneumonia  AKI (acute kidney injury) (HCC)    Rx / DC Orders ED Discharge Orders     None         Terrilee Files, MD 01/16/23 1031

## 2023-01-15 NOTE — ED Triage Notes (Signed)
Pt to ed via [facility]. Pt recently dx with pneumonia. Pt states unable to take antiobiotics since dx. Pt normally wears 2-4Lnc at home. Pt O2 at home was 70s-80s. On 6Lnc, pt came up to 90s. Pt sat at 85% on 2L [place].   18G L Fore with 559ml NS  101.3F oral 167CBG 88/56 BP

## 2023-01-15 NOTE — ED Notes (Signed)
Dr Melina Copa made aware of pt low BP- new orders received

## 2023-01-15 NOTE — ED Notes (Signed)
Blood cultures drawn x 2 by lab 

## 2023-01-16 DIAGNOSIS — J9621 Acute and chronic respiratory failure with hypoxia: Secondary | ICD-10-CM | POA: Diagnosis not present

## 2023-01-16 DIAGNOSIS — Z8249 Family history of ischemic heart disease and other diseases of the circulatory system: Secondary | ICD-10-CM | POA: Diagnosis not present

## 2023-01-16 DIAGNOSIS — E876 Hypokalemia: Secondary | ICD-10-CM | POA: Diagnosis present

## 2023-01-16 DIAGNOSIS — E440 Moderate protein-calorie malnutrition: Secondary | ICD-10-CM | POA: Diagnosis present

## 2023-01-16 DIAGNOSIS — N185 Chronic kidney disease, stage 5: Secondary | ICD-10-CM | POA: Diagnosis present

## 2023-01-16 DIAGNOSIS — E782 Mixed hyperlipidemia: Secondary | ICD-10-CM | POA: Diagnosis not present

## 2023-01-16 DIAGNOSIS — Z87891 Personal history of nicotine dependence: Secondary | ICD-10-CM | POA: Diagnosis not present

## 2023-01-16 DIAGNOSIS — N189 Chronic kidney disease, unspecified: Secondary | ICD-10-CM | POA: Diagnosis not present

## 2023-01-16 DIAGNOSIS — Z9981 Dependence on supplemental oxygen: Secondary | ICD-10-CM | POA: Diagnosis not present

## 2023-01-16 DIAGNOSIS — I10 Essential (primary) hypertension: Secondary | ICD-10-CM

## 2023-01-16 DIAGNOSIS — Z1152 Encounter for screening for COVID-19: Secondary | ICD-10-CM | POA: Diagnosis not present

## 2023-01-16 DIAGNOSIS — D649 Anemia, unspecified: Secondary | ICD-10-CM | POA: Diagnosis present

## 2023-01-16 DIAGNOSIS — A419 Sepsis, unspecified organism: Secondary | ICD-10-CM | POA: Diagnosis not present

## 2023-01-16 DIAGNOSIS — J189 Pneumonia, unspecified organism: Secondary | ICD-10-CM | POA: Diagnosis present

## 2023-01-16 DIAGNOSIS — E871 Hypo-osmolality and hyponatremia: Secondary | ICD-10-CM | POA: Diagnosis present

## 2023-01-16 DIAGNOSIS — J449 Chronic obstructive pulmonary disease, unspecified: Secondary | ICD-10-CM

## 2023-01-16 DIAGNOSIS — R7989 Other specified abnormal findings of blood chemistry: Secondary | ICD-10-CM | POA: Diagnosis not present

## 2023-01-16 DIAGNOSIS — R6521 Severe sepsis with septic shock: Secondary | ICD-10-CM | POA: Diagnosis present

## 2023-01-16 DIAGNOSIS — E1122 Type 2 diabetes mellitus with diabetic chronic kidney disease: Secondary | ICD-10-CM | POA: Diagnosis present

## 2023-01-16 DIAGNOSIS — E46 Unspecified protein-calorie malnutrition: Secondary | ICD-10-CM

## 2023-01-16 DIAGNOSIS — J440 Chronic obstructive pulmonary disease with acute lower respiratory infection: Secondary | ICD-10-CM | POA: Diagnosis present

## 2023-01-16 DIAGNOSIS — E872 Acidosis, unspecified: Secondary | ICD-10-CM | POA: Insufficient documentation

## 2023-01-16 DIAGNOSIS — N179 Acute kidney failure, unspecified: Secondary | ICD-10-CM | POA: Diagnosis not present

## 2023-01-16 DIAGNOSIS — Z6841 Body Mass Index (BMI) 40.0 and over, adult: Secondary | ICD-10-CM | POA: Diagnosis not present

## 2023-01-16 DIAGNOSIS — I120 Hypertensive chronic kidney disease with stage 5 chronic kidney disease or end stage renal disease: Secondary | ICD-10-CM | POA: Diagnosis present

## 2023-01-16 DIAGNOSIS — E8809 Other disorders of plasma-protein metabolism, not elsewhere classified: Secondary | ICD-10-CM | POA: Diagnosis not present

## 2023-01-16 DIAGNOSIS — I2489 Other forms of acute ischemic heart disease: Secondary | ICD-10-CM | POA: Diagnosis present

## 2023-01-16 LAB — COMPREHENSIVE METABOLIC PANEL
ALT: 26 U/L (ref 0–44)
AST: 69 U/L — ABNORMAL HIGH (ref 15–41)
Albumin: 2.7 g/dL — ABNORMAL LOW (ref 3.5–5.0)
Alkaline Phosphatase: 86 U/L (ref 38–126)
Anion gap: 11 (ref 5–15)
BUN: 24 mg/dL — ABNORMAL HIGH (ref 6–20)
CO2: 26 mmol/L (ref 22–32)
Calcium: 8.7 mg/dL — ABNORMAL LOW (ref 8.9–10.3)
Chloride: 99 mmol/L (ref 98–111)
Creatinine, Ser: 3.55 mg/dL — ABNORMAL HIGH (ref 0.44–1.00)
GFR, Estimated: 14 mL/min — ABNORMAL LOW (ref >60)
Glucose, Bld: 125 mg/dL — ABNORMAL HIGH (ref 70–99)
Potassium: 4.6 mmol/L (ref 3.5–5.1)
Sodium: 136 mmol/L (ref 135–145)
Total Bilirubin: 0.4 mg/dL (ref 0.3–1.2)
Total Protein: 6.9 g/dL (ref 6.5–8.1)

## 2023-01-16 LAB — CBC
HCT: 28.7 % — ABNORMAL LOW (ref 36.0–46.0)
Hemoglobin: 8.8 g/dL — ABNORMAL LOW (ref 12.0–15.0)
MCH: 29.4 pg (ref 26.0–34.0)
MCHC: 30.7 g/dL (ref 30.0–36.0)
MCV: 96 fL (ref 80.0–100.0)
Platelets: 286 10*3/uL (ref 150–400)
RBC: 2.99 MIL/uL — ABNORMAL LOW (ref 3.87–5.11)
RDW: 14.6 % (ref 11.5–15.5)
WBC: 21.9 10*3/uL — ABNORMAL HIGH (ref 4.0–10.5)
nRBC: 0 % (ref 0.0–0.2)

## 2023-01-16 LAB — GLUCOSE, CAPILLARY: Glucose-Capillary: 121 mg/dL — ABNORMAL HIGH (ref 70–99)

## 2023-01-16 LAB — HIV ANTIBODY (ROUTINE TESTING W REFLEX): HIV Screen 4th Generation wRfx: NONREACTIVE

## 2023-01-16 LAB — MRSA NEXT GEN BY PCR, NASAL: MRSA by PCR Next Gen: DETECTED — AB

## 2023-01-16 LAB — MAGNESIUM: Magnesium: 1.7 mg/dL (ref 1.7–2.4)

## 2023-01-16 LAB — HEMOGLOBIN A1C
Hgb A1c MFr Bld: 5.5 % (ref 4.8–5.6)
Mean Plasma Glucose: 111.15 mg/dL

## 2023-01-16 LAB — PHOSPHORUS: Phosphorus: 3.8 mg/dL (ref 2.5–4.6)

## 2023-01-16 LAB — PROCALCITONIN: Procalcitonin: 6.43 ng/mL

## 2023-01-16 MED ORDER — SODIUM CHLORIDE 0.9 % IV SOLN
250.0000 mL | INTRAVENOUS | Status: DC
  Administered 2023-01-16: 250 mL via INTRAVENOUS

## 2023-01-16 MED ORDER — CHLORHEXIDINE GLUCONATE CLOTH 2 % EX PADS
6.0000 | MEDICATED_PAD | Freq: Every day | CUTANEOUS | Status: DC
  Administered 2023-01-16 – 2023-01-20 (×5): 6 via TOPICAL

## 2023-01-16 MED ORDER — TRAZODONE HCL 50 MG PO TABS
100.0000 mg | ORAL_TABLET | Freq: Every evening | ORAL | Status: DC | PRN
  Administered 2023-01-16 – 2023-01-19 (×4): 100 mg via ORAL
  Filled 2023-01-16 (×4): qty 2

## 2023-01-16 MED ORDER — OXYCODONE HCL 5 MG PO TABS
10.0000 mg | ORAL_TABLET | Freq: Three times a day (TID) | ORAL | Status: DC | PRN
  Administered 2023-01-16 – 2023-01-20 (×10): 10 mg via ORAL
  Filled 2023-01-16 (×10): qty 2

## 2023-01-16 MED ORDER — ALBUTEROL SULFATE HFA 108 (90 BASE) MCG/ACT IN AERS: 2.0000 | INHALATION_SPRAY | RESPIRATORY_TRACT | Status: DC | PRN

## 2023-01-16 MED ORDER — MIDODRINE HCL 5 MG PO TABS
10.0000 mg | ORAL_TABLET | Freq: Three times a day (TID) | ORAL | Status: DC
  Administered 2023-01-16 – 2023-01-20 (×13): 10 mg via ORAL
  Filled 2023-01-16 (×13): qty 2

## 2023-01-16 MED ORDER — ONDANSETRON HCL 4 MG PO TABS
4.0000 mg | ORAL_TABLET | Freq: Four times a day (QID) | ORAL | Status: DC | PRN
  Administered 2023-01-18: 4 mg via ORAL
  Filled 2023-01-16: qty 1

## 2023-01-16 MED ORDER — IPRATROPIUM-ALBUTEROL 0.5-2.5 (3) MG/3ML IN SOLN
3.0000 mL | RESPIRATORY_TRACT | Status: DC | PRN
  Administered 2023-01-16 – 2023-01-20 (×13): 3 mL via RESPIRATORY_TRACT
  Filled 2023-01-16 (×13): qty 3

## 2023-01-16 MED ORDER — ACETAMINOPHEN 650 MG RE SUPP: 650.0000 mg | Freq: Four times a day (QID) | RECTAL | Status: DC | PRN

## 2023-01-16 MED ORDER — GUAIFENESIN-DM 100-10 MG/5ML PO SYRP
5.0000 mL | ORAL_SOLUTION | ORAL | Status: DC | PRN
  Administered 2023-01-16 – 2023-01-18 (×4): 5 mL via ORAL
  Filled 2023-01-16 (×4): qty 5

## 2023-01-16 MED ORDER — SERTRALINE HCL 50 MG PO TABS
200.0000 mg | ORAL_TABLET | Freq: Every day | ORAL | Status: DC
  Administered 2023-01-16 – 2023-01-20 (×5): 200 mg via ORAL
  Filled 2023-01-16 (×5): qty 4

## 2023-01-16 MED ORDER — ONDANSETRON HCL 4 MG/2ML IJ SOLN
4.0000 mg | Freq: Four times a day (QID) | INTRAMUSCULAR | Status: DC | PRN
  Administered 2023-01-16 – 2023-01-17 (×2): 4 mg via INTRAVENOUS
  Filled 2023-01-16 (×3): qty 2

## 2023-01-16 MED ORDER — TIZANIDINE HCL 4 MG PO TABS
4.0000 mg | ORAL_TABLET | Freq: Every evening | ORAL | Status: DC | PRN
  Administered 2023-01-17 – 2023-01-19 (×2): 4 mg via ORAL
  Filled 2023-01-16: qty 2
  Filled 2023-01-16 (×2): qty 1

## 2023-01-16 MED ORDER — FLUTICASONE FUROATE-VILANTEROL 100-25 MCG/ACT IN AEPB
1.0000 | INHALATION_SPRAY | Freq: Every day | RESPIRATORY_TRACT | Status: DC
  Administered 2023-01-16 – 2023-01-20 (×5): 1 via RESPIRATORY_TRACT
  Filled 2023-01-16: qty 28

## 2023-01-16 MED ORDER — ALPRAZOLAM 1 MG PO TABS
1.0000 mg | ORAL_TABLET | Freq: Two times a day (BID) | ORAL | Status: DC | PRN
  Administered 2023-01-16 – 2023-01-20 (×5): 1 mg via ORAL
  Filled 2023-01-16 (×4): qty 1
  Filled 2023-01-16: qty 2

## 2023-01-16 MED ORDER — DM-GUAIFENESIN ER 30-600 MG PO TB12
1.0000 | ORAL_TABLET | Freq: Two times a day (BID) | ORAL | Status: DC
  Administered 2023-01-16 – 2023-01-20 (×9): 1 via ORAL
  Filled 2023-01-16 (×9): qty 1

## 2023-01-16 MED ORDER — FOLIC ACID 1 MG PO TABS
1.0000 mg | ORAL_TABLET | Freq: Every day | ORAL | Status: DC
  Administered 2023-01-16 – 2023-01-20 (×5): 1 mg via ORAL
  Filled 2023-01-16 (×5): qty 1

## 2023-01-16 MED ORDER — NOREPINEPHRINE 4 MG/250ML-% IV SOLN
2.0000 ug/min | INTRAVENOUS | Status: DC
  Administered 2023-01-16: 2 ug/min via INTRAVENOUS
  Administered 2023-01-16: 4 ug/min via INTRAVENOUS

## 2023-01-16 MED ORDER — PANTOPRAZOLE SODIUM 40 MG PO TBEC
40.0000 mg | DELAYED_RELEASE_TABLET | Freq: Every day | ORAL | Status: DC
  Administered 2023-01-16 – 2023-01-20 (×5): 40 mg via ORAL
  Filled 2023-01-16 (×6): qty 1

## 2023-01-16 MED ORDER — HEPARIN SODIUM (PORCINE) 5000 UNIT/ML IJ SOLN
5000.0000 [IU] | Freq: Three times a day (TID) | INTRAMUSCULAR | Status: DC
  Administered 2023-01-16 – 2023-01-20 (×13): 5000 [IU] via SUBCUTANEOUS
  Filled 2023-01-16 (×13): qty 1

## 2023-01-16 MED ORDER — ROSUVASTATIN CALCIUM 20 MG PO TABS
20.0000 mg | ORAL_TABLET | Freq: Every day | ORAL | Status: DC
  Administered 2023-01-16 – 2023-01-20 (×5): 20 mg via ORAL
  Filled 2023-01-16 (×5): qty 1

## 2023-01-16 MED ORDER — ACETAMINOPHEN 325 MG PO TABS
650.0000 mg | ORAL_TABLET | Freq: Four times a day (QID) | ORAL | Status: DC | PRN
  Administered 2023-01-16 – 2023-01-19 (×3): 650 mg via ORAL
  Filled 2023-01-16 (×3): qty 2

## 2023-01-16 MED ORDER — LEVOTHYROXINE SODIUM 88 MCG PO TABS
88.0000 ug | ORAL_TABLET | Freq: Every day | ORAL | Status: DC
  Administered 2023-01-17 – 2023-01-20 (×4): 88 ug via ORAL
  Filled 2023-01-16 (×4): qty 1

## 2023-01-16 MED ORDER — UMECLIDINIUM BROMIDE 62.5 MCG/ACT IN AEPB
1.0000 | INHALATION_SPRAY | Freq: Every day | RESPIRATORY_TRACT | Status: DC
  Administered 2023-01-16 – 2023-01-20 (×5): 1 via RESPIRATORY_TRACT
  Filled 2023-01-16: qty 7

## 2023-01-16 MED ORDER — ENSURE ENLIVE PO LIQD
237.0000 mL | Freq: Two times a day (BID) | ORAL | Status: DC
  Administered 2023-01-16 – 2023-01-20 (×7): 237 mL via ORAL
  Filled 2023-01-16 (×2): qty 237

## 2023-01-16 NOTE — Progress Notes (Signed)
PROGRESS NOTE    Krystal Burgess  SEG:[id number] DOB: 1961-10-17 DOA: 01/15/2023 PCP: Agapito Games, MD   Brief Narrative:  Krystal Burgess is a 62 y.o. female with medical history significant of COPD on supplemental oxygen at 2 LPM, hypertension, type 2 diabetes mellitus, OSA who presents to the emergency department from home via EMS due to 1 week onset of productive cough of greenish sputum.  She was admitted for sepsis secondary to acute on chronic hypoxemic respiratory failure in the setting of community-acquired pneumonia.  Chest x-ray with multilobar pneumonia noted and she was previously admitted last month with pneumonia issues as well.  She is also noted to have AKI on CKD stage V.  She continues to have some hypotension requiring norepinephrine.   Assessment & Plan:   Principal Problem:   Sepsis due to pneumonia [facility]) Active Problems:   Mixed hyperlipidemia   Essential hypertension   Type 2 diabetes mellitus (HCC)   COPD (chronic obstructive pulmonary disease) (HCC)   Obesity, Class III, BMI 40-49.9 (morbid obesity) (HCC)   Acute on chronic respiratory failure with hypoxia (HCC)   Acute kidney injury superimposed on chronic kidney disease (HCC)   Lactic acidosis   Elevated troponin   Hypoalbuminemia due to protein-calorie malnutrition (HCC)  Assessment and Plan:  Sepsis secondary to acute on chronic respiratory failure with hypoxia secondary to CAP POA Patient met sepsis criteria due to leukocytosis, fever, tachycardia and tachypnea (met SIRS criteria) with source of infection being the lungs due to multilobar pneumonia Chest x-ray was suggestive of multilobar pneumonia Patient was started on ceftriaxone and azithromycin, we shall continue same at this time with plan to de-escalate/discontinue based on blood culture, sputum culture, urine Legionella, strep pneumo and procalcitonin Continue Tylenol as needed Continue Mucinex, incentive spirometry, flutter valve   Wears 2 L at baseline at home Hypotension likely related to this, wean norepinephrine as tolerated and start midodrine to help that process   Acute on chronic respiratory failure with hypoxia in the setting of above Supplemental oxygen was increased to 5 LPM to obtain O2 sat of 97 to 98% Continue treatment as described above Wears 2 L nasal cannula at rest at home   Lactic acidosis-resolved   Hypoalbuminemia secondary to moderate protein calorie malnutrition Albumin 3.0, protein supplement will be provided   Acute kidney injury superimposed on CKD stage V-improving BUN/creatinine 24/3.86 (baseline creatinine at 1.3-1.4) Continue IV hydration Renally adjust medications, avoid nephrotoxic agents/dehydration/hypotension   Elevated troponin possibly secondary to type II demand ischemia Troponin 18 > 14; troponin is flattened out   COPD (not in acute exacerbation) Continue albuterol, DuoNeb, Breo and Incruse elliptica   Essential hypertension BP meds will be held at this time due to soft BP   Mixed hyperlipidemia Statin will be held at this time due to elevated AST   Type 2 diabetes mellitus Patient was not on any T2DM medication per med rec Hemoglobin A1c will be checked   Obesity class III (BMI 42.76) Continue lifestyle and diet modification  Chronic pain secondary to low back pain/DJD Continue home medications    DVT prophylaxis: Heparin Code Status: Full Family Communication: None at bedside Disposition Plan:  Status is: Inpatient Remains inpatient appropriate because: Need for IV medications.   Consultants:  None  Procedures:  None  Antimicrobials:  Anti-infectives (From admission, onward)    Start     Dose/Rate Route Frequency Ordered Stop   01/15/23 2000  cefTRIAXone (ROCEPHIN) 2 g in sodium chloride  0.9 % 100 mL IVPB        2 g 200 mL/hr over 30 Minutes Intravenous Every 24 hours 01/15/23 1955 01/20/23 1959   01/15/23 2000  azithromycin (ZITHROMAX)  500 mg in sodium chloride 0.9 % 250 mL IVPB        500 mg 250 mL/hr over 60 Minutes Intravenous Every 24 hours 01/15/23 1955 01/20/23 1959       Subjective: Patient seen and evaluated today with complaints of ongoing low back pain.  Objective: Vitals:   01/16/23 0432 01/16/23 0545 01/16/23 0600 01/16/23 0615  BP:  118/73 (!) 79/43 101/69  Pulse:  84 79 74  Resp:  20 15 15   Temp:      TempSrc:      SpO2: 100% 98% 97% 100%  Weight:      Height:        Intake/Output Summary (Last 24 hours) at 01/16/2023 0809 Last data filed at 01/16/2023 0427 Gross per 24 hour  Intake 4113.53 ml  Output --  Net 4113.53 ml   Filed Weights   01/15/23 1951  Weight: 113 kg    Examination:  General exam: Appears calm and comfortable, morbidly obese Respiratory system: Clear to auscultation. Respiratory effort normal.  3-4 L nasal cannula oxygen Cardiovascular system: S1 & S2 heard, RRR.  Gastrointestinal system: Abdomen is soft Central nervous system: Alert and awake Extremities: No edema Skin: No significant lesions noted Psychiatry: Flat affect.    Data Reviewed: I have personally reviewed following labs and imaging studies  CBC: Recent Labs  Lab 01/15/23 2030 01/16/23 0430  WBC 19.3* 21.9*  NEUTROABS 16.1*  --   HGB 8.6* 8.8*  HCT 28.1* 28.7*  MCV 96.9 96.0  PLT 292 286   Basic Metabolic Panel: Recent Labs  Lab 01/15/23 2030 01/16/23 0430  NA 132* 136  K 4.3 4.6  CL 98 99  CO2 21* 26  GLUCOSE 108* 125*  BUN 24* 24*  CREATININE 3.86* 3.55*  CALCIUM 8.6* 8.7*  MG  --  1.7  PHOS  --  3.8   GFR: Estimated Creatinine Clearance: 21 mL/min (A) (by C-G formula based on SCr of 3.55 mg/dL (H)). Liver Function Tests: Recent Labs  Lab 01/15/23 2030 01/16/23 0430  AST 67* 69*  ALT 26 26  ALKPHOS 94 86  BILITOT 0.6 0.4  PROT 7.2 6.9  ALBUMIN 3.0* 2.7*   No results for input(s): "LIPASE", "AMYLASE" in the last 168 hours. No results for input(s): "AMMONIA" in the  last 168 hours. Coagulation Profile: Recent Labs  Lab 01/15/23 2030  INR 1.3*   Cardiac Enzymes: No results for input(s): "CKTOTAL", "CKMB", "CKMBINDEX", "TROPONINI" in the last 168 hours. BNP (last 3 results) No results for input(s): "PROBNP" in the last 8760 hours. HbA1C: No results for input(s): "HGBA1C" in the last 72 hours. CBG: Recent Labs  Lab 01/16/23 0417  GLUCAP 121*   Lipid Profile: No results for input(s): "CHOL", "HDL", "LDLCALC", "TRIG", "CHOLHDL", "LDLDIRECT" in the last 72 hours. Thyroid Function Tests: No results for input(s): "TSH", "T4TOTAL", "FREET4", "T3FREE", "THYROIDAB" in the last 72 hours. Anemia Panel: No results for input(s): "VITAMINB12", "FOLATE", "FERRITIN", "TIBC", "IRON", "RETICCTPCT" in the last 72 hours. Sepsis Labs: Recent Labs  Lab 01/15/23 2030 01/15/23 2216  PROCALCITON  --  6.43  LATICACIDVEN 2.2* 1.7    Recent Results (from the past 240 hour(s))  Resp panel by RT-PCR (RSV, Flu A&B, Covid) Anterior Nasal Swab     Status: None  Collection Time: 01/15/23  8:09 PM   Specimen: Anterior Nasal Swab  Result Value Ref Range Status   SARS Coronavirus 2 by RT PCR NEGATIVE NEGATIVE Final    Comment: (NOTE) SARS-CoV-2 target nucleic acids are NOT DETECTED.  The SARS-CoV-2 RNA is generally detectable in upper respiratory specimens during the acute phase of infection. The lowest concentration of SARS-CoV-2 viral copies this assay can detect is 138 copies/mL. A negative result does not preclude SARS-Cov-2 infection and should not be used as the sole basis for treatment or other patient management decisions. A negative result may occur with  improper specimen collection/handling, submission of specimen other than nasopharyngeal swab, presence of viral mutation(s) within the areas targeted by this assay, and inadequate number of viral copies(<138 copies/mL). A negative result must be combined with clinical observations, patient history, and  epidemiological information. The expected result is Negative.  Fact Sheet for Patients:  EntrepreneurPulse.com.au  Fact Sheet for Healthcare Providers:  IncredibleEmployment.be  This test is no t yet approved or cleared by the [place] FDA and  has been authorized for detection and/or diagnosis of SARS-CoV-2 by FDA under an Emergency Use Authorization (EUA). This EUA will remain  in effect (meaning this test can be used) for the duration of the COVID-19 declaration under Section 564(b)(1) of the Act, 21 U.S.C.section 360bbb-3(b)(1), unless the authorization is terminated  or revoked sooner.       Influenza A by PCR NEGATIVE NEGATIVE Final   Influenza B by PCR NEGATIVE NEGATIVE Final    Comment: (NOTE) The Xpert Xpress SARS-CoV-2/FLU/RSV plus assay is intended as an aid in the diagnosis of influenza from Nasopharyngeal swab specimens and should not be used as a sole basis for treatment. Nasal washings and aspirates are unacceptable for Xpert Xpress SARS-CoV-2/FLU/RSV testing.  Fact Sheet for Patients: EntrepreneurPulse.com.au  Fact Sheet for Healthcare Providers: IncredibleEmployment.be  This test is not yet approved or cleared by the [place] FDA and has been authorized for detection and/or diagnosis of SARS-CoV-2 by FDA under an Emergency Use Authorization (EUA). This EUA will remain in effect (meaning this test can be used) for the duration of the COVID-19 declaration under Section 564(b)(1) of the Act, 21 U.S.C. section 360bbb-3(b)(1), unless the authorization is terminated or revoked.     Resp Syncytial Virus by PCR NEGATIVE NEGATIVE Final    Comment: (NOTE) Fact Sheet for Patients: EntrepreneurPulse.com.au  Fact Sheet for Healthcare Providers: IncredibleEmployment.be  This test is not yet approved or cleared by the [place] FDA and has been  authorized for detection and/or diagnosis of SARS-CoV-2 by FDA under an Emergency Use Authorization (EUA). This EUA will remain in effect (meaning this test can be used) for the duration of the COVID-19 declaration under Section 564(b)(1) of the Act, 21 U.S.C. section 360bbb-3(b)(1), unless the authorization is terminated or revoked.  Performed at [facility], [address], [place], [place] [address]   Blood Culture (routine x 2)     Status: None (Preliminary result)   Collection Time: 01/15/23  8:16 PM   Specimen: BLOOD  Result Value Ref Range Status   Specimen Description BLOOD BLOOD RIGHT HAND  Final   Special Requests   Final    BOTTLES DRAWN AEROBIC ONLY Blood Culture results may not be optimal due to an inadequate volume of blood received in culture bottles   Culture   Final    NO GROWTH < 12 HOURS Performed at [facility], [address] [place], [place] [address]  Report Status PENDING  Incomplete  Blood Culture (routine x 2)     Status: None (Preliminary result)   Collection Time: 01/15/23  8:30 PM   Specimen: BLOOD  Result Value Ref Range Status   Specimen Description BLOOD BLOOD LEFT HAND  Final   Special Requests   Final    BOTTLES DRAWN AEROBIC AND ANAEROBIC Blood Culture adequate volume   Culture   Final    NO GROWTH < 12 HOURS Performed at [facility], [address], [place], [place] [address]    Report Status PENDING  Incomplete         Radiology Studies: DG Chest Port 1 View  Result Date: 01/15/2023 CLINICAL DATA:  Questionable sepsis. EXAM: PORTABLE CHEST 1 VIEW COMPARISON:  Chest radiograph and CT dated 03/02/2021. FINDINGS: Scatter pulmonary nodules as seen on the prior CT most consistent with multi lobar pneumonia, possibly atypical in etiology. Clinical correlation and follow-up to resolution recommended. There is slight blunting of the left costophrenic angle which may represent scarring or trace pleural effusion. No pneumothorax. The cardiac  silhouette is within normal limits. No acute osseous pathology. IMPRESSION: Multi lobar pneumonia. Clinical correlation and follow-up to resolution recommended. Electronically Signed   By: Anner Crete M.D.   On: 01/15/2023 20:30        Scheduled Meds:  dextromethorphan-guaiFENesin  1 tablet Oral BID   feeding supplement  237 mL Oral BID BM   fluticasone furoate-vilanterol  1 puff Inhalation Daily   And   umeclidinium bromide  1 puff Inhalation Daily   heparin  5,000 Units Subcutaneous Q8H   pantoprazole  40 mg Oral Daily   Continuous Infusions:  sodium chloride 10 mL/hr at 01/16/23 0427   azithromycin Stopped (01/15/23 2306)   cefTRIAXone (ROCEPHIN)  IV Stopped (01/15/23 2118)   lactated ringers 150 mL/hr at 01/16/23 0427   norepinephrine (LEVOPHED) Adult infusion 4 mcg/min (01/16/23 0427)     LOS: 0 days    Time spent: 35 minutes    Jaryan Chicoine Darleen Crocker, DO [facility]  If 7PM-7AM, please contact night-coverage [email or web address] 01/16/2023, 8:09 AM

## 2023-01-16 NOTE — Progress Notes (Signed)
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

## 2023-01-16 NOTE — H&P (Signed)
History and Physical    Patient: Krystal Burgess HYW:[id number] DOB: Apr 22, 1961 DOA: 01/15/2023 DOS: the patient was seen and examined on 01/16/2023 PCP: Hali Marry, MD  Patient coming from: Home  Chief Complaint:  Chief Complaint  Patient presents with   Shortness of Breath   HPI: Krystal Burgess is a 62 y.o. female with medical history significant of COPD on supplemental oxygen at 2 LPM, hypertension, type 2 diabetes mellitus, OSA who presents to the emergency department from home via EMS due to 1 week onset of productive cough of greenish sputum.  Patient was admitted to [facility] from 12/20 to 12/18 due to multifocal pneumonia which was azithromycin and Rocephin and was also initially treated with IV vancomycin due to MRSA PCR being positive, but this was eventually changed to linezolid due to worsening kidney function at that time and was discharged home with an oral form of the medication along with cefdinir.  Patient states that her breathing never returned to baseline since being discharged from the hospital, she followed up with her PCP who encouraged her to complete the prescribed medication on discharge after which she improved.  She had a virtual visit via telephone on 1/24 with a PCP due to 1 week onset of worsening cough with production of green sputum and requiring daily use of inhaler and possible subjective fever.  Home COVID test done was negative.  Apparently, on further questioning, it was realized that patient was exposed to son in law who came home sick.  She denies vomiting, abdominal pain, diarrhea, blurred vision.  ED Course:  In the emergency department, she was febrile with a temperature of 104F, tachypneic, tachycardic, BP was 72/57 and O2 sat was increased to 5 LPM to obtain O2 sat of 93 to 98%.  Workup in the ED showed leukocytosis and normocytic anemia.  BMP showed hyponatremia, BUN/creatinine 24/3.86 (baseline creatinine at  1.3-1.4), lactic acid 2.2 > 1.7, troponin 18 > 14, BNP 70.  Influenza A, B, SARS coronavirus 2, RSV was negative.  Blood culture showed no growth in less than 12 hours. Chest x-ray showed multilobar pneumonia patient was empirically started on IV ceftriaxone and azithromycin, Tylenol was given due to fever, IV hydration was provided, BP continues to be in hypotensive range and patient was started on IV Levophed.  Xanax and Zanaflex were given.  Hospitalist was asked to admit patient for further evaluation and management.  Review of Systems: Review of systems as noted in the HPI. All other systems reviewed and are negative.   Past Medical History:  Diagnosis Date   Allergy    Hyperlipidemia    Pain    Thyroid disease    Past Surgical History:  Procedure Laterality Date   CESAREAN SECTION     CHOLECYSTECTOMY     lipoma removal     RT shoulder   LUMBAR SPINE SURGERY  12/23/2011   Dr. Owens Shark, L4-5   pilonydal cyst  12/22/1992   TOTAL ABDOMINAL HYSTERECTOMY  1999   for abnormal cervical cells and fibroids    Social History:  reports that she quit smoking about 2 years ago. Her smoking use included cigarettes. She smoked an average of 1 pack per day. She has never used smokeless tobacco. She reports that she does not currently use alcohol. She reports that she does not use drugs.   Allergies  Allergen Reactions   Risperidone And Related Shortness Of Breath   Aripiprazole Other (See Comments)  REACTION: muscle jerks    Fluoxetine Hcl Other (See Comments)    REACTION: Intolerant   Nsaids Other (See Comments)    Upper GI bleed    Paroxetine Other (See Comments)    REACTION: Intolerance    Ziprasidone Hcl Other (See Comments)    shakes    Chantix [Varenicline Tartrate] Other (See Comments)    "messing with my mood"   Metformin And Related Nausea Only   Montelukast Other (See Comments)    "it kept me sick with an upper respiratory infection"   Onglyza [Saxagliptin] Nausea  Only   Augmentin [Amoxicillin-Pot Clavulanate] Other (See Comments)    Gets yeast infection every time    Fluoxetine Hcl     REACTION: Intolerant    Family History  Problem Relation Age of Onset   Diabetes Brother    Depression Brother        bipolar   Cancer Brother        throat   Diabetes Other        grandmother   Depression Mother    Hyperlipidemia Mother    Hypertension Mother    Pancreatic cancer Mother    Hypertension Father      Prior to Admission medications   Medication Sig Start Date End Date Taking? Authorizing Provider  albuterol (VENTOLIN HFA) 108 (90 Base) MCG/ACT inhaler INHALE 2 PUFFS INTO THE LUNGS EVERY 4 HOURS AS NEEDED FOR WHEEZING OR SHORTNESS OF BREATH. 04/03/21  Yes Hali Marry, MD  ALPRAZolam Duanne Moron) 1 MG tablet Take 1 tablet (1 mg total) by mouth 2 (two) times daily as needed. for anxiety 01/01/23  Yes Hali Marry, MD  amLODipine (NORVASC) 5 MG tablet Take 5 mg by mouth daily.   Yes [provider]  folic acid (FOLVITE) 1 MG tablet TAKE 1 TABLET BY MOUTH EVERY DAY 09/02/22  Yes Hali Marry, MD  gabapentin (NEURONTIN) 600 MG tablet Take 1 tablet (600 mg total) by mouth 2 (two) times daily. Patient takes 1200 mg daily Patient taking differently: Take 600 mg by mouth See admin instructions. Patient takes 1200 mg at bedtime 06/02/22  Yes Hali Marry, MD  ipratropium-albuterol (DUONEB) 0.5-2.5 (3) MG/3ML SOLN Take 3 mLs by nebulization every 2 (two) hours as needed (shortness of breath). 08/22/22  Yes [provider]  Iron-FA-B Cmp-C-Biot-Probiotic (FUSION PLUS) CAPS Take 1 capsule by mouth daily. 01/01/22  Yes Hali Marry, MD  levocetirizine (XYZAL) 5 MG tablet TAKE 1 TABLET BY MOUTH EVERY DAY IN THE EVENING 09/02/22  Yes Hali Marry, MD  losartan (COZAAR) 100 MG tablet TAKE 1 TABLET (100 MG TOTAL) BY MOUTH DAILY. APPOINTMENT NEEDED FOR FURTHER REFILLS Patient taking differently: Take 100  mg by mouth daily. 10/24/22  Yes Hali Marry, MD  ondansetron (ZOFRAN) 8 MG tablet Take 8 mg by mouth 3 (three) times daily. 03/03/22  Yes [provider]  Oxycodone HCl 10 MG TABS Take 1 tablet (10 mg total) by mouth every 8 (eight) hours. Ok to take extra tab daily for the next 10 days after injury 12/16/22  Yes Hali Marry, MD  pantoprazole (PROTONIX) 40 MG tablet TAKE 1 TABLET BY MOUTH EVERY DAY 11/05/22  Yes Hali Marry, MD  rosuvastatin (CRESTOR) 20 MG tablet TAKE 1 TABLET BY MOUTH EVERY DAY 09/26/22  Yes Hali Marry, MD  sertraline (ZOLOFT) 100 MG tablet TAKE 2 TABLETS BY MOUTH EVERY DAY 10/29/21  Yes Hali Marry, MD  theophylline [facility])  300 MG 12 hr tablet TAKE 1 TABLET BY MOUTH TWICE A DAY 09/02/22  Yes Agapito Games, MD  tiZANidine (ZANAFLEX) 4 MG tablet TAKE 1 TABLET BY MOUTH AT BEDTIME AS NEEDED FOR MUSCLE SPASMS. Patient taking differently: Take 4 mg by mouth at bedtime as needed for muscle spasms. 11/24/22  Yes Agapito Games, MD  traZODone (DESYREL) 100 MG tablet TAKE 1 TABLET BY MOUTH EVERYDAY AT BEDTIME Patient taking differently: Take 100 mg by mouth at bedtime as needed for sleep. 01/05/23  Yes Agapito Games, MD  TRELEGY ELLIPTA 100-62.5-25 MCG/ACT AEPB TAKE 1 PUFF BY MOUTH EVERY DAY Patient taking differently: Inhale 1 puff into the lungs daily. 07/28/22  Yes Agapito Games, MD  AMBULATORY NON FORMULARY MEDICATION Medication Name: Portable battery-powered nebulizer.Dx: COPD. [facility] [phone]) 12/11/21   Agapito Games, MD  AMBULATORY NON FORMULARY MEDICATION Medication Name: Upright standing Rollator with seat. Dx: code M51.36 12/30/21   Agapito Games, MD  B-D 3CC LUER-LOK SYR 25GX1" 25G X 1" 3 ML MISC Inject into the muscle once a week. 11/23/21   [provider]  fluticasone (FLONASE) 50 MCG/ACT nasal spray SPRAY 2 SPRAYS INTO EACH NOSTRIL  EVERY DAY 08/07/21   Agapito Games, MD  ipratropium (ATROVENT) 0.02 % nebulizer solution Take 2.5 mLs (0.5 mg total) by nebulization 3 (three) times daily as needed for wheezing or shortness of breath. Patient not taking: Reported on 01/15/2023 06/27/22   Agapito Games, MD  ipratropium (ATROVENT) 0.06 % nasal spray Place 2 sprays into both nostrils 4 (four) times daily. 02/07/22   Monica Becton, MD  levofloxacin (LEVAQUIN) 500 MG tablet Take 1 tablet (500 mg total) by mouth daily. 01/14/23   Jomarie Longs, PA-C  levothyroxine (SYNTHROID) 88 MCG tablet Take 1 tablet (88 mcg total) by mouth daily. Patient not taking: Reported on 01/15/2023 11/28/21   Agapito Games, MD  predniSONE (DELTASONE) 20 MG tablet Take 3 tablets for 3 days, take 2 tablets for 3 days, take 1 tablet for 3 days, take 1/2 tablet for 4 days then transition to prevention dose. 01/14/23   Breeback, Jade L, PA-C  predniSONE (DELTASONE) 5 MG tablet TAKE 1/2 TABLET (2.5 MG TOTAL) BY MOUTH DAILY WITH BREAKFAST. Patient not taking: Reported on 01/15/2023 01/05/23   Agapito Games, MD  promethazine (PHENERGAN) 25 MG tablet TAKE 1 TABLET BY MOUTH 2 TIMES A DAY AS NEEDED FOR NAUSEA OR VOMITING Patient not taking: Reported on 01/15/2023 01/30/22   Agapito Games, MD    Physical Exam: BP 116/69   Pulse 81   Temp 97.9 F (36.6 C) (Oral)   Resp 15   Ht 5\' 4"  (1.626 m)   Wt 113 kg   SpO2 96%   BMI 42.76 kg/m   General: 61 y.o. year-old female ill appearing, but in no acute distress.  Alert and oriented x3. HEENT: NCAT, EOMI Neck: Supple, trachea medial Cardiovascular: Tachycardia.  Regular rate and rhythm with no rubs or gallops.  No thyromegaly or JVD noted.  No lower extremity edema. 2/4 pulses in all 4 extremities. Respiratory: Clear to auscultation with no wheezes or rales. Good inspiratory effort. Abdomen: Soft, nontender nondistended with normal bowel sounds x4 quadrants. Muskuloskeletal: No  cyanosis, clubbing or edema noted bilaterally Neuro: CN II-XII intact, strength 5/5 x 4, sensation, reflexes intact Skin: No ulcerative lesions noted or rashes Psychiatry: Judgement and insight appear normal. Mood is appropriate for condition and setting  Labs on Admission:  Basic Metabolic Panel: Recent Labs  Lab 01/15/23 2030  NA 132*  K 4.3  CL 98  CO2 21*  GLUCOSE 108*  BUN 24*  CREATININE 3.86*  CALCIUM 8.6*   Liver Function Tests: Recent Labs  Lab 01/15/23 2030  AST 67*  ALT 26  ALKPHOS 94  BILITOT 0.6  PROT 7.2  ALBUMIN 3.0*   No results for input(s): "LIPASE", "AMYLASE" in the last 168 hours. No results for input(s): "AMMONIA" in the last 168 hours. CBC: Recent Labs  Lab 01/15/23 2030  WBC 19.3*  NEUTROABS 16.1*  HGB 8.6*  HCT 28.1*  MCV 96.9  PLT 292   Cardiac Enzymes: No results for input(s): "CKTOTAL", "CKMB", "CKMBINDEX", "TROPONINI" in the last 168 hours.  BNP (last 3 results) Recent Labs    01/15/23 2030  BNP 70.0    ProBNP (last 3 results) No results for input(s): "PROBNP" in the last 8760 hours.  CBG: No results for input(s): "GLUCAP" in the last 168 hours.  Radiological Exams on Admission: DG Chest Port 1 View  Result Date: 01/15/2023 CLINICAL DATA:  Questionable sepsis. EXAM: PORTABLE CHEST 1 VIEW COMPARISON:  Chest radiograph and CT dated 03/02/2021. FINDINGS: Scatter pulmonary nodules as seen on the prior CT most consistent with multi lobar pneumonia, possibly atypical in etiology. Clinical correlation and follow-up to resolution recommended. There is slight blunting of the left costophrenic angle which may represent scarring or trace pleural effusion. No pneumothorax. The cardiac silhouette is within normal limits. No acute osseous pathology. IMPRESSION: Multi lobar pneumonia. Clinical correlation and follow-up to resolution recommended. Electronically Signed   By: Elgie Collard M.D.   On: 01/15/2023 20:30    EKG: I  independently viewed the EKG done and my findings are as followed: Sinus tachycardia at a rate of 111 bpm  Assessment/Plan Present on Admission:  Sepsis due to pneumonia (HCC)  Acute on chronic respiratory failure with hypoxia (HCC)  Essential hypertension  Obesity, Class III, BMI 40-49.9 (morbid obesity) (HCC)  Mixed hyperlipidemia  COPD (chronic obstructive pulmonary disease) (HCC)  Principal Problem:   Sepsis due to pneumonia [facility]) Active Problems:   Mixed hyperlipidemia   Essential hypertension   Type 2 diabetes mellitus (HCC)   COPD (chronic obstructive pulmonary disease) (HCC)   Obesity, Class III, BMI 40-49.9 (morbid obesity) (HCC)   Acute on chronic respiratory failure with hypoxia (HCC)   Acute kidney injury superimposed on chronic kidney disease (HCC)   Lactic acidosis   Elevated troponin   Hypoalbuminemia due to protein-calorie malnutrition (HCC)  Sepsis secondary to acute on chronic respiratory failure with hypoxia secondary to CAP POA Patient met sepsis criteria due to leukocytosis, fever, tachycardia and tachypnea (met SIRS criteria) with source of infection being the lungs due to multilobar pneumonia Chest x-ray was suggestive of multilobar pneumonia Patient was started on ceftriaxone and azithromycin, we shall continue same at this time with plan to de-escalate/discontinue based on blood culture, sputum culture, urine Legionella, strep pneumo and procalcitonin Continue Tylenol as needed Continue Mucinex, incentive spirometry, flutter valve   Acute on chronic respiratory failure with hypoxia in the setting of above Supplemental oxygen was increased to 5 LPM to obtain O2 sat of 97 to 98% Continue treatment as described above  Lactic acidosis-resolved  Hypoalbuminemia secondary to moderate protein calorie malnutrition Albumin 3.0, protein supplement will be provided  Acute kidney injury superimposed on CKD stage V BUN/creatinine 24/3.86 (baseline creatinine at  1.3-1.4) Continue IV hydration Renally adjust medications, avoid  nephrotoxic agents/dehydration/hypotension  Elevated troponin possibly secondary to type II demand ischemia Troponin 18 > 14; troponin is flattened out  COPD (not in acute exacerbation) Continue albuterol, DuoNeb, Breo and Incruse elliptica  Essential hypertension BP meds will be held at this time due to soft BP  Mixed hyperlipidemia Statin will be held at this time due to elevated AST  Type 2 diabetes mellitus Patient was not on any T2DM medication per med rec Hemoglobin A1c will be checked  Obesity class III (BMI 42.76) Continue lifestyle and diet modification   DVT prophylaxis: Heparin subcu  Code Status: Full code  Consults: None  Family Communication: None  Severity of Illness: The appropriate patient status for this patient is INPATIENT. Inpatient status is judged to be reasonable and necessary in order to provide the required intensity of service to ensure the patient's safety. The patient's presenting symptoms, physical exam findings, and initial radiographic and laboratory data in the context of their chronic comorbidities is felt to place them at high risk for further clinical deterioration. Furthermore, it is not anticipated that the patient will be medically stable for discharge from the hospital within 2 midnights of admission.   * I certify that at the point of admission it is my clinical judgment that the patient will require inpatient hospital care spanning beyond 2 midnights from the point of admission due to high intensity of service, high risk for further deterioration and high frequency of surveillance required.*  Author: Frankey Shown, DO 01/16/2023 4:09 AM  For on call review [email or web address].

## 2023-01-16 NOTE — TOC Initial Note (Signed)
Transition of Care [facility]) - Initial/Assessment Note    Patient Details  Name: Krystal Burgess MRN: [id number] Date of Birth: Aug 02, 1961  Transition of [facility]) CM/SW Contact:    Iona Beard, [place] Phone Number: 01/16/2023, 10:08 AM  Clinical Narrative:                 Pt is high risk for readmission. TOC consulted for HH/DME needs. CSW spoke with pts daughter to complete assessment. Pt lives with her daughter. Her daughter assists with ADLs and provides transportation. Pt has not had HH. Pt has a walker and wheelchair to use when needed. Pt wears home O2 supplied through [employer]. Pts daughter inquiring about SNF placement as pt is very weak. CSW explained that PT will have to evaluate pt and TOC will follow up on PT recommendations with pt and daughter. TOC to follow.   Expected Discharge Plan: Skilled Nursing Facility Barriers to Discharge: Continued Medical Work up   Patient Goals and CMS Choice Patient states their goals for this hospitalization and ongoing recovery are:: get better CMS Medicare.gov Compare Post Acute Care list provided to:: Patient Represenative (must comment) Choice offered to / list presented to : Patient, Adult Children      Expected Discharge Plan and Services In-house Referral: Clinical Social Work Discharge Planning Services: CM Consult Post Acute Care Choice: [place] Living arrangements for the past 2 months: [place]                                      Prior Living Arrangements/Services Living arrangements for the past 2 months: Single Family Home Lives with:: Adult Children Patient language and need for interpreter reviewed:: Yes Do you feel safe going back to the place where you live?: Yes      Need for Family Participation in Patient Care: Yes (Comment) Care giver support system in place?: Yes (comment) Current home services: DME Criminal Activity/Legal Involvement Pertinent to Current  Situation/Hospitalization: No - Comment as needed  Activities of Daily Living Home Assistive Devices/Equipment: Oxygen, Nebulizer, Wheelchair, Environmental consultant (specify type), Shower chair with back, CBG Meter ADL Screening (condition at time of admission) Patient's cognitive ability adequate to safely complete daily activities?: Yes Is the patient deaf or have difficulty hearing?: No Does the patient have difficulty seeing, even when wearing glasses/contacts?: No Does the patient have difficulty concentrating, remembering, or making decisions?: No Patient able to express need for assistance with ADLs?: Yes Does the patient have difficulty dressing or bathing?: Yes Independently performs ADLs?: No Communication: Independent Dressing (OT): Needs assistance Is this a change from baseline?: Pre-admission baseline Grooming: Needs assistance Is this a change from baseline?: Pre-admission baseline Feeding: Independent Bathing: Needs assistance Is this a change from baseline?: Pre-admission baseline Toileting: Needs assistance Is this a change from baseline?: Pre-admission baseline In/Out Bed: Independent with device (comment) Walks in Home: Independent with device (comment) Does the patient have difficulty walking or climbing stairs?: Yes Weakness of Legs: Both Weakness of Arms/Hands: None  Permission Sought/Granted                  Emotional Assessment         Alcohol / Substance Use: Not Applicable Psych Involvement: No (comment)  Admission diagnosis:  CAP (community acquired pneumonia) [J18.9] Nonspecific chest pain [R07.9] Patient Active Problem List   Diagnosis Date Noted   Sepsis due to pneumonia (  [place]) 01/16/2023   Acute kidney injury superimposed on chronic kidney disease ([place]) 01/16/2023   Lactic acidosis 01/16/2023   Elevated troponin 01/16/2023   Hypoalbuminemia due to protein-calorie malnutrition ([place]) 01/16/2023   Iron deficiency 01/01/2022   COPD exacerbation ([place])  10/09/2021   Nausea 05/28/2021   Abnormal chest CT 50/93/2671   Folic acid deficiency 24/58/0998   Acute on chronic respiratory failure with hypoxia (HCC) 03/02/2021   Elevated d-dimer 03/02/2021   Macrocytic anemia 03/02/2021   Peripheral neuropathy 03/02/2021   OSA (obstructive sleep apnea) 03/02/2021   RLS (restless legs syndrome) 01/14/2019   Sinus tachycardia 04/08/2018   Hypernatremia 04/08/2018   Chronic ethmoidal sinusitis 01/29/2018   CKD (chronic kidney disease) stage 3, GFR 30-59 ml/min (HCC) 10/26/2017   Obesity, Class III, BMI 40-49.9 (morbid obesity) ([place]) 10/26/2017   Cutaneous candidiasis 10/24/2017   Chronic back pain 10/24/2017   Chronic respiratory failure with hypoxia ([place]) 10/23/2017   Hypomagnesemia 10/23/2017   Glaucoma 10/23/2017   COPD (chronic obstructive pulmonary disease) ([place]) 09/09/2017   Chronic insomnia 01/30/2017   Pulmonary nodule, RML, needs noncontrast CT in February 2019 01/26/2017   Lumbar degenerative disc disease 08/14/2016   Combined forms of age-related cataract of 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   Posterior synechiae (iris), 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   Lower GI bleed 10/18/2014   Gastroesophageal reflux disease without esophagitis 10/12/2014   Diabetic peripheral neuropathy associated with type 2 diabetes mellitus ([place]) 10/12/2014   Leukocytosis 10/08/2014   History of anxiety 10/08/2014   Type 2 diabetes mellitus ([place]) 09/07/2014   Acute angle-closure glaucoma 10/03/2013   Bilateral carpal tunnel syndrome 03/14/2013   Bipolar disorder ([place]) 01/24/2013   Steroid-induced diabetes ([place]) 01/06/2013   Vitamin B 12 deficiency 06/24/2011   ALLERGIC RHINITIS 08/17/2009   UNSPECIFIED VITAMIN D DEFICIENCY 07/13/2009   Severe obesity (BMI >= 40) ([place]) 05/03/2009   Essential hypertension 03/26/2009   LEG EDEMA, BILATERAL 03/26/2009   Hypothyroidism 02/20/2009   Mixed hyperlipidemia 12/12/2008   Former smoker 12/12/2008   DEPRESSION 12/12/2008    Mononeuritis 12/12/2008   Intrinsic asthma 12/12/2008   COPD (chronic obstructive pulmonary disease) with chronic bronchitis 12/12/2008   Spinal stenosis of lumbar region 12/12/2008   PCP:  Hali Marry, MD Pharmacy:   [id number] - [place], [place] [address] Phone: [phone] Fax: [phone]     Social Determinants of Health (SDOH) Social History: SDOH Screenings   Depression (PHQ2-9): High Risk (12/16/2022)  Tobacco Use: Medium Risk (01/15/2023)   SDOH Interventions:     Readmission Risk Interventions    01/16/2023   10:07 AM  Readmission Risk Prevention Plan  Transportation Screening Complete  HRI or Home Care Consult Complete  Social Work Consult for [place] Planning/Counseling Complete  Palliative Care Screening Not Applicable  Medication Review Press photographer) Complete

## 2023-01-16 NOTE — ED Notes (Addendum)
Pt daughter, Lexine Baton, who visited with pt earlier and requested she be called when admission decision was made has been called as requested,. [phone], spoke with Lexine Baton and informed that pt would be admitted to ICU here at [facility]

## 2023-01-16 NOTE — ED Notes (Signed)
Third attempt to reach Critical care

## 2023-01-17 DIAGNOSIS — J189 Pneumonia, unspecified organism: Secondary | ICD-10-CM | POA: Diagnosis not present

## 2023-01-17 DIAGNOSIS — A419 Sepsis, unspecified organism: Secondary | ICD-10-CM | POA: Diagnosis not present

## 2023-01-17 LAB — CBC
HCT: 25.8 % — ABNORMAL LOW (ref 36.0–46.0)
Hemoglobin: 8 g/dL — ABNORMAL LOW (ref 12.0–15.0)
MCH: 29.3 pg (ref 26.0–34.0)
MCHC: 31 g/dL (ref 30.0–36.0)
MCV: 94.5 fL (ref 80.0–100.0)
Platelets: 313 10*3/uL (ref 150–400)
RBC: 2.73 MIL/uL — ABNORMAL LOW (ref 3.87–5.11)
RDW: 14.8 % (ref 11.5–15.5)
WBC: 15.3 10*3/uL — ABNORMAL HIGH (ref 4.0–10.5)
nRBC: 0 % (ref 0.0–0.2)

## 2023-01-17 LAB — MAGNESIUM: Magnesium: 1.5 mg/dL — ABNORMAL LOW (ref 1.7–2.4)

## 2023-01-17 LAB — BASIC METABOLIC PANEL
Anion gap: 10 (ref 5–15)
BUN: 20 mg/dL (ref 6–20)
CO2: 25 mmol/L (ref 22–32)
Calcium: 8.1 mg/dL — ABNORMAL LOW (ref 8.9–10.3)
Chloride: 99 mmol/L (ref 98–111)
Creatinine, Ser: 2.34 mg/dL — ABNORMAL HIGH (ref 0.44–1.00)
GFR, Estimated: 23 mL/min — ABNORMAL LOW (ref >60)
Glucose, Bld: 83 mg/dL (ref 70–99)
Potassium: 3.7 mmol/L (ref 3.5–5.1)
Sodium: 134 mmol/L — ABNORMAL LOW (ref 135–145)

## 2023-01-17 MED ORDER — MAGNESIUM SULFATE 2 GM/50ML IV SOLN
2.0000 g | Freq: Once | INTRAVENOUS | Status: AC
  Administered 2023-01-17: 2 g via INTRAVENOUS
  Filled 2023-01-17: qty 50

## 2023-01-17 NOTE — Evaluation (Signed)
Physical Therapy Evaluation Patient Details Name: Krystal Burgess MRN: [id number] DOB: 06-11-1961 Today's Date: 01/17/2023  History of Present Illness  Krystal Burgess is a 62 y.o. female with medical history significant of COPD on supplemental oxygen at 2 LPM, hypertension, type 2 diabetes mellitus, OSA who presents to the emergency department from home via EMS due to 1 week onset of productive cough of greenish sputum.  Patient was admitted to [facility] from 12/20 to 12/18 due to multifocal pneumonia which was azithromycin and Rocephin and was also initially treated with IV vancomycin due to MRSA PCR being positive, but this was eventually changed to linezolid due to worsening kidney function at that time and was discharged home with an oral form of the medication along with cefdinir.  Patient states that her breathing never returned to baseline since being discharged from the hospital, she followed up with her PCP who encouraged her to complete the prescribed medication on discharge after which she improved.  She had a virtual visit via telephone on 1/24 with a PCP due to 1 week onset of worsening cough with production of green sputum and requiring daily use of inhaler and possible subjective fever.  Home COVID test done was negative.  Apparently, on further questioning, it was realized that patient was exposed to son in law who came home sick.  She denies vomiting, abdominal pain, diarrhea, blurred vision.    Clinical Impression  Patient in bathroom on toilet on therapist arrival (assisted by nursing).  Sit to stand from toilet with mod assist to RW.  Patient then ambulates with RW and PT min to mod A with slow gait speed; forward flexed trunk  and significant shortness of breath x 8 ft to bed.  She sits with decreased control on the bed; needs min A for sit to supine.  Patient O2 saturation at 86% after walking on 4.5 L of oxygen.  She is very short of breath and has  difficulty talking.  She recovers to 90% O2 sat after about a 10 min rest and her shortness of breath subsides.  Patient left in bed with nursing present in the room.  Patient will benefit from continued skilled therapy services during the remainder of her hospital stay and at the next recommended venue of care to address deficits and promote return to optimal function.           Recommendations for follow up therapy are one component of a multi-disciplinary discharge planning process, led by the attending physician.  Recommendations may be updated based on patient status, additional functional criteria and insurance authorization.  Follow Up Recommendations Skilled nursing-short term rehab (<3 hours/day) Can patient physically be transported by private vehicle: Yes    Assistance Recommended at Discharge Intermittent Supervision/Assistance  Patient can return home with the following  A lot of help with walking and/or transfers;Help with stairs or ramp for entrance;A lot of help with bathing/dressing/bathroom    Equipment Recommendations None recommended by PT  Recommendations for Other Services       Functional Status Assessment Patient has had a recent decline in their functional status and demonstrates the ability to make significant improvements in function in a reasonable and predictable amount of time.     Precautions / Restrictions Precautions Precautions: Fall Restrictions Weight Bearing Restrictions: No      Mobility  Bed Mobility Overal bed mobility: Needs Assistance Bed Mobility: Sit to Supine       Sit to supine: Min  assist     Patient Response: Anxious, Cooperative  Transfers Overall transfer level: Needs assistance Equipment used: Rolling walker (2 wheels) Transfers: Sit to/from Stand Sit to Stand: Mod assist           General transfer comment: sit to stand from toilet to RW with mod A    Ambulation/Gait Ambulation/Gait assistance: Min assist, Mod  assist Gait Distance (Feet): 8 Feet Assistive device: Rolling walker (2 wheels) Gait Pattern/deviations: Wide base of support, Trunk flexed, Antalgic       General Gait Details: slow antalgic gait; fatigues quickly; shortness of breath noted  Stairs            Wheelchair Mobility    Modified Rankin (Stroke Patients Only)       Balance Overall balance assessment: Needs assistance Sitting-balance support: Feet supported, Bilateral upper extremity supported Sitting balance-Leahy Scale: Fair Sitting balance - Comments: fair to good sitting balance on the edge of the bed with feet and arms supported   Standing balance support: Bilateral upper extremity supported, Reliant on assistive device for balance, During functional activity Standing balance-Leahy Scale: Fair Standing balance comment: fair standing balance with RW and PT min to mod A; walks with forward flexed trunk; forward head and shoulders                             Pertinent Vitals/Pain Pain Assessment Pain Assessment: 0-10 Pain Score: 8  Pain Location: neck Pain Intervention(s): Monitored during session    Home Living Family/patient expects to be discharged to:: Private residence Living Arrangements: Children (daughter) Available Help at Discharge: Family;Available 24 hours/day Type of Home: House Home Access: Stairs to enter Entrance Stairs-Rails: Psychiatric nurse of Steps: 4 steps to to enter home; bilateral railing but can only reach one at a time; 2 steps to access her living area of the home without railings   Home Layout: One level Home Equipment: Shower seat - built in;Rollator (4 wheels);BSC/3in1;Grab bars - tub/shower;Hand held shower head      Prior Function Prior Level of Function : Needs assist       Physical Assist : Mobility (physical) Mobility (physical): Gait;Stairs   Mobility Comments: daughter assists as needed       Hand Dominance   Dominant  Hand: Right    Extremity/Trunk Assessment   Upper Extremity Assessment Upper Extremity Assessment: Generalized weakness    Lower Extremity Assessment Lower Extremity Assessment: Generalized weakness    Cervical / Trunk Assessment Cervical / Trunk Assessment: Kyphotic  Communication   Communication: No difficulties  Cognition Arousal/Alertness: Awake/alert Behavior During Therapy: WFL for tasks assessed/performed Overall Cognitive Status: Within Functional Limits for tasks assessed                                          General Comments      Exercises     Assessment/Plan    PT Assessment Patient needs continued PT services  PT Problem List Decreased strength;Decreased balance;Pain;Decreased range of motion;Decreased mobility;Decreased activity tolerance;Obesity;Cardiopulmonary status limiting activity       PT Treatment Interventions Functional mobility training;Balance training;Patient/family education;Gait training;Therapeutic activities;Neuromuscular re-education;Therapeutic exercise;Stair training    PT Goals (Current goals can be found in the Care Plan section)  Acute Rehab PT Goals PT Goal Formulation: With patient Time For Goal Achievement: 01/30/23 Potential to Achieve Goals: Good  Frequency Min 3X/week     Co-evaluation               AM-PAC PT "6 Clicks" Mobility  Outcome Measure Help needed turning from your back to your side while in a flat bed without using bedrails?: A Lot Help needed moving from lying on your back to sitting on the side of a flat bed without using bedrails?: A Lot Help needed moving to and from a bed to a chair (including a wheelchair)?: A Lot Help needed standing up from a chair using your arms (e.g., wheelchair or bedside chair)?: A Lot Help needed to walk in hospital room?: A Lot Help needed climbing 3-5 steps with a railing? : Total 6 Click Score: 11    End of Session Equipment Utilized During  Treatment: Oxygen Activity Tolerance: Patient limited by fatigue;Patient limited by pain Patient left: in bed;with nursing/sitter in room;with call bell/phone within reach Nurse Communication: Mobility status PT Visit Diagnosis: Muscle weakness (generalized) (M62.81);Unsteadiness on feet (R26.81);Difficulty in walking, not elsewhere classified (R26.2)    Time: 1040-1058 PT Time Calculation (min) (ACUTE ONLY): 18 min   Charges:   PT Evaluation $PT Eval Low Complexity: 1 Low          11:20 AM, 01/17/23 Thadd Apuzzo Small Orel Cooler MPT [place] [facility] [place] [phone]

## 2023-01-17 NOTE — Progress Notes (Signed)
Notified Dr. Manuella Ghazi patient's daughter in the room and requesting update.

## 2023-01-17 NOTE — Plan of Care (Signed)
  Problem: Acute Rehab PT Goals(only PT should resolve) Goal: Pt Will Go Supine/Side To Sit Outcome: Progressing Flowsheets (Taken 01/17/2023 1122) Pt will go Supine/Side to Sit: with minimal assist Goal: Patient Will Transfer Sit To/From Stand Outcome: Progressing Flowsheets (Taken 01/17/2023 1122) Patient will transfer sit to/from stand: with minimal assist Goal: Pt Will Transfer Bed To Chair/Chair To Bed Outcome: Progressing Flowsheets (Taken 01/17/2023 1122) Pt will Transfer Bed to Chair/Chair to Bed: with min assist Goal: Pt Will Ambulate Outcome: Progressing Flowsheets (Taken 01/17/2023 1122) Pt will Ambulate:  15 feet  with minimal assist  with rolling walker

## 2023-01-17 NOTE — [place] FL2 (Signed)
[place] MEDICAID FL2 LEVEL OF CARE FORM     IDENTIFICATION  Patient Name: Krystal Burgess Birthdate: 19-Nov-1961 Sex: female Admission Date (Current Location): 01/15/2023  [facility] and [place] Number:  [employer] and Address:  [facility],  [address], [place] [address]      Provider Number: [id number]  Attending Physician Name and Address:  Erick Blinks, DO  Relative Name and Phone Number:       Current Level of Care: Hospital Recommended Level of Care: Skilled Nursing Facility Prior Approval Number:    Date Approved/Denied:   PASRR Number: [id number]  Discharge Plan: SNF    Current Diagnoses: Patient Active Problem List   Diagnosis Date Noted   Sepsis due to pneumonia (HCC) 01/16/2023   Acute kidney injury superimposed on chronic kidney disease (HCC) 01/16/2023   Lactic acidosis 01/16/2023   Elevated troponin 01/16/2023   Hypoalbuminemia due to protein-calorie malnutrition (HCC) 01/16/2023   Iron deficiency 01/01/2022   COPD exacerbation (HCC) 10/09/2021   Nausea 05/28/2021   Abnormal chest CT 03/27/2021   Folic acid deficiency 03/27/2021   Acute on chronic respiratory failure with hypoxia (HCC) 03/02/2021   Elevated d-dimer 03/02/2021   Macrocytic anemia 03/02/2021   Peripheral neuropathy 03/02/2021   OSA (obstructive sleep apnea) 03/02/2021   RLS (restless legs syndrome) 01/14/2019   Sinus tachycardia 04/08/2018   Hypernatremia 04/08/2018   Chronic ethmoidal sinusitis 01/29/2018   CKD (chronic kidney disease) stage 3, GFR 30-59 ml/min (HCC) 10/26/2017   Obesity, Class III, BMI 40-49.9 (morbid obesity) (HCC) 10/26/2017   Cutaneous candidiasis 10/24/2017   Chronic back pain 10/24/2017   Chronic respiratory failure with hypoxia (HCC) 10/23/2017   Hypomagnesemia 10/23/2017   Glaucoma 10/23/2017   COPD (chronic obstructive pulmonary disease) (HCC) 09/09/2017   Chronic insomnia 01/30/2017   Pulmonary nodule, RML, needs  noncontrast CT in February 2019 01/26/2017   Lumbar degenerative disc disease 08/14/2016   Combined forms of age-related cataract of 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   Posterior synechiae (iris), 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   Lower GI bleed 10/18/2014   Gastroesophageal reflux disease without esophagitis 10/12/2014   Diabetic peripheral neuropathy associated with type 2 diabetes mellitus (HCC) 10/12/2014   Leukocytosis 10/08/2014   History of anxiety 10/08/2014   Type 2 diabetes mellitus (HCC) 09/07/2014   Acute angle-closure glaucoma 10/03/2013   Bilateral carpal tunnel syndrome 03/14/2013   Bipolar disorder (HCC) 01/24/2013   Steroid-induced diabetes (HCC) 01/06/2013   Vitamin B 12 deficiency 06/24/2011   ALLERGIC RHINITIS 08/17/2009   UNSPECIFIED VITAMIN D DEFICIENCY 07/13/2009   Severe obesity (BMI >= 40) (HCC) 05/03/2009   Essential hypertension 03/26/2009   LEG EDEMA, BILATERAL 03/26/2009   Hypothyroidism 02/20/2009   Mixed hyperlipidemia 12/12/2008   Former smoker 12/12/2008   DEPRESSION 12/12/2008   Mononeuritis 12/12/2008   Intrinsic asthma 12/12/2008   COPD (chronic obstructive pulmonary disease) with chronic bronchitis 12/12/2008   Spinal stenosis of lumbar region 12/12/2008    Orientation RESPIRATION BLADDER Height & Weight     Self, Time, Situation, Place  O2 (see dc summary) Continent Weight: 111.3 kg Height:  5\' 4"  (162.6 cm)  BEHAVIORAL SYMPTOMS/MOOD NEUROLOGICAL BOWEL NUTRITION STATUS      Continent Diet (see dc summary)  AMBULATORY STATUS COMMUNICATION OF NEEDS Skin   Extensive Assist Verbally Normal                       Personal Care Assistance Level of Assistance  Bathing, Feeding, Dressing  Bathing Assistance: Limited assistance Feeding assistance: Independent Dressing Assistance: Limited assistance     Functional Limitations Info  Sight, Hearing, Speech Sight Info: Adequate Hearing Info: Adequate Speech Info: Adequate    SPECIAL CARE FACTORS FREQUENCY   PT (By licensed PT), OT (By licensed OT)     PT Frequency: 5x week OT Frequency: 3x week            Contractures Contractures Info: Not present    Additional Factors Info  Code Status, Allergies Code Status Info: Full Allergies Info: Risperidone And Related, Aripiprazole, Fluoxetine Hcl, Nsaids, Paroxetine, Ziprasidone Hcl, Chantix (Varenicline Tartrate), Metformin And Related, Montelukast, Onglyza (Saxagliptin), Augmentin (Amoxicillin-pot Clavulanate), Fluoxetine Hcl           Current Medications (01/17/2023):  This is the current hospital active medication list Current Facility-Administered Medications  Medication Dose Route Frequency Provider Last Rate Last Admin   0.9 %  sodium chloride infusion  250 mL Intravenous Continuous Adefeso, Oladapo, DO   Stopped at 01/17/23 0631   acetaminophen (TYLENOL) tablet 650 mg  650 mg Oral Q6H PRN Adefeso, Oladapo, DO   650 mg at 01/16/23 0831   Or   acetaminophen (TYLENOL) suppository 650 mg  650 mg Rectal Q6H PRN Adefeso, Oladapo, DO       albuterol (VENTOLIN HFA) 108 (90 Base) MCG/ACT inhaler 2 puff  2 puff Inhalation Q4H PRN Adefeso, Oladapo, DO       ALPRAZolam (XANAX) tablet 1 mg  1 mg Oral BID PRN Manuella Ghazi, Pratik D, DO   1 mg at 01/16/23 2321   azithromycin (ZITHROMAX) 500 mg in sodium chloride 0.9 % 250 mL IVPB  500 mg Intravenous Q24H Adefeso, Oladapo, DO   Stopped at 01/16/23 2312   cefTRIAXone (ROCEPHIN) 2 g in sodium chloride 0.9 % 100 mL IVPB  2 g Intravenous Q24H Adefeso, Oladapo, DO   Stopped at 01/16/23 2125   Chlorhexidine Gluconate Cloth 2 % PADS 6 each  6 each Topical Daily Manuella Ghazi, Pratik D, DO   6 each at 01/17/23 1017   dextromethorphan-guaiFENesin ([place] DM) 30-600 MG per 12 hr tablet 1 tablet  1 tablet Oral BID Adefeso, Oladapo, DO   1 tablet at 01/17/23 1019   feeding supplement (ENSURE ENLIVE / ENSURE PLUS) liquid 237 mL  237 mL Oral BID BM Adefeso, Oladapo, DO   237 mL at 01/16/23 1341   fluticasone furoate-vilanterol  (BREO ELLIPTA) 100-25 MCG/ACT 1 puff  1 puff Inhalation Daily Adefeso, Oladapo, DO   1 puff at 01/17/23 0837   And   umeclidinium bromide (INCRUSE ELLIPTA) 62.5 MCG/ACT 1 puff  1 puff Inhalation Daily Adefeso, Oladapo, DO   1 puff at 27/74/12 8786   folic acid (FOLVITE) tablet 1 mg  1 mg Oral Daily Manuella Ghazi, Pratik D, DO   1 mg at 01/17/23 1019   guaiFENesin-dextromethorphan (ROBITUSSIN DM) 100-10 MG/5ML syrup 5 mL  5 mL Oral Q4H PRN Adefeso, Oladapo, DO   5 mL at 01/16/23 2321   heparin injection 5,000 Units  5,000 Units Subcutaneous Q8H Adefeso, Oladapo, DO   5,000 Units at 01/17/23 7672   ipratropium-albuterol (DUONEB) 0.5-2.5 (3) MG/3ML nebulizer solution 3 mL  3 mL Nebulization Q2H PRN Adefeso, Oladapo, DO   3 mL at 01/17/23 0837   levothyroxine (SYNTHROID) tablet 88 mcg  88 mcg Oral Daily Heath Lark D, DO   88 mcg at 01/17/23 0947   midodrine (PROAMATINE) tablet 10 mg  10 mg Oral TID WC Manuella Ghazi, Pratik D, DO  10 mg at 01/17/23 1019   norepinephrine (LEVOPHED) 4mg  in 230mL (0.016 mg/mL) premix infusion  2-10 mcg/min Intravenous Titrated Adefeso, Oladapo, DO   Stopped at 01/16/23 1352   ondansetron (ZOFRAN) tablet 4 mg  4 mg Oral Q6H PRN Adefeso, Oladapo, DO       Or   ondansetron (ZOFRAN) injection 4 mg  4 mg Intravenous Q6H PRN Adefeso, Oladapo, DO   4 mg at 01/17/23 1423   oxyCODONE (Oxy IR/ROXICODONE) immediate release tablet 10 mg  10 mg Oral Q8H PRN Manuella Ghazi, Pratik D, DO   10 mg at 01/17/23 1426   pantoprazole (PROTONIX) EC tablet 40 mg  40 mg Oral Daily Adefeso, Oladapo, DO   40 mg at 01/17/23 1019   rosuvastatin (CRESTOR) tablet 20 mg  20 mg Oral Daily Manuella Ghazi, Pratik D, DO   20 mg at 01/17/23 1019   sertraline (ZOLOFT) tablet 200 mg  200 mg Oral Daily Manuella Ghazi, Pratik D, DO   200 mg at 01/17/23 1019   tiZANidine (ZANAFLEX) tablet 4 mg  4 mg Oral QHS PRN Manuella Ghazi, Pratik D, DO       traZODone (DESYREL) tablet 100 mg  100 mg Oral QHS PRN Manuella Ghazi, Pratik D, DO   100 mg at 01/16/23 2053     Discharge  Medications: Please see discharge summary for a list of discharge medications.  Relevant Imaging Results:  Relevant Lab Results:   Additional Information SSN: [id number]  Boneta Lucks, RN

## 2023-01-17 NOTE — Progress Notes (Signed)
PROGRESS NOTE    Krystal Burgess  DVV:[id number] DOB: [birth year] DOA: 01/15/2023 PCP: Hali Marry, MD   Brief Narrative:  Krystal Burgess is a 62 y.o. female with medical history significant of COPD on supplemental oxygen at 2 LPM, hypertension, type 2 diabetes mellitus, OSA who presents to the emergency department from home via EMS due to 1 week onset of productive cough of greenish sputum.  She was admitted for sepsis secondary to acute on chronic hypoxemic respiratory failure in the setting of community-acquired pneumonia.  Chest x-ray with multilobar pneumonia noted and she was previously admitted last month with pneumonia issues as well.  She is also noted to have AKI on CKD stage V.  She no longer requires norepinephrine and blood pressures are stable.  PT recommending SNF on discharge.   Assessment & Plan:   Principal Problem:   Sepsis due to pneumonia [facility]) Active Problems:   Mixed hyperlipidemia   Essential hypertension   Type 2 diabetes mellitus (HCC)   COPD (chronic obstructive pulmonary disease) (HCC)   Obesity, Class III, BMI 40-49.9 (morbid obesity) (HCC)   Acute on chronic respiratory failure with hypoxia (HCC)   Acute kidney injury superimposed on chronic kidney disease (HCC)   Lactic acidosis   Elevated troponin   Hypoalbuminemia due to protein-calorie malnutrition (HCC)  Assessment and Plan:  Sepsis secondary to acute on chronic respiratory failure with hypoxia secondary to CAP POA Patient met sepsis criteria due to leukocytosis, fever, tachycardia and tachypnea (met SIRS criteria) with source of infection being the lungs due to multilobar pneumonia Chest x-ray was suggestive of multilobar pneumonia Patient was started on ceftriaxone and azithromycin, we shall continue same at this time  Blood cultures with no growth to date Continue Tylenol as needed Continue Mucinex, incentive spirometry, flutter valve  Wears 2 L at baseline at home Hypotension  resolved with midodrine, continue for now   Acute on chronic respiratory failure with hypoxia in the setting of above Supplemental oxygen was increased to 5 LPM to obtain O2 sat of 97 to 98% Continue treatment as described above Wears 2 L nasal cannula at rest at home Currently 4 L nasal cannula   Lactic acidosis-resolved   Hypoalbuminemia secondary to moderate protein calorie malnutrition Albumin 3.0, protein supplement will be provided   Acute kidney injury superimposed on CKD stage V-improving BUN/creatinine 24/3.86 (baseline creatinine at 1.3-1.4) Continue IV hydration Renally adjust medications, avoid nephrotoxic agents/dehydration/hypotension   Elevated troponin possibly secondary to type II demand ischemia Troponin 18 > 14; troponin is flattened out   COPD (not in acute exacerbation) Continue albuterol, DuoNeb, Breo and Incruse elliptica   Essential hypertension BP meds will be held at this time due to soft BP Currently on midodrine for blood pressure support   Mixed hyperlipidemia Statin will be held at this time due to elevated AST   Type 2 diabetes mellitus Patient was not on any T2DM medication per med rec Hemoglobin A1c will be checked   Obesity class III (BMI 42.76) Continue lifestyle and diet modification  Chronic pain secondary to low back pain/DJD Continue home medications  Hypomagnesemia Replete and reevaluate in a.m.    DVT prophylaxis: Heparin Code Status: Full Family Communication: None at bedside Disposition Plan:  Status is: Inpatient Remains inpatient appropriate because: Need for IV medications.   Consultants:  None  Procedures:  None  Antimicrobials:  Anti-infectives (From admission, onward)    Start     Dose/Rate Route Frequency Ordered Stop  01/15/23 2000  cefTRIAXone (ROCEPHIN) 2 g in sodium chloride 0.9 % 100 mL IVPB        2 g 200 mL/hr over 30 Minutes Intravenous Every 24 hours 01/15/23 1955 01/20/23 1959   01/15/23  2000  azithromycin (ZITHROMAX) 500 mg in sodium chloride 0.9 % 250 mL IVPB        500 mg 250 mL/hr over 60 Minutes Intravenous Every 24 hours 01/15/23 1955 01/20/23 1959       Subjective: Patient seen and evaluated today with complaints of cough and congestion.  No acute overnight events noted.  Blood pressures have improved with use of midodrine.  Objective: Vitals:   01/17/23 0800 01/17/23 0837 01/17/23 0900 01/17/23 1108  BP: (!) 107/43  (!) 110/47   Pulse: 85  87   Resp: (!) 21  (!) 24   Temp:      TempSrc:      SpO2: 96% 95% 94% 90%  Weight:      Height:        Intake/Output Summary (Last 24 hours) at 01/17/2023 1202 Last data filed at 01/17/2023 1026 Gross per 24 hour  Intake 567.85 ml  Output 1650 ml  Net -1082.15 ml   Filed Weights   01/15/23 1951 01/17/23 0457  Weight: 113 kg 111.3 kg    Examination:  General exam: Appears calm and comfortable, morbidly obese Respiratory system: Clear to auscultation. Respiratory effort normal.  3-4 L nasal cannula oxygen Cardiovascular system: S1 & S2 heard, RRR.  Gastrointestinal system: Abdomen is soft Central nervous system: Alert and awake Extremities: No edema Skin: No significant lesions noted Psychiatry: Flat affect.    Data Reviewed: I have personally reviewed following labs and imaging studies  CBC: Recent Labs  Lab 01/15/23 2030 01/16/23 0430 01/17/23 0256  WBC 19.3* 21.9* 15.3*  NEUTROABS 16.1*  --   --   HGB 8.6* 8.8* 8.0*  HCT 28.1* 28.7* 25.8*  MCV 96.9 96.0 94.5  PLT 292 286 313   Basic Metabolic Panel: Recent Labs  Lab 01/15/23 2030 01/16/23 0430 01/17/23 0256  NA 132* 136 134*  K 4.3 4.6 3.7  CL 98 99 99  CO2 21* 26 25  GLUCOSE 108* 125* 83  BUN 24* 24* 20  CREATININE 3.86* 3.55* 2.34*  CALCIUM 8.6* 8.7* 8.1*  MG  --  1.7 1.5*  PHOS  --  3.8  --    GFR: Estimated Creatinine Clearance: 31.6 mL/min (A) (by C-G formula based on SCr of 2.34 mg/dL (H)). Liver Function Tests: Recent  Labs  Lab 01/15/23 2030 01/16/23 0430  AST 67* 69*  ALT 26 26  ALKPHOS 94 86  BILITOT 0.6 0.4  PROT 7.2 6.9  ALBUMIN 3.0* 2.7*   No results for input(s): "LIPASE", "AMYLASE" in the last 168 hours. No results for input(s): "AMMONIA" in the last 168 hours. Coagulation Profile: Recent Labs  Lab 01/15/23 2030  INR 1.3*   Cardiac Enzymes: No results for input(s): "CKTOTAL", "CKMB", "CKMBINDEX", "TROPONINI" in the last 168 hours. BNP (last 3 results) No results for input(s): "PROBNP" in the last 8760 hours. HbA1C: Recent Labs    01/16/23 0430  HGBA1C 5.5   CBG: Recent Labs  Lab 01/16/23 0417  GLUCAP 121*   Lipid Profile: No results for input(s): "CHOL", "HDL", "LDLCALC", "TRIG", "CHOLHDL", "LDLDIRECT" in the last 72 hours. Thyroid Function Tests: No results for input(s): "TSH", "T4TOTAL", "FREET4", "T3FREE", "THYROIDAB" in the last 72 hours. Anemia Panel: No results for input(s): "VITAMINB12", "FOLATE", "FERRITIN", "  TIBC", "IRON", "RETICCTPCT" in the last 72 hours. Sepsis Labs: Recent Labs  Lab 01/15/23 2030 01/15/23 2216  PROCALCITON  --  6.43  LATICACIDVEN 2.2* 1.7    Recent Results (from the past 240 hour(s))  Resp panel by RT-PCR (RSV, Flu A&B, Covid) Anterior Nasal Swab     Status: None   Collection Time: 01/15/23  8:09 PM   Specimen: Anterior Nasal Swab  Result Value Ref Range Status   SARS Coronavirus 2 by RT PCR NEGATIVE NEGATIVE Final    Comment: (NOTE) SARS-CoV-2 target nucleic acids are NOT DETECTED.  The SARS-CoV-2 RNA is generally detectable in upper respiratory specimens during the acute phase of infection. The lowest concentration of SARS-CoV-2 viral copies this assay can detect is 138 copies/mL. A negative result does not preclude SARS-Cov-2 infection and should not be used as the sole basis for treatment or other patient management decisions. A negative result may occur with  improper specimen collection/handling, submission of specimen  other than nasopharyngeal swab, presence of viral mutation(s) within the areas targeted by this assay, and inadequate number of viral copies(<138 copies/mL). A negative result must be combined with clinical observations, patient history, and epidemiological information. The expected result is Negative.  Fact Sheet for Patients:  EntrepreneurPulse.com.au  Fact Sheet for Healthcare Providers:  IncredibleEmployment.be  This test is no t yet approved or cleared by the [place] FDA and  has been authorized for detection and/or diagnosis of SARS-CoV-2 by FDA under an Emergency Use Authorization (EUA). This EUA will remain  in effect (meaning this test can be used) for the duration of the COVID-19 declaration under Section 564(b)(1) of the Act, 21 U.S.C.section 360bbb-3(b)(1), unless the authorization is terminated  or revoked sooner.       Influenza A by PCR NEGATIVE NEGATIVE Final   Influenza B by PCR NEGATIVE NEGATIVE Final    Comment: (NOTE) The Xpert Xpress SARS-CoV-2/FLU/RSV plus assay is intended as an aid in the diagnosis of influenza from Nasopharyngeal swab specimens and should not be used as a sole basis for treatment. Nasal washings and aspirates are unacceptable for Xpert Xpress SARS-CoV-2/FLU/RSV testing.  Fact Sheet for Patients: EntrepreneurPulse.com.au  Fact Sheet for Healthcare Providers: IncredibleEmployment.be  This test is not yet approved or cleared by the [place] FDA and has been authorized for detection and/or diagnosis of SARS-CoV-2 by FDA under an Emergency Use Authorization (EUA). This EUA will remain in effect (meaning this test can be used) for the duration of the COVID-19 declaration under Section 564(b)(1) of the Act, 21 U.S.C. section 360bbb-3(b)(1), unless the authorization is terminated or revoked.     Resp Syncytial Virus by PCR NEGATIVE NEGATIVE Final     Comment: (NOTE) Fact Sheet for Patients: EntrepreneurPulse.com.au  Fact Sheet for Healthcare Providers: IncredibleEmployment.be  This test is not yet approved or cleared by the [place] FDA and has been authorized for detection and/or diagnosis of SARS-CoV-2 by FDA under an Emergency Use Authorization (EUA). This EUA will remain in effect (meaning this test can be used) for the duration of the COVID-19 declaration under Section 564(b)(1) of the Act, 21 U.S.C. section 360bbb-3(b)(1), unless the authorization is terminated or revoked.  Performed at [facility], [address] [place], [place] [address]   Blood Culture (routine x 2)     Status: None (Preliminary result)   Collection Time: 01/15/23  8:16 PM   Specimen: BLOOD  Result Value Ref Range Status   Specimen Description BLOOD BLOOD RIGHT HAND  Final  Special Requests   Final    BOTTLES DRAWN AEROBIC ONLY Blood Culture results may not be optimal due to an inadequate volume of blood received in culture bottles   Culture   Final    NO GROWTH 2 DAYS Performed at [facility], [address] [place], [place] [address]    Report Status PENDING  Incomplete  Blood Culture (routine x 2)     Status: None (Preliminary result)   Collection Time: 01/15/23  8:30 PM   Specimen: BLOOD  Result Value Ref Range Status   Specimen Description BLOOD BLOOD LEFT HAND  Final   Special Requests   Final    BOTTLES DRAWN AEROBIC AND ANAEROBIC Blood Culture adequate volume   Culture   Final    NO GROWTH 2 DAYS Performed at [facility], [address], [place], [place] [address]    Report Status PENDING  Incomplete  MRSA Next Gen by PCR, Nasal     Status: Abnormal   Collection Time: 01/16/23  4:05 AM   Specimen: Nasal Mucosa; Nasal Swab  Result Value Ref Range Status   MRSA by PCR Next Gen DETECTED (A) NOT DETECTED Final    Comment: RESULT CALLED TO, READ BACK BY AND VERIFIED WITH: HYLTON L @ 0852 ON  [id number]  BY HENDERSON L (NOTE) The GeneXpert MRSA Assay (FDA approved for NASAL specimens only), is one component of a comprehensive MRSA colonization surveillance program. It is not intended to diagnose MRSA infection nor to guide or monitor treatment for MRSA infections. Test performance is not FDA approved in patients less than 73 years old. Performed at [facility], [address], [place], [place] [address]          Radiology Studies: [facility] 1 View  Result Date: 01/15/2023 CLINICAL DATA:  Questionable sepsis. EXAM: PORTABLE CHEST 1 VIEW COMPARISON:  Chest radiograph and CT dated 03/02/2021. FINDINGS: Scatter pulmonary nodules as seen on the prior CT most consistent with multi lobar pneumonia, possibly atypical in etiology. Clinical correlation and follow-up to resolution recommended. There is slight blunting of the left costophrenic angle which may represent scarring or trace pleural effusion. No pneumothorax. The cardiac silhouette is within normal limits. No acute osseous pathology. IMPRESSION: Multi lobar pneumonia. Clinical correlation and follow-up to resolution recommended. Electronically Signed   By: Elgie Collard M.D.   On: 01/15/2023 20:30        Scheduled Meds:  Chlorhexidine Gluconate Cloth  6 each Topical Daily   dextromethorphan-guaiFENesin  1 tablet Oral BID   feeding supplement  237 mL Oral BID BM   fluticasone furoate-vilanterol  1 puff Inhalation Daily   And   umeclidinium bromide  1 puff Inhalation Daily   folic acid  1 mg Oral Daily   heparin  5,000 Units Subcutaneous Q8H   levothyroxine  88 mcg Oral Daily   midodrine  10 mg Oral TID WC   pantoprazole  40 mg Oral Daily   rosuvastatin  20 mg Oral Daily   sertraline  200 mg Oral Daily   Continuous Infusions:  sodium chloride Stopped (01/17/23 0631)   azithromycin Stopped (01/16/23 2312)   cefTRIAXone (ROCEPHIN)  IV Stopped (01/16/23 2125)   norepinephrine (LEVOPHED) Adult infusion Stopped  (01/16/23 1352)     LOS: 1 day    Time spent: 35 minutes    Jayshawn Colston Hoover Brunette, DO [facility]  If 7PM-7AM, please contact night-coverage [email or web address] 01/17/2023, 12:02 PM

## 2023-01-17 NOTE — Progress Notes (Incomplete)
Pt was taken to the bathroom and is back in bed. Vitals stable.

## 2023-01-17 NOTE — TOC Progression Note (Signed)
Transition of Care [facility]) - Progression Note    Patient Details  Name: Krystal Burgess MRN: [id number] Date of Birth: 1961-10-24  Transition of [facility]) CM/SW Contact  Boneta Lucks, RN Phone Number: 01/17/2023, 3:22 PM  Clinical Narrative:   PT is recommending SNF. Daughter is agreeable. She is not familiar with SNF's. CM sending out for bed offer to be reviewed with Daughter. Started Ship broker. DC planning for Monday.    Expected Discharge Plan: [place] Barriers to Discharge: Continued Medical Work up  Expected Discharge Plan and Services In-house Referral: Clinical Social Work Discharge Planning Services: CM Consult Post Acute Care Choice: [place] Living arrangements for the past 2 months: Single Family Home                    Social Determinants of Health (SDOH) Interventions SDOH Screenings   Food Insecurity: No Food Insecurity (01/17/2023)  Housing: Low Risk  (01/17/2023)  Transportation Needs: No Transportation Needs (01/17/2023)  Utilities: Not At Risk (01/17/2023)  Depression (PHQ2-9): High Risk (12/16/2022)  Tobacco Use: Medium Risk (01/15/2023)    Readmission Risk Interventions    01/17/2023    3:21 PM 01/16/2023   10:07 AM  Readmission Risk Prevention Plan  Transportation Screening Complete Complete  HRI or Home Care Consult  Complete  Social Work Consult for [place] Planning/Counseling  Complete  Palliative Care Screening  Not Applicable  Medication Review Press photographer)  Complete

## 2023-01-17 NOTE — Progress Notes (Signed)
Rounded on patient. Patient states when walked into room that she had soiled herself. Patient with stool. Asked patient how long she had been laying like this, as call bell is next to her and she hadn't called for assistance. Patient cleansed. Asked to please call when she uses the bathroom.

## 2023-01-17 NOTE — Progress Notes (Signed)
Pt was taken to the bathroom and is back in bed. Vitals stable. Pts daughter called and reported to nurse patient contacted her and is hallucinating. When nurse asked patient orientation questions she is completely alert and oriented but speaks of things that aren't really there.  Pt stated "there is a little boy outside of the window with a water hose" "someone is smoking outside of my window". Pt then says "you think I am crazy" and starts laughing.

## 2023-01-18 DIAGNOSIS — A419 Sepsis, unspecified organism: Secondary | ICD-10-CM | POA: Diagnosis not present

## 2023-01-18 DIAGNOSIS — J189 Pneumonia, unspecified organism: Secondary | ICD-10-CM | POA: Diagnosis not present

## 2023-01-18 LAB — BASIC METABOLIC PANEL
Anion gap: 12 (ref 5–15)
BUN: 18 mg/dL (ref 6–20)
CO2: 26 mmol/L (ref 22–32)
Calcium: 8.2 mg/dL — ABNORMAL LOW (ref 8.9–10.3)
Chloride: 96 mmol/L — ABNORMAL LOW (ref 98–111)
Creatinine, Ser: 2.08 mg/dL — ABNORMAL HIGH (ref 0.44–1.00)
GFR, Estimated: 27 mL/min — ABNORMAL LOW (ref >60)
Glucose, Bld: 106 mg/dL — ABNORMAL HIGH (ref 70–99)
Potassium: 3.4 mmol/L — ABNORMAL LOW (ref 3.5–5.1)
Sodium: 134 mmol/L — ABNORMAL LOW (ref 135–145)

## 2023-01-18 LAB — GLUCOSE, CAPILLARY
Glucose-Capillary: 110 mg/dL — ABNORMAL HIGH (ref 70–99)
Glucose-Capillary: 108 mg/dL — ABNORMAL HIGH (ref 70–99)
Glucose-Capillary: 115 mg/dL — ABNORMAL HIGH (ref 70–99)
Glucose-Capillary: 95 mg/dL (ref 70–99)

## 2023-01-18 LAB — CBC
HCT: 27.6 % — ABNORMAL LOW (ref 36.0–46.0)
Hemoglobin: 8.5 g/dL — ABNORMAL LOW (ref 12.0–15.0)
MCH: 29.1 pg (ref 26.0–34.0)
MCHC: 30.8 g/dL (ref 30.0–36.0)
MCV: 94.5 fL (ref 80.0–100.0)
Platelets: 400 10*3/uL (ref 150–400)
RBC: 2.92 MIL/uL — ABNORMAL LOW (ref 3.87–5.11)
RDW: 15.1 % (ref 11.5–15.5)
WBC: 19.4 10*3/uL — ABNORMAL HIGH (ref 4.0–10.5)
nRBC: 0 % (ref 0.0–0.2)

## 2023-01-18 LAB — MAGNESIUM: Magnesium: 1.8 mg/dL (ref 1.7–2.4)

## 2023-01-18 LAB — AMMONIA: Ammonia: 26 umol/L (ref 9–35)

## 2023-01-18 LAB — TSH: TSH: 7.334 u[IU]/mL — ABNORMAL HIGH (ref 0.350–4.500)

## 2023-01-18 MED ORDER — POTASSIUM CHLORIDE CRYS ER 20 MEQ PO TBCR
40.0000 meq | EXTENDED_RELEASE_TABLET | Freq: Once | ORAL | Status: AC
  Administered 2023-01-18: 40 meq via ORAL
  Filled 2023-01-18: qty 2

## 2023-01-18 MED ORDER — AZITHROMYCIN 250 MG PO TABS
250.0000 mg | ORAL_TABLET | Freq: Once | ORAL | Status: AC
  Administered 2023-01-18: 250 mg via ORAL
  Filled 2023-01-18: qty 1

## 2023-01-18 NOTE — Plan of Care (Signed)
  Problem: Education: Goal: Knowledge of General Education information will improve Description: Including pain rating scale, medication(s)/side effects and non-pharmacologic comfort measures Outcome: Progressing   Problem: Health Behavior/Discharge Planning: Goal: Ability to manage health-related needs will improve Outcome: Progressing

## 2023-01-18 NOTE — Progress Notes (Signed)
PROGRESS NOTE    Krystal Burgess  TDD:[id number] DOB: 05/09/1961 DOA: 01/15/2023 PCP: Hali Marry, MD   Brief Narrative:  Krystal Burgess is a 62 y.o. female with medical history significant of COPD on supplemental oxygen at 2 LPM, hypertension, type 2 diabetes mellitus, OSA who presents to the emergency department from home via EMS due to 1 week onset of productive cough of greenish sputum.  She was admitted for sepsis secondary to acute on chronic hypoxemic respiratory failure in the setting of community-acquired pneumonia.  Chest x-ray with multilobar pneumonia noted and she was previously admitted last month with pneumonia issues as well.  She is also noted to have AKI on CKD stage V.  She no longer requires norepinephrine and blood pressures are stable.  PT recommending SNF on discharge.   Assessment & Plan:   Principal Problem:   Sepsis due to pneumonia  [facility]) Active Problems:   Mixed hyperlipidemia   Essential hypertension   Type 2 diabetes mellitus (HCC)   COPD (chronic obstructive pulmonary disease) (HCC)   Obesity, Class III, BMI 40-49.9 (morbid obesity) ([place])   Acute on chronic respiratory failure with hypoxia (HCC)   Acute kidney injury superimposed on chronic kidney disease (HCC)   Lactic acidosis   Elevated troponin   Hypoalbuminemia due to protein-calorie malnutrition (HCC)  Assessment and Plan:  Sepsis secondary to acute on chronic respiratory failure with hypoxia secondary to CAP POA Patient met sepsis criteria due to leukocytosis, fever, tachycardia and tachypnea (met SIRS criteria) with source of infection being the lungs due to multilobar pneumonia Chest x-ray was suggestive of multilobar pneumonia Patient was started on ceftriaxone and azithromycin, we shall continue same at this time  Blood cultures with no growth to date Continue Tylenol as needed Continue Mucinex, incentive spirometry, flutter valve  Wears 2 L at baseline at home Hypotension  resolved with midodrine, continue for now Plan to check cortisol in a.m.   Acute on chronic respiratory failure with hypoxia in the setting of above Supplemental oxygen was increased to 5 LPM to obtain O2 sat of 97 to 98% Continue treatment as described above Wears 2 L nasal cannula at rest at home Currently 4 L nasal cannula   Lactic acidosis-resolved   Hypoalbuminemia secondary to moderate protein calorie malnutrition Albumin 3.0, protein supplement will be provided   Acute kidney injury superimposed on CKD stage V-improving BUN/creatinine 24/3.86 (baseline creatinine at 1.3-1.4) Continue IV hydration Renally adjust medications, avoid nephrotoxic agents/dehydration/hypotension   Elevated troponin possibly secondary to type II demand ischemia Troponin 18 > 14; troponin is flattened out   COPD (not in acute exacerbation) Continue albuterol, DuoNeb, Breo and Incruse elliptica   Essential hypertension BP meds will be held at this time due to soft BP Currently on midodrine for blood pressure support   Mixed hyperlipidemia Statin will be held at this time due to elevated AST   Type 2 diabetes mellitus Patient was not on any T2DM medication per med rec   Obesity class III (BMI 42.76) Continue lifestyle and diet modification  Chronic pain secondary to low back pain/DJD Continue home medications  Hypokalemia Replete and reevaluate in a.m.  Delirium Check TSH as well ammonia levels     DVT prophylaxis: Heparin Code Status: Full Family Communication: Called daughter 1/28 Disposition Plan:  Status is: Inpatient Remains inpatient appropriate because: Need for IV medications.   Consultants:  None  Procedures:  None  Antimicrobials:  Anti-infectives (From admission, onward)    Start  Dose/Rate Route Frequency Ordered Stop   01/15/23 2000  cefTRIAXone (ROCEPHIN) 2 g in sodium chloride 0.9 % 100 mL IVPB        2 g 200 mL/hr over 30 Minutes Intravenous Every 24  hours 01/15/23 1955 01/20/23 1959   01/15/23 2000  azithromycin (ZITHROMAX) 500 mg in sodium chloride 0.9 % 250 mL IVPB        500 mg 250 mL/hr over 60 Minutes Intravenous Every 24 hours 01/15/23 1955 01/20/23 1959       Subjective: Patient seen and evaluated today with complaints of cough and congestion.  No acute overnight events noted.  Blood pressures have improved with use of midodrine.  Objective: Vitals:   01/18/23 0321 01/18/23 0430 01/18/23 0827 01/18/23 0859  BP:  (!) 94/53  (!) 84/52  Pulse:  84  83  Resp:  20  20  Temp:  98.2 F (36.8 C)  98.2 F (36.8 C)  TempSrc:  Oral  Oral  SpO2: 94% 94% 91% 94%  Weight:      Height:        Intake/Output Summary (Last 24 hours) at 01/18/2023 1029 Last data filed at 01/18/2023 0000 Gross per 24 hour  Intake 880 ml  Output --  Net 880 ml   Filed Weights   01/15/23 1951 01/17/23 0457  Weight: 113 kg 111.3 kg    Examination:  General exam: Appears calm and comfortable, morbidly obese Respiratory system: Clear to auscultation. Respiratory effort normal.  3-4 L nasal cannula oxygen Cardiovascular system: S1 & S2 heard, RRR.  Gastrointestinal system: Abdomen is soft Central nervous system: Alert and awake Extremities: No edema Skin: No significant lesions noted Psychiatry: Flat affect.    Data Reviewed: I have personally reviewed following labs and imaging studies  CBC: Recent Labs  Lab 01/15/23 2030 01/16/23 0430 01/17/23 0256 01/18/23 0417  WBC 19.3* 21.9* 15.3* 19.4*  NEUTROABS 16.1*  --   --   --   HGB 8.6* 8.8* 8.0* 8.5*  HCT 28.1* 28.7* 25.8* 27.6*  MCV 96.9 96.0 94.5 94.5  PLT 292 286 313 299   Basic Metabolic Panel: Recent Labs  Lab 01/15/23 2030 01/16/23 0430 01/17/23 0256 01/18/23 0417  NA 132* 136 134* 134*  K 4.3 4.6 3.7 3.4*  CL 98 99 99 96*  CO2 21* 26 25 26   GLUCOSE 108* 125* 83 106*  BUN 24* 24* 20 18  CREATININE 3.86* 3.55* 2.34* 2.08*  CALCIUM 8.6* 8.7* 8.1* 8.2*  MG  --  1.7  1.5* 1.8  PHOS  --  3.8  --   --    GFR: Estimated Creatinine Clearance: 35.5 mL/min (A) (by C-G formula based on SCr of 2.08 mg/dL (H)). Liver Function Tests: Recent Labs  Lab 01/15/23 2030 01/16/23 0430  AST 67* 69*  ALT 26 26  ALKPHOS 94 86  BILITOT 0.6 0.4  PROT 7.2 6.9  ALBUMIN 3.0* 2.7*   No results for input(s): "LIPASE", "AMYLASE" in the last 168 hours. No results for input(s): "AMMONIA" in the last 168 hours. Coagulation Profile: Recent Labs  Lab 01/15/23 2030  INR 1.3*   Cardiac Enzymes: No results for input(s): "CKTOTAL", "CKMB", "CKMBINDEX", "TROPONINI" in the last 168 hours. BNP (last 3 results) No results for input(s): "PROBNP" in the last 8760 hours. HbA1C: Recent Labs    01/16/23 0430  HGBA1C 5.5   CBG: Recent Labs  Lab 01/16/23 0417 01/18/23 0820  GLUCAP 121* 110*   Lipid Profile: No results for input(s): "  CHOL", "HDL", "LDLCALC", "TRIG", "CHOLHDL", "LDLDIRECT" in the last 72 hours. Thyroid Function Tests: No results for input(s): "TSH", "T4TOTAL", "FREET4", "T3FREE", "THYROIDAB" in the last 72 hours. Anemia Panel: No results for input(s): "VITAMINB12", "FOLATE", "FERRITIN", "TIBC", "IRON", "RETICCTPCT" in the last 72 hours. Sepsis Labs: Recent Labs  Lab 01/15/23 2030 01/15/23 2216  PROCALCITON  --  6.43  LATICACIDVEN 2.2* 1.7    Recent Results (from the past 240 hour(s))  Resp panel by RT-PCR (RSV, Flu A&B, Covid) Anterior Nasal Swab     Status: None   Collection Time: 01/15/23  8:09 PM   Specimen: Anterior Nasal Swab  Result Value Ref Range Status   SARS Coronavirus 2 by RT PCR NEGATIVE NEGATIVE Final    Comment: (NOTE) SARS-CoV-2 target nucleic acids are NOT DETECTED.  The SARS-CoV-2 RNA is generally detectable in upper respiratory specimens during the acute phase of infection. The lowest concentration of SARS-CoV-2 viral copies this assay can detect is 138 copies/mL. A negative result does not preclude SARS-Cov-2 infection and  should not be used as the sole basis for treatment or other patient management decisions. A negative result may occur with  improper specimen collection/handling, submission of specimen other than nasopharyngeal swab, presence of viral mutation(s) within the areas targeted by this assay, and inadequate number of viral copies(<138 copies/mL). A negative result must be combined with clinical observations, patient history, and epidemiological information. The expected result is Negative.  Fact Sheet for Patients:  BloggerCourse.com  Fact Sheet for Healthcare Providers:  SeriousBroker.it  This test is no t yet approved or cleared by the [place] FDA and  has been authorized for detection and/or diagnosis of SARS-CoV-2 by FDA under an Emergency Use Authorization (EUA). This EUA will remain  in effect (meaning this test can be used) for the duration of the COVID-19 declaration under Section 564(b)(1) of the Act, 21 U.S.C.section 360bbb-3(b)(1), unless the authorization is terminated  or revoked sooner.       Influenza A by PCR NEGATIVE NEGATIVE Final   Influenza B by PCR NEGATIVE NEGATIVE Final    Comment: (NOTE) The Xpert Xpress SARS-CoV-2/FLU/RSV plus assay is intended as an aid in the diagnosis of influenza from Nasopharyngeal swab specimens and should not be used as a sole basis for treatment. Nasal washings and aspirates are unacceptable for Xpert Xpress SARS-CoV-2/FLU/RSV testing.  Fact Sheet for Patients: BloggerCourse.com  Fact Sheet for Healthcare Providers: SeriousBroker.it  This test is not yet approved or cleared by the [place] FDA and has been authorized for detection and/or diagnosis of SARS-CoV-2 by FDA under an Emergency Use Authorization (EUA). This EUA will remain in effect (meaning this test can be used) for the duration of the COVID-19 declaration  under Section 564(b)(1) of the Act, 21 U.S.C. section 360bbb-3(b)(1), unless the authorization is terminated or revoked.     Resp Syncytial Virus by PCR NEGATIVE NEGATIVE Final    Comment: (NOTE) Fact Sheet for Patients: BloggerCourse.com  Fact Sheet for Healthcare Providers: SeriousBroker.it  This test is not yet approved or cleared by the [place] FDA and has been authorized for detection and/or diagnosis of SARS-CoV-2 by FDA under an Emergency Use Authorization (EUA). This EUA will remain in effect (meaning this test can be used) for the duration of the COVID-19 declaration under Section 564(b)(1) of the Act, 21 U.S.C. section 360bbb-3(b)(1), unless the authorization is terminated or revoked.  Performed at [facility], [address], [place], [place] [address]   Blood Culture (routine x 2)  Status: None (Preliminary result)   Collection Time: 01/15/23  8:16 PM   Specimen: BLOOD  Result Value Ref Range Status   Specimen Description BLOOD BLOOD RIGHT HAND  Final   Special Requests   Final    BOTTLES DRAWN AEROBIC ONLY Blood Culture results may not be optimal due to an inadequate volume of blood received in culture bottles   Culture   Final    NO GROWTH 3 DAYS Performed at [facility], [address] [place], [place] [address]    Report Status PENDING  Incomplete  Blood Culture (routine x 2)     Status: None (Preliminary result)   Collection Time: 01/15/23  8:30 PM   Specimen: BLOOD  Result Value Ref Range Status   Specimen Description BLOOD BLOOD LEFT HAND  Final   Special Requests   Final    BOTTLES DRAWN AEROBIC AND ANAEROBIC Blood Culture adequate volume   Culture   Final    NO GROWTH 3 DAYS Performed at [facility], [address], [place], [place] [address]    Report Status PENDING  Incomplete  MRSA Next Gen by PCR, Nasal     Status: Abnormal   Collection Time: 01/16/23  4:05 AM   Specimen: Nasal Mucosa;  Nasal Swab  Result Value Ref Range Status   MRSA by PCR Next Gen DETECTED (A) NOT DETECTED Final    Comment: RESULT CALLED TO, READ BACK BY AND VERIFIED WITH: HYLTON L @ [id number] ON [id number]  BY HENDERSON L (NOTE) The GeneXpert MRSA Assay (FDA approved for NASAL specimens only), is one component of a comprehensive MRSA colonization surveillance program. It is not intended to diagnose MRSA infection nor to guide or monitor treatment for MRSA infections. Test performance is not FDA approved in patients less than 45 years old. Performed at [facility], [address] [place], [place] [address]          Radiology Studies: No results found.      Scheduled Meds:  Chlorhexidine Gluconate Cloth  6 each Topical Daily   dextromethorphan-guaiFENesin  1 tablet Oral BID   feeding supplement  237 mL Oral BID BM   fluticasone furoate-vilanterol  1 puff Inhalation Daily   And   umeclidinium bromide  1 puff Inhalation Daily   folic acid  1 mg Oral Daily   heparin  5,000 Units Subcutaneous Q8H   levothyroxine  88 mcg Oral Daily   midodrine  10 mg Oral TID WC   pantoprazole  40 mg Oral Daily   rosuvastatin  20 mg Oral Daily   sertraline  200 mg Oral Daily   Continuous Infusions:  sodium chloride Stopped (01/17/23 0631)   azithromycin 500 mg (01/17/23 2114)   cefTRIAXone (ROCEPHIN)  IV 2 g (01/17/23 2223)   norepinephrine (LEVOPHED) Adult infusion Stopped (01/16/23 1352)     LOS: 2 days    Time spent: 35 minutes    Itai Barbian Hoover Brunette, DO [facility]  If 7PM-7AM, please contact night-coverage [email or web address] 01/18/2023, 10:29 AM

## 2023-01-19 ENCOUNTER — Other Ambulatory Visit: Payer: Self-pay | Admitting: Family Medicine

## 2023-01-19 DIAGNOSIS — J189 Pneumonia, unspecified organism: Secondary | ICD-10-CM | POA: Diagnosis not present

## 2023-01-19 DIAGNOSIS — A419 Sepsis, unspecified organism: Secondary | ICD-10-CM | POA: Diagnosis not present

## 2023-01-19 LAB — CBC
HCT: 27.8 % — ABNORMAL LOW (ref 36.0–46.0)
Hemoglobin: 8.2 g/dL — ABNORMAL LOW (ref 12.0–15.0)
MCH: 28.4 pg (ref 26.0–34.0)
MCHC: 29.5 g/dL — ABNORMAL LOW (ref 30.0–36.0)
MCV: 96.2 fL (ref 80.0–100.0)
Platelets: 445 10*3/uL — ABNORMAL HIGH (ref 150–400)
RBC: 2.89 MIL/uL — ABNORMAL LOW (ref 3.87–5.11)
RDW: 15.4 % (ref 11.5–15.5)
WBC: 19.3 10*3/uL — ABNORMAL HIGH (ref 4.0–10.5)
nRBC: 0 % (ref 0.0–0.2)

## 2023-01-19 LAB — BASIC METABOLIC PANEL
Anion gap: 11 (ref 5–15)
BUN: 15 mg/dL (ref 6–20)
CO2: 27 mmol/L (ref 22–32)
Calcium: 8.3 mg/dL — ABNORMAL LOW (ref 8.9–10.3)
Chloride: 100 mmol/L (ref 98–111)
Creatinine, Ser: 2.13 mg/dL — ABNORMAL HIGH (ref 0.44–1.00)
GFR, Estimated: 26 mL/min — ABNORMAL LOW (ref >60)
Glucose, Bld: 97 mg/dL (ref 70–99)
Potassium: 3.8 mmol/L (ref 3.5–5.1)
Sodium: 138 mmol/L (ref 135–145)

## 2023-01-19 LAB — T4, FREE: Free T4: 0.93 ng/dL (ref 0.61–1.12)

## 2023-01-19 LAB — MAGNESIUM: Magnesium: 1.9 mg/dL (ref 1.7–2.4)

## 2023-01-19 MED ORDER — AZITHROMYCIN 250 MG PO TABS
500.0000 mg | ORAL_TABLET | Freq: Once | ORAL | Status: AC
  Administered 2023-01-19: 500 mg via ORAL
  Filled 2023-01-19: qty 2

## 2023-01-19 MED ORDER — IPRATROPIUM-ALBUTEROL 0.5-2.5 (3) MG/3ML IN SOLN
3.0000 mL | Freq: Three times a day (TID) | RESPIRATORY_TRACT | Status: DC
  Administered 2023-01-19 – 2023-01-20 (×5): 3 mL via RESPIRATORY_TRACT
  Filled 2023-01-19 (×4): qty 3

## 2023-01-19 MED ORDER — MUPIROCIN 2 % EX OINT
1.0000 | TOPICAL_OINTMENT | Freq: Two times a day (BID) | CUTANEOUS | Status: DC
  Administered 2023-01-19 – 2023-01-20 (×3): 1 via NASAL
  Filled 2023-01-19: qty 22

## 2023-01-19 NOTE — TOC Progression Note (Addendum)
Transition of Care [facility]) - Progression Note    Patient Details  Name: Krystal Burgess MRN: [id number] Date of Birth: 02-25-1961  Transition of Care [facility]) CM/SW Contact  Boneta Lucks, RN Phone Number: 01/19/2023, 11:19 AM  Clinical Narrative:   Discussed bed offers with Daughter, She is wanting [facility]. CM updated [employer] with bed, they will have INS Auth shortly, with start of care tomorrow.   Expected Discharge Plan: [place] Barriers to Discharge: Continued Medical Work up  Expected Discharge Plan and Services In-house Referral: Clinical Social Work Discharge Planning Services: CM Consult Post Acute Care Choice: [place] Living arrangements for the past 2 months: Single Family Home                  Social Determinants of Health (SDOH) Interventions SDOH Screenings   Food Insecurity: No Food Insecurity (01/17/2023)  Housing: Low Risk  (01/17/2023)  Transportation Needs: No Transportation Needs (01/17/2023)  Utilities: Not At Risk (01/17/2023)  Depression (PHQ2-9): High Risk (12/16/2022)  Tobacco Use: Medium Risk (01/15/2023)    Readmission Risk Interventions    01/17/2023    3:21 PM 01/16/2023   10:07 AM  Readmission Risk Prevention Plan  Transportation Screening Complete Complete  HRI or Home Care Consult  Complete  Social Work Consult for [place] Planning/Counseling  Complete  Palliative Care Screening  Not Applicable  Medication Review Press photographer)  Complete

## 2023-01-19 NOTE — Progress Notes (Signed)
Pt no longer has IV access, pt refused to be stuck again. IV ultrasound was brought up and they informed pt they could go in the ac, however she did not like the location. Education provided on importance of maintaining IV access for IV antibiotics. MD notified, antibiotics changed to PO. Azithromycin tablet given as ordered. Pt slept through the night. Vitals stable.

## 2023-01-19 NOTE — Progress Notes (Signed)
PROGRESS NOTE    Krystal Burgess  ZOX:[id number] DOB: 12/11/1961 DOA: 01/15/2023 PCP: Hali Marry, MD   Brief Narrative:  Krystal Burgess is a 62 y.o. female with medical history significant of COPD on supplemental oxygen at 2 LPM, hypertension, type 2 diabetes mellitus, OSA who presents to the emergency department from home via EMS due to 1 week onset of productive cough of greenish sputum.  She was admitted for sepsis secondary to acute on chronic hypoxemic respiratory failure in the setting of community-acquired pneumonia.  Chest x-ray with multilobar pneumonia noted and she was previously admitted last month with pneumonia issues as well.  She is also noted to have AKI on CKD stage V.  She no longer requires norepinephrine and blood pressures are stable.  PT recommending SNF on discharge.   Assessment & Plan:   Principal Problem:   Sepsis due to pneumonia [facility]) Active Problems:   Mixed hyperlipidemia   Essential hypertension   Type 2 diabetes mellitus (HCC)   COPD (chronic obstructive pulmonary disease) (HCC)   Obesity, Class III, BMI 40-49.9 (morbid obesity) (HCC)   Acute on chronic respiratory failure with hypoxia (HCC)   Acute kidney injury superimposed on chronic kidney disease (HCC)   Lactic acidosis   Elevated troponin   Hypoalbuminemia due to protein-calorie malnutrition (HCC)  Assessment and Plan:  Sepsis secondary to acute on chronic respiratory failure with hypoxia secondary to CAP POA Patient met sepsis criteria due to leukocytosis, fever, tachycardia and tachypnea (met SIRS criteria) with source of infection being the lungs due to multilobar pneumonia Chest x-ray was suggestive of multilobar pneumonia Patient was started on ceftriaxone and azithromycin, we shall continue same at this time  Blood cultures with no growth to date Continue Tylenol as needed Continue Mucinex, incentive spirometry, flutter valve  Wears 2 L at baseline at home Hypotension  resolved with midodrine, continue for now Cortisol pending and TSH elevated   Acute on chronic respiratory failure with hypoxia in the setting of above Supplemental oxygen was increased to 5 LPM to obtain O2 sat of 97 to 98% Continue treatment as described above Wears 2 L nasal cannula at rest at home Currently 4 L nasal cannula   Lactic acidosis-resolved   Hypoalbuminemia secondary to moderate protein calorie malnutrition Albumin 3.0, protein supplement will be provided   Acute kidney injury superimposed on CKD stage V-improving BUN/creatinine 24/3.86 (baseline creatinine at 1.3-1.4) Continue IV hydration Renally adjust medications, avoid nephrotoxic agents/dehydration/hypotension   Elevated troponin possibly secondary to type II demand ischemia Troponin 18 > 14; troponin is flattened out   COPD (not in acute exacerbation) Continue albuterol, DuoNeb, Breo and Incruse elliptica   Essential hypertension BP meds will be held at this time due to soft BP Currently on midodrine for blood pressure support   Mixed hyperlipidemia Statin will be held at this time due to elevated AST   Type 2 diabetes mellitus Patient was not on any T2DM medication per med rec   Obesity class III (BMI 42.76) Continue lifestyle and diet modification  Chronic pain secondary to low back pain/DJD Continue home medications  Delirium Ammonia within normal limits TSH elevated at 7.334, check free T4 and adjust levothyroxine dosage as needed    DVT prophylaxis: Heparin Code Status: Full Family Communication: Called daughter 1/29 Disposition Plan:  Status is: Inpatient Remains inpatient appropriate because: Need for IV medications.   Consultants:  None  Procedures:  None  Antimicrobials:  Anti-infectives (From admission, onward)  Start     Dose/Rate Route Frequency Ordered Stop   01/19/23 0030  azithromycin (ZITHROMAX) tablet 250 mg        250 mg Oral  Once 01/18/23 2339 01/18/23 2356    01/15/23 2000  cefTRIAXone (ROCEPHIN) 2 g in sodium chloride 0.9 % 100 mL IVPB        2 g 200 mL/hr over 30 Minutes Intravenous Every 24 hours 01/15/23 1955 01/20/23 1959   01/15/23 2000  azithromycin (ZITHROMAX) 500 mg in sodium chloride 0.9 % 250 mL IVPB        500 mg 250 mL/hr over 60 Minutes Intravenous Every 24 hours 01/15/23 1955 01/20/23 1959       Subjective: Patient seen and evaluated today with complaints of cough and congestion.  No acute overnight events noted.  Blood pressures have improved with use of midodrine.  Objective: Vitals:   01/18/23 2010 01/18/23 2139 01/19/23 0542 01/19/23 0710  BP:  (!) 127/57 126/72   Pulse:  89 93 85  Resp:  20  20  Temp:  98.2 F (36.8 C) 98.4 F (36.9 C)   TempSrc:  Oral Oral   SpO2: 97% 98% 93% 95%  Weight:      Height:        Intake/Output Summary (Last 24 hours) at 01/19/2023 1054 Last data filed at 01/19/2023 0500 Gross per 24 hour  Intake 120 ml  Output 1501 ml  Net -1381 ml   Filed Weights   01/15/23 1951 01/17/23 0457  Weight: 113 kg 111.3 kg    Examination:  General exam: Appears calm and comfortable, morbidly obese Respiratory system: Clear to auscultation. Respiratory effort normal.  4-5 L nasal cannula oxygen Cardiovascular system: S1 & S2 heard, RRR.  Gastrointestinal system: Abdomen is soft Central nervous system: Alert and awake Extremities: No edema Skin: No significant lesions noted Psychiatry: Flat affect.    Data Reviewed: I have personally reviewed following labs and imaging studies  CBC: Recent Labs  Lab 01/15/23 2030 01/16/23 0430 01/17/23 0256 01/18/23 0417 01/19/23 0528  WBC 19.3* 21.9* 15.3* 19.4* 19.3*  NEUTROABS 16.1*  --   --   --   --   HGB 8.6* 8.8* 8.0* 8.5* 8.2*  HCT 28.1* 28.7* 25.8* 27.6* 27.8*  MCV 96.9 96.0 94.5 94.5 96.2  PLT 292 286 313 400 445*   Basic Metabolic Panel: Recent Labs  Lab 01/15/23 2030 01/16/23 0430 01/17/23 0256 01/18/23 0417 01/19/23 0528   NA 132* 136 134* 134* 138  K 4.3 4.6 3.7 3.4* 3.8  CL 98 99 99 96* 100  CO2 21* 26 25 26 27   GLUCOSE 108* 125* 83 106* 97  BUN 24* 24* 20 18 15   CREATININE 3.86* 3.55* 2.34* 2.08* 2.13*  CALCIUM 8.6* 8.7* 8.1* 8.2* 8.3*  MG  --  1.7 1.5* 1.8 1.9  PHOS  --  3.8  --   --   --    GFR: Estimated Creatinine Clearance: 34.7 mL/min (A) (by C-G formula based on SCr of 2.13 mg/dL (H)). Liver Function Tests: Recent Labs  Lab 01/15/23 2030 01/16/23 0430  AST 67* 69*  ALT 26 26  ALKPHOS 94 86  BILITOT 0.6 0.4  PROT 7.2 6.9  ALBUMIN 3.0* 2.7*   No results for input(s): "LIPASE", "AMYLASE" in the last 168 hours. Recent Labs  Lab 01/18/23 1135  AMMONIA 26   Coagulation Profile: Recent Labs  Lab 01/15/23 2030  INR 1.3*   Cardiac Enzymes: No results for input(s): "CKTOTAL", "  CKMB", "CKMBINDEX", "TROPONINI" in the last 168 hours. BNP (last 3 results) No results for input(s): "PROBNP" in the last 8760 hours. HbA1C: No results for input(s): "HGBA1C" in the last 72 hours.  CBG: Recent Labs  Lab 01/16/23 0417 01/18/23 0820 01/18/23 1149 01/18/23 1618 01/18/23 2138  GLUCAP 121* 110* 108* 115* 95   Lipid Profile: No results for input(s): "CHOL", "HDL", "LDLCALC", "TRIG", "CHOLHDL", "LDLDIRECT" in the last 72 hours. Thyroid Function Tests: Recent Labs    01/18/23 1135  TSH 7.334*   Anemia Panel: No results for input(s): "VITAMINB12", "FOLATE", "FERRITIN", "TIBC", "IRON", "RETICCTPCT" in the last 72 hours. Sepsis Labs: Recent Labs  Lab 01/15/23 2030 01/15/23 2216  PROCALCITON  --  6.43  LATICACIDVEN 2.2* 1.7    Recent Results (from the past 240 hour(s))  Resp panel by RT-PCR (RSV, Flu A&B, Covid) Anterior Nasal Swab     Status: None   Collection Time: 01/15/23  8:09 PM   Specimen: Anterior Nasal Swab  Result Value Ref Range Status   SARS Coronavirus 2 by RT PCR NEGATIVE NEGATIVE Final    Comment: (NOTE) SARS-CoV-2 target nucleic acids are NOT DETECTED.  The  SARS-CoV-2 RNA is generally detectable in upper respiratory specimens during the acute phase of infection. The lowest concentration of SARS-CoV-2 viral copies this assay can detect is 138 copies/mL. A negative result does not preclude SARS-Cov-2 infection and should not be used as the sole basis for treatment or other patient management decisions. A negative result may occur with  improper specimen collection/handling, submission of specimen other than nasopharyngeal swab, presence of viral mutation(s) within the areas targeted by this assay, and inadequate number of viral copies(<138 copies/mL). A negative result must be combined with clinical observations, patient history, and epidemiological information. The expected result is Negative.  Fact Sheet for Patients:  EntrepreneurPulse.com.au  Fact Sheet for Healthcare Providers:  IncredibleEmployment.be  This test is no t yet approved or cleared by the [place] FDA and  has been authorized for detection and/or diagnosis of SARS-CoV-2 by FDA under an Emergency Use Authorization (EUA). This EUA will remain  in effect (meaning this test can be used) for the duration of the COVID-19 declaration under Section 564(b)(1) of the Act, 21 U.S.C.section 360bbb-3(b)(1), unless the authorization is terminated  or revoked sooner.       Influenza A by PCR NEGATIVE NEGATIVE Final   Influenza B by PCR NEGATIVE NEGATIVE Final    Comment: (NOTE) The Xpert Xpress SARS-CoV-2/FLU/RSV plus assay is intended as an aid in the diagnosis of influenza from Nasopharyngeal swab specimens and should not be used as a sole basis for treatment. Nasal washings and aspirates are unacceptable for Xpert Xpress SARS-CoV-2/FLU/RSV testing.  Fact Sheet for Patients: EntrepreneurPulse.com.au  Fact Sheet for Healthcare Providers: IncredibleEmployment.be  This test is not yet approved or  cleared by the [place] FDA and has been authorized for detection and/or diagnosis of SARS-CoV-2 by FDA under an Emergency Use Authorization (EUA). This EUA will remain in effect (meaning this test can be used) for the duration of the COVID-19 declaration under Section 564(b)(1) of the Act, 21 U.S.C. section 360bbb-3(b)(1), unless the authorization is terminated or revoked.     Resp Syncytial Virus by PCR NEGATIVE NEGATIVE Final    Comment: (NOTE) Fact Sheet for Patients: EntrepreneurPulse.com.au  Fact Sheet for Healthcare Providers: IncredibleEmployment.be  This test is not yet approved or cleared by the [place] FDA and has been authorized for detection and/or diagnosis of SARS-CoV-2 by  FDA under an Emergency Use Authorization (EUA). This EUA will remain in effect (meaning this test can be used) for the duration of the COVID-19 declaration under Section 564(b)(1) of the Act, 21 U.S.C. section 360bbb-3(b)(1), unless the authorization is terminated or revoked.  Performed at [facility], [address] [place], [place] [address]   Blood Culture (routine x 2)     Status: None (Preliminary result)   Collection Time: 01/15/23  8:16 PM   Specimen: BLOOD  Result Value Ref Range Status   Specimen Description BLOOD BLOOD RIGHT HAND  Final   Special Requests   Final    BOTTLES DRAWN AEROBIC ONLY Blood Culture results may not be optimal due to an inadequate volume of blood received in culture bottles   Culture   Final    NO GROWTH 4 DAYS Performed at [facility], [address] [place], [place] [address]    Report Status PENDING  Incomplete  Blood Culture (routine x 2)     Status: None (Preliminary result)   Collection Time: 01/15/23  8:30 PM   Specimen: BLOOD  Result Value Ref Range Status   Specimen Description BLOOD BLOOD LEFT HAND  Final   Special Requests   Final    BOTTLES DRAWN AEROBIC AND ANAEROBIC Blood Culture adequate volume    Culture   Final    NO GROWTH 4 DAYS Performed at [facility], [address], [place], [place] [address]    Report Status PENDING  Incomplete  MRSA Next Gen by PCR, Nasal     Status: Abnormal   Collection Time: 01/16/23  4:05 AM   Specimen: Nasal Mucosa; Nasal Swab  Result Value Ref Range Status   MRSA by PCR Next Gen DETECTED (A) NOT DETECTED Final    Comment: RESULT CALLED TO, READ BACK BY AND VERIFIED WITH: HYLTON L @ [id number] ON [id number]  BY HENDERSON L (NOTE) The GeneXpert MRSA Assay (FDA approved for NASAL specimens only), is one component of a comprehensive MRSA colonization surveillance program. It is not intended to diagnose MRSA infection nor to guide or monitor treatment for MRSA infections. Test performance is not FDA approved in patients less than 76 years old. Performed at [facility], [address] [place], [place] [address]          Radiology Studies: No results found.      Scheduled Meds:  Chlorhexidine Gluconate Cloth  6 each Topical Daily   dextromethorphan-guaiFENesin  1 tablet Oral BID   feeding supplement  237 mL Oral BID BM   fluticasone furoate-vilanterol  1 puff Inhalation Daily   And   umeclidinium bromide  1 puff Inhalation Daily   folic acid  1 mg Oral Daily   heparin  5,000 Units Subcutaneous Q8H   ipratropium-albuterol  3 mL Nebulization TID   levothyroxine  88 mcg Oral Daily   midodrine  10 mg Oral TID WC   pantoprazole  40 mg Oral Daily   rosuvastatin  20 mg Oral Daily   sertraline  200 mg Oral Daily   Continuous Infusions:  sodium chloride Stopped (01/17/23 0631)   azithromycin 500 mg (01/18/23 2054)   cefTRIAXone (ROCEPHIN)  IV 2 g (01/18/23 1957)   norepinephrine (LEVOPHED) Adult infusion Stopped (01/16/23 1352)     LOS: 3 days    Time spent: 35 minutes    Vasilis Luhman Hoover Brunette, DO [facility]  If 7PM-7AM, please contact night-coverage [email or web address] 01/19/2023, 10:54 AM

## 2023-01-19 NOTE — Care Management Important Message (Signed)
Important Message  Patient Details  Name: Krystal Burgess MRN: [id number] Date of Birth: March 20, 1961   Medicare Important Message Given:  Yes     Tommy Medal 01/19/2023, 10:40 AM

## 2023-01-19 NOTE — Plan of Care (Signed)
  Problem: Education: Goal: Knowledge of General Education information will improve Description: Including pain rating scale, medication(s)/side effects and non-pharmacologic comfort measures Outcome: Progressing   Problem: Health Behavior/Discharge Planning: Goal: Ability to manage health-related needs will improve Outcome: Progressing

## 2023-01-19 NOTE — Progress Notes (Signed)
Patient requested visit in order to talk about Advanced Directives today. Chaplain found patient in hospital bed with eyes closed. She woke and welcomed Chaplain warmly to her bedside. We spoke about what an Advanced Directive was and I answered her questions. She shared that she would like to take the paperwork and speak to her daughter about these things. I left the paperwork with her.  During our conversation she shared how it has been hard to loose control of her body over the course of the last few months and how her illness has impacted this. Chaplain provided compassionate and reflective listening, encouragement, and prayer. Will continue to follow in order to provide spiritual assessment and spiritual support.   Rev. Bennie Pierini, M.Div Chaplain

## 2023-01-20 DIAGNOSIS — A419 Sepsis, unspecified organism: Secondary | ICD-10-CM | POA: Diagnosis not present

## 2023-01-20 DIAGNOSIS — J189 Pneumonia, unspecified organism: Secondary | ICD-10-CM | POA: Diagnosis not present

## 2023-01-20 LAB — CBC
HCT: 27.8 % — ABNORMAL LOW (ref 36.0–46.0)
Hemoglobin: 8.3 g/dL — ABNORMAL LOW (ref 12.0–15.0)
MCH: 29.5 pg (ref 26.0–34.0)
MCHC: 29.9 g/dL — ABNORMAL LOW (ref 30.0–36.0)
MCV: 98.9 fL (ref 80.0–100.0)
Platelets: 463 K/uL — ABNORMAL HIGH (ref 150–400)
RBC: 2.81 MIL/uL — ABNORMAL LOW (ref 3.87–5.11)
RDW: 15.7 % — ABNORMAL HIGH (ref 11.5–15.5)
WBC: 17.5 K/uL — ABNORMAL HIGH (ref 4.0–10.5)
nRBC: 0 % (ref 0.0–0.2)

## 2023-01-20 LAB — BASIC METABOLIC PANEL WITH GFR
Anion gap: 8 (ref 5–15)
BUN: 15 mg/dL (ref 6–20)
CO2: 29 mmol/L (ref 22–32)
Calcium: 8.4 mg/dL — ABNORMAL LOW (ref 8.9–10.3)
Chloride: 102 mmol/L (ref 98–111)
Creatinine, Ser: 2.24 mg/dL — ABNORMAL HIGH (ref 0.44–1.00)
GFR, Estimated: 25 mL/min — ABNORMAL LOW
Glucose, Bld: 103 mg/dL — ABNORMAL HIGH (ref 70–99)
Potassium: 4.1 mmol/L (ref 3.5–5.1)
Sodium: 139 mmol/L (ref 135–145)

## 2023-01-20 LAB — CULTURE, BLOOD (ROUTINE X 2)
Culture: NO GROWTH
Culture: NO GROWTH
Special Requests: ADEQUATE

## 2023-01-20 LAB — MAGNESIUM: Magnesium: 1.9 mg/dL (ref 1.7–2.4)

## 2023-01-20 LAB — CORTISOL-AM, BLOOD: Cortisol - AM: 11.3 ug/dL (ref 6.7–22.6)

## 2023-01-20 MED ORDER — MIDODRINE HCL 10 MG PO TABS: 10.0000 mg | ORAL_TABLET | Freq: Three times a day (TID) | ORAL | 0 refills | Status: AC

## 2023-01-20 NOTE — TOC Transition Note (Signed)
Transition of Care [facility]) - CM/SW Discharge Note   Patient Details  Name: Krystal Burgess MRN: [id number] Date of Birth: 05-11-1961  Transition of Care [facility]) CM/SW Contact:  Boneta Lucks, RN Phone Number: 01/20/2023, 11:14 AM   Clinical Narrative:   Patient is medically ready for discharge, She is refusing to go to [facility], wanting to go home. MD discusses with Daughter. She is agreeable to discharge with HHPT/RN and outpatient Palliative.  UHC insurance, Marjory Lies with [employer] accepted. Added to AVS. RN updated everything is set up for her to discharge home. RN calling Daughter for transport.     Final next level of care: Home w Home Health Services Barriers to Discharge: Barriers Resolved   Patient Goals and CMS Choice CMS Medicare.gov Compare Post Acute Care list provided to:: Patient Represenative (must comment) Choice offered to / list presented to : Patient, Adult Children  Discharge Placement      Name of family member notified: Daughter Patient and family notified of of transfer: 01/20/23  Discharge Plan and Services Additional resources added to the After Visit Summary for   In-house Referral: Clinical Social Work Discharge Planning Services: CM Consult Post Acute Care Choice: [place]: [place] Date [place]: 01/20/23 Time HH Agency Contacted: [phone] Representative spoke with at [place]: [place] Determinants of Health ([place]) Interventions SDOH Screenings   Food Insecurity: No Food Insecurity (01/17/2023)  Housing: Low Risk  (01/17/2023)  Transportation Needs: No Transportation Needs (01/17/2023)  Utilities: Not At Risk (01/17/2023)  Depression (PHQ2-9): High Risk (12/16/2022)  Tobacco Use: Medium Risk (01/15/2023)    Readmission Risk Interventions    01/17/2023    3:21 PM 01/16/2023   10:07 AM  Readmission Risk Prevention Plan  Transportation Screening Complete Complete  HRI or  [place]  Complete  Social Work Consult for [place] Planning/Counseling  Complete  Palliative Care Screening  Not Applicable  Medication Review Press photographer)  Complete

## 2023-01-20 NOTE — Progress Notes (Signed)
Pt reported 8/10 pain this am in back, PRN oxycodone given. Pt slept through the night. BP low, other vitals stable. Pt still continues to refuse IV access despite education attempts on importance of IV antibiotics. MD made aware, PO Azithromycin ordered and given. Expiratory wheezes auscultated in upper and lower lobes. Pt still having a productive cough. Pt has had multiple bowel movements during the night.

## 2023-01-20 NOTE — Progress Notes (Signed)
Physical Therapy Treatment Patient Details Name: Krystal Burgess MRN: [id number] DOB: 01-21-1961 Today's Date: 01/20/2023   History of Present Illness Krystal Burgess is a 62 y.o. female with medical history significant of COPD on supplemental oxygen at 2 LPM, hypertension, type 2 diabetes mellitus, OSA who presents to the emergency department from home via EMS due to 1 week onset of productive cough of greenish sputum.  Patient was admitted to [facility] from 12/20 to 12/18 due to multifocal pneumonia which was azithromycin and Rocephin and was also initially treated with IV vancomycin due to MRSA PCR being positive, but this was eventually changed to linezolid due to worsening kidney function at that time and was discharged home with an oral form of the medication along with cefdinir.  Patient states that her breathing never returned to baseline since being discharged from the hospital, she followed up with her PCP who encouraged her to complete the prescribed medication on discharge after which she improved.  She had a virtual visit via telephone on 1/24 with a PCP due to 1 week onset of worsening cough with production of green sputum and requiring daily use of inhaler and possible subjective fever.  Home COVID test done was negative.  Apparently, on further questioning, it was realized that patient was exposed to son in law who came home sick.  She denies vomiting, abdominal pain, diarrhea, blurred vision.    PT Comments    Pt was limited today's treatment session, due to increased fatigue and pain, pt was wiling to participate at bed level activity due to need for assistance with peri hygiene and dressing changes. Today's session addressed bed level therapeutic activity tolerance and educated regarding consistent mobility of BLEs and BUEs for increased endurance to assist with functional mobility inpatient. Pt noted with activity tolerance limitations and impaired bed  mobility requiring Mina to min guard with use of bed rails. Pt continues to remain limited in functional capacity secondary to weakness and deconditioning. Pt would continue to benefit from skilled acute physical therapy services in order to progress toward POC goals, safety/independence with functional mobility and QOL.    Recommendations for follow up therapy are one component of a multi-disciplinary discharge planning process, led by the attending physician.  Recommendations may be updated based on patient status, additional functional criteria and insurance authorization.  Follow Up Recommendations  Skilled nursing-short term rehab (<3 hours/day) Can patient physically be transported by private vehicle: Yes   Assistance Recommended at Discharge Intermittent Supervision/Assistance  Patient can return home with the following A lot of help with walking and/or transfers;Help with stairs or ramp for entrance;A lot of help with bathing/dressing/bathroom   Equipment Recommendations  None recommended by PT    Recommendations for Other Services       Precautions / Restrictions Precautions Precautions: None Restrictions Weight Bearing Restrictions: No     Mobility  Bed Mobility Overal bed mobility: Needs Assistance Bed Mobility: Sit to Supine, Rolling Rolling: Modified independent (Device/Increase time), Min guard         General bed mobility comments: Bilateral rolling multiple times for therapeutic activity and bed level changing as pt hesitant to progress OOB mobility this am due to increased pain and "pneumonia." Pt at min guard level with use of bed rails bilaterally with verbal cuing for proper use of UEs. Pt fairly limited in rolling due to increased exertion with work. Inconitent of urine upon bed mobility, pt requesting changing first before initiating therapy. Anterior  peri hygiene performed at modified independent level with setup assistance and slightly elevated HOB. posterior  peri hygiene at dependent level, unable to rotate trunk and assist posteriorly at bed level. Verbal cues given throughout all session for pursed lip breathing to ease  WOB during bed mobiilty therapuetic activity. Patient Response: Anxious, Cooperative  Transfers                        Ambulation/Gait                   Stairs             Wheelchair Mobility    Modified Rankin (Stroke Patients Only)       Balance Overall balance assessment: Modified Independent   Sitting balance-Leahy Scale: Fair Sitting balance - Comments: fair to good sitting balance with ring sitting position in bed, occasional trunk LOB noted with increasd WOB and breathing noted. Postural control: Posterior lean, Right lateral lean, Left lateral lean                                  Cognition Arousal/Alertness: Awake/alert Behavior During Therapy: WFL for tasks assessed/performed Overall Cognitive Status: Within Functional Limits for tasks assessed                                          Exercises General Exercises - Upper Extremity Shoulder Flexion: AROM, 10 reps, Both General Exercises - Lower Extremity Straight Leg Raises: AROM, Both, 10 reps    General Comments        Pertinent Vitals/Pain Pain Assessment Pain Assessment: Faces Faces Pain Scale: Hurts even more Pain Location: neck    Home Living                          Prior Function            PT Goals (current goals can now be found in the care plan section) Acute Rehab PT Goals PT Goal Formulation: With patient Time For Goal Achievement: 01/30/23 Potential to Achieve Goals: Good Progress towards PT goals: Progressing toward goals    Frequency    Min 3X/week      PT Plan      Co-evaluation              AM-PAC PT "6 Clicks" Mobility   Outcome Measure  Help needed turning from your back to your side while in a flat bed without using bedrails?:  A Lot Help needed moving from lying on your back to sitting on the side of a flat bed without using bedrails?: A Lot Help needed moving to and from a bed to a chair (including a wheelchair)?: A Lot Help needed standing up from a chair using your arms (e.g., wheelchair or bedside chair)?: A Lot Help needed to walk in hospital room?: A Lot Help needed climbing 3-5 steps with a railing? : Total 6 Click Score: 11    End of Session Equipment Utilized During Treatment: Oxygen Activity Tolerance: Patient limited by fatigue;Patient limited by pain Patient left: in bed;with nursing/sitter in room;with call bell/phone within reach Nurse Communication: Mobility status PT Visit Diagnosis: Muscle weakness (generalized) (M62.81);Unsteadiness on feet (R26.81);Difficulty in walking, not elsewhere classified (R26.2)     Time:  5366-4403 PT Time Calculation (min) (ACUTE ONLY): 33 min  Charges:  $Therapeutic Activity: 23-37 mins                     Wonda Olds PT, DPT Physical Therapist with [place] [phone] office   Wonda Olds 01/20/2023, 9:18 AM

## 2023-01-20 NOTE — Discharge Summary (Signed)
Physician Discharge Summary  [name] QIO:[id number] DOB: 1961-01-09 DOA: 01/15/2023  PCP: Agapito Games, MD  Admit date: 01/15/2023  Discharge date: 01/20/2023  Admitted From:Home  Disposition:  Home  Recommendations for Outpatient Follow-up:  Follow up with PCP in 1-2 weeks Follow-up CBC/BMP in 1 week Follow-up with outpatient palliative for goals of care discussion as overall prognosis is quite poor Continue home medications as noted below and remain admitted drain for blood pressure support and hold home antihypertensives Completed course of antibiotics during the course of the stay for 5 days  Home Health: Yes with PT/RN  Equipment/Devices: Has home supplemental oxygen  Discharge Condition:Stable  CODE STATUS: Full  Diet recommendation: Heart Healthy/carb modified  Brief/Interim Summary:  Krystal Burgess is a 61 y.o. female with medical history significant of COPD on supplemental oxygen at 2 LPM, hypertension, type 2 diabetes mellitus, OSA who presents to the emergency department from home via EMS due to 1 week onset of productive cough of greenish sputum.  She was admitted for sepsis secondary to acute on chronic hypoxemic respiratory failure in the setting of community-acquired pneumonia.  Chest x-ray with multilobar pneumonia noted and she was previously admitted last month with pneumonia issues as well.   She was also noted to have AKI on CKD stage IV and tends to have a fluctuating creatinine level, but with improvement noted.  She was noted to be quite hypertensive and initially required pressor use during the course of the stay.  She will remain off of nephrotoxic agents and will need close follow-up with repeat labs in the near future.  She has been tolerating her diet and has a positive fluid balance and if anything, appears slightly volume overloaded and does not require any further fluids.  She is at high risk for readmission as she is generally very  sedentary.  She has been offered SNF placement and was seen by PT with need for inpatient rehabilitation, however she refuses to do this.  She has poor long-term prognosis and should she require readmission, should be evaluated by palliative/hospice at that time.  She has reached maximal medical benefit during the course of the stay and will need close follow-up as recommended above.  Discharge Diagnoses:  Principal Problem:   Sepsis due to pneumonia [facility]) Active Problems:   Mixed hyperlipidemia   Essential hypertension   Type 2 diabetes mellitus (HCC)   COPD (chronic obstructive pulmonary disease) (HCC)   Obesity, Class III, BMI 40-49.9 (morbid obesity) (HCC)   Acute on chronic respiratory failure with hypoxia (HCC)   Acute kidney injury superimposed on chronic kidney disease (HCC)   Lactic acidosis   Elevated troponin   Hypoalbuminemia due to protein-calorie malnutrition (HCC)  Principal discharge diagnosis: Sepsis, present on admission with acute on chronic hypoxemic respiratory failure secondary to community-acquired pneumonia.  AKI on CKD stage IV.  Discharge Instructions  Discharge Instructions     Diet - low sodium heart healthy   Complete by: As directed    Increase activity slowly   Complete by: As directed       Allergies as of 01/20/2023       Reactions   Risperidone And Related Shortness Of Breath   Aripiprazole Other (See Comments)   REACTION: muscle jerks   Fluoxetine Hcl Other (See Comments)   REACTION: Intolerant   Nsaids Other (See Comments)   Upper GI bleed   Paroxetine Other (See Comments)   REACTION: Intolerance   Ziprasidone Hcl Other (See Comments)  shakes   Chantix [varenicline Tartrate] Other (See Comments)   "messing with my mood"   Metformin And Related Nausea Only   Montelukast Other (See Comments)   "it kept me sick with an upper respiratory infection"   Onglyza [saxagliptin] Nausea Only   Augmentin [amoxicillin-pot Clavulanate] Other (See  Comments)   Gets yeast infection every time   Fluoxetine Hcl    REACTION: Intolerant        Medication List     STOP taking these medications    amLODipine 5 MG tablet Commonly known as: NORVASC   levofloxacin 500 MG tablet Commonly known as: LEVAQUIN   losartan 100 MG tablet Commonly known as: COZAAR   predniSONE 20 MG tablet Commonly known as: DELTASONE   predniSONE 5 MG tablet Commonly known as: DELTASONE       TAKE these medications    albuterol 108 (90 Base) MCG/ACT inhaler Commonly known as: VENTOLIN HFA INHALE 2 PUFFS INTO THE LUNGS EVERY 4 HOURS AS NEEDED FOR WHEEZING OR SHORTNESS OF BREATH.   ALPRAZolam 1 MG tablet Commonly known as: XANAX Take 1 tablet (1 mg total) by mouth 2 (two) times daily as needed. for anxiety   AMBULATORY NON FORMULARY MEDICATION Medication Name: Portable battery-powered nebulizer.Dx: COPD. [facility] [phone] ([phone])   AMBULATORY NON FORMULARY MEDICATION Medication Name: Upright standing Rollator with seat. Dx: code M51.36   B-D 3CC LUER-LOK SYR 25GX1" 25G X 1" 3 ML Misc Generic drug: SYRINGE-NEEDLE (DISP) 3 ML Inject into the muscle once a week.   fluticasone 50 MCG/ACT nasal spray Commonly known as: FLONASE SPRAY 2 SPRAYS INTO EACH NOSTRIL EVERY DAY   folic acid 1 MG tablet Commonly known as: FOLVITE TAKE 1 TABLET BY MOUTH EVERY DAY   Fusion Plus Caps Take 1 capsule by mouth daily.   gabapentin 600 MG tablet Commonly known as: NEURONTIN Take 1 tablet (600 mg total) by mouth 2 (two) times daily. Patient takes 1200 mg daily What changed:  when to take this additional instructions   ipratropium 0.02 % nebulizer solution Commonly known as: ATROVENT Take 2.5 mLs (0.5 mg total) by nebulization 3 (three) times daily as needed for wheezing or shortness of breath.   ipratropium 0.06 % nasal spray Commonly known as: ATROVENT Place 2 sprays into both nostrils 4 (four) times daily.    ipratropium-albuterol 0.5-2.5 (3) MG/3ML Soln Commonly known as: DUONEB Take 3 mLs by nebulization every 2 (two) hours as needed (shortness of breath).   levocetirizine 5 MG tablet Commonly known as: XYZAL TAKE 1 TABLET BY MOUTH EVERY DAY IN THE EVENING   levothyroxine 88 MCG tablet Commonly known as: SYNTHROID Take 1 tablet (88 mcg total) by mouth daily.   midodrine 10 MG tablet Commonly known as: PROAMATINE Take 1 tablet (10 mg total) by mouth 3 (three) times daily with meals.   ondansetron 8 MG tablet Commonly known as: ZOFRAN Take 8 mg by mouth 3 (three) times daily.   Oxycodone HCl 10 MG Tabs Take 1 tablet (10 mg total) by mouth every 8 (eight) hours. Ok to take extra tab daily for the next 10 days after injury   pantoprazole 40 MG tablet Commonly known as: PROTONIX TAKE 1 TABLET BY MOUTH EVERY DAY   promethazine 25 MG tablet Commonly known as: PHENERGAN TAKE 1 TABLET BY MOUTH 2 TIMES A DAY AS NEEDED FOR NAUSEA OR VOMITING   rosuvastatin 20 MG tablet Commonly known as: CRESTOR TAKE 1 TABLET BY MOUTH EVERY DAY  sertraline 100 MG tablet Commonly known as: ZOLOFT TAKE 2 TABLETS BY MOUTH EVERY DAY   theophylline 300 MG 12 hr tablet Commonly known as: THEODUR TAKE 1 TABLET BY MOUTH TWICE A DAY   tiZANidine 4 MG tablet Commonly known as: ZANAFLEX TAKE 1 TABLET BY MOUTH AT BEDTIME AS NEEDED FOR MUSCLE SPASMS. What changed: See the new instructions.   traZODone 100 MG tablet Commonly known as: DESYREL TAKE 1 TABLET BY MOUTH EVERYDAY AT BEDTIME What changed: See the new instructions.   Trelegy Ellipta 100-62.5-25 MCG/ACT Aepb Generic drug: Fluticasone-Umeclidin-Vilant TAKE 1 PUFF BY MOUTH EVERY DAY What changed: See the new instructions.        Contact information for follow-up providers     Hali Marry, MD. Schedule an appointment as soon as possible for a visit .   Specialty: Family Medicine Why: For recheck of your symptoms Contact  information: [address] [place] [address] [phone]              Contact information for after-discharge care     Destination     [facility] .   Service: Skilled Nursing Contact information: [address] [address] [phone]                    Allergies  Allergen Reactions   Risperidone And Related Shortness Of Breath   Aripiprazole Other (See Comments)    REACTION: muscle jerks    Fluoxetine Hcl Other (See Comments)    REACTION: Intolerant   Nsaids Other (See Comments)    Upper GI bleed    Paroxetine Other (See Comments)    REACTION: Intolerance    Ziprasidone Hcl Other (See Comments)    shakes    Chantix [Varenicline Tartrate] Other (See Comments)    "messing with my mood"   Metformin And Related Nausea Only   Montelukast Other (See Comments)    "it kept me sick with an upper respiratory infection"   Onglyza [Saxagliptin] Nausea Only   Augmentin [Amoxicillin-Pot Clavulanate] Other (See Comments)    Gets yeast infection every time    Fluoxetine Hcl     REACTION: Intolerant    Consultations: None   Procedures/Studies: DG Chest Port 1 View  Result Date: 01/15/2023 CLINICAL DATA:  Questionable sepsis. EXAM: PORTABLE CHEST 1 VIEW COMPARISON:  Chest radiograph and CT dated 03/02/2021. FINDINGS: Scatter pulmonary nodules as seen on the prior CT most consistent with multi lobar pneumonia, possibly atypical in etiology. Clinical correlation and follow-up to resolution recommended. There is slight blunting of the left costophrenic angle which may represent scarring or trace pleural effusion. No pneumothorax. The cardiac silhouette is within normal limits. No acute osseous pathology. IMPRESSION: Multi lobar pneumonia. Clinical correlation and follow-up to resolution recommended. Electronically Signed   By: Anner Crete M.D.   On: 01/15/2023 20:30     Discharge  Exam: Vitals:   01/20/23 0517 01/20/23 0722  BP:    Pulse:    Resp:    Temp:    SpO2: 94% 94%   Vitals:   01/19/23 2300 01/20/23 0331 01/20/23 0517 01/20/23 0722  BP: (!) 93/50 (!) 97/56    Pulse: 83 75    Resp: 20 20    Temp: 98.7 F (37.1 C) 98.6 F (37 C)    TempSrc: Oral     SpO2: 97% 98% 94% 94%  Weight:      Height:  General: Pt is alert, awake, not in acute distress, obese Cardiovascular: RRR, S1/S2 +, no rubs, no gallops Respiratory: CTA bilaterally, no wheezing, no rhonchi, 4 L nasal cannula Abdominal: Soft, NT, ND, bowel sounds + Extremities: no edema, no cyanosis    The results of significant diagnostics from this hospitalization (including imaging, microbiology, ancillary and laboratory) are listed below for reference.     Microbiology: Recent Results (from the past 240 hour(s))  Resp panel by RT-PCR (RSV, Flu A&B, Covid) Anterior Nasal Swab     Status: None   Collection Time: 01/15/23  8:09 PM   Specimen: Anterior Nasal Swab  Result Value Ref Range Status   SARS Coronavirus 2 by RT PCR NEGATIVE NEGATIVE Final    Comment: (NOTE) SARS-CoV-2 target nucleic acids are NOT DETECTED.  The SARS-CoV-2 RNA is generally detectable in upper respiratory specimens during the acute phase of infection. The lowest concentration of SARS-CoV-2 viral copies this assay can detect is 138 copies/mL. A negative result does not preclude SARS-Cov-2 infection and should not be used as the sole basis for treatment or other patient management decisions. A negative result may occur with  improper specimen collection/handling, submission of specimen other than nasopharyngeal swab, presence of viral mutation(s) within the areas targeted by this assay, and inadequate number of viral copies(<138 copies/mL). A negative result must be combined with clinical observations, patient history, and epidemiological information. The expected result is Negative.  Fact Sheet for  Patients:  BloggerCourse.com  Fact Sheet for Healthcare Providers:  SeriousBroker.it  This test is no t yet approved or cleared by the [place] FDA and  has been authorized for detection and/or diagnosis of SARS-CoV-2 by FDA under an Emergency Use Authorization (EUA). This EUA will remain  in effect (meaning this test can be used) for the duration of the COVID-19 declaration under Section 564(b)(1) of the Act, 21 U.S.C.section 360bbb-3(b)(1), unless the authorization is terminated  or revoked sooner.       Influenza A by PCR NEGATIVE NEGATIVE Final   Influenza B by PCR NEGATIVE NEGATIVE Final    Comment: (NOTE) The Xpert Xpress SARS-CoV-2/FLU/RSV plus assay is intended as an aid in the diagnosis of influenza from Nasopharyngeal swab specimens and should not be used as a sole basis for treatment. Nasal washings and aspirates are unacceptable for Xpert Xpress SARS-CoV-2/FLU/RSV testing.  Fact Sheet for Patients: BloggerCourse.com  Fact Sheet for Healthcare Providers: SeriousBroker.it  This test is not yet approved or cleared by the [place] FDA and has been authorized for detection and/or diagnosis of SARS-CoV-2 by FDA under an Emergency Use Authorization (EUA). This EUA will remain in effect (meaning this test can be used) for the duration of the COVID-19 declaration under Section 564(b)(1) of the Act, 21 U.S.C. section 360bbb-3(b)(1), unless the authorization is terminated or revoked.     Resp Syncytial Virus by PCR NEGATIVE NEGATIVE Final    Comment: (NOTE) Fact Sheet for Patients: BloggerCourse.com  Fact Sheet for Healthcare Providers: SeriousBroker.it  This test is not yet approved or cleared by the [place] FDA and has been authorized for detection and/or diagnosis of SARS-CoV-2 by FDA under an Emergency  Use Authorization (EUA). This EUA will remain in effect (meaning this test can be used) for the duration of the COVID-19 declaration under Section 564(b)(1) of the Act, 21 U.S.C. section 360bbb-3(b)(1), unless the authorization is terminated or revoked.  Performed at [facility], [address] [place], [place] [address]   Blood Culture (routine x 2)  Status: None (Preliminary result)   Collection Time: 01/15/23  8:16 PM   Specimen: BLOOD  Result Value Ref Range Status   Specimen Description BLOOD BLOOD RIGHT HAND  Final   Special Requests   Final    BOTTLES DRAWN AEROBIC ONLY Blood Culture results may not be optimal due to an inadequate volume of blood received in culture bottles   Culture   Final    NO GROWTH 4 DAYS Performed at [facility], [address] [place], [place] [address]    Report Status PENDING  Incomplete  Blood Culture (routine x 2)     Status: None (Preliminary result)   Collection Time: 01/15/23  8:30 PM   Specimen: BLOOD  Result Value Ref Range Status   Specimen Description BLOOD BLOOD LEFT HAND  Final   Special Requests   Final    BOTTLES DRAWN AEROBIC AND ANAEROBIC Blood Culture adequate volume   Culture   Final    NO GROWTH 4 DAYS Performed at [facility], [address], [place], [place] [address]    Report Status PENDING  Incomplete  MRSA Next Gen by PCR, Nasal     Status: Abnormal   Collection Time: 01/16/23  4:05 AM   Specimen: Nasal Mucosa; Nasal Swab  Result Value Ref Range Status   MRSA by PCR Next Gen DETECTED (A) NOT DETECTED Final    Comment: RESULT CALLED TO, READ BACK BY AND VERIFIED WITH: HYLTON L @ 0852 ON [id number]  BY HENDERSON L (NOTE) The GeneXpert MRSA Assay (FDA approved for NASAL specimens only), is one component of a comprehensive MRSA colonization surveillance program. It is not intended to diagnose MRSA infection nor to guide or monitor treatment for MRSA infections. Test performance is not FDA approved in patients less than  71 years old. Performed at [facility], [address] [place], [place] [address]      Labs: BNP (last 3 results) Recent Labs    01/15/23 2030  BNP 70.0   Basic Metabolic Panel: Recent Labs  Lab 01/16/23 0430 01/17/23 0256 01/18/23 0417 01/19/23 0528 01/20/23 0416  NA 136 134* 134* 138 139  K 4.6 3.7 3.4* 3.8 4.1  CL 99 99 96* 100 102  CO2 26 25 26 27 29   GLUCOSE 125* 83 106* 97 103*  BUN 24* 20 18 15 15   CREATININE 3.55* 2.34* 2.08* 2.13* 2.24*  CALCIUM 8.7* 8.1* 8.2* 8.3* 8.4*  MG 1.7 1.5* 1.8 1.9 1.9  PHOS 3.8  --   --   --   --    Liver Function Tests: Recent Labs  Lab 01/15/23 2030 01/16/23 0430  AST 67* 69*  ALT 26 26  ALKPHOS 94 86  BILITOT 0.6 0.4  PROT 7.2 6.9  ALBUMIN 3.0* 2.7*   No results for input(s): "LIPASE", "AMYLASE" in the last 168 hours. Recent Labs  Lab 01/18/23 1135  AMMONIA 26   CBC: Recent Labs  Lab 01/15/23 2030 01/16/23 0430 01/17/23 0256 01/18/23 0417 01/19/23 0528 01/20/23 0416  WBC 19.3* 21.9* 15.3* 19.4* 19.3* 17.5*  NEUTROABS 16.1*  --   --   --   --   --   HGB 8.6* 8.8* 8.0* 8.5* 8.2* 8.3*  HCT 28.1* 28.7* 25.8* 27.6* 27.8* 27.8*  MCV 96.9 96.0 94.5 94.5 96.2 98.9  PLT 292 286 313 400 445* 463*   Cardiac Enzymes: No results for input(s): "CKTOTAL", "CKMB", "CKMBINDEX", "TROPONINI" in the last 168 hours. BNP: Invalid input(s): "POCBNP" CBG: Recent Labs  Lab  01/16/23 0417 01/18/23 0820 01/18/23 1149 01/18/23 1618 01/18/23 2138  GLUCAP 121* 110* 108* 115* 95   D-Dimer No results for input(s): "DDIMER" in the last 72 hours. Hgb A1c No results for input(s): "HGBA1C" in the last 72 hours. Lipid Profile No results for input(s): "CHOL", "HDL", "LDLCALC", "TRIG", "CHOLHDL", "LDLDIRECT" in the last 72 hours. Thyroid function studies Recent Labs    01/18/23 1135  TSH 7.334*   Anemia work up No results for input(s): "VITAMINB12", "FOLATE", "FERRITIN", "TIBC", "IRON", "RETICCTPCT" in the last 72  hours. Urinalysis    Component Value Date/Time   COLORURINE YELLOW 03/02/2021 1449   APPEARANCEUR CLEAR 03/02/2021 1449   LABSPEC 1.009 03/02/2021 1449   PHURINE 6.0 03/02/2021 1449   GLUCOSEU NEGATIVE 03/02/2021 1449   HGBUR NEGATIVE 03/02/2021 1449   HGBUR negative 05/03/2009 1012   BILIRUBINUR NEGATIVE 03/02/2021 1449   BILIRUBINUR neg 01/15/2015 1433   KETONESUR NEGATIVE 03/02/2021 1449   PROTEINUR NEGATIVE 03/02/2021 1449   UROBILINOGEN 0.2 01/15/2015 1433   UROBILINOGEN 0.2 05/03/2009 1012   NITRITE NEGATIVE 03/02/2021 1449   LEUKOCYTESUR NEGATIVE 03/02/2021 1449   Sepsis Labs Recent Labs  Lab 01/17/23 0256 01/18/23 0417 01/19/23 0528 01/20/23 0416  WBC 15.3* 19.4* 19.3* 17.5*   Microbiology Recent Results (from the past 240 hour(s))  Resp panel by RT-PCR (RSV, Flu A&B, Covid) Anterior Nasal Swab     Status: None   Collection Time: 01/15/23  8:09 PM   Specimen: Anterior Nasal Swab  Result Value Ref Range Status   SARS Coronavirus 2 by RT PCR NEGATIVE NEGATIVE Final    Comment: (NOTE) SARS-CoV-2 target nucleic acids are NOT DETECTED.  The SARS-CoV-2 RNA is generally detectable in upper respiratory specimens during the acute phase of infection. The lowest concentration of SARS-CoV-2 viral copies this assay can detect is 138 copies/mL. A negative result does not preclude SARS-Cov-2 infection and should not be used as the sole basis for treatment or other patient management decisions. A negative result may occur with  improper specimen collection/handling, submission of specimen other than nasopharyngeal swab, presence of viral mutation(s) within the areas targeted by this assay, and inadequate number of viral copies(<138 copies/mL). A negative result must be combined with clinical observations, patient history, and epidemiological information. The expected result is Negative.  Fact Sheet for Patients:  EntrepreneurPulse.com.au  Fact Sheet for  Healthcare Providers:  IncredibleEmployment.be  This test is no t yet approved or cleared by the [place] FDA and  has been authorized for detection and/or diagnosis of SARS-CoV-2 by FDA under an Emergency Use Authorization (EUA). This EUA will remain  in effect (meaning this test can be used) for the duration of the COVID-19 declaration under Section 564(b)(1) of the Act, 21 U.S.C.section 360bbb-3(b)(1), unless the authorization is terminated  or revoked sooner.       Influenza A by PCR NEGATIVE NEGATIVE Final   Influenza B by PCR NEGATIVE NEGATIVE Final    Comment: (NOTE) The Xpert Xpress SARS-CoV-2/FLU/RSV plus assay is intended as an aid in the diagnosis of influenza from Nasopharyngeal swab specimens and should not be used as a sole basis for treatment. Nasal washings and aspirates are unacceptable for Xpert Xpress SARS-CoV-2/FLU/RSV testing.  Fact Sheet for Patients: EntrepreneurPulse.com.au  Fact Sheet for Healthcare Providers: IncredibleEmployment.be  This test is not yet approved or cleared by the [place] FDA and has been authorized for detection and/or diagnosis of SARS-CoV-2 by FDA under an Emergency Use Authorization (EUA). This EUA will remain in effect (  meaning this test can be used) for the duration of the COVID-19 declaration under Section 564(b)(1) of the Act, 21 U.S.C. section 360bbb-3(b)(1), unless the authorization is terminated or revoked.     Resp Syncytial Virus by PCR NEGATIVE NEGATIVE Final    Comment: (NOTE) Fact Sheet for Patients: EntrepreneurPulse.com.au  Fact Sheet for Healthcare Providers: IncredibleEmployment.be  This test is not yet approved or cleared by the [place] FDA and has been authorized for detection and/or diagnosis of SARS-CoV-2 by FDA under an Emergency Use Authorization (EUA). This EUA will remain in effect (meaning this  test can be used) for the duration of the COVID-19 declaration under Section 564(b)(1) of the Act, 21 U.S.C. section 360bbb-3(b)(1), unless the authorization is terminated or revoked.  Performed at [facility], [address], [place], [place] [address]   Blood Culture (routine x 2)     Status: None (Preliminary result)   Collection Time: 01/15/23  8:16 PM   Specimen: BLOOD  Result Value Ref Range Status   Specimen Description BLOOD BLOOD RIGHT HAND  Final   Special Requests   Final    BOTTLES DRAWN AEROBIC ONLY Blood Culture results may not be optimal due to an inadequate volume of blood received in culture bottles   Culture   Final    NO GROWTH 4 DAYS Performed at [facility], [address] [place], [place] [address]    Report Status PENDING  Incomplete  Blood Culture (routine x 2)     Status: None (Preliminary result)   Collection Time: 01/15/23  8:30 PM   Specimen: BLOOD  Result Value Ref Range Status   Specimen Description BLOOD BLOOD LEFT HAND  Final   Special Requests   Final    BOTTLES DRAWN AEROBIC AND ANAEROBIC Blood Culture adequate volume   Culture   Final    NO GROWTH 4 DAYS Performed at [facility], [address] [place], [place] [address]    Report Status PENDING  Incomplete  MRSA Next Gen by PCR, Nasal     Status: Abnormal   Collection Time: 01/16/23  4:05 AM   Specimen: Nasal Mucosa; Nasal Swab  Result Value Ref Range Status   MRSA by PCR Next Gen DETECTED (A) NOT DETECTED Final    Comment: RESULT CALLED TO, READ BACK BY AND VERIFIED WITH: HYLTON L @ 2025 ON [id number]  BY HENDERSON L (NOTE) The GeneXpert MRSA Assay (FDA approved for NASAL specimens only), is one component of a comprehensive MRSA colonization surveillance program. It is not intended to diagnose MRSA infection nor to guide or monitor treatment for MRSA infections. Test performance is not FDA approved in patients less than 77 years old. Performed at [facility], [address],  [place], [place] [address]      Time coordinating discharge: 35 minutes  SIGNED:   Rodena Goldmann, DO [facility] 01/20/2023, 9:45 AM  If 7PM-7AM, please contact night-coverage [email or web address]

## 2023-01-21 ENCOUNTER — Telehealth: Payer: Self-pay | Admitting: *Deleted

## 2023-01-21 ENCOUNTER — Encounter: Payer: Self-pay | Admitting: *Deleted

## 2023-01-21 NOTE — Patient Outreach (Signed)
  Care Coordination [facility] Note Transition Care Management Unsuccessful Follow-up Telephone Call  Date of discharge and from where:  Tuesday 01/20/23; [facility]; sepsis pneumonia; acute on chronic respiratory failure with poor prognosis  Attempts:  1st Attempt  Reason for unsuccessful TCM follow-up call:  Unable to reach patient   With initial call attempt: Received automated outgoing voice message stating that patient's voice mail box is full and unable to accept messages; unable to leave voice message requesting call back; will attempt alternate number listed for patient  With subsequent outreach to alternate number:  spoke with daughter (not listed on [place]) who requested call back tomorrow between 2-3 pm when she will be at home with patient; will attempt contact / outreach again tomorrow as requested  Oneta Rack, RN, BSN, CCRN Alumnus RN CM Care Coordination/ Transition of [place] Management [phone]: direct office

## 2023-01-22 ENCOUNTER — Telehealth: Payer: Self-pay | Admitting: *Deleted

## 2023-01-22 ENCOUNTER — Encounter: Payer: Self-pay | Admitting: *Deleted

## 2023-01-22 NOTE — Patient Outreach (Signed)
Care Coordination [facility] Note Transition Care Management Follow-up Telephone Call Date of discharge and from where: Tuesday, 01/20/23 [facility] How have you been since you were released from the hospital? Per patient and her daughter Lexine Baton, whom patient provides verbal consent to speak with "at any time;" "Other people might not think I am doing so good, but I think I am doing much better since I am finally out of that hospital.  I have fallen twice since I got home because all they did at the hospital was keep in the bed, so I came home and my legs were weak as water.... I am using my rollator walker, and I didn't get hurt, but I am just weak as can be; I have to call EMS when I fall because I can't get myself up and nobody else can get me up either.  We have not heard from home health team yet, but we will call, we are familiar with [facility], she has used them before.  And I appreciate you arranging her follow up appointment today.  We will definitely update the release form so it includes my daughter's name-- you can talk to [place] anytime" Any questions or concerns? Yes -- have not heard from home health team: provided number for [facility] home health agency; daughter Lexine Baton will contact them to initiate services- provided my direct phone number should she need assistance with this after she attempts call herself; she reports she is familiar with [facility] agency, they have used them in the past -- no hospital follow up visit scheduled with PCP: verified recommended hospital follow up with PCP within one week: worked in real-time with scheduling care guide to secure office visit on Monday 01/26/23 at 2:40 pm: patient/ her daughter report they will attend as scheduled -- multiple falls despite use of assistive devices: fall screening/ assessment completed; fall prevention education provided -- scheduled follow up call with RN CM Care Coordinator after upcoming scheduled PCP appointment; provided  my direct phone number should questions/ needs/ concerns arise prior to scheduled telephone visit with RN CM on 02/03/23  Items Reviewed: Did the pt receive and understand the discharge instructions provided? Yes - thoroughly reviewed all aspects of AVS discharge instructions with patient/ daughter; they verbalize good understanding of same Medications obtained and verified? Yes  full medication review completed as below; daughter Lexine Baton assists in managing medications Other? No  Any new allergies since your discharge? No  Dietary orders reviewed? Yes Do you have support at home? Yes  patient resides with her daughter Lexine Baton; requires assistance with daily care activities  Home Care and Equipment/Supplies: Were home health services ordered? yes If so, what is the name of the agency? [facility]-- for RN and PT  Has the agency set up a time to come to the patient's home? no Were any new equipment or medical supplies ordered?  No What is the name of the medical supply agency? N/A Were you able to get the supplies/equipment? not applicable Do you have any questions related to the use of the equipment or supplies? No  Functional Questionnaire: (I = Independent and D = Dependent) ADLs: D  requires assistance with MOST daily care activities  Bathing/Dressing- D  Meal Prep- D  Eating- I  Maintaining continence- D  Transferring/Ambulation- D  Managing Meds- D  Follow up appointments reviewed:  PCP Hospital f/u appt confirmed? Yes  Scheduled to see PCP, Dr. Suzi Roots on Monday 01/26/23 @ 2:40 pm Specialist Hospital f/u appt confirmed? No  Scheduled to see - on - @ - Are transportation arrangements needed? No  If their condition worsens, is the pt aware to call PCP or go to the Emergency Dept.? Yes Was the patient provided with contact information for the PCP's office or ED? No- declined; reports they already have contact information for all care providers Was to pt encouraged to call back  with questions or concerns? Yes  SDOH assessments and interventions completed:   Yes SDOH Interventions Today    Flowsheet Row Most Recent Value  SDOH Interventions   Food Insecurity Interventions Intervention Not Indicated  Transportation Interventions Intervention Not Indicated  [reports daughter provides transportation]      Interventions Today    Flowsheet Row Most Recent Value  Chronic Disease Discussed/Reviewed   Chronic disease discussed/reviewed during today's visit Chronic Obstructive Pulmonary Disease (COPD)  General Interventions   General Interventions Discussed/Reviewed General Interventions Discussed, Referral to Nurse, Doctor Visits  Doctor Visits Discussed/Reviewed Doctor Visits Discussed, PCP  [facility] follow up visit in real-time working with scheduling care guide team for 01/26/23]  PCP/Specialist Visits Compliance with follow-up visit  Education Interventions   Education Provided Provided Verbal Education  [fall prevention,  fall assessment completed,  purpose and importance of having updated DPR form in [place] Interventions   Pharmacy Dicussed/Reviewed Pharmacy Topics Discussed  [full medication review completed,  confirmed patient obtained/ is taking newly Rx's meds and has made all med changes as instructed,  no concerns or discrepancies identified,  dghtr assist with medications,  denies concerns/ questions]       Care Coordination Interventions:  PCP follow up appointment requested [facility] for Care Coordination Services:  RN Care Coordinator Full medication review completed; education provided around fall prevention and purpose of DPR form completion; care coordination outreach with scheduling care guide in real time to facilitate scheduling of hospital follow up visit; care coordination outreach with RN CM as FYI; fall assessment completion     Encounter Outcome:  Pt. Visit Completed    Oneta Rack, RN, BSN, CCRN Alumnus RN CM  Care Coordination/ Transition of [place] Management [phone]: direct office

## 2023-01-22 NOTE — Progress Notes (Signed)
  Care Coordination  Note  01/22/2023 Name: [name] MRN: [id number] DOB: [birth year]  [name] is a 62 y.o. year old primary care patient of Madilyn Fireman Rene Kocher, MD.   Follow up plan: Hospital Follow Up appointment scheduled with (Dr Madilyn Fireman) on (01/26/2023) at (240pm).  Julian Hy, [place] Direct Dial: [phone]

## 2023-01-26 ENCOUNTER — Inpatient Hospital Stay: Payer: 59 | Admitting: Family Medicine

## 2023-01-29 ENCOUNTER — Inpatient Hospital Stay (HOSPITAL_COMMUNITY)
Admission: EM | Admit: 2023-01-29 | Discharge: 2023-02-02 | DRG: 392 | Disposition: A | Discharge status: Skilled Nursing Facility | Payer: 59 | Attending: Internal Medicine | Admitting: Internal Medicine

## 2023-01-29 ENCOUNTER — Encounter (HOSPITAL_COMMUNITY): Payer: Self-pay | Admitting: Emergency Medicine

## 2023-01-29 ENCOUNTER — Emergency Department (HOSPITAL_COMMUNITY): Payer: 59

## 2023-01-29 ENCOUNTER — Observation Stay (HOSPITAL_COMMUNITY): Payer: 59

## 2023-01-29 ENCOUNTER — Other Ambulatory Visit: Payer: Self-pay

## 2023-01-29 DIAGNOSIS — E7220 Disorder of urea cycle metabolism, unspecified: Secondary | ICD-10-CM | POA: Diagnosis not present

## 2023-01-29 DIAGNOSIS — M4312 Spondylolisthesis, cervical region: Secondary | ICD-10-CM | POA: Diagnosis not present

## 2023-01-29 DIAGNOSIS — Z8249 Family history of ischemic heart disease and other diseases of the circulatory system: Secondary | ICD-10-CM

## 2023-01-29 DIAGNOSIS — R911 Solitary pulmonary nodule: Secondary | ICD-10-CM | POA: Diagnosis not present

## 2023-01-29 DIAGNOSIS — E861 Hypovolemia: Secondary | ICD-10-CM | POA: Diagnosis not present

## 2023-01-29 DIAGNOSIS — Z833 Family history of diabetes mellitus: Secondary | ICD-10-CM

## 2023-01-29 DIAGNOSIS — I129 Hypertensive chronic kidney disease with stage 1 through stage 4 chronic kidney disease, or unspecified chronic kidney disease: Secondary | ICD-10-CM | POA: Diagnosis present

## 2023-01-29 DIAGNOSIS — S0990XA Unspecified injury of head, initial encounter: Secondary | ICD-10-CM | POA: Diagnosis not present

## 2023-01-29 DIAGNOSIS — R627 Adult failure to thrive: Secondary | ICD-10-CM | POA: Diagnosis present

## 2023-01-29 DIAGNOSIS — I959 Hypotension, unspecified: Secondary | ICD-10-CM | POA: Diagnosis present

## 2023-01-29 DIAGNOSIS — Z87891 Personal history of nicotine dependence: Secondary | ICD-10-CM | POA: Diagnosis not present

## 2023-01-29 DIAGNOSIS — Z83438 Family history of other disorder of lipoprotein metabolism and other lipidemia: Secondary | ICD-10-CM

## 2023-01-29 DIAGNOSIS — R112 Nausea with vomiting, unspecified: Principal | ICD-10-CM | POA: Diagnosis present

## 2023-01-29 DIAGNOSIS — F332 Major depressive disorder, recurrent severe without psychotic features: Secondary | ICD-10-CM | POA: Diagnosis present

## 2023-01-29 DIAGNOSIS — R531 Weakness: Secondary | ICD-10-CM | POA: Diagnosis not present

## 2023-01-29 DIAGNOSIS — J9611 Chronic respiratory failure with hypoxia: Secondary | ICD-10-CM | POA: Diagnosis present

## 2023-01-29 DIAGNOSIS — Z1152 Encounter for screening for COVID-19: Secondary | ICD-10-CM

## 2023-01-29 DIAGNOSIS — Z79899 Other long term (current) drug therapy: Secondary | ICD-10-CM

## 2023-01-29 DIAGNOSIS — R001 Bradycardia, unspecified: Secondary | ICD-10-CM | POA: Diagnosis not present

## 2023-01-29 DIAGNOSIS — E039 Hypothyroidism, unspecified: Secondary | ICD-10-CM | POA: Diagnosis not present

## 2023-01-29 DIAGNOSIS — J449 Chronic obstructive pulmonary disease, unspecified: Secondary | ICD-10-CM | POA: Diagnosis present

## 2023-01-29 DIAGNOSIS — M6282 Rhabdomyolysis: Secondary | ICD-10-CM | POA: Diagnosis not present

## 2023-01-29 DIAGNOSIS — E876 Hypokalemia: Secondary | ICD-10-CM | POA: Diagnosis not present

## 2023-01-29 DIAGNOSIS — Z7951 Long term (current) use of inhaled steroids: Secondary | ICD-10-CM

## 2023-01-29 DIAGNOSIS — E782 Mixed hyperlipidemia: Secondary | ICD-10-CM | POA: Diagnosis present

## 2023-01-29 DIAGNOSIS — Z880 Allergy status to penicillin: Secondary | ICD-10-CM

## 2023-01-29 DIAGNOSIS — F419 Anxiety disorder, unspecified: Secondary | ICD-10-CM | POA: Diagnosis present

## 2023-01-29 DIAGNOSIS — G4700 Insomnia, unspecified: Secondary | ICD-10-CM | POA: Diagnosis not present

## 2023-01-29 DIAGNOSIS — R0689 Other abnormalities of breathing: Secondary | ICD-10-CM | POA: Diagnosis not present

## 2023-01-29 DIAGNOSIS — N1832 Chronic kidney disease, stage 3b: Secondary | ICD-10-CM | POA: Diagnosis present

## 2023-01-29 DIAGNOSIS — F418 Other specified anxiety disorders: Secondary | ICD-10-CM | POA: Diagnosis present

## 2023-01-29 DIAGNOSIS — R062 Wheezing: Secondary | ICD-10-CM | POA: Diagnosis not present

## 2023-01-29 DIAGNOSIS — Z886 Allergy status to analgesic agent status: Secondary | ICD-10-CM

## 2023-01-29 DIAGNOSIS — R0602 Shortness of breath: Secondary | ICD-10-CM | POA: Diagnosis not present

## 2023-01-29 DIAGNOSIS — J984 Other disorders of lung: Secondary | ICD-10-CM | POA: Diagnosis not present

## 2023-01-29 DIAGNOSIS — F32A Depression, unspecified: Secondary | ICD-10-CM | POA: Diagnosis present

## 2023-01-29 DIAGNOSIS — Z888 Allergy status to other drugs, medicaments and biological substances status: Secondary | ICD-10-CM

## 2023-01-29 DIAGNOSIS — G8929 Other chronic pain: Secondary | ICD-10-CM | POA: Diagnosis present

## 2023-01-29 DIAGNOSIS — Z79890 Hormone replacement therapy (postmenopausal): Secondary | ICD-10-CM

## 2023-01-29 DIAGNOSIS — G319 Degenerative disease of nervous system, unspecified: Secondary | ICD-10-CM | POA: Diagnosis not present

## 2023-01-29 DIAGNOSIS — G4733 Obstructive sleep apnea (adult) (pediatric): Secondary | ICD-10-CM | POA: Diagnosis present

## 2023-01-29 DIAGNOSIS — R9082 White matter disease, unspecified: Secondary | ICD-10-CM | POA: Diagnosis not present

## 2023-01-29 DIAGNOSIS — S199XXA Unspecified injury of neck, initial encounter: Secondary | ICD-10-CM | POA: Diagnosis not present

## 2023-01-29 DIAGNOSIS — I739 Peripheral vascular disease, unspecified: Secondary | ICD-10-CM | POA: Diagnosis not present

## 2023-01-29 DIAGNOSIS — Z6841 Body Mass Index (BMI) 40.0 and over, adult: Secondary | ICD-10-CM

## 2023-01-29 LAB — URINALYSIS, ROUTINE W REFLEX MICROSCOPIC
Bilirubin Urine: NEGATIVE
Glucose, UA: NEGATIVE mg/dL
Ketones, ur: NEGATIVE mg/dL
Leukocytes,Ua: NEGATIVE
Nitrite: NEGATIVE
Protein, ur: NEGATIVE mg/dL
Specific Gravity, Urine: 1.008 (ref 1.005–1.030)
pH: 6 (ref 5.0–8.0)

## 2023-01-29 LAB — CBC WITH DIFFERENTIAL/PLATELET
Abs Immature Granulocytes: 0.02 10*3/uL (ref 0.00–0.07)
Basophils Absolute: 0.1 10*3/uL (ref 0.0–0.1)
Basophils Relative: 1 %
Eosinophils Absolute: 0.3 10*3/uL (ref 0.0–0.5)
Eosinophils Relative: 3 %
HCT: 30.9 % — ABNORMAL LOW (ref 36.0–46.0)
Hemoglobin: 9.1 g/dL — ABNORMAL LOW (ref 12.0–15.0)
Immature Granulocytes: 0 %
Lymphocytes Relative: 28 %
Lymphs Abs: 2.6 10*3/uL (ref 0.7–4.0)
MCH: 28.5 pg (ref 26.0–34.0)
MCHC: 29.4 g/dL — ABNORMAL LOW (ref 30.0–36.0)
MCV: 96.9 fL (ref 80.0–100.0)
Monocytes Absolute: 0.7 10*3/uL (ref 0.1–1.0)
Monocytes Relative: 8 %
Neutro Abs: 5.5 10*3/uL (ref 1.7–7.7)
Neutrophils Relative %: 60 %
Platelets: 572 10*3/uL — ABNORMAL HIGH (ref 150–400)
RBC: 3.19 MIL/uL — ABNORMAL LOW (ref 3.87–5.11)
RDW: 15 % (ref 11.5–15.5)
WBC: 9.3 10*3/uL (ref 4.0–10.5)
nRBC: 0 % (ref 0.0–0.2)

## 2023-01-29 LAB — VITAMIN B12: Vitamin B-12: 655 pg/mL (ref 180–914)

## 2023-01-29 LAB — COMPREHENSIVE METABOLIC PANEL
ALT: 25 U/L (ref 0–44)
AST: 78 U/L — ABNORMAL HIGH (ref 15–41)
Albumin: 2.6 g/dL — ABNORMAL LOW (ref 3.5–5.0)
Alkaline Phosphatase: 82 U/L (ref 38–126)
Anion gap: 11 (ref 5–15)
BUN: 10 mg/dL (ref 6–20)
CO2: 30 mmol/L (ref 22–32)
Calcium: 8.5 mg/dL — ABNORMAL LOW (ref 8.9–10.3)
Chloride: 94 mmol/L — ABNORMAL LOW (ref 98–111)
Creatinine, Ser: 1.63 mg/dL — ABNORMAL HIGH (ref 0.44–1.00)
GFR, Estimated: 36 mL/min — ABNORMAL LOW (ref >60)
Glucose, Bld: 96 mg/dL (ref 70–99)
Potassium: 4 mmol/L (ref 3.5–5.1)
Sodium: 135 mmol/L (ref 135–145)
Total Bilirubin: 0.7 mg/dL (ref 0.3–1.2)
Total Protein: 7.5 g/dL (ref 6.5–8.1)

## 2023-01-29 LAB — LIPASE, BLOOD: Lipase: 36 U/L (ref 11–51)

## 2023-01-29 LAB — MAGNESIUM: Magnesium: 1.8 mg/dL (ref 1.7–2.4)

## 2023-01-29 LAB — CK: Total CK: 891 U/L — ABNORMAL HIGH (ref 38–234)

## 2023-01-29 LAB — AMMONIA: Ammonia: 53 umol/L — ABNORMAL HIGH (ref 9–35)

## 2023-01-29 LAB — LACTIC ACID, PLASMA: Lactic Acid, Venous: 1.2 mmol/L (ref 0.5–1.9)

## 2023-01-29 MED ORDER — UMECLIDINIUM BROMIDE 62.5 MCG/ACT IN AEPB
1.0000 | INHALATION_SPRAY | Freq: Every day | RESPIRATORY_TRACT | Status: DC
  Administered 2023-01-30 – 2023-02-02 (×4): 1 via RESPIRATORY_TRACT
  Filled 2023-01-29: qty 7

## 2023-01-29 MED ORDER — ALBUTEROL SULFATE (2.5 MG/3ML) 0.083% IN NEBU
2.5000 mg | INHALATION_SOLUTION | RESPIRATORY_TRACT | Status: DC | PRN
  Administered 2023-01-30 – 2023-02-02 (×8): 2.5 mg via RESPIRATORY_TRACT
  Filled 2023-01-29 (×9): qty 3

## 2023-01-29 MED ORDER — ALPRAZOLAM 0.5 MG PO TABS
0.5000 mg | ORAL_TABLET | Freq: Two times a day (BID) | ORAL | Status: DC | PRN
  Administered 2023-01-30 – 2023-02-02 (×7): 0.5 mg via ORAL
  Filled 2023-01-29 (×7): qty 1

## 2023-01-29 MED ORDER — LEVOTHYROXINE SODIUM 88 MCG PO TABS
88.0000 ug | ORAL_TABLET | Freq: Every day | ORAL | Status: DC
  Administered 2023-01-30 – 2023-02-02 (×4): 88 ug via ORAL
  Filled 2023-01-29 (×4): qty 1

## 2023-01-29 MED ORDER — OXYCODONE HCL 5 MG PO TABS
5.0000 mg | ORAL_TABLET | Freq: Three times a day (TID) | ORAL | Status: DC | PRN
  Administered 2023-01-30 – 2023-02-02 (×7): 5 mg via ORAL
  Filled 2023-01-29 (×7): qty 1

## 2023-01-29 MED ORDER — FLUTICASONE FUROATE-VILANTEROL 100-25 MCG/ACT IN AEPB
1.0000 | INHALATION_SPRAY | Freq: Every day | RESPIRATORY_TRACT | Status: DC
  Administered 2023-01-30 – 2023-02-02 (×4): 1 via RESPIRATORY_TRACT
  Filled 2023-01-29: qty 28

## 2023-01-29 MED ORDER — SENNOSIDES-DOCUSATE SODIUM 8.6-50 MG PO TABS: 1.0000 | ORAL_TABLET | Freq: Every evening | ORAL | Status: DC | PRN

## 2023-01-29 MED ORDER — ONDANSETRON HCL 4 MG PO TABS
4.0000 mg | ORAL_TABLET | Freq: Four times a day (QID) | ORAL | Status: DC | PRN
  Filled 2023-01-29: qty 1

## 2023-01-29 MED ORDER — SODIUM CHLORIDE 0.9% FLUSH
3.0000 mL | Freq: Two times a day (BID) | INTRAVENOUS | Status: DC
  Administered 2023-01-29 – 2023-02-01 (×5): 3 mL via INTRAVENOUS

## 2023-01-29 MED ORDER — GABAPENTIN 400 MG PO CAPS
1200.0000 mg | ORAL_CAPSULE | Freq: Every day | ORAL | Status: DC
  Administered 2023-01-29 – 2023-02-01 (×4): 1200 mg via ORAL
  Filled 2023-01-29 (×4): qty 3

## 2023-01-29 MED ORDER — ONDANSETRON HCL 4 MG/2ML IJ SOLN
4.0000 mg | Freq: Four times a day (QID) | INTRAMUSCULAR | Status: DC | PRN
  Administered 2023-01-30 (×2): 4 mg via INTRAVENOUS
  Filled 2023-01-29 (×2): qty 2

## 2023-01-29 MED ORDER — ONDANSETRON HCL 4 MG/2ML IJ SOLN
4.0000 mg | Freq: Once | INTRAMUSCULAR | Status: AC
  Administered 2023-01-29: 4 mg via INTRAVENOUS
  Filled 2023-01-29: qty 2

## 2023-01-29 MED ORDER — MIDODRINE HCL 5 MG PO TABS
10.0000 mg | ORAL_TABLET | Freq: Three times a day (TID) | ORAL | Status: DC
  Administered 2023-01-30 – 2023-02-02 (×10): 10 mg via ORAL
  Filled 2023-01-29 (×10): qty 2

## 2023-01-29 MED ORDER — ALBUTEROL SULFATE HFA 108 (90 BASE) MCG/ACT IN AERS: 2.0000 | INHALATION_SPRAY | RESPIRATORY_TRACT | Status: DC | PRN

## 2023-01-29 MED ORDER — ACETAMINOPHEN 650 MG RE SUPP: 650.0000 mg | Freq: Four times a day (QID) | RECTAL | Status: DC | PRN

## 2023-01-29 MED ORDER — SODIUM CHLORIDE 0.9 % IV BOLUS
1000.0000 mL | Freq: Once | INTRAVENOUS | Status: AC
  Administered 2023-01-29: 1000 mL via INTRAVENOUS

## 2023-01-29 MED ORDER — TRAZODONE HCL 50 MG PO TABS
100.0000 mg | ORAL_TABLET | Freq: Every evening | ORAL | Status: DC | PRN
  Administered 2023-01-30 – 2023-02-01 (×4): 100 mg via ORAL
  Filled 2023-01-29 (×4): qty 2

## 2023-01-29 MED ORDER — HEPARIN SODIUM (PORCINE) 5000 UNIT/ML IJ SOLN
5000.0000 [IU] | Freq: Three times a day (TID) | INTRAMUSCULAR | Status: DC
  Administered 2023-01-29 – 2023-02-02 (×11): 5000 [IU] via SUBCUTANEOUS
  Filled 2023-01-29 (×11): qty 1

## 2023-01-29 MED ORDER — PANTOPRAZOLE SODIUM 40 MG PO TBEC
40.0000 mg | DELAYED_RELEASE_TABLET | Freq: Every day | ORAL | Status: DC
  Administered 2023-01-30 – 2023-02-02 (×4): 40 mg via ORAL
  Filled 2023-01-29 (×4): qty 1

## 2023-01-29 MED ORDER — TIZANIDINE HCL 4 MG PO TABS
4.0000 mg | ORAL_TABLET | Freq: Every evening | ORAL | Status: DC | PRN
  Administered 2023-01-30 – 2023-02-01 (×3): 4 mg via ORAL
  Filled 2023-01-29 (×4): qty 1

## 2023-01-29 MED ORDER — SERTRALINE HCL 50 MG PO TABS
200.0000 mg | ORAL_TABLET | Freq: Every day | ORAL | Status: DC
  Administered 2023-01-30 – 2023-02-02 (×4): 200 mg via ORAL
  Filled 2023-01-29 (×4): qty 4

## 2023-01-29 MED ORDER — ACETAMINOPHEN 325 MG PO TABS: 650.0000 mg | ORAL_TABLET | Freq: Four times a day (QID) | ORAL | Status: DC | PRN

## 2023-01-29 MED ORDER — SODIUM CHLORIDE 0.9 % IV SOLN
12.5000 mg | Freq: Once | INTRAVENOUS | Status: AC
  Administered 2023-01-29: 12.5 mg via INTRAVENOUS
  Filled 2023-01-29: qty 0.5

## 2023-01-29 MED ORDER — ROSUVASTATIN CALCIUM 20 MG PO TABS
20.0000 mg | ORAL_TABLET | Freq: Every day | ORAL | Status: DC
  Filled 2023-01-29: qty 1

## 2023-01-29 MED ORDER — PROMETHAZINE HCL 25 MG/ML IJ SOLN
INTRAMUSCULAR | Status: AC
  Filled 2023-01-29: qty 1

## 2023-01-29 NOTE — H&P (Signed)
History and Physical    LAYNI KREAMER WUJ:[id number] DOB: 1961-05-25 DOA: 01/29/2023  PCP: Hali Marry, MD   Patient coming from: Home   Chief Complaint: Weakness, N/V   HPI: Krystal Burgess is a 62 y.o. female with medical history significant for COPD, chronic hypoxic respiratory failure, hypothyroidism, CKD stage IIIb, depression, anxiety, insomnia, and chronic pain who presents to the emergency department for evaluation of generalized weakness, nausea, and vomiting.   Patient was discharged from the hospital just over a week ago and notes continued and progressive generalized weakness since then.  Discharge to SNF had been recommended but the patient elected to go home instead.  She reports being too generally weak to ambulate without falling, and is even having difficulty getting out of bed on her own.  She also complains of nausea with nonbloody vomiting and dry heaves, stating that she has been unable to keep anything down today.  She attributes this to her hiatal hernia and denies any abdominal pain.  She denies cough or change in her chronic dyspnea.  ED Course: Upon arrival to the ED, patient is found to be afebrile and saturating well on 2 L/min of supplemental oxygen with normal respiratory rate and initial blood pressure 75/59.  Head CT is negative for acute intracranial abnormality.  CT of the cervical spine is a limited study but negative for acute cervical spine pathology.  Left apical lung nodules noted on CT incidentally.  Labs include creatinine 1.63, albumin 2.6, AST 78, hemoglobin 9.1, platelets 572, and lactic acid 1.2.  Blood cultures were collected in the ED given her initial hypotension, 1 L normal saline was administered, and a dose of Zofran given.  Review of Systems:  All other systems reviewed and apart from HPI, are negative.  Past Medical History:  Diagnosis Date   Allergy    Hyperlipidemia    Pain    Thyroid disease     Past Surgical History:   Procedure Laterality Date   CESAREAN SECTION     CHOLECYSTECTOMY     lipoma removal     RT shoulder   LUMBAR SPINE SURGERY  12/23/2011   Dr. Owens Shark, L4-5   pilonydal cyst  12/22/1992   TOTAL ABDOMINAL HYSTERECTOMY  1999   for abnormal cervical cells and fibroids    Social History:   reports that she quit smoking about 2 years ago. Her smoking use included cigarettes. She smoked an average of 1 pack per day. She has never used smokeless tobacco. She reports that she does not currently use alcohol. She reports that she does not use drugs.  Allergies  Allergen Reactions   Risperidone And Related Shortness Of Breath   Aripiprazole Other (See Comments)    REACTION: muscle jerks    Fluoxetine Hcl Other (See Comments)    REACTION: Intolerant   Nsaids Other (See Comments)    Upper GI bleed    Paroxetine Other (See Comments)    REACTION: Intolerance    Ziprasidone Hcl Other (See Comments)    shakes    Chantix [Varenicline Tartrate] Other (See Comments)    "messing with my mood"   Metformin And Related Nausea Only   Montelukast Other (See Comments)    "it kept me sick with an upper respiratory infection"   Onglyza [Saxagliptin] Nausea Only   Augmentin [Amoxicillin-Pot Clavulanate] Other (See Comments)    Gets yeast infection every time    Fluoxetine Hcl     REACTION: Intolerant    Family  History  Problem Relation Age of Onset   Diabetes Brother    Depression Brother        bipolar   Cancer Brother        throat   Diabetes Other        grandmother   Depression Mother    Hyperlipidemia Mother    Hypertension Mother    Pancreatic cancer Mother    Hypertension Father      Prior to Admission medications   Medication Sig Start Date End Date Taking? Authorizing Provider  albuterol (VENTOLIN HFA) 108 (90 Base) MCG/ACT inhaler INHALE 2 PUFFS INTO THE LUNGS EVERY 4 HOURS AS NEEDED FOR WHEEZING OR SHORTNESS OF BREATH. 04/03/21  Yes Agapito Games, MD  ALPRAZolam  Prudy Feeler) 1 MG tablet Take 1 tablet (1 mg total) by mouth 2 (two) times daily as needed. for anxiety 01/01/23  Yes Agapito Games, MD  fluticasone [facility]) 50 MCG/ACT nasal spray SPRAY 2 SPRAYS INTO EACH NOSTRIL EVERY DAY 08/07/21  Yes Agapito Games, MD  folic acid (FOLVITE) 1 MG tablet TAKE 1 TABLET BY MOUTH EVERY DAY 09/02/22  Yes Agapito Games, MD  gabapentin (NEURONTIN) 600 MG tablet Take 1 tablet (600 mg total) by mouth 2 (two) times daily. Patient takes 1200 mg daily Patient taking differently: Take 600 mg by mouth See admin instructions. Patient takes 1200 mg at bedtime 06/02/22  Yes Agapito Games, MD  ipratropium (ATROVENT) 0.06 % nasal spray Place 2 sprays into both nostrils 4 (four) times daily. 02/07/22  Yes Monica Becton, MD  ipratropium-albuterol (DUONEB) 0.5-2.5 (3) MG/3ML SOLN Take 3 mLs by nebulization every 2 (two) hours as needed (shortness of breath). 08/22/22  Yes [provider]  levocetirizine (XYZAL) 5 MG tablet TAKE 1 TABLET BY MOUTH EVERY DAY IN THE EVENING 09/02/22  Yes Agapito Games, MD  levothyroxine (SYNTHROID) 88 MCG tablet Take 1 tablet (88 mcg total) by mouth daily. 11/28/21  Yes Agapito Games, MD  Melatonin 10 MG TABS Take 10-20 tablets by mouth at bedtime as needed (sleep).   Yes [provider]  midodrine (PROAMATINE) 10 MG tablet Take 1 tablet (10 mg total) by mouth 3 (three) times daily with meals. 01/20/23 02/19/23 Yes Shah, Pratik D, DO  ondansetron (ZOFRAN) 8 MG tablet Take 8 mg by mouth 3 (three) times daily. 03/03/22  Yes [provider]  Oxycodone HCl 10 MG TABS Take 1 tablet (10 mg total) by mouth every 8 (eight) hours. Ok to take extra tab daily for the next 10 days after injury 12/16/22  Yes Metheney, Barbarann Ehlers, MD  pantoprazole (PROTONIX) 40 MG tablet TAKE 1 TABLET BY MOUTH EVERY DAY Patient taking differently: Take 40 mg by mouth 2 (two) times daily. 11/05/22  Yes Agapito Games, MD  rosuvastatin (CRESTOR) 20 MG tablet TAKE 1 TABLET BY MOUTH EVERY DAY 09/26/22  Yes Agapito Games, MD  sertraline (ZOLOFT) 100 MG tablet TAKE 2 TABLETS BY MOUTH EVERY DAY 10/29/21  Yes Agapito Games, MD  theophylline (THEODUR) 300 MG 12 hr tablet TAKE 1 TABLET BY MOUTH TWICE A DAY 09/02/22  Yes Agapito Games, MD  tiZANidine (ZANAFLEX) 4 MG tablet TAKE 1 TABLET BY MOUTH AT BEDTIME AS NEEDED FOR MUSCLE SPASMS. Patient taking differently: Take 4 mg by mouth at bedtime as needed for muscle spasms. 11/24/22  Yes Agapito Games, MD  traZODone (DESYREL) 100 MG tablet TAKE 1 TABLET BY MOUTH EVERYDAY AT BEDTIME Patient  taking differently: Take 100 mg by mouth at bedtime as needed for sleep. 01/05/23  Yes Hali Marry, MD  TRELEGY ELLIPTA 100-62.5-25 MCG/ACT AEPB TAKE 1 PUFF BY MOUTH EVERY DAY Patient taking differently: Inhale 1 puff into the lungs daily. 07/28/22  Yes Hali Marry, MD  AMBULATORY NON FORMULARY MEDICATION Medication Name: Portable battery-powered nebulizer.Dx: COPD. [place] [phone]) 12/11/21   Hali Marry, MD  AMBULATORY NON FORMULARY MEDICATION Medication Name: Upright standing Rollator with seat. Dx: code M51.36 12/30/21   Hali Marry, MD  B-D 3CC LUER-LOK SYR 25GX1" 25G X 1" 3 ML MISC Inject into the muscle once a week. Patient not taking: Reported on 01/22/2023 11/23/21   [provider]  ipratropium (ATROVENT) 0.02 % nebulizer solution Take 2.5 mLs (0.5 mg total) by nebulization 3 (three) times daily as needed for wheezing or shortness of breath. Patient not taking: Reported on 01/22/2023 06/27/22   Hali Marry, MD  Iron-FA-B Cmp-C-Biot-Probiotic (FUSION PLUS) CAPS Take 1 capsule by mouth daily. Patient not taking: Reported on 01/22/2023 01/01/22   Hali Marry, MD    Physical Exam: Vitals:   01/29/23 2030 01/29/23 2035 01/29/23 2115 01/29/23 2130  BP:  (!)  114/50  126/64  Pulse: (!) 57 72 (!) 56 66  Resp: 13 19 13 19   Temp:      TempSrc:      SpO2: 97% 98% 100% 95%  Weight:      Height:        Constitutional: NAD, no pallor or cyanosis  Eyes: PERTLA, lids and conjunctivae normal ENMT: Mucous membranes are moist. Posterior pharynx clear of any exudate or lesions.   Neck: supple, no masses  Respiratory: no wheezing, no crackles. No accessory muscle use.  Cardiovascular: S1 & S2 heard, regular rate and rhythm. No significant JVD. Abdomen: No distension, no tenderness, soft. Bowel sounds active.  Musculoskeletal: no clubbing / cyanosis. No joint deformity upper and lower extremities.   Skin: no significant rashes, lesions, ulcers. Warm, dry, well-perfused. Neurologic: CN 2-12 grossly intact. Moving all extremities. Alert and oriented.  Psychiatric: Calm. Cooperative.    Labs and Imaging on Admission: I have personally reviewed following labs and imaging studies  CBC: Recent Labs  Lab 01/29/23 2018  WBC 9.3  NEUTROABS 5.5  HGB 9.1*  HCT 30.9*  MCV 96.9  PLT 403*   Basic Metabolic Panel: Recent Labs  Lab 01/29/23 2018  NA 135  K 4.0  CL 94*  CO2 30  GLUCOSE 96  BUN 10  CREATININE 1.63*  CALCIUM 8.5*  MG 1.8   GFR: Estimated Creatinine Clearance: 45.3 mL/min (A) (by C-G formula based on SCr of 1.63 mg/dL (H)). Liver Function Tests: Recent Labs  Lab 01/29/23 2018  AST 78*  ALT 25  ALKPHOS 82  BILITOT 0.7  PROT 7.5  ALBUMIN 2.6*   Recent Labs  Lab 01/29/23 2018  LIPASE 36   No results for input(s): "AMMONIA" in the last 168 hours. Coagulation Profile: No results for input(s): "INR", "PROTIME" in the last 168 hours. Cardiac Enzymes: No results for input(s): "CKTOTAL", "CKMB", "CKMBINDEX", "TROPONINI" in the last 168 hours. BNP (last 3 results) No results for input(s): "PROBNP" in the last 8760 hours. HbA1C: No results for input(s): "HGBA1C" in the last 72 hours. CBG: No results for input(s): "GLUCAP"  in the last 168 hours. Lipid Profile: No results for input(s): "CHOL", "HDL", "LDLCALC", "TRIG", "CHOLHDL", "LDLDIRECT" in the last 72 hours. Thyroid Function  Tests: No results for input(s): "TSH", "T4TOTAL", "FREET4", "T3FREE", "THYROIDAB" in the last 72 hours. Anemia Panel: No results for input(s): "VITAMINB12", "FOLATE", "FERRITIN", "TIBC", "IRON", "RETICCTPCT" in the last 72 hours. Urine analysis:    Component Value Date/Time   COLORURINE YELLOW 03/02/2021 1449   APPEARANCEUR CLEAR 03/02/2021 1449   LABSPEC 1.009 03/02/2021 1449   PHURINE 6.0 03/02/2021 1449   GLUCOSEU NEGATIVE 03/02/2021 1449   HGBUR NEGATIVE 03/02/2021 1449   HGBUR negative 05/03/2009 1012   BILIRUBINUR NEGATIVE 03/02/2021 1449   BILIRUBINUR neg 01/15/2015 1433   KETONESUR NEGATIVE 03/02/2021 1449   PROTEINUR NEGATIVE 03/02/2021 1449   UROBILINOGEN 0.2 01/15/2015 1433   UROBILINOGEN 0.2 05/03/2009 1012   NITRITE NEGATIVE 03/02/2021 1449   LEUKOCYTESUR NEGATIVE 03/02/2021 1449   Sepsis Labs: @LABRCNTIP (procalcitonin:4,lacticidven:4) ) Recent Results (from the past 240 hour(s))  Culture, blood (routine x 2)     Status: None (Preliminary result)   Collection Time: 01/29/23  9:08 PM   Specimen: BLOOD  Result Value Ref Range Status   Specimen Description BLOOD RIGHT ANTECUBITAL  Final   Special Requests   Final    BOTTLES DRAWN AEROBIC AND ANAEROBIC Blood Culture adequate volume Performed at [facility], [address] [place], [place] [address]    Culture PENDING  Incomplete   Report Status PENDING  Incomplete  Culture, blood (routine x 2)     Status: None (Preliminary result)   Collection Time: 01/29/23  9:08 PM   Specimen: BLOOD  Result Value Ref Range Status   Specimen Description BLOOD BLOOD RIGHT HAND  Final   Special Requests   Final    BOTTLES DRAWN AEROBIC AND ANAEROBIC Blood Culture results may not be optimal due to an inadequate volume of blood received in culture bottles Performed at  [facility], [address], [place], [place] [address]    Culture PENDING  Incomplete   Report Status PENDING  Incomplete     Radiological Exams on Admission: CT Head Wo Contrast  Result Date: 01/29/2023 CLINICAL DATA:  Fall with head and neck trauma. EXAM: CT HEAD WITHOUT CONTRAST CT CERVICAL SPINE WITHOUT CONTRAST TECHNIQUE: Multidetector CT imaging of the head and cervical spine was performed following the standard protocol without intravenous contrast. Multiplanar CT image reconstructions of the cervical spine were also generated. RADIATION DOSE REDUCTION: This exam was performed according to the departmental dose-optimization program which includes automated exposure control, adjustment of the mA and/or kV according to patient size and/or use of iterative reconstruction technique. COMPARISON:  CT head 03/02/2021. No prior cervical spine films or cross-sectional imaging. FINDINGS: CT HEAD FINDINGS Brain: No evidence of acute infarction, hemorrhage, hydrocephalus, extra-axial collection or mass lesion/mass effect. There is mild cerebral atrophy and early small-vessel disease in the cerebral white matter. Cerebellum and brainstem are unremarkable. Vascular: No hyperdense vessel or unexpected calcification. Skull: Negative for fractures or focal lesions. No scalp hematoma is seen. Sinuses/Orbits: No acute orbital findings. Old lens extractions. Nasal septum deviates broadly to the right. There is mild membrane thickening in the left maxillary sinus. Other visualized sinuses, left mastoid air cells are clear. There is chronic fluid in the right mastoid tip. Other: None. CT CERVICAL SPINE FINDINGS Technical note: Very limited image detail below the level of C4. This is due to a combination of patient motion artifact, superimposition of the patient's shoulders causing beam hardening and loss of image detail, and patient habitus. Alignment: Slightly reversed cervical lordosis, minimal levoscoliosis. There is  slight anterolisthesis at C5-6 which probably due  to discogenic degenerative arthrosis. No traumatic listhesis is suspected. No other listhesis is seen. Narrowing and spurring noted anterior atlantodental joint. 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 As above very limited study for fracture detection below C4. No obvious compression fracture is seen or displaced fractures. No primary pathologic process is grossly suspected. Soft tissues and spinal canal: Spinal canal contents obscured due to beam hardening and motion below C4. Above C4-5, there is no visible canal hematoma. There is no precervical soft tissue swelling. There is marked fatty infiltration of the parotid glands. There are calcifications in the left-greater-than-right proximal cervical ICAs. No laryngeal or thyroid mass is seen. Disc levels: Not well evaluated. There is moderate disc space loss at C5-6 and C7-T1, otherwise gross preservation of the normal disc heights. There is mild spondylosis. Possible there could be spinal stenosis due to posterior disc osteophyte complexes at C5-6, C6-7 and C7-T1 but this is speculative at best given the degree of obscuration of the spinal canal contents. There are mild facet spurring changes but no significant bony foraminal encroachment. Upper chest: Ill-defined airspace consolidation medial left lung apex. Separate from this there is an 8 mm left apical nodule on 4:82, not seen on the CTA chest from 03/02/2021. Right lung apex is clear. Other: None. IMPRESSION: 1. No acute intracranial CT findings or depressed skull fractures. 2. Mild atrophy and early small-vessel disease. 3. Very limited cervical spine CT due to patient motion, superimposition of the patient's shoulders, and beam hardening. No obvious compression fractures or displaced fractures are seen. 4. Cervical degenerative changes with slight anterolisthesis at C5-6, probably due to discogenic  degenerative arthrosis. Slight cervical kypholevoscoliosis. 5. 8 mm left apical nodule, not seen on the CTA chest from 03/02/2021. Possible this could be an inflammatory nodule given the finding of adjacent airspace disease, but nonspecific. 6. Non-contrast chest CT at 6-12 months is recommended. If the nodule is stable at time of repeat CT, then future CT at 18-24 months (from today's scan) is considered optional for low-risk patients, but is recommended for high-risk patients. This recommendation follows the consensus statement: Guidelines for Management of Incidental Pulmonary Nodules Detected on CT Images: From the Fleischner Society 2017; Radiology 2017; 284:228-243. 7. Ill-defined airspace consolidation medial left lung apex. Correlate clinically for pneumonia. Follow-up as indicated. 8. Vascular calcifications left-greater-than-right proximal cervical ICAs. Electronically Signed   By: Almira Bar M.D.   On: 01/29/2023 20:59   CT Cervical Spine Wo Contrast  Result Date: 01/29/2023 CLINICAL DATA:  Fall with head and neck trauma. EXAM: CT HEAD WITHOUT CONTRAST CT CERVICAL SPINE WITHOUT CONTRAST TECHNIQUE: Multidetector CT imaging of the head and cervical spine was performed following the standard protocol without intravenous contrast. Multiplanar CT image reconstructions of the cervical spine were also generated. RADIATION DOSE REDUCTION: This exam was performed according to the departmental dose-optimization program which includes automated exposure control, adjustment of the mA and/or kV according to patient size and/or use of iterative reconstruction technique. COMPARISON:  CT head 03/02/2021. No prior cervical spine films or cross-sectional imaging. FINDINGS: CT HEAD FINDINGS Brain: No evidence of acute infarction, hemorrhage, hydrocephalus, extra-axial collection or mass lesion/mass effect. There is mild cerebral atrophy and early small-vessel disease in the cerebral white matter. Cerebellum and  brainstem are unremarkable. Vascular: No hyperdense vessel or unexpected calcification. Skull: Negative for fractures or focal lesions. No scalp hematoma is seen. Sinuses/Orbits: No acute orbital findings. Old lens extractions. Nasal septum deviates broadly  to the right. There is mild membrane thickening in the left maxillary sinus. Other visualized sinuses, left mastoid air cells are clear. There is chronic fluid in the right mastoid tip. Other: None. CT CERVICAL SPINE FINDINGS Technical note: Very limited image detail below the level of C4. This is due to a combination of patient motion artifact, superimposition of the patient's shoulders causing beam hardening and loss of image detail, and patient habitus. Alignment: Slightly reversed cervical lordosis, minimal levoscoliosis. There is slight anterolisthesis at C5-6 which probably due to discogenic degenerative arthrosis. No traumatic listhesis is suspected. No other listhesis is seen. Narrowing and spurring noted anterior atlantodental joint. 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 As above very limited study for fracture detection below C4. No obvious compression fracture is seen or displaced fractures. No primary pathologic process is grossly suspected. Soft tissues and spinal canal: Spinal canal contents obscured due to beam hardening and motion below C4. Above C4-5, there is no visible canal hematoma. There is no precervical soft tissue swelling. There is marked fatty infiltration of the parotid glands. There are calcifications in the left-greater-than-right proximal cervical ICAs. No laryngeal or thyroid mass is seen. Disc levels: Not well evaluated. There is moderate disc space loss at C5-6 and C7-T1, otherwise gross preservation of the normal disc heights. There is mild spondylosis. Possible there could be spinal stenosis due to posterior disc osteophyte complexes at C5-6, C6-7 and C7-T1 but this is  speculative at best given the degree of obscuration of the spinal canal contents. There are mild facet spurring changes but no significant bony foraminal encroachment. Upper chest: Ill-defined airspace consolidation medial left lung apex. Separate from this there is an 8 mm left apical nodule on 4:82, not seen on the CTA chest from 03/02/2021. Right lung apex is clear. Other: None. IMPRESSION: 1. No acute intracranial CT findings or depressed skull fractures. 2. Mild atrophy and early small-vessel disease. 3. Very limited cervical spine CT due to patient motion, superimposition of the patient's shoulders, and beam hardening. No obvious compression fractures or displaced fractures are seen. 4. 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 Slight cervical kypholevoscoliosis. 5. 8 mm left apical nodule, not seen on the CTA chest from 03/02/2021. Possible this could be an inflammatory nodule given the finding of adjacent airspace disease, but nonspecific. 6. Non-contrast chest CT at 6-12 months is recommended. If the nodule is stable at time of repeat CT, then future CT at 18-24 months (from today's scan) is considered optional for low-risk patients, but is recommended for high-risk patients. This recommendation follows the consensus statement: Guidelines for Management of Incidental Pulmonary Nodules Detected on CT Images: From the Fleischner Society 2017; Radiology 2017; 284:228-243. 7. Ill-defined airspace consolidation medial left lung apex. Correlate clinically for pneumonia. Follow-up as indicated. 8. Vascular calcifications left-greater-than-right proximal cervical ICAs. Electronically Signed   By: Telford Nab M.D.   On: 01/29/2023 20:59   DG Chest Port 1 View  Result Date: 01/29/2023 CLINICAL DATA:  Weakness and shortness of breath. EXAM: PORTABLE CHEST 1 VIEW COMPARISON:  Chest radiograph dated 01/15/2023. FINDINGS: There is mild  diffuse chronic interstitial coarsening. Faint area of increased density in the subpleural right upper lobe, may represent chronic changes and sequela prior inflammation/infection. However, this has slightly progressed since the prior radiograph and concerning for recurrent pneumonia. Additional scattered smaller nodular densities improved since the prior radiograph. No pleural effusion or  pneumothorax. The cardiac silhouette is within normal limits. No acute osseous pathology. IMPRESSION: Persistent opacity in the subpleural right upper lobe. Additional scattered nodular densities improved since the prior radiograph. Continued follow-up recommended. Electronically Signed   By: Elgie Collard M.D.   On: 01/29/2023 20:42    EKG: Independently reviewed. Sinus rhythm.   Assessment/Plan   1. Generalized weakness, FTT  - Presents with ongoing/progressive general weakness, now unable to get out of bed  - No focal deficits noted and no acute findings on head CT  - TSH was elevated recently with normal free T4  - Polypharmacy could be contributing  - Continue oxycodone, gabapentin, and Xanax with dose reduction, check B12, CK, and ammonia levels, consult PT   2. Nausea, vomiting  - Pt with chronic nausea reports recent worsening, unable to tolerate anything by mouth today  - Exam is benign  - Hold theophylline, check KUB, continue supportive measures, monitor electrolytes, trial clears as she improves and advance diet as tolerated   3. COPD; chronic hypoxic respiratory failure  - Stable on her usual FiO2 without change in chronic cough or dyspnea  - Continue inhalers, supplemental O2    4. Hypotension  - Antihypertensives discontinued last month and midodrine started  - Appears well-perfused and has normal lactate  - Hypovolemia likely contributing in setting of N/V, BP improved with IVF bolus in ED  - Continue midodrine   5. Depression, anxiety, insomnia  - Continue Zoloft, prn Xanax,  trazodone    6. Hypothyroidism  - Continue Synthroid   7. Lung nodule  - Nodule noted in left apex on CT in ED  - Outpatient follow-up recommended    8. CKD 3b  - Appears to be at baseline  - Renally-dose medications, monitor    DVT prophylaxis: sq heparin  Code Status: Full  Level of Care: Level of care: Telemetry Family Communication: none present  Disposition Plan:  Patient is from: Home  Anticipated d/c is to: SNF  Anticipated d/c date is: Possibly as early as 01/30/23  Patient currently: Pending stable BP, tolerance of adequate oral intake  Consults called: none  Admission status: Observation     Briscoe Deutscher, MD [facility]  01/29/2023, 9:56 PM

## 2023-01-29 NOTE — ED Notes (Signed)
Pharmacy at bedside

## 2023-01-29 NOTE — ED Notes (Signed)
Provider at bedside

## 2023-01-29 NOTE — ED Triage Notes (Signed)
Pt BIB [facility] from home for continued weakness after being discharged from hospital for sepsis and pnu. Pt currently wears 2L [place] chronically.   Pt states she fell the night she got discharged on 1/31, pt c/o posterior neck and head pain since fall.   Pt given 4mg  zofran IM.

## 2023-01-29 NOTE — ED Provider Notes (Signed)
[facility] Provider Note   CSN: [id number] Arrival date & time: 01/29/23  1946     History  Chief Complaint  Patient presents with   Weakness    Krystal Burgess is a 62 y.o. female.  He was just discharged from the hospital about a week ago after being admitted for sepsis pneumonia UTI.  It sounds like she was offered rehab but she declined.  She said the day she went home she fell backwards down a few steps hit her head and injured her neck.  She is felt generally weak all over has been unable to ambulate, nausea and vomiting not holding anything down.  She feels a little short of breath.  She does not think she has had a fever.  She continues to use her home oxygen.  The history is provided by the patient and the EMS personnel.  Weakness Severity:  Severe Onset quality:  Gradual Duration:  1 week Timing:  Constant Progression:  Worsening Chronicity:  Recurrent Context: recent infection   Relieved by:  Nothing Worsened by:  Activity Ineffective treatments:  Rest Associated symptoms: cough, difficulty walking, falls, headaches, nausea, shortness of breath and vomiting   Associated symptoms: no abdominal pain, no chest pain and no fever        Home Medications Prior to Admission medications   Medication Sig Start Date End Date Taking? Authorizing Provider  albuterol (VENTOLIN HFA) 108 (90 Base) MCG/ACT inhaler INHALE 2 PUFFS INTO THE LUNGS EVERY 4 HOURS AS NEEDED FOR WHEEZING OR SHORTNESS OF BREATH. 04/03/21   Hali Marry, MD  ALPRAZolam Duanne Moron) 1 MG tablet Take 1 tablet (1 mg total) by mouth 2 (two) times daily as needed. for anxiety 01/01/23   Hali Marry, MD  AMBULATORY NON FORMULARY MEDICATION Medication Name: Portable battery-powered nebulizer.Dx: COPD. [place] [phone]) 12/11/21   Hali Marry, MD  AMBULATORY NON FORMULARY MEDICATION Medication Name: Upright  standing Rollator with seat. Dx: code M51.36 12/30/21   Hali Marry, MD  B-D 3CC LUER-LOK SYR 25GX1" 25G X 1" 3 ML MISC Inject into the muscle once a week. Patient not taking: Reported on 01/22/2023 11/23/21   [provider]  fluticasone (FLONASE) 50 MCG/ACT nasal spray SPRAY 2 SPRAYS INTO EACH NOSTRIL EVERY DAY 08/07/21   Hali Marry, MD  folic acid (FOLVITE) 1 MG tablet TAKE 1 TABLET BY MOUTH EVERY DAY 09/02/22   Hali Marry, MD  gabapentin (NEURONTIN) 600 MG tablet Take 1 tablet (600 mg total) by mouth 2 (two) times daily. Patient takes 1200 mg daily Patient taking differently: Take 600 mg by mouth See admin instructions. Patient takes 1200 mg at bedtime 06/02/22   Hali Marry, MD  ipratropium (ATROVENT) 0.02 % nebulizer solution Take 2.5 mLs (0.5 mg total) by nebulization 3 (three) times daily as needed for wheezing or shortness of breath. Patient not taking: Reported on 01/22/2023 06/27/22   Hali Marry, MD  ipratropium (ATROVENT) 0.06 % nasal spray Place 2 sprays into both nostrils 4 (four) times daily. 02/07/22   Silverio Decamp, MD  ipratropium-albuterol (DUONEB) 0.5-2.5 (3) MG/3ML SOLN Take 3 mLs by nebulization every 2 (two) hours as needed (shortness of breath). 08/22/22   [provider]  Iron-FA-B Cmp-C-Biot-Probiotic (FUSION PLUS) CAPS Take 1 capsule by mouth daily. Patient not taking: Reported on 01/22/2023 01/01/22   Hali Marry, MD  levocetirizine (XYZAL) 5 MG tablet TAKE 1  TABLET BY MOUTH EVERY DAY IN THE EVENING 09/02/22   Hali Marry, MD  levothyroxine (SYNTHROID) 88 MCG tablet Take 1 tablet (88 mcg total) by mouth daily. 11/28/21   Hali Marry, MD  midodrine (PROAMATINE) 10 MG tablet Take 1 tablet (10 mg total) by mouth 3 (three) times daily with meals. 01/20/23 02/19/23  Manuella Ghazi, Pratik D, DO  ondansetron (ZOFRAN) 8 MG tablet Take 8 mg by mouth 3 (three) times daily. 03/03/22   [provider]  Oxycodone HCl 10 MG TABS Take 1 tablet (10 mg total) by mouth every 8 (eight) hours. Ok to take extra tab daily for the next 10 days after injury 12/16/22   Hali Marry, MD  pantoprazole (PROTONIX) 40 MG tablet TAKE 1 TABLET BY MOUTH EVERY DAY 11/05/22   Hali Marry, MD  promethazine (PHENERGAN) 25 MG tablet TAKE 1 TABLET BY MOUTH 2 TIMES A DAY AS NEEDED FOR NAUSEA OR VOMITING 01/30/22   Hali Marry, MD  rosuvastatin (CRESTOR) 20 MG tablet TAKE 1 TABLET BY MOUTH EVERY DAY 09/26/22   Hali Marry, MD  sertraline (ZOLOFT) 100 MG tablet TAKE 2 TABLETS BY MOUTH EVERY DAY 10/29/21   Hali Marry, MD  theophylline (THEODUR) 300 MG 12 hr tablet TAKE 1 TABLET BY MOUTH TWICE A DAY 09/02/22   Hali Marry, MD  tiZANidine (ZANAFLEX) 4 MG tablet TAKE 1 TABLET BY MOUTH AT BEDTIME AS NEEDED FOR MUSCLE SPASMS. Patient taking differently: Take 4 mg by mouth at bedtime as needed for muscle spasms. 11/24/22   Hali Marry, MD  traZODone (DESYREL) 100 MG tablet TAKE 1 TABLET BY MOUTH EVERYDAY AT BEDTIME Patient taking differently: Take 100 mg by mouth at bedtime as needed for sleep. 01/05/23   Hali Marry, MD  TRELEGY ELLIPTA 100-62.5-25 MCG/ACT AEPB TAKE 1 PUFF BY MOUTH EVERY DAY Patient taking differently: Inhale 1 puff into the lungs daily. 07/28/22   Hali Marry, MD      Allergies    Risperidone and related, Aripiprazole, Fluoxetine hcl, Nsaids, Paroxetine, Ziprasidone hcl, Chantix [varenicline tartrate], Metformin and related, Montelukast, Onglyza [saxagliptin], Augmentin [amoxicillin-pot clavulanate], and Fluoxetine hcl    Review of Systems   Review of Systems  Constitutional:  Negative for fever.  Respiratory:  Positive for cough and shortness of breath.   Cardiovascular:  Negative for chest pain.  Gastrointestinal:  Positive for nausea and vomiting. Negative for abdominal pain.  Musculoskeletal:  Positive for falls.   Neurological:  Positive for weakness and headaches.    Physical Exam Updated Vital Signs Pulse 68   Resp 20   Ht 5' 4"$  (1.626 m)   Wt 111.3 kg   SpO2 94%   BMI 42.12 kg/m  Physical Exam Vitals and nursing note reviewed.  Constitutional:      General: She is not in acute distress.    Appearance: Normal appearance. She is well-developed. She is obese.  HENT:     Head: Normocephalic and atraumatic.  Eyes:     Conjunctiva/sclera: Conjunctivae normal.  Cardiovascular:     Rate and Rhythm: Normal rate and regular rhythm.     Heart sounds: No murmur heard. Pulmonary:     Effort: Pulmonary effort is normal. No respiratory distress.     Breath sounds: Wheezing (few scattered) present.  Abdominal:     Palpations: Abdomen is soft.     Tenderness: There is no abdominal tenderness. There is no guarding or rebound.  Musculoskeletal:  General: No deformity.     Cervical back: Neck supple.  Skin:    General: Skin is warm and dry.     Capillary Refill: Capillary refill takes less than 2 seconds.  Neurological:     General: No focal deficit present.     Mental Status: She is alert.     Motor: No weakness.     Comments: Is awake and alert and oriented.  She is moving all extremities without difficulty but is globally weak.     ED Results / Procedures / Treatments   Labs (all labs ordered are listed, but only abnormal results are displayed) Labs Reviewed  COMPREHENSIVE METABOLIC PANEL - Abnormal; Notable for the following components:      Result Value   Chloride 94 (*)    Creatinine, Ser 1.63 (*)    Calcium 8.5 (*)    Albumin 2.6 (*)    AST 78 (*)    GFR, Estimated 36 (*)    All other components within normal limits  CBC WITH DIFFERENTIAL/PLATELET - Abnormal; Notable for the following components:   RBC 3.19 (*)    Hemoglobin 9.1 (*)    HCT 30.9 (*)    MCHC 29.4 (*)    Platelets 572 (*)    All other components within normal limits  URINALYSIS, ROUTINE W REFLEX  MICROSCOPIC - Abnormal; Notable for the following components:   Hgb urine dipstick SMALL (*)    Bacteria, UA RARE (*)    All other components within normal limits  COMPREHENSIVE METABOLIC PANEL - Abnormal; Notable for the following components:   Creatinine, Ser 1.59 (*)    Calcium 8.6 (*)    Albumin 2.5 (*)    AST 71 (*)    GFR, Estimated 37 (*)    All other components within normal limits  CBC - Abnormal; Notable for the following components:   RBC 3.00 (*)    Hemoglobin 8.6 (*)    HCT 29.1 (*)    MCHC 29.6 (*)    Platelets 531 (*)    All other components within normal limits  CK - Abnormal; Notable for the following components:   Total CK 891 (*)    All other components within normal limits  AMMONIA - Abnormal; Notable for the following components:   Ammonia 53 (*)    All other components within normal limits  CULTURE, BLOOD (ROUTINE X 2)  CULTURE, BLOOD (ROUTINE X 2)  RESP PANEL BY RT-PCR (RSV, FLU A&B, COVID)  RVPGX2  C DIFFICILE QUICK SCREEN W PCR REFLEX    GASTROINTESTINAL PANEL BY PCR, STOOL (REPLACES STOOL CULTURE)  MAGNESIUM  LIPASE, BLOOD  LACTIC ACID, PLASMA  VITAMIN B12  CORTISOL-AM, BLOOD  PROCALCITONIN  FOLATE    EKG EKG Interpretation  Date/Time:  Thursday January 29 2023 20:13:42 EST Ventricular Rate:  54 PR Interval:  167 QRS Duration: 94 QT Interval:  457 QTC Calculation: 434 R Axis:   -6 Text Interpretation: Sinus rhythm Abnormal R-wave progression, early transition rate slower than prior 1/24 Confirmed by Aletta Edouard [phone]) on 01/29/2023 8:18:11 PM  Radiology DG Abd 1 View  Result Date: 01/29/2023 CLINICAL DATA:  Nausea and vomiting EXAM: ABDOMEN - 1 VIEW COMPARISON:  None Available. FINDINGS: 2 supine frontal views of the abdomen and pelvis are obtained. No bowel obstruction or ileus. No masses or abnormal calcifications. No acute bony abnormality. Patchy consolidation at the left lung base. IMPRESSION: 1. Unremarkable bowel gas pattern. 2.  Patchy left basilar consolidation consistent with  pneumonia. Electronically Signed   By: Randa Ngo M.D.   On: 01/29/2023 23:15   CT Head Wo Contrast  Result Date: 01/29/2023 CLINICAL DATA:  Fall with head and neck trauma. EXAM: CT HEAD WITHOUT CONTRAST CT CERVICAL SPINE WITHOUT CONTRAST TECHNIQUE: Multidetector CT imaging of the head and cervical spine was performed following the standard protocol without intravenous contrast. Multiplanar CT image reconstructions of the cervical spine were also generated. RADIATION DOSE REDUCTION: This exam was performed according to the departmental dose-optimization program which includes automated exposure control, adjustment of the mA and/or kV according to patient size and/or use of iterative reconstruction technique. COMPARISON:  CT head 03/02/2021. No prior cervical spine films or cross-sectional imaging. FINDINGS: CT HEAD FINDINGS Brain: No evidence of acute infarction, hemorrhage, hydrocephalus, extra-axial collection or mass lesion/mass effect. There is mild cerebral atrophy and early small-vessel disease in the cerebral white matter. Cerebellum and brainstem are unremarkable. Vascular: No hyperdense vessel or unexpected calcification. Skull: Negative for fractures or focal lesions. No scalp hematoma is seen. Sinuses/Orbits: No acute orbital findings. Old lens extractions. Nasal septum deviates broadly to the right. There is mild membrane thickening in the left maxillary sinus. Other visualized sinuses, left mastoid air cells are clear. There is chronic fluid in the right mastoid tip. Other: None. CT CERVICAL SPINE FINDINGS Technical note: Very limited image detail below the level of C4. This is due to a combination of patient motion artifact, superimposition of the patient's shoulders causing beam hardening and loss of image detail, and patient habitus. Alignment: Slightly reversed cervical lordosis, minimal levoscoliosis. There is slight anterolisthesis at C5-6  which probably due to discogenic degenerative arthrosis. No traumatic listhesis is suspected. No other listhesis is seen. Narrowing and spurring noted anterior atlantodental joint. 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 As above very limited study for fracture detection below C4. No obvious compression fracture is seen or displaced fractures. No primary pathologic process is grossly suspected. Soft tissues and spinal canal: Spinal canal contents obscured due to beam hardening and motion below C4. Above C4-5, there is no visible canal hematoma. There is no precervical soft tissue swelling. There is marked fatty infiltration of the parotid glands. There are calcifications in the left-greater-than-right proximal cervical ICAs. No laryngeal or thyroid mass is seen. Disc levels: Not well evaluated. There is moderate disc space loss at C5-6 and C7-T1, otherwise gross preservation of the normal disc heights. There is mild spondylosis. Possible there could be spinal stenosis due to posterior disc osteophyte complexes at C5-6, C6-7 and C7-T1 but this is speculative at best given the degree of obscuration of the spinal canal contents. There are mild facet spurring changes but no significant bony foraminal encroachment. Upper chest: Ill-defined airspace consolidation medial left lung apex. Separate from this there is an 8 mm left apical nodule on 4:82, not seen on the CTA chest from 03/02/2021. Right lung apex is clear. Other: None. IMPRESSION: 1. No acute intracranial CT findings or depressed skull fractures. 2. Mild atrophy and early small-vessel disease. 3. Very limited cervical spine CT due to patient motion, superimposition of the patient's shoulders, and beam hardening. No obvious compression fractures or displaced fractures are seen. 4. 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 Slight  cervical kypholevoscoliosis. 5. 8 mm left apical nodule, not seen on the CTA chest from 03/02/2021. Possible this could be an inflammatory nodule given the finding of adjacent airspace  disease, but nonspecific. 6. Non-contrast chest CT at 6-12 months is recommended. If the nodule is stable at time of repeat CT, then future CT at 18-24 months (from today's scan) is considered optional for low-risk patients, but is recommended for high-risk patients. This recommendation follows the consensus statement: Guidelines for Management of Incidental Pulmonary Nodules Detected on CT Images: From the Fleischner Society 2017; Radiology 2017; 284:228-243. 7. Ill-defined airspace consolidation medial left lung apex. Correlate clinically for pneumonia. Follow-up as indicated. 8. Vascular calcifications left-greater-than-right proximal cervical ICAs. Electronically Signed   By: Telford Nab M.D.   On: 01/29/2023 20:59   CT Cervical Spine Wo Contrast  Result Date: 01/29/2023 CLINICAL DATA:  Fall with head and neck trauma. EXAM: CT HEAD WITHOUT CONTRAST CT CERVICAL SPINE WITHOUT CONTRAST TECHNIQUE: Multidetector CT imaging of the head and cervical spine was performed following the standard protocol without intravenous contrast. Multiplanar CT image reconstructions of the cervical spine were also generated. RADIATION DOSE REDUCTION: This exam was performed according to the departmental dose-optimization program which includes automated exposure control, adjustment of the mA and/or kV according to patient size and/or use of iterative reconstruction technique. COMPARISON:  CT head 03/02/2021. No prior cervical spine films or cross-sectional imaging. FINDINGS: CT HEAD FINDINGS Brain: No evidence of acute infarction, hemorrhage, hydrocephalus, extra-axial collection or mass lesion/mass effect. There is mild cerebral atrophy and early small-vessel disease in the cerebral white matter. Cerebellum and brainstem are unremarkable.  Vascular: No hyperdense vessel or unexpected calcification. Skull: Negative for fractures or focal lesions. No scalp hematoma is seen. Sinuses/Orbits: No acute orbital findings. Old lens extractions. Nasal septum deviates broadly to the right. There is mild membrane thickening in the left maxillary sinus. Other visualized sinuses, left mastoid air cells are clear. There is chronic fluid in the right mastoid tip. Other: None. CT CERVICAL SPINE FINDINGS Technical note: Very limited image detail below the level of C4. This is due to a combination of patient motion artifact, superimposition of the patient's shoulders causing beam hardening and loss of image detail, and patient habitus. Alignment: Slightly reversed cervical lordosis, minimal levoscoliosis. There is slight anterolisthesis at C5-6 which probably due to discogenic degenerative arthrosis. No traumatic listhesis is suspected. No other listhesis is seen. Narrowing and spurring noted anterior atlantodental joint. 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 As above very limited study for fracture detection below C4. No obvious compression fracture is seen or displaced fractures. No primary pathologic process is grossly suspected. Soft tissues and spinal canal: Spinal canal contents obscured due to beam hardening and motion below C4. Above C4-5, there is no visible canal hematoma. There is no precervical soft tissue swelling. There is marked fatty infiltration of the parotid glands. There are calcifications in the left-greater-than-right proximal cervical ICAs. No laryngeal or thyroid mass is seen. Disc levels: Not well evaluated. There is moderate disc space loss at C5-6 and C7-T1, otherwise gross preservation of the normal disc heights. There is mild spondylosis. Possible there could be spinal stenosis due to posterior disc osteophyte complexes at C5-6, C6-7 and C7-T1 but this is speculative at best given the  degree of obscuration of the spinal canal contents. There are mild facet spurring changes but no significant bony foraminal encroachment. Upper chest: Ill-defined airspace consolidation medial left lung apex. Separate from this there is an 8 mm left apical nodule on 4:82, not seen on the CTA chest from 03/02/2021. Right lung apex is clear. Other: None.  IMPRESSION: 1. No acute intracranial CT findings or depressed skull fractures. 2. Mild atrophy and early small-vessel disease. 3. Very limited cervical spine CT due to patient motion, superimposition of the patient's shoulders, and beam hardening. No obvious compression fractures or displaced fractures are seen. 4. 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 Slight cervical kypholevoscoliosis. 5. 8 mm left apical nodule, not seen on the CTA chest from 03/02/2021. Possible this could be an inflammatory nodule given the finding of adjacent airspace disease, but nonspecific. 6. Non-contrast chest CT at 6-12 months is recommended. If the nodule is stable at time of repeat CT, then future CT at 18-24 months (from today's scan) is considered optional for low-risk patients, but is recommended for high-risk patients. This recommendation follows the consensus statement: Guidelines for Management of Incidental Pulmonary Nodules Detected on CT Images: From the Fleischner Society 2017; Radiology 2017; 284:228-243. 7. Ill-defined airspace consolidation medial left lung apex. Correlate clinically for pneumonia. Follow-up as indicated. 8. Vascular calcifications left-greater-than-right proximal cervical ICAs. Electronically Signed   By: Telford Nab M.D.   On: 01/29/2023 20:59   DG Chest Port 1 View  Result Date: 01/29/2023 CLINICAL DATA:  Weakness and shortness of breath. EXAM: PORTABLE CHEST 1 VIEW COMPARISON:  Chest radiograph dated 01/15/2023. FINDINGS: There is mild diffuse chronic interstitial  coarsening. Faint area of increased density in the subpleural right upper lobe, may represent chronic changes and sequela prior inflammation/infection. However, this has slightly progressed since the prior radiograph and concerning for recurrent pneumonia. Additional scattered smaller nodular densities improved since the prior radiograph. No pleural effusion or pneumothorax. The cardiac silhouette is within normal limits. No acute osseous pathology. IMPRESSION: Persistent opacity in the subpleural right upper lobe. Additional scattered nodular densities improved since the prior radiograph. Continued follow-up recommended. Electronically Signed   By: Anner Crete M.D.   On: 01/29/2023 20:42    Procedures Procedures    Medications Ordered in ED Medications  midodrine (PROAMATINE) tablet 10 mg (10 mg Oral Given 01/30/23 0809)  sertraline (ZOLOFT) tablet 200 mg (200 mg Oral Given 01/30/23 0809)  traZODone (DESYREL) tablet 100 mg (100 mg Oral Given 01/30/23 0006)  levothyroxine (SYNTHROID) tablet 88 mcg (88 mcg Oral Given 01/30/23 0546)  pantoprazole (PROTONIX) EC tablet 40 mg (40 mg Oral Given 01/30/23 0809)  gabapentin (NEURONTIN) capsule 1,200 mg (1,200 mg Oral Given 01/29/23 2250)  tiZANidine (ZANAFLEX) tablet 4 mg (has no administration in time range)  oxyCODONE (Oxy IR/ROXICODONE) immediate release tablet 5 mg (5 mg Oral Given 01/30/23 0809)  ALPRAZolam (XANAX) tablet 0.5 mg (has no administration in time range)  fluticasone furoate-vilanterol (BREO ELLIPTA) 100-25 MCG/ACT 1 puff (1 puff Inhalation Given 01/30/23 0814)    And  umeclidinium bromide (INCRUSE ELLIPTA) 62.5 MCG/ACT 1 puff (1 puff Inhalation Given 01/30/23 0814)  heparin injection 5,000 Units (5,000 Units Subcutaneous Given 01/30/23 0546)  sodium chloride flush (NS) 0.9 % injection 3 mL (3 mLs Intravenous Given 01/30/23 0812)  acetaminophen (TYLENOL) tablet 650 mg (has no administration in time range)    Or  acetaminophen (TYLENOL) suppository 650  mg (has no administration in time range)  senna-docusate (Senokot-S) tablet 1 tablet (has no administration in time range)  ondansetron (ZOFRAN) tablet 4 mg ( Oral See Alternative 01/30/23 0545)    Or  ondansetron (ZOFRAN) injection 4 mg (4 mg Intravenous Given 01/30/23 0545)  albuterol (PROVENTIL) (2.5 MG/3ML) 0.083% nebulizer solution 2.5 mg (2.5 mg Nebulization Given 01/30/23 0814)  metoCLOPramide (  REGLAN) injection 5 mg (5 mg Intravenous Given 01/30/23 0809)  ondansetron (ZOFRAN) injection 4 mg (has no administration in time range)  lactated ringers infusion ( Intravenous New Bag/Given 01/30/23 0818)  iohexol (OMNIPAQUE) 9 MG/ML oral solution (has no administration in time range)  sodium chloride 0.9 % bolus 1,000 mL (0 mLs Intravenous Stopped 01/29/23 2144)  ondansetron (ZOFRAN) injection 4 mg (4 mg Intravenous Given 01/29/23 2108)  promethazine (PHENERGAN) 12.5 mg in sodium chloride 0.9 % 50 mL IVPB (0 mg Intravenous Stopped 01/29/23 2254)    ED Course/ Medical Decision Making/ A&P Clinical Course as of 01/30/23 [id number]  Thu Jan 29, 2023  2042 Asked x-ray does not show definite infiltrate but there is a possible mass in the right upper chest.  Waiting radiology reading. [MB]  2126 Discussed with Dr. Myna Hidalgo [facility] hospitalist who will evaluate patient for admission. [MB]    Clinical Course User Index [MB] Hayden Rasmussen, MD                             Medical Decision Making Amount and/or Complexity of Data Reviewed Labs: ordered. Radiology: ordered.  Risk Prescription drug management. Decision regarding hospitalization.   This patient complains of general weakness falls head injury neck injury nausea vomiting failure to thrive; this involves an extensive number of treatment Options and is a complaint that carries with it a high risk of complications and morbidity. The differential includes dehydration, weakness, pneumonia, infection, sepsis, Sirs, obstruction  I ordered, reviewed and  interpreted labs, which included CBC with normal white count, hemoglobin low stable from priors,'s chemistries with elevated creatinine better than discharge, urinalysis without signs of infection, lactate normal, blood culture sent I ordered medication IV fluids IV nausea medication and reviewed PMP when indicated. I ordered imaging studies which included chest x-ray, CT head and cervical spine and I independently    visualized and interpreted imaging which showed possible signs of pneumonia, unclear if this is residual Additional history obtained from EMS Previous records obtained and reviewed in epic including discharge summary from recent admission I consulted [facility] hospitalist Dr. Myna Hidalgo and discussed lab and imaging findings and discussed disposition.  Cardiac monitoring reviewed, normal sinus rhythm Social determinants considered, history of depression Critical Interventions: None  After the interventions stated above, I reevaluated the patient and found patient to be hemodynamically stable.  Still remains nauseous and is retching Admission and further testing considered, she [facility] for further management of her weakness nausea vomiting.  Likely will need placement.         Final Clinical Impression(s) / ED Diagnoses Final diagnoses:  Generalized weakness  Nausea and vomiting, unspecified vomiting type    Rx / DC Orders ED Discharge Orders     None         Hayden Rasmussen, MD 01/30/23 [phone]

## 2023-01-29 NOTE — ED Notes (Signed)
Patient transported to CT 

## 2023-01-30 ENCOUNTER — Observation Stay (HOSPITAL_COMMUNITY): Payer: 59

## 2023-01-30 ENCOUNTER — Encounter (HOSPITAL_COMMUNITY): Payer: Self-pay | Admitting: Family Medicine

## 2023-01-30 DIAGNOSIS — I9589 Other hypotension: Secondary | ICD-10-CM

## 2023-01-30 DIAGNOSIS — Z6841 Body Mass Index (BMI) 40.0 and over, adult: Secondary | ICD-10-CM | POA: Diagnosis not present

## 2023-01-30 DIAGNOSIS — E782 Mixed hyperlipidemia: Secondary | ICD-10-CM | POA: Diagnosis present

## 2023-01-30 DIAGNOSIS — I959 Hypotension, unspecified: Secondary | ICD-10-CM | POA: Diagnosis not present

## 2023-01-30 DIAGNOSIS — E46 Unspecified protein-calorie malnutrition: Secondary | ICD-10-CM | POA: Diagnosis not present

## 2023-01-30 DIAGNOSIS — G8929 Other chronic pain: Secondary | ICD-10-CM | POA: Diagnosis not present

## 2023-01-30 DIAGNOSIS — F419 Anxiety disorder, unspecified: Secondary | ICD-10-CM | POA: Diagnosis present

## 2023-01-30 DIAGNOSIS — M6282 Rhabdomyolysis: Secondary | ICD-10-CM | POA: Diagnosis not present

## 2023-01-30 DIAGNOSIS — Z87891 Personal history of nicotine dependence: Secondary | ICD-10-CM | POA: Diagnosis not present

## 2023-01-30 DIAGNOSIS — J9611 Chronic respiratory failure with hypoxia: Secondary | ICD-10-CM | POA: Diagnosis not present

## 2023-01-30 DIAGNOSIS — G629 Polyneuropathy, unspecified: Secondary | ICD-10-CM | POA: Diagnosis not present

## 2023-01-30 DIAGNOSIS — G4700 Insomnia, unspecified: Secondary | ICD-10-CM | POA: Diagnosis present

## 2023-01-30 DIAGNOSIS — E861 Hypovolemia: Secondary | ICD-10-CM | POA: Diagnosis present

## 2023-01-30 DIAGNOSIS — E876 Hypokalemia: Secondary | ICD-10-CM | POA: Diagnosis present

## 2023-01-30 DIAGNOSIS — R5381 Other malaise: Secondary | ICD-10-CM | POA: Diagnosis not present

## 2023-01-30 DIAGNOSIS — N1832 Chronic kidney disease, stage 3b: Secondary | ICD-10-CM | POA: Diagnosis not present

## 2023-01-30 DIAGNOSIS — N189 Chronic kidney disease, unspecified: Secondary | ICD-10-CM | POA: Diagnosis not present

## 2023-01-30 DIAGNOSIS — M6281 Muscle weakness (generalized): Secondary | ICD-10-CM | POA: Diagnosis not present

## 2023-01-30 DIAGNOSIS — Z7401 Bed confinement status: Secondary | ICD-10-CM | POA: Diagnosis not present

## 2023-01-30 DIAGNOSIS — E039 Hypothyroidism, unspecified: Secondary | ICD-10-CM | POA: Diagnosis not present

## 2023-01-30 DIAGNOSIS — Z8249 Family history of ischemic heart disease and other diseases of the circulatory system: Secondary | ICD-10-CM | POA: Diagnosis not present

## 2023-01-30 DIAGNOSIS — Z1152 Encounter for screening for COVID-19: Secondary | ICD-10-CM | POA: Diagnosis not present

## 2023-01-30 DIAGNOSIS — R627 Adult failure to thrive: Secondary | ICD-10-CM | POA: Diagnosis not present

## 2023-01-30 DIAGNOSIS — J309 Allergic rhinitis, unspecified: Secondary | ICD-10-CM | POA: Diagnosis not present

## 2023-01-30 DIAGNOSIS — E7220 Disorder of urea cycle metabolism, unspecified: Secondary | ICD-10-CM | POA: Diagnosis present

## 2023-01-30 DIAGNOSIS — R2689 Other abnormalities of gait and mobility: Secondary | ICD-10-CM | POA: Diagnosis not present

## 2023-01-30 DIAGNOSIS — K219 Gastro-esophageal reflux disease without esophagitis: Secondary | ICD-10-CM | POA: Diagnosis not present

## 2023-01-30 DIAGNOSIS — G4733 Obstructive sleep apnea (adult) (pediatric): Secondary | ICD-10-CM | POA: Diagnosis not present

## 2023-01-30 DIAGNOSIS — R109 Unspecified abdominal pain: Secondary | ICD-10-CM | POA: Diagnosis not present

## 2023-01-30 DIAGNOSIS — F32A Depression, unspecified: Secondary | ICD-10-CM | POA: Diagnosis present

## 2023-01-30 DIAGNOSIS — R6889 Other general symptoms and signs: Secondary | ICD-10-CM | POA: Diagnosis not present

## 2023-01-30 DIAGNOSIS — I129 Hypertensive chronic kidney disease with stage 1 through stage 4 chronic kidney disease, or unspecified chronic kidney disease: Secondary | ICD-10-CM | POA: Diagnosis present

## 2023-01-30 DIAGNOSIS — Z833 Family history of diabetes mellitus: Secondary | ICD-10-CM | POA: Diagnosis not present

## 2023-01-30 DIAGNOSIS — R112 Nausea with vomiting, unspecified: Secondary | ICD-10-CM | POA: Diagnosis not present

## 2023-01-30 DIAGNOSIS — Z886 Allergy status to analgesic agent status: Secondary | ICD-10-CM | POA: Diagnosis not present

## 2023-01-30 DIAGNOSIS — J449 Chronic obstructive pulmonary disease, unspecified: Secondary | ICD-10-CM | POA: Diagnosis not present

## 2023-01-30 DIAGNOSIS — J9621 Acute and chronic respiratory failure with hypoxia: Secondary | ICD-10-CM | POA: Diagnosis not present

## 2023-01-30 LAB — CBC
HCT: 29.1 % — ABNORMAL LOW (ref 36.0–46.0)
Hemoglobin: 8.6 g/dL — ABNORMAL LOW (ref 12.0–15.0)
MCH: 28.7 pg (ref 26.0–34.0)
MCHC: 29.6 g/dL — ABNORMAL LOW (ref 30.0–36.0)
MCV: 97 fL (ref 80.0–100.0)
Platelets: 531 10*3/uL — ABNORMAL HIGH (ref 150–400)
RBC: 3 MIL/uL — ABNORMAL LOW (ref 3.87–5.11)
RDW: 15.1 % (ref 11.5–15.5)
WBC: 8 10*3/uL (ref 4.0–10.5)
nRBC: 0 % (ref 0.0–0.2)

## 2023-01-30 LAB — COMPREHENSIVE METABOLIC PANEL
ALT: 22 U/L (ref 0–44)
AST: 71 U/L — ABNORMAL HIGH (ref 15–41)
Albumin: 2.5 g/dL — ABNORMAL LOW (ref 3.5–5.0)
Alkaline Phosphatase: 76 U/L (ref 38–126)
Anion gap: 10 (ref 5–15)
BUN: 10 mg/dL (ref 6–20)
CO2: 29 mmol/L (ref 22–32)
Calcium: 8.6 mg/dL — ABNORMAL LOW (ref 8.9–10.3)
Chloride: 101 mmol/L (ref 98–111)
Creatinine, Ser: 1.59 mg/dL — ABNORMAL HIGH (ref 0.44–1.00)
GFR, Estimated: 37 mL/min — ABNORMAL LOW (ref >60)
Glucose, Bld: 79 mg/dL (ref 70–99)
Potassium: 3.6 mmol/L (ref 3.5–5.1)
Sodium: 140 mmol/L (ref 135–145)
Total Bilirubin: 0.6 mg/dL (ref 0.3–1.2)
Total Protein: 7.1 g/dL (ref 6.5–8.1)

## 2023-01-30 LAB — FOLATE: Folate: 14.1 ng/mL (ref >5.9)

## 2023-01-30 LAB — C DIFFICILE QUICK SCREEN W PCR REFLEX
C Diff antigen: NEGATIVE
C Diff interpretation: NOT DETECTED
C Diff toxin: NEGATIVE

## 2023-01-30 LAB — RESP PANEL BY RT-PCR (RSV, FLU A&B, COVID)  RVPGX2
Influenza A by PCR: NEGATIVE
Influenza B by PCR: NEGATIVE
Resp Syncytial Virus by PCR: NEGATIVE
SARS Coronavirus 2 by RT PCR: NEGATIVE

## 2023-01-30 LAB — PROCALCITONIN: Procalcitonin: <0.1 ng/mL

## 2023-01-30 LAB — CORTISOL-AM, BLOOD: Cortisol - AM: 9 ug/dL (ref 6.7–22.6)

## 2023-01-30 MED ORDER — LACTATED RINGERS IV SOLN: INTRAVENOUS | Status: DC

## 2023-01-30 MED ORDER — BOOST / RESOURCE BREEZE PO LIQD CUSTOM
1.0000 | Freq: Three times a day (TID) | ORAL | Status: DC
  Administered 2023-01-30 – 2023-02-01 (×5): 1 via ORAL

## 2023-01-30 MED ORDER — ADULT MULTIVITAMIN W/MINERALS CH
1.0000 | ORAL_TABLET | Freq: Every day | ORAL | Status: DC
  Administered 2023-01-30 – 2023-02-02 (×4): 1 via ORAL
  Filled 2023-01-30 (×4): qty 1

## 2023-01-30 MED ORDER — METOCLOPRAMIDE HCL 5 MG/ML IJ SOLN
5.0000 mg | Freq: Four times a day (QID) | INTRAMUSCULAR | Status: DC
  Administered 2023-01-30 – 2023-01-31 (×6): 5 mg via INTRAVENOUS
  Filled 2023-01-30 (×7): qty 2

## 2023-01-30 MED ORDER — ONDANSETRON HCL 4 MG/2ML IJ SOLN
4.0000 mg | Freq: Four times a day (QID) | INTRAMUSCULAR | Status: DC
  Administered 2023-01-30 – 2023-01-31 (×5): 4 mg via INTRAVENOUS
  Filled 2023-01-30 (×6): qty 2

## 2023-01-30 MED ORDER — PROSOURCE PLUS PO LIQD
30.0000 mL | Freq: Two times a day (BID) | ORAL | Status: DC
  Administered 2023-01-30 – 2023-02-02 (×7): 30 mL via ORAL
  Filled 2023-01-30 (×7): qty 30

## 2023-01-30 MED ORDER — IOHEXOL 9 MG/ML PO SOLN
ORAL | Status: AC
  Filled 2023-01-30: qty 1000

## 2023-01-30 NOTE — Progress Notes (Signed)
Initial Nutrition Assessment  DOCUMENTATION CODES:   Obesity unspecified  INTERVENTION:   -Boost Breeze po TID, each supplement provides 250 kcal and 9 grams of protein  -30 ml Prosource Plus BID, each supplement provides 100 kcals and 15 grams protein -MVI with minerals daily -RD will follow for diet advancement and adjust supplement regimen as appropriate   NUTRITION DIAGNOSIS:   Inadequate oral intake related to nausea, vomiting as evidenced by per patient/family report.  GOAL:   Patient will meet greater than or equal to 90% of their needs  MONITOR:   PO intake, Supplement acceptance, Diet advancement  REASON FOR ASSESSMENT:   Malnutrition Screening Tool    ASSESSMENT:   Pt with medical history significant for COPD, chronic hypoxic respiratory failure, hypothyroidism, CKD stage IIIb, depression, anxiety, insomnia, and chronic pain who presents for evaluation of generalized weakness, nausea, and vomiting.  Pt admitted with generalized weakness, FTT, and nausea/ vomiting.   Reviewed I/O's: +1.4 L x 24 hours  Pt unavailable at time of visit. Attempted to speak with pt via call to hospital room phone, however, unable to reach. RD unable to obtain further nutrition-related history or complete nutrition-focused physical exam at this time.    Per H&P, pt had recent hospitalization last week; she was discharged home (refused SNF placement) and has been experiencing generalized weakness at home. She is unable to ambulate without falling and with increased difficulty getting out of bed at home. Additionally, pt complains of nausea, vomiting, and dry heaves PTA and was unable to keep anything down 1 day PTA. Pt attributes this to her hiatal hernia.   Pt is currently on a clear liquid diet. No meal completion data available to assess at this time.   Reviewed wt hx; pt has experienced a 8.9% wt loss over the past month, which is significant for time frame.   Pt is at high risk  for malnutrition due to poor oral intake and significant wt loss, however, unable to identify at this time. Pt would greatly benefit from addition of oral nutrition supplements.   Medications reviewed and include reglan and lactated ringers infusion @ 75 ml/hr.   Labs reviewed.   Diet Order:   Diet Order             Diet clear liquid Room service appropriate? Yes; Fluid consistency: Thin  Diet effective now                   EDUCATION NEEDS:   Not appropriate for education at this time  Skin:  Skin Assessment: Reviewed RN Assessment  Last BM:  01/27/23 (type 6)  Height:   Ht Readings from Last 1 Encounters:  01/29/23 5' 4"$  (1.626 m)    Weight:   Wt Readings from Last 1 Encounters:  01/30/23 102.5 kg    Ideal Body Weight:  54.5 kg  BMI:  Body mass index is 38.79 kg/m.  Estimated Nutritional Needs:   Kcal:  1700-1900  Protein:  90-105 grams  Fluid:  > 1.7 L    Loistine Chance, RD, LDN, [place] Registered Dietitian II Certified Diabetes Care and Education Specialist Please refer to [facility] for RD and/or RD on-call/weekend/after hours pager

## 2023-01-30 NOTE — Hospital Course (Signed)
62 year old female with a history of COPD, chronic respiratory failure on 2 L, CKD stage III, anxiety/depression, OSA, hypertension, hyperlipidemia, and hypothyroidism presenting with progressive weakness and nausea and vomiting.  The patient was recently admitted to the hospital from 01/15/2023 to 01/20/23 with sepsis secondary to pneumonia.  The patient required vasopressors for short period of time.  She was treated with ceftriaxone and azithromycin and finished a course of antibiotics during the hospitalization.  She did have increased oxygen demand, but was weaned back to her baseline at the time of discharge. Discharge to SNF had been recommended but the patient elected to go home instead. She reports being too generally weak to ambulate without falling, and is even having difficulty getting out of bed on her own.   The patient also had a previous hospital admission from 12/02/2022 to 12/08/2022 at [facility].  She was treated for pneumonia and AKI at that time.  The patient states that she continues to have intermittent nausea and vomiting and dry heaves.  She has had some subjective fevers and chills.  She denies any chest pain, shortness of breath, worsening cough, hemoptysis, hematochezia, melena.  She does have some loose stools.  She has some abdominal discomfort primarily in the upper abdomen.  She denies any new medications since discharge.  In the ED, the patient was afebrile and hypotensive initially with blood pressure of 75/59.  She was given 1 L normal saline and Zofran.  WBC 9.3, hemoglobin 9.1, platelets 572,000.  Sodium 135, potassium 4.0, bicarbonate 30, serum creatinine 1.63.  CT of the brain was negative.  CT of the cervical spine was negative for any fracture or dislocation.  It did show an 8 mm left apical nodule.  UA was negative for pyuria.  CK was 891.  Lactic acid 1.2.  The patient was admitted for further evaluation of her nausea and vomiting and failure to thrive.  She was placed  on bowel rest, IVF,  short term reglan and zofran ATC.  She gradually improved and her diet was advanced which she tolerated.

## 2023-01-30 NOTE — Evaluation (Signed)
Physical Therapy Evaluation Patient Details Name: Krystal Burgess MRN: [id number] DOB: Jun 12, 1961 Today's Date: 01/30/2023  History of Present Illness  Krystal Burgess is a 62 y.o. female with medical history significant for COPD, chronic hypoxic respiratory failure, hypothyroidism, CKD stage IIIb, depression, anxiety, insomnia, and chronic pain who presents to the emergency department for evaluation of generalized weakness, nausea, and vomiting.      Patient was discharged from the hospital just over a week ago and notes continued and progressive generalized weakness since then.  Discharge to SNF had been recommended but the patient elected to go home instead.  She reports being too generally weak to ambulate without falling, and is even having difficulty getting out of bed on her own.  She also complains of nausea with nonbloody vomiting and dry heaves, stating that she has been unable to keep anything down today.  She attributes this to her hiatal hernia and denies any abdominal pain.  She denies cough or change in her chronic dyspnea.   Clinical Impression  Patient demonstrates slow labored movement for sitting up at bedside, unsteady on feet and limited to a few steps at bedside before having to sit due to c/o fatigue and generalized weakness.  Patient limited for sitting up in chair and requested to go back to bed due to c/o posterior neck pain.  Patient will benefit from continued skilled physical therapy in hospital and recommended venue below to increase strength, balance, endurance for safe ADLs and gait.         Recommendations for follow up therapy are one component of a multi-disciplinary discharge planning process, led by the attending physician.  Recommendations may be updated based on patient status, additional functional criteria and insurance authorization.  Follow Up Recommendations Skilled nursing-short term rehab (<3 hours/day) Can patient physically be transported by private  vehicle: Yes    Assistance Recommended at Discharge Intermittent Supervision/Assistance  Patient can return home with the following  A little help with walking and/or transfers;A little help with bathing/dressing/bathroom;Help with stairs or ramp for entrance;Assistance with cooking/housework    Equipment Recommendations None recommended by PT  Recommendations for Other Services       Functional Status Assessment Patient has had a recent decline in their functional status and demonstrates the ability to make significant improvements in function in a reasonable and predictable amount of time.     Precautions / Restrictions Precautions Precautions: Fall Restrictions Weight Bearing Restrictions: No      Mobility  Bed Mobility Overal bed mobility: Needs Assistance Bed Mobility: Supine to Sit, Sit to Supine Rolling: Supervision, Min guard     Sit to supine: Supervision, Min guard   General bed mobility comments: increased time, labored movement    Transfers Overall transfer level: Needs assistance Equipment used: Rolling walker (2 wheels) Transfers: Sit to/from Stand, Bed to chair/wheelchair/BSC Sit to Stand: Min assist, Mod assist   Step pivot transfers: Min assist, Mod assist       General transfer comment: slow labored movement    Ambulation/Gait Ambulation/Gait assistance: Min assist, Mod assist Gait Distance (Feet): 6 Feet Assistive device: Rolling walker (2 wheels) Gait Pattern/deviations: Decreased step length - right, Decreased step length - left, Decreased stride length Gait velocity: decreased     General Gait Details: limited to a few steps forward/backward before having to sit due to c/o fatigue  Stairs            Wheelchair Mobility    Modified Rankin (Stroke Patients Only)  Balance Overall balance assessment: Needs assistance Sitting-balance support: Feet supported, No upper extremity supported Sitting balance-Leahy Scale:  Fair Sitting balance - Comments: fair/good seated at EOB   Standing balance support: During functional activity, Bilateral upper extremity supported Standing balance-Leahy Scale: Fair Standing balance comment: using RW                             Pertinent Vitals/Pain Pain Assessment Pain Assessment: 0-10 Pain Score: 9  Pain Location: back of neck Pain Descriptors / Indicators: Sore, Discomfort, Grimacing Pain Intervention(s): Limited activity within patient's tolerance, Monitored during session, Repositioned    Home Living Family/patient expects to be discharged to:: Private residence Living Arrangements: Children Available Help at Discharge: Family;Available 24 hours/day Type of Home: House Home Access: Stairs to enter Entrance Stairs-Rails: Psychiatric nurse of Steps: 4 steps to to enter home; bilateral railing but can only reach one at a time; 2 steps to access her living area of the home without railings   Home Layout: One level Home Equipment: Shower seat - built in;Rollator (4 wheels);BSC/3in1;Grab bars - tub/shower;Hand held shower head      Prior Function Prior Level of Function : Needs assist       Physical Assist : Mobility (physical) Mobility (physical): Gait;Stairs;Transfers;Bed mobility   Mobility Comments: Household ambulator using RW, on 2-3 LPM home O2 ADLs Comments: assisted by family     Hand Dominance   Dominant Hand: Right    Extremity/Trunk Assessment   Upper Extremity Assessment Upper Extremity Assessment: Generalized weakness    Lower Extremity Assessment Lower Extremity Assessment: Generalized weakness    Cervical / Trunk Assessment Cervical / Trunk Assessment: Kyphotic  Communication   Communication: No difficulties  Cognition Arousal/Alertness: Awake/alert Behavior During Therapy: WFL for tasks assessed/performed Overall Cognitive Status: Within Functional Limits for tasks assessed                                           General Comments      Exercises     Assessment/Plan    PT Assessment Patient needs continued PT services  PT Problem List Decreased strength;Decreased balance;Pain;Decreased range of motion;Decreased mobility;Decreased activity tolerance;Obesity;Cardiopulmonary status limiting activity       PT Treatment Interventions Functional mobility training;Balance training;Patient/family education;Gait training;Therapeutic activities;Therapeutic exercise;Stair training;DME instruction    PT Goals (Current goals can be found in the Care Plan section)  Acute Rehab PT Goals Patient Stated Goal: return home after reahb PT Goal Formulation: With patient Time For Goal Achievement: 02/13/23 Potential to Achieve Goals: Good    Frequency Min 3X/week     Co-evaluation               AM-PAC PT "6 Clicks" Mobility  Outcome Measure Help needed turning from your back to your side while in a flat bed without using bedrails?: A Little Help needed moving from lying on your back to sitting on the side of a flat bed without using bedrails?: A Little Help needed moving to and from a bed to a chair (including a wheelchair)?: A Little Help needed standing up from a chair using your arms (e.g., wheelchair or bedside chair)?: A Little Help needed to walk in hospital room?: A Lot Help needed climbing 3-5 steps with a railing? : A Lot 6 Click Score: 16    End of Session  Equipment Utilized During Treatment: Oxygen Activity Tolerance: Patient tolerated treatment well;Patient limited by fatigue Patient left: in bed;with call bell/phone within reach Nurse Communication: Mobility status PT Visit Diagnosis: Muscle weakness (generalized) (M62.81);Unsteadiness on feet (R26.81);Difficulty in walking, not elsewhere classified (R26.2)    Time: 1520-1540 PT Time Calculation (min) (ACUTE ONLY): 20 min   Charges:   PT Evaluation $PT Eval Moderate Complexity: 1 Mod PT  Treatments $Therapeutic Activity: 8-22 mins        3:55 PM, 01/30/23 Lonell Grandchild, MPT Physical Therapist with [facility] [address] [phone] office [phone] mobile phone

## 2023-01-30 NOTE — NC FL2 (Signed)
[place] LEVEL OF CARE FORM     IDENTIFICATION  Patient Name: Krystal Burgess Birthdate: Dec 14, 1961 Sex: female Admission Date (Current Location): 01/29/2023  [facility] and [place] Number:  [employer] and Address:  [place] [address] [place]      Provider Number: [id number]  Attending Physician Name and Address:  Orson Eva, MD  Relative Name and Phone Number:       Current Level of Care: Hospital Recommended Level of Care: [place] Prior Approval Number:    Date Approved/Denied:   PASRR Number: [id number]  Discharge Plan: SNF    Current Diagnoses: Patient Active Problem List   Diagnosis Date Noted   Failure to thrive in adult 01/29/2023   Hypotension 01/29/2023   Lung nodule 01/29/2023   Intractable vomiting with nausea 01/29/2023   Sepsis due to pneumonia ([place]) 01/16/2023   Acute kidney injury superimposed on chronic kidney disease ([place]) 01/16/2023   Lactic acidosis 01/16/2023   Elevated troponin 01/16/2023   Hypoalbuminemia due to protein-calorie malnutrition ([place]) 01/16/2023   Iron deficiency 01/01/2022   COPD exacerbation ([place]) 10/09/2021   Nausea 05/28/2021   Abnormal chest CT [id number]   Folic acid deficiency Q000111Q   Acute on chronic respiratory failure with hypoxia ([place]) 03/02/2021   Elevated d-dimer 03/02/2021   Macrocytic anemia 03/02/2021   Peripheral neuropathy 03/02/2021   OSA (obstructive sleep apnea) 03/02/2021   RLS (restless legs syndrome) 01/14/2019   Sinus tachycardia 04/08/2018   Hypernatremia 04/08/2018   Chronic ethmoidal sinusitis 01/29/2018   Stage 3b chronic kidney disease (CKD) ([place]) 10/26/2017   Obesity, Class III, BMI 40-49.9 (morbid obesity) ([place]) 10/26/2017   Cutaneous candidiasis 10/24/2017   Chronic back pain 10/24/2017   Chronic respiratory failure with hypoxia ([place]) 10/23/2017   Hypomagnesemia 10/23/2017   Glaucoma 10/23/2017   COPD (chronic  obstructive pulmonary disease) ([place]) 09/09/2017   Chronic insomnia 01/30/2017   Pulmonary nodule, RML, needs noncontrast CT in February 2019 01/26/2017   Lumbar degenerative disc disease 08/14/2016   Combined forms of age-related cataract of 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   Posterior synechiae (iris), 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   Lower GI bleed 10/18/2014   Gastroesophageal reflux disease without esophagitis 10/12/2014   Diabetic peripheral neuropathy associated with type 2 diabetes mellitus ([place]) 10/12/2014   Leukocytosis 10/08/2014   History of anxiety 10/08/2014   Type 2 diabetes mellitus ([place]) 09/07/2014   Acute angle-closure glaucoma 10/03/2013   Bilateral carpal tunnel syndrome 03/14/2013   Bipolar disorder ([place]) 01/24/2013   Steroid-induced diabetes ([place]) 01/06/2013   Vitamin B 12 deficiency 06/24/2011   ALLERGIC RHINITIS 08/17/2009   UNSPECIFIED VITAMIN D DEFICIENCY 07/13/2009   Severe obesity (BMI >= 40) ([place]) 05/03/2009   Essential hypertension 03/26/2009   LEG EDEMA, BILATERAL 03/26/2009   Hypothyroidism 02/20/2009   Mixed hyperlipidemia 12/12/2008   Former smoker 12/12/2008   Depression with anxiety 12/12/2008   Mononeuritis 12/12/2008   Intrinsic asthma 12/12/2008   COPD (chronic obstructive pulmonary disease) with chronic bronchitis 12/12/2008   Spinal stenosis of lumbar region 12/12/2008    Orientation RESPIRATION BLADDER Height & Weight     Self, Time, Situation, Place  O2 (see d/c summary) Incontinent Weight: 225 lb 15.5 oz (102.5 kg) Height:  5' 4"$  (162.6 cm)  BEHAVIORAL SYMPTOMS/MOOD NEUROLOGICAL BOWEL NUTRITION STATUS      Continent    AMBULATORY STATUS COMMUNICATION OF NEEDS Skin   Extensive Assist Verbally Normal  Personal Care Assistance Level of Assistance  Bathing, Feeding, Dressing Bathing Assistance: Limited assistance Feeding assistance: Independent Dressing Assistance: Limited assistance     Functional Limitations Info  Sight,  Hearing, Speech Sight Info: Adequate Hearing Info: Adequate Speech Info: Adequate    SPECIAL CARE FACTORS FREQUENCY  PT (By licensed PT), OT (By licensed OT)     PT Frequency: 5 times weekly OT Frequency: 5 times weekly            Contractures Contractures Info: Not present    Additional Factors Info  Code Status, Allergies Code Status Info: FULL Allergies Info: Risperidone And Related, Aripiprazole, Fluoxetine Hcl, Nsaids, Paroxetine, Ziprasidone Hcl, Chantix (Varenicline Tartrate), Metformin And Related, Montelukast, Onglyza (Saxagliptin), Augmentin (Amoxicillin-pot Clavulanate), Fluoxetine Hcl           Current Medications (01/30/2023):  This is the current hospital active medication list Current Facility-Administered Medications  Medication Dose Route Frequency Provider Last Rate Last Admin   (feeding supplement) PROSource Plus liquid 30 mL  30 mL Oral BID BM Tat, David, MD   30 mL at 01/30/23 1313   acetaminophen (TYLENOL) tablet 650 mg  650 mg Oral Q6H PRN Opyd, Ilene Qua, MD       Or   acetaminophen (TYLENOL) suppository 650 mg  650 mg Rectal Q6H PRN Opyd, Ilene Qua, MD       albuterol (PROVENTIL) (2.5 MG/3ML) 0.083% nebulizer solution 2.5 mg  2.5 mg Nebulization Q4H PRN Opyd, Ilene Qua, MD   2.5 mg at 01/30/23 [id number]   ALPRAZolam Duanne Moron) tablet 0.5 mg  0.5 mg Oral BID PRN Opyd, Ilene Qua, MD   0.5 mg at 01/30/23 1216   feeding supplement (BOOST / RESOURCE BREEZE) liquid 1 Container  1 Container Oral TID BM Orson Eva, MD   1 Container at 01/30/23 1313   fluticasone furoate-vilanterol (BREO ELLIPTA) 100-25 MCG/ACT 1 puff  1 puff Inhalation Daily Opyd, Ilene Qua, MD   1 puff at 01/30/23 0814   And   umeclidinium bromide (INCRUSE ELLIPTA) 62.5 MCG/ACT 1 puff  1 puff Inhalation Daily Opyd, Ilene Qua, MD   1 puff at 01/30/23 0814   gabapentin (NEURONTIN) capsule 1,200 mg  1,200 mg Oral QHS Opyd, Ilene Qua, MD   1,200 mg at 01/29/23 2250   heparin injection 5,000 Units  5,000  Units Subcutaneous Q8H Opyd, Ilene Qua, MD   5,000 Units at 01/30/23 1312   iohexol (OMNIPAQUE) 9 MG/ML oral solution            lactated ringers infusion   Intravenous Continuous Tat, Shanon Brow, MD 75 mL/hr at 01/30/23 0818 New Bag at 01/30/23 0818   levothyroxine (SYNTHROID) tablet 88 mcg  88 mcg Oral Daily Opyd, Ilene Qua, MD   88 mcg at 01/30/23 0546   metoCLOPramide (REGLAN) injection 5 mg  5 mg Intravenous Q6H Tat, Shanon Brow, MD   5 mg at 01/30/23 1216   midodrine (PROAMATINE) tablet 10 mg  10 mg Oral TID WC Opyd, Ilene Qua, MD   10 mg at 01/30/23 1313   multivitamin with minerals tablet 1 tablet  1 tablet Oral Daily Tat, David, MD   1 tablet at 01/30/23 1017   ondansetron (ZOFRAN) tablet 4 mg  4 mg Oral Q6H PRN Opyd, Ilene Qua, MD       Or   ondansetron (ZOFRAN) injection 4 mg  4 mg Intravenous Q6H PRN Opyd, Ilene Qua, MD   4 mg at 01/30/23 0545   ondansetron (ZOFRAN) injection 4 mg  4 mg Intravenous Q6H Tat, Shanon Brow, MD   4 mg at 01/30/23 1216   oxyCODONE (Oxy IR/ROXICODONE) immediate release tablet 5 mg  5 mg Oral Q8H PRN Opyd, Ilene Qua, MD   5 mg at 01/30/23 0809   pantoprazole (PROTONIX) EC tablet 40 mg  40 mg Oral Daily Opyd, Ilene Qua, MD   40 mg at 01/30/23 0809   senna-docusate (Senokot-S) tablet 1 tablet  1 tablet Oral QHS PRN Opyd, Ilene Qua, MD       sertraline (ZOLOFT) tablet 200 mg  200 mg Oral Daily Opyd, Ilene Qua, MD   200 mg at 01/30/23 0809   sodium chloride flush (NS) 0.9 % injection 3 mL  3 mL Intravenous Q12H Opyd, Ilene Qua, MD   3 mL at 01/30/23 [id number]   tiZANidine (ZANAFLEX) tablet 4 mg  4 mg Oral QHS PRN Opyd, Ilene Qua, MD       traZODone (DESYREL) tablet 100 mg  100 mg Oral QHS PRN Opyd, Ilene Qua, MD   100 mg at 01/30/23 0006     Discharge Medications: Please see discharge summary for a list of discharge medications.  Relevant Imaging Results:  Relevant Lab Results:   Additional Information SSN: [id number] [place]

## 2023-01-30 NOTE — Progress Notes (Signed)
PROGRESS NOTE  Krystal Burgess [id number] DOB: 01-27-1961 DOA: 01/29/2023 PCP: Hali Marry, MD  Brief History:  62 year old female with a history of COPD, chronic respiratory failure on 2 L, CKD stage III, anxiety/depression, OSA, hypertension, hyperlipidemia, and hypothyroidism presenting with progressive weakness and nausea and vomiting.  The patient was recently admitted to the hospital from 01/15/2023 to 01/20/23 with sepsis secondary to pneumonia.  The patient required vasopressors for short period of time.  She was treated with ceftriaxone and azithromycin and finished a course of antibiotics during the hospitalization.  She did have increased oxygen demand, but was weaned back to her baseline at the time of discharge. Discharge to SNF had been recommended but the patient elected to go home instead. She reports being too generally weak to ambulate without falling, and is even having difficulty getting out of bed on her own.   The patient also had a previous hospital admission from 12/02/2022 to 12/08/2022 at [facility].  She was treated for pneumonia and AKI at that time.  The patient states that she continues to have intermittent nausea and vomiting and dry heaves.  She has had some subjective fevers and chills.  She denies any chest pain, shortness of breath, worsening cough, hemoptysis, hematochezia, melena.  She does have some loose stools.  She has some abdominal discomfort primarily in the upper abdomen.  She denies any new medications since discharge.  In the ED, the patient was afebrile and hypotensive initially with blood pressure of 75/59.  She was given 1 L normal saline and Zofran.  WBC 9.3, hemoglobin 9.1, platelets 572,000.  Sodium 135, potassium 4.0, bicarbonate 30, serum creatinine 1.63.  CT of the brain was negative.  CT of the cervical spine was negative for any fracture or dislocation.  It did show an 8 mm left apical nodule.  UA was negative for pyuria.  CK was  891.  Lactic acid 1.2.  The patient was admitted for further evaluation of her nausea and vomiting and failure to thrive.   Assessment/Plan: Intractable nausea and vomiting -Start around-the-clock Zofran -Start metoclopramide -Continue IV fluids -CT abdomen and pelvis -Start pantoprazole twice daily -UA negative for pyuria -Clear liquid diet for now -Patient currently cannot tolerate solid food -lipase 36  Failure to thrive/generalized weakness -UA negative for pyuria -01/18/2023 TSH 7.334, free T4--0.93 -Serum [id number] -Folic acid -PT evaluation  Chronic respiratory failure with hypoxia/COPD -Chronically on 2 L at rest, 4 L with activity -Continue DuoNebs -Patient has nearly 80-pack-year history -Quit smoking 2 years prior to this admission -Continue Breo and Incruse  Pulmonary infiltrate -Suspect this is residual infiltrate from previously treated pneumonia -Check PCT -COVID-19 PCR  CKD stage IIIb -Patient has had wide variations in her renal function -Appears that baseline is near 1.4-1.6 -Serial BMPs  Hypotension -Lactic acid 1.2 -Continue IV fluids -A.m. cortisol -Antihypertensive medications discontinued last month  Hyperammonemia -Clinical significance unclear as the patient is alert and oriented x 3 -Presented with ammonia 53 -Monitor clinically  Hypothyroidism -Continue Synthroid -Thyroid function studies as above  Depression/anxiety -Continue Zoloft  Mixed hyperlipidemia -Hold Crestor temporarily due to elevated LFTs and elevated CK  Lung nodule -Incidental finding on CT cervical spine -Outpatient follow-up  Chronic low back pain -PDMP reviewed--no controlled substances prescribed or filled in the last 2 years  Morbid obesity -BMI 42.12 -Lifestyle modification  Loose stool -Check C. Difficile -Check stool pathogen panel  Family Communication:  no Family at bedside  Consultants:  none  Code Status:  FULL   DVT  Prophylaxis:  B[place]elle Heparin   Procedures: As Listed in Progress Note Above  Antibiotics: None      Subjective: Patient continues to have nausea and vomiting and dry heaving.  She has some upper abdominal pain.  She denies any chest pain, shortness of breath.  She has a nonproductive cough.  She denies any headache, neck pain, hematochezia, melena.  She has some loose stool.  Objective: Vitals:   01/29/23 2200 01/29/23 2202 01/29/23 2221 01/30/23 0434  BP: 102/84  (!) 114/57 112/61  Pulse: 62  (!) 58 70  Resp: 18   17  Temp:  98.4 F (36.9 C) 98 F (36.7 C) 98.8 F (37.1 C)  TempSrc:  Rectal  Oral  SpO2: 97%  98% 98%  Weight:    102.5 kg  Height:        Intake/Output Summary (Last 24 hours) at 01/30/2023 0736 Last data filed at 01/30/2023 0457 Gross per 24 hour  Intake 1410 ml  Output --  Net 1410 ml   Weight change:  Exam:  General:  Pt is alert, follows commands appropriately, not in acute distress HEENT: No icterus, No thrush, No neck mass, [place]/AT Cardiovascular: RRR, S1/S2, no rubs, no gallops Respiratory: CTA bilaterally, no wheezing, no crackles, no rhonchi Abdomen: Soft/+BS, non tender, non distended, no guarding Extremities: No edema, No lymphangitis, No petechiae, No rashes, no synovitis   Data Reviewed: I have personally reviewed following labs and imaging studies Basic Metabolic Panel: Recent Labs  Lab 01/29/23 2018  NA 135  K 4.0  CL 94*  CO2 30  GLUCOSE 96  BUN 10  CREATININE 1.63*  CALCIUM 8.5*  MG 1.8   Liver Function Tests: Recent Labs  Lab 01/29/23 2018  AST 78*  ALT 25  ALKPHOS 82  BILITOT 0.7  PROT 7.5  ALBUMIN 2.6*   Recent Labs  Lab 01/29/23 2018  LIPASE 36   Recent Labs  Lab 01/29/23 2018  AMMONIA 53*   Coagulation Profile: No results for input(s): "INR", "PROTIME" in the last 168 hours. CBC: Recent Labs  Lab 01/29/23 2018 01/30/23 0458  WBC 9.3 8.0  NEUTROABS 5.5  --   HGB 9.1* 8.6*  HCT 30.9* 29.1*  MCV  96.9 97.0  PLT 572* 531*   Cardiac Enzymes: Recent Labs  Lab 01/29/23 2018  CKTOTAL 891*   BNP: Invalid input(s): "POCBNP" CBG: No results for input(s): "GLUCAP" in the last 168 hours. HbA1C: No results for input(s): "HGBA1C" in the last 72 hours. Urine analysis:    Component Value Date/Time   COLORURINE YELLOW 01/29/2023 2201   APPEARANCEUR CLEAR 01/29/2023 2201   LABSPEC 1.008 01/29/2023 2201   PHURINE 6.0 01/29/2023 2201   GLUCOSEU NEGATIVE 01/29/2023 2201   HGBUR SMALL (A) 01/29/2023 2201   HGBUR negative 05/03/2009 1012   BILIRUBINUR NEGATIVE 01/29/2023 2201   BILIRUBINUR neg 01/15/2015 1433   KETONESUR NEGATIVE 01/29/2023 2201   PROTEINUR NEGATIVE 01/29/2023 2201   UROBILINOGEN 0.2 01/15/2015 1433   UROBILINOGEN 0.2 05/03/2009 1012   NITRITE NEGATIVE 01/29/2023 2201   LEUKOCYTESUR NEGATIVE 01/29/2023 2201   Sepsis Labs: @LABRCNTIP$ (procalcitonin:4,lacticidven:4) ) Recent Results (from the past 240 hour(s))  Culture, blood (routine x 2)     Status: None (Preliminary result)   Collection Time: 01/29/23  9:08 PM   Specimen: BLOOD  Result Value Ref Range Status   Specimen Description BLOOD RIGHT ANTECUBITAL  Final   Special Requests   Final    BOTTLES DRAWN AEROBIC AND ANAEROBIC Blood Culture adequate volume Performed at [facility], [address], [place], [place] [address]    Culture PENDING  Incomplete   Report Status PENDING  Incomplete  Culture, blood (routine x 2)     Status: None (Preliminary result)   Collection Time: 01/29/23  9:08 PM   Specimen: BLOOD  Result Value Ref Range Status   Specimen Description BLOOD BLOOD RIGHT HAND  Final   Special Requests   Final    BOTTLES DRAWN AEROBIC AND ANAEROBIC Blood Culture results may not be optimal due to an inadequate volume of blood received in culture bottles Performed at [facility], [address], [place], [place] [address]    Culture PENDING  Incomplete   Report Status PENDING  Incomplete      Scheduled Meds:  fluticasone furoate-vilanterol  1 puff Inhalation Daily   And   umeclidinium bromide  1 puff Inhalation Daily   gabapentin  1,200 mg Oral QHS   heparin  5,000 Units Subcutaneous Q8H   levothyroxine  88 mcg Oral Daily   metoCLOPramide (REGLAN) injection  5 mg Intravenous Q6H   midodrine  10 mg Oral TID WC   ondansetron (ZOFRAN) IV  4 mg Intravenous Q6H   pantoprazole  40 mg Oral Daily   rosuvastatin  20 mg Oral Daily   sertraline  200 mg Oral Daily   sodium chloride flush  3 mL Intravenous Q12H   Continuous Infusions:  Procedures/Studies: DG Abd 1 View  Result Date: 01/29/2023 CLINICAL DATA:  Nausea and vomiting EXAM: ABDOMEN - 1 VIEW COMPARISON:  None Available. FINDINGS: 2 supine frontal views of the abdomen and pelvis are obtained. No bowel obstruction or ileus. No masses or abnormal calcifications. No acute bony abnormality. Patchy consolidation at the left lung base. IMPRESSION: 1. Unremarkable bowel gas pattern. 2. Patchy left basilar consolidation consistent with pneumonia. Electronically Signed   By: Randa Ngo M.D.   On: 01/29/2023 23:15   CT Head Wo Contrast  Result Date: 01/29/2023 CLINICAL DATA:  Fall with head and neck trauma. EXAM: CT HEAD WITHOUT CONTRAST CT CERVICAL SPINE WITHOUT CONTRAST TECHNIQUE: Multidetector CT imaging of the head and cervical spine was performed following the standard protocol without intravenous contrast. Multiplanar CT image reconstructions of the cervical spine were also generated. RADIATION DOSE REDUCTION: This exam was performed according to the departmental dose-optimization program which includes automated exposure control, adjustment of the mA and/or kV according to patient size and/or use of iterative reconstruction technique. COMPARISON:  CT head 03/02/2021. No prior cervical spine films or cross-sectional imaging. FINDINGS: CT HEAD FINDINGS Brain: No evidence of acute infarction, hemorrhage, hydrocephalus, extra-axial  collection or mass lesion/mass effect. There is mild cerebral atrophy and early small-vessel disease in the cerebral white matter. Cerebellum and brainstem are unremarkable. Vascular: No hyperdense vessel or unexpected calcification. Skull: Negative for fractures or focal lesions. No scalp hematoma is seen. Sinuses/Orbits: No acute orbital findings. Old lens extractions. Nasal septum deviates broadly to the right. There is mild membrane thickening in the left maxillary sinus. Other visualized sinuses, left mastoid air cells are clear. There is chronic fluid in the right mastoid tip. Other: None. CT CERVICAL SPINE FINDINGS Technical note: Very limited image detail below the level of C4. This is due to a combination of patient motion artifact, superimposition of the patient's shoulders causing beam hardening and loss of image detail, and patient habitus. Alignment: Slightly  reversed cervical lordosis, minimal levoscoliosis. There is slight anterolisthesis at C5-6 which probably due to discogenic degenerative arthrosis. No traumatic listhesis is suspected. No other listhesis is seen. Narrowing and spurring noted anterior atlantodental joint. 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 As above very limited study for fracture detection below C4. No obvious compression fracture is seen or displaced fractures. No primary pathologic process is grossly suspected. Soft tissues and spinal canal: Spinal canal contents obscured due to beam hardening and motion below C4. Above C4-5, there is no visible canal hematoma. There is no precervical soft tissue swelling. There is marked fatty infiltration of the parotid glands. There are calcifications in the left-greater-than-right proximal cervical ICAs. No laryngeal or thyroid mass is seen. Disc levels: Not well evaluated. There is moderate disc space loss at C5-6 and C7-T1, otherwise gross preservation of the normal disc heights.  There is mild spondylosis. Possible there could be spinal stenosis due to posterior disc osteophyte complexes at C5-6, C6-7 and C7-T1 but this is speculative at best given the degree of obscuration of the spinal canal contents. There are mild facet spurring changes but no significant bony foraminal encroachment. Upper chest: Ill-defined airspace consolidation medial left lung apex. Separate from this there is an 8 mm left apical nodule on 4:82, not seen on the CTA chest from 03/02/2021. Right lung apex is clear. Other: None. IMPRESSION: 1. No acute intracranial CT findings or depressed skull fractures. 2. Mild atrophy and early small-vessel disease. 3. Very limited cervical spine CT due to patient motion, superimposition of the patient's shoulders, and beam hardening. No obvious compression fractures or displaced fractures are seen. 4. 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 Slight cervical kypholevoscoliosis. 5. 8 mm left apical nodule, not seen on the CTA chest from 03/02/2021. Possible this could be an inflammatory nodule given the finding of adjacent airspace disease, but nonspecific. 6. Non-contrast chest CT at 6-12 months is recommended. If the nodule is stable at time of repeat CT, then future CT at 18-24 months (from today's scan) is considered optional for low-risk patients, but is recommended for high-risk patients. This recommendation follows the consensus statement: Guidelines for Management of Incidental Pulmonary Nodules Detected on CT Images: From the Fleischner Society 2017; Radiology 2017; 284:228-243. 7. Ill-defined airspace consolidation medial left lung apex. Correlate clinically for pneumonia. Follow-up as indicated. 8. Vascular calcifications left-greater-than-right proximal cervical ICAs. Electronically Signed   By: Telford Nab M.D.   On: 01/29/2023 20:59   CT Cervical Spine Wo Contrast  Result Date:  01/29/2023 CLINICAL DATA:  Fall with head and neck trauma. EXAM: CT HEAD WITHOUT CONTRAST CT CERVICAL SPINE WITHOUT CONTRAST TECHNIQUE: Multidetector CT imaging of the head and cervical spine was performed following the standard protocol without intravenous contrast. Multiplanar CT image reconstructions of the cervical spine were also generated. RADIATION DOSE REDUCTION: This exam was performed according to the departmental dose-optimization program which includes automated exposure control, adjustment of the mA and/or kV according to patient size and/or use of iterative reconstruction technique. COMPARISON:  CT head 03/02/2021. No prior cervical spine films or cross-sectional imaging. FINDINGS: CT HEAD FINDINGS Brain: No evidence of acute infarction, hemorrhage, hydrocephalus, extra-axial collection or mass lesion/mass effect. There is mild cerebral atrophy and early small-vessel disease in the cerebral white matter. Cerebellum and brainstem are unremarkable. Vascular: No hyperdense vessel or unexpected calcification. Skull: Negative for fractures or focal lesions. No scalp hematoma  is seen. Sinuses/Orbits: No acute orbital findings. Old lens extractions. Nasal septum deviates broadly to the right. There is mild membrane thickening in the left maxillary sinus. Other visualized sinuses, left mastoid air cells are clear. There is chronic fluid in the right mastoid tip. Other: None. CT CERVICAL SPINE FINDINGS Technical note: Very limited image detail below the level of C4. This is due to a combination of patient motion artifact, superimposition of the patient's shoulders causing beam hardening and loss of image detail, and patient habitus. Alignment: Slightly reversed cervical lordosis, minimal levoscoliosis. There is slight anterolisthesis at C5-6 which probably due to discogenic degenerative arthrosis. No traumatic listhesis is suspected. No other listhesis is seen. Narrowing and spurring noted anterior atlantodental  joint. 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 As above very limited study for fracture detection below C4. No obvious compression fracture is seen or displaced fractures. No primary pathologic process is grossly suspected. Soft tissues and spinal canal: Spinal canal contents obscured due to beam hardening and motion below C4. Above C4-5, there is no visible canal hematoma. There is no precervical soft tissue swelling. There is marked fatty infiltration of the parotid glands. There are calcifications in the left-greater-than-right proximal cervical ICAs. No laryngeal or thyroid mass is seen. Disc levels: Not well evaluated. There is moderate disc space loss at C5-6 and C7-T1, otherwise gross preservation of the normal disc heights. There is mild spondylosis. Possible there could be spinal stenosis due to posterior disc osteophyte complexes at C5-6, C6-7 and C7-T1 but this is speculative at best given the degree of obscuration of the spinal canal contents. There are mild facet spurring changes but no significant bony foraminal encroachment. Upper chest: Ill-defined airspace consolidation medial left lung apex. Separate from this there is an 8 mm left apical nodule on 4:82, not seen on the CTA chest from 03/02/2021. Right lung apex is clear. Other: None. IMPRESSION: 1. No acute intracranial CT findings or depressed skull fractures. 2. Mild atrophy and early small-vessel disease. 3. Very limited cervical spine CT due to patient motion, superimposition of the patient's shoulders, and beam hardening. No obvious compression fractures or displaced fractures are seen. 4. 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 Slight cervical kypholevoscoliosis. 5. 8 mm left apical nodule, not seen on the CTA chest from 03/02/2021. Possible this could be an inflammatory nodule given the finding of adjacent  airspace disease, but nonspecific. 6. Non-contrast chest CT at 6-12 months is recommended. If the nodule is stable at time of repeat CT, then future CT at 18-24 months (from today's scan) is considered optional for low-risk patients, but is recommended for high-risk patients. This recommendation follows the consensus statement: Guidelines for Management of Incidental Pulmonary Nodules Detected on CT Images: From the Fleischner Society 2017; Radiology 2017; 284:228-243. 7. Ill-defined airspace consolidation medial left lung apex. Correlate clinically for pneumonia. Follow-up as indicated. 8. Vascular calcifications left-greater-than-right proximal cervical ICAs. Electronically Signed   By: Telford Nab M.D.   On: 01/29/2023 20:59   DG Chest Port 1 View  Result Date: 01/29/2023 CLINICAL DATA:  Weakness and shortness of breath. EXAM: PORTABLE CHEST 1 VIEW COMPARISON:  Chest radiograph dated 01/15/2023. FINDINGS: There is mild diffuse chronic interstitial coarsening. Faint area of increased density in the subpleural right upper lobe, may represent chronic changes and sequela prior inflammation/infection. However, this has slightly progressed since the prior radiograph and concerning for recurrent pneumonia.  Additional scattered smaller nodular densities improved since the prior radiograph. No pleural effusion or pneumothorax. The cardiac silhouette is within normal limits. No acute osseous pathology. IMPRESSION: Persistent opacity in the subpleural right upper lobe. Additional scattered nodular densities improved since the prior radiograph. Continued follow-up recommended. Electronically Signed   By: Anner Crete M.D.   On: 01/29/2023 20:42   DG Chest Port 1 View  Result Date: 01/15/2023 CLINICAL DATA:  Questionable sepsis. EXAM: PORTABLE CHEST 1 VIEW COMPARISON:  Chest radiograph and CT dated 03/02/2021. FINDINGS: Scatter pulmonary nodules as seen on the prior CT most consistent with multi lobar pneumonia,  possibly atypical in etiology. Clinical correlation and follow-up to resolution recommended. There is slight blunting of the left costophrenic angle which may represent scarring or trace pleural effusion. No pneumothorax. The cardiac silhouette is within normal limits. No acute osseous pathology. IMPRESSION: Multi lobar pneumonia. Clinical correlation and follow-up to resolution recommended. Electronically Signed   By: Anner Crete M.D.   On: 01/15/2023 20:30    Orson Eva, DO  [facility]  If 7PM-7AM, please contact night-coverage [email or web address] Password [facility] 01/30/2023, 7:36 AM   LOS: 0 days

## 2023-01-30 NOTE — TOC Initial Note (Addendum)
Transition of Care [facility]) - Initial/Assessment Note    Patient Details  Name: Krystal Burgess MRN: [id number] Date of Birth: [birth year]  Transition of Care [facility]) CM/SW Contact:    Iona Beard, [place] Phone Number: 01/30/2023, 2:09 PM  Clinical Narrative:                 [facility] consulted for SNF placement. CSW spoke with pt about consult. Pt states that she is agreeable to SNF this time. CSW spoke with pts daughter as well who states preference is SNF at [facility]. CSW confirms this is where pt would like to go for SNF as well. CSW completed referral and sent out to [facility] for review. CSW awaiting PT notes before starting insurance auth. TOC to follow.   [facility] offered bed. CSW started insurance auth. PT notes will need to be added when PT has seen pt and made recommendation.   Expected Discharge Plan: Skilled Nursing Facility Barriers to Discharge: Continued Medical Work up   Patient Goals and CMS Choice Patient states their goals for this hospitalization and ongoing recovery are:: go to SNF CMS Medicare.gov Compare Post Acute Care list provided to:: Patient Choice offered to / list presented to : Patient, Adult Children      Expected Discharge Plan and Services In-house Referral: Clinical Social Work Discharge Planning Services: CM Consult Post Acute Care Choice: [place] Living arrangements for the past 2 months: [place]                                      Prior Living Arrangements/Services Living arrangements for the past 2 months: Single Family Home Lives with:: Adult Children Patient language and need for interpreter reviewed:: Yes Do you feel safe going back to the place where you live?: Yes      Need for Family Participation in Patient Care: Yes (Comment) Care giver support system in place?: Yes (comment) Current home services: DME Criminal Activity/Legal Involvement Pertinent to Current Situation/Hospitalization: No - Comment  as needed  Activities of Daily Living Home Assistive Devices/Equipment: Walker (specify type), Scales, Oxygen, Nebulizer, Grab bars in shower, Grab bars around toilet, Eyeglasses, Bedside commode/3-in-1, Hand-held shower hose, Raised toilet seat with rails, Shower chair with back ADL Screening (condition at time of admission) Patient's cognitive ability adequate to safely complete daily activities?: Yes Is the patient deaf or have difficulty hearing?: No Does the patient have difficulty seeing, even when wearing glasses/contacts?: No Does the patient have difficulty concentrating, remembering, or making decisions?: No Patient able to express need for assistance with ADLs?: Yes Does the patient have difficulty dressing or bathing?: Yes Independently performs ADLs?: No Communication: Independent Dressing (OT): Needs assistance Is this a change from baseline?: Change from baseline, expected to last <3days Grooming: Needs assistance Is this a change from baseline?: Change from baseline, expected to last <3 days Feeding: Independent Bathing: Needs assistance Is this a change from baseline?: Pre-admission baseline Toileting: Needs assistance Is this a change from baseline?: Change from baseline, expected to last <3 days In/Out Bed: Needs assistance Is this a change from baseline?: Change from baseline, expected to last <3 days Walks in Home: Needs assistance Is this a change from baseline?: Change from baseline, expected to last <3 days Does the patient have difficulty walking or climbing stairs?: Yes Weakness of Legs: Both Weakness of Arms/Hands: None  Permission Sought/Granted  Emotional Assessment Appearance:: Appears stated age Attitude/Demeanor/Rapport: Engaged Affect (typically observed): Accepting Orientation: : Oriented to Self, Oriented to  Time, Oriented to Place, Oriented to Situation Alcohol / Substance Use: Not Applicable Psych Involvement: No  (comment)  Admission diagnosis:  Failure to thrive in adult [R62.7] Generalized weakness [R53.1] Patient Active Problem List   Diagnosis Date Noted   Failure to thrive in adult 01/29/2023   Hypotension 01/29/2023   Lung nodule 01/29/2023   Intractable vomiting with nausea 01/29/2023   Sepsis due to pneumonia ([place]) 01/16/2023   Acute kidney injury superimposed on chronic kidney disease ([place]) 01/16/2023   Lactic acidosis 01/16/2023   Elevated troponin 01/16/2023   Hypoalbuminemia due to protein-calorie malnutrition ([place]) 01/16/2023   Iron deficiency 01/01/2022   COPD exacerbation ([place]) 10/09/2021   Nausea 05/28/2021   Abnormal chest CT [id number]   Folic acid deficiency Q000111Q   Acute on chronic respiratory failure with hypoxia (HCC) 03/02/2021   Elevated d-dimer 03/02/2021   Macrocytic anemia 03/02/2021   Peripheral neuropathy 03/02/2021   OSA (obstructive sleep apnea) 03/02/2021   RLS (restless legs syndrome) 01/14/2019   Sinus tachycardia 04/08/2018   Hypernatremia 04/08/2018   Chronic ethmoidal sinusitis 01/29/2018   Stage 3b chronic kidney disease (CKD) ([place]) 10/26/2017   Obesity, Class III, BMI 40-49.9 (morbid obesity) ([place]) 10/26/2017   Cutaneous candidiasis 10/24/2017   Chronic back pain 10/24/2017   Chronic respiratory failure with hypoxia ([place]) 10/23/2017   Hypomagnesemia 10/23/2017   Glaucoma 10/23/2017   COPD (chronic obstructive pulmonary disease) ([place]) 09/09/2017   Chronic insomnia 01/30/2017   Pulmonary nodule, RML, needs noncontrast CT in February 2019 01/26/2017   Lumbar degenerative disc disease 08/14/2016   Combined forms of age-related cataract of 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   Posterior synechiae (iris), 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   Lower GI bleed 10/18/2014   Gastroesophageal reflux disease without esophagitis 10/12/2014   Diabetic peripheral neuropathy associated with type 2 diabetes mellitus ([place]) 10/12/2014   Leukocytosis 10/08/2014   History of anxiety  10/08/2014   Type 2 diabetes mellitus ([place]) 09/07/2014   Acute angle-closure glaucoma 10/03/2013   Bilateral carpal tunnel syndrome 03/14/2013   Bipolar disorder ([place]) 01/24/2013   Steroid-induced diabetes ([place]) 01/06/2013   Vitamin B 12 deficiency 06/24/2011   ALLERGIC RHINITIS 08/17/2009   UNSPECIFIED VITAMIN D DEFICIENCY 07/13/2009   Severe obesity (BMI >= 40) ([place]) 05/03/2009   Essential hypertension 03/26/2009   LEG EDEMA, BILATERAL 03/26/2009   Hypothyroidism 02/20/2009   Mixed hyperlipidemia 12/12/2008   Former smoker 12/12/2008   Depression with anxiety 12/12/2008   Mononeuritis 12/12/2008   Intrinsic asthma 12/12/2008   COPD (chronic obstructive pulmonary disease) with chronic bronchitis 12/12/2008   Spinal stenosis of lumbar region 12/12/2008   PCP:  Hali Marry, MD Pharmacy:   [id number] [id number]: [phone]: [phone]    Social Determinants of Health (SDOH) Social History: SDOH Screenings   Food Insecurity: No Food Insecurity (01/30/2023)  Housing: Low Risk  (01/30/2023)  Transportation Needs: No Transportation Needs (01/30/2023)  Utilities: Not At Risk (01/30/2023)  Depression (PHQ2-9): High Risk (12/16/2022)  Tobacco Use: Medium Risk (01/30/2023)   SDOH Interventions:     Readmission Risk Interventions    01/17/2023    3:21 PM 01/16/2023   10:07 AM  Readmission Risk Prevention Plan  Transportation Screening Complete Complete  HRI or Home Care Consult  Complete  Social Work [place] for [place]  Complete  Palliative Care Screening  Not Applicable  Medication Review (RN Care Manager)  Complete

## 2023-01-30 NOTE — Plan of Care (Signed)
  Problem: Acute Rehab PT Goals(only PT should resolve) Goal: Pt Will Go Supine/Side To Sit Outcome: Progressing Flowsheets (Taken 01/30/2023 1555) Pt will go Supine/Side to Sit:  with supervision  with modified independence Goal: Patient Will Transfer Sit To/From Stand Outcome: Progressing Flowsheets (Taken 01/30/2023 1555) Patient will transfer sit to/from stand:  with supervision  with modified independence Goal: Pt Will Transfer Bed To Chair/Chair To Bed Outcome: Progressing Flowsheets (Taken 01/30/2023 1555) Pt will Transfer Bed to Chair/Chair to Bed: min guard assist Goal: Pt Will Ambulate Outcome: Progressing Flowsheets (Taken 01/30/2023 1555) Pt will Ambulate:  25 feet  with min guard assist  with minimal assist  with rolling walker   3:56 PM, 01/30/23 Lonell Grandchild, MPT Physical Therapist with [facility] [phone] office [phone] mobile phone

## 2023-01-31 DIAGNOSIS — J9611 Chronic respiratory failure with hypoxia: Secondary | ICD-10-CM | POA: Diagnosis not present

## 2023-01-31 DIAGNOSIS — R112 Nausea with vomiting, unspecified: Secondary | ICD-10-CM | POA: Diagnosis not present

## 2023-01-31 DIAGNOSIS — N1832 Chronic kidney disease, stage 3b: Secondary | ICD-10-CM | POA: Diagnosis not present

## 2023-01-31 DIAGNOSIS — R627 Adult failure to thrive: Secondary | ICD-10-CM | POA: Diagnosis not present

## 2023-01-31 LAB — GASTROINTESTINAL PANEL BY PCR, STOOL (REPLACES STOOL CULTURE)

## 2023-01-31 LAB — COMPREHENSIVE METABOLIC PANEL
ALT: 21 U/L (ref 0–44)
AST: 66 U/L — ABNORMAL HIGH (ref 15–41)
Albumin: 2.8 g/dL — ABNORMAL LOW (ref 3.5–5.0)
Alkaline Phosphatase: 79 U/L (ref 38–126)
Anion gap: 11 (ref 5–15)
BUN: 10 mg/dL (ref 6–20)
CO2: 29 mmol/L (ref 22–32)
Calcium: 8.7 mg/dL — ABNORMAL LOW (ref 8.9–10.3)
Chloride: 100 mmol/L (ref 98–111)
Creatinine, Ser: 1.5 mg/dL — ABNORMAL HIGH (ref 0.44–1.00)
GFR, Estimated: 40 mL/min — ABNORMAL LOW (ref >60)
Glucose, Bld: 82 mg/dL (ref 70–99)
Potassium: 3.1 mmol/L — ABNORMAL LOW (ref 3.5–5.1)
Sodium: 140 mmol/L (ref 135–145)
Total Bilirubin: 0.5 mg/dL (ref 0.3–1.2)
Total Protein: 7.7 g/dL (ref 6.5–8.1)

## 2023-01-31 LAB — AMMONIA: Ammonia: 32 umol/L (ref 9–35)

## 2023-01-31 LAB — CBC
HCT: 30.4 % — ABNORMAL LOW (ref 36.0–46.0)
Hemoglobin: 9.1 g/dL — ABNORMAL LOW (ref 12.0–15.0)
MCH: 29 pg (ref 26.0–34.0)
MCHC: 29.9 g/dL — ABNORMAL LOW (ref 30.0–36.0)
MCV: 96.8 fL (ref 80.0–100.0)
Platelets: 518 10*3/uL — ABNORMAL HIGH (ref 150–400)
RBC: 3.14 MIL/uL — ABNORMAL LOW (ref 3.87–5.11)
RDW: 15.4 % (ref 11.5–15.5)
WBC: 7.6 10*3/uL (ref 4.0–10.5)
nRBC: 0 % (ref 0.0–0.2)

## 2023-01-31 LAB — CK: Total CK: 466 U/L — ABNORMAL HIGH (ref 38–234)

## 2023-01-31 MED ORDER — POTASSIUM CHLORIDE 20 MEQ PO PACK
40.0000 meq | PACK | Freq: Once | ORAL | Status: AC
  Administered 2023-01-31: 40 meq via ORAL
  Filled 2023-01-31: qty 2

## 2023-01-31 NOTE — Progress Notes (Signed)
PROGRESS NOTE  Krystal Burgess [id number] DOB: 1961/07/10 DOA: 01/29/2023 PCP: Hali Marry, MD  Brief History:  62 year old female with a history of COPD, chronic respiratory failure on 2 L, CKD stage III, anxiety/depression, OSA, hypertension, hyperlipidemia, and hypothyroidism presenting with progressive weakness and nausea and vomiting.  The patient was recently admitted to the hospital from 01/15/2023 to 01/20/23 with sepsis secondary to pneumonia.  The patient required vasopressors for short period of time.  She was treated with ceftriaxone and azithromycin and finished a course of antibiotics during the hospitalization.  She did have increased oxygen demand, but was weaned back to her baseline at the time of discharge. Discharge to SNF had been recommended but the patient elected to go home instead. She reports being too generally weak to ambulate without falling, and is even having difficulty getting out of bed on her own.   The patient also had a previous hospital admission from 12/02/2022 to 12/08/2022 at [facility].  She was treated for pneumonia and AKI at that time.  The patient states that she continues to have intermittent nausea and vomiting and dry heaves.  She has had some subjective fevers and chills.  She denies any chest pain, shortness of breath, worsening cough, hemoptysis, hematochezia, melena.  She does have some loose stools.  She has some abdominal discomfort primarily in the upper abdomen.  She denies any new medications since discharge.  In the ED, the patient was afebrile and hypotensive initially with blood pressure of 75/59.  She was given 1 L normal saline and Zofran.  WBC 9.3, hemoglobin 9.1, platelets 572,000.  Sodium 135, potassium 4.0, bicarbonate 30, serum creatinine 1.63.  CT of the brain was negative.  CT of the cervical spine was negative for any fracture or dislocation.  It did show an 8 mm left apical nodule.  UA was negative for pyuria.  CK was  891.  Lactic acid 1.2.  The patient was admitted for further evaluation of her nausea and vomiting and failure to thrive.   Assessment/Plan:  Intractable nausea and vomiting -continue around-the-clock Zofran -Continue metoclopramide -Continue IV fluids -CT abdomen and pelvis--patchy consolidation, centrilobular nodularity and interlobular septal thickening LLL>RLL; no acute intra-abd findings -continue pantoprazole twice daily -UA negative for pyuria -Clear liquid diet>>advance to full liquids -lipase 36 -COVID/Flu/RSV neg   Failure to thrive/generalized weakness -UA negative for pyuria -01/18/2023 TSH 7.334, free T4--0.93 -Serum [id number] -Folic [id number] -PT evaluation>>SNF   Chronic respiratory failure with hypoxia/COPD -Chronically on 2 L at rest, 4 L with activity -Continue DuoNebs -Patient has nearly 80-pack-year history -Quit smoking 2 years prior to this admission -Continue Breo and Incruse   Pulmonary infiltrate -Suspect this is residual infiltrate from previously treated pneumonia -Check PCT <0.10 -COVID-19 PCR--neg -remain off abx   CKD stage IIIb -Patient has had wide variations in her renal function -Appears that baseline is near 1.4-1.6 -Serial BMPs   Hypotension -Lactic acid 1.2 -Continue IV fluids>>improving -A.m. cortisol--9.0 -Antihypertensive medications discontinued last month   Hyperammonemia -Clinical significance unclear as the patient is alert and oriented x 3 -Presented with ammonia 53>>32 -Monitor clinically   Hypothyroidism -Continue Synthroid -Thyroid function studies as above   Depression/anxiety -Continue Zoloft   Mixed hyperlipidemia -Hold Crestor temporarily due to elevated LFTs and elevated CK   Lung nodule -Incidental finding on CT cervical spine -Outpatient follow-up   Chronic low back pain -PDMP reviewed--no controlled substances prescribed or filled in the  last 2 years   Morbid obesity -BMI 42.12 -Lifestyle  modification   Loose stool -Check C. Difficile--neg -Check stool pathogen panel--neg   Rhabdomyolysis -CK 891>>466 -continue IVF      Family Communication: no  Family at bedside  Consultants:  none  Code Status:  FULL   DVT Prophylaxis:  [place] Heparin    Procedures: As Listed in Progress Note Above  Antibiotics: None   Subjective: Pt states vomiting and diarrhea are improving.  Denies f/c, cp, sob, cough, hematochezia, melena  Objective: Vitals:   01/31/23 0500 01/31/23 0928 01/31/23 1005 01/31/23 1342  BP: (!) 106/56 (!) 119/52  (!) 136/51  Pulse: (!) 58 (!) 57  60  Resp: 20 18    Temp: 98.1 F (36.7 C) 98.1 F (36.7 C)  98.2 F (36.8 C)  TempSrc:  Oral  Oral  SpO2: 94% 97% 91% 94%  Weight: 106.6 kg     Height:        Intake/Output Summary (Last 24 hours) at 01/31/2023 1719 Last data filed at 01/31/2023 1712 Gross per 24 hour  Intake 240 ml  Output 1725 ml  Net -1485 ml   Weight change: -4.7 kg Exam:  General:  Pt is alert, follows commands appropriately, not in acute distress HEENT: No icterus, No thrush, No neck mass, [place]/AT Cardiovascular: RRR, S1/S2, no rubs, no gallops Respiratory: CTA bilaterally, no wheezing, no crackles, no rhonchi Abdomen: Soft/+BS, non tender, non distended, no guarding Extremities: No edema, No lymphangitis, No petechiae, No rashes, no synovitis   Data Reviewed: I have personally reviewed following labs and imaging studies Basic Metabolic Panel: Recent Labs  Lab 01/29/23 2018 01/30/23 0458 01/31/23 0551  NA 135 140 140  K 4.0 3.6 3.1*  CL 94* 101 100  CO2 30 29 29  $ GLUCOSE 96 79 82  BUN 10 10 10  $ CREATININE 1.63* 1.59* 1.50*  CALCIUM 8.5* 8.6* 8.7*  MG 1.8  --   --    Liver Function Tests: Recent Labs  Lab 01/29/23 2018 01/30/23 0458 01/31/23 0551  AST 78* 71* 66*  ALT 25 22 21  $ ALKPHOS 82 76 79  BILITOT 0.7 0.6 0.5  PROT 7.5 7.1 7.7  ALBUMIN 2.6* 2.5* 2.8*   Recent Labs  Lab 01/29/23 2018   LIPASE 36   Recent Labs  Lab 01/29/23 2018 01/31/23 0551  AMMONIA 53* 32   Coagulation Profile: No results for input(s): "INR", "PROTIME" in the last 168 hours. CBC: Recent Labs  Lab 01/29/23 2018 01/30/23 0458 01/31/23 0551  WBC 9.3 8.0 7.6  NEUTROABS 5.5  --   --   HGB 9.1* 8.6* 9.1*  HCT 30.9* 29.1* 30.4*  MCV 96.9 97.0 96.8  PLT 572* 531* 518*   Cardiac Enzymes: Recent Labs  Lab 01/29/23 2018 01/31/23 0551  CKTOTAL 891* 466*   BNP: Invalid input(s): "POCBNP" CBG: No results for input(s): "GLUCAP" in the last 168 hours. HbA1C: No results for input(s): "HGBA1C" in the last 72 hours. Urine analysis:    Component Value Date/Time   COLORURINE YELLOW 01/29/2023 [place] 01/29/2023 2201   LABSPEC 1.008 01/29/2023 2201   PHURINE 6.0 01/29/2023 2201   GLUCOSEU NEGATIVE 01/29/2023 2201   HGBUR SMALL (A) 01/29/2023 2201   HGBUR negative 05/03/2009 1012   BILIRUBINUR NEGATIVE 01/29/2023 2201   BILIRUBINUR neg 01/15/2015 1433   KETONESUR NEGATIVE 01/29/2023 2201   PROTEINUR NEGATIVE 01/29/2023 2201   UROBILINOGEN 0.2 01/15/2015 1433   UROBILINOGEN 0.2 05/03/2009 1012  NITRITE NEGATIVE 01/29/2023 2201   LEUKOCYTESUR NEGATIVE 01/29/2023 2201   Sepsis Labs: @LABRCNTIP$ (procalcitonin:4,lacticidven:4) ) Recent Results (from the past 240 hour(s))  Culture, blood (routine x 2)     Status: None (Preliminary result)   Collection Time: 01/29/23  9:08 PM   Specimen: BLOOD  Result Value Ref Range Status   Specimen Description BLOOD RIGHT ANTECUBITAL  Final   Special Requests   Final    BOTTLES DRAWN AEROBIC AND ANAEROBIC Blood Culture adequate volume   Culture   Final    NO GROWTH 2 DAYS Performed at [facility], [address], [place], [place] [address]    Report Status PENDING  Incomplete  Culture, blood (routine x 2)     Status: None (Preliminary result)   Collection Time: 01/29/23  9:08 PM   Specimen: BLOOD  Result Value Ref Range Status    Specimen Description BLOOD BLOOD RIGHT HAND  Final   Special Requests   Final    BOTTLES DRAWN AEROBIC AND ANAEROBIC Blood Culture results may not be optimal due to an inadequate volume of blood received in culture bottles   Culture   Final    NO GROWTH 2 DAYS Performed at [facility], [address] [place], [place] [address]    Report Status PENDING  Incomplete  Resp panel by RT-PCR (RSV, Flu A&B, Covid) Anterior Nasal Swab     Status: None   Collection Time: 01/30/23 10:20 AM   Specimen: Anterior Nasal Swab  Result Value Ref Range Status   SARS Coronavirus 2 by RT PCR NEGATIVE NEGATIVE Final    Comment: (NOTE) SARS-CoV-2 target nucleic acids are NOT DETECTED.  The SARS-CoV-2 RNA is generally detectable in upper respiratory specimens during the acute phase of infection. The lowest concentration of SARS-CoV-2 viral copies this assay can detect is 138 copies/mL. A negative result does not preclude SARS-Cov-2 infection and should not be used as the sole basis for treatment or other patient management decisions. A negative result may occur with  improper specimen collection/handling, submission of specimen other than nasopharyngeal swab, presence of viral mutation(s) within the areas targeted by this assay, and inadequate number of viral copies(<138 copies/mL). A negative result must be combined with clinical observations, patient history, and epidemiological information. The expected result is Negative.  Fact Sheet for Patients:  EntrepreneurPulse.com.au  Fact Sheet for Healthcare Providers:  IncredibleEmployment.be  This test is no t yet approved or cleared by the [place] FDA and  has been authorized for detection and/or diagnosis of SARS-CoV-2 by FDA under an Emergency Use Authorization (EUA). This EUA will remain  in effect (meaning this test can be used) for the duration of the COVID-19 declaration under Section 564(b)(1) of the Act,  21 U.S.C.section 360bbb-3(b)(1), unless the authorization is terminated  or revoked sooner.       Influenza A by PCR NEGATIVE NEGATIVE Final   Influenza B by PCR NEGATIVE NEGATIVE Final    Comment: (NOTE) The Xpert Xpress SARS-CoV-2/FLU/RSV plus assay is intended as an aid in the diagnosis of influenza from Nasopharyngeal swab specimens and should not be used as a sole basis for treatment. Nasal washings and aspirates are unacceptable for Xpert Xpress SARS-CoV-2/FLU/RSV testing.  Fact Sheet for Patients: EntrepreneurPulse.com.au  Fact Sheet for Healthcare Providers: IncredibleEmployment.be  This test is not yet approved or cleared by the [place] FDA and has been authorized for detection and/or diagnosis of SARS-CoV-2 by FDA under an Emergency Use Authorization (EUA). This EUA will remain in effect (  meaning this test can be used) for the duration of the COVID-19 declaration under Section 564(b)(1) of the Act, 21 U.S.C. section 360bbb-3(b)(1), unless the authorization is terminated or revoked.     Resp Syncytial Virus by PCR NEGATIVE NEGATIVE Final    Comment: (NOTE) Fact Sheet for Patients: EntrepreneurPulse.com.au  Fact Sheet for Healthcare Providers: IncredibleEmployment.be  This test is not yet approved or cleared by the [place] FDA and has been authorized for detection and/or diagnosis of SARS-CoV-2 by FDA under an Emergency Use Authorization (EUA). This EUA will remain in effect (meaning this test can be used) for the duration of the COVID-19 declaration under Section 564(b)(1) of the Act, 21 U.S.C. section 360bbb-3(b)(1), unless the authorization is terminated or revoked.  Performed at [facility], [address], [place], [place] [address]   C Difficile Quick Screen w PCR reflex     Status: None   Collection Time: 01/30/23  5:00 PM   Specimen: STOOL  Result Value Ref Range Status    C Diff antigen NEGATIVE NEGATIVE Final   C Diff toxin NEGATIVE NEGATIVE Final   C Diff interpretation No C. difficile detected.  Final    Comment: Performed at [facility], [address] [place], [place] [address]  Gastrointestinal Panel by PCR , Stool     Status: None   Collection Time: 01/30/23  5:00 PM   Specimen: STOOL  Result Value Ref Range Status   Campylobacter species NOT DETECTED NOT DETECTED Final   Plesimonas shigelloides NOT DETECTED NOT DETECTED Final   Salmonella species NOT DETECTED NOT DETECTED Final   Yersinia enterocolitica NOT DETECTED NOT DETECTED Final   Vibrio species NOT DETECTED NOT DETECTED Final   Vibrio cholerae NOT DETECTED NOT DETECTED Final   Enteroaggregative E coli (EAEC) NOT DETECTED NOT DETECTED Final   Enteropathogenic E coli (EPEC) NOT DETECTED NOT DETECTED Final   Enterotoxigenic E coli (ETEC) NOT DETECTED NOT DETECTED Final   Shiga like toxin producing E coli (STEC) NOT DETECTED NOT DETECTED Final   Shigella/Enteroinvasive E coli (EIEC) NOT DETECTED NOT DETECTED Final   Cryptosporidium NOT DETECTED NOT DETECTED Final   Cyclospora cayetanensis NOT DETECTED NOT DETECTED Final   Entamoeba histolytica NOT DETECTED NOT DETECTED Final   Giardia lamblia NOT DETECTED NOT DETECTED Final   Adenovirus F40/41 NOT DETECTED NOT DETECTED Final   Astrovirus NOT DETECTED NOT DETECTED Final   Norovirus GI/GII NOT DETECTED NOT DETECTED Final   Rotavirus A NOT DETECTED NOT DETECTED Final   Sapovirus (I, II, IV, and V) NOT DETECTED NOT DETECTED Final    Comment: Performed at [facility], [address] [place],  [address]     Scheduled Meds:  (feeding supplement) PROSource Plus  30 mL Oral BID BM   feeding supplement  1 Container Oral TID BM   fluticasone furoate-vilanterol  1 puff Inhalation Daily   And   umeclidinium bromide  1 puff Inhalation Daily   gabapentin  1,200 mg Oral QHS   heparin  5,000 Units Subcutaneous Q8H    levothyroxine  88 mcg Oral Daily   metoCLOPramide (REGLAN) injection  5 mg Intravenous Q6H   midodrine  10 mg Oral TID WC   multivitamin with minerals  1 tablet Oral Daily   ondansetron (ZOFRAN) IV  4 mg Intravenous Q6H   pantoprazole  40 mg Oral Daily   sertraline  200 mg Oral Daily   sodium chloride flush  3 mL Intravenous Q12H   Continuous Infusions:  lactated ringers 75 mL/hr at 01/31/23 1241    Procedures/Studies: CT ABDOMEN PELVIS WO CONTRAST  Result Date: 01/30/2023 CLINICAL DATA:  Generalized abdominal pain and vomiting with nausea. Inpatient. EXAM: CT ABDOMEN AND PELVIS WITHOUT CONTRAST TECHNIQUE: Multidetector CT imaging of the abdomen and pelvis was performed following the standard protocol without IV contrast. RADIATION DOSE REDUCTION: This exam was performed according to the departmental dose-optimization program which includes automated exposure control, adjustment of the mA and/or kV according to patient size and/or use of iterative reconstruction technique. COMPARISON:  01/29/2023 abdominal radiograph. FINDINGS: Lower chest: Extensive patchy consolidation, centrilobular nodularity and interlobular septal thickening at the left lung base with similar but less prominent findings at the right lung base, new from 03/02/2021 chest CT angiogram study. Hepatobiliary: Normal liver size. No liver mass. Cholecystectomy. No biliary ductal dilatation. Pancreas: Normal, with no mass or duct dilation. Spleen: Normal size. No mass. Adrenals/Urinary Tract: Normal adrenals. No renal stones. No hydronephrosis. No contour deforming renal masses. Normal bladder. Stomach/Bowel: Normal non-distended stomach. Normal caliber small bowel with no small bowel wall thickening. Normal appendix. Normal large bowel with no diverticulosis, large bowel wall thickening or pericolonic fat stranding. Vascular/Lymphatic: Atherosclerotic nonaneurysmal abdominal aorta. No pathologically enlarged lymph nodes in the abdomen  or pelvis. Reproductive: Status post hysterectomy, with no abnormal findings at the vaginal cuff. No adnexal mass. Other: No pneumoperitoneum, ascites or focal fluid collection. Musculoskeletal: No aggressive appearing focal osseous lesions. Marked degenerative disc disease in the lower lumbar spine. IMPRESSION: 1. Extensive patchy consolidation, centrilobular nodularity and interlobular septal thickening at the left lung base with similar but less prominent findings at the right lung base, new from 03/02/2021 chest CT angiogram study. Findings are incompletely evaluated but most suggestive of multilobar bronchopneumonia, potentially due to aspiration. Suggest follow-up chest CT to document resolution. 2. No acute abnormality in the abdomen or pelvis. No evidence of bowel obstruction or acute bowel inflammation. 3.  Aortic Atherosclerosis (ICD10-I70.0). Electronically Signed   By: Ilona Sorrel M.D.   On: 01/30/2023 12:49   DG Abd 1 View  Result Date: 01/29/2023 CLINICAL DATA:  Nausea and vomiting EXAM: ABDOMEN - 1 VIEW COMPARISON:  None Available. FINDINGS: 2 supine frontal views of the abdomen and pelvis are obtained. No bowel obstruction or ileus. No masses or abnormal calcifications. No acute bony abnormality. Patchy consolidation at the left lung base. IMPRESSION: 1. Unremarkable bowel gas pattern. 2. Patchy left basilar consolidation consistent with pneumonia. Electronically Signed   By: Randa Ngo M.D.   On: 01/29/2023 23:15   CT Head Wo Contrast  Result Date: 01/29/2023 CLINICAL DATA:  Fall with head and neck trauma. EXAM: CT HEAD WITHOUT CONTRAST CT CERVICAL SPINE WITHOUT CONTRAST TECHNIQUE: Multidetector CT imaging of the head and cervical spine was performed following the standard protocol without intravenous contrast. Multiplanar CT image reconstructions of the cervical spine were also generated. RADIATION DOSE REDUCTION: This exam was performed according to the departmental dose-optimization  program which includes automated exposure control, adjustment of the mA and/or kV according to patient size and/or use of iterative reconstruction technique. COMPARISON:  CT head 03/02/2021. No prior cervical spine films or cross-sectional imaging. FINDINGS: CT HEAD FINDINGS Brain: No evidence of acute infarction, hemorrhage, hydrocephalus, extra-axial collection or mass lesion/mass effect. There is mild cerebral atrophy and early small-vessel disease in the cerebral white matter. Cerebellum and brainstem are unremarkable. Vascular: No hyperdense vessel or unexpected calcification. Skull: Negative for fractures or focal lesions. No scalp hematoma is seen. Sinuses/Orbits: No acute  orbital findings. Old lens extractions. Nasal septum deviates broadly to the right. There is mild membrane thickening in the left maxillary sinus. Other visualized sinuses, left mastoid air cells are clear. There is chronic fluid in the right mastoid tip. Other: None. CT CERVICAL SPINE FINDINGS Technical note: Very limited image detail below the level of C4. This is due to a combination of patient motion artifact, superimposition of the patient's shoulders causing beam hardening and loss of image detail, and patient habitus. Alignment: Slightly reversed cervical lordosis, minimal levoscoliosis. There is slight anterolisthesis at C5-6 which probably due to discogenic degenerative arthrosis. No traumatic listhesis is suspected. No other listhesis is seen. Narrowing and spurring noted anterior atlantodental joint. 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 As above very limited study for fracture detection below C4. No obvious compression fracture is seen or displaced fractures. No primary pathologic process is grossly suspected. Soft tissues and spinal canal: Spinal canal contents obscured due to beam hardening and motion below C4. Above C4-5, there is no visible canal hematoma. There is no  precervical soft tissue swelling. There is marked fatty infiltration of the parotid glands. There are calcifications in the left-greater-than-right proximal cervical ICAs. No laryngeal or thyroid mass is seen. Disc levels: Not well evaluated. There is moderate disc space loss at C5-6 and C7-T1, otherwise gross preservation of the normal disc heights. There is mild spondylosis. Possible there could be spinal stenosis due to posterior disc osteophyte complexes at C5-6, C6-7 and C7-T1 but this is speculative at best given the degree of obscuration of the spinal canal contents. There are mild facet spurring changes but no significant bony foraminal encroachment. Upper chest: Ill-defined airspace consolidation medial left lung apex. Separate from this there is an 8 mm left apical nodule on 4:82, not seen on the CTA chest from 03/02/2021. Right lung apex is clear. Other: None. IMPRESSION: 1. No acute intracranial CT findings or depressed skull fractures. 2. Mild atrophy and early small-vessel disease. 3. Very limited cervical spine CT due to patient motion, superimposition of the patient's shoulders, and beam hardening. No obvious compression fractures or displaced fractures are seen. 4. 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 Slight cervical kypholevoscoliosis. 5. 8 mm left apical nodule, not seen on the CTA chest from 03/02/2021. Possible this could be an inflammatory nodule given the finding of adjacent airspace disease, but nonspecific. 6. Non-contrast chest CT at 6-12 months is recommended. If the nodule is stable at time of repeat CT, then future CT at 18-24 months (from today's scan) is considered optional for low-risk patients, but is recommended for high-risk patients. This recommendation follows the consensus statement: Guidelines for Management of Incidental Pulmonary Nodules Detected on CT Images: From the Fleischner Society 2017;  Radiology 2017; 284:228-243. 7. Ill-defined airspace consolidation medial left lung apex. Correlate clinically for pneumonia. Follow-up as indicated. 8. Vascular calcifications left-greater-than-right proximal cervical ICAs. Electronically Signed   By: Telford Nab M.D.   On: 01/29/2023 20:59   CT Cervical Spine Wo Contrast  Result Date: 01/29/2023 CLINICAL DATA:  Fall with head and neck trauma. EXAM: CT HEAD WITHOUT CONTRAST CT CERVICAL SPINE WITHOUT CONTRAST TECHNIQUE: Multidetector CT imaging of the head and cervical spine was performed following the standard protocol without intravenous contrast. Multiplanar CT image reconstructions of the cervical spine were also generated. RADIATION DOSE REDUCTION: This exam was performed according to the departmental dose-optimization program which includes automated  exposure control, adjustment of the mA and/or kV according to patient size and/or use of iterative reconstruction technique. COMPARISON:  CT head 03/02/2021. No prior cervical spine films or cross-sectional imaging. FINDINGS: CT HEAD FINDINGS Brain: No evidence of acute infarction, hemorrhage, hydrocephalus, extra-axial collection or mass lesion/mass effect. There is mild cerebral atrophy and early small-vessel disease in the cerebral white matter. Cerebellum and brainstem are unremarkable. Vascular: No hyperdense vessel or unexpected calcification. Skull: Negative for fractures or focal lesions. No scalp hematoma is seen. Sinuses/Orbits: No acute orbital findings. Old lens extractions. Nasal septum deviates broadly to the right. There is mild membrane thickening in the left maxillary sinus. Other visualized sinuses, left mastoid air cells are clear. There is chronic fluid in the right mastoid tip. Other: None. CT CERVICAL SPINE FINDINGS Technical note: Very limited image detail below the level of C4. This is due to a combination of patient motion artifact, superimposition of the patient's shoulders causing  beam hardening and loss of image detail, and patient habitus. Alignment: Slightly reversed cervical lordosis, minimal levoscoliosis. There is slight anterolisthesis at C5-6 which probably due to discogenic degenerative arthrosis. No traumatic listhesis is suspected. No other listhesis is seen. Narrowing and spurring noted anterior atlantodental joint. 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 As above very limited study for fracture detection below C4. No obvious compression fracture is seen or displaced fractures. No primary pathologic process is grossly suspected. Soft tissues and spinal canal: Spinal canal contents obscured due to beam hardening and motion below C4. Above C4-5, there is no visible canal hematoma. There is no precervical soft tissue swelling. There is marked fatty infiltration of the parotid glands. There are calcifications in the left-greater-than-right proximal cervical ICAs. No laryngeal or thyroid mass is seen. Disc levels: Not well evaluated. There is moderate disc space loss at C5-6 and C7-T1, otherwise gross preservation of the normal disc heights. There is mild spondylosis. Possible there could be spinal stenosis due to posterior disc osteophyte complexes at C5-6, C6-7 and C7-T1 but this is speculative at best given the degree of obscuration of the spinal canal contents. There are mild facet spurring changes but no significant bony foraminal encroachment. Upper chest: Ill-defined airspace consolidation medial left lung apex. Separate from this there is an 8 mm left apical nodule on 4:82, not seen on the CTA chest from 03/02/2021. Right lung apex is clear. Other: None. IMPRESSION: 1. No acute intracranial CT findings or depressed skull fractures. 2. Mild atrophy and early small-vessel disease. 3. Very limited cervical spine CT due to patient motion, superimposition of the patient's shoulders, and beam hardening. No obvious compression  fractures or displaced fractures are seen. 4. 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 Slight cervical kypholevoscoliosis. 5. 8 mm left apical nodule, not seen on the CTA chest from 03/02/2021. Possible this could be an inflammatory nodule given the finding of adjacent airspace disease, but nonspecific. 6. Non-contrast chest CT at 6-12 months is recommended. If the nodule is stable at time of repeat CT, then future CT at 18-24 months (from today's scan) is considered optional for low-risk patients, but is recommended for high-risk patients. This recommendation follows the consensus statement: Guidelines for Management of Incidental Pulmonary Nodules Detected on CT Images: From the Fleischner Society 2017; Radiology 2017; 284:228-243. 7. Ill-defined airspace consolidation medial left lung apex. Correlate clinically for pneumonia. Follow-up as indicated. 8. Vascular calcifications left-greater-than-right proximal cervical ICAs.  Electronically Signed   By: Telford Nab M.D.   On: 01/29/2023 20:59   DG Chest Port 1 View  Result Date: 01/29/2023 CLINICAL DATA:  Weakness and shortness of breath. EXAM: PORTABLE CHEST 1 VIEW COMPARISON:  Chest radiograph dated 01/15/2023. FINDINGS: There is mild diffuse chronic interstitial coarsening. Faint area of increased density in the subpleural right upper lobe, may represent chronic changes and sequela prior inflammation/infection. However, this has slightly progressed since the prior radiograph and concerning for recurrent pneumonia. Additional scattered smaller nodular densities improved since the prior radiograph. No pleural effusion or pneumothorax. The cardiac silhouette is within normal limits. No acute osseous pathology. IMPRESSION: Persistent opacity in the subpleural right upper lobe. Additional scattered nodular densities improved since the prior radiograph. Continued follow-up recommended.  Electronically Signed   By: Anner Crete M.D.   On: 01/29/2023 20:42   DG Chest Port 1 View  Result Date: 01/15/2023 CLINICAL DATA:  Questionable sepsis. EXAM: PORTABLE CHEST 1 VIEW COMPARISON:  Chest radiograph and CT dated 03/02/2021. FINDINGS: Scatter pulmonary nodules as seen on the prior CT most consistent with multi lobar pneumonia, possibly atypical in etiology. Clinical correlation and follow-up to resolution recommended. There is slight blunting of the left costophrenic angle which may represent scarring or trace pleural effusion. No pneumothorax. The cardiac silhouette is within normal limits. No acute osseous pathology. IMPRESSION: Multi lobar pneumonia. Clinical correlation and follow-up to resolution recommended. Electronically Signed   By: Anner Crete M.D.   On: 01/15/2023 20:30    Orson Eva, DO  [facility]  If 7PM-7AM, please contact night-coverage [email or web address] Password [facility] 01/31/2023, 5:19 PM   LOS: 1 day

## 2023-01-31 NOTE — TOC Progression Note (Signed)
Transition of Care [facility]) - Progression Note    Patient Details  Name: CERISSA HOUCK MRN: [id number] Date of Birth: [birth year]  Transition of Care [facility]) CM/SW [place], [place] Phone Number: 01/31/2023, 10:08 AM  Clinical Narrative:    CSW added PT notes to pts SNF insurance auth. Auth is pending at this time. TOC to follow.   Expected Discharge Plan: [place] Barriers to Discharge: Continued Medical Work up  Expected Discharge Plan and Services In-house Referral: Clinical Social Work Discharge Planning Services: CM Consult Post Acute Care Choice: [place] Living arrangements for the past 2 months: Single Family Home                                       Social Determinants of Health (SDOH) Interventions SDOH Screenings   Food Insecurity: No Food Insecurity (01/30/2023)  Housing: Low Risk  (01/30/2023)  Transportation Needs: No Transportation Needs (01/30/2023)  Utilities: Not At Risk (01/30/2023)  Depression (PHQ2-9): High Risk (12/16/2022)  Tobacco Use: Medium Risk (01/30/2023)    Readmission Risk Interventions    01/17/2023    3:21 PM 01/16/2023   10:07 AM  Readmission Risk Prevention Plan  Transportation Screening Complete Complete  HRI or [place]  Complete  Social Work Consult for [place] Planning/Counseling  Complete  Palliative Care Screening  Not Applicable  Medication Review [employer])  Complete

## 2023-02-01 DIAGNOSIS — R112 Nausea with vomiting, unspecified: Secondary | ICD-10-CM | POA: Diagnosis not present

## 2023-02-01 DIAGNOSIS — R627 Adult failure to thrive: Secondary | ICD-10-CM | POA: Diagnosis not present

## 2023-02-01 DIAGNOSIS — J9611 Chronic respiratory failure with hypoxia: Secondary | ICD-10-CM | POA: Diagnosis not present

## 2023-02-01 DIAGNOSIS — N1832 Chronic kidney disease, stage 3b: Secondary | ICD-10-CM | POA: Diagnosis not present

## 2023-02-01 LAB — COMPREHENSIVE METABOLIC PANEL
ALT: 16 U/L (ref 0–44)
AST: 50 U/L — ABNORMAL HIGH (ref 15–41)
Albumin: 2.3 g/dL — ABNORMAL LOW (ref 3.5–5.0)
Alkaline Phosphatase: 64 U/L (ref 38–126)
Anion gap: 7 (ref 5–15)
BUN: 9 mg/dL (ref 6–20)
CO2: 28 mmol/L (ref 22–32)
Calcium: 8.2 mg/dL — ABNORMAL LOW (ref 8.9–10.3)
Chloride: 104 mmol/L (ref 98–111)
Creatinine, Ser: 1.37 mg/dL — ABNORMAL HIGH (ref 0.44–1.00)
GFR, Estimated: 44 mL/min — ABNORMAL LOW (ref >60)
Glucose, Bld: 87 mg/dL (ref 70–99)
Potassium: 3.3 mmol/L — ABNORMAL LOW (ref 3.5–5.1)
Sodium: 139 mmol/L (ref 135–145)
Total Bilirubin: 0.5 mg/dL (ref 0.3–1.2)
Total Protein: 6.3 g/dL — ABNORMAL LOW (ref 6.5–8.1)

## 2023-02-01 LAB — CBC
HCT: 27.7 % — ABNORMAL LOW (ref 36.0–46.0)
Hemoglobin: 8.3 g/dL — ABNORMAL LOW (ref 12.0–15.0)
MCH: 28.7 pg (ref 26.0–34.0)
MCHC: 30 g/dL (ref 30.0–36.0)
MCV: 95.8 fL (ref 80.0–100.0)
Platelets: 482 10*3/uL — ABNORMAL HIGH (ref 150–400)
RBC: 2.89 MIL/uL — ABNORMAL LOW (ref 3.87–5.11)
RDW: 15.7 % — ABNORMAL HIGH (ref 11.5–15.5)
WBC: 7.1 10*3/uL (ref 4.0–10.5)
nRBC: 0 % (ref 0.0–0.2)

## 2023-02-01 LAB — MAGNESIUM: Magnesium: 1.9 mg/dL (ref 1.7–2.4)

## 2023-02-01 LAB — TROPONIN I (HIGH SENSITIVITY)
Troponin I (High Sensitivity): 11 ng/L (ref <18)
Troponin I (High Sensitivity): 9 ng/L (ref <18)

## 2023-02-01 MED ORDER — OXYCODONE HCL 5 MG PO TABS: 5.0000 mg | ORAL_TABLET | Freq: Three times a day (TID) | ORAL | 0 refills | Status: DC | PRN

## 2023-02-01 MED ORDER — ONDANSETRON 4 MG PO TBDP
4.0000 mg | ORAL_TABLET | Freq: Four times a day (QID) | ORAL | Status: DC | PRN
  Administered 2023-02-01 – 2023-02-02 (×2): 4 mg via ORAL
  Filled 2023-02-01 (×3): qty 1

## 2023-02-01 MED ORDER — ALPRAZOLAM 0.5 MG PO TABS: 0.5000 mg | ORAL_TABLET | Freq: Two times a day (BID) | ORAL | 0 refills | Status: DC | PRN

## 2023-02-01 MED ORDER — METOCLOPRAMIDE HCL 10 MG PO TABS
5.0000 mg | ORAL_TABLET | Freq: Four times a day (QID) | ORAL | Status: DC
  Administered 2023-02-01 – 2023-02-02 (×6): 5 mg via ORAL
  Filled 2023-02-01 (×6): qty 1

## 2023-02-01 MED ORDER — POTASSIUM CHLORIDE CRYS ER 20 MEQ PO TBCR
40.0000 meq | EXTENDED_RELEASE_TABLET | Freq: Once | ORAL | Status: AC
  Administered 2023-02-01: 40 meq via ORAL
  Filled 2023-02-01: qty 2

## 2023-02-01 NOTE — Progress Notes (Signed)
[facility] night cross cover note:   I was notified by RN that peripheral IV access on this patient has been lost and attempts to reestablish peripheral access have been unsuccessful so far.  RN conveys that the patient has order for scheduled IV Reglan as well as order for as needed IV Zofran for nausea.  I subsequently changed both of these IV orders to PO.     Babs Bertin, DO Hospitalist

## 2023-02-01 NOTE — Discharge Summary (Signed)
Physician Discharge Summary   Patient: Krystal Burgess MRN: [id number] DOB: 1961/02/04  Admit date:     01/29/2023  Discharge date: {dischdate:26783}  Discharge Physician: Shanon Brow Cerita Rabelo   PCP: Hali Marry, MD   Recommendations at discharge:   Please follow up with primary care provider within 1-2 weeks  Please repeat BMP and CBC in one week      Hospital Course: 62 year old female with a history of COPD, chronic respiratory failure on 2 L, CKD stage III, anxiety/depression, OSA, hypertension, hyperlipidemia, and hypothyroidism presenting with progressive weakness and nausea and vomiting.  The patient was recently admitted to the hospital from 01/15/2023 to 01/20/23 with sepsis secondary to pneumonia.  The patient required vasopressors for short period of time.  She was treated with ceftriaxone and azithromycin and finished a course of antibiotics during the hospitalization.  She did have increased oxygen demand, but was weaned back to her baseline at the time of discharge. Discharge to SNF had been recommended but the patient elected to go home instead. She reports being too generally weak to ambulate without falling, and is even having difficulty getting out of bed on her own.   The patient also had a previous hospital admission from 12/02/2022 to 12/08/2022 at [facility].  She was treated for pneumonia and AKI at that time.  The patient states that she continues to have intermittent nausea and vomiting and dry heaves.  She has had some subjective fevers and chills.  She denies any chest pain, shortness of breath, worsening cough, hemoptysis, hematochezia, melena.  She does have some loose stools.  She has some abdominal discomfort primarily in the upper abdomen.  She denies any new medications since discharge.  In the ED, the patient was afebrile and hypotensive initially with blood pressure of 75/59.  She was given 1 L normal saline and Zofran.  WBC 9.3, hemoglobin 9.1, platelets 572,000.   Sodium 135, potassium 4.0, bicarbonate 30, serum creatinine 1.63.  CT of the brain was negative.  CT of the cervical spine was negative for any fracture or dislocation.  It did show an 8 mm left apical nodule.  UA was negative for pyuria.  CK was 891.  Lactic acid 1.2.  The patient was admitted for further evaluation of her nausea and vomiting and failure to thrive.  Assessment and Plan: Intractable nausea and vomiting -continue around-the-clock Zofran>>stop 2/10 evening -Continue metoclopramide -Continue IV fluids -CT abdomen and pelvis--patchy consolidation, centrilobular nodularity and interlobular septal thickening LLL>RLL; no acute intra-abd findings -continue pantoprazole -UA negative for pyuria -Clear liquid diet>>advance to full liquids>>cardiac diet on 2/11 which pt is tolerating -lipase 36 -COVID/Flu/RSV neg   Failure to thrive/generalized weakness -UA negative for pyuria -01/18/2023 TSH 7.334, free T4--0.93 -Serum 123456 [id number] -Folic [id number] -PT evaluation>>SNF   Chronic respiratory failure with hypoxia/COPD -Chronically on 2 L at rest, 4 L with activity -Continue DuoNebs -Patient has nearly 80-pack-year history -Quit smoking 2 years prior to this admission -Continue Breo and Incruse   Pulmonary infiltrate -Suspect this is residual infiltrate from previously treated pneumonia -Check PCT <0.10 -COVID-19 PCR--neg -remain off abx -remains stable, afebrile   CKD stage IIIb -Patient has had wide variations in her renal function -Appears that baseline is near 1.4-1.6 -Serial BMPs   Hypotension -Lactic acid 1.2 -Continue IV fluids>>improving -A.m. cortisol--9.0 -Antihypertensive medications discontinued last month -overall improved and stable   Hyperammonemia -Clinical significance unclear as the patient is alert and oriented x 3 -Presented with ammonia 53>>32 -Monitor clinically  Hypothyroidism -Continue Synthroid -Thyroid function studies as above    Depression/anxiety -Continue Zoloft   Mixed hyperlipidemia -Hold Crestor temporarily due to elevated LFTs and elevated CK   Lung nodule -Incidental finding on CT cervical spine -Outpatient follow-up   Chronic low back pain -PDMP reviewed--no controlled substances prescribed or filled in the last 2 years   Morbid obesity -BMI 42.12 -Lifestyle modification   Loose stool -Check C. Difficile--neg -Check stool pathogen panel--neg   Rhabdomyolysis -CK 891>>466 -continued IVF   Hypokalemia -replete    {Tip this will not be part of the note when signed Body mass index is 40.42 kg/m. ,  Nutrition Documentation    Flowsheet Row ED to Hosp-Admission (Current) from 01/29/2023 in [facility]  Nutrition Problem Inadequate oral intake  Etiology nausea, vomiting  Nutrition Goal Patient will meet greater than or equal to 90% of their needs  Interventions Boost Breeze, MVI, Prostat     ,  (Optional):26781}  {(NOTE) Pain control PDMP Statment (Optional):26782} Consultants: *** Procedures performed: ***  Disposition: {Plan; Disposition:26390} Diet recommendation:  {Diet_Plan:26776} DISCHARGE MEDICATION: Allergies as of 02/01/2023       Reactions   Risperidone And Related Shortness Of Breath   Aripiprazole Other (See Comments)   REACTION: muscle jerks   Fluoxetine Hcl Other (See Comments)   REACTION: Intolerant   Nsaids Other (See Comments)   Upper GI bleed   Paroxetine Other (See Comments)   REACTION: Intolerance   Ziprasidone Hcl Other (See Comments)   shakes   Chantix [varenicline Tartrate] Other (See Comments)   "messing with my mood"   Metformin And Related Nausea Only   Montelukast Other (See Comments)   "it kept me sick with an upper respiratory infection"   Onglyza [saxagliptin] Nausea Only   Augmentin [amoxicillin-pot Clavulanate] Other (See Comments)   Gets yeast infection every time   Fluoxetine Hcl    REACTION: Intolerant         Medication List     STOP taking these medications    B-D 3CC LUER-LOK SYR 25GX1" 25G X 1" 3 ML Misc Generic drug: SYRINGE-NEEDLE (DISP) 3 ML   Fusion Plus Caps   ipratropium 0.02 % nebulizer solution Commonly known as: ATROVENT   Melatonin 10 MG Tabs       TAKE these medications    albuterol 108 (90 Base) MCG/ACT inhaler Commonly known as: VENTOLIN HFA INHALE 2 PUFFS INTO THE LUNGS EVERY 4 HOURS AS NEEDED FOR WHEEZING OR SHORTNESS OF BREATH.   ALPRAZolam 0.5 MG tablet Commonly known as: XANAX Take 1 tablet (0.5 mg total) by mouth 2 (two) times daily as needed for anxiety. What changed:  medication strength how much to take reasons to take this additional instructions   AMBULATORY NON FORMULARY MEDICATION Medication Name: Portable battery-powered nebulizer.Dx: COPD. [place] [phone] ([phone])   AMBULATORY NON FORMULARY MEDICATION Medication Name: Upright standing Rollator with seat. Dx: code M51.36   fluticasone 50 MCG/ACT nasal spray Commonly known as: FLONASE SPRAY 2 SPRAYS INTO EACH NOSTRIL EVERY DAY   folic acid 1 MG tablet Commonly known as: FOLVITE TAKE 1 TABLET BY MOUTH EVERY DAY   gabapentin 600 MG tablet Commonly known as: NEURONTIN Take 1 tablet (600 mg total) by mouth 2 (two) times daily. Patient takes 1200 mg daily What changed:  when to take this additional instructions   ipratropium 0.06 % nasal spray Commonly known as: ATROVENT Place 2 sprays into both nostrils 4 (four) times daily.  ipratropium-albuterol 0.5-2.5 (3) MG/3ML Soln Commonly known as: DUONEB Take 3 mLs by nebulization every 2 (two) hours as needed (shortness of breath).   levocetirizine 5 MG tablet Commonly known as: XYZAL TAKE 1 TABLET BY MOUTH EVERY DAY IN THE EVENING   levothyroxine 88 MCG tablet Commonly known as: SYNTHROID Take 1 tablet (88 mcg total) by mouth daily.   midodrine 10 MG tablet Commonly known as: PROAMATINE Take 1  tablet (10 mg total) by mouth 3 (three) times daily with meals.   ondansetron 8 MG tablet Commonly known as: ZOFRAN Take 8 mg by mouth 3 (three) times daily.   oxyCODONE 5 MG immediate release tablet Commonly known as: Oxy IR/ROXICODONE Take 1 tablet (5 mg total) by mouth every 8 (eight) hours as needed for severe pain. What changed:  medication strength how much to take when to take this reasons to take this additional instructions   pantoprazole 40 MG tablet Commonly known as: PROTONIX TAKE 1 TABLET BY MOUTH EVERY DAY What changed: when to take this   rosuvastatin 20 MG tablet Commonly known as: CRESTOR TAKE 1 TABLET BY MOUTH EVERY DAY   sertraline 100 MG tablet Commonly known as: ZOLOFT TAKE 2 TABLETS BY MOUTH EVERY DAY   theophylline 300 MG 12 hr tablet Commonly known as: THEODUR TAKE 1 TABLET BY MOUTH TWICE A DAY   tiZANidine 4 MG tablet Commonly known as: ZANAFLEX TAKE 1 TABLET BY MOUTH AT BEDTIME AS NEEDED FOR MUSCLE SPASMS. What changed: See the new instructions.   traZODone 100 MG tablet Commonly known as: DESYREL TAKE 1 TABLET BY MOUTH EVERYDAY AT BEDTIME What changed: See the new instructions.   Trelegy Ellipta 100-62.5-25 MCG/ACT Aepb Generic drug: Fluticasone-Umeclidin-Vilant TAKE 1 PUFF BY MOUTH EVERY DAY What changed: See the new instructions.        Discharge Exam: Filed Weights   01/30/23 0434 01/31/23 0500 02/01/23 0327  Weight: 102.5 kg 106.6 kg 106.8 kg   ***  Condition at discharge: {DC Condition:26389}  The results of significant diagnostics from this hospitalization (including imaging, microbiology, ancillary and laboratory) are listed below for reference.   Imaging Studies: CT ABDOMEN PELVIS WO CONTRAST  Result Date: 01/30/2023 CLINICAL DATA:  Generalized abdominal pain and vomiting with nausea. Inpatient. EXAM: CT ABDOMEN AND PELVIS WITHOUT CONTRAST TECHNIQUE: Multidetector CT imaging of the abdomen and pelvis was performed  following the standard protocol without IV contrast. RADIATION DOSE REDUCTION: This exam was performed according to the departmental dose-optimization program which includes automated exposure control, adjustment of the mA and/or kV according to patient size and/or use of iterative reconstruction technique. COMPARISON:  01/29/2023 abdominal radiograph. FINDINGS: Lower chest: Extensive patchy consolidation, centrilobular nodularity and interlobular septal thickening at the left lung base with similar but less prominent findings at the right lung base, new from 03/02/2021 chest CT angiogram study. Hepatobiliary: Normal liver size. No liver mass. Cholecystectomy. No biliary ductal dilatation. Pancreas: Normal, with no mass or duct dilation. Spleen: Normal size. No mass. Adrenals/Urinary Tract: Normal adrenals. No renal stones. No hydronephrosis. No contour deforming renal masses. Normal bladder. Stomach/Bowel: Normal non-distended stomach. Normal caliber small bowel with no small bowel wall thickening. Normal appendix. Normal large bowel with no diverticulosis, large bowel wall thickening or pericolonic fat stranding. Vascular/Lymphatic: Atherosclerotic nonaneurysmal abdominal aorta. No pathologically enlarged lymph nodes in the abdomen or pelvis. Reproductive: Status post hysterectomy, with no abnormal findings at the vaginal cuff. No adnexal mass. Other: No pneumoperitoneum, ascites or focal fluid collection. Musculoskeletal: No aggressive appearing  focal osseous lesions. Marked degenerative disc disease in the lower lumbar spine. IMPRESSION: 1. Extensive patchy consolidation, centrilobular nodularity and interlobular septal thickening at the left lung base with similar but less prominent findings at the right lung base, new from 03/02/2021 chest CT angiogram study. Findings are incompletely evaluated but most suggestive of multilobar bronchopneumonia, potentially due to aspiration. Suggest follow-up chest CT to  document resolution. 2. No acute abnormality in the abdomen or pelvis. No evidence of bowel obstruction or acute bowel inflammation. 3.  Aortic Atherosclerosis (ICD10-I70.0). Electronically Signed   By: Ilona Sorrel M.D.   On: 01/30/2023 12:49   DG Abd 1 View  Result Date: 01/29/2023 CLINICAL DATA:  Nausea and vomiting EXAM: ABDOMEN - 1 VIEW COMPARISON:  None Available. FINDINGS: 2 supine frontal views of the abdomen and pelvis are obtained. No bowel obstruction or ileus. No masses or abnormal calcifications. No acute bony abnormality. Patchy consolidation at the left lung base. IMPRESSION: 1. Unremarkable bowel gas pattern. 2. Patchy left basilar consolidation consistent with pneumonia. Electronically Signed   By: Randa Ngo M.D.   On: 01/29/2023 23:15   CT Head Wo Contrast  Result Date: 01/29/2023 CLINICAL DATA:  Fall with head and neck trauma. EXAM: CT HEAD WITHOUT CONTRAST CT CERVICAL SPINE WITHOUT CONTRAST TECHNIQUE: Multidetector CT imaging of the head and cervical spine was performed following the standard protocol without intravenous contrast. Multiplanar CT image reconstructions of the cervical spine were also generated. RADIATION DOSE REDUCTION: This exam was performed according to the departmental dose-optimization program which includes automated exposure control, adjustment of the mA and/or kV according to patient size and/or use of iterative reconstruction technique. COMPARISON:  CT head 03/02/2021. No prior cervical spine films or cross-sectional imaging. FINDINGS: CT HEAD FINDINGS Brain: No evidence of acute infarction, hemorrhage, hydrocephalus, extra-axial collection or mass lesion/mass effect. There is mild cerebral atrophy and early small-vessel disease in the cerebral white matter. Cerebellum and brainstem are unremarkable. Vascular: No hyperdense vessel or unexpected calcification. Skull: Negative for fractures or focal lesions. No scalp hematoma is seen. Sinuses/Orbits: No acute  orbital findings. Old lens extractions. Nasal septum deviates broadly to the right. There is mild membrane thickening in the left maxillary sinus. Other visualized sinuses, left mastoid air cells are clear. There is chronic fluid in the right mastoid tip. Other: None. CT CERVICAL SPINE FINDINGS Technical note: Very limited image detail below the level of C4. This is due to a combination of patient motion artifact, superimposition of the patient's shoulders causing beam hardening and loss of image detail, and patient habitus. Alignment: Slightly reversed cervical lordosis, minimal levoscoliosis. There is slight anterolisthesis at C5-6 which probably due to discogenic degenerative arthrosis. No traumatic listhesis is suspected. No other listhesis is seen. Narrowing and spurring noted anterior atlantodental joint. 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 As above very limited study for fracture detection below C4. No obvious compression fracture is seen or displaced fractures. No primary pathologic process is grossly suspected. Soft tissues and spinal canal: Spinal canal contents obscured due to beam hardening and motion below C4. Above C4-5, there is no visible canal hematoma. There is no precervical soft tissue swelling. There is marked fatty infiltration of the parotid glands. There are calcifications in the left-greater-than-right proximal cervical ICAs. No laryngeal or thyroid mass is seen. Disc levels: Not well evaluated. There is moderate disc space loss at C5-6 and C7-T1, otherwise gross preservation of the normal disc heights. There  is mild spondylosis. Possible there could be spinal stenosis due to posterior disc osteophyte complexes at C5-6, C6-7 and C7-T1 but this is speculative at best given the degree of obscuration of the spinal canal contents. There are mild facet spurring changes but no significant bony foraminal encroachment. Upper chest:  Ill-defined airspace consolidation medial left lung apex. Separate from this there is an 8 mm left apical nodule on 4:82, not seen on the CTA chest from 03/02/2021. Right lung apex is clear. Other: None. IMPRESSION: 1. No acute intracranial CT findings or depressed skull fractures. 2. Mild atrophy and early small-vessel disease. 3. Very limited cervical spine CT due to patient motion, superimposition of the patient's shoulders, and beam hardening. No obvious compression fractures or displaced fractures are seen. 4. 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 Slight cervical kypholevoscoliosis. 5. 8 mm left apical nodule, not seen on the CTA chest from 03/02/2021. Possible this could be an inflammatory nodule given the finding of adjacent airspace disease, but nonspecific. 6. Non-contrast chest CT at 6-12 months is recommended. If the nodule is stable at time of repeat CT, then future CT at 18-24 months (from today's scan) is considered optional for low-risk patients, but is recommended for high-risk patients. This recommendation follows the consensus statement: Guidelines for Management of Incidental Pulmonary Nodules Detected on CT Images: From the Fleischner Society 2017; Radiology 2017; 284:228-243. 7. Ill-defined airspace consolidation medial left lung apex. Correlate clinically for pneumonia. Follow-up as indicated. 8. Vascular calcifications left-greater-than-right proximal cervical ICAs. Electronically Signed   By: Telford Nab M.D.   On: 01/29/2023 20:59   CT Cervical Spine Wo Contrast  Result Date: 01/29/2023 CLINICAL DATA:  Fall with head and neck trauma. EXAM: CT HEAD WITHOUT CONTRAST CT CERVICAL SPINE WITHOUT CONTRAST TECHNIQUE: Multidetector CT imaging of the head and cervical spine was performed following the standard protocol without intravenous contrast. Multiplanar CT image reconstructions of the cervical spine were also  generated. RADIATION DOSE REDUCTION: This exam was performed according to the departmental dose-optimization program which includes automated exposure control, adjustment of the mA and/or kV according to patient size and/or use of iterative reconstruction technique. COMPARISON:  CT head 03/02/2021. No prior cervical spine films or cross-sectional imaging. FINDINGS: CT HEAD FINDINGS Brain: No evidence of acute infarction, hemorrhage, hydrocephalus, extra-axial collection or mass lesion/mass effect. There is mild cerebral atrophy and early small-vessel disease in the cerebral white matter. Cerebellum and brainstem are unremarkable. Vascular: No hyperdense vessel or unexpected calcification. Skull: Negative for fractures or focal lesions. No scalp hematoma is seen. Sinuses/Orbits: No acute orbital findings. Old lens extractions. Nasal septum deviates broadly to the right. There is mild membrane thickening in the left maxillary sinus. Other visualized sinuses, left mastoid air cells are clear. There is chronic fluid in the right mastoid tip. Other: None. CT CERVICAL SPINE FINDINGS Technical note: Very limited image detail below the level of C4. This is due to a combination of patient motion artifact, superimposition of the patient's shoulders causing beam hardening and loss of image detail, and patient habitus. Alignment: Slightly reversed cervical lordosis, minimal levoscoliosis. There is slight anterolisthesis at C5-6 which probably due to discogenic degenerative arthrosis. No traumatic listhesis is suspected. No other listhesis is seen. Narrowing and spurring noted anterior atlantodental joint. 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 As above very limited study for fracture detection below C4. No obvious compression fracture is seen or  displaced fractures. No primary pathologic process is grossly suspected. Soft tissues and spinal canal: Spinal canal contents  obscured due to beam hardening and motion below C4. Above C4-5, there is no visible canal hematoma. There is no precervical soft tissue swelling. There is marked fatty infiltration of the parotid glands. There are calcifications in the left-greater-than-right proximal cervical ICAs. No laryngeal or thyroid mass is seen. Disc levels: Not well evaluated. There is moderate disc space loss at C5-6 and C7-T1, otherwise gross preservation of the normal disc heights. There is mild spondylosis. Possible there could be spinal stenosis due to posterior disc osteophyte complexes at C5-6, C6-7 and C7-T1 but this is speculative at best given the degree of obscuration of the spinal canal contents. There are mild facet spurring changes but no significant bony foraminal encroachment. Upper chest: Ill-defined airspace consolidation medial left lung apex. Separate from this there is an 8 mm left apical nodule on 4:82, not seen on the CTA chest from 03/02/2021. Right lung apex is clear. Other: None. IMPRESSION: 1. No acute intracranial CT findings or depressed skull fractures. 2. Mild atrophy and early small-vessel disease. 3. Very limited cervical spine CT due to patient motion, superimposition of the patient's shoulders, and beam hardening. No obvious compression fractures or displaced fractures are seen. 4. 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 Slight cervical kypholevoscoliosis. 5. 8 mm left apical nodule, not seen on the CTA chest from 03/02/2021. Possible this could be an inflammatory nodule given the finding of adjacent airspace disease, but nonspecific. 6. Non-contrast chest CT at 6-12 months is recommended. If the nodule is stable at time of repeat CT, then future CT at 18-24 months (from today's scan) is considered optional for low-risk patients, but is recommended for high-risk patients. This recommendation follows the consensus statement:  Guidelines for Management of Incidental Pulmonary Nodules Detected on CT Images: From the Fleischner Society 2017; Radiology 2017; 284:228-243. 7. Ill-defined airspace consolidation medial left lung apex. Correlate clinically for pneumonia. Follow-up as indicated. 8. Vascular calcifications left-greater-than-right proximal cervical ICAs. Electronically Signed   By: Telford Nab M.D.   On: 01/29/2023 20:59   DG Chest Port 1 View  Result Date: 01/29/2023 CLINICAL DATA:  Weakness and shortness of breath. EXAM: PORTABLE CHEST 1 VIEW COMPARISON:  Chest radiograph dated 01/15/2023. FINDINGS: There is mild diffuse chronic interstitial coarsening. Faint area of increased density in the subpleural right upper lobe, may represent chronic changes and sequela prior inflammation/infection. However, this has slightly progressed since the prior radiograph and concerning for recurrent pneumonia. Additional scattered smaller nodular densities improved since the prior radiograph. No pleural effusion or pneumothorax. The cardiac silhouette is within normal limits. No acute osseous pathology. IMPRESSION: Persistent opacity in the subpleural right upper lobe. Additional scattered nodular densities improved since the prior radiograph. Continued follow-up recommended. Electronically Signed   By: Anner Crete M.D.   On: 01/29/2023 20:42   DG Chest Port 1 View  Result Date: 01/15/2023 CLINICAL DATA:  Questionable sepsis. EXAM: PORTABLE CHEST 1 VIEW COMPARISON:  Chest radiograph and CT dated 03/02/2021. FINDINGS: Scatter pulmonary nodules as seen on the prior CT most consistent with multi lobar pneumonia, possibly atypical in etiology. Clinical correlation and follow-up to resolution recommended. There is slight blunting of the left costophrenic angle which may represent scarring or trace pleural effusion. No pneumothorax. The cardiac silhouette is within normal limits. No acute osseous pathology. IMPRESSION: Multi lobar  pneumonia. Clinical correlation and follow-up to resolution  recommended. Electronically Signed   By: Anner Crete M.D.   On: 01/15/2023 20:30    Microbiology: Results for orders placed or performed during the hospital encounter of 01/29/23  Culture, blood (routine x 2)     Status: None (Preliminary result)   Collection Time: 01/29/23  9:08 PM   Specimen: BLOOD  Result Value Ref Range Status   Specimen Description BLOOD RIGHT ANTECUBITAL  Final   Special Requests   Final    BOTTLES DRAWN AEROBIC AND ANAEROBIC Blood Culture adequate volume   Culture   Final    NO GROWTH 3 DAYS Performed at [facility], [address], [place], [place] [address]    Report Status PENDING  Incomplete  Culture, blood (routine x 2)     Status: None (Preliminary result)   Collection Time: 01/29/23  9:08 PM   Specimen: BLOOD  Result Value Ref Range Status   Specimen Description BLOOD BLOOD RIGHT HAND  Final   Special Requests   Final    BOTTLES DRAWN AEROBIC AND ANAEROBIC Blood Culture results may not be optimal due to an inadequate volume of blood received in culture bottles   Culture   Final    NO GROWTH 3 DAYS Performed at [facility], [address], [place],  [address]    Report Status PENDING  Incomplete  Resp panel by RT-PCR (RSV, Flu A&B, Covid) Anterior Nasal Swab     Status: None   Collection Time: 01/30/23 10:20 AM   Specimen: Anterior Nasal Swab  Result Value Ref Range Status   SARS Coronavirus 2 by RT PCR NEGATIVE NEGATIVE Final    Comment: (NOTE) SARS-CoV-2 target nucleic acids are NOT DETECTED.  The SARS-CoV-2 RNA is generally detectable in upper respiratory specimens during the acute phase of infection. The lowest concentration of SARS-CoV-2 viral copies this assay can detect is 138 copies/mL. A negative result does not preclude SARS-Cov-2 infection and should not be used as the sole basis for treatment or other patient management decisions. A negative result may occur with   improper specimen collection/handling, submission of specimen other than nasopharyngeal swab, presence of viral mutation(s) within the areas targeted by this assay, and inadequate number of viral copies(<138 copies/mL). A negative result must be combined with clinical observations, patient history, and epidemiological information. The expected result is Negative.  Fact Sheet for Patients:  EntrepreneurPulse.com.au  Fact Sheet for Healthcare Providers:  IncredibleEmployment.be  This test is no t yet approved or cleared by the [place] FDA and  has been authorized for detection and/or diagnosis of SARS-CoV-2 by FDA under an Emergency Use Authorization (EUA). This EUA will remain  in effect (meaning this test can be used) for the duration of the COVID-19 declaration under Section 564(b)(1) of the Act, 21 U.S.C.section 360bbb-3(b)(1), unless the authorization is terminated  or revoked sooner.       Influenza A by PCR NEGATIVE NEGATIVE Final   Influenza B by PCR NEGATIVE NEGATIVE Final    Comment: (NOTE) The Xpert Xpress SARS-CoV-2/FLU/RSV plus assay is intended as an aid in the diagnosis of influenza from Nasopharyngeal swab specimens and should not be used as a sole basis for treatment. Nasal washings and aspirates are unacceptable for Xpert Xpress SARS-CoV-2/FLU/RSV testing.  Fact Sheet for Patients: EntrepreneurPulse.com.au  Fact Sheet for Healthcare Providers: IncredibleEmployment.be  This test is not yet approved or cleared by the [place] FDA and has been authorized for detection and/or diagnosis of SARS-CoV-2 by FDA under an Emergency Use Authorization (  EUA). This EUA will remain in effect (meaning this test can be used) for the duration of the COVID-19 declaration under Section 564(b)(1) of the Act, 21 U.S.C. section 360bbb-3(b)(1), unless the authorization is terminated or revoked.      Resp Syncytial Virus by PCR NEGATIVE NEGATIVE Final    Comment: (NOTE) Fact Sheet for Patients: EntrepreneurPulse.com.au  Fact Sheet for Healthcare Providers: IncredibleEmployment.be  This test is not yet approved or cleared by the [place] FDA and has been authorized for detection and/or diagnosis of SARS-CoV-2 by FDA under an Emergency Use Authorization (EUA). This EUA will remain in effect (meaning this test can be used) for the duration of the COVID-19 declaration under Section 564(b)(1) of the Act, 21 U.S.C. section 360bbb-3(b)(1), unless the authorization is terminated or revoked.  Performed at [facility], [address] [place], [place] [address]   C Difficile Quick Screen w PCR reflex     Status: None   Collection Time: 01/30/23  5:00 PM   Specimen: STOOL  Result Value Ref Range Status   C Diff antigen NEGATIVE NEGATIVE Final   C Diff toxin NEGATIVE NEGATIVE Final   C Diff interpretation No C. difficile detected.  Final    Comment: Performed at [facility], [address] [place], [place] [address]  Gastrointestinal Panel by PCR , Stool     Status: None   Collection Time: 01/30/23  5:00 PM   Specimen: STOOL  Result Value Ref Range Status   Campylobacter species NOT DETECTED NOT DETECTED Final   Plesimonas shigelloides NOT DETECTED NOT DETECTED Final   Salmonella species NOT DETECTED NOT DETECTED Final   Yersinia enterocolitica NOT DETECTED NOT DETECTED Final   Vibrio species NOT DETECTED NOT DETECTED Final   Vibrio cholerae NOT DETECTED NOT DETECTED Final   Enteroaggregative E coli (EAEC) NOT DETECTED NOT DETECTED Final   Enteropathogenic E coli (EPEC) NOT DETECTED NOT DETECTED Final   Enterotoxigenic E coli (ETEC) NOT DETECTED NOT DETECTED Final   Shiga like toxin producing E coli (STEC) NOT DETECTED NOT DETECTED Final   Shigella/Enteroinvasive E coli (EIEC) NOT DETECTED NOT DETECTED Final   Cryptosporidium NOT DETECTED  NOT DETECTED Final   Cyclospora cayetanensis NOT DETECTED NOT DETECTED Final   Entamoeba histolytica NOT DETECTED NOT DETECTED Final   Giardia lamblia NOT DETECTED NOT DETECTED Final   Adenovirus F40/41 NOT DETECTED NOT DETECTED Final   Astrovirus NOT DETECTED NOT DETECTED Final   Norovirus GI/GII NOT DETECTED NOT DETECTED Final   Rotavirus A NOT DETECTED NOT DETECTED Final   Sapovirus (I, II, IV, and V) NOT DETECTED NOT DETECTED Final    Comment: Performed at [facility], [address] [place], [place] [address]   *Note: Due to a large number of results and/or encounters for the requested time period, some results have not been displayed. A complete set of results can be found in Results Review.    Labs: CBC: Recent Labs  Lab 01/29/23 2018 01/30/23 0458 01/31/23 0551 02/01/23 0530  WBC 9.3 8.0 7.6 7.1  NEUTROABS 5.5  --   --   --   HGB 9.1* 8.6* 9.1* 8.3*  HCT 30.9* 29.1* 30.4* 27.7*  MCV 96.9 97.0 96.8 95.8  PLT 572* 531* 518* [id number]*   Basic Metabolic Panel: Recent Labs  Lab 01/29/23 2018 01/30/23 0458 01/31/23 0551 02/01/23 0530  NA 135 140 140 139  K 4.0 3.6 3.1* 3.3*  CL 94* 101 100 104  CO2 30 29 29 $ 28  GLUCOSE 96 79 82 87  BUN 10 10 10 9  $ CREATININE 1.63* 1.59* 1.50* 1.37*  CALCIUM 8.5* 8.6* 8.7* 8.2*  MG 1.8  --   --  1.9   Liver Function Tests: Recent Labs  Lab 01/29/23 2018 01/30/23 0458 01/31/23 0551 02/01/23 0530  AST 78* 71* 66* 50*  ALT 25 22 21 16  $ ALKPHOS 82 76 79 64  BILITOT 0.7 0.6 0.5 0.5  PROT 7.5 7.1 7.7 6.3*  ALBUMIN 2.6* 2.5* 2.8* 2.3*   CBG: No results for input(s): "GLUCAP" in the last 168 hours.  Discharge time spent: {LESS THAN/GREATER DI:[id number] 30 minutes.  Signed: Orson Eva, MD [facility] 02/01/2023

## 2023-02-01 NOTE — Progress Notes (Signed)
PROGRESS NOTE  Krystal Burgess [id number] DOB: [birth year] DOA: 01/29/2023 PCP: Hali Marry, MD  Brief History:  62 year old female with a history of COPD, chronic respiratory failure on 2 L, CKD stage III, anxiety/depression, OSA, hypertension, hyperlipidemia, and hypothyroidism presenting with progressive weakness and nausea and vomiting.  The patient was recently admitted to the hospital from 01/15/2023 to 01/20/23 with sepsis secondary to pneumonia.  The patient required vasopressors for short period of time.  She was treated with ceftriaxone and azithromycin and finished a course of antibiotics during the hospitalization.  She did have increased oxygen demand, but was weaned back to her baseline at the time of discharge. Discharge to SNF had been recommended but the patient elected to go home instead. She reports being too generally weak to ambulate without falling, and is even having difficulty getting out of bed on her own.   The patient also had a previous hospital admission from 12/02/2022 to 12/08/2022 at [facility]  She was treated for pneumonia and AKI at that time.  The patient states that she continues to have intermittent nausea and vomiting and dry heaves.  She has had some subjective fevers and chills.  She denies any chest pain, shortness of breath, worsening cough, hemoptysis, hematochezia, melena.  She does have some loose stools.  She has some abdominal discomfort primarily in the upper abdomen.  She denies any new medications since discharge.  In the ED, the patient was afebrile and hypotensive initially with blood pressure of 75/59.  She was given 1 L normal saline and Zofran.  WBC 9.3, hemoglobin 9.1, platelets 572,000.  Sodium 135, potassium 4.0, bicarbonate 30, serum creatinine 1.63.  CT of the brain was negative.  CT of the cervical spine was negative for any fracture or dislocation.  It did show an 8 mm left apical nodule.  UA was negative for pyuria.  CK was  891.  Lactic acid 1.2.  The patient was admitted for further evaluation of her nausea and vomiting and failure to thrive.   Assessment/Plan: Intractable nausea and vomiting -continue around-the-clock Zofran>>stop 2/10 evening -Continue metoclopramide -Continue IV fluids -CT abdomen and pelvis--patchy consolidation, centrilobular nodularity and interlobular septal thickening LLL>RLL; no acute intra-abd findings -continue pantoprazole -UA negative for pyuria -Clear liquid diet>>advance to full liquids>>cardiac diet on 2/11 -lipase 36 -COVID/Flu/RSV neg   Failure to thrive/generalized weakness -UA negative for pyuria -01/18/2023 TSH 7.334, free T4--0.93 -Serum [id number] -Folic [id number] -PT evaluation>>SNF   Chronic respiratory failure with hypoxia/COPD -Chronically on 2 L at rest, 4 L with activity -Continue DuoNebs -Patient has nearly 80-pack-year history -Quit smoking 2 years prior to this admission -Continue Breo and Incruse   Pulmonary infiltrate -Suspect this is residual infiltrate from previously treated pneumonia -Check PCT <0.10 -COVID-19 PCR--neg -remain off abx   CKD stage IIIb -Patient has had wide variations in her renal function -Appears that baseline is near 1.4-1.6 -Serial BMPs   Hypotension -Lactic acid 1.2 -Continue IV fluids>>improving -A.m. cortisol--9.0 -Antihypertensive medications discontinued last month   Hyperammonemia -Clinical significance unclear as the patient is alert and oriented x 3 -Presented with ammonia 53>>32 -Monitor clinically   Hypothyroidism -Continue Synthroid -Thyroid function studies as above   Depression/anxiety -Continue Zoloft   Mixed hyperlipidemia -Hold Crestor temporarily due to elevated LFTs and elevated CK   Lung nodule -Incidental finding on CT cervical spine -Outpatient follow-up   Chronic low back pain -PDMP reviewed--no controlled substances prescribed or filled  in the last 2 years   Morbid  obesity -BMI 42.12 -Lifestyle modification   Loose stool -Check C. Difficile--neg -Check stool pathogen panel--neg   Rhabdomyolysis -CK 891>>466 -continued IVF  Hypokalemia -replete   Family Communication:  no Family at bedside   Consultants:  none   Code Status:  FULL    DVT Prophylaxis:  Jack Heparin     Procedures: As Listed in Progress Note Above   Antibiotics: None              Subjective: Pt is feeling better.  N/v are improved.  Denies f/c, cp, sob, diarrhea.  +BM  Objective: Vitals:   01/31/23 1950 01/31/23 2019 02/01/23 0327 02/01/23 1125  BP:  112/63 (!) 113/53 124/79  Pulse:  (!) 57 (!) 58 66  Resp:  18 18 18  $ Temp:  98.2 F (36.8 C) 98.6 F (37 C) 97.9 F (36.6 C)  TempSrc:  Oral Oral Oral  SpO2: 96% 96% 96% 94%  Weight:   106.8 kg   Height:        Intake/Output Summary (Last 24 hours) at 02/01/2023 1552 Last data filed at 02/01/2023 1507 Gross per 24 hour  Intake 3055.92 ml  Output 1325 ml  Net 1730.92 ml   Weight change: 0.2 kg Exam:  General:  Pt is alert, follows commands appropriately, not in acute distress HEENT: No icterus, No thrush, No neck mass, [place]/AT Cardiovascular: RRR, S1/S2, no rubs, no gallops Respiratory: CTA bilaterally, no wheezing, no crackles, no rhonchi Abdomen: Soft/+BS, non tender, non distended, no guarding Extremities: No edema, No lymphangitis, No petechiae, No rashes, no synovitis   Data Reviewed: I have personally reviewed following labs and imaging studies Basic Metabolic Panel: Recent Labs  Lab 01/29/23 2018 01/30/23 0458 01/31/23 0551 02/01/23 0530  NA 135 140 140 139  K 4.0 3.6 3.1* 3.3*  CL 94* 101 100 104  CO2 30 29 29 28  $ GLUCOSE 96 79 82 87  BUN 10 10 10 9  $ CREATININE 1.63* 1.59* 1.50* 1.37*  CALCIUM 8.5* 8.6* 8.7* 8.2*  MG 1.8  --   --  1.9   Liver Function Tests: Recent Labs  Lab 01/29/23 2018 01/30/23 0458 01/31/23 0551 02/01/23 0530  AST 78* 71* 66* 50*  ALT 25 22 21  16  $ ALKPHOS 82 76 79 64  BILITOT 0.7 0.6 0.5 0.5  PROT 7.5 7.1 7.7 6.3*  ALBUMIN 2.6* 2.5* 2.8* 2.3*   Recent Labs  Lab 01/29/23 2018  LIPASE 36   Recent Labs  Lab 01/29/23 2018 01/31/23 0551  AMMONIA 53* 32   Coagulation Profile: No results for input(s): "INR", "PROTIME" in the last 168 hours. CBC: Recent Labs  Lab 01/29/23 2018 01/30/23 0458 01/31/23 0551 02/01/23 0530  WBC 9.3 8.0 7.6 7.1  NEUTROABS 5.5  --   --   --   HGB 9.1* 8.6* 9.1* 8.3*  HCT 30.9* 29.1* 30.4* 27.7*  MCV 96.9 97.0 96.8 95.8  PLT 572* 531* 518* 482*   Cardiac Enzymes: Recent Labs  Lab 01/29/23 2018 01/31/23 0551  CKTOTAL 891* 466*   BNP: Invalid input(s): "POCBNP" CBG: No results for input(s): "GLUCAP" in the last 168 hours. HbA1C: No results for input(s): "HGBA1C" in the last 72 hours. Urine analysis:    Component Value Date/Time   COLORURINE YELLOW 01/29/2023 [place] 01/29/2023 2201   LABSPEC 1.008 01/29/2023 2201   PHURINE 6.0 01/29/2023 2201   GLUCOSEU NEGATIVE 01/29/2023 2201   HGBUR SMALL (  A) 01/29/2023 2201   HGBUR negative 05/03/2009 1012   BILIRUBINUR NEGATIVE 01/29/2023 2201   BILIRUBINUR neg 01/15/2015 1433   KETONESUR NEGATIVE 01/29/2023 2201   PROTEINUR NEGATIVE 01/29/2023 2201   UROBILINOGEN 0.2 01/15/2015 1433   UROBILINOGEN 0.2 05/03/2009 1012   NITRITE NEGATIVE 01/29/2023 2201   LEUKOCYTESUR NEGATIVE 01/29/2023 2201   Sepsis Labs: @LABRCNTIP$ (procalcitonin:4,lacticidven:4) ) Recent Results (from the past 240 hour(s))  Culture, blood (routine x 2)     Status: None (Preliminary result)   Collection Time: 01/29/23  9:08 PM   Specimen: BLOOD  Result Value Ref Range Status   Specimen Description BLOOD RIGHT ANTECUBITAL  Final   Special Requests   Final    BOTTLES DRAWN AEROBIC AND ANAEROBIC Blood Culture adequate volume   Culture   Final    NO GROWTH 3 DAYS Performed at [facility], [address], [place], [place] [address]    Report  Status PENDING  Incomplete  Culture, blood (routine x 2)     Status: None (Preliminary result)   Collection Time: 01/29/23  9:08 PM   Specimen: BLOOD  Result Value Ref Range Status   Specimen Description BLOOD BLOOD RIGHT HAND  Final   Special Requests   Final    BOTTLES DRAWN AEROBIC AND ANAEROBIC Blood Culture results may not be optimal due to an inadequate volume of blood received in culture bottles   Culture   Final    NO GROWTH 3 DAYS Performed at [facility], [address], [place], [place] [address]    Report Status PENDING  Incomplete  Resp panel by RT-PCR (RSV, Flu A&B, Covid) Anterior Nasal Swab     Status: None   Collection Time: 01/30/23 10:20 AM   Specimen: Anterior Nasal Swab  Result Value Ref Range Status   SARS Coronavirus 2 by RT PCR NEGATIVE NEGATIVE Final    Comment: (NOTE) SARS-CoV-2 target nucleic acids are NOT DETECTED.  The SARS-CoV-2 RNA is generally detectable in upper respiratory specimens during the acute phase of infection. The lowest concentration of SARS-CoV-2 viral copies this assay can detect is 138 copies/mL. A negative result does not preclude SARS-Cov-2 infection and should not be used as the sole basis for treatment or other patient management decisions. A negative result may occur with  improper specimen collection/handling, submission of specimen other than nasopharyngeal swab, presence of viral mutation(s) within the areas targeted by this assay, and inadequate number of viral copies(<138 copies/mL). A negative result must be combined with clinical observations, patient history, and epidemiological information. The expected result is Negative.  Fact Sheet for Patients:  EntrepreneurPulse.com.au  Fact Sheet for Healthcare Providers:  IncredibleEmployment.be  This test is no t yet approved or cleared by the [place] FDA and  has been authorized for detection and/or diagnosis of SARS-CoV-2 by FDA  under an Emergency Use Authorization (EUA). This EUA will remain  in effect (meaning this test can be used) for the duration of the COVID-19 declaration under Section 564(b)(1) of the Act, 21 U.S.C.section 360bbb-3(b)(1), unless the authorization is terminated  or revoked sooner.       Influenza A by PCR NEGATIVE NEGATIVE Final   Influenza B by PCR NEGATIVE NEGATIVE Final    Comment: (NOTE) The Xpert Xpress SARS-CoV-2/FLU/RSV plus assay is intended as an aid in the diagnosis of influenza from Nasopharyngeal swab specimens and should not be used as a sole basis for treatment. Nasal washings and aspirates are unacceptable for Xpert Xpress SARS-CoV-2/FLU/RSV testing.  Fact Sheet for  Patients: EntrepreneurPulse.com.au  Fact Sheet for Healthcare Providers: IncredibleEmployment.be  This test is not yet approved or cleared by the [place] FDA and has been authorized for detection and/or diagnosis of SARS-CoV-2 by FDA under an Emergency Use Authorization (EUA). This EUA will remain in effect (meaning this test can be used) for the duration of the COVID-19 declaration under Section 564(b)(1) of the Act, 21 U.S.C. section 360bbb-3(b)(1), unless the authorization is terminated or revoked.     Resp Syncytial Virus by PCR NEGATIVE NEGATIVE Final    Comment: (NOTE) Fact Sheet for Patients: EntrepreneurPulse.com.au  Fact Sheet for Healthcare Providers: IncredibleEmployment.be  This test is not yet approved or cleared by the [place] FDA and has been authorized for detection and/or diagnosis of SARS-CoV-2 by FDA under an Emergency Use Authorization (EUA). This EUA will remain in effect (meaning this test can be used) for the duration of the COVID-19 declaration under Section 564(b)(1) of the Act, 21 U.S.C. section 360bbb-3(b)(1), unless the authorization is terminated or revoked.  Performed at [facility], [address], [place], [place] [address]   C Difficile Quick Screen w PCR reflex     Status: None   Collection Time: 01/30/23  5:00 PM   Specimen: STOOL  Result Value Ref Range Status   C Diff antigen NEGATIVE NEGATIVE Final   C Diff toxin NEGATIVE NEGATIVE Final   C Diff interpretation No C. difficile detected.  Final    Comment: Performed at [facility], [address], [place], [place] [address]  Gastrointestinal Panel by PCR , Stool     Status: None   Collection Time: 01/30/23  5:00 PM   Specimen: STOOL  Result Value Ref Range Status   Campylobacter species NOT DETECTED NOT DETECTED Final   Plesimonas shigelloides NOT DETECTED NOT DETECTED Final   Salmonella species NOT DETECTED NOT DETECTED Final   Yersinia enterocolitica NOT DETECTED NOT DETECTED Final   Vibrio species NOT DETECTED NOT DETECTED Final   Vibrio cholerae NOT DETECTED NOT DETECTED Final   Enteroaggregative E coli (EAEC) NOT DETECTED NOT DETECTED Final   Enteropathogenic E coli (EPEC) NOT DETECTED NOT DETECTED Final   Enterotoxigenic E coli (ETEC) NOT DETECTED NOT DETECTED Final   Shiga like toxin producing E coli (STEC) NOT DETECTED NOT DETECTED Final   Shigella/Enteroinvasive E coli (EIEC) NOT DETECTED NOT DETECTED Final   Cryptosporidium NOT DETECTED NOT DETECTED Final   Cyclospora cayetanensis NOT DETECTED NOT DETECTED Final   Entamoeba histolytica NOT DETECTED NOT DETECTED Final   Giardia lamblia NOT DETECTED NOT DETECTED Final   Adenovirus F40/41 NOT DETECTED NOT DETECTED Final   Astrovirus NOT DETECTED NOT DETECTED Final   Norovirus GI/GII NOT DETECTED NOT DETECTED Final   Rotavirus A NOT DETECTED NOT DETECTED Final   Sapovirus (I, II, IV, and V) NOT DETECTED NOT DETECTED Final    Comment: Performed at [facility], [address] [place], [place] [address]     Scheduled Meds:  (feeding supplement) PROSource Plus  30 mL Oral BID BM   feeding supplement  1 Container Oral TID BM    fluticasone furoate-vilanterol  1 puff Inhalation Daily   And   umeclidinium bromide  1 puff Inhalation Daily   gabapentin  1,200 mg Oral QHS   heparin  5,000 Units Subcutaneous Q8H   levothyroxine  88 mcg Oral Daily   metoCLOPramide  5 mg Oral Q6H   midodrine  10 mg Oral TID WC   multivitamin with minerals  1 tablet Oral Daily   pantoprazole  40 mg Oral Daily   sertraline  200 mg Oral Daily   sodium chloride flush  3 mL Intravenous Q12H   Continuous Infusions:  lactated ringers 75 mL/hr at 01/31/23 1241    Procedures/Studies: CT ABDOMEN PELVIS WO CONTRAST  Result Date: 01/30/2023 CLINICAL DATA:  Generalized abdominal pain and vomiting with nausea. Inpatient. EXAM: CT ABDOMEN AND PELVIS WITHOUT CONTRAST TECHNIQUE: Multidetector CT imaging of the abdomen and pelvis was performed following the standard protocol without IV contrast. RADIATION DOSE REDUCTION: This exam was performed according to the departmental dose-optimization program which includes automated exposure control, adjustment of the mA and/or kV according to patient size and/or use of iterative reconstruction technique. COMPARISON:  01/29/2023 abdominal radiograph. FINDINGS: Lower chest: Extensive patchy consolidation, centrilobular nodularity and interlobular septal thickening at the left lung base with similar but less prominent findings at the right lung base, new from 03/02/2021 chest CT angiogram study. Hepatobiliary: Normal liver size. No liver mass. Cholecystectomy. No biliary ductal dilatation. Pancreas: Normal, with no mass or duct dilation. Spleen: Normal size. No mass. Adrenals/Urinary Tract: Normal adrenals. No renal stones. No hydronephrosis. No contour deforming renal masses. Normal bladder. Stomach/Bowel: Normal non-distended stomach. Normal caliber small bowel with no small bowel wall thickening. Normal appendix. Normal large bowel with no diverticulosis, large bowel wall thickening or pericolonic fat stranding.  Vascular/Lymphatic: Atherosclerotic nonaneurysmal abdominal aorta. No pathologically enlarged lymph nodes in the abdomen or pelvis. Reproductive: Status post hysterectomy, with no abnormal findings at the vaginal cuff. No adnexal mass. Other: No pneumoperitoneum, ascites or focal fluid collection. Musculoskeletal: No aggressive appearing focal osseous lesions. Marked degenerative disc disease in the lower lumbar spine. IMPRESSION: 1. Extensive patchy consolidation, centrilobular nodularity and interlobular septal thickening at the left lung base with similar but less prominent findings at the right lung base, new from 03/02/2021 chest CT angiogram study. Findings are incompletely evaluated but most suggestive of multilobar bronchopneumonia, potentially due to aspiration. Suggest follow-up chest CT to document resolution. 2. No acute abnormality in the abdomen or pelvis. No evidence of bowel obstruction or acute bowel inflammation. 3.  Aortic Atherosclerosis (ICD10-I70.0). Electronically Signed   By: Ilona Sorrel M.D.   On: 01/30/2023 12:49   DG Abd 1 View  Result Date: 01/29/2023 CLINICAL DATA:  Nausea and vomiting EXAM: ABDOMEN - 1 VIEW COMPARISON:  None Available. FINDINGS: 2 supine frontal views of the abdomen and pelvis are obtained. No bowel obstruction or ileus. No masses or abnormal calcifications. No acute bony abnormality. Patchy consolidation at the left lung base. IMPRESSION: 1. Unremarkable bowel gas pattern. 2. Patchy left basilar consolidation consistent with pneumonia. Electronically Signed   By: Randa Ngo M.D.   On: 01/29/2023 23:15   CT Head Wo Contrast  Result Date: 01/29/2023 CLINICAL DATA:  Fall with head and neck trauma. EXAM: CT HEAD WITHOUT CONTRAST CT CERVICAL SPINE WITHOUT CONTRAST TECHNIQUE: Multidetector CT imaging of the head and cervical spine was performed following the standard protocol without intravenous contrast. Multiplanar CT image reconstructions of the cervical spine  were also generated. RADIATION DOSE REDUCTION: This exam was performed according to the departmental dose-optimization program which includes automated exposure control, adjustment of the mA and/or kV according to patient size and/or use of iterative reconstruction technique. COMPARISON:  CT head 03/02/2021. No prior cervical spine films or cross-sectional imaging. FINDINGS: CT HEAD FINDINGS Brain: No evidence of acute infarction, hemorrhage, hydrocephalus, extra-axial collection or mass lesion/mass effect. There is mild cerebral atrophy and  early small-vessel disease in the cerebral white matter. Cerebellum and brainstem are unremarkable. Vascular: No hyperdense vessel or unexpected calcification. Skull: Negative for fractures or focal lesions. No scalp hematoma is seen. Sinuses/Orbits: No acute orbital findings. Old lens extractions. Nasal septum deviates broadly to the right. There is mild membrane thickening in the left maxillary sinus. Other visualized sinuses, left mastoid air cells are clear. There is chronic fluid in the right mastoid tip. Other: None. CT CERVICAL SPINE FINDINGS Technical note: Very limited image detail below the level of C4. This is due to a combination of patient motion artifact, superimposition of the patient's shoulders causing beam hardening and loss of image detail, and patient habitus. Alignment: Slightly reversed cervical lordosis, minimal levoscoliosis. There is slight anterolisthesis at C5-6 which probably due to discogenic degenerative arthrosis. No traumatic listhesis is suspected. No other listhesis is seen. Narrowing and spurring noted anterior atlantodental joint. 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 As above very limited study for fracture detection below C4. No obvious compression fracture is seen or displaced fractures. No primary pathologic process is grossly suspected. Soft tissues and spinal canal: Spinal canal  contents obscured due to beam hardening and motion below C4. Above C4-5, there is no visible canal hematoma. There is no precervical soft tissue swelling. There is marked fatty infiltration of the parotid glands. There are calcifications in the left-greater-than-right proximal cervical ICAs. No laryngeal or thyroid mass is seen. Disc levels: Not well evaluated. There is moderate disc space loss at C5-6 and C7-T1, otherwise gross preservation of the normal disc heights. There is mild spondylosis. Possible there could be spinal stenosis due to posterior disc osteophyte complexes at C5-6, C6-7 and C7-T1 but this is speculative at best given the degree of obscuration of the spinal canal contents. There are mild facet spurring changes but no significant bony foraminal encroachment. Upper chest: Ill-defined airspace consolidation medial left lung apex. Separate from this there is an 8 mm left apical nodule on 4:82, not seen on the CTA chest from 03/02/2021. Right lung apex is clear. Other: None. IMPRESSION: 1. No acute intracranial CT findings or depressed skull fractures. 2. Mild atrophy and early small-vessel disease. 3. Very limited cervical spine CT due to patient motion, superimposition of the patient's shoulders, and beam hardening. No obvious compression fractures or displaced fractures are seen. 4. 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 Slight cervical kypholevoscoliosis. 5. 8 mm left apical nodule, not seen on the CTA chest from 03/02/2021. Possible this could be an inflammatory nodule given the finding of adjacent airspace disease, but nonspecific. 6. Non-contrast chest CT at 6-12 months is recommended. If the nodule is stable at time of repeat CT, then future CT at 18-24 months (from today's scan) is considered optional for low-risk patients, but is recommended for high-risk patients. This recommendation follows the consensus  statement: Guidelines for Management of Incidental Pulmonary Nodules Detected on CT Images: From the Fleischner Society 2017; Radiology 2017; 284:228-243. 7. Ill-defined airspace consolidation medial left lung apex. Correlate clinically for pneumonia. Follow-up as indicated. 8. Vascular calcifications left-greater-than-right proximal cervical ICAs. Electronically Signed   By: Telford Nab M.D.   On: 01/29/2023 20:59   CT Cervical Spine Wo Contrast  Result Date: 01/29/2023 CLINICAL DATA:  Fall with head and neck trauma. EXAM: CT HEAD WITHOUT CONTRAST CT CERVICAL SPINE WITHOUT CONTRAST TECHNIQUE: Multidetector CT imaging of the head and cervical spine was  performed following the standard protocol without intravenous contrast. Multiplanar CT image reconstructions of the cervical spine were also generated. RADIATION DOSE REDUCTION: This exam was performed according to the departmental dose-optimization program which includes automated exposure control, adjustment of the mA and/or kV according to patient size and/or use of iterative reconstruction technique. COMPARISON:  CT head 03/02/2021. No prior cervical spine films or cross-sectional imaging. FINDINGS: CT HEAD FINDINGS Brain: No evidence of acute infarction, hemorrhage, hydrocephalus, extra-axial collection or mass lesion/mass effect. There is mild cerebral atrophy and early small-vessel disease in the cerebral white matter. Cerebellum and brainstem are unremarkable. Vascular: No hyperdense vessel or unexpected calcification. Skull: Negative for fractures or focal lesions. No scalp hematoma is seen. Sinuses/Orbits: No acute orbital findings. Old lens extractions. Nasal septum deviates broadly to the right. There is mild membrane thickening in the left maxillary sinus. Other visualized sinuses, left mastoid air cells are clear. There is chronic fluid in the right mastoid tip. Other: None. CT CERVICAL SPINE FINDINGS Technical note: Very limited image detail below  the level of C4. This is due to a combination of patient motion artifact, superimposition of the patient's shoulders causing beam hardening and loss of image detail, and patient habitus. Alignment: Slightly reversed cervical lordosis, minimal levoscoliosis. There is slight anterolisthesis at C5-6 which probably due to discogenic degenerative arthrosis. No traumatic listhesis is suspected. No other listhesis is seen. Narrowing and spurring noted anterior atlantodental joint. 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 As above very limited study for fracture detection below C4. No obvious compression fracture is seen or displaced fractures. No primary pathologic process is grossly suspected. Soft tissues and spinal canal: Spinal canal contents obscured due to beam hardening and motion below C4. Above C4-5, there is no visible canal hematoma. There is no precervical soft tissue swelling. There is marked fatty infiltration of the parotid glands. There are calcifications in the left-greater-than-right proximal cervical ICAs. No laryngeal or thyroid mass is seen. Disc levels: Not well evaluated. There is moderate disc space loss at C5-6 and C7-T1, otherwise gross preservation of the normal disc heights. There is mild spondylosis. Possible there could be spinal stenosis due to posterior disc osteophyte complexes at C5-6, C6-7 and C7-T1 but this is speculative at best given the degree of obscuration of the spinal canal contents. There are mild facet spurring changes but no significant bony foraminal encroachment. Upper chest: Ill-defined airspace consolidation medial left lung apex. Separate from this there is an 8 mm left apical nodule on 4:82, not seen on the CTA chest from 03/02/2021. Right lung apex is clear. Other: None. IMPRESSION: 1. No acute intracranial CT findings or depressed skull fractures. 2. Mild atrophy and early small-vessel disease. 3. Very limited  cervical spine CT due to patient motion, superimposition of the patient's shoulders, and beam hardening. No obvious compression fractures or displaced fractures are seen. 4. 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 Slight cervical kypholevoscoliosis. 5. 8 mm left apical nodule, not seen on the CTA chest from 03/02/2021. Possible this could be an inflammatory nodule given the finding of adjacent airspace disease, but nonspecific. 6. Non-contrast chest CT at 6-12 months is recommended. If the nodule is stable at time of repeat CT, then future CT at 18-24 months (from today's scan) is considered optional for low-risk patients, but is recommended for high-risk patients. This recommendation follows the consensus statement: Guidelines for Management of Incidental Pulmonary  Nodules Detected on CT Images: From the Fleischner Society 2017; Radiology 2017; 284:228-243. 7. Ill-defined airspace consolidation medial left lung apex. Correlate clinically for pneumonia. Follow-up as indicated. 8. Vascular calcifications left-greater-than-right proximal cervical ICAs. Electronically Signed   By: Telford Nab M.D.   On: 01/29/2023 20:59   DG Chest Port 1 View  Result Date: 01/29/2023 CLINICAL DATA:  Weakness and shortness of breath. EXAM: PORTABLE CHEST 1 VIEW COMPARISON:  Chest radiograph dated 01/15/2023. FINDINGS: There is mild diffuse chronic interstitial coarsening. Faint area of increased density in the subpleural right upper lobe, may represent chronic changes and sequela prior inflammation/infection. However, this has slightly progressed since the prior radiograph and concerning for recurrent pneumonia. Additional scattered smaller nodular densities improved since the prior radiograph. No pleural effusion or pneumothorax. The cardiac silhouette is within normal limits. No acute osseous pathology. IMPRESSION: Persistent opacity in the subpleural  right upper lobe. Additional scattered nodular densities improved since the prior radiograph. Continued follow-up recommended. Electronically Signed   By: Anner Crete M.D.   On: 01/29/2023 20:42   DG Chest Port 1 View  Result Date: 01/15/2023 CLINICAL DATA:  Questionable sepsis. EXAM: PORTABLE CHEST 1 VIEW COMPARISON:  Chest radiograph and CT dated 03/02/2021. FINDINGS: Scatter pulmonary nodules as seen on the prior CT most consistent with multi lobar pneumonia, possibly atypical in etiology. Clinical correlation and follow-up to resolution recommended. There is slight blunting of the left costophrenic angle which may represent scarring or trace pleural effusion. No pneumothorax. The cardiac silhouette is within normal limits. No acute osseous pathology. IMPRESSION: Multi lobar pneumonia. Clinical correlation and follow-up to resolution recommended. Electronically Signed   By: Anner Crete M.D.   On: 01/15/2023 20:30    Orson Eva, DO  [facility]  If 7PM-7AM, please contact night-coverage [email or web address] Password [email or web address] 02/01/2023, 3:52 PM   LOS: 2 days

## 2023-02-02 DIAGNOSIS — N189 Chronic kidney disease, unspecified: Secondary | ICD-10-CM | POA: Diagnosis not present

## 2023-02-02 DIAGNOSIS — G4733 Obstructive sleep apnea (adult) (pediatric): Secondary | ICD-10-CM | POA: Diagnosis not present

## 2023-02-02 DIAGNOSIS — M6282 Rhabdomyolysis: Secondary | ICD-10-CM | POA: Diagnosis not present

## 2023-02-02 DIAGNOSIS — Z7401 Bed confinement status: Secondary | ICD-10-CM | POA: Diagnosis not present

## 2023-02-02 DIAGNOSIS — E46 Unspecified protein-calorie malnutrition: Secondary | ICD-10-CM | POA: Diagnosis not present

## 2023-02-02 DIAGNOSIS — J9621 Acute and chronic respiratory failure with hypoxia: Secondary | ICD-10-CM | POA: Diagnosis not present

## 2023-02-02 DIAGNOSIS — E039 Hypothyroidism, unspecified: Secondary | ICD-10-CM | POA: Diagnosis not present

## 2023-02-02 DIAGNOSIS — G8929 Other chronic pain: Secondary | ICD-10-CM | POA: Diagnosis not present

## 2023-02-02 DIAGNOSIS — M6281 Muscle weakness (generalized): Secondary | ICD-10-CM | POA: Diagnosis not present

## 2023-02-02 DIAGNOSIS — G629 Polyneuropathy, unspecified: Secondary | ICD-10-CM | POA: Diagnosis not present

## 2023-02-02 DIAGNOSIS — N1832 Chronic kidney disease, stage 3b: Secondary | ICD-10-CM | POA: Diagnosis not present

## 2023-02-02 DIAGNOSIS — R6889 Other general symptoms and signs: Secondary | ICD-10-CM | POA: Diagnosis not present

## 2023-02-02 DIAGNOSIS — J309 Allergic rhinitis, unspecified: Secondary | ICD-10-CM | POA: Diagnosis not present

## 2023-02-02 DIAGNOSIS — K219 Gastro-esophageal reflux disease without esophagitis: Secondary | ICD-10-CM | POA: Diagnosis not present

## 2023-02-02 DIAGNOSIS — R112 Nausea with vomiting, unspecified: Secondary | ICD-10-CM | POA: Diagnosis not present

## 2023-02-02 DIAGNOSIS — R5381 Other malaise: Secondary | ICD-10-CM | POA: Diagnosis not present

## 2023-02-02 DIAGNOSIS — I959 Hypotension, unspecified: Secondary | ICD-10-CM | POA: Diagnosis not present

## 2023-02-02 DIAGNOSIS — J449 Chronic obstructive pulmonary disease, unspecified: Secondary | ICD-10-CM | POA: Diagnosis not present

## 2023-02-02 DIAGNOSIS — R2689 Other abnormalities of gait and mobility: Secondary | ICD-10-CM | POA: Diagnosis not present

## 2023-02-02 DIAGNOSIS — R627 Adult failure to thrive: Secondary | ICD-10-CM | POA: Diagnosis not present

## 2023-02-02 LAB — BASIC METABOLIC PANEL
Anion gap: 9 (ref 5–15)
BUN: 12 mg/dL (ref 6–20)
CO2: 28 mmol/L (ref 22–32)
Calcium: 8.2 mg/dL — ABNORMAL LOW (ref 8.9–10.3)
Chloride: 103 mmol/L (ref 98–111)
Creatinine, Ser: 1.31 mg/dL — ABNORMAL HIGH (ref 0.44–1.00)
GFR, Estimated: 47 mL/min — ABNORMAL LOW (ref >60)
Glucose, Bld: 94 mg/dL (ref 70–99)
Potassium: 3.6 mmol/L (ref 3.5–5.1)
Sodium: 140 mmol/L (ref 135–145)

## 2023-02-02 LAB — MAGNESIUM: Magnesium: 1.9 mg/dL (ref 1.7–2.4)

## 2023-02-02 MED ORDER — ONDANSETRON HCL 8 MG PO TABS: 8.0000 mg | ORAL_TABLET | Freq: Three times a day (TID) | ORAL | 0 refills | Status: DC | PRN

## 2023-02-02 NOTE — TOC Transition Note (Signed)
Transition of Care [facility]) - CM/SW Discharge Note   Patient Details  Name: Krystal Burgess MRN: [id number] Date of Birth: 01/20/1961  Transition of Care [facility] CM/SW Contact:  Boneta Lucks, RN Phone Number: 02/02/2023, 10:51 AM   Clinical Narrative:   Josem Kaufmann Received - 2/12 - 2/14, next review 2/14, Candace Cruise id [id number], plan auth id [id number]. Ebony Hail from [facility] provided room number  310-1, RN will call report. Med necessity printed. TOC will call EMS, when patient is ready. Updated daughter with DC plan .   Final next level of care: Skilled Nursing Facility Barriers to Discharge: Barriers Resolved  Patient Goals and CMS Choice CMS Medicare.gov Compare Post Acute Care list provided to:: Patient Choice offered to / list presented to : Patient, Adult Children  Discharge Placement       Name of family member notified: Nikki Patient and family notified of of transfer: 02/02/23  Discharge Plan and Services Additional resources added to the After Visit Summary for   In-house Referral: Clinical Social Work Discharge Planning Services: CM Consult Post Acute Care Choice: [place] [facility]               Social Determinants of Health (SDOH) Interventions SDOH Screenings   Food Insecurity: No Food Insecurity (01/30/2023)  Housing: Low Risk  (01/30/2023)  Transportation Needs: No Transportation Needs (01/30/2023)  Utilities: Not At Risk (01/30/2023)  Depression (PHQ2-9): High Risk (12/16/2022)  Tobacco Use: Medium Risk (01/30/2023)    Readmission Risk Interventions    01/17/2023    3:21 PM 01/16/2023   10:07 AM  Readmission Risk Prevention Plan  Transportation Screening Complete Complete  HRI or [place]  Complete  Social Work Consult for [place] Planning/Counseling  Complete  Palliative Care Screening  Not Applicable  Medication Review Press photographer)  Complete

## 2023-02-02 NOTE — Progress Notes (Signed)
Report called to [facility] and spoke with Cyril Mourning, nurse. Report given and no further questions at this time. EMS has been called by SW. Awaiting there arrival, patient is aware.

## 2023-02-02 NOTE — Care Management Important Message (Signed)
Important Message  Patient Details  Name: Krystal Burgess MRN: [id number] Date of Birth: 02-21-1961   Medicare Important Message Given:  Yes     Tommy Medal 02/02/2023, 10:51 AM

## 2023-02-03 LAB — CULTURE, BLOOD (ROUTINE X 2)
Culture: NO GROWTH
Culture: NO GROWTH
Special Requests: ADEQUATE

## 2023-02-04 ENCOUNTER — Encounter: Payer: Self-pay | Admitting: Family Medicine

## 2023-02-04 NOTE — Telephone Encounter (Signed)
[place] for virtual - scabies exposure

## 2023-02-05 ENCOUNTER — Telehealth (INDEPENDENT_AMBULATORY_CARE_PROVIDER_SITE_OTHER): Payer: 59 | Admitting: Family Medicine

## 2023-02-05 ENCOUNTER — Other Ambulatory Visit: Payer: Self-pay | Admitting: Family Medicine

## 2023-02-05 ENCOUNTER — Encounter: Payer: Self-pay | Admitting: Family Medicine

## 2023-02-05 VITALS — Ht 64.0 in | Wt 233.0 lb

## 2023-02-05 DIAGNOSIS — E099 Drug or chemical induced diabetes mellitus without complications: Secondary | ICD-10-CM

## 2023-02-05 DIAGNOSIS — E538 Deficiency of other specified B group vitamins: Secondary | ICD-10-CM | POA: Diagnosis not present

## 2023-02-05 DIAGNOSIS — R911 Solitary pulmonary nodule: Secondary | ICD-10-CM

## 2023-02-05 DIAGNOSIS — E611 Iron deficiency: Secondary | ICD-10-CM

## 2023-02-05 DIAGNOSIS — J189 Pneumonia, unspecified organism: Secondary | ICD-10-CM

## 2023-02-05 DIAGNOSIS — L299 Pruritus, unspecified: Secondary | ICD-10-CM

## 2023-02-05 DIAGNOSIS — T380X5D Adverse effect of glucocorticoids and synthetic analogues, subsequent encounter: Secondary | ICD-10-CM

## 2023-02-05 DIAGNOSIS — N1832 Chronic kidney disease, stage 3b: Secondary | ICD-10-CM | POA: Diagnosis not present

## 2023-02-05 DIAGNOSIS — I10 Essential (primary) hypertension: Secondary | ICD-10-CM

## 2023-02-05 DIAGNOSIS — G629 Polyneuropathy, unspecified: Secondary | ICD-10-CM

## 2023-02-05 MED ORDER — ALPRAZOLAM 0.5 MG PO TABS: 0.5000 mg | ORAL_TABLET | Freq: Two times a day (BID) | ORAL | 0 refills | Status: DC | PRN

## 2023-02-05 NOTE — Telephone Encounter (Signed)
01/01/2023 01/01/2023 1 Alprazolam 1 Mg Tablet 60.00 30 Ca Met [id number] Nor ([id number]) 0/0 4.00 LME Medicare [place]     02/02/2023 02/02/2023 2 Gabapentin 600 Mg Tablet 60.00 30 Jo Bla [id number] Pha ([id number]) 0/99 1.34 LME Private Pay

## 2023-02-05 NOTE — Progress Notes (Signed)
Virtual Visit via Video Note  I connected with Krystal Burgess on 02/05/23 at 11:30 AM EST by a video enabled telemedicine application and verified that I am speaking with the correct person using two identifiers.   I discussed the limitations of evaluation and management by telemedicine and the availability of in person appointments. The patient expressed understanding and agreed to proceed.  Patient location: at home Provider location: in office  Subjective:    CC:   Chief Complaint  Patient presents with   scabies    Itchy for 3 days    HPI: Here today for here today for virtual visit after recent hospitalization.  She is actually been to the hospital and been admitted 3 times since December.  This last time she just felt so extremely weak that she kept falling one of the episodes she actually hit her head and her neck.  She says she still having some tenderness and soreness because of that.  They admitted her for severe dehydration.  Prior to that she had actually been admitted about 2 weeks before for pneumonia at [facility].  She just thinks that she still was not feeling well when she went home and just did not eat and the more she did not eat the more her stomach felt nauseated to the point that she then became severely dehydrated and weak.  Her she was released this time she was sent to [facility] rehab.  And she said that the woman that was also in the room with her reported that she had scabies.  She says she tried not to touch anything that belonged to the other patient while she was there she has had some generalized itching on the skin but nothing specific and no discrete rash at this point.  They were supposed to do some physical therapy while she was there and she said they basically just held up a phone and then had the person on her and work with her even though they were not a trained physical therapist.  He is back home now and just trying to get her strength back  she has been trying to drink a lot of fluids and stay hydrated her appetite is improving but not quite back to baseline.  Her blood pressure was quite low and they started her on midodrine so she has been holding her amlodipine and her losartan.  She wants to know if she should restart her blood pressure medications.  Blood pressure was improving before she left the hospital.  I do not have any records from the rehab confirming her blood pressure.  Also held her Crestor until she was feeling better and eating normally because of an increased LFTs and CK.  Also wanted [place] to follow-up on the lung nodule that was seen.  Did check her labs including magnesium which was normal, folate which was normal, B12 which was normal which is fantastic considering how low it was a year ago.   Past medical history, Surgical history, Family history not pertinant except as noted below, Social history, Allergies, and medications have been entered into the medical record, reviewed, and corrections made.    Objective:    General: Speaking clearly in complete sentences without any shortness of breath.  Alert and oriented x3.  Normal judgment. No apparent acute distress.    Impression and Recommendations:    Problem List Items Addressed This Visit       Cardiovascular and Mediastinum   Essential hypertension  Right now she does not have a blood pressure cuff so it is a little difficult to determine whether or not she would benefit from restarting her medications.  Encouraged her to try to get 1 or more 1 if at all possible since she cannot come in for an in person visit right now.  We can try holding the midodrine she will just have to see how she feels if she starts feeling weak or lightheaded and she may need to restart it.  It does sound like she is hydrating much better which is reassuring.      Relevant Orders   BASIC METABOLIC PANEL WITH GFR   CBC with Differential/Platelet     Endocrine    Steroid-induced diabetes ([place])    A1c in the hospital was down to 5.5.  But she is also been ill for several months with poor p.o. intake.  So we will still need to plan to recheck this again in 6 months.        Genitourinary   Stage 3b chronic kidney disease (CKD) ([place])    Recheck renal function encouraged her to try to go to get labs in the next week.        Other   Vitamin B 12 deficiency   Relevant Orders   B12   Other Visit Diagnoses     Itching    -  Primary   Pneumonia of both lungs due to infectious organism, unspecified part of lung       Relevant Orders   CT Chest Wo Contrast   Nodule of upper lobe of left lung       Relevant Orders   CT Chest Wo Contrast       Possible exposure to scabies-we did discuss what to look for in regards to rash.  I even showed her some pictures so that she could have up sense of what she was looking for she is also worried about her grandbaby that came to visit her while she was there so again just encouraged her to keep an eye out on his skin for any changes or rash.  Needs repeat CT to make sure that the pneumonia has completely resolved and to follow-up on the lung nodule that was seen on the CT of her neck on the left side.  Orders Placed This Encounter  Procedures   CT Chest Wo Contrast    Standing Status:   Future    Standing Expiration Date:   02/06/2024    Order Specific Question:   Is patient pregnant?    Answer:   No    Order Specific Question:   Preferred imaging location?    Answer:   [facility]   BASIC METABOLIC PANEL WITH GFR   CBC with Differential/Platelet   B12    No orders of the defined types were placed in this encounter.    I discussed the assessment and treatment plan with the patient. The patient was provided an opportunity to ask questions and all were answered. The patient agreed with the plan and demonstrated an understanding of the instructions.   The patient was advised to call back or seek  an in-person evaluation if the symptoms worsen or if the condition fails to improve as anticipated.   Beatrice Lecher, MD

## 2023-02-05 NOTE — Assessment & Plan Note (Signed)
Right now she does not have a blood pressure cuff so it is a little difficult to determine whether or not she would benefit from restarting her medications.  Encouraged her to try to get 1 or more 1 if at all possible since she cannot come in for an in person visit right now.  We can try holding the midodrine she will just have to see how she feels if she starts feeling weak or lightheaded and she may need to restart it.  It does sound like she is hydrating much better which is reassuring.

## 2023-02-05 NOTE — Assessment & Plan Note (Signed)
A1c in the hospital was down to 5.5.  But she is also been ill for several months with poor p.o. intake.  So we will still need to plan to recheck this again in 6 months.

## 2023-02-05 NOTE — Assessment & Plan Note (Signed)
Recheck renal function encouraged her to try to go to get labs in the next week.

## 2023-02-06 ENCOUNTER — Encounter: Payer: Self-pay | Admitting: Family Medicine

## 2023-02-06 MED ORDER — DICLOFENAC SODIUM 75 MG PO TBEC: 75.0000 mg | DELAYED_RELEASE_TABLET | Freq: Two times a day (BID) | ORAL | 3 refills | Status: DC

## 2023-02-06 MED ORDER — OXYCODONE HCL 10 MG PO TABS: 10.0000 mg | ORAL_TABLET | Freq: Three times a day (TID) | ORAL | 0 refills | Status: DC

## 2023-02-06 NOTE — Telephone Encounter (Signed)
Ok, sounds good  Meds ordered this encounter  Medications   diclofenac (VOLTAREN) 75 MG EC tablet    Sig: Take 1 tablet (75 mg total) by mouth 2 (two) times daily.    Dispense:  60 tablet    Refill:  3   Oxycodone HCl 10 MG TABS    Sig: Take 1 tablet (10 mg total) by mouth every 8 (eight) hours.    Dispense:  90 tablet    Refill:  0   I believe there were 5 other sent yesterday

## 2023-02-10 DIAGNOSIS — J449 Chronic obstructive pulmonary disease, unspecified: Secondary | ICD-10-CM | POA: Diagnosis not present

## 2023-02-10 DIAGNOSIS — J441 Chronic obstructive pulmonary disease with (acute) exacerbation: Secondary | ICD-10-CM | POA: Diagnosis not present

## 2023-02-12 ENCOUNTER — Encounter: Payer: Self-pay | Admitting: Family Medicine

## 2023-02-13 NOTE — Telephone Encounter (Signed)
Yes, recommend Prevnar 20.  She should definitely get updated.  Okay to take Alli, over-the-counter weight loss med

## 2023-02-17 ENCOUNTER — Telehealth (INDEPENDENT_AMBULATORY_CARE_PROVIDER_SITE_OTHER): Payer: 59 | Admitting: Family Medicine

## 2023-02-17 ENCOUNTER — Encounter: Payer: Self-pay | Admitting: Family Medicine

## 2023-02-17 DIAGNOSIS — E039 Hypothyroidism, unspecified: Secondary | ICD-10-CM

## 2023-02-17 DIAGNOSIS — J441 Chronic obstructive pulmonary disease with (acute) exacerbation: Secondary | ICD-10-CM | POA: Diagnosis not present

## 2023-02-17 MED ORDER — PREDNISONE 20 MG PO TABS: ORAL_TABLET | ORAL | 0 refills | Status: DC

## 2023-02-17 MED ORDER — LEVOTHYROXINE SODIUM 88 MCG PO TABS: 88.0000 ug | ORAL_TABLET | Freq: Every day | ORAL | 1 refills | Status: DC

## 2023-02-17 MED ORDER — CEFDINIR 300 MG PO CAPS: 300.0000 mg | ORAL_CAPSULE | Freq: Two times a day (BID) | ORAL | 0 refills | Status: DC

## 2023-02-17 NOTE — Progress Notes (Signed)
Virtual Visit via Video Note  I connected with Krystal Burgess on 02/17/23 at  1:00 PM EST by a video enabled telemedicine application and verified that I am speaking with the correct person using two identifiers.   I discussed the limitations of evaluation and management by telemedicine and the availability of in person appointments. The patient expressed understanding and agreed to proceed.  Patient location: at home Provider location: in office  Subjective:    CC:   Chief Complaint  Patient presents with   Sore Throat   Cough    HPI: She says she started to feel bad a couple of days ago.  Her daughter was sick initially and she is pretty sure that is where she got it because she stays in the house.  Her throat has been scratchy and itchy her right ears been hurting she has had a lot of facial pain and pressure.  She started coughing this morning getting up green mucus she is also been more short of breath today she says she is already done 2 nebulizer treatments this morning starting around 9 AM.  She denies any GI symptoms or rash.  She has had a little bit of swelling in her hands but thinks it is unrelated.  Did check her TSH while she was hospitalized it was 7.3 on January 28.  She does need refills.  Past medical history, Surgical history, Family history not pertinant except as noted below, Social history, Allergies, and medications have been entered into the medical record, reviewed, and corrections made.    Objective:    General: Speaking clearly in complete sentences without any shortness of breath.  Alert and oriented x3.  Normal judgment. No apparent acute distress.    Impression and Recommendations:    Problem List Items Addressed This Visit       Respiratory   COPD exacerbation ([place]) - Primary   Relevant Medications   predniSONE (DELTASONE) 5 MG tablet   predniSONE (DELTASONE) 20 MG tablet   cefdinir (OMNICEF) 300 MG capsule     Endocrine    Hypothyroidism   Relevant Medications   levothyroxine (SYNTHROID) 88 MCG tablet   COPD exacerbation-will treat with Omnicef and prednisone.  Continue with nebulizer treatments.  If she is not improving please let us know.  She just got over her recent hospitalization for septic shock and acute respiratory failure.  I did encourage her to test for COVID as well because if she is positive then she be a good candidate for antiviral treatment especially if we can started early.  Also requested a refill on her thyroid medication. Lab Results  Component Value Date   TSH 7.334 (H) 01/18/2023      No orders of the defined types were placed in this encounter.   Meds ordered this encounter  Medications   levothyroxine (SYNTHROID) 88 MCG tablet    Sig: Take 1 tablet (88 mcg total) by mouth daily.    Dispense:  90 tablet    Refill:  1   predniSONE (DELTASONE) 20 MG tablet    Sig: Take 2 tablets (40 mg total) by mouth daily with breakfast for 5 days, THEN 1 tablet (20 mg total) daily with breakfast for 5 days.    Dispense:  10 tablet    Refill:  0   cefdinir (OMNICEF) 300 MG capsule    Sig: Take 1 capsule (300 mg total) by mouth 2 (two) times daily.    Dispense:  20 capsule  Refill:  0    I discussed the assessment and treatment plan with the patient. The patient was provided an opportunity to ask questions and all were answered. The patient agreed with the plan and demonstrated an understanding of the instructions.   The patient was advised to call back or seek an in-person evaluation if the symptoms worsen or if the condition fails to improve as anticipated.   Beatrice Lecher, MD

## 2023-02-17 NOTE — Progress Notes (Signed)
Pt reports that her sxs started 1 day ago. This morning her sxs got worse. Throat sore, swollen glands. Right ear pain, coughing up green mucous.   Pt hasn't taken a Covid test. She stated that her daughter actually had some of the same sxs and believes that she got this from her.   She has been taking sudafed, and some throat lozenges.

## 2023-02-18 ENCOUNTER — Other Ambulatory Visit: Payer: Self-pay | Admitting: *Deleted

## 2023-02-18 DIAGNOSIS — J441 Chronic obstructive pulmonary disease with (acute) exacerbation: Secondary | ICD-10-CM

## 2023-02-18 MED ORDER — PREDNISONE 20 MG PO TABS: ORAL_TABLET | ORAL | 0 refills | Status: AC

## 2023-02-18 NOTE — Telephone Encounter (Signed)
See other MyChart message

## 2023-02-20 ENCOUNTER — Encounter: Payer: Self-pay | Admitting: Family Medicine

## 2023-02-20 DIAGNOSIS — J189 Pneumonia, unspecified organism: Secondary | ICD-10-CM

## 2023-02-20 DIAGNOSIS — R911 Solitary pulmonary nodule: Secondary | ICD-10-CM

## 2023-02-20 NOTE — Telephone Encounter (Signed)
New order placed for [facility] location but will likely need prior authorization updated.

## 2023-02-24 NOTE — Telephone Encounter (Signed)
Task completed. Imaging dept has been updated to contact patient for scheduling.

## 2023-02-24 NOTE — Telephone Encounter (Signed)
I will contact the insurance to see if a new auth is required for change of location for imaging study.

## 2023-02-25 ENCOUNTER — Encounter: Payer: Self-pay | Admitting: Family Medicine

## 2023-02-25 ENCOUNTER — Ambulatory Visit: Payer: Self-pay

## 2023-02-25 NOTE — Patient Instructions (Signed)
Visit Information  Thank you for taking time to visit with me today. Please don't hesitate to contact me if I can be of assistance to you.   Following are the goals we discussed today:   Goals Addressed             This Visit's Progress    Care Coordination Activities-continue to improove post hospitalization       Interventions Today    Flowsheet Row Most Recent Value  Chronic Disease   Chronic disease during today's visit Chronic Obstructive Pulmonary Disease (COPD)  General Interventions   General Interventions Discussed/Reviewed General Interventions Discussed, Durable Medical Equipment (DME), Doctor Visits  [facility] contact number provided and encouraged to call with care coordination needs as needed. discussed the improtance of completing designated party release form at next provider visit. [name] reprots it is ok to speak with her daughter, Lexine Baton Tomson.]  Doctor Visits Discussed/Reviewed Doctor Visits Discussed  [reviewed upcoming scheduled appointments]  Museum/gallery conservator (DME) Oxygen, Gilford Rile, Other  [pulse oximeter]  Exercise Interventions   Exercise Discussed/Reviewed Physical Activity  Physical Activity Discussed/Reviewed Physical Activity Discussed  [encouraged to remain as active as tolerated.]  Education Interventions   Education Provided Provided Education  Provided Verbal Education On When to see the doctor, Nutrition  [encouraged small frequent meals, discussed cod action plan and when to call the doctor]  Nutrition Interventions   Nutrition Discussed/Reviewed Nutrition Discussed  [encouraged to eat healthy, increase protein to promote healing, patient reports she has premier protein and boost-encouraged to drink as recommended.]  Pharmacy Interventions   Pharmacy Dicussed/Reviewed Pharmacy Topics Discussed, Affording Medications, Medication Adherence, Medications and their functions            Our next appointment is by telephone on 03/09/23 at 2:30  pm  Please call the care guide team at [phone] if you need to cancel or reschedule your appointment.   If you are experiencing a Mental Health or [place] or need someone to talk to, please call the Suicide and Crisis Lifeline: [place], RN, MSN, BSN, [place] [phone]

## 2023-02-25 NOTE — Patient Outreach (Signed)
  Care Coordination   Initial Visit Note   02/25/2023 Name: Krystal Burgess MRN: [id number] DOB: 1961-03-07  Krystal Burgess is a 62 y.o. year old female who sees Metheney, Rene Kocher, MD for primary care. I spoke with  Marvia Pickles by phone today.  What matters to the patients health and wellness today?  [name] states she is not back to her normal. She states some days she feels better than other. She reports she is communicating with primary care provider via mychart as needed. Patient lives with daughter and states supportive family. [name] has O2 at 2l/Krystal Burgess resting and 4L/Krystal Burgess with activity. She reports poor apetite and states she will attempt to increase food intake and drink supplements previously recommended. She reports she has an appointment with her psychiatrist/neurologist Dr. Trula Ore on 03/10/23.   Goals Addressed             This Visit's Progress    Care Coordination Activities-continue to improove post hospitalization       Interventions Today    Flowsheet Row Most Recent Value  Chronic Disease   Chronic disease during today's visit Chronic Obstructive Pulmonary Disease (COPD)  General Interventions   General Interventions Discussed/Reviewed General Interventions Discussed, Durable Medical Equipment (DME), Doctor Visits  [facility] contact number provided and encouraged to call with care coordination needs as needed. discussed the improtance of completing designated party release form at next provider visit. [name] reprots it is ok to speak with her daughter, Krystal Burgess.]  Doctor Visits Discussed/Reviewed Doctor Visits Discussed  [reviewed upcoming scheduled appointments]  Durable Medical Equipment (DME) Oxygen, Murray Hodgkins  [pulse oximeter]  Exercise Interventions   Exercise Discussed/Reviewed Physical Activity  Physical Activity Discussed/Reviewed Physical Activity Discussed  [encouraged to remain as active as tolerated.]  Education Interventions   Education  Provided Provided Education  Provided Verbal Education On When to see the doctor, Nutrition  [encouraged small frequent meals, discussed cod action plan and when to call the doctor]  Nutrition Interventions   Nutrition Discussed/Reviewed Nutrition Discussed  [encouraged to eat healthy, increase protein to promote healing, patient reports she has premier protein and boost-encouraged to drink as recommended.]  Pharmacy Interventions   Pharmacy Dicussed/Reviewed Pharmacy Topics Discussed, Affording Medications, Medication Adherence, Medications and their functions            SDOH assessments and interventions completed:  No  Care Coordination Interventions:  Yes, provided   Follow up plan: Follow up call scheduled for 03/09/23    Encounter Outcome:  Pt. Visit Completed   Thea Silversmith, RN, MSN, BSN, CCM Care Management Coordinator [place] [facility] [phone]

## 2023-02-26 NOTE — Telephone Encounter (Signed)
Requesting rx rf of alprazolam   Please note patient message taking more than prescribed due to increased anxiety . Last written 2/15/204 Last OV 02/17/2023 video visit Upcoming appt schld 03/28/2023

## 2023-03-02 ENCOUNTER — Telehealth: Payer: Self-pay

## 2023-03-02 DIAGNOSIS — Z0182 Encounter for allergy testing: Secondary | ICD-10-CM

## 2023-03-02 DIAGNOSIS — J301 Allergic rhinitis due to pollen: Secondary | ICD-10-CM

## 2023-03-02 DIAGNOSIS — J441 Chronic obstructive pulmonary disease with (acute) exacerbation: Secondary | ICD-10-CM

## 2023-03-02 MED ORDER — ALPRAZOLAM 0.5 MG PO TABS: 0.5000 mg | ORAL_TABLET | Freq: Two times a day (BID) | ORAL | 0 refills | Status: DC | PRN

## 2023-03-02 NOTE — Telephone Encounter (Signed)
Medication was refilled this morning but she absolutely cannot double up.  We have already discussed the safety issues with that and her chronic pain meds and I am really trying to actually wean her off but I did go ahead and refill it today.  Okay to place referrals.

## 2023-03-02 NOTE — Telephone Encounter (Signed)
Medication has been addressed but request for referrals not showing in patient chart. Pended referrals .   Krystal Burgess "Krystal Burgess"  to [facility] (supporting Hali Marry, MD)      02/26/23 12:04 PM Hi Krystal Burgess,  I really need my alprazalam filled. I have had to double them at night and taking only 0.05 during the day. I'm struggling bad with anxiety & panic attacks.   Yes, I need to have allergy testing done again to get back on my shots. I also need to see a lung dr. Marina Gravel. Teresa Coombs, Theo Dills, CMA  to Krystal Burgess "Krystal Burgess"      02/26/23  8:55 AM Krystal Burgess,   Which medication are you requesting? Also, are you wanting to go the Ear Nose and Throat specialist for allergy testing?   Take care, Krystal Burgess  Last read by Krystal Burgess "Krystal Burgess" at 12:00 PM on 02/26/2023. February 25, 2023 Krystal Burgess "Krystal Burgess"  to [facility] (supporting Hali Marry, MD)      02/25/23  5:28 PM Dr. Jerilynn Mages,  I need referrals for a lung Dr & for allergy tests at [facility].  I know you do not want to refill then, but I need my nerve pills filled, I've had to double them to be able to sleep. I'm having bad panic attacks during the day. I will be seeing Dr Trula Ore on the 19th and will have him to start filling all of that again. Thanks, Krystal Burgess

## 2023-03-02 NOTE — Telephone Encounter (Signed)
Referrals sent

## 2023-03-03 ENCOUNTER — Other Ambulatory Visit: Payer: 59

## 2023-03-03 NOTE — Telephone Encounter (Signed)
Attempted call to patient . Mail box full could not leave a voice mail message.

## 2023-03-04 NOTE — Telephone Encounter (Signed)
Patient informed of referrals and instructed to absolutely NOT double up the medication . Patient voiced understanding of safety issues regarding this and will not double medication in future.

## 2023-03-05 ENCOUNTER — Other Ambulatory Visit: Payer: Self-pay | Admitting: Family Medicine

## 2023-03-09 ENCOUNTER — Telehealth: Payer: Self-pay

## 2023-03-09 NOTE — Patient Outreach (Signed)
  Care Coordination   03/09/2023 Name: Krystal Burgess MRN: [id number] DOB: 1961-03-22   Care Coordination Outreach Attempts:  An unsuccessful telephone outreach was attempted today to offer the patient information about available care coordination services as a benefit of their health plan.   Follow Up Plan:  Additional outreach attempts will be made to offer the patient care coordination information and services.   Encounter Outcome:  No Answer   Care Coordination Interventions:  No, not indicated    Thea Silversmith, RN, MSN, BSN, [place] Coordinator [phone]

## 2023-03-10 ENCOUNTER — Other Ambulatory Visit: Payer: 59

## 2023-03-11 ENCOUNTER — Telehealth: Payer: Self-pay | Admitting: *Deleted

## 2023-03-11 ENCOUNTER — Other Ambulatory Visit: Payer: 59

## 2023-03-11 DIAGNOSIS — J449 Chronic obstructive pulmonary disease, unspecified: Secondary | ICD-10-CM | POA: Diagnosis not present

## 2023-03-11 DIAGNOSIS — J441 Chronic obstructive pulmonary disease with (acute) exacerbation: Secondary | ICD-10-CM | POA: Diagnosis not present

## 2023-03-11 NOTE — Progress Notes (Signed)
  Care Coordination Note  03/11/2023 Name: QUADASIA DISBRO MRN: [id number] DOB: Aug 23, 1961  JOBYNA WEIMAR is a 62 y.o. year old female who is a primary care patient of Hali Marry, MD and is actively engaged with the care management team. I reached out to Marvia Pickles by phone today to assist with re-scheduling a follow up visit with the RN Case Manager  Follow up plan: Unsuccessful telephone outreach attempt made. A HIPAA compliant phone message was left for the patient providing contact information and requesting a return call.   Julian Hy, [place] Direct Dial: [phone]

## 2023-03-13 ENCOUNTER — Telehealth: Payer: 59 | Admitting: Physician Assistant

## 2023-03-13 ENCOUNTER — Telehealth (INDEPENDENT_AMBULATORY_CARE_PROVIDER_SITE_OTHER): Payer: 59 | Admitting: Family Medicine

## 2023-03-13 ENCOUNTER — Telehealth: Payer: Self-pay | Admitting: Family Medicine

## 2023-03-13 VITALS — Ht 64.0 in | Wt 233.0 lb

## 2023-03-13 DIAGNOSIS — R29898 Other symptoms and signs involving the musculoskeletal system: Secondary | ICD-10-CM | POA: Diagnosis not present

## 2023-03-13 DIAGNOSIS — J329 Chronic sinusitis, unspecified: Secondary | ICD-10-CM | POA: Diagnosis not present

## 2023-03-13 DIAGNOSIS — M545 Low back pain, unspecified: Secondary | ICD-10-CM

## 2023-03-13 DIAGNOSIS — R2681 Unsteadiness on feet: Secondary | ICD-10-CM | POA: Diagnosis not present

## 2023-03-13 DIAGNOSIS — J40 Bronchitis, not specified as acute or chronic: Secondary | ICD-10-CM | POA: Diagnosis not present

## 2023-03-13 DIAGNOSIS — G8929 Other chronic pain: Secondary | ICD-10-CM

## 2023-03-13 DIAGNOSIS — R221 Localized swelling, mass and lump, neck: Secondary | ICD-10-CM | POA: Diagnosis not present

## 2023-03-13 MED ORDER — AMBULATORY NON FORMULARY MEDICATION: 0 refills | Status: DC

## 2023-03-13 MED ORDER — PREDNISONE 20 MG PO TABS: 40.0000 mg | ORAL_TABLET | Freq: Every day | ORAL | 0 refills | Status: DC

## 2023-03-13 MED ORDER — AZITHROMYCIN 250 MG PO TABS: ORAL_TABLET | ORAL | 0 refills | Status: AC

## 2023-03-13 MED ORDER — MONTELUKAST SODIUM 10 MG PO TABS: 10.0000 mg | ORAL_TABLET | Freq: Every day | ORAL | 3 refills | Status: DC

## 2023-03-13 NOTE — Telephone Encounter (Signed)
Call and check on allergy referral.  She did hear from the pulmonologist but not the allergist.  That referrals were placed on the same day.  Thank you.

## 2023-03-13 NOTE — Progress Notes (Signed)
Virtual Visit via Video Note  I connected with Krystal Burgess on 03/13/23 at  3:20 PM EDT by a video enabled telemedicine application and verified that I am speaking with the correct person using two identifiers.   I discussed the limitations of evaluation and management by telemedicine and the availability of in person appointments. The patient expressed understanding and agreed to proceed.  Patient location: at home Provider location: in office  Subjective:    CC:  No chief complaint on file.   HPI: Starting about 2 days ago she started getting more sore throat drainage congestion and cough that really ramped up.  She feels like she is getting a lot of thick phlegm that coats her throat.  And she will cough and cough and cough and then finally feel like she can move the drainage.  She has had significant pain in her right ear and right side of her throat as well.  She says that she still has a lump on the right side of her neck she had mentioned it to me previously at the time I could not palpate it and had encouraged her to keep an eye on it but she says it still there and now she feels like some of that pain is radiating up towards that right ear.  She does get some occasional popping of the TMJ on that side.   She is living with her daughter who also has 7 cats and she feels like that is really flaring up her allergies.  With the tree pollen increase in the last few weeks.  She is on chronic oxygen therapy.  She was finally able to get in with the pulmonologist and has an upcoming appointment but says she has not heard back from the allergist   Past medical history, Surgical history, Family history not pertinant except as noted below, Social history, Allergies, and medications have been entered into the medical record, reviewed, and corrections made.    Objective:    General: Speaking clearly in complete sentences without any shortness of breath.  Alert and oriented x3.   Normal judgment. No apparent acute distress.    Impression and Recommendations:    Problem List Items Addressed This Visit       Other   Chronic back pain   Relevant Medications   predniSONE (DELTASONE) 20 MG tablet   AMBULATORY NON FORMULARY MEDICATION   AMBULATORY NON FORMULARY MEDICATION   AMBULATORY NON FORMULARY MEDICATION   Other Visit Diagnoses     Neck mass    -  Primary   Relevant Orders   US Soft Tissue Head/Neck (NON-THYROID)   Sinobronchitis       Relevant Medications   azithromycin (ZITHROMAX) 250 MG tablet   predniSONE (DELTASONE) 20 MG tablet   Gait instability       Relevant Medications   AMBULATORY NON FORMULARY MEDICATION   AMBULATORY NON FORMULARY MEDICATION   AMBULATORY NON FORMULARY MEDICATION   Weakness of both lower extremities       Relevant Medications   AMBULATORY NON FORMULARY MEDICATION   AMBULATORY NON FORMULARY MEDICATION   AMBULATORY NON FORMULARY MEDICATION       Sinobronchitis with COPD exacerbation-we will go ahead and treat with azithromycin and prednisone.  We last treated her exacerbation about 4 weeks ago with Omnicef.  I am can also add Singulair she said she took it years ago.  I am hoping this might help with some of the cat allergies will  still call and figure out why she has not heard back from [facility].  I am glad that she does have an upcoming pulmonary appointment.  Neck mass on the right side-we will go ahead and get an dedicated ultrasound.  Will go from there if we need additional imaging.  Asked if she could have a prescription for medically necessary adjustable bed, treadmill and walk in tub with jets.  She did check with the insurance and they said they would help pay for those as long as she had a prescription.   Orders Placed This Encounter  Procedures   US Soft Tissue Head/Neck (NON-THYROID)    Standing Status:   Future    Standing Expiration Date:   03/12/2024    Order Specific Question:    Reason for Exam (SYMPTOM  OR DIAGNOSIS REQUIRED)    Answer:   lump on right side of neck, present for months, more pain and tenderness lately    Order Specific Question:   Preferred imaging location?    Answer:   [facility]    Meds ordered this encounter  Medications   azithromycin (ZITHROMAX) 250 MG tablet    Sig: 2 Ttabs PO on Day 1, then one a day x 4 days.    Dispense:  6 tablet    Refill:  0   predniSONE (DELTASONE) 20 MG tablet    Sig: Take 2 tablets (40 mg total) by mouth daily with breakfast.    Dispense:  10 tablet    Refill:  0   montelukast (SINGULAIR) 10 MG tablet    Sig: Take 1 tablet (10 mg total) by mouth at bedtime.    Dispense:  30 tablet    Refill:  3   AMBULATORY NON FORMULARY MEDICATION    Sig: Medication Name: Home treadmill to improve lower extremity strength.  Diagnosis :  gait instability and lower extremity weakness, morbid obesity.    Dispense:  1 Units    Refill:  0   AMBULATORY NON FORMULARY MEDICATION    Sig: Medication Name: Walk in tub with jets.  Diagnosis: Osteoarthritis, myalgias.    Dispense:  1 Units    Refill:  0   AMBULATORY NON FORMULARY MEDICATION    Sig: Medication Name: Adjustable bed.  Diagnosis: Spinal stenosis, osteoarthritis, diabetes, lower extremity weakness, gait instability.    Dispense:  1 Units    Refill:  0     I discussed the assessment and treatment plan with the patient. The patient was provided an opportunity to ask questions and all were answered. The patient agreed with the plan and demonstrated an understanding of the instructions.   The patient was advised to call back or seek an in-person evaluation if the symptoms worsen or if the condition fails to improve as anticipated.   Beatrice Lecher, MD

## 2023-03-13 NOTE — Progress Notes (Signed)
Symptoms started 1-2 days ago Ear pain Sore throat Congestion Cough (thick mucous) Body aches  Tried - sudafed  No fevers/chills   **wants to discuss getting prescriptions for these other things that her insurance told her they would pay for - full size adjustable mattress/bed - walk in tub (for back/legs) - treadmill

## 2023-03-15 ENCOUNTER — Other Ambulatory Visit: Payer: Self-pay | Admitting: Family Medicine

## 2023-03-15 DIAGNOSIS — J4489 Other specified chronic obstructive pulmonary disease: Secondary | ICD-10-CM

## 2023-03-15 DIAGNOSIS — J329 Chronic sinusitis, unspecified: Secondary | ICD-10-CM

## 2023-03-16 ENCOUNTER — Encounter: Payer: Self-pay | Admitting: Family Medicine

## 2023-03-16 ENCOUNTER — Ambulatory Visit (HOSPITAL_COMMUNITY): Payer: 59

## 2023-03-16 ENCOUNTER — Telehealth: Payer: 59 | Admitting: Family Medicine

## 2023-03-16 NOTE — Telephone Encounter (Signed)
Contacted patient and gave patient the following below. Patient states she will contact [facility] to schedule an appointment.      Phone call and mychart message was sent to patient on 03/10/2023 regarding Allergy Referral.   [facility] [place],  [id number] [phone]  [phone]

## 2023-03-16 NOTE — Telephone Encounter (Signed)
Okay, we can just reorder and update the prescription for the bed and fax it to the number that she sent.

## 2023-03-17 ENCOUNTER — Other Ambulatory Visit: Payer: 59

## 2023-03-19 ENCOUNTER — Other Ambulatory Visit: Payer: Self-pay | Admitting: *Deleted

## 2023-03-19 ENCOUNTER — Other Ambulatory Visit: Payer: Self-pay | Admitting: Family Medicine

## 2023-03-19 DIAGNOSIS — R2681 Unsteadiness on feet: Secondary | ICD-10-CM

## 2023-03-19 DIAGNOSIS — M545 Low back pain, unspecified: Secondary | ICD-10-CM

## 2023-03-19 DIAGNOSIS — M542 Cervicalgia: Secondary | ICD-10-CM | POA: Diagnosis not present

## 2023-03-19 DIAGNOSIS — Z79899 Other long term (current) drug therapy: Secondary | ICD-10-CM | POA: Diagnosis not present

## 2023-03-19 DIAGNOSIS — R29898 Other symptoms and signs involving the musculoskeletal system: Secondary | ICD-10-CM

## 2023-03-19 MED ORDER — AMBULATORY NON FORMULARY MEDICATION: 0 refills | Status: DC

## 2023-03-23 ENCOUNTER — Other Ambulatory Visit: Payer: 59

## 2023-03-26 ENCOUNTER — Other Ambulatory Visit: Payer: 59

## 2023-03-30 ENCOUNTER — Institutional Professional Consult (permissible substitution): Payer: 59 | Admitting: Internal Medicine

## 2023-03-31 ENCOUNTER — Other Ambulatory Visit: Payer: 59

## 2023-04-01 ENCOUNTER — Other Ambulatory Visit: Payer: Self-pay | Admitting: Family Medicine

## 2023-04-01 NOTE — Progress Notes (Signed)
  Care Coordination Note  04/01/2023 Name: Krystal Burgess MRN: [id number] DOB: 25-Jan-1961  Krystal Burgess is a 62 y.o. year old female who is a primary care patient of Agapito Games, MD and is actively engaged with the care management team. I reached out to [name] by phone today to assist with re-scheduling a follow up visit with the RN Case Manager  Follow up plan: We have been unable to make contact with the patient for follow up.   Burman Nieves, CCMA Care Coordination Care Guide Direct Dial: [phone]

## 2023-04-02 NOTE — Progress Notes (Signed)
  Care Coordination Note  04/02/2023 Name: RAHIL OGNIBENE MRN: [id number] DOB: 1961/12/13  RATASHA FICKEN is a 62 y.o. year old female who is a primary care patient of Agapito Games, MD and is actively engaged with the care management team. I reached out to [name] by phone today to assist with re-scheduling a follow up visit with the RN Case Manager  Follow up plan: Telephone appointment with care management team member scheduled for: 04/03/2023  Burman Nieves, [facility] Care Coordination Care Guide Direct Dial: [phone]

## 2023-04-03 ENCOUNTER — Ambulatory Visit: Payer: Self-pay

## 2023-04-03 NOTE — Patient Instructions (Signed)
Visit Information  Thank you for taking time to visit with me today. Please don't hesitate to contact me if I can be of assistance to you.   Following are the goals we discussed today:   Goals Addressed             This Visit's Progress    Care Coordination Activities-continue to improve post hospitalization       Interventions Today    Flowsheet Row Most Recent Value  Chronic Disease   Chronic disease during today's visit Other  [increased sinus issues]  General Interventions   General Interventions Discussed/Reviewed General Interventions Reviewed, Communication with  [reviewed upcoming scheduled appointments]  Communication with PCP/Specialists  [In-basket message to PCP regarding patient report of increase sinus swelling, facial puffiness, pain forehead pressure in eyes.]  Exercise Interventions   Exercise Discussed/Reviewed Physical Activity  [discussed importance of remaining as active as tolerated]  Education Interventions   Education Provided Provided Education  Provided Verbal Education On Insurance Plans  [encouraged patient to take medications as prescribed. RNCM completed conference call with [facility] to confirm that patient has a navigator. Mickle Plumb). Also confirmed process for obtaining full size bed patient is requesting.]  Nutrition Interventions   Nutrition Discussed/Reviewed Nutrition Discussed  [encouraged to eat healthy vegetables, fruit, lean meats]  Pharmacy Interventions   Pharmacy Dicussed/Reviewed Pharmacy Topics Discussed, Medications and their functions, Medication Adherence, Affording Medications  [medications reviewed with patient]  Medication Adherence Not taking medication  [reports not taking Flonase and states has been taking Prednisone 2.5mg  every other day. PCP updated.]            Our next appointment is by telephone on 04/22/23 at 2:30 pm  Please call the care guide team at [phone] if you need to cancel or reschedule your appointment.   If  you are experiencing a Mental Health or Behavioral Health Crisis or need someone to talk to, please call the Suicide and Crisis Lifeline: 71  Kathyrn Sheriff, RN, MSN, BSN, CCM [facility] Care Coordinator [phone]

## 2023-04-03 NOTE — Patient Outreach (Signed)
  Care Coordination   Follow Up Visit Note   04/03/2023 Name: KHENNEDI VALA MRN: [id number] DOB: Jun 02, 1961  BRAEDEN KOEP is a 62 y.o. year old female who sees Metheney, Barbarann Ehlers, MD for primary care. I spoke with  [name] by phone today.  What matters to the patients health and wellness today?  Ms. Werth reports primary concern today is increase problems with her sinuses: swelling/facial puffiness around the cheeks, pain in forehead and pressure in eyes. Per review of chart, she has not been taking the Flonase and reports she has been taking Prednisone 2.5mg  every other day. However, in Epic it is written to be taken daily. She states she will begin to take the 2.5mg  daily. She reports intermittent post nasal drip. Patient states she needs a stronger dose of Prednisone. Requesting RNCM contact provider. Patient also expressed alternative if PCP is not able to get back with her today, she states she will follow up with the after hours call line.   Difficulty getting adjustable full size electric bed. Patient reports she has not heard anything from the insurance company regarding approval for bed. Patient states she was also told that she does not have a Programme researcher, broadcasting/film/video.  Goals Addressed             This Visit's Progress    Care Coordination Activities-continue to improve post hospitalization       Interventions Today    Flowsheet Row Most Recent Value  Chronic Disease   Chronic disease during today's visit Other  [increased sinus issues]  General Interventions   General Interventions Discussed/Reviewed General Interventions Reviewed, Communication with  [reviewed upcoming scheduled appointments]  Communication with PCP/Specialists  [In-basket message to PCP regarding patient report of increase sinus swelling, facial puffiness, pain forehead pressure in eyes.]  Exercise Interventions   Exercise Discussed/Reviewed Physical Activity  [discussed importance of remaining as  active as tolerated]  Education Interventions   Education Provided Provided Education  Provided Verbal Education On Insurance Plans  [encouraged patient to take medications as prescribed. RNCM completed conference call with [facility] to confirm that patient has a navigator. Mickle Plumb). Also confirmed process for obtaining full size bed patient is requesting.]  Nutrition Interventions   Nutrition Discussed/Reviewed Nutrition Discussed  [encouraged to eat healthy vegetables, fruit, lean meats]  Pharmacy Interventions   Pharmacy Dicussed/Reviewed Pharmacy Topics Discussed, Medications and their functions, Medication Adherence, Affording Medications  [medications reviewed with patient]  Medication Adherence Not taking medication  [reports not taking Flonase and states has been taking Prednisone 2.5mg  every other day. PCP updated.]            SDOH assessments and interventions completed:  No  Care Coordination Interventions:  Yes, provided   Follow up plan: Follow up call scheduled for 04/22/23    Encounter Outcome:  Pt. Visit Completed   Kathyrn Sheriff, RN, MSN, BSN, CCM Care Coordinator [phone]

## 2023-04-06 ENCOUNTER — Other Ambulatory Visit: Payer: Self-pay | Admitting: Sports Medicine

## 2023-04-06 ENCOUNTER — Encounter: Payer: Self-pay | Admitting: Family Medicine

## 2023-04-06 DIAGNOSIS — J329 Chronic sinusitis, unspecified: Secondary | ICD-10-CM

## 2023-04-10 ENCOUNTER — Encounter: Payer: Self-pay | Admitting: Physician Assistant

## 2023-04-10 ENCOUNTER — Telehealth (INDEPENDENT_AMBULATORY_CARE_PROVIDER_SITE_OTHER): Payer: 59 | Admitting: Physician Assistant

## 2023-04-10 ENCOUNTER — Other Ambulatory Visit: Payer: 59

## 2023-04-10 DIAGNOSIS — J0100 Acute maxillary sinusitis, unspecified: Secondary | ICD-10-CM | POA: Insufficient documentation

## 2023-04-10 DIAGNOSIS — J449 Chronic obstructive pulmonary disease, unspecified: Secondary | ICD-10-CM

## 2023-04-10 DIAGNOSIS — J301 Allergic rhinitis due to pollen: Secondary | ICD-10-CM | POA: Diagnosis not present

## 2023-04-10 MED ORDER — DOXYCYCLINE HYCLATE 100 MG PO TABS: 100.0000 mg | ORAL_TABLET | Freq: Two times a day (BID) | ORAL | 0 refills | Status: DC

## 2023-04-10 MED ORDER — FLUTICASONE PROPIONATE 50 MCG/ACT NA SUSP: NASAL | 1 refills | Status: DC

## 2023-04-10 MED ORDER — PREDNISONE 20 MG PO TABS: ORAL_TABLET | ORAL | 0 refills | Status: DC

## 2023-04-10 NOTE — Progress Notes (Signed)
Pt reports that this   Facial/head pain for several days. She has been taking sudafed.   Denies any f/s/c. She has had some nausea but doesn't feel that this is related to her sinuses

## 2023-04-10 NOTE — Progress Notes (Signed)
..Virtual Visit via Video Note  I connected with Krystal Burgess on 04/10/23 at  9:50 AM EDT by a video enabled telemedicine application and verified that I am speaking with the correct person using two identifiers.  Location: Patient: home Provider: clinic  .Marland KitchenParticipating in visit:  Patient: Krystal Burgess Provider: [name] PA-C   I discussed the limitations of evaluation and management by telemedicine and the availability of in person appointments. The patient expressed understanding and agreed to proceed.  History of Present Illness: Pt is a 62 yo obese female with COPd and chronic respiratory failure with hx of recurrent sinusitis.   Last CT of head showed left maxillary sinusitis. Pt has been having facial pain more left than right for a few weeks but worsened in the last week. No fever, chills, body aches. Her breathing is stable. Not on O2 during the day but just at night. Her has a terrible headache and nasal congestion. She feels like "she cannot get the mucus out". She has had sudafed with little benefit.   .. Active Ambulatory Problems    Diagnosis Date Noted   Hypothyroidism 02/20/2009   UNSPECIFIED VITAMIN D DEFICIENCY 07/13/2009   Mixed hyperlipidemia 12/12/2008   Severe obesity (BMI >= 40) (HCC) 05/03/2009   Former smoker 12/12/2008   Depression with anxiety 12/12/2008   Mononeuritis 12/12/2008   Essential hypertension 03/26/2009   ALLERGIC RHINITIS 08/17/2009   Intrinsic asthma 12/12/2008   COPD (chronic obstructive pulmonary disease) with chronic bronchitis 12/12/2008   Spinal stenosis of lumbar region 12/12/2008   LEG EDEMA, BILATERAL 03/26/2009   Vitamin B 12 deficiency 06/24/2011   Steroid-induced diabetes 01/06/2013   Bipolar disorder 01/24/2013   Bilateral carpal tunnel syndrome 03/14/2013   Acute angle-closure glaucoma 10/03/2013   Type 2 diabetes mellitus 09/07/2014   Lumbar degenerative disc disease 08/14/2016   Pulmonary nodule, RML, needs  noncontrast CT in February 2019 01/26/2017   Chronic insomnia 01/30/2017   COPD (chronic obstructive pulmonary disease) 09/09/2017   Chronic ethmoidal sinusitis 01/29/2018   RLS (restless legs syndrome) 01/14/2019   Chronic respiratory failure with hypoxia 10/23/2017   Stage 3b chronic kidney disease (CKD) 10/26/2017   Combined forms of age-related cataract of 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   Cutaneous candidiasis 10/24/2017   Sinus tachycardia 04/08/2018   Posterior synechiae (iris), 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   Obesity, Class III, BMI 40-49.9 (morbid obesity) 10/26/2017   Lower GI bleed 10/18/2014   Leukocytosis 10/08/2014   Hypomagnesemia 10/23/2017   Hypernatremia 04/08/2018   History of anxiety 10/08/2014   Glaucoma 10/23/2017   Gastroesophageal reflux disease without esophagitis 10/12/2014   Diabetic peripheral neuropathy associated with type 2 diabetes mellitus 10/12/2014   Chronic back pain 10/24/2017   Acute on chronic respiratory failure with hypoxia 03/02/2021   Elevated d-dimer 03/02/2021   Macrocytic anemia 03/02/2021   Peripheral neuropathy 03/02/2021   OSA (obstructive sleep apnea) 03/02/2021   Abnormal chest CT 03/27/2021   Folic acid deficiency 03/27/2021   Nausea 05/28/2021   COPD exacerbation 10/09/2021   Iron deficiency 01/01/2022   Acute kidney injury superimposed on chronic kidney disease 01/16/2023   Lactic acidosis 01/16/2023   Elevated troponin 01/16/2023   Hypoalbuminemia due to protein-calorie malnutrition 01/16/2023   Failure to thrive in adult 01/29/2023   Lung nodule 01/29/2023   Intractable vomiting with nausea 01/29/2023   Subacute maxillary sinusitis 04/10/2023   Resolved Ambulatory Problems    Diagnosis Date Noted   HYPOKALEMIA 12/12/2008   ANKLE PAIN, LEFT 02/05/2009   SINUSITIS  02/20/2009   Acute non-recurrent pansinusitis 01/29/2018   Chronic maxillary sinusitis 08/17/2019   Urinary tract infection due to Enterococcus 10/16/2014   Pneumonia of  right upper lobe due to infectious organism 10/24/2017   Encephalopathy acute 10/12/2014   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   Acute respiratory failure with hypoxia and hypercapnia 10/09/2014   CAP (community acquired pneumonia) 03/02/2021   Accidental fall 03/02/2021   Abdominal pain 03/02/2021   Rhinosinusitis 02/07/2022   Sepsis due to pneumonia 01/16/2023   Hypotension 01/29/2023   Past Medical History:  Diagnosis Date   Allergy    Hyperlipidemia    Pain    Thyroid disease        Observations/Objective: No acute distress Normal breathing Productive cough   Assessment and Plan: Marland KitchenMarland KitchenShirrell was seen today for sinusitis.  Diagnoses and all orders for this visit:  Subacute maxillary sinusitis -     doxycycline (VIBRA-TABS) 100 MG tablet; Take 1 tablet (100 mg total) by mouth 2 (two) times daily. For 10 days. -     predniSONE (DELTASONE) 20 MG tablet; Take one tablet twice a day for 5 days, then one tablet daily for 3 days then 1/2 tablet for 3 days then go back to regular dosing for prevention. -     fluticasone (FLONASE) 50 MCG/ACT nasal spray; SPRAY 2 SPRAYS INTO EACH NOSTRIL EVERY DAY  Chronic obstructive pulmonary disease, unspecified COPD type  Allergic rhinitis due to pollen, unspecified seasonality    Reviewed CT from 12/23 with sinusitis on report and it is her left sinus again Doxycycline sent with prednisone taper Flonase for nasal congestion and inflammation  Use nasal sinus rinse Follow up as needed or if symptoms worsen  Follow Up Instructions:    I discussed the assessment and treatment plan with the patient. The patient was provided an opportunity to ask questions and all were answered. The patient agreed with the plan and demonstrated an understanding of the instructions.   The patient was advised to call back or seek an in-person evaluation if the symptoms worsen or if the condition fails to improve as  anticipated.   [name], PA-C

## 2023-04-11 DIAGNOSIS — J449 Chronic obstructive pulmonary disease, unspecified: Secondary | ICD-10-CM | POA: Diagnosis not present

## 2023-04-11 DIAGNOSIS — J441 Chronic obstructive pulmonary disease with (acute) exacerbation: Secondary | ICD-10-CM | POA: Diagnosis not present

## 2023-04-14 ENCOUNTER — Institutional Professional Consult (permissible substitution): Payer: 59 | Admitting: Internal Medicine

## 2023-04-16 ENCOUNTER — Telehealth: Payer: Self-pay | Admitting: Family Medicine

## 2023-04-16 DIAGNOSIS — E119 Type 2 diabetes mellitus without complications: Secondary | ICD-10-CM

## 2023-04-16 NOTE — Telephone Encounter (Signed)
Patient called office requesting referral for eye doctor. Patient would like referral sent to [facility]. Please advise.

## 2023-04-16 NOTE — Telephone Encounter (Signed)
Referral placed.

## 2023-04-17 ENCOUNTER — Ambulatory Visit: Payer: Medicare Other | Admitting: Family Medicine

## 2023-04-17 NOTE — Telephone Encounter (Signed)
Patient aware that referral has be faxed to [facility] per request.

## 2023-04-17 NOTE — Telephone Encounter (Signed)
Referral, clinical notes and copies of insurance cards have been faxed to [facility] at [phone]. Office will contact patient to schedule referral appointment.

## 2023-04-22 ENCOUNTER — Ambulatory Visit: Payer: Self-pay

## 2023-04-22 NOTE — Patient Outreach (Signed)
  Care Coordination   Follow Up Visit Note   04/22/2023 Name: SHANIN SZYMANOWSKI MRN: [id number] DOB: 09-10-1961  ELVENIA GODDEN is a 62 y.o. year old female who sees Metheney, Barbarann Ehlers, MD for primary care. I spoke with  [name] by phone today.  What matters to the patients health and wellness today?  RNCM called to follow up. Ms. Lucero request to reschedule telephone visit. She expresses she has several provider appointments coming up. But denies any specific needs at this time.   Goals Addressed             This Visit's Progress    Care Coordination Activities-continue to improve post hospitalization       Interventions Today    Flowsheet Row Most Recent Value  Chronic Disease   Chronic disease during today's visit Other  General Interventions   General Interventions Discussed/Reviewed General Interventions Reviewed  [assessed general well being. telephone call rescheduled per patient request]            SDOH assessments and interventions completed:  No   Care Coordination Interventions:  Yes, provided   Follow up plan: Follow up call scheduled for 05/15/23    Encounter Outcome:  Pt. Visit Completed   Kathyrn Sheriff, RN, MSN, BSN, CCM [facility] Care Coordinator [phone]

## 2023-04-23 ENCOUNTER — Ambulatory Visit: Payer: Self-pay | Admitting: Allergy

## 2023-04-24 ENCOUNTER — Other Ambulatory Visit: Payer: 59

## 2023-04-28 ENCOUNTER — Other Ambulatory Visit: Payer: Self-pay | Admitting: Specialist

## 2023-04-28 ENCOUNTER — Ambulatory Visit (INDEPENDENT_AMBULATORY_CARE_PROVIDER_SITE_OTHER): Payer: 59

## 2023-04-28 DIAGNOSIS — M4726 Other spondylosis with radiculopathy, lumbar region: Secondary | ICD-10-CM | POA: Diagnosis not present

## 2023-04-28 DIAGNOSIS — I10 Essential (primary) hypertension: Secondary | ICD-10-CM | POA: Diagnosis not present

## 2023-04-28 DIAGNOSIS — R911 Solitary pulmonary nodule: Secondary | ICD-10-CM | POA: Diagnosis not present

## 2023-04-28 DIAGNOSIS — M5417 Radiculopathy, lumbosacral region: Secondary | ICD-10-CM

## 2023-04-28 DIAGNOSIS — M5412 Radiculopathy, cervical region: Secondary | ICD-10-CM | POA: Diagnosis not present

## 2023-04-28 DIAGNOSIS — M4722 Other spondylosis with radiculopathy, cervical region: Secondary | ICD-10-CM | POA: Diagnosis not present

## 2023-04-28 DIAGNOSIS — J189 Pneumonia, unspecified organism: Secondary | ICD-10-CM

## 2023-04-28 DIAGNOSIS — R918 Other nonspecific abnormal finding of lung field: Secondary | ICD-10-CM | POA: Diagnosis not present

## 2023-04-28 DIAGNOSIS — M5416 Radiculopathy, lumbar region: Secondary | ICD-10-CM | POA: Diagnosis not present

## 2023-04-28 DIAGNOSIS — R221 Localized swelling, mass and lump, neck: Secondary | ICD-10-CM | POA: Diagnosis not present

## 2023-04-28 DIAGNOSIS — E538 Deficiency of other specified B group vitamins: Secondary | ICD-10-CM | POA: Diagnosis not present

## 2023-04-29 LAB — BASIC METABOLIC PANEL WITH GFR
BUN/Creatinine Ratio: 9 (calc) (ref 6–22)
BUN: 14 mg/dL (ref 7–25)
CO2: 26 mmol/L (ref 20–32)
Calcium: 9.1 mg/dL (ref 8.6–10.4)
Chloride: 103 mmol/L (ref 98–110)
Creat: 1.54 mg/dL — ABNORMAL HIGH (ref 0.50–1.05)
Glucose, Bld: 86 mg/dL (ref 65–99)
Potassium: 4.4 mmol/L (ref 3.5–5.3)
Sodium: 142 mmol/L (ref 135–146)
eGFR: 38 mL/min/{1.73_m2} — ABNORMAL LOW (ref >=60)

## 2023-04-29 LAB — VITAMIN B12: Vitamin B-12: 511 pg/mL (ref 200–1100)

## 2023-04-29 LAB — CBC WITH DIFFERENTIAL/PLATELET
Absolute Monocytes: 1216 cells/uL — ABNORMAL HIGH (ref 200–950)
Basophils Absolute: 64 cells/uL (ref 0–200)
Basophils Relative: 0.5 %
Eosinophils Absolute: 410 cells/uL (ref 15–500)
Eosinophils Relative: 3.2 %
HCT: 33.5 % — ABNORMAL LOW (ref 35.0–45.0)
Hemoglobin: 10.4 g/dL — ABNORMAL LOW (ref 11.7–15.5)
Lymphs Abs: 2867 cells/uL (ref 850–3900)
MCH: 28.7 pg (ref 27.0–33.0)
MCHC: 31 g/dL — ABNORMAL LOW (ref 32.0–36.0)
MCV: 92.5 fL (ref 80.0–100.0)
MPV: 10.1 fL (ref 7.5–12.5)
Monocytes Relative: 9.5 %
Neutro Abs: 8243 cells/uL — ABNORMAL HIGH (ref 1500–7800)
Neutrophils Relative %: 64.4 %
Platelets: 321 10*3/uL (ref 140–400)
RBC: 3.62 10*6/uL — ABNORMAL LOW (ref 3.80–5.10)
RDW: 14.5 % (ref 11.0–15.0)
Total Lymphocyte: 22.4 %
WBC: 12.8 10*3/uL — ABNORMAL HIGH (ref 3.8–10.8)

## 2023-04-29 NOTE — Progress Notes (Signed)
Hi Krystal Burgess, thanks for coming by and get the labs.  Kidney function is up a little bit.  Though you normally bounced between 1.3 and 1.5.  And you are right around 1.5.  Will in the stage III chronic kidney disease range.  Try to avoid anything that can injure the kidneys such as NSAIDs.  That would include ibuprofen and Aleve.  Potassium and calcium look good.  Anemia has improved as well.  Your hemoglobin is dropped down to 8.32 months ago it is now up to 10.4 which is great.  B12 looks good.

## 2023-04-30 ENCOUNTER — Institutional Professional Consult (permissible substitution): Payer: 59 | Admitting: Internal Medicine

## 2023-05-03 ENCOUNTER — Other Ambulatory Visit: Payer: Self-pay | Admitting: Family Medicine

## 2023-05-04 ENCOUNTER — Other Ambulatory Visit: Payer: Self-pay | Admitting: Family Medicine

## 2023-05-04 DIAGNOSIS — E119 Type 2 diabetes mellitus without complications: Secondary | ICD-10-CM

## 2023-05-04 NOTE — Progress Notes (Signed)
Hi Krystal Burgess, CT shows that there were some nodules that were there previously that have resolved but now you have new nodules.  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  I really like to get you with the pulmonologist so that you can be further evaluated and tested for back.  If you have it then it does need to be treated and I think it could definitely reduce how often you are feeling sick and coughing and not feeling well.  I am happy to place a referral.  Please let me know if you have preference for provider or location or if you already have an upcoming appointment with pulmonary.  They did see some plaque formation in your coronary arteries.  Please continue your Crestor.

## 2023-05-04 NOTE — Telephone Encounter (Signed)
Called patient,voicemail is full, will try to call back later, thanks.

## 2023-05-04 NOTE — Progress Notes (Signed)
Krystal Burgess, they did not see anything concerning on the ultrasound but they did recommend that we consider a CAT scan if you are still concerned.  Did see some mildly swollen lymph nodes but nothing that looked worrisome.  I am okay to move forward with that if you feel comfortable with that

## 2023-05-04 NOTE — Telephone Encounter (Signed)
Spoke to pt and she is aware. She will come in on Thursday to have labs drawn.

## 2023-05-04 NOTE — Telephone Encounter (Signed)
Please call pt and advise her that she MUST have FASTING labs done for refills on Crestor.   She has an appt with [name] on 05/14/2023 this is an IN PERSON visit she can get them done then however, her appt is in the afternoon so she may want to come in earlier in the week to have the labwork done. Thank you.

## 2023-05-05 ENCOUNTER — Encounter: Payer: Self-pay | Admitting: Family Medicine

## 2023-05-06 ENCOUNTER — Other Ambulatory Visit: Payer: Self-pay

## 2023-05-06 ENCOUNTER — Other Ambulatory Visit: Payer: Self-pay | Admitting: Family Medicine

## 2023-05-06 DIAGNOSIS — R221 Localized swelling, mass and lump, neck: Secondary | ICD-10-CM

## 2023-05-06 NOTE — Progress Notes (Signed)
Orders Placed This Encounter  Procedures   CT Soft Tissue Neck W Contrast    Standing Status:   Future    Standing Expiration Date:   05/05/2024    Order Specific Question:   If indicated for the ordered procedure, I authorize the administration of contrast media per Radiology protocol    Answer:   Yes    Order Specific Question:   Does the patient have a contrast media/X-ray dye allergy?    Answer:   No    Order Specific Question:   Is patient pregnant?    Answer:   No    Order Specific Question:   Preferred imaging location?    Answer:   [facility]

## 2023-05-06 NOTE — Progress Notes (Signed)
She can just keep the appt at th end of the month. Only 2 weeks out at this point.  It is NOT contagious

## 2023-05-11 DIAGNOSIS — J441 Chronic obstructive pulmonary disease with (acute) exacerbation: Secondary | ICD-10-CM | POA: Diagnosis not present

## 2023-05-11 DIAGNOSIS — J449 Chronic obstructive pulmonary disease, unspecified: Secondary | ICD-10-CM | POA: Diagnosis not present

## 2023-05-12 ENCOUNTER — Ambulatory Visit: Payer: Self-pay

## 2023-05-12 NOTE — Patient Outreach (Signed)
  Care Coordination   Follow Up Visit Note   05/12/2023 Name: Krystal Burgess MRN: [id number] DOB: 06-11-1961  Krystal Burgess is a 62 y.o. year old female who sees Metheney, Barbarann Ehlers, MD for primary care. I spoke with  [name] by phone today.  What matters to the patients health and wellness today?  [name] reports upcoming appointment with PCP on 05/14/23. She states she is being evaluated for an area on her lung. She states she has transportation to her appointments. She reports she is eating well. She is without questions at this time.  Goals Addressed             This Visit's Progress    Care Coordination Activities-continue to improve post hospitalization       Interventions Today    Flowsheet Row Most Recent Value  Chronic Disease   Chronic disease during today's visit Chronic Obstructive Pulmonary Disease (COPD), Hypertension (HTN)  General Interventions   General Interventions Discussed/Reviewed General Interventions Reviewed, Doctor Visits  Doctor Visits Discussed/Reviewed Doctor Visits Discussed, PCP, Specialist  PCP/Specialist Visits Compliance with follow-up visit  [reviewed upcoming appointments including: PCp, eye doctor 05/21/23,  pulmonologist 05/22/23,  06/01/23 [facility],  06/10/23 allergist.]  Exercise Interventions   Exercise Discussed/Reviewed Exercise Reviewed  [advised to continue to be active per provider recommendation]  Education Interventions   Education Provided Provided Education  Provided Verbal Education On Other, Exercise  [advised to continue to be as active as tolerated per provider recommendation, encouraged to eat healthy, take medications as prescribed]  Nutrition Interventions   Nutrition Discussed/Reviewed Nutrition Discussed  Pharmacy Interventions   Pharmacy Dicussed/Reviewed Pharmacy Topics Discussed  Safety Interventions   Safety Discussed/Reviewed Safety Reviewed            SDOH assessments and interventions completed:   No  Care Coordination Interventions:  Yes, provided   Follow up plan: Follow up call scheduled for 06/12/23    Encounter Outcome:  Pt. Visit Completed   Kathyrn Sheriff, RN, MSN, BSN, CCM Care Coordinator [phone]

## 2023-05-12 NOTE — Patient Instructions (Addendum)
Visit Information  Thank you for taking time to visit with me today. Please don't hesitate to contact me if I can be of assistance to you.   Following are the goals we discussed today:  Continue to take medications as prescribed. Continue to attend provider visits as scheduled Continue to eat healthy, lean meats, vegetables, fruits, avoid saturated and transfats  Our next appointment is by telephone on 06/12/23 at 2:00 pm  Please call the care guide team at [phone] if you need to cancel or reschedule your appointment.   If you are experiencing a Mental Health or Behavioral Health Crisis or need someone to talk to, please call the Suicide and Crisis Lifeline: 28  Kathyrn Sheriff, RN, MSN, BSN, CCM [facility] Care Coordinator [phone]

## 2023-05-14 ENCOUNTER — Telehealth (INDEPENDENT_AMBULATORY_CARE_PROVIDER_SITE_OTHER): Payer: 59 | Admitting: Family Medicine

## 2023-05-14 ENCOUNTER — Encounter: Payer: Self-pay | Admitting: Family Medicine

## 2023-05-14 DIAGNOSIS — J441 Chronic obstructive pulmonary disease with (acute) exacerbation: Secondary | ICD-10-CM | POA: Diagnosis not present

## 2023-05-14 DIAGNOSIS — J019 Acute sinusitis, unspecified: Secondary | ICD-10-CM

## 2023-05-14 DIAGNOSIS — I2510 Atherosclerotic heart disease of native coronary artery without angina pectoris: Secondary | ICD-10-CM

## 2023-05-14 DIAGNOSIS — J449 Chronic obstructive pulmonary disease, unspecified: Secondary | ICD-10-CM | POA: Diagnosis not present

## 2023-05-14 DIAGNOSIS — I2583 Coronary atherosclerosis due to lipid rich plaque: Secondary | ICD-10-CM | POA: Diagnosis not present

## 2023-05-14 DIAGNOSIS — Z794 Long term (current) use of insulin: Secondary | ICD-10-CM | POA: Diagnosis not present

## 2023-05-14 DIAGNOSIS — E039 Hypothyroidism, unspecified: Secondary | ICD-10-CM | POA: Diagnosis not present

## 2023-05-14 DIAGNOSIS — I10 Essential (primary) hypertension: Secondary | ICD-10-CM

## 2023-05-14 DIAGNOSIS — E119 Type 2 diabetes mellitus without complications: Secondary | ICD-10-CM

## 2023-05-14 MED ORDER — BLOOD GLUCOSE MONITORING SUPPL DEVI
1.0000 | Freq: Every day | 0 refills | Status: DC | PRN
Start: 2023-05-14 — End: 2024-03-07

## 2023-05-14 MED ORDER — BLOOD GLUCOSE TEST VI STRP
1.0000 | ORAL_STRIP | Freq: Three times a day (TID) | 99 refills | Status: AC
Start: 2023-05-14 — End: 2023-06-13

## 2023-05-14 MED ORDER — CEFDINIR 300 MG PO CAPS
300.0000 mg | ORAL_CAPSULE | Freq: Two times a day (BID) | ORAL | 0 refills | Status: DC
Start: 2023-05-14 — End: 2023-06-11

## 2023-05-14 MED ORDER — ROSUVASTATIN CALCIUM 20 MG PO TABS: 20.0000 mg | ORAL_TABLET | Freq: Every day | ORAL | 3 refills | Status: DC

## 2023-05-14 MED ORDER — LANCETS MISC. MISC
1.0000 | Freq: Three times a day (TID) | 0 refills | Status: AC
Start: 2023-05-14 — End: 2023-06-13

## 2023-05-14 MED ORDER — PREDNISONE 20 MG PO TABS
40.0000 mg | ORAL_TABLET | Freq: Every day | ORAL | 0 refills | Status: DC
Start: 2023-05-14 — End: 2023-06-11

## 2023-05-14 MED ORDER — LANCET DEVICE MISC
1.0000 | Freq: Three times a day (TID) | 0 refills | Status: AC
Start: 2023-05-14 — End: 2023-06-13

## 2023-05-14 NOTE — Assessment & Plan Note (Signed)
Due for A1c.  She was originally scheduled for in person but switch to virtual because she was not feeling well.  She is can to try to come by the next couple weeks to get this done.  She is very interested in a GLP-1 to help her manage her increased appetite and help with weight management as well as help control her diabetes.  She is not currently on any medication she is actually been diet controlled for the last couple of years.

## 2023-05-14 NOTE — Assessment & Plan Note (Signed)
Discussed that they did note coronary artery plaque buildup on her recent chest CT to make sure taking her statin regularly did send a refill today.

## 2023-05-14 NOTE — Addendum Note (Signed)
Addended by: Nani Gasser D on: 05/14/2023 04:41 PM   Modules accepted: Orders

## 2023-05-14 NOTE — Progress Notes (Signed)
Pt reports that she is starting to get sick again. When she gets up to move around she has some trouble. Sometimes she can   She stated that she can feel her heart beating out of her chest and she can hear it in her ears. She also has noticed some ringing in her ears the past few days that has been consistent. She's not sure if this is due to her sinuses or what. She is only taking the Flonase for her sinuses.   She stated she will need a new portable nebulizer. She would like for [name] to order a new one she is going to send over the information about where to send for the equipment. She will also need another portable O2

## 2023-05-14 NOTE — Progress Notes (Addendum)
Virtual Visit via Video Note  I connected with Krystal Burgess on 05/14/23 at  1:20 PM EDT by a video enabled telemedicine application and verified that I am speaking with the correct person using two identifiers.   I discussed the limitations of evaluation and management by telemedicine and the availability of in person appointments. The patient expressed understanding and agreed to proceed.  Patient location: at home Provider location: in office  Subjective:    CC:   Chief Complaint  Patient presents with   Follow-up         HPI:  Pt reports that she is starting to get sick again. When she gets up to move around she has some trouble breathing. Has nasal congestion and bilat ear pain.  Mild ST.  Sxs x 3 days.     She stated that she can feel her heart beating out of her chest and she can hear it in her ears. She also has noticed some ringing in her ears the past few days that has been consistent. She's not sure if this is due to her sinuses or what. She is only taking the Flonase for her sinuses.    She stated she will need a new portable nebulizer. She would like for [name] to order a new one she is going to send over the information about where to send for the equipment. She will also need another portable O2    Past medical history, Surgical history, Family history not pertinant except as noted below, Social history, Allergies, and medications have been entered into the medical record, reviewed, and corrections made.    Objective:    General: Speaking clearly in complete sentences without any shortness of breath.  Alert and oriented x3.  Normal judgment. No apparent acute distress.    Impression and Recommendations:    Problem List Items Addressed This Visit       Cardiovascular and Mediastinum   Essential hypertension - Primary   Relevant Medications   rosuvastatin (CRESTOR) 20 MG tablet   Other Relevant Orders   Lipid Panel w/reflex Direct LDL    Hemoglobin A1c   TSH   CAD (coronary artery disease)    Discussed that they did note coronary artery plaque buildup on her recent chest CT to make sure taking her statin regularly did send a refill today.      Relevant Medications   rosuvastatin (CRESTOR) 20 MG tablet     Respiratory   COPD exacerbation (HCC)   Relevant Medications   predniSONE (DELTASONE) 20 MG tablet   cefdinir (OMNICEF) 300 MG capsule   COPD (chronic obstructive pulmonary disease) (HCC)   Relevant Medications   predniSONE (DELTASONE) 20 MG tablet   cefdinir (OMNICEF) 300 MG capsule     Endocrine   Type 2 diabetes mellitus (HCC)    Due for A1c.  She was originally scheduled for in person but switch to virtual because she was not feeling well.  She is can to try to come by the next couple weeks to get this done.  She is very interested in a GLP-1 to help her manage her increased appetite and help with weight management as well as help control her diabetes.  She is not currently on any medication she is actually been diet controlled for the last couple of years.      Relevant Medications   rosuvastatin (CRESTOR) 20 MG tablet   Blood Glucose Monitoring Suppl DEVI   Glucose Blood (BLOOD  GLUCOSE TEST STRIPS) STRP   Lancet Device MISC   Lancets Misc. MISC   Other Relevant Orders   Lipid Panel w/reflex Direct LDL   Hemoglobin A1c   TSH   Hypothyroidism   Relevant Orders   TSH   Other Visit Diagnoses     Acute non-recurrent sinusitis, unspecified location       Relevant Medications   predniSONE (DELTASONE) 20 MG tablet   cefdinir (OMNICEF) 300 MG capsule       Acute sinusitis with COPD exacerbation will treat with prednisone and cefdinir.  Call if not better in 1 week.  We also discussed her recent chest CT showing a pattern suggestive of possible MAC.  She does have a pulmonary appointment coming up next week which is great so that they can address this further and see if it needs additional workup for  diagnosis.  Orders Placed This Encounter  Procedures   Lipid Panel w/reflex Direct LDL   Hemoglobin A1c   TSH    Meds ordered this encounter  Medications   rosuvastatin (CRESTOR) 20 MG tablet    Sig: Take 1 tablet (20 mg total) by mouth at bedtime.    Dispense:  90 tablet    Refill:  3   predniSONE (DELTASONE) 20 MG tablet    Sig: Take 2 tablets (40 mg total) by mouth daily with breakfast.    Dispense:  10 tablet    Refill:  0   cefdinir (OMNICEF) 300 MG capsule    Sig: Take 1 capsule (300 mg total) by mouth 2 (two) times daily.    Dispense:  14 capsule    Refill:  0   Blood Glucose Monitoring Suppl DEVI    Sig: 1 each by Does not apply route daily as needed. May substitute to any manufacturer covered by patient's insurance.    Dispense:  1 each    Refill:  0   Glucose Blood (BLOOD GLUCOSE TEST STRIPS) STRP    Sig: 1 each by In Vitro route in the morning, at noon, and at bedtime. May substitute to any manufacturer covered by patient's insurance.    Dispense:  100 strip    Refill:  PRN   Lancet Device MISC    Sig: 1 each by Does not apply route in the morning, at noon, and at bedtime. May substitute to any manufacturer covered by patient's insurance.    Dispense:  1 each    Refill:  0   Lancets Misc. MISC    Sig: 1 each by Does not apply route in the morning, at noon, and at bedtime. May substitute to any manufacturer covered by patient's insurance.    Dispense:  100 each    Refill:  0     I discussed the assessment and treatment plan with the patient. The patient was provided an opportunity to ask questions and all were answered. The patient agreed with the plan and demonstrated an understanding of the instructions.   The patient was advised to call back or seek an in-person evaluation if the symptoms worsen or if the condition fails to improve as anticipated.   Nani Gasser, MD

## 2023-05-22 ENCOUNTER — Institutional Professional Consult (permissible substitution): Payer: 59 | Admitting: Internal Medicine

## 2023-05-30 ENCOUNTER — Other Ambulatory Visit: Payer: Self-pay | Admitting: Family Medicine

## 2023-06-01 ENCOUNTER — Other Ambulatory Visit: Payer: 59

## 2023-06-09 ENCOUNTER — Other Ambulatory Visit: Payer: Self-pay | Admitting: Family Medicine

## 2023-06-10 ENCOUNTER — Ambulatory Visit: Payer: Self-pay | Admitting: Allergy

## 2023-06-11 ENCOUNTER — Encounter: Payer: Self-pay | Admitting: Family Medicine

## 2023-06-11 ENCOUNTER — Telehealth (INDEPENDENT_AMBULATORY_CARE_PROVIDER_SITE_OTHER): Payer: 59 | Admitting: Family Medicine

## 2023-06-11 DIAGNOSIS — J449 Chronic obstructive pulmonary disease, unspecified: Secondary | ICD-10-CM | POA: Diagnosis not present

## 2023-06-11 DIAGNOSIS — J441 Chronic obstructive pulmonary disease with (acute) exacerbation: Secondary | ICD-10-CM | POA: Diagnosis not present

## 2023-06-11 MED ORDER — PREDNISONE 20 MG PO TABS: 20.0000 mg | ORAL_TABLET | Freq: Two times a day (BID) | ORAL | 0 refills | Status: AC

## 2023-06-11 MED ORDER — DOXYCYCLINE HYCLATE 100 MG PO TABS
100.0000 mg | ORAL_TABLET | Freq: Two times a day (BID) | ORAL | 0 refills | Status: AC
Start: 2023-06-11 — End: 2023-06-18

## 2023-06-11 NOTE — Assessment & Plan Note (Signed)
-   based on increase in congestion and sputum production will go ahead and start pt on steroids and doxycycyline - she has apt with pulmonology next week - discussed that if she needs to increase oxygen level she needs to go to the ED

## 2023-06-11 NOTE — Progress Notes (Addendum)
Established patient visit   Patient: Krystal Burgess   DOB: 1961-08-18   62 y.o. Female  MRN: [id number] Visit Date: 06/11/2023  Today's healthcare provider: Charlton Amor, DO   No chief complaint on file.   SUBJECTIVE   No chief complaint on file. I connected with  Krystal Burgess on 06/11/23 by a video and audio enabled telemedicine application and verified that I am speaking with the correct person using two identifiers.  Patient Location: Home  Provider Location: Office/Clinic  I discussed the limitations of evaluation and management by telemedicine. The patient expressed understanding and agreed to proceed.  HPI   Pt presents via video visit today for concerns of respiratory problems. She has a hx of pneumonia in December and was hospitalized three times She was diagnosed with MAC. She admits to congestion worsening, shortness of breath. She wears home oxygen and is currently on 4L of oxygen. Does have a pmh of copd  Review of Systems  Constitutional:  Negative for activity change, fatigue and fever.  Respiratory:  Positive for cough and shortness of breath.   Cardiovascular:  Negative for chest pain.  Gastrointestinal:  Negative for abdominal pain.  Genitourinary:  Negative for difficulty urinating.       Current Meds  Medication Sig   albuterol (VENTOLIN HFA) 108 (90 Base) MCG/ACT inhaler INHALE 2 PUFFS INTO THE LUNGS EVERY 4 HOURS AS NEEDED FOR WHEEZING OR SHORTNESS OF BREATH.   ALPRAZolam (XANAX) 1 MG tablet Take 1 mg by mouth 3 (three) times daily as needed for anxiety.   AMBULATORY NON FORMULARY MEDICATION Medication Name: Portable battery-powered nebulizer.Dx: COPD. [facility] [phone] ([phone])   AMBULATORY NON FORMULARY MEDICATION Medication Name: Upright standing Rollator with seat. Dx: code M51.36   Blood Glucose Monitoring Suppl DEVI 1 each by Does not apply route daily as needed. May substitute to any manufacturer covered by  patient's insurance.   cyanocobalamin (VITAMIN B12) 1000 MCG tablet Take 1,000 mcg by mouth daily.   diclofenac (VOLTAREN) 75 MG EC tablet Take 1 tablet (75 mg total) by mouth 2 (two) times daily.   doxycycline (VIBRA-TABS) 100 MG tablet Take 1 tablet (100 mg total) by mouth 2 (two) times daily for 7 days.   ferrous sulfate 324 MG TBEC Take 324 mg by mouth every other day.   fluticasone (FLONASE) 50 MCG/ACT nasal spray SPRAY 2 SPRAYS INTO EACH NOSTRIL EVERY DAY   folic acid (FOLVITE) 1 MG tablet TAKE 1 TABLET BY MOUTH EVERY DAY   gabapentin (NEURONTIN) 600 MG tablet Take 2 tablets (1,200 mg total) by mouth at bedtime. Patient takes 1200 mg daily   Glucose Blood (BLOOD GLUCOSE TEST STRIPS) STRP 1 each by In Vitro route in the morning, at noon, and at bedtime. May substitute to any manufacturer covered by patient's insurance.   ipratropium (ATROVENT) 0.06 % nasal spray Place 2 sprays into both nostrils 4 (four) times daily. (Patient taking differently: Place 2 sprays into both nostrils as needed for rhinitis.)   ipratropium-albuterol (DUONEB) 0.5-2.5 (3) MG/3ML SOLN INHALE 3 MLS INTO THE LUNGS EVERY 2 (TWO) HOURS AS NEEDED (WHEEZING OR SHORTNESS OF BREATH).   Lancet Device MISC 1 each by Does not apply route in the morning, at noon, and at bedtime. May substitute to any manufacturer covered by patient's insurance.   Lancets Misc. MISC 1 each by Does not apply route in the morning, at noon, and at bedtime. May substitute to any manufacturer covered by  patient's insurance.   levothyroxine (SYNTHROID) 88 MCG tablet Take 1 tablet (88 mcg total) by mouth daily.   Multiple Vitamins-Minerals (MULTIVITAMIN ADULTS 50+ PO) Take 1 tablet by mouth daily.   ondansetron (ZOFRAN) 8 MG tablet Take 1 tablet (8 mg total) by mouth every 8 (eight) hours as needed for nausea or vomiting.   oxyCODONE-acetaminophen (PERCOCET) 10-325 MG tablet Take 1 tablet by mouth every 8 (eight) hours as needed for pain.   pantoprazole  (PROTONIX) 40 MG tablet TAKE 1 TABLET BY MOUTH EVERY DAY (Patient taking differently: Take 40 mg by mouth 2 (two) times daily.)   predniSONE (DELTASONE) 20 MG tablet Take 1 tablet (20 mg total) by mouth 2 (two) times daily with a meal for 5 days.   predniSONE (DELTASONE) 5 MG tablet Take 2.5 mg by mouth every morning.   promethazine (PHENERGAN) 25 MG tablet Take 25 mg by mouth 2 (two) times daily as needed for nausea or vomiting.   rosuvastatin (CRESTOR) 20 MG tablet Take 1 tablet (20 mg total) by mouth at bedtime.   sertraline (ZOLOFT) 100 MG tablet TAKE 2 TABLETS BY MOUTH EVERY DAY   theophylline (THEODUR) 300 MG 12 hr tablet TAKE 1 TABLET BY MOUTH TWICE A DAY   tiZANidine (ZANAFLEX) 4 MG tablet TAKE 1 TABLET BY MOUTH AT BEDTIME AS NEEDED FOR MUSCLE SPASMS.   traZODone (DESYREL) 50 MG tablet Take 50 mg by mouth at bedtime as needed for sleep.   TRELEGY ELLIPTA 100-62.5-25 MCG/ACT AEPB INHALE 1 PUFF BY MOUTH EVERY DAY    OBJECTIVE      Physical Exam Vitals and nursing note reviewed.  Constitutional:      General: She is not in acute distress.    Appearance: Normal appearance.  HENT:     Head: Normocephalic and atraumatic.     Right Ear: External ear normal.     Left Ear: External ear normal.     Nose: Nose normal.  Eyes:     Conjunctiva/sclera: Conjunctivae normal.  Cardiovascular:     Rate and Rhythm: Normal rate and regular rhythm.  Pulmonary:     Effort: Pulmonary effort is normal.     Breath sounds: Normal breath sounds.  Neurological:     General: No focal deficit present.     Mental Status: She is alert and oriented to person, place, and time.  Psychiatric:        Mood and Affect: Mood normal.        Behavior: Behavior normal.        Thought Content: Thought content normal.        Judgment: Judgment normal.          ASSESSMENT & PLAN    Problem List Items Addressed This Visit       Respiratory   COPD exacerbation (HCC) - Primary    - based on increase in  congestion and sputum production will go ahead and start pt on steroids and doxycycyline - she has apt with pulmonology next week - discussed that if she needs to increase oxygen level she needs to go to the ED      Relevant Medications   predniSONE (DELTASONE) 20 MG tablet   doxycycline (VIBRA-TABS) 100 MG tablet    Return in about 1 week (around 06/18/2023), or if symptoms worsen or fail to improve.      Meds ordered this encounter  Medications   predniSONE (DELTASONE) 20 MG tablet    Sig: Take 1 tablet (20 mg  total) by mouth 2 (two) times daily with a meal for 5 days.    Dispense:  10 tablet    Refill:  0   doxycycline (VIBRA-TABS) 100 MG tablet    Sig: Take 1 tablet (100 mg total) by mouth 2 (two) times daily for 7 days.    Dispense:  14 tablet    Refill:  0    No orders of the defined types were placed in this encounter.    Charlton Amor, DO  [facility] at [facility] [phone] (phone) [phone] (fax)  [facility]

## 2023-06-11 NOTE — Progress Notes (Signed)
Symptoms haven't really let up since she had pneumonia in December.  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  Last week been progressively getting worse. SOB, sinus congestion, coughing, lungs are hurting.

## 2023-06-12 ENCOUNTER — Ambulatory Visit: Payer: Self-pay

## 2023-06-12 NOTE — Patient Instructions (Signed)
Visit Information  Thank you for taking time to visit with me today. Please don't hesitate to contact me if I can be of assistance to you.   Following are the goals we discussed today:  Continue to take medications as prescribed Continue to attend provider visits as scheduled Continue to eat healthy Contact your provider with health questions or concerns as needed   Our next appointment is by telephone on 07/13/23 at 1:15 pm  Please call the care guide team at [phone] if you need to cancel or reschedule your appointment.   If you are experiencing a Mental Health or Behavioral Health Crisis or need someone to talk to, please call the Suicide and Crisis Lifeline: 62  Kathyrn Sheriff, RN, MSN, BSN, CCM [facility] Care Coordinator [phone]

## 2023-06-12 NOTE — Patient Outreach (Signed)
  Care Coordination   Follow Up Visit Note   06/12/2023 Name: Krystal Burgess MRN: [id number] DOB: December 28, 1960  Krystal Burgess is a 62 y.o. year old female who sees Metheney, Barbarann Ehlers, MD for primary care. I spoke with  Krystal Burgess by phone today.  What matters to the patients health and wellness today?  Video visit completed yesterday by primary provider. Krystal Burgess reports she started taking steroid and antibiotic on yesterday for COPD exacerbation. She reports she notes some improvement. She states she plans to attend pulmonology visit as scheduled on 06/19/23.    Goals Addressed             This Visit's Progress    Care Coordination Activities-continue to improve post hospitalization       Interventions Today    Flowsheet Row Most Recent Value  Chronic Disease   Chronic disease during today's visit Chronic Obstructive Pulmonary Disease (COPD)  General Interventions   General Interventions Discussed/Reviewed General Interventions Reviewed, Doctor Visits  Doctor Visits Discussed/Reviewed Doctor Visits Discussed, Doctor Visits Reviewed, PCP  PCP/Specialist Visits Compliance with follow-up visit  [reviewed upcoming provider visits. Strongly encouraged patient to attend pulmonology visit scheduled for 06/19/23.]  Exercise Interventions   Exercise Discussed/Reviewed Physical Activity  [encouraged to remain as active as tolerated or per provider recommendation]  Education Interventions   Education Provided Provided Education  Provided Verbal Education On When to see the doctor, Medication, Nutrition  [reviewed patient instructions per video visit completed on yesterday. advised to attend provider visits as scheduled, take medications as prescribed.]  Pharmacy Interventions   Pharmacy Dicussed/Reviewed Pharmacy Topics Reviewed            SDOH assessments and interventions completed:  No   Care Coordination Interventions:  Yes, provided   Follow up plan: Follow up call  scheduled for 07/13/23    Encounter Outcome:  Pt. Visit Completed   Kathyrn Sheriff, RN, MSN, BSN, CCM [facility] Care Coordinator [phone]

## 2023-06-16 NOTE — Telephone Encounter (Signed)
To PCP

## 2023-06-19 ENCOUNTER — Institutional Professional Consult (permissible substitution): Payer: 59 | Admitting: Internal Medicine

## 2023-06-22 ENCOUNTER — Other Ambulatory Visit: Payer: 59

## 2023-07-01 ENCOUNTER — Telehealth (INDEPENDENT_AMBULATORY_CARE_PROVIDER_SITE_OTHER): Payer: 59 | Admitting: Physician Assistant

## 2023-07-01 ENCOUNTER — Encounter: Payer: Self-pay | Admitting: Physician Assistant

## 2023-07-01 DIAGNOSIS — J441 Chronic obstructive pulmonary disease with (acute) exacerbation: Secondary | ICD-10-CM | POA: Insufficient documentation

## 2023-07-01 DIAGNOSIS — J329 Chronic sinusitis, unspecified: Secondary | ICD-10-CM | POA: Diagnosis not present

## 2023-07-01 DIAGNOSIS — J449 Chronic obstructive pulmonary disease, unspecified: Secondary | ICD-10-CM

## 2023-07-01 MED ORDER — PREDNISONE 20 MG PO TABS
ORAL_TABLET | ORAL | 0 refills | Status: DC
Start: 2023-07-01 — End: 2023-07-28

## 2023-07-01 MED ORDER — CEFDINIR 300 MG PO CAPS
300.0000 mg | ORAL_CAPSULE | Freq: Two times a day (BID) | ORAL | 0 refills | Status: DC
Start: 2023-07-01 — End: 2023-07-21

## 2023-07-01 NOTE — Progress Notes (Signed)
.Virtual Visit via Video Note  I connected with Krystal Burgess on 07/01/23 at  1:00 PM EDT by a video enabled telemedicine application and verified that I am speaking with the correct person using two identifiers.  Location: Patient: home Provider: clinic  .Marland KitchenParticipating in visit:  Patient: Krystal Burgess Provider: [name] PA-C   I discussed the limitations of evaluation and management by telemedicine and the availability of in person appointments. The patient expressed understanding and agreed to proceed.  History of Present Illness: Pt is a 62 yo obese female with COPD on intermittent O2 2L with recurrent COPD exacerbations and/or sinus infections with the last one being 06/11/2023. She was treated with prednisone and doxycycline. Symptoms did resolve.   Her current symptoms started about 3-4 days ago and in her head right now. She has congestion and blowing out yellow to green sputum. No significant SOB. Chronic cough. Off and on chills. Not checking temperature. She is taking a OTC decongestant with no real benefit. She is compliant with her preventative medications. She uses O2 at night and when she needs it during the day. She lost of pulse ox and not checking stats. She still has not seen pulmonology. Per patient has appt August 28th. Virus has been running through house. She feels like it is moving to her chest.    . Active Ambulatory Problems    Diagnosis Date Noted   Hypothyroidism 02/20/2009   UNSPECIFIED VITAMIN D DEFICIENCY 07/13/2009   Mixed hyperlipidemia 12/12/2008   Severe obesity (BMI >= 40) (HCC) 05/03/2009   Former smoker 12/12/2008   Depression with anxiety 12/12/2008   Mononeuritis 12/12/2008   Essential hypertension 03/26/2009   ALLERGIC RHINITIS 08/17/2009   Intrinsic asthma 12/12/2008   COPD (chronic obstructive pulmonary disease) with chronic bronchitis 12/12/2008   Spinal stenosis of lumbar region 12/12/2008   LEG EDEMA, BILATERAL 03/26/2009   Vitamin B  12 deficiency 06/24/2011   Steroid-induced diabetes (HCC) 01/06/2013   Bipolar disorder (HCC) 01/24/2013   Bilateral carpal tunnel syndrome 03/14/2013   Acute angle-closure glaucoma 10/03/2013   Type 2 diabetes mellitus (HCC) 09/07/2014   Lumbar degenerative disc disease 08/14/2016   Pulmonary nodule, RML, needs noncontrast CT in February 2019 01/26/2017   Chronic insomnia 01/30/2017   COPD (chronic obstructive pulmonary disease) (HCC) 09/09/2017   Chronic ethmoidal sinusitis 01/29/2018   RLS (restless legs syndrome) 01/14/2019   Chronic respiratory failure with hypoxia (HCC) 10/23/2017   Stage 3b chronic kidney disease (CKD) (HCC) 10/26/2017   Combined forms of age-related cataract of 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   Cutaneous candidiasis 10/24/2017   Sinus tachycardia 04/08/2018   Posterior synechiae (iris), 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   Obesity, Class III, BMI 40-49.9 (morbid obesity) (HCC) 10/26/2017   Lower GI bleed 10/18/2014   Leukocytosis 10/08/2014   Hypomagnesemia 10/23/2017   Hypernatremia 04/08/2018   History of anxiety 10/08/2014   Glaucoma 10/23/2017   Gastroesophageal reflux disease without esophagitis 10/12/2014   Diabetic peripheral neuropathy associated with type 2 diabetes mellitus (HCC) 10/12/2014   Chronic back pain 10/24/2017   Acute on chronic respiratory failure with hypoxia (HCC) 03/02/2021   Elevated d-dimer 03/02/2021   Macrocytic anemia 03/02/2021   Peripheral neuropathy 03/02/2021   OSA (obstructive sleep apnea) 03/02/2021   Abnormal chest CT 03/27/2021   Folic acid deficiency 03/27/2021   Nausea 05/28/2021   COPD exacerbation (HCC) 10/09/2021   Iron deficiency 01/01/2022   Acute kidney injury superimposed on chronic kidney disease (HCC) 01/16/2023   Lactic acidosis 01/16/2023  Elevated troponin 01/16/2023   Hypoalbuminemia due to protein-calorie malnutrition (HCC) 01/16/2023   Failure to thrive in adult 01/29/2023   Lung nodule 01/29/2023   Intractable  vomiting with nausea 01/29/2023   Subacute maxillary sinusitis 04/10/2023   CAD (coronary artery disease) 05/14/2023   CKD stage G5/A3, GFR <15 and albumin creatinine ratio >300 mg/g (HCC) 12/02/2022   Hyperglycemia due to diabetes mellitus (HCC) 12/02/2022   SIRS (systemic inflammatory response syndrome) (HCC) 12/02/2022   Recurrent sinusitis 07/01/2023   COPD, frequent exacerbations (HCC) 07/01/2023   Resolved Ambulatory Problems    Diagnosis Date Noted   HYPOKALEMIA 12/12/2008   ANKLE PAIN, LEFT 02/05/2009   SINUSITIS 02/20/2009   Acute non-recurrent pansinusitis 01/29/2018   Chronic maxillary sinusitis 08/17/2019   Urinary tract infection due to Enterococcus 10/16/2014   Pneumonia of right upper lobe due to infectious organism 10/24/2017   Encephalopathy acute 10/12/2014   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  Diagnoses and all orders for this visit:  Recurrent sinusitis -     cefdinir (OMNICEF) 300 MG capsule; Take 1 capsule (300 mg total) by mouth 2 (two) times daily. -     predniSONE (DELTASONE) 20 MG tablet; Take 3 tablets for 3 days, take 2 tablets for 3 days, take 1 tablet for 3 days, take 1/2 tablet for 4 days then transition back to maintained dose.  Chronic obstructive pulmonary disease, unspecified COPD type (HCC)  COPD, frequent exacerbations (HCC) -     cefdinir (OMNICEF) 300 MG  capsule; Take 1 capsule (300 mg total) by mouth 2 (two) times daily. -     predniSONE (DELTASONE) 20 MG tablet; Take 3 tablets for 3 days, take 2 tablets for 3 days, take 1 tablet for 3 days, take 1/2 tablet for 4 days then transition back to maintained dose.   Today symptoms sound more like sinusitis that could be moving into her chest She has long history of recurrent infections and CT scan suspicious for MAC Pt has yet to go to pulmonology, has appt aug 28th.  Strongly encouraged patient NOT to miss this appt.  Start prednisone and omnicef Continue preventative inhalers and medications Encouraged mucines twice a day to help loosen secretions and make easier to get out    Follow Up Instructions:    I discussed the assessment and treatment plan with the patient. The patient was provided an opportunity to ask questions and all were answered. The patient agreed with the plan and demonstrated an understanding of the instructions.   The patient was advised to call back or seek an in-person evaluation if the symptoms worsen or if the condition fails to improve as anticipated.    [name], PA-C

## 2023-07-02 ENCOUNTER — Telehealth: Payer: Self-pay | Admitting: Family Medicine

## 2023-07-02 NOTE — Telephone Encounter (Signed)
Pt called. Prednisone 20 mg not called in. Received her antibiotic.

## 2023-07-03 ENCOUNTER — Other Ambulatory Visit: Payer: Self-pay | Admitting: Family Medicine

## 2023-07-06 ENCOUNTER — Other Ambulatory Visit: Payer: 59

## 2023-07-09 DIAGNOSIS — Z760 Encounter for issue of repeat prescription: Secondary | ICD-10-CM | POA: Diagnosis not present

## 2023-07-09 DIAGNOSIS — M542 Cervicalgia: Secondary | ICD-10-CM | POA: Diagnosis not present

## 2023-07-11 DIAGNOSIS — J441 Chronic obstructive pulmonary disease with (acute) exacerbation: Secondary | ICD-10-CM | POA: Diagnosis not present

## 2023-07-11 DIAGNOSIS — J449 Chronic obstructive pulmonary disease, unspecified: Secondary | ICD-10-CM | POA: Diagnosis not present

## 2023-07-12 ENCOUNTER — Encounter: Payer: Self-pay | Admitting: Family Medicine

## 2023-07-12 DIAGNOSIS — H9313 Tinnitus, bilateral: Secondary | ICD-10-CM

## 2023-07-13 ENCOUNTER — Telehealth: Payer: Self-pay

## 2023-07-13 NOTE — Telephone Encounter (Signed)
Rec ref to ENT if she is ok with that

## 2023-07-13 NOTE — Patient Outreach (Signed)
  Care Coordination   07/13/2023 Name: CORAL TIMME MRN: [id number] DOB: January 27, 1961   Care Coordination Outreach Attempts:  An unsuccessful telephone outreach was attempted for a scheduled appointment today.  Follow Up Plan:  Additional outreach attempts will be made to offer the patient care coordination information and services.   Encounter Outcome:  No Answer   Care Coordination Interventions:  No, not indicated    Kathyrn Sheriff, RN, MSN, BSN, CCM [facility] Care Coordinator [phone]

## 2023-07-16 ENCOUNTER — Ambulatory Visit: Payer: Self-pay | Admitting: Allergy

## 2023-07-17 NOTE — Telephone Encounter (Signed)
Faxed  Ct results to [facility]

## 2023-07-19 ENCOUNTER — Other Ambulatory Visit: Payer: Self-pay | Admitting: Family Medicine

## 2023-07-21 ENCOUNTER — Other Ambulatory Visit: Payer: Self-pay | Admitting: Family Medicine

## 2023-07-23 ENCOUNTER — Other Ambulatory Visit: Payer: Self-pay | Admitting: Family Medicine

## 2023-07-24 ENCOUNTER — Other Ambulatory Visit: Payer: Self-pay | Admitting: Family Medicine

## 2023-07-24 DIAGNOSIS — J4489 Other specified chronic obstructive pulmonary disease: Secondary | ICD-10-CM

## 2023-07-27 ENCOUNTER — Other Ambulatory Visit: Payer: Self-pay | Admitting: Family Medicine

## 2023-07-27 ENCOUNTER — Telehealth: Payer: Self-pay | Admitting: *Deleted

## 2023-07-27 NOTE — Progress Notes (Signed)
  Care Coordination Note  07/27/2023 Name: [name] MRN: [id number] DOB: 1961/07/01  Krystal Burgess is a 61 y.o. year old female who is a primary care patient of Agapito Games, MD and is actively engaged with the care management team. I reached out to [name] by phone today to assist with re-scheduling a follow up visit with the RN Case Manager  Follow up plan: Unsuccessful telephone outreach attempt made. A HIPAA compliant phone message was left for the patient providing contact information and requesting a return call.   Burman Nieves, CCMA Care Coordination Care Guide Direct Dial: [phone]

## 2023-07-27 NOTE — Telephone Encounter (Signed)
Is refill for the rx appropriate? Please advise, thanks.

## 2023-08-02 ENCOUNTER — Other Ambulatory Visit: Payer: Self-pay | Admitting: Family Medicine

## 2023-08-02 DIAGNOSIS — G629 Polyneuropathy, unspecified: Secondary | ICD-10-CM

## 2023-08-10 ENCOUNTER — Other Ambulatory Visit: Payer: Self-pay | Admitting: Family Medicine

## 2023-08-10 DIAGNOSIS — E039 Hypothyroidism, unspecified: Secondary | ICD-10-CM

## 2023-08-11 DIAGNOSIS — J449 Chronic obstructive pulmonary disease, unspecified: Secondary | ICD-10-CM | POA: Diagnosis not present

## 2023-08-18 ENCOUNTER — Telehealth (INDEPENDENT_AMBULATORY_CARE_PROVIDER_SITE_OTHER): Payer: 59 | Admitting: Physician Assistant

## 2023-08-18 ENCOUNTER — Encounter: Payer: Self-pay | Admitting: Physician Assistant

## 2023-08-18 ENCOUNTER — Institutional Professional Consult (permissible substitution): Payer: 59 | Admitting: Internal Medicine

## 2023-08-18 DIAGNOSIS — J441 Chronic obstructive pulmonary disease with (acute) exacerbation: Secondary | ICD-10-CM | POA: Diagnosis not present

## 2023-08-18 DIAGNOSIS — Z87891 Personal history of nicotine dependence: Secondary | ICD-10-CM | POA: Diagnosis not present

## 2023-08-18 DIAGNOSIS — J40 Bronchitis, not specified as acute or chronic: Secondary | ICD-10-CM | POA: Diagnosis not present

## 2023-08-18 DIAGNOSIS — J329 Chronic sinusitis, unspecified: Secondary | ICD-10-CM | POA: Diagnosis not present

## 2023-08-18 MED ORDER — PREDNISONE 20 MG PO TABS
ORAL_TABLET | ORAL | 0 refills | Status: DC
Start: 2023-08-18 — End: 2023-09-08

## 2023-08-18 MED ORDER — AZITHROMYCIN 250 MG PO TABS
ORAL_TABLET | ORAL | 0 refills | Status: DC
Start: 2023-08-18 — End: 2023-10-19

## 2023-08-18 NOTE — Progress Notes (Signed)
..Virtual Visit via Video Note  I connected with Krystal Burgess on 08/18/23 at  1:00 PM EDT by a video enabled telemedicine application and verified that I am speaking with the correct person using two identifiers.  Location: Patient: home Provider: clinic  .Krystal KitchenParticipating in visit:  Patient: Krystal Burgess Provider: [name] PA-C   I discussed the limitations of evaluation and management by telemedicine and the availability of in person appointments. The patient expressed understanding and agreed to proceed.  History of Present Illness: Pt is a 62 yo obese female with frequent COPD exacerbations and sinusitis infections who calls into the clinic with 6 days of cough, sinus pressure, congestion, and SOB. She feels like she has a low grade temperature but has not checked. She is using her nebulizers every 3-4 hours and on O2 4L. Her phelgm has changed to green today. She has not checked for covid.   .. Active Ambulatory Problems    Diagnosis Date Noted   Hypothyroidism 02/20/2009   UNSPECIFIED VITAMIN D DEFICIENCY 07/13/2009   Mixed hyperlipidemia 12/12/2008   Severe obesity (BMI >= 40) (HCC) 05/03/2009   Former smoker 12/12/2008   Depression with anxiety 12/12/2008   Mononeuritis 12/12/2008   Essential hypertension 03/26/2009   ALLERGIC RHINITIS 08/17/2009   Intrinsic asthma 12/12/2008   COPD (chronic obstructive pulmonary disease) with chronic bronchitis 12/12/2008   Spinal stenosis of lumbar region 12/12/2008   LEG EDEMA, BILATERAL 03/26/2009   Vitamin B 12 deficiency 06/24/2011   Steroid-induced diabetes (HCC) 01/06/2013   Bipolar disorder (HCC) 01/24/2013   Bilateral carpal tunnel syndrome 03/14/2013   Acute angle-closure glaucoma 10/03/2013   Type 2 diabetes mellitus (HCC) 09/07/2014   Lumbar degenerative disc disease 08/14/2016   Pulmonary nodule, RML, needs noncontrast CT in February 2019 01/26/2017   Chronic insomnia 01/30/2017   COPD (chronic obstructive pulmonary  disease) (HCC) 09/09/2017   Chronic ethmoidal sinusitis 01/29/2018   RLS (restless legs syndrome) 01/14/2019   Chronic respiratory failure with hypoxia (HCC) 10/23/2017   Stage 3b chronic kidney disease (CKD) (HCC) 10/26/2017   Combined forms of age-related cataract of 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   Cutaneous candidiasis 10/24/2017   Sinus tachycardia 04/08/2018   Posterior synechiae (iris), 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   Obesity, Class III, BMI 40-49.9 (morbid obesity) (HCC) 10/26/2017   Lower GI bleed 10/18/2014   Leukocytosis 10/08/2014   Hypomagnesemia 10/23/2017   Hypernatremia 04/08/2018   History of anxiety 10/08/2014   Glaucoma 10/23/2017   Gastroesophageal reflux disease without esophagitis 10/12/2014   Diabetic peripheral neuropathy associated with type 2 diabetes mellitus (HCC) 10/12/2014   Chronic back pain 10/24/2017   Acute on chronic respiratory failure with hypoxia (HCC) 03/02/2021   Elevated d-dimer 03/02/2021   Macrocytic anemia 03/02/2021   Peripheral neuropathy 03/02/2021   OSA (obstructive sleep apnea) 03/02/2021   Abnormal chest CT 03/27/2021   Folic acid deficiency 03/27/2021   Nausea 05/28/2021   COPD exacerbation (HCC) 10/09/2021   Iron deficiency 01/01/2022   Acute kidney injury superimposed on chronic kidney disease (HCC) 01/16/2023   Lactic acidosis 01/16/2023   Elevated troponin 01/16/2023   Hypoalbuminemia due to protein-calorie malnutrition (HCC) 01/16/2023   Failure to thrive in adult 01/29/2023   Lung nodule 01/29/2023   Intractable vomiting with nausea 01/29/2023   Subacute maxillary sinusitis 04/10/2023   CAD (coronary artery disease) 05/14/2023   CKD stage G5/A3, GFR <15 and albumin creatinine ratio >300 mg/g (HCC) 12/02/2022   Hyperglycemia due to diabetes mellitus (HCC) 12/02/2022   SIRS (systemic inflammatory  response syndrome) (HCC) 12/02/2022   Recurrent sinusitis 07/01/2023   COPD, frequent exacerbations (HCC) 07/01/2023   Resolved Ambulatory  Problems    Diagnosis Date Noted   HYPOKALEMIA 12/12/2008   ANKLE PAIN, LEFT 02/05/2009   SINUSITIS 02/20/2009   Acute non-recurrent pansinusitis 01/29/2018   Chronic maxillary sinusitis 08/17/2019   Urinary tract infection due to Enterococcus 10/16/2014   Pneumonia of right upper lobe due to infectious organism 10/24/2017   Encephalopathy acute 10/12/2014   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   Acute respiratory failure with hypoxia and hypercapnia (HCC) 10/09/2014   CAP (community acquired pneumonia) 03/02/2021   Accidental fall 03/02/2021   Abdominal pain 03/02/2021   Rhinosinusitis 02/07/2022   Sepsis due to pneumonia (HCC) 01/16/2023   Hypotension 01/29/2023   Past Medical History:  Diagnosis Date   Allergy    Hyperlipidemia    Pain    Thyroid disease       Observations/Objective: On chronic oxygen from 2-4L Pale and fatigued appearance Productive cough Labored breathing  Assessment and Plan: Krystal KitchenMarland KitchenRajinder "Krystal Burgess" was seen today for nasal congestion.  Diagnoses and all orders for this visit:  COPD, frequent exacerbations (HCC) -     predniSONE (DELTASONE) 20 MG tablet; Take 3 tablets for 3 days, take 2 tablets for 3 days, take 1 tablet for 3 days, take 1/2 tablet for 4 days and then continue your daily prednisone prescription. -     azithromycin (ZITHROMAX Z-PAK) 250 MG tablet; Take 2 tablets (500 mg) on  Day 1,  followed by 1 tablet (250 mg) once daily on Days 2 through 5.  Sinobronchitis -     predniSONE (DELTASONE) 20 MG tablet; Take 3 tablets for 3 days, take 2 tablets for 3 days, take 1 tablet for 3 days, take 1/2 tablet for 4 days and then continue your daily prednisone prescription. -     azithromycin (ZITHROMAX Z-PAK) 250 MG tablet; Take 2 tablets (500 mg) on  Day 1,  followed by 1 tablet (250 mg) once daily on Days 2 through 5.   Encouraged patient to screen for covid with home test since it is going around and we could treat  with antiviral Added zpak and prednisone Continue O2 and nebulizers every 4-6 hours Symptomatic care discussed Encouraged to check pulse ox and go to ED if under 90 percent Follow up as needed if symptoms persist or worsen   Follow Up Instructions:    I discussed the assessment and treatment plan with the patient. The patient was provided an opportunity to ask questions and all were answered. The patient agreed with the plan and demonstrated an understanding of the instructions.   The patient was advised to call back or seek an in-person evaluation if the symptoms worsen or if the condition fails to improve as anticipated.   [name], PA-C

## 2023-08-25 ENCOUNTER — Ambulatory Visit (INDEPENDENT_AMBULATORY_CARE_PROVIDER_SITE_OTHER): Payer: 59 | Admitting: Family Medicine

## 2023-08-25 DIAGNOSIS — Z0000 Encounter for general adult medical examination without abnormal findings: Secondary | ICD-10-CM | POA: Diagnosis not present

## 2023-08-25 NOTE — Progress Notes (Signed)
MEDICARE ANNUAL WELLNESS VISIT  08/25/2023  Telephone Visit Disclaimer This Medicare AWV was conducted by telephone due to national recommendations for restrictions regarding the COVID-19 Pandemic (e.g. social distancing).  I verified, using two identifiers, that I am speaking with Krystal Burgess or their authorized healthcare agent. I discussed the limitations, risks, security, and privacy concerns of performing an evaluation and management service by telephone and the potential availability of an in-person appointment in the future. The patient expressed understanding and agreed to proceed.  Location of Patient: Home Location of Provider (nurse):  In the office.  Subjective:    Krystal Burgess is a 62 y.o. female patient of Metheney, Barbarann Ehlers, MD who had a Medicare Annual Wellness Visit today via telephone. Krystal Burgess is Disabled and lives with their daughter. she has 1 child. she reports that she is socially active and does interact with friends/family regularly. she is minimally physically active and enjoys playing games on her computer.  Patient Care Team: Agapito Games, MD as PCP - General Marliss Coots, MD as Referring Physician (Pulmonary Disease) Dr. Cain Saupe (Ophthalmology) Colletta Maryland, RN as [facility] Care Management     08/25/2023    9:18 AM 01/29/2023   10:34 PM 01/29/2023    7:55 PM 01/16/2023    4:36 AM 01/15/2023    7:52 PM 08/18/2022    2:47 PM 05/13/2021    9:10 PM  Advanced Directives  Does Patient Have a Medical Advance Directive? No No No No No No No  Would patient like information on creating a medical advance directive? No - Patient declined No - Patient declined No - Patient declined Yes (Inpatient - patient requests chaplain consult to create a medical advance directive) No - Patient declined No - Patient declined No - Patient declined    Hospital Utilization Over the Past 12 Months: # of hospitalizations or ER visits: 3 #  of surgeries: 0  Review of Systems    Patient reports that her overall health is worse compared to last year.  History obtained from chart review and the patient  Patient Reported Readings (BP, Pulse, CBG, Weight, etc) none Per patient no change in vitals since last visit, unable to obtain new vitals due to telehealth visit and lack of equipment.  Pain Assessment Pain : 0-10 Pain Score: 10-Worst pain ever Pain Type: Chronic pain Pain Location: Generalized Pain Descriptors / Indicators: Aching Pain Onset: More than a month ago Pain Frequency: Intermittent Pain Relieving Factors: pain medication  Pain Relieving Factors: pain medication  Current Medications & Allergies (verified) Allergies as of 08/25/2023       Reactions   Risperidone And Related Shortness Of Breath   Aripiprazole Other (See Comments)   REACTION: muscle jerks   Fluoxetine Hcl Other (See Comments)   REACTION: Intolerant   Nsaids Other (See Comments)   Upper GI bleed CKD 3    Paroxetine Other (See Comments)   REACTION: Intolerance   Ziprasidone Hcl Other (See Comments)   shakes   Chantix [varenicline Tartrate] Other (See Comments)   "messing with my mood"   Metformin And Related Nausea Only   Montelukast Other (See Comments)   "it kept me sick with an upper respiratory infection"   Onglyza [saxagliptin] Nausea Only   Augmentin [amoxicillin-pot Clavulanate] Other (See Comments)   Gets yeast infection every time   Fluoxetine Hcl    REACTION: Intolerant        Medication List  Accurate as of August 25, 2023  9:39 AM. If you have any questions, ask your nurse or doctor.          albuterol 108 (90 Base) MCG/ACT inhaler Commonly known as: VENTOLIN HFA INHALE 2 PUFFS INTO THE LUNGS EVERY 4 HOURS AS NEEDED FOR WHEEZING OR SHORTNESS OF BREATH.   ALPRAZolam 1 MG tablet Commonly known as: XANAX Take 1 mg by mouth 3 (three) times daily as needed for anxiety.   azithromycin 250 MG  tablet Commonly known as: Zithromax Z-Pak Take 2 tablets (500 mg) on  Day 1,  followed by 1 tablet (250 mg) once daily on Days 2 through 5.   Blood Glucose Monitoring Suppl Devi 1 each by Does not apply route daily as needed. May substitute to any manufacturer covered by patient's insurance.   escitalopram 20 MG tablet Commonly known as: LEXAPRO Take 20 mg by mouth daily.   ferrous sulfate 324 MG Tbec Take 324 mg by mouth every other day.   fluticasone 50 MCG/ACT nasal spray Commonly known as: FLONASE SPRAY 2 SPRAYS INTO EACH NOSTRIL EVERY DAY   folic acid 1 MG tablet Commonly known as: FOLVITE TAKE 1 TABLET BY MOUTH EVERY DAY   gabapentin 600 MG tablet Commonly known as: NEURONTIN TAKE 2 TABLETS (1,200 MG TOTAL) BY MOUTH AT BEDTIME. PATIENT TAKES 1200 MG DAILY   ipratropium 0.06 % nasal spray Commonly known as: ATROVENT Place 2 sprays into both nostrils 3 (three) times daily.   ipratropium-albuterol 0.5-2.5 (3) MG/3ML Soln Commonly known as: DUONEB INHALE 3 MLS INTO THE LUNGS EVERY 2 (TWO) HOURS AS NEEDED (WHEEZING OR SHORTNESS OF BREATH).   levothyroxine 88 MCG tablet Commonly known as: SYNTHROID TAKE 1 TABLET BY MOUTH EVERY DAY   losartan 100 MG tablet Commonly known as: COZAAR TAKE 1 TABLET BY MOUTH EVERY DAY   MULTIVITAMIN ADULTS 50+ PO Take 1 tablet by mouth daily.   ondansetron 8 MG tablet Commonly known as: ZOFRAN Take 1 tablet (8 mg total) by mouth every 8 (eight) hours as needed for nausea or vomiting.   oxyCODONE-acetaminophen 10-325 MG tablet Commonly known as: PERCOCET Take 1 tablet by mouth every 8 (eight) hours as needed for pain.   pantoprazole 40 MG tablet Commonly known as: PROTONIX TAKE 1 TABLET BY MOUTH EVERY DAY What changed: when to take this   predniSONE 5 MG tablet Commonly known as: DELTASONE TAKE 1/2 TABLET (2.5 MG TOTAL) BY MOUTH DAILY WITH BREAKFAST.   predniSONE 20 MG tablet Commonly known as: DELTASONE Take 3 tablets for 3  days, take 2 tablets for 3 days, take 1 tablet for 3 days, take 1/2 tablet for 4 days and then continue your daily prednisone prescription.   rosuvastatin 20 MG tablet Commonly known as: CRESTOR Take 1 tablet (20 mg total) by mouth at bedtime.   sertraline 100 MG tablet Commonly known as: ZOLOFT TAKE 2 TABLETS BY MOUTH EVERY DAY   theophylline 300 MG 12 hr tablet Commonly known as: THEODUR TAKE 1 TABLET BY MOUTH TWICE A DAY   tiZANidine 4 MG tablet Commonly known as: ZANAFLEX TAKE 1 TABLET BY MOUTH AT BEDTIME AS NEEDED FOR MUSCLE SPASMS.   traZODone 50 MG tablet Commonly known as: DESYREL Take 50 mg by mouth at bedtime as needed for sleep.   Trelegy Ellipta 100-62.5-25 MCG/ACT Aepb Generic drug: Fluticasone-Umeclidin-Vilant INHALE 1 PUFF BY MOUTH EVERY DAY        History (reviewed): Past Medical History:  Diagnosis Date   Allergy  Hyperlipidemia    Pain    Thyroid disease    Past Surgical History:  Procedure Laterality Date   CESAREAN SECTION     CHOLECYSTECTOMY     lipoma removal     RT shoulder   LUMBAR SPINE SURGERY  12/23/2011   Dr. Manson Passey, L4-5   pilonydal cyst  12/22/1992   TOTAL ABDOMINAL HYSTERECTOMY  1999   for abnormal cervical cells and fibroids   Family History  Problem Relation Age of Onset   Diabetes Brother    Depression Brother        bipolar   Cancer Brother        throat   Diabetes Other        grandmother   Depression Mother    Hyperlipidemia Mother    Hypertension Mother    Pancreatic cancer Mother    Hypertension Father    Social History   Socioeconomic History   Marital status: Divorced    Spouse name: Not on file   Number of children: 1   Years of education: 1   Highest education level: High school graduate  Occupational History   Occupation: Disabled  Tobacco Use   Smoking status: Former    Current packs/day: 0.00    Types: Cigarettes    Quit date: 03/19/2020    Years since quitting: 3.4   Smokeless tobacco:  Never  Vaping Use   Vaping status: Every Day   Substances: Nicotine  Substance and Sexual Activity   Alcohol use: Not Currently   Drug use: No   Sexual activity: Not Currently  Other Topics Concern   Not on file  Social History Narrative   Lives with her daughter. She enjoys playing games on her computer.   Social Determinants of Health   Financial Resource Strain: Low Risk  (08/25/2023)   Overall Financial Resource Strain (CARDIA)    Difficulty of Paying Living Expenses: Not hard at all  Food Insecurity: No Food Insecurity (08/25/2023)   Hunger Vital Sign    Worried About Running Out of Food in the Last Year: Never true    Ran Out of Food in the Last Year: Never true  Transportation Needs: No Transportation Needs (08/25/2023)   PRAPARE - Administrator, Civil Service (Medical): No    Lack of Transportation (Non-Medical): No  Physical Activity: Inactive (08/25/2023)   Exercise Vital Sign    Days of Exercise per Week: 0 days    Minutes of Exercise per Session: 0 min  Stress: Stress Concern Present (08/25/2023)   [employer] of Occupational Health - Occupational Stress Questionnaire    Feeling of Stress : Rather much  Social Connections: Socially Isolated (08/25/2023)   Social Connection and Isolation Panel [NHANES]    Frequency of Communication with Friends and Family: Once a week    Frequency of Social Gatherings with Friends and Family: More than three times a week    Attends Religious Services: Never    Database administrator or Organizations: No    Attends Banker Meetings: Never    Marital Status: Divorced    Activities of Daily Living    08/25/2023    9:30 AM 01/29/2023   10:34 PM  In your present state of health, do you have any difficulty performing the following activities:  Hearing? 1 0  Comment some difficulty   Vision? 1 0  Comment some difficulty   Difficulty concentrating or making decisions? 1 0  Comment some difficulty  Walking or  climbing stairs? 1 1  Comment due to breathing   Dressing or bathing? 0 1  Doing errands, shopping? 1 1  Comment her daughter usually goes with her   Preparing Food and eating ? Y   Comment her daughter usually prepares her food   Using the Toilet? N   In the past six months, have you accidently leaked urine? Y   Do you have problems with loss of bowel control? N   Managing your Medications? N   Managing your Finances? Y   Comment her daughter Cytogeneticist or managing your Housekeeping? Y   Comment due to her breathing she is not able to     Patient Education/ Literacy How often do you need to have someone help you when you read instructions, pamphlets, or other written materials from your doctor or pharmacy?: 1 - Never What is the last grade level you completed in school?: 12th grade  Exercise    Diet Patient reports consuming  1-3  meals a day and 1-3 snack(s) a day Patient reports that her primary diet is: Regular Patient reports that she does have regular access to food.   Depression Screen    08/25/2023    9:20 AM 07/01/2023    1:02 PM 12/16/2022    3:49 PM 08/18/2022    2:44 PM 11/28/2021    2:52 PM 07/18/2021    2:10 PM 12/29/2018   11:48 AM  PHQ 2/9 Scores  PHQ - 2 Score 6 6 6 4  0 6   PHQ- 9 Score 24 24 21 4  21    Exception Documentation       Patient refusal     Fall Risk    08/25/2023    9:18 AM 07/01/2023    1:02 PM 01/22/2023    2:37 PM 08/18/2022    2:43 PM 06/17/2022    2:10 PM  Fall Risk   Falls in the past year? 1 1 1  0 0  Number falls in past yr: 0 1 1 0 0  Injury with Fall? 0 0 0 0 0  Risk for fall due to : Impaired mobility;History of fall(s) History of fall(s);Impaired mobility;Impaired balance/gait History of fall(s);Impaired mobility;Impaired balance/gait;Medication side effect;Other (Comment) No Fall Risks No Fall Risks  Risk for fall due to: Comment   morbid obesity    Follow up Falls evaluation completed Falls evaluation  completed Falls prevention discussed;Follow up appointment;Education provided Falls evaluation completed Falls prevention discussed;Falls evaluation completed     Objective:  Krystal Burgess seemed alert and oriented and she participated appropriately during our telephone visit.  Blood Pressure Weight BMI  BP Readings from Last 3 Encounters:  02/02/23 97/60  01/20/23 (!) 97/56  12/16/22 126/79   Wt Readings from Last 3 Encounters:  03/13/23 233 lb (105.7 kg)  02/05/23 233 lb (105.7 kg)  02/02/23 235 lb 14.3 oz (107 kg)   BMI Readings from Last 1 Encounters:  03/13/23 39.99 kg/m    *Unable to obtain current vital signs, weight, and BMI due to telephone visit type  Hearing/Vision  Krystal Burgess did not seem to have difficulty with hearing/understanding during the telephone conversation Reports that she has not had a formal eye exam by an eye care professional within the past year Reports that she has not had a formal hearing evaluation within the past year *Unable to fully assess hearing and vision during telephone visit type  Cognitive Function:    08/25/2023  9:34 AM 08/18/2022    2:50 PM  6CIT Screen  What Year? 0 points 0 points  What month? 0 points 0 points  What time? 0 points 0 points  Count back from 20 0 points 0 points  Months in reverse 0 points 0 points  Repeat phrase 2 points 0 points  Total Score 2 points 0 points   (Normal:0-7, Significant for Dysfunction: >8)  Normal Cognitive Function Screening: Yes   Immunization & Health Maintenance Record Immunization History  Administered Date(s) Administered   Influenza Inj Mdck Quad Pf 12/08/2022   Influenza Split 09/07/2012   Influenza Whole 08/26/2011   Influenza,inj,Quad PF,6+ Mos 08/31/2013, 09/07/2014, 11/01/2015, 08/14/2016, 09/17/2017, 08/17/2018, 10/03/2019, 11/28/2021   Influenza,inj,quad, With Preservative 03/21/2016   Influenza-Unspecified 03/21/2016   Pneumococcal Polysaccharide-23 10/14/2011   Tdap  11/05/2017    Health Maintenance  Topic Date Due   OPHTHALMOLOGY EXAM  08/25/2023 (Originally 06/01/2020)   FOOT EXAM  08/26/2023 (Originally 11/28/2022)   HEMOGLOBIN A1C  08/30/2023 (Originally 07/17/2023)   Zoster Vaccines- Shingrix (1 of 2) 11/24/2023 (Originally 03/01/1982)   PAP SMEAR-Modifier  12/17/2023 (Originally 12/22/2010)   COVID-19 Vaccine (1) 01/18/2024 (Originally 03/01/1966)   INFLUENZA VACCINE  03/21/2024 (Originally 07/23/2023)   MAMMOGRAM  08/24/2024 (Originally 04/25/2019)   Diabetic kidney evaluation - Urine ACR  12/17/2023   Diabetic kidney evaluation - eGFR measurement  04/27/2024   Medicare Annual Wellness (AWV)  08/24/2024   Colonoscopy  10/19/2024   DTaP/Tdap/Td (2 - Td or Tdap) 11/06/2027   Hepatitis C Screening  Completed   HIV Screening  Completed   HPV VACCINES  Aged Out   Pneumococcal Vaccine 45-87 Years old  Discontinued       Assessment  This is a routine wellness examination for Krystal Burgess.  Health Maintenance: Due or Overdue There are no preventive care reminders to display for this patient.   Krystal Burgess does not need a referral for Community Assistance: Care Management:   no Social Work:    no Prescription Assistance:  no Nutrition/Diabetes Education:  no   Plan:  Personalized Goals  Goals Addressed               This Visit's Progress     Patient Stated (pt-stated)        Patient stated that she would like to be more independent.        Personalized Health Maintenance & Screening Recommendations  Influenza vaccine Screening mammography Screening Pap smear and pelvic exam  Foot exam Eye exam  Hemoglobin A1C Shingles vaccine  Patient declined any referrals or vaccines at this time.   Lung Cancer Screening Recommended: yes (Low Dose CT Chest recommended if Age 51-80 years, 20 pack-year currently smoking OR have quit w/in past 15 years) Hepatitis C Screening recommended: no HIV Screening recommended: no  Advanced  Directives: Written information was not prepared per patient's request.  Referrals & Orders No orders of the defined types were placed in this encounter.   Follow-up Plan Follow-up with Agapito Games, MD as planned Please let us know when you are ready for referrals for your screenings.  Medicare wellness visit in one year.  Patient will access AVS on my chart.   I have personally reviewed and noted the following in the patient's chart:   Medical and social history Use of alcohol, tobacco or illicit drugs  Current medications and supplements Functional ability and status Nutritional status Physical activity Advanced directives List of other physicians Hospitalizations, surgeries, and ER  visits in previous 12 months Vitals Screenings to include cognitive, depression, and falls Referrals and appointments  In addition, I have reviewed and discussed with Krystal Burgess certain preventive protocols, quality metrics, and best practice recommendations. A written personalized care plan for preventive services as well as general preventive health recommendations is available and can be mailed to the patient at her request.      Modesto Charon, RN BSN  08/25/2023

## 2023-08-25 NOTE — Patient Instructions (Signed)
MEDICARE ANNUAL WELLNESS VISIT Health Maintenance Summary and Written Plan of Care  Krystal Burgess ,  Thank you for allowing me to perform your Medicare Annual Wellness Visit and for your ongoing commitment to your health.   Health Maintenance & Immunization History Health Maintenance  Topic Date Due   OPHTHALMOLOGY EXAM  08/25/2023 (Originally 06/01/2020)   FOOT EXAM  08/26/2023 (Originally 11/28/2022)   HEMOGLOBIN A1C  08/30/2023 (Originally 07/17/2023)   Zoster Vaccines- Shingrix (1 of 2) 11/24/2023 (Originally 03/01/1982)   PAP SMEAR-Modifier  12/17/2023 (Originally 12/22/2010)   COVID-19 Vaccine (1) 01/18/2024 (Originally 03/01/1968)   INFLUENZA VACCINE  03/21/2024 (Originally 07/23/2023)   MAMMOGRAM  08/24/2024 (Originally 04/25/2019)   Diabetic kidney evaluation - Urine ACR  12/17/2023   Diabetic kidney evaluation - eGFR measurement  04/27/2024   Medicare Annual Wellness (AWV)  08/24/2024   Colonoscopy  10/19/2024   DTaP/Tdap/Td (2 - Td or Tdap) 11/06/2027   Hepatitis C Screening  Completed   HIV Screening  Completed   HPV VACCINES  Aged Out   Pneumococcal Vaccine 39-50 Years old  Discontinued   Immunization History  Administered Date(s) Administered   Influenza Inj Mdck Quad Pf 12/08/2022   Influenza Split 09/07/2012   Influenza Whole 08/26/2011   Influenza,inj,Quad PF,6+ Mos 08/31/2013, 09/07/2014, 11/01/2015, 08/14/2016, 09/17/2017, 08/17/2018, 10/03/2019, 11/28/2021   Influenza,inj,quad, With Preservative 03/21/2016   Influenza-Unspecified 03/21/2016   Pneumococcal Polysaccharide-23 10/14/2011   Tdap 11/05/2017    These are the patient goals that we discussed:  Goals Addressed               This Visit's Progress     Patient Stated (pt-stated)        Patient stated that she would like to be more independent.          This is a list of Health Maintenance Items that are overdue or due now: Influenza vaccine Screening mammography Screening Pap smear and pelvic exam   Foot exam Eye exam  Hemoglobin A1C Shingles vaccine  Patient declined any referrals or vaccines at this time.    Orders/Referrals Placed Today: No orders of the defined types were placed in this encounter.  (Contact our referral department at [phone] if you have not spoken with someone about your referral appointment within the next 5 days)    Follow-up Plan Follow-up with Agapito Games, MD as planned Please let us know when you are ready for referrals for your screenings.  Medicare wellness visit in one year.  Patient will access AVS on my chart.      Health Maintenance, Female Adopting a healthy lifestyle and getting preventive care are important in promoting health and wellness. Ask your health care provider about: The right schedule for you to have regular tests and exams. Things you can do on your own to prevent diseases and keep yourself healthy. What should I know about diet, weight, and exercise? Eat a healthy diet  Eat a diet that includes plenty of vegetables, fruits, low-fat dairy products, and lean protein. Do not eat a lot of foods that are high in solid fats, added sugars, or sodium. Maintain a healthy weight Body mass index (BMI) is used to identify weight problems. It estimates body fat based on height and weight. Your health care provider can help determine your BMI and help you achieve or maintain a healthy weight. Get regular exercise Get regular exercise. This is one of the most important things you can do for your health. Most adults should: Exercise  for at least 150 minutes each week. The exercise should increase your heart rate and make you sweat (moderate-intensity exercise). Do strengthening exercises at least twice a week. This is in addition to the moderate-intensity exercise. Spend less time sitting. Even light physical activity can be beneficial. Watch cholesterol and blood lipids Have your blood tested for lipids and cholesterol at  60 years of age, then have this test every 5 years. Have your cholesterol levels checked more often if: Your lipid or cholesterol levels are high. You are older than 60 years of age. You are at high risk for heart disease. What should I know about cancer screening? Depending on your health history and family history, you may need to have cancer screening at various ages. This may include screening for: Breast cancer. Cervical cancer. Colorectal cancer. Skin cancer. Lung cancer. What should I know about heart disease, diabetes, and high blood pressure? Blood pressure and heart disease High blood pressure causes heart disease and increases the risk of stroke. This is more likely to develop in people who have high blood pressure readings or are overweight. Have your blood pressure checked: Every 3-5 years if you are 12-45 years of age. Every year if you are 4 years old or older. Diabetes Have regular diabetes screenings. This checks your fasting blood sugar level. Have the screening done: Once every three years after age 3 if you are at a normal weight and have a low risk for diabetes. More often and at a younger age if you are overweight or have a high risk for diabetes. What should I know about preventing infection? Hepatitis B If you have a higher risk for hepatitis B, you should be screened for this virus. Talk with your health care provider to find out if you are at risk for hepatitis B infection. Hepatitis C Testing is recommended for: Everyone born from [birth year] through 1963. Anyone with known risk factors for hepatitis C. Sexually transmitted infections (STIs) Get screened for STIs, including gonorrhea and chlamydia, if: You are sexually active and are younger than 60 years of age. You are older than 60 years of age and your health care provider tells you that you are at risk for this type of infection. Your sexual activity has changed since you were last screened, and you are at  increased risk for chlamydia or gonorrhea. Ask your health care provider if you are at risk. Ask your health care provider about whether you are at high risk for HIV. Your health care provider may recommend a prescription medicine to help prevent HIV infection. If you choose to take medicine to prevent HIV, you should first get tested for HIV. You should then be tested every 3 months for as long as you are taking the medicine. Pregnancy If you are about to stop having your period (premenopausal) and you may become pregnant, seek counseling before you get pregnant. Take 400 to 800 micrograms (mcg) of folic acid every day if you become pregnant. Ask for birth control (contraception) if you want to prevent pregnancy. Osteoporosis and menopause Osteoporosis is a disease in which the bones lose minerals and strength with aging. This can result in bone fractures. If you are 23 years old or older, or if you are at risk for osteoporosis and fractures, ask your health care provider if you should: Be screened for bone loss. Take a calcium or vitamin D supplement to lower your risk of fractures. Be given hormone replacement therapy (HRT) to treat symptoms of  menopause. Follow these instructions at home: Alcohol use Do not drink alcohol if: Your health care provider tells you not to drink. You are pregnant, may be pregnant, or are planning to become pregnant. If you drink alcohol: Limit how much you have to: 0-1 drink a day. Know how much alcohol is in your drink. In the [place] one drink equals one 12 oz bottle of beer (355 mL), one 5 oz glass of wine (148 mL), or one 1 oz glass of hard liquor (44 mL). Lifestyle Do not use any products that contain nicotine or tobacco. These products include cigarettes, chewing tobacco, and vaping devices, such as e-cigarettes. If you need help quitting, ask your health care provider. Do not use street drugs. Do not share needles. Ask your health care provider for help  if you need support or information about quitting drugs. General instructions Schedule regular health, dental, and eye exams. Stay current with your vaccines. Tell your health care provider if: You often feel depressed. You have ever been abused or do not feel safe at home. Summary Adopting a healthy lifestyle and getting preventive care are important in promoting health and wellness. Follow your health care provider's instructions about healthy diet, exercising, and getting tested or screened for diseases. Follow your health care provider's instructions on monitoring your cholesterol and blood pressure. This information is not intended to replace advice given to you by your health care provider. Make sure you discuss any questions you have with your health care provider. Document Revised: 04/29/2021 Document Reviewed: 04/29/2021 Elsevier Patient Education  2024 [employer].

## 2023-08-27 DIAGNOSIS — Z760 Encounter for issue of repeat prescription: Secondary | ICD-10-CM | POA: Diagnosis not present

## 2023-08-27 DIAGNOSIS — M7912 Myalgia of auxiliary muscles, head and neck: Secondary | ICD-10-CM | POA: Diagnosis not present

## 2023-08-27 DIAGNOSIS — Z79899 Other long term (current) drug therapy: Secondary | ICD-10-CM | POA: Diagnosis not present

## 2023-08-28 NOTE — Progress Notes (Signed)
  Care Coordination Note  08/28/2023 Name: VICTOIRE LEAVY MRN: [id number] DOB: 1961-06-04  Krystal Burgess is a 62 y.o. year old female who is a primary care patient of Agapito Games, MD and is actively engaged with the care management team. I reached out to [name] by phone today to assist with re-scheduling a follow up visit with the RN Case Manager  Follow up plan: Telephone appointment with care management team member scheduled for: 09/22/2023  Burman Nieves, [facility] Care Coordination Care Guide Direct Dial: [phone]

## 2023-09-08 ENCOUNTER — Encounter: Payer: Self-pay | Admitting: Family Medicine

## 2023-09-08 ENCOUNTER — Telehealth (INDEPENDENT_AMBULATORY_CARE_PROVIDER_SITE_OTHER): Payer: 59 | Admitting: Family Medicine

## 2023-09-08 DIAGNOSIS — J441 Chronic obstructive pulmonary disease with (acute) exacerbation: Secondary | ICD-10-CM

## 2023-09-08 DIAGNOSIS — J40 Bronchitis, not specified as acute or chronic: Secondary | ICD-10-CM | POA: Diagnosis not present

## 2023-09-08 DIAGNOSIS — J329 Chronic sinusitis, unspecified: Secondary | ICD-10-CM

## 2023-09-08 DIAGNOSIS — Z87891 Personal history of nicotine dependence: Secondary | ICD-10-CM | POA: Diagnosis not present

## 2023-09-08 DIAGNOSIS — F418 Other specified anxiety disorders: Secondary | ICD-10-CM

## 2023-09-08 MED ORDER — DOXYCYCLINE HYCLATE 100 MG PO TABS: 100.0000 mg | ORAL_TABLET | Freq: Two times a day (BID) | ORAL | 0 refills | Status: DC

## 2023-09-08 MED ORDER — PREDNISONE 20 MG PO TABS
ORAL_TABLET | ORAL | 0 refills | Status: DC
Start: 2023-09-08 — End: 2023-10-19

## 2023-09-08 NOTE — Progress Notes (Signed)
Pt reports that she has been  Runny nose 2 days ago. This morning her breathing was worse. She has itchy throat. She hasn't taken a Covid test she stated that her daughter and grandson have been to UC and they have been tested and are both negative. She said that they have been passing   She reports that she started Vaping due to being nervous and anxious she did this to stop eating so much. She said that she started this about 1-2 months  Pt sent the following note via mychart so she wouldn't forget to tell [name].   Dr Judie Petit,  Medication change per Dr. Estella Husk.. Vilazodone HCL 20 mg replacing Zoloft. Trazadone 150 I also need you to once again do a  prescription for.... Battery operated nebulizer and battery operated oxygen concentrator. Company CAN NOT be associated with   [employer], of [employer], bad experiences with these companies over charging etc,,Have heard Collagen helps with inflamation. joint pain??I know you can't discuss Lowella Bandy with me, BUT she hasn't followed through with an email, her depression meds are not working.  Thanks, [name]

## 2023-09-08 NOTE — Assessment & Plan Note (Signed)
Fortunately she started vaping recently to help suppress her appetite so she could work on her weight.

## 2023-09-08 NOTE — Assessment & Plan Note (Signed)
Will treat today with prednisone and doxycycline.  Unfortunately she does not have a pulse oximeter at home she used to have 1 but cannot find it I encouraged her to consider wearing her oxygen around-the-clock and if she feels like she is not improving or getting worse to go to the emergency department or at least urgent care this weekend.  She agreed and says that she will go.

## 2023-09-08 NOTE — Assessment & Plan Note (Signed)
    09/08/2023   12:05 PM 08/25/2023    9:20 AM 07/01/2023    1:02 PM 12/16/2022    3:49 PM 08/18/2022    2:44 PM  Depression screen PHQ 2/9  Decreased Interest 3 3 3 3 2   Down, Depressed, Hopeless 2 3 3 3 2   PHQ - 2 Score 5 6 6 6 4   Altered sleeping 2 3 3 1  0  Tired, decreased energy 3 3 3 3  0  Change in appetite 2 2 2 3  0  Feeling bad or failure about yourself  3 3 3 3  0  Trouble concentrating 2 3 3 3  0  Moving slowly or fidgety/restless 1 2 2  0 0  Suicidal thoughts 2 2 2 2  0  PHQ-9 Score 20 24 24 21 4   Difficult doing work/chores Somewhat difficult Very difficult  Somewhat difficult Not difficult at all   To hear that she is trying to get connected with a therapist.  She has a lot going on personally and I think that this would be fantastic for her.

## 2023-09-08 NOTE — Progress Notes (Signed)
Virtual Visit via Video Note  I connected with Krystal Burgess on 09/08/23 at  2:40 PM EDT by a video enabled telemedicine application and verified that I am speaking with the correct person using two identifiers.   I discussed the limitations of evaluation and management by telemedicine and the availability of in person appointments. The patient expressed understanding and agreed to proceed.  Patient location: at home Provider location: in office  Subjective:    CC:   Chief Complaint  Patient presents with   Sinusitis    HPI: She says she is felt sick for a few days.  Other household members have been sick as well in fact 2 of them went to the urgent care and tested negative for COVID.  She says her symptoms came on pretty rapidly she said she woke up a few days ago feeling short of breath and then developed gradually over the next day of scratchy throat with a lot of phlegm coating the back of her throat.  She now has been coughing more.  No fevers or chills.  She has been using her nebulizer she really only wears her oxygen if she sleeps or if she gets up and moves.  She says she is getting winded even getting up and going to the bathroom she says her heart will pound and she has to wait until she can catch her breath.  She did let me know that she has made an appointment to see a therapist.  She also wanted to know my thoughts on collagen supplementation to help with joint pain.   Past medical history, Surgical history, Family history not pertinant except as noted below, Social history, Allergies, and medications have been entered into the medical record, reviewed, and corrections made.    Objective:    General: Speaking clearly in complete sentences without any shortness of breath.  Alert and oriented x3.  Normal judgment. No apparent acute distress.    Impression and Recommendations:    Problem List Items Addressed This Visit       Respiratory   COPD, frequent  exacerbations (HCC)   Relevant Medications   predniSONE (DELTASONE) 20 MG tablet   COPD exacerbation (HCC) - Primary    Will treat today with prednisone and doxycycline.  Unfortunately she does not have a pulse oximeter at home she used to have 1 but cannot find it I encouraged her to consider wearing her oxygen around-the-clock and if she feels like she is not improving or getting worse to go to the emergency department or at least urgent care this weekend.  She agreed and says that she will go.      Relevant Medications   predniSONE (DELTASONE) 20 MG tablet     Other   Former smoker    Fortunately she started vaping recently to help suppress her appetite so she could work on her weight.      Depression with anxiety       09/08/2023   12:05 PM 08/25/2023    9:20 AM 07/01/2023    1:02 PM 12/16/2022    3:49 PM 08/18/2022    2:44 PM  Depression screen PHQ 2/9  Decreased Interest 3 3 3 3 2   Down, Depressed, Hopeless 2 3 3 3 2   PHQ - 2 Score 5 6 6 6 4   Altered sleeping 2 3 3 1  0  Tired, decreased energy 3 3 3 3  0  Change in appetite 2 2 2 3  0  Feeling bad or failure about yourself  3 3 3 3  0  Trouble concentrating 2 3 3 3  0  Moving slowly or fidgety/restless 1 2 2  0 0  Suicidal thoughts 2 2 2 2  0  PHQ-9 Score 20 24 24 21 4   Difficult doing work/chores Somewhat difficult Very difficult  Somewhat difficult Not difficult at all   To hear that she is trying to get connected with a therapist.  She has a lot going on personally and I think that this would be fantastic for her.      Other Visit Diagnoses     Sinobronchitis       Relevant Medications   predniSONE (DELTASONE) 20 MG tablet   doxycycline (VIBRA-TABS) 100 MG tablet       No orders of the defined types were placed in this encounter.   Meds ordered this encounter  Medications   predniSONE (DELTASONE) 20 MG tablet    Sig: Take 3 tablets for 3 days, take 2 tablets for 3 days, take 1 tablet for 3 days, take 1/2 tablet  for 4 days and then continue your daily prednisone prescription.    Dispense:  20 tablet    Refill:  0   doxycycline (VIBRA-TABS) 100 MG tablet    Sig: Take 1 tablet (100 mg total) by mouth 2 (two) times daily.    Dispense:  14 tablet    Refill:  0    She also wanted to ask about maybe being able to take extra prednisone if she does not feel well right before an appointment we have tried multiple referrals to ENT pulmonary etc. over the last couple years but she will tend to get sick before the appointment and then cannot make it.  I think it would be reasonable to call her in some extra prednisone before her appointment to make sure that she is able to go.  I discussed the assessment and treatment plan with the patient. The patient was provided an opportunity to ask questions and all were answered. The patient agreed with the plan and demonstrated an understanding of the instructions.   The patient was advised to call back or seek an in-person evaluation if the symptoms worsen or if the condition fails to improve as anticipated.   Nani Gasser, MD

## 2023-09-11 DIAGNOSIS — J449 Chronic obstructive pulmonary disease, unspecified: Secondary | ICD-10-CM | POA: Diagnosis not present

## 2023-09-15 ENCOUNTER — Other Ambulatory Visit: Payer: Self-pay | Admitting: Family Medicine

## 2023-09-15 DIAGNOSIS — J4489 Other specified chronic obstructive pulmonary disease: Secondary | ICD-10-CM

## 2023-09-22 ENCOUNTER — Ambulatory Visit: Payer: Self-pay

## 2023-09-22 NOTE — Patient Instructions (Signed)
Visit Information  Thank you for taking time to visit with me today. Please don't hesitate to contact me if I can be of assistance to you.   Following are the goals we discussed today:  Obtain Blood pressure monitor and pulse oximeter Take medications as prescribed. Contact provider with questions or concern Continue to attend provider visits as scheduled Continue to eat healthy, lean meats, vegetables, fruits, avoid saturated and transfats   Our next appointment is by telephone on 1030/24 at 2:45 pm  Please call the care guide team at [phone] if you need to cancel or reschedule your appointment.   If you are experiencing a Mental Health or Behavioral Health Crisis or need someone to talk to, please call the Suicide and Crisis Lifeline: 988 call the [place] National Suicide Prevention Lifeline: (773)744-9939 or TTY: 847-783-8307 TTY 320-014-6290) to talk to a trained counselor call 1-800-273-TALK (toll free, 24 hour hotline)  Kathyrn Sheriff, RN, MSN, BSN, CCM Care Management Coordinator [phone]

## 2023-09-22 NOTE — Patient Outreach (Signed)
Care Coordination   Follow Up Visit Note   09/22/2023 Name: Krystal Burgess MRN: [id number] DOB: 25-Mar-1961  Krystal Burgess is a 62 y.o. year old female who sees Metheney, Barbarann Ehlers, MD for primary care. I spoke with  Krystal Burgess by phone today.  What matters to the patients health and wellness today?  Reports improved breathing since office visit with PCP on 09/08/23, Krystal Burgess reports has not obtained BP monitor and is also in need of a pulse oximeter. She states she is aware that she has an OTC benefit with insurance plan. She acknowledges that BP monitor and pulse oximeter will help her manage her health and is going to obtain.  Krystal Burgess reports that she restarted the amlodipine about 2 days ago due to symptoms of feeling jittery and racing heart. She has not informed primary care and request RNCM update PCP.   Goals Addressed             This Visit's Progress    Assist with health Management       Interventions Today    Flowsheet Row Most Recent Value  Chronic Disease   Chronic disease during today's visit Hypertension (HTN), Chronic Obstructive Pulmonary Disease (COPD)  General Interventions   General Interventions Discussed/Reviewed General Interventions Reviewed, Communication with  Communication with PCP/Specialists  [re: patient medications: restart of amlodipine]  Education Interventions   Education Provided Provided Education  [facility] reinforced the importance of checking BP and the need to discuss any health/medicatiion questions or concerns with her provider before re-starting or stopping medications.]  Provided Verbal Education On Mental Health/Coping with Illness, When to see the doctor, Medication, Insurance Plans  [advised to attend provider visits as scheduled, encouraged to contact provider for health questions or concerns. discussed OTC benefit for BP monitor and pulse oximeter with patient.]  Pharmacy Interventions   Pharmacy Dicussed/Reviewed Pharmacy  Topics Reviewed            SDOH assessments and interventions completed:  No  Care Coordination Interventions:  Yes, provided   Follow up plan: Follow up call scheduled for 10/21/23    Encounter Outcome:  Patient Visit Completed   Kathyrn Sheriff, RN, MSN, BSN, CCM Care Management Coordinator [phone]

## 2023-09-23 DIAGNOSIS — G894 Chronic pain syndrome: Secondary | ICD-10-CM | POA: Diagnosis not present

## 2023-09-23 DIAGNOSIS — M542 Cervicalgia: Secondary | ICD-10-CM | POA: Diagnosis not present

## 2023-10-05 ENCOUNTER — Encounter: Payer: Self-pay | Admitting: Family Medicine

## 2023-10-06 MED ORDER — PREDNISONE 20 MG PO TABS: 40.0000 mg | ORAL_TABLET | Freq: Every day | ORAL | 0 refills | Status: DC

## 2023-10-06 NOTE — Telephone Encounter (Signed)
Meds ordered this encounter  Medications   predniSONE (DELTASONE) 20 MG tablet    Sig: Take 2 tablets (40 mg total) by mouth daily with breakfast.    Dispense:  10 tablet    Refill:  0

## 2023-10-11 DIAGNOSIS — J449 Chronic obstructive pulmonary disease, unspecified: Secondary | ICD-10-CM | POA: Diagnosis not present

## 2023-10-11 DIAGNOSIS — J441 Chronic obstructive pulmonary disease with (acute) exacerbation: Secondary | ICD-10-CM | POA: Diagnosis not present

## 2023-10-12 ENCOUNTER — Encounter: Payer: Self-pay | Admitting: Family Medicine

## 2023-10-13 ENCOUNTER — Telehealth: Payer: Self-pay | Admitting: *Deleted

## 2023-10-13 NOTE — Progress Notes (Signed)
Care Coordination Note  10/13/2023 Name: VIANY KAMAI MRN: [id number] DOB: [birth year]  Krystal Burgess is a 62 y.o. year old female who is a primary care patient of Agapito Games, MD and is actively engaged with the care management team. I reached out to [name] by phone today to assist with re-scheduling a follow up visit with the RN Case Manager  Follow up plan: Unsuccessful telephone outreach attempt made. A HIPAA compliant phone message was left for the patient providing contact information and requesting a return call.   Burman Nieves, CCMA Care Coordination Care Guide Direct Dial: [phone]

## 2023-10-14 ENCOUNTER — Institutional Professional Consult (permissible substitution): Payer: 59 | Admitting: Pulmonary Disease

## 2023-10-16 ENCOUNTER — Other Ambulatory Visit: Payer: Self-pay | Admitting: Family Medicine

## 2023-10-16 DIAGNOSIS — K2100 Gastro-esophageal reflux disease with esophagitis, without bleeding: Secondary | ICD-10-CM

## 2023-10-16 NOTE — Progress Notes (Signed)
Care Coordination Note  10/16/2023 Name: Krystal Burgess MRN: [id number] DOB: 05-Dec-1961  Krystal Burgess is a 62 y.o. year old female who is a primary care patient of Agapito Games, MD and is actively engaged with the care management team. I reached out to [name] by phone today to assist with re-scheduling a follow up visit with the RN Case Manager  Follow up plan: Patient declines further follow up and engagement by the care management team. Appropriate care team members and provider have been notified via electronic communication.   Burman Nieves, CCMA Care Coordination Care Guide Direct Dial: [phone]

## 2023-10-19 ENCOUNTER — Other Ambulatory Visit: Payer: Self-pay | Admitting: *Deleted

## 2023-10-19 DIAGNOSIS — I10 Essential (primary) hypertension: Secondary | ICD-10-CM

## 2023-10-19 DIAGNOSIS — E782 Mixed hyperlipidemia: Secondary | ICD-10-CM

## 2023-10-19 DIAGNOSIS — E039 Hypothyroidism, unspecified: Secondary | ICD-10-CM

## 2023-10-19 DIAGNOSIS — E119 Type 2 diabetes mellitus without complications: Secondary | ICD-10-CM

## 2023-10-20 ENCOUNTER — Telehealth (INDEPENDENT_AMBULATORY_CARE_PROVIDER_SITE_OTHER): Payer: 59 | Admitting: Family Medicine

## 2023-10-20 ENCOUNTER — Telehealth: Payer: Self-pay | Admitting: Family Medicine

## 2023-10-20 ENCOUNTER — Encounter: Payer: Self-pay | Admitting: Family Medicine

## 2023-10-20 DIAGNOSIS — J329 Chronic sinusitis, unspecified: Secondary | ICD-10-CM | POA: Diagnosis not present

## 2023-10-20 DIAGNOSIS — Z79891 Long term (current) use of opiate analgesic: Secondary | ICD-10-CM

## 2023-10-20 DIAGNOSIS — J40 Bronchitis, not specified as acute or chronic: Secondary | ICD-10-CM | POA: Diagnosis not present

## 2023-10-20 DIAGNOSIS — J441 Chronic obstructive pulmonary disease with (acute) exacerbation: Secondary | ICD-10-CM | POA: Diagnosis not present

## 2023-10-20 MED ORDER — CEFDINIR 300 MG PO CAPS: 300.0000 mg | ORAL_CAPSULE | Freq: Two times a day (BID) | ORAL | 0 refills | Status: DC

## 2023-10-20 MED ORDER — PREDNISONE 20 MG PO TABS: 40.0000 mg | ORAL_TABLET | Freq: Every day | ORAL | 0 refills | Status: DC

## 2023-10-20 NOTE — Progress Notes (Signed)
Virtual Visit via Video Note  I connected with Krystal Burgess on 10/20/23 at 11:30 AM EDT by a video enabled telemedicine application and verified that I am speaking with the correct person using two identifiers.   I discussed the limitations of evaluation and management by telemedicine and the availability of in person appointments. The patient expressed understanding and agreed to proceed.  Patient location: at home Provider location: in office  Subjective:    CC:  No chief complaint on file.   HPI: Sxs began Friday night (5 days) . Saturday she stated that her sxs got worse she got diarrhea and her throat started hurting.  He is also vomited.   She is now feeling worse. She is experiencing SOB on exertion.  She believes that she has a temperature however she doesn't have a thermometer to check this.  Sputum is dark green-colored.   She has been taking an OTC cold/flu med, immodium, zofran, and pepto bismuth.  She is belching a lot and mostly eating toast.   2 family members sick as well.  They are neg for COVID and flui.    Unable to make her pulmonology appointment because her daughter who is her transportation was sick.  She needs to call them back to get on the wait list but wondered if she could get in with a different pulmonary office a little bit sooner rather than later.  Past medical history, Surgical history, Family history not pertinant except as noted below, Social history, Allergies, and medications have been entered into the medical record, reviewed, and corrections made.    Objective:    General: Speaking clearly in complete sentences without any shortness of breath.  Alert and oriented x3.  Normal judgment. No apparent acute distress.    Impression and Recommendations:    Problem List Items Addressed This Visit       Respiratory   COPD exacerbation (HCC)   Relevant Medications   predniSONE (DELTASONE) 20 MG tablet   cefdinir (OMNICEF) 300 MG  capsule   Other Visit Diagnoses     Sinobronchitis    -  Primary   Relevant Medications   predniSONE (DELTASONE) 20 MG tablet   cefdinir (OMNICEF) 300 MG capsule      Sinobronchitis with COPD exacerbation-we will go ahead and treat with Omnicef.  I did send over prednisone that she could start in the next couple days if she feels more short of breath.  Does had a round of steroids about 2 weeks ago so really want to keep that steroid use to a minimum is much as possible.  She is going try to call pulmonary back and try to get on a wait list to see if she might be able to get in sooner since she did have to reschedule after missing her appointment.  Also reminded her to get back in for her blood work if at all possible.   She is also checked with her insurance on an [employer], [employer].  They work exclusively with [employer] she wondered if the visit could be done in person or via virtual visit I told her that I am pretty sure it had to be an in person visit.  But she could always call them and find out.    No orders of the defined types were placed in this encounter.   Meds ordered this encounter  Medications   predniSONE (DELTASONE) 20 MG tablet    Sig: Take 2 tablets (40 mg total)  by mouth daily with breakfast for 14 days.    Dispense:  10 tablet    Refill:  0   cefdinir (OMNICEF) 300 MG capsule    Sig: Take 1 capsule (300 mg total) by mouth 2 (two) times daily.    Dispense:  14 capsule    Refill:  0     I discussed the assessment and treatment plan with the patient. The patient was provided an opportunity to ask questions and all were answered. The patient agreed with the plan and demonstrated an understanding of the instructions.   The patient was advised to call back or seek an in-person evaluation if the symptoms worsen or if the condition fails to improve as anticipated.   Nani Gasser, MD

## 2023-10-20 NOTE — Progress Notes (Signed)
Sxs began Friday night. Saturday she stated that her sxs got worse she got diarrhea and her throat started hurting.   She is now feeling worse. She is experiencing SOB on exertion.  She believes that she has a temperature however she doesn't have a thermometer to check this.    She has been taking an OTC cold/flu med, immodium, zofran, and pepto bismuth.

## 2023-10-20 NOTE — Telephone Encounter (Signed)
Do know who in the local area might have as shorter wait time to get in with pulmonary?

## 2023-10-21 ENCOUNTER — Ambulatory Visit: Payer: Self-pay

## 2023-10-21 NOTE — Telephone Encounter (Signed)
[name], we can try Dr. Bethanie Dicker at [facility] or Dr. Randolm Idol at [facility].   Pt has been dismissed from [facility] due to 7 cancelled appointments.

## 2023-10-21 NOTE — Telephone Encounter (Signed)
Awesome Thank you!!!  Orders Placed This Encounter  Procedures   Ambulatory referral to Pulmonology    Referral Priority:   Routine    Referral Type:   Consultation    Referral Reason:   Specialty Services Required    Requested Specialty:   Pulmonary Disease    Number of Visits Requested:   1

## 2023-10-21 NOTE — Telephone Encounter (Signed)
Referral, clinical notes, demographics and copies of insurance cards have been faxed to [facility] at [phone]. Office will contact patient to schedule referral appointment.   Message sent to [place] for assist with scheduling referrals.

## 2023-10-21 NOTE — Patient Outreach (Signed)
Care Coordination   Case Closure  Visit Note   10/21/2023 Name: Krystal Burgess MRN: [id number] DOB: May 17, 1961  Krystal Burgess is a 62 y.o. year old female who sees Metheney, Barbarann Ehlers, MD for primary care.   What matters to the patients health and wellness today?  RNCM received message from care guide. Patient declines further participation. Case Closed.   Goals Addressed             This Visit's Progress    COMPLETED: Assist with health Management       Interventions Today    Flowsheet Row Most Recent Value  General Interventions   General Interventions Discussed/Reviewed General Interventions Reviewed  ["Patient declines further particpation". Case Closed]            SDOH assessments and interventions completed:  No  Care Coordination Interventions:  No, not indicated   Follow up plan: No further intervention required.   Encounter Outcome:  Patient Visit Completed   Kathyrn Sheriff, RN, MSN, BSN, CCM Care Management Coordinator [phone]

## 2023-10-22 ENCOUNTER — Encounter: Payer: Self-pay | Admitting: Family Medicine

## 2023-10-22 DIAGNOSIS — J40 Bronchitis, not specified as acute or chronic: Secondary | ICD-10-CM

## 2023-10-22 DIAGNOSIS — J441 Chronic obstructive pulmonary disease with (acute) exacerbation: Secondary | ICD-10-CM

## 2023-10-23 MED ORDER — PREDNISONE 20 MG PO TABS
40.0000 mg | ORAL_TABLET | Freq: Every day | ORAL | 0 refills | Status: AC
Start: 2023-10-23 — End: 2023-10-28

## 2023-10-24 ENCOUNTER — Other Ambulatory Visit: Payer: Self-pay | Admitting: Family Medicine

## 2023-10-25 ENCOUNTER — Other Ambulatory Visit: Payer: Self-pay | Admitting: Family Medicine

## 2023-10-25 DIAGNOSIS — J4489 Other specified chronic obstructive pulmonary disease: Secondary | ICD-10-CM

## 2023-10-26 ENCOUNTER — Encounter: Payer: Self-pay | Admitting: Family Medicine

## 2023-10-26 NOTE — Telephone Encounter (Signed)
See other MyChart message

## 2023-10-27 DIAGNOSIS — M542 Cervicalgia: Secondary | ICD-10-CM | POA: Diagnosis not present

## 2023-10-27 DIAGNOSIS — G894 Chronic pain syndrome: Secondary | ICD-10-CM | POA: Diagnosis not present

## 2023-10-27 NOTE — Addendum Note (Signed)
Addended by: Nani Gasser D on: 10/27/2023 01:15 PM   Modules accepted: Orders

## 2023-10-27 NOTE — Telephone Encounter (Signed)
Then we need to get a chest x-ray.  Orders Placed This Encounter  Procedures   DG Chest 2 View    Standing Status:   Future    Standing Expiration Date:   10/26/2024    Order Specific Question:   Reason for Exam (SYMPTOM  OR DIAGNOSIS REQUIRED)    Answer:   cough, geteting worse    Order Specific Question:   Is patient pregnant?    Answer:   No    Order Specific Question:   Preferred imaging location?    Answer:   [facility]

## 2023-10-28 ENCOUNTER — Other Ambulatory Visit: Payer: Self-pay | Admitting: Family Medicine

## 2023-10-28 MED ORDER — AMBULATORY NON FORMULARY MEDICATION: 0 refills | Status: DC

## 2023-10-28 NOTE — Addendum Note (Signed)
Addended by: Chalmers Cater on: 10/28/2023 11:17 AM   Modules accepted: Orders

## 2023-10-28 NOTE — Telephone Encounter (Signed)
Cna we try to get her ijn somewhere else? They probably won't see her anymore

## 2023-10-28 NOTE — Telephone Encounter (Signed)
Placed prescriptions in your basket.

## 2023-10-29 ENCOUNTER — Encounter: Payer: Self-pay | Admitting: Family Medicine

## 2023-10-29 ENCOUNTER — Telehealth: Payer: Self-pay | Admitting: Family Medicine

## 2023-10-29 NOTE — Telephone Encounter (Signed)
This was faxed to [facility]. Confirmation received

## 2023-10-29 NOTE — Telephone Encounter (Signed)
Patient called requesting an update on new oxygen equipment. Patient's stated her equipment is no longer working and is unable to go anywhere due to this.She is scheduled fro an MRI on 11/10/23 and will need this equipment prior. Please advise.

## 2023-11-05 NOTE — Telephone Encounter (Signed)
FAxed to [facility].

## 2023-11-11 DIAGNOSIS — J449 Chronic obstructive pulmonary disease, unspecified: Secondary | ICD-10-CM | POA: Diagnosis not present

## 2023-11-12 ENCOUNTER — Encounter: Payer: Self-pay | Admitting: Family Medicine

## 2023-11-12 ENCOUNTER — Telehealth: Payer: 59 | Admitting: Family Medicine

## 2023-11-12 DIAGNOSIS — J329 Chronic sinusitis, unspecified: Secondary | ICD-10-CM

## 2023-11-12 DIAGNOSIS — I10 Essential (primary) hypertension: Secondary | ICD-10-CM | POA: Diagnosis not present

## 2023-11-12 DIAGNOSIS — E119 Type 2 diabetes mellitus without complications: Secondary | ICD-10-CM

## 2023-11-12 DIAGNOSIS — J441 Chronic obstructive pulmonary disease with (acute) exacerbation: Secondary | ICD-10-CM | POA: Diagnosis not present

## 2023-11-12 DIAGNOSIS — M7989 Other specified soft tissue disorders: Secondary | ICD-10-CM

## 2023-11-12 MED ORDER — FUROSEMIDE 20 MG PO TABS: 20.0000 mg | ORAL_TABLET | Freq: Every day | ORAL | 0 refills | Status: DC | PRN

## 2023-11-12 MED ORDER — FLUCONAZOLE 150 MG PO TABS: 150.0000 mg | ORAL_TABLET | Freq: Once | ORAL | 0 refills | Status: DC

## 2023-11-12 MED ORDER — PREDNISONE 20 MG PO TABS: 40.0000 mg | ORAL_TABLET | Freq: Every day | ORAL | 0 refills | Status: DC

## 2023-11-12 MED ORDER — AMOXICILLIN-POT CLAVULANATE 875-125 MG PO TABS: 1.0000 | ORAL_TABLET | Freq: Two times a day (BID) | ORAL | 0 refills | Status: DC

## 2023-11-12 MED ORDER — AZELASTINE HCL 0.1 % NA SOLN: 2.0000 | Freq: Two times a day (BID) | NASAL | 12 refills | Status: DC

## 2023-11-12 MED ORDER — FLUCONAZOLE 150 MG PO TABS: 150.0000 mg | ORAL_TABLET | Freq: Every day | ORAL | 0 refills | Status: DC | PRN

## 2023-11-12 NOTE — Progress Notes (Signed)
Virtual Visit via Telephone Note  I connected with Krystal Burgess on 11/12/23 at  1:00 PM EST by telephone and verified that I am speaking with the correct person using two identifiers.   I discussed the limitations, risks, security and privacy concerns of performing an evaluation and management service by telephone and the availability of in person appointments. I also discussed with the patient that there may be a patient responsible charge related to this service. The patient expressed understanding and agreed to proceed.  Patient location: at home  Provider loccation: In office   Subjective:    CC:  No chief complaint on file.   HPI: He did hear from [facility] about scheduling a pulmonology appointment but has not actually made the appointment she does have some questions about her oxygen but says she will address that with them.  She says she still has a lot of sinus congestion and drainage and just feels like it is thick and not breaking up.  Taking mucinex. Sputum mo Not as green as it was. No fever. Vomited today.  Though wonders if one of the other family members might have a stomach bug as well.  Some ear pain.    Reports that her feet are swelling again it is just started recently mostly on the tops of her feet and around her ankles.  She had issues with this years ago in fact the last time we had prescribed furosemide was back in 2019 but she would like a refill on the medication she still has not gone for her updated labs.  Past medical history, Surgical history, Family history not pertinant except as noted below, Social history, Allergies, and medications have been entered into the medical record, reviewed, and corrections made.   Review of Systems: No fevers, chills, night sweats, weight loss, chest pain, or shortness of breath.   Objective:    General: Speaking clearly in complete sentences without any shortness of breath.  Alert and oriented x3.  Normal  judgment. No apparent acute distress.    Impression and Recommendations:    Problem List Items Addressed This Visit       Cardiovascular and Mediastinum   Essential hypertension    Reminded her due for labs.        Relevant Medications   furosemide (LASIX) 20 MG tablet   Other Relevant Orders   CMP14+EGFR   Lipid panel   CBC   Hemoglobin A1c   TSH     Respiratory   Recurrent sinusitis   Relevant Medications   azelastine (ASTELIN) 0.1 % nasal spray   amoxicillin-clavulanate (AUGMENTIN) 875-125 MG tablet   predniSONE (DELTASONE) 20 MG tablet   fluconazole (DIFLUCAN) 150 MG tablet   COPD exacerbation (HCC) - Primary    Will tx with augmentin. Did send in prednisone but asked her not to take it unless not improving.  Continue to use Nebs scheduled.        Relevant Medications   azelastine (ASTELIN) 0.1 % nasal spray   predniSONE (DELTASONE) 20 MG tablet   Other Relevant Orders   DG Chest 2 View     Endocrine   Type 2 diabetes mellitus (HCC)   Relevant Orders   CMP14+EGFR   Lipid panel   CBC   Hemoglobin A1c   TSH   Other Visit Diagnoses     Bilateral swelling of feet       Relevant Medications   furosemide (LASIX) 20 MG tablet  Recurrent sinusitis -biggest issue is her sinuses more so than her chest right now we will go ahead and treat with Augmentin it does not give her yeast infection so I did send over Diflucan to start if she feels symptoms and then keep the second tab for when she  Meds ordered this encounter  Medications   azelastine (ASTELIN) 0.1 % nasal spray    Sig: Place 2 sprays into both nostrils 2 (two) times daily. Use in each nostril as directed    Dispense:  30 mL    Refill:  12   amoxicillin-clavulanate (AUGMENTIN) 875-125 MG tablet    Sig: Take 1 tablet by mouth 2 (two) times daily.    Dispense:  20 tablet    Refill:  0   DISCONTD: fluconazole (DIFLUCAN) 150 MG tablet    Sig: Take 1 tablet (150 mg total) by mouth once for 1  dose.    Dispense:  1 tablet    Refill:  0   predniSONE (DELTASONE) 20 MG tablet    Sig: Take 2 tablets (40 mg total) by mouth daily with breakfast.    Dispense:  10 tablet    Refill:  0   furosemide (LASIX) 20 MG tablet    Sig: Take 1 tablet (20 mg total) by mouth daily as needed.    Dispense:  15 tablet    Refill:  0   fluconazole (DIFLUCAN) 150 MG tablet    Sig: Take 1 tablet (150 mg total) by mouth daily as needed.    Dispense:  2 tablet    Refill:  0    Meds ordered this encounter  Medications   azelastine (ASTELIN) 0.1 % nasal spray    Sig: Place 2 sprays into both nostrils 2 (two) times daily. Use in each nostril as directed    Dispense:  30 mL    Refill:  12   amoxicillin-clavulanate (AUGMENTIN) 875-125 MG tablet    Sig: Take 1 tablet by mouth 2 (two) times daily.    Dispense:  20 tablet    Refill:  0   DISCONTD: fluconazole (DIFLUCAN) 150 MG tablet    Sig: Take 1 tablet (150 mg total) by mouth once for 1 dose.    Dispense:  1 tablet    Refill:  0   predniSONE (DELTASONE) 20 MG tablet    Sig: Take 2 tablets (40 mg total) by mouth daily with breakfast.    Dispense:  10 tablet    Refill:  0   furosemide (LASIX) 20 MG tablet    Sig: Take 1 tablet (20 mg total) by mouth daily as needed.    Dispense:  15 tablet    Refill:  0   fluconazole (DIFLUCAN) 150 MG tablet    Sig: Take 1 tablet (150 mg total) by mouth daily as needed.    Dispense:  2 tablet    Refill:  0     I discussed the assessment and treatment plan with the patient. The patient was provided an opportunity to ask questions and all were answered. The patient agreed with the plan and demonstrated an understanding of the instructions.   The patient was advised to call back or seek an in-person evaluation if the symptoms worsen or if the condition fails to improve as anticipated.  I provided 20 minutes of non-face-to-face time during this encounter.   Nani Gasser, MD

## 2023-11-12 NOTE — Assessment & Plan Note (Signed)
Reminded her due for labs.

## 2023-11-12 NOTE — Assessment & Plan Note (Signed)
Will tx with augmentin. Did send in prednisone but asked her not to take it unless not improving.  Continue to use Nebs scheduled.

## 2023-11-20 ENCOUNTER — Other Ambulatory Visit: Payer: Self-pay | Admitting: Family Medicine

## 2023-11-20 DIAGNOSIS — M7989 Other specified soft tissue disorders: Secondary | ICD-10-CM

## 2023-11-21 ENCOUNTER — Other Ambulatory Visit: Payer: Self-pay | Admitting: Family Medicine

## 2023-11-22 ENCOUNTER — Other Ambulatory Visit: Payer: Self-pay | Admitting: Physician Assistant

## 2023-11-22 DIAGNOSIS — J0100 Acute maxillary sinusitis, unspecified: Secondary | ICD-10-CM

## 2023-11-26 DIAGNOSIS — G894 Chronic pain syndrome: Secondary | ICD-10-CM | POA: Diagnosis not present

## 2023-11-26 DIAGNOSIS — M5416 Radiculopathy, lumbar region: Secondary | ICD-10-CM | POA: Diagnosis not present

## 2023-11-26 DIAGNOSIS — M51362 Other intervertebral disc degeneration, lumbar region with discogenic back pain and lower extremity pain: Secondary | ICD-10-CM | POA: Diagnosis not present

## 2023-12-10 ENCOUNTER — Other Ambulatory Visit: Payer: Self-pay | Admitting: Family Medicine

## 2023-12-10 DIAGNOSIS — R6520 Severe sepsis without septic shock: Secondary | ICD-10-CM | POA: Diagnosis not present

## 2023-12-10 DIAGNOSIS — J441 Chronic obstructive pulmonary disease with (acute) exacerbation: Secondary | ICD-10-CM | POA: Diagnosis not present

## 2023-12-10 DIAGNOSIS — E785 Hyperlipidemia, unspecified: Secondary | ICD-10-CM | POA: Diagnosis not present

## 2023-12-10 DIAGNOSIS — A419 Sepsis, unspecified organism: Secondary | ICD-10-CM | POA: Diagnosis not present

## 2023-12-10 DIAGNOSIS — Z79899 Other long term (current) drug therapy: Secondary | ICD-10-CM | POA: Diagnosis not present

## 2023-12-10 DIAGNOSIS — R0989 Other specified symptoms and signs involving the circulatory and respiratory systems: Secondary | ICD-10-CM | POA: Diagnosis not present

## 2023-12-10 DIAGNOSIS — R918 Other nonspecific abnormal finding of lung field: Secondary | ICD-10-CM | POA: Diagnosis not present

## 2023-12-10 DIAGNOSIS — J9621 Acute and chronic respiratory failure with hypoxia: Secondary | ICD-10-CM | POA: Diagnosis not present

## 2023-12-10 DIAGNOSIS — Z743 Need for continuous supervision: Secondary | ICD-10-CM | POA: Diagnosis not present

## 2023-12-10 DIAGNOSIS — Z7401 Bed confinement status: Secondary | ICD-10-CM | POA: Diagnosis not present

## 2023-12-10 DIAGNOSIS — Z9981 Dependence on supplemental oxygen: Secondary | ICD-10-CM | POA: Diagnosis not present

## 2023-12-10 DIAGNOSIS — R0602 Shortness of breath: Secondary | ICD-10-CM | POA: Diagnosis not present

## 2023-12-10 DIAGNOSIS — K219 Gastro-esophageal reflux disease without esophagitis: Secondary | ICD-10-CM | POA: Diagnosis not present

## 2023-12-10 DIAGNOSIS — R7989 Other specified abnormal findings of blood chemistry: Secondary | ICD-10-CM | POA: Diagnosis not present

## 2023-12-10 DIAGNOSIS — I3481 Nonrheumatic mitral (valve) annulus calcification: Secondary | ICD-10-CM | POA: Diagnosis not present

## 2023-12-10 DIAGNOSIS — R6889 Other general symptoms and signs: Secondary | ICD-10-CM | POA: Diagnosis not present

## 2023-12-10 DIAGNOSIS — J440 Chronic obstructive pulmonary disease with acute lower respiratory infection: Secondary | ICD-10-CM | POA: Diagnosis not present

## 2023-12-10 DIAGNOSIS — R54 Age-related physical debility: Secondary | ICD-10-CM | POA: Diagnosis not present

## 2023-12-10 DIAGNOSIS — Z7951 Long term (current) use of inhaled steroids: Secondary | ICD-10-CM | POA: Diagnosis not present

## 2023-12-10 DIAGNOSIS — Z1621 Resistance to vancomycin: Secondary | ICD-10-CM | POA: Diagnosis not present

## 2023-12-10 DIAGNOSIS — Z87891 Personal history of nicotine dependence: Secondary | ICD-10-CM | POA: Diagnosis not present

## 2023-12-10 DIAGNOSIS — Z1152 Encounter for screening for COVID-19: Secondary | ICD-10-CM | POA: Diagnosis not present

## 2023-12-10 DIAGNOSIS — A4102 Sepsis due to Methicillin resistant Staphylococcus aureus: Secondary | ICD-10-CM | POA: Diagnosis not present

## 2023-12-10 DIAGNOSIS — E039 Hypothyroidism, unspecified: Secondary | ICD-10-CM | POA: Diagnosis not present

## 2023-12-10 DIAGNOSIS — R0902 Hypoxemia: Secondary | ICD-10-CM | POA: Diagnosis not present

## 2023-12-10 DIAGNOSIS — E079 Disorder of thyroid, unspecified: Secondary | ICD-10-CM | POA: Diagnosis not present

## 2023-12-10 DIAGNOSIS — J45909 Unspecified asthma, uncomplicated: Secondary | ICD-10-CM | POA: Diagnosis not present

## 2023-12-10 DIAGNOSIS — I10 Essential (primary) hypertension: Secondary | ICD-10-CM | POA: Diagnosis not present

## 2023-12-10 DIAGNOSIS — Z79891 Long term (current) use of opiate analgesic: Secondary | ICD-10-CM | POA: Diagnosis not present

## 2023-12-10 DIAGNOSIS — I517 Cardiomegaly: Secondary | ICD-10-CM | POA: Diagnosis not present

## 2023-12-10 DIAGNOSIS — J449 Chronic obstructive pulmonary disease, unspecified: Secondary | ICD-10-CM | POA: Diagnosis not present

## 2023-12-10 DIAGNOSIS — N179 Acute kidney failure, unspecified: Secondary | ICD-10-CM | POA: Diagnosis not present

## 2023-12-10 DIAGNOSIS — R410 Disorientation, unspecified: Secondary | ICD-10-CM | POA: Diagnosis not present

## 2023-12-10 DIAGNOSIS — Z5911 Inadequate housing environmental temperature: Secondary | ICD-10-CM | POA: Diagnosis not present

## 2023-12-11 DIAGNOSIS — J9621 Acute and chronic respiratory failure with hypoxia: Secondary | ICD-10-CM | POA: Diagnosis not present

## 2023-12-11 DIAGNOSIS — Z79899 Other long term (current) drug therapy: Secondary | ICD-10-CM | POA: Diagnosis not present

## 2023-12-11 DIAGNOSIS — K219 Gastro-esophageal reflux disease without esophagitis: Secondary | ICD-10-CM | POA: Diagnosis not present

## 2023-12-11 DIAGNOSIS — A419 Sepsis, unspecified organism: Secondary | ICD-10-CM | POA: Diagnosis not present

## 2023-12-11 DIAGNOSIS — I10 Essential (primary) hypertension: Secondary | ICD-10-CM | POA: Diagnosis not present

## 2023-12-11 DIAGNOSIS — J449 Chronic obstructive pulmonary disease, unspecified: Secondary | ICD-10-CM | POA: Diagnosis not present

## 2023-12-11 DIAGNOSIS — I3481 Nonrheumatic mitral (valve) annulus calcification: Secondary | ICD-10-CM | POA: Diagnosis not present

## 2023-12-11 DIAGNOSIS — E039 Hypothyroidism, unspecified: Secondary | ICD-10-CM | POA: Diagnosis not present

## 2023-12-11 DIAGNOSIS — E785 Hyperlipidemia, unspecified: Secondary | ICD-10-CM | POA: Diagnosis not present

## 2023-12-11 DIAGNOSIS — R54 Age-related physical debility: Secondary | ICD-10-CM | POA: Diagnosis not present

## 2023-12-11 DIAGNOSIS — N179 Acute kidney failure, unspecified: Secondary | ICD-10-CM | POA: Diagnosis not present

## 2023-12-11 DIAGNOSIS — I517 Cardiomegaly: Secondary | ICD-10-CM | POA: Diagnosis not present

## 2023-12-12 DIAGNOSIS — E039 Hypothyroidism, unspecified: Secondary | ICD-10-CM | POA: Diagnosis not present

## 2023-12-12 DIAGNOSIS — R7989 Other specified abnormal findings of blood chemistry: Secondary | ICD-10-CM | POA: Diagnosis not present

## 2023-12-12 DIAGNOSIS — J441 Chronic obstructive pulmonary disease with (acute) exacerbation: Secondary | ICD-10-CM | POA: Diagnosis not present

## 2023-12-12 DIAGNOSIS — I10 Essential (primary) hypertension: Secondary | ICD-10-CM | POA: Diagnosis not present

## 2023-12-12 DIAGNOSIS — N179 Acute kidney failure, unspecified: Secondary | ICD-10-CM | POA: Diagnosis not present

## 2023-12-12 DIAGNOSIS — K219 Gastro-esophageal reflux disease without esophagitis: Secondary | ICD-10-CM | POA: Diagnosis not present

## 2023-12-12 DIAGNOSIS — R54 Age-related physical debility: Secondary | ICD-10-CM | POA: Diagnosis not present

## 2023-12-12 DIAGNOSIS — E785 Hyperlipidemia, unspecified: Secondary | ICD-10-CM | POA: Diagnosis not present

## 2023-12-12 DIAGNOSIS — A419 Sepsis, unspecified organism: Secondary | ICD-10-CM | POA: Diagnosis not present

## 2023-12-12 DIAGNOSIS — R6520 Severe sepsis without septic shock: Secondary | ICD-10-CM | POA: Diagnosis not present

## 2023-12-12 DIAGNOSIS — J9621 Acute and chronic respiratory failure with hypoxia: Secondary | ICD-10-CM | POA: Diagnosis not present

## 2023-12-13 DIAGNOSIS — A419 Sepsis, unspecified organism: Secondary | ICD-10-CM | POA: Diagnosis not present

## 2023-12-13 DIAGNOSIS — N179 Acute kidney failure, unspecified: Secondary | ICD-10-CM | POA: Diagnosis not present

## 2023-12-13 DIAGNOSIS — R6520 Severe sepsis without septic shock: Secondary | ICD-10-CM | POA: Diagnosis not present

## 2023-12-13 DIAGNOSIS — E039 Hypothyroidism, unspecified: Secondary | ICD-10-CM | POA: Diagnosis not present

## 2023-12-13 DIAGNOSIS — R7989 Other specified abnormal findings of blood chemistry: Secondary | ICD-10-CM | POA: Diagnosis not present

## 2023-12-13 DIAGNOSIS — J9621 Acute and chronic respiratory failure with hypoxia: Secondary | ICD-10-CM | POA: Diagnosis not present

## 2023-12-13 DIAGNOSIS — J441 Chronic obstructive pulmonary disease with (acute) exacerbation: Secondary | ICD-10-CM | POA: Diagnosis not present

## 2023-12-13 DIAGNOSIS — E785 Hyperlipidemia, unspecified: Secondary | ICD-10-CM | POA: Diagnosis not present

## 2023-12-13 DIAGNOSIS — I10 Essential (primary) hypertension: Secondary | ICD-10-CM | POA: Diagnosis not present

## 2023-12-13 DIAGNOSIS — K219 Gastro-esophageal reflux disease without esophagitis: Secondary | ICD-10-CM | POA: Diagnosis not present

## 2023-12-13 DIAGNOSIS — R54 Age-related physical debility: Secondary | ICD-10-CM | POA: Diagnosis not present

## 2023-12-15 DIAGNOSIS — E039 Hypothyroidism, unspecified: Secondary | ICD-10-CM | POA: Diagnosis not present

## 2023-12-15 DIAGNOSIS — R54 Age-related physical debility: Secondary | ICD-10-CM | POA: Diagnosis not present

## 2023-12-15 DIAGNOSIS — J441 Chronic obstructive pulmonary disease with (acute) exacerbation: Secondary | ICD-10-CM | POA: Diagnosis not present

## 2023-12-15 DIAGNOSIS — I10 Essential (primary) hypertension: Secondary | ICD-10-CM | POA: Diagnosis not present

## 2023-12-15 DIAGNOSIS — E785 Hyperlipidemia, unspecified: Secondary | ICD-10-CM | POA: Diagnosis not present

## 2023-12-15 DIAGNOSIS — N179 Acute kidney failure, unspecified: Secondary | ICD-10-CM | POA: Diagnosis not present

## 2023-12-15 DIAGNOSIS — R6520 Severe sepsis without septic shock: Secondary | ICD-10-CM | POA: Diagnosis not present

## 2023-12-15 DIAGNOSIS — K219 Gastro-esophageal reflux disease without esophagitis: Secondary | ICD-10-CM | POA: Diagnosis not present

## 2023-12-15 DIAGNOSIS — A419 Sepsis, unspecified organism: Secondary | ICD-10-CM | POA: Diagnosis not present

## 2023-12-15 DIAGNOSIS — J9621 Acute and chronic respiratory failure with hypoxia: Secondary | ICD-10-CM | POA: Diagnosis not present

## 2023-12-17 ENCOUNTER — Other Ambulatory Visit: Payer: Self-pay | Admitting: Family Medicine

## 2023-12-17 LAB — COMPREHENSIVE METABOLIC PANEL: eGFR: 33

## 2023-12-18 ENCOUNTER — Telehealth: Payer: Self-pay

## 2023-12-18 DIAGNOSIS — R7881 Bacteremia: Secondary | ICD-10-CM | POA: Diagnosis not present

## 2023-12-18 NOTE — Patient Outreach (Signed)
  Care Coordination   Follow Up Visit Note   12/18/2023 Name: Krystal Burgess MRN: [id number] DOB: Jul 10, 1961  Successful call to patient letting her know our care guide had rescheduled her appointment from 12/21/23 with the nurse practitioner, to 12/22/23 with [name] at 10:50.  Patient agreed to appointment and this RNCM will let her know when we hear back from [name] if the appointment can be a video visit.  Jodelle Gross RN, BSN, CCM RN Care Manager  Transitions of Care  [facility]  [phone]

## 2023-12-18 NOTE — Transitions of Care (Post Inpatient/ED Visit) (Signed)
12/18/2023  Name: Krystal Burgess MRN: [id number] DOB: 06-Jul-1961  Today's TOC FU Call Status: Today's TOC FU Call Status:: Successful TOC FU Call Completed TOC FU Call Complete Date: 12/18/23 Patient's Name and Date of Birth confirmed.  Transition Care Management Follow-up Telephone Call Date of Discharge: 12/17/23 Discharge Facility: Other [facility]) Name of Other ([facility]) Discharge Facility: [facility] Type of Discharge: Inpatient Admission Primary Inpatient Discharge Diagnosis:: Acute on Chronic Respiratory Failure How have you been since you were released from the hospital?: Better Any questions or concerns?: Yes Patient Questions/Concerns:: Patient would like to change her hospital follow up appointment to a video visit Patient Questions/Concerns Addressed: Notified Provider of Patient Questions/Concerns  Items Reviewed: Did you receive and understand the discharge instructions provided?: Yes Medications obtained,verified, and reconciled?: Yes (Medications Reviewed) Any new allergies since your discharge?: No Dietary orders reviewed?: No Do you have support at home?: Yes People in Home: child(ren), adult Name of Support/Comfort Primary Source: Nikki  Medications Reviewed Today: Medications Reviewed Today     Reviewed by Jodelle Gross, RN (Case Manager) on 12/18/23 at 1045  Med List Status: <None>   Medication Order Taking? Sig Documenting Provider Last Dose Status Informant  albuterol (VENTOLIN HFA) 108 (90 Base) MCG/ACT inhaler [id number] Yes INHALE 2 PUFFS INTO THE LUNGS EVERY 4 HOURS AS NEEDED FOR WHEEZING OR SHORTNESS OF BREATH. Agapito Games, MD Taking Active Pharmacy Records, Self  ALPRAZolam Prudy Feeler) 1 MG tablet [id number] Yes Take 1 mg by mouth 3 (three) times daily as needed for anxiety. [provider] Taking Active Self  Kipp Laurence MEDICATION [id number] Yes Battery powered portable oxygen concentrator Agapito Games, MD Taking Active   Kipp Laurence MEDICATION [id number] Yes Battery powered portable nebulizer Agapito Games, MD Taking Active   amLODipine (NORVASC) 5 MG tablet [id number] Yes Take 5 mg by mouth daily. [provider] Taking Active Self           Med Note Colletta Maryland   Tue Sep 22, 2023  1:54 PM) Patient reports started back 09/20/23  amoxicillin-clavulanate (AUGMENTIN) 875-125 MG tablet [id number] Yes Take 1 tablet by mouth 2 (two) times daily. Agapito Games, MD Taking Active   azelastine (ASTELIN) 0.1 % nasal spray [id number] Yes Place 2 sprays into both nostrils 2 (two) times daily. Use in each nostril as directed Agapito Games, MD Taking Active   Blood Glucose Monitoring Suppl DEVI [id number] Yes 1 each by Does not apply route daily as needed. May substitute to any manufacturer covered by patient's insurance. Agapito Games, MD Taking Active   buprenorphine Lavera Guise) 15 MCG/HR [id number] Yes Place 1 patch onto the skin once a week. [provider] Taking Active   fluconazole (DIFLUCAN) 150 MG tablet [id number] Yes Take 1 tablet (150 mg total) by mouth daily as needed. Agapito Games, MD Taking Active   fluticasone [facility]) 50 MCG/ACT nasal spray [id number] Yes SPRAY 2 SPRAYS INTO EACH NOSTRIL EVERY DAY Breeback, Jade L, PA-C Taking Active   folic acid (FOLVITE) 1 MG tablet [id number] Yes TAKE 1 TABLET BY MOUTH EVERY DAY Agapito Games, MD Taking Active   furosemide (LASIX) 20 MG tablet [id number] Yes Take 1 tablet (20 mg total) by mouth daily as needed. Agapito Games, MD Taking Active   gabapentin (NEURONTIN) 600 MG tablet [id number] Yes TAKE 2 TABLETS (1,200 MG TOTAL) BY MOUTH AT BEDTIME. PATIENT TAKES 1200 MG DAILY Agapito Games, MD Taking Active  guaiFENesin (MUCINEX) 600 MG 12 hr tablet [id number] Yes Take 600 mg by mouth 2 (two) times daily. [provider] Taking Active   ipratropium  (ATROVENT) 0.06 % nasal spray [id number] Yes Place 2 sprays into both nostrils 3 (three) times daily. Agapito Games, MD Taking Active            Med Note Colletta Maryland   Tue Sep 22, 2023  1:47 PM) Per patient Uses as needed  ipratropium-albuterol (DUONEB) 0.5-2.5 (3) MG/3ML SOLN [id number] Yes INHALE 3 MLS INTO THE LUNGS EVERY 2 (TWO) HOURS AS NEEDED (WHEEZING OR SHORTNESS OF BREATH). Agapito Games, MD Taking Active   levothyroxine (SYNTHROID) 88 MCG tablet [id number] Yes TAKE 1 TABLET BY MOUTH EVERY DAY Agapito Games, MD Taking Active   losartan (COZAAR) 100 MG tablet [id number] Yes TAKE 1 TABLET BY MOUTH EVERY DAY Agapito Games, MD Taking Active   ondansetron [facility]) 8 MG tablet [id number] Yes Take 1 tablet (8 mg total) by mouth every 8 (eight) hours as needed for nausea or vomiting. Catarina Hartshorn, MD Taking Active   oxyCODONE-acetaminophen (PERCOCET) 10-325 MG tablet [id number] Yes Take 1 tablet by mouth every 8 (eight) hours as needed for pain. Aletha Halim, MD Taking Active   pantoprazole (PROTONIX) 40 MG tablet [id number] Yes TAKE 1 TABLET BY MOUTH EVERY DAY Agapito Games, MD Taking Active   predniSONE (DELTASONE) 20 MG tablet [id number] Yes Take 2 tablets (40 mg total) by mouth daily with breakfast. Agapito Games, MD Taking Active   predniSONE (DELTASONE) 5 MG tablet [id number] Yes TAKE 1/2 TABLET (2.5 MG TOTAL) BY MOUTH DAILY WITH BREAKFAST. Agapito Games, MD Taking Active   rosuvastatin (CRESTOR) 20 MG tablet [id number] Yes Take 1 tablet (20 mg total) by mouth at bedtime. Agapito Games, MD Taking Active   theophylline (THEODUR) 300 MG 12 hr tablet [id number] Yes TAKE 1 TABLET BY MOUTH TWICE A DAY Agapito Games, MD Taking Active   tiZANidine (ZANAFLEX) 4 MG tablet [id number]  TAKE 1 TABLET BY MOUTH AT BEDTIME AS NEEDED FOR MUSCLE SPASMS. Agapito Games, MD  Active   traZODone (DESYREL) 50 MG tablet [id number] Yes Take 50 mg  by mouth at bedtime as needed for sleep. Aletha Halim, MD Taking Active            Med Note Colletta Maryland   Tue Sep 22, 2023  1:44 PM) Reports takes 150 mg every night  TRELEGY ELLIPTA 100-62.5-25 MCG/ACT AEPB [id number] Yes INHALE 1 PUFF BY MOUTH EVERY DAY Agapito Games, MD Taking Active   Vilazodone HCl 20 MG TABS [id number] Yes Take 20 mg by mouth daily. [provider] Taking Active             Home Care and Equipment/Supplies: Were Home Health Services Ordered?: Yes Name of Home Health Agency:: [facility] Has Agency set up a time to come to your home?: No EMR reviewed for Home Health Orders:  (Patient was dc yesterday and will call [facility] if she does not hear from them today) Any new equipment or medical supplies ordered?: Yes Name of Medical supply agency?: [facility] Were you able to get the equipment/medical supplies?: Yes Do you have any questions related to the use of the equipment/supplies?: No  Functional Questionnaire: Do you need assistance with bathing/showering or dressing?: Yes Do you need assistance with meal preparation?: Yes Do you need assistance with eating?: No Do you have difficulty maintaining continence:  No Do you need assistance with getting out of bed/getting out of a chair/moving?: No Do you have difficulty managing or taking your medications?: No  Follow up appointments reviewed: PCP Follow-up appointment confirmed?: Yes Date of PCP follow-up appointment?: 12/21/23 Follow-up Provider: Christen Butter  SDOH Interventions Today    Flowsheet Row Most Recent Value  SDOH Interventions   Food Insecurity Interventions Intervention Not Indicated  Housing Interventions Intervention Not Indicated  Transportation Interventions Intervention Not Indicated  Utilities Interventions Intervention Not Indicated     Jodelle Gross RN, BSN, CCM RN Care Manager  Transitions of Care  [facility]   [phone]

## 2023-12-21 ENCOUNTER — Inpatient Hospital Stay: Payer: 59 | Admitting: Medical-Surgical

## 2023-12-21 ENCOUNTER — Telehealth: Payer: Self-pay

## 2023-12-21 NOTE — Patient Outreach (Signed)
  Care Coordination   Follow Up Visit Note   12/21/2023 Name: Krystal Burgess MRN: [id number] DOB: 1961/03/04  Krystal Burgess is a 62 y.o. year old female who sees Metheney, Barbarann Ehlers, MD for primary care. I spoke with  [name] by phone today.  What matters to the patients health and wellness today?  Successful outreach to patient letting her know [name] agreed to her hospital follow up appointment on 12/22/23  at 10:50 can be a video visit.    Goals Addressed   None   SDOH assessments and interventions completed:  Yes  Care Coordination Interventions:  Yes, provided   Follow up plan: No further intervention required.   Encounter Outcome:  Patient Visit Completed  Jodelle Gross RN, BSN, CCM RN Care Manager  Transitions of Care  [facility]  [phone]

## 2023-12-22 ENCOUNTER — Telehealth: Payer: Self-pay | Admitting: Family Medicine

## 2023-12-22 ENCOUNTER — Encounter: Payer: Self-pay | Admitting: Family Medicine

## 2023-12-22 ENCOUNTER — Telehealth (INDEPENDENT_AMBULATORY_CARE_PROVIDER_SITE_OTHER): Payer: 59 | Admitting: Family Medicine

## 2023-12-22 DIAGNOSIS — J9611 Chronic respiratory failure with hypoxia: Secondary | ICD-10-CM

## 2023-12-22 DIAGNOSIS — R7989 Other specified abnormal findings of blood chemistry: Secondary | ICD-10-CM

## 2023-12-22 DIAGNOSIS — J9621 Acute and chronic respiratory failure with hypoxia: Secondary | ICD-10-CM | POA: Diagnosis not present

## 2023-12-22 DIAGNOSIS — N189 Chronic kidney disease, unspecified: Secondary | ICD-10-CM | POA: Diagnosis not present

## 2023-12-22 DIAGNOSIS — I10 Essential (primary) hypertension: Secondary | ICD-10-CM

## 2023-12-22 DIAGNOSIS — N179 Acute kidney failure, unspecified: Secondary | ICD-10-CM | POA: Diagnosis not present

## 2023-12-22 DIAGNOSIS — F332 Major depressive disorder, recurrent severe without psychotic features: Secondary | ICD-10-CM

## 2023-12-22 MED ORDER — TIZANIDINE HCL 4 MG PO TABS: 4.0000 mg | ORAL_TABLET | Freq: Two times a day (BID) | ORAL | 1 refills | Status: DC | PRN

## 2023-12-22 NOTE — Assessment & Plan Note (Signed)
She was previously on losartan they did discharge her on home on this.  We did discuss that it can also help protect her renal function.

## 2023-12-22 NOTE — Progress Notes (Signed)
 Virtual Visit via Video Note  I connected with Krystal Burgess on 12/22/23 at 10:50 AM EST by a video enabled telemedicine application and verified that I am speaking with the correct person using two identifiers.   I discussed the limitations of evaluation and management by telemedicine and the availability of in person appointments. The patient expressed understanding and agreed to proceed.  Patient location: at home Provider location: in office  Subjective:    CC:   Chief Complaint  Patient presents with   Hospitalization Follow-up    HPI: She is doing a virtual follow-up from recent hospital admission at [facility].  She was admitted on December 19 for shortness of breath.  She has a history of asthma COPD on 2 L of chronic oxygen  home therapy.  She had had significant shortness of breath for 3 to 4 days at that point was found to be hypoxic at 83% on 3 L.  Had not been taking her usual medications for few days because she felt so sick.  EMS was called and she was taken to the [facility] hospital.  She was admitted to the ICU and required BiPAP for bilateral pneumonia and COPD exacerbation.  She improved and was eventually moved to a regular room tolerating nasal oxygen  she was given IV Solu-Medrol  and nebulizer treatments.  They also did an echocardiogram showing possible pulmonary edema.  BNP was 16,000 and had come down to 2000.  EF was 70%.  She was given ceftriaxone  while in the hospital and discharged home on a 5-day course of azithromycin .  She was also diagnosed with sepsis with organ failure initial white blood cell count was 32,000 and creatinine had bumped to 3.62.  He had 1 positive blood culture for Staph aureus and vancomycin  was initiated.  They did hold her diclofenac  because of acute kidney injury again creatinine was 3.62 on admission and trended down to 1.84 at discharge.  They stopped her oxycodone  and changed her to butrans patch.    Feeling down. She is on Viibryd   and xanax  TID.  He has been out of the Viibryd  for 5 days.  They did not have it at the hospital so family member was bringing it to the hospital but in the hospital kept the rest of the bottle and did not get it back at discharge so she has been completely out she did try calling her psychiatrist office last week but has not heard from them.  She says she still has a PICC line in place for IV antibiotics and they are coming this afternoon around 3 PM to administer a dose.  She really wants the PICC line out.  Seeming that this was based on her blood culture that was +11 days ago for MRSA.  She actually had repeat blood cultures drawn on December 23 that were negative x 2.  Reports that she has been having some more muscle spasms since she has been home.  Like an increase on her muscle relaxer which she normally just takes at bedtime.  She has not been sleeping well she is that she is only had 1 night since she has been home where she got a decent amount of sleep.  Unfortunately had delirium while she was in the hospital.  Past medical history, Surgical history, Family history not pertinant except as noted below, Social history, Allergies, and medications have been entered into the medical record, reviewed, and corrections made.    Objective:    General:  Speaking clearly in complete sentences without any shortness of breath.  Alert and oriented x3.  Normal judgment. No apparent acute distress.    Impression and Recommendations:    Problem List Items Addressed This Visit       Cardiovascular and Mediastinum   Essential hypertension   She was previously on losartan  they did discharge her on home on this.  We did discuss that it can also help protect her renal function.      Relevant Orders   CMP14+EGFR   Urine Microalbumin w/creat. ratio   CBC with Differential/Platelet     Respiratory   Chronic respiratory failure with hypoxia (HCC)   Relevant Orders   CMP14+EGFR   Urine  Microalbumin w/creat. ratio   CBC with Differential/Platelet   Acute on chronic respiratory failure with hypoxia (HCC) - Primary   Relevant Orders   CMP14+EGFR   Urine Microalbumin w/creat. ratio   CBC with Differential/Platelet     Genitourinary   Acute kidney injury superimposed on chronic kidney disease (HCC)   Plan to recheck renal function next week.      Relevant Orders   CMP14+EGFR   Urine Microalbumin w/creat. ratio   CBC with Differential/Platelet     Other   MDD (major depressive disorder), recurrent episode, severe (HCC)   Will try to call the pharmacy and see if she might have some old refills left on the Viibryd  that she might be more to pick up until she hears back from the psychiatrist office.  If she does not have any then I can call in 2 weeks worth until she can get in with them.      Other Visit Diagnoses       AKI (acute kidney injury) (HCC)       Relevant Orders   CMP14+EGFR   Urine Microalbumin w/creat. ratio   CBC with Differential/Platelet     Blood culture positive for microorganism           Acute kidney injury-seem to have improved significantly at discharge she will need a repeat renal function in about a week.  It sounds like she has someone coming out for the IV infusion but has not heard from home health yet encouraged her to give them a call and if she is having any difficulty to reach back out I would like to have a nurse coming out there weekly as well as some formal PT to help her regain her strength.  Positive blood culture-they decided to treat her not knowing if  may have just been a contaminant.  And she does have a PICC line in place.  The initial culture was positive for MRSA.  She really wants the PICC line out and they are coming this afternoon.  Orders Placed This Encounter  Procedures   Comprehensive metabolic panel    This external order was created through the Results Console.   CMP14+EGFR   Urine Microalbumin w/creat. ratio    CBC with Differential/Platelet    Meds ordered this encounter  Medications   tiZANidine  (ZANAFLEX ) 4 MG tablet    Sig: Take 1 tablet (4 mg total) by mouth 2 (two) times daily as needed for muscle spasms.    Dispense:  45 tablet    Refill:  1     I discussed the assessment and treatment plan with the patient. The patient was provided an opportunity to ask questions and all were answered. The patient agreed with the plan and demonstrated an understanding of  the instructions.   The patient was advised to call back or seek an in-person evaluation if the symptoms worsen or if the condition fails to improve as anticipated.  I spent 40 minutes on the day of the encounter to include pre-visit record review, face-to-face time with the patient and post visit ordering of test.   [name], MD

## 2023-12-22 NOTE — Telephone Encounter (Signed)
 Jon called back and spoke directly with the patient again her daughter did end up giving her the injection in about an hour ago.  But the company that she is been using for the PICC line did not come out today as they were supposed to.  Also looks like none of the guaifenesin  products are actually covered by her insurance currently.

## 2023-12-22 NOTE — Assessment & Plan Note (Signed)
Plan to recheck renal function next week.

## 2023-12-22 NOTE — Telephone Encounter (Signed)
 Please call patient and let her know that I was able to find the blood culture in the system but it did not quantify the colony count.  It just that it was positive for MRSA and gave the specificity for the drugs that it should respond to.  Please see how many more days of PICC line and injection/infusion she supposed to receive?  And see if she knows what medicine specifically they are injecting.  Her discharge summary from the hospital unfortunately is not complete

## 2023-12-22 NOTE — Telephone Encounter (Signed)
 I called the pharmacy and she does have a prescription for Viibryd . However, it is not due to be filled until Friday. I advised patient and I said I would check on the price for a couple of pills to last until Friday. She states it doesn't matter if it only cost $2 she could not afford the medication. The pharmacy states they offered her the generic of Mucinex  and she refused because she couldn't afford the medication.   She states she is mad with her daughter and would not let her daughter give her the injectable antibiotic. She wanted to know if Dr Alvan has decided to switch her to oral antibiotic.

## 2023-12-22 NOTE — Telephone Encounter (Signed)
Patient advised.   [facility] services.   Phone [phone]

## 2023-12-22 NOTE — Telephone Encounter (Signed)
 Please call patient's pharmacy and see if they have any old refills left on her Viibryd  that they could fill for her this is actually prescribed by her psychiatrist and had reached out to their office last week but she had just gotten discharged from the hospital and is having some trouble maneuvering.  Also she is having to pay out of pocket for the Mucinex  that we send as a prescription please check with the pharmacist to see if there might be some options or different versions that they think might be covered by her insurance that we could run through.

## 2023-12-22 NOTE — Assessment & Plan Note (Signed)
 Will try to call the pharmacy and see if she might have some old refills left on the Viibryd  that she might be more to pick up until she hears back from the psychiatrist office.  If she does not have any then I can call in 2 weeks worth until she can get in with them.

## 2023-12-22 NOTE — Progress Notes (Signed)
 Pt would like to sign a DNR the next time that she comes into the office. She reports that her daughter doesn't want her there in the home and she doesn't want to be here anymore. She doesn't have any plans to hurt or harm herself. She stated that she is doing all that she can to do what she needs to do to get out of her daughter's home however, at present she is paying rent and has nowhere to go.

## 2023-12-23 DIAGNOSIS — R7881 Bacteremia: Secondary | ICD-10-CM | POA: Diagnosis not present

## 2023-12-24 DIAGNOSIS — R7881 Bacteremia: Secondary | ICD-10-CM | POA: Diagnosis not present

## 2023-12-24 DIAGNOSIS — R0902 Hypoxemia: Secondary | ICD-10-CM | POA: Diagnosis not present

## 2023-12-25 ENCOUNTER — Telehealth (INDEPENDENT_AMBULATORY_CARE_PROVIDER_SITE_OTHER): Payer: 59 | Admitting: Physician Assistant

## 2023-12-25 ENCOUNTER — Telehealth: Payer: Self-pay

## 2023-12-25 ENCOUNTER — Ambulatory Visit: Payer: Self-pay | Admitting: Family Medicine

## 2023-12-25 DIAGNOSIS — J029 Acute pharyngitis, unspecified: Secondary | ICD-10-CM

## 2023-12-25 DIAGNOSIS — J9611 Chronic respiratory failure with hypoxia: Secondary | ICD-10-CM | POA: Diagnosis not present

## 2023-12-25 MED ORDER — PREDNISONE 20 MG PO TABS: ORAL_TABLET | ORAL | 0 refills | Status: DC

## 2023-12-25 NOTE — Telephone Encounter (Signed)
 Copied from CRM [id number]. Topic: Clinical - Medical Advice >> Dec 25, 2023 12:16 PM Leotis ORN wrote: Reason for CRM: Pt is very frustrated stated that the provider Milon Cleaves, PA she was sent to was horrible, she did not feel heard or listened too, he finally agreed to give her 20 mg steroids for respiratory he does not want to give her an antibiotic since she was on broad spectrum antibiotic pt does not trust that provider and would like to hear from dr alvan that she does not need those, pt stated her cough is not productive even with the expectorant she would like to know if she can double that dosage, and having shortness of breath with exertion.   Pt is feeling very dismissed and distrustful, she does not want to be seen outside again of the clinic again. she is wanting dr claudia guidance only.   Pt would like a callback # [phone]

## 2023-12-25 NOTE — Telephone Encounter (Signed)
 Copied from CRM [id number]. Topic: Clinical - Red Word Triage >> Dec 25, 2023 10:32 AM Franky GRADE wrote: Red Word that prompted transfer to Nurse Triage: Patient is calling with severe sore throat which is causing difficulty with her breathing.  Chief Complaint: Sore throat Symptoms: Sore throat, SOB, cough, and sweats Frequency: Last night Pertinent Negatives: Patient denies fever and vomiting Disposition: [] ED /[] Urgent Care (no appt availability in office) / [x] Appointment(In office/virtual)/ []  [facility]/ [] Home Care/ [] Refused Recommended Disposition /[] [facility] [place] [facility]/ []  Follow-up with PCP Additional Notes: Patient called in complaining of new onset of sore throat. Patient also complained of cough, sweats, and SOB when active. Patient reported that SOB is due to sore throat and was able to speak clearly with me on the phone. Other family members are currently ill. Patient was recently hospitalized. Patient currently has a PICC line and is taking steroids twice a day. Patient has been taking over the counter cold/flu medicine and Zofran  to manage symptoms and denies relief. Advised appointment with provider today, due to current PICC line in place. Patient normally sees Dr. Alvan, but no availability today. Asked for Dr. Alvan to be made aware of conversation. Scheduled video appointment for this morning with a different provider. Advised patient to call back if symptoms worsen following her appointment. Patient complied.    Reason for Disposition  Diabetes mellitus or weak immune system (e.g., HIV positive, cancer chemo, splenectomy, organ transplant, chronic steroids)  Answer Assessment - Initial Assessment Questions 1. ONSET: When did the throat start hurting? (Hours or days ago)      Early this morning  2. SEVERITY: How bad is the sore throat? (Scale 1-10; mild, moderate or severe)   - MILD (1-3):  Doesn't interfere with eating or normal activities.   -  MODERATE (4-7): Interferes with eating some solids and normal activities.   - SEVERE (8-10):  Excruciating pain, interferes with most normal activities.   - SEVERE WITH DYSPHAGIA (10): Can't swallow liquids, drooling.     Mild/moderate- patient stated it does not interfere with eating or drinking  3. STREP EXPOSURE: Has there been any exposure to strep within the past week? If Yes, ask: What type of contact occurred?      Family members are sick, but undiagnosed  4.  VIRAL SYMPTOMS: Are there any symptoms of a cold, such as a runny nose, cough, hoarse voice or red eyes?      Cough, SOB, and sweats  5. FEVER: Do you have a fever? If Yes, ask: What is your temperature, how was it measured, and when did it start?     Denies fever at this time, but has not taken temperature to be sure  6. PUS ON THE TONSILS: Is there pus on the tonsils in the back of your throat?     Patient has not looked   7. OTHER SYMPTOMS: Do you have any other symptoms? (e.g., difficulty breathing, headache, rash)     SOB when moving and talking, but patient states it is more so due to sore throat, denies SOB at rest  Protocols used: Sore Throat-A-AH

## 2023-12-25 NOTE — Telephone Encounter (Signed)
 She states she has a sore throat, trouble getting a deep breath and nasal congestion. Denies fever, chills or sweats.   I did advise she should not double up on the Mucinex.   She is wanting to know if Dr Linford Arnold thinks she needs an antibiotic.

## 2023-12-25 NOTE — Progress Notes (Signed)
 Virtual Visit via Video Note   This visit type was conducted per patient request This format is felt to be appropriate for this patient at this time.  All issues noted in this document were discussed and addressed.  A limited physical exam was performed with this format.  A verbal consent was obtained for the virtual visit.   Date:  12/27/2023   ID:  Krystal Burgess, DOB 01-25-1961, MRN [id number]  Patient Location: Home Provider Location: Office/Clinic  PCP:  Krystal Dorothyann BIRCH, MD   Chief Complaint:  Sore throat, Congestion  Discussed the use of AI scribe software for clinical note transcription with the patient, who gave verbal consent to proceed.  Discussed the use of AI scribe software for clinical note transcription with the patient, who gave verbal consent to proceed.  History of Present Illness   The patient, with a history of MRSA infection managed with Gaptomycin via a PICC line, presents with an overnight onset of sore throat, difficulty coughing up mucus, and increased shortness of breath. The patient reports feeling unwell and has been taking over-the-counter cold and flu medication and mucus relief medication. The patient has a history of similar symptoms, which have been previously managed with antibiotics and steroids. The patient also reports using inhalers and nebulizers for breathing issues. The patient is concerned about the current symptoms leading to a more severe illness and is seeking immediate treatment. I discussed with the patient that having symptoms start so soon it could be viral in nature and that she should continue using OTC treatments to help with symptom management. I also discussed with her that since she has a PICC line in and is getting daily antibiotics it is less likely for her to have a bacterial infection, especially with a lack of fever and productive cough. Patient emphasized that they can't cough anything up and that they want to so they can start  feeling better. Discussed with her that it seems more like a dry cough then that could also be hinting at being more viral in nature and more antibiotics will not help treat it sooner.  Discussed with her that it could lead to larger GI complications that could then lead to being hospitalized again. Patient seems very agitated that I would not simply send her in antibiotics to treat her cough. The video and audio quality of the visit also became very poor. The video would freeze and I could not hear her. As the video came back the audio was severely behind by a few minutes so although I could now see the patient and see she was talking I would not hear what she was saying for 1-2 minutes. Patient began getting more agitated and insisted I call her to send her in an antibiotic along with a steroid.  I reiterated that sending more antibiotics with what she was already taking is not suggested based on her symptoms. Patient then asked if I would send a steroid due to that helping her with her breathing. Stated that she feels she is continuing to have difficulty breathing despite her inhaler and nebulizer. Discussed with her that if she is feeling worsening shortness of breath she should go on to the ER as a video visit does not allow us  to listen to her lungs and hear if there are more concerning reasons for her SOB.  Patient again began to be agitated and asked if I could at least refill the 20mg  prednisone  she was prescribed from the  hospital due to it helping her feel better than the 5mg  one she was taking daily before. Agreed to send in a short course of 20mg  and that if her symptoms continue, she needs to go to the ER, Urgent care, or schedule and in person visit. Patient was visibly upset and seemed to only want more antibiotics to be sent.            History of Present Illness:    Krystal Burgess is a 63 y.o. female with Congestion, sore throat stated it started over night and symptoms are getting  worst. Patient stated she just got home from the hospital Thursday night was admitted for Chronic obstructive pulmonary disease . She lives with her daughter and grandson and they all are starting to get sick.  Chief Complaint  Patient presents with   Sore Throat   Nasal Congestion  .  The patient does not have symptoms concerning for COVID-19 infection (fever, chills, cough, or new shortness of breath).    Past Medical History:  Diagnosis Date   Allergy    Hyperlipidemia    Pain    Thyroid  disease     Past Surgical History:  Procedure Laterality Date   CESAREAN SECTION     CHOLECYSTECTOMY     lipoma removal     RT shoulder   LUMBAR SPINE SURGERY  12/23/2011   Dr. Delores, L4-5   pilonydal cyst  12/22/1992   TOTAL ABDOMINAL HYSTERECTOMY  1999   for abnormal cervical cells and fibroids    Family History  Problem Relation Age of Onset   Diabetes Brother    Depression Brother        bipolar   Cancer Brother        throat   Diabetes Other        grandmother   Depression Mother    Hyperlipidemia Mother    Hypertension Mother    Pancreatic cancer Mother    Hypertension Father     Social History   Socioeconomic History   Marital status: Divorced    Spouse name: Not on file   Number of children: 1   Years of education: 1   Highest education level: 12th grade  Occupational History   Occupation: Disabled  Tobacco Use   Smoking status: Former    Current packs/day: 0.00    Types: Cigarettes    Quit date: 03/19/2020    Years since quitting: 3.7   Smokeless tobacco: Never  Vaping Use   Vaping status: Every Day   Substances: Nicotine   Substance and Sexual Activity   Alcohol use: Not Currently   Drug use: No   Sexual activity: Not Currently  Other Topics Concern   Not on file  Social History Narrative   Lives with her daughter. She enjoys playing games on her computer.   Social Drivers of Corporate Investment Banker Strain: Low Risk  (12/25/2023)   Overall  Financial Resource Strain (CARDIA)    Difficulty of Paying Living Expenses: Not very hard  Food Insecurity: No Food Insecurity (12/25/2023)   Hunger Vital Sign    Worried About Running Out of Food in the Last Year: Never true    Ran Out of Food in the Last Year: Never true  Transportation Needs: No Transportation Needs (12/25/2023)   PRAPARE - Administrator, Civil Service (Medical): No    Lack of Transportation (Non-Medical): No  Physical Activity: Inactive (12/25/2023)   Exercise Vital Sign  Days of Exercise per Week: 0 days    Minutes of Exercise per Session: 0 Krystal  Stress: Stress Concern Present (12/25/2023)   [employer] of Occupational Health - Occupational Stress Questionnaire    Feeling of Stress : Rather much  Social Connections: Socially Isolated (12/25/2023)   Social Connection and Isolation Panel [NHANES]    Frequency of Communication with Friends and Family: Once a week    Frequency of Social Gatherings with Friends and Family: Never    Attends Religious Services: Never    Database Administrator or Organizations: No    Attends Banker Meetings: Never    Marital Status: Divorced  Catering Manager Violence: Not At Risk (12/21/2023)   Humiliation, Afraid, Rape, and Kick questionnaire    Fear of Current or Ex-Partner: No    Emotionally Abused: No    Physically Abused: No    Sexually Abused: No    Outpatient Medications Prior to Visit  Medication Sig Dispense Refill   albuterol  (VENTOLIN  HFA) 108 (90 Base) MCG/ACT inhaler INHALE 2 PUFFS INTO THE LUNGS EVERY 4 HOURS AS NEEDED FOR WHEEZING OR SHORTNESS OF BREATH. 18 each 1   ALPRAZolam  (XANAX ) 1 MG tablet Take 1 mg by mouth 3 (three) times daily as needed for anxiety.     AMBULATORY NON FORMULARY MEDICATION Battery powered portable oxygen  concentrator 1 each 0   AMBULATORY NON FORMULARY MEDICATION Battery powered portable nebulizer 1 each 0   amLODipine  (NORVASC ) 5 MG tablet Take 5 mg by mouth daily.      azelastine  (ASTELIN ) 0.1 % nasal spray Place 2 sprays into both nostrils 2 (two) times daily. Use in each nostril as directed 30 mL 12   Blood Glucose Monitoring Suppl DEVI 1 each by Does not apply route daily as needed. May substitute to any manufacturer covered by patient's insurance. 1 each 0   buprenorphine (BUTRANS) 15 MCG/HR Place 1 patch onto the skin once a week.     fluticasone  (FLONASE ) 50 MCG/ACT nasal spray SPRAY 2 SPRAYS INTO EACH NOSTRIL EVERY DAY 48 mL 1   folic acid  (FOLVITE ) 1 MG tablet TAKE 1 TABLET BY MOUTH EVERY DAY 90 tablet 1   furosemide  (LASIX ) 20 MG tablet Take 1 tablet (20 mg total) by mouth daily as needed. 15 tablet 0   gabapentin  (NEURONTIN ) 600 MG tablet TAKE 2 TABLETS (1,200 MG TOTAL) BY MOUTH AT BEDTIME. PATIENT TAKES 1200 MG DAILY 180 tablet 1   ipratropium (ATROVENT ) 0.06 % nasal spray Place 2 sprays into both nostrils 3 (three) times daily. 45 mL 11   ipratropium-albuterol  (DUONEB) 0.5-2.5 (3) MG/3ML SOLN INHALE 3 MLS INTO THE LUNGS EVERY 2 (TWO) HOURS AS NEEDED (WHEEZING OR SHORTNESS OF BREATH). 540 mL 6   levothyroxine  (SYNTHROID ) 88 MCG tablet TAKE 1 TABLET BY MOUTH EVERY DAY 90 tablet 1   losartan  (COZAAR ) 100 MG tablet TAKE 1 TABLET BY MOUTH EVERY DAY 90 tablet 1   ondansetron  (ZOFRAN ) 8 MG tablet Take 1 tablet (8 mg total) by mouth every 8 (eight) hours as needed for nausea or vomiting. 20 tablet 0   pantoprazole  (PROTONIX ) 40 MG tablet TAKE 1 TABLET BY MOUTH EVERY DAY 90 tablet 3   predniSONE  (DELTASONE ) 5 MG tablet TAKE 1/2 TABLET (2.5 MG TOTAL) BY MOUTH DAILY WITH BREAKFAST. 15 tablet 3   rosuvastatin  (CRESTOR ) 20 MG tablet Take 1 tablet (20 mg total) by mouth at bedtime. 90 tablet 3   theophylline  (THEODUR) 300 MG 12 hr tablet TAKE  1 TABLET BY MOUTH TWICE A DAY 180 tablet 1   tiZANidine  (ZANAFLEX ) 4 MG tablet Take 1 tablet (4 mg total) by mouth 2 (two) times daily as needed for muscle spasms. 45 tablet 1   traZODone  (DESYREL ) 50 MG tablet Take 50 mg  by mouth at bedtime as needed for sleep.     TRELEGY ELLIPTA  100-62.5-25 MCG/ACT AEPB INHALE 1 PUFF BY MOUTH EVERY DAY 60 each 6   Vilazodone  HCl 20 MG TABS Take 20 mg by mouth daily.     No facility-administered medications prior to visit.    Allergies  Allergen Reactions   Risperidone  And Related Shortness Of Breath   Aripiprazole Other (See Comments)    REACTION: muscle jerks    Fluoxetine Hcl Other (See Comments)    REACTION: Intolerant   Nsaids Other (See Comments)    Upper GI bleed CKD 3    Paroxetine Other (See Comments)    REACTION: Intolerance    Ziprasidone  Hcl Other (See Comments)    shakes    Chantix  [Varenicline  Tartrate] Other (See Comments)    messing with my mood   Metformin And Related Nausea Only   Montelukast  Other (See Comments)    it kept me sick with an upper respiratory infection   Onglyza [Saxagliptin ] Nausea Only   Augmentin  [Amoxicillin -Pot Clavulanate] Other (See Comments)    Gets yeast infection every time    Fluoxetine Hcl     REACTION: Intolerant     Social History   Tobacco Use   Smoking status: Former    Current packs/day: 0.00    Types: Cigarettes    Quit date: 03/19/2020    Years since quitting: 3.7   Smokeless tobacco: Never  Vaping Use   Vaping status: Every Day   Substances: Nicotine   Substance Use Topics   Alcohol use: Not Currently   Drug use: No     Review of Systems  Constitutional:  Negative for chills, diaphoresis and fever.  HENT:  Positive for sore throat. Negative for congestion, ear discharge, ear pain, hearing loss and sinus pain.   Eyes:  Negative for pain and discharge.  Respiratory:  Positive for cough and shortness of breath. Negative for hemoptysis and sputum production.   Cardiovascular:  Negative for chest pain and palpitations.  Gastrointestinal:  Negative for abdominal pain, constipation, diarrhea, nausea and vomiting.  Musculoskeletal:  Negative for joint pain and myalgias.  Skin:  Negative for  rash.  Neurological:  Negative for headaches.     Labs/Other Tests and Data Reviewed:    Recent Labs: 01/15/2023: B Natriuretic Peptide 70.0 01/18/2023: TSH 7.334 02/01/2023: ALT 16 02/02/2023: Magnesium  1.9 04/28/2023: BUN 14; Creat 1.54; Hemoglobin 10.4; Platelets 321; Potassium 4.4; Sodium 142   Recent Lipid Panel Lab Results  Component Value Date/Time   CHOL 109 11/28/2021 12:00 AM   TRIG 175 (H) 11/28/2021 12:00 AM   HDL 46 (L) 11/28/2021 12:00 AM   CHOLHDL 2.4 11/28/2021 12:00 AM   LDLCALC 38 11/28/2021 12:00 AM    Wt Readings from Last 3 Encounters:  03/13/23 233 lb (105.7 kg)  02/05/23 233 lb (105.7 kg)  02/02/23 235 lb 14.3 oz (107 kg)     Objective:    Vital Signs:  There were no vitals taken for this visit.   Physical Exam  Unable to perform due to  video visit. Patient appeared very agitated and upset during the visit. This only intensified after discussing with her that her current symptoms do not point to  a possible infection in her throat that could be treated by antibiotics and that if she is unable to cough anything up that is more of a dry cough that could be viral in nature for which antibiotics could not treat. Patient was not happy and seemed annoyed by the diagnosis.   Total time spent on today's visit was greater than 30 minutes, including both video time and nonface-to-face time personally spent on review of chart (labs and imaging), discussing labs and goals, discussing further work-up, treatment options, referrals to specialist if needed, reviewing outside records of pertinent, answering patient's questions, and coordinating care.   ASSESSMENT & PLAN:   Acute viral pharyngitis Assessment & Plan: Sudden onset of sore throat, difficulty coughing up mucus, and increased shortness of breath. Currently on Gaptomycin via PICC line for MRSA. The possibility of a viral etiology was discussed given the patient's current antibiotic regimen. -Continue current  antibiotic regimen. -Use throat spray as needed for symptomatic relief.   Chronic respiratory failure with hypoxia (HCC) Assessment & Plan: Increased shortness of breath and difficulty expectorating mucus. Currently using Trelegy and nebulizer treatments. -Continue current inhaler and nebulizer treatments. -Refill Ipratropium Bromide  and Albuterol  nebulizer solution through pharmacy as there are pending refills in her chart.   Orders: -     predniSONE ; Take 3 tablets (60 mg total) by mouth daily with breakfast for 3 days, THEN 2 tablets (40 mg total) daily with breakfast for 3 days, THEN 1 tablet (20 mg total) daily with breakfast for 3 days.  Dispense: 18 tablet; Refill: 0     No orders of the defined types were placed in this encounter.    Meds ordered this encounter  Medications   predniSONE  (DELTASONE ) 20 MG tablet    Sig: Take 3 tablets (60 mg total) by mouth daily with breakfast for 3 days, THEN 2 tablets (40 mg total) daily with breakfast for 3 days, THEN 1 tablet (20 mg total) daily with breakfast for 3 days.    Dispense:  18 tablet    Refill:  0  Follow-up Encouraged patient to call with any questions or go to the ER, urgent care, or schedule and in person visit ifs symptoms worsen.            Follow Up:  In Person prn  Bonney Nola Angles, [place]  12/27/2023 10:30 PM    [facility]

## 2023-12-26 DIAGNOSIS — R7881 Bacteremia: Secondary | ICD-10-CM | POA: Diagnosis not present

## 2023-12-27 ENCOUNTER — Encounter: Payer: Self-pay | Admitting: Physician Assistant

## 2023-12-27 DIAGNOSIS — J029 Acute pharyngitis, unspecified: Secondary | ICD-10-CM | POA: Insufficient documentation

## 2023-12-27 NOTE — Assessment & Plan Note (Signed)
 Sudden onset of sore throat, difficulty coughing up mucus, and increased shortness of breath. Currently on Gaptomycin via PICC line for MRSA. The possibility of a viral etiology was discussed given the patient's current antibiotic regimen. -Continue current antibiotic regimen. -Use throat spray as needed for symptomatic relief.

## 2023-12-27 NOTE — Assessment & Plan Note (Signed)
 Increased shortness of breath and difficulty expectorating mucus. Currently using Trelegy and nebulizer treatments. -Continue current inhaler and nebulizer treatments. -Refill Ipratropium Bromide  and Albuterol  nebulizer solution through pharmacy as there are pending refills in her chart.

## 2023-12-28 ENCOUNTER — Encounter: Payer: Self-pay | Admitting: Family Medicine

## 2023-12-28 DIAGNOSIS — G894 Chronic pain syndrome: Secondary | ICD-10-CM | POA: Diagnosis not present

## 2023-12-28 NOTE — Telephone Encounter (Signed)
 I did look over the note and agree that she is already on IV antibiotics and we do not need to add anything to it.  Just make sure she is using her Flonase  to help with the nasal swelling and congestion and see if she is feeling any better after the steroid over the weekend.

## 2023-12-29 ENCOUNTER — Encounter (HOSPITAL_COMMUNITY): Payer: Self-pay | Admitting: Emergency Medicine

## 2023-12-29 ENCOUNTER — Emergency Department (HOSPITAL_COMMUNITY): Payer: 59

## 2023-12-29 ENCOUNTER — Inpatient Hospital Stay (HOSPITAL_COMMUNITY)
Admission: EM | Admit: 2023-12-29 | Discharge: 2023-12-31 | DRG: 194 | Disposition: A | Discharge status: Home Health | Payer: 59 | Attending: Internal Medicine | Admitting: Internal Medicine

## 2023-12-29 ENCOUNTER — Other Ambulatory Visit: Payer: Self-pay

## 2023-12-29 DIAGNOSIS — Z8249 Family history of ischemic heart disease and other diseases of the circulatory system: Secondary | ICD-10-CM | POA: Diagnosis not present

## 2023-12-29 DIAGNOSIS — R6889 Other general symptoms and signs: Secondary | ICD-10-CM | POA: Diagnosis not present

## 2023-12-29 DIAGNOSIS — I120 Hypertensive chronic kidney disease with stage 5 chronic kidney disease or end stage renal disease: Secondary | ICD-10-CM | POA: Diagnosis present

## 2023-12-29 DIAGNOSIS — F319 Bipolar disorder, unspecified: Secondary | ICD-10-CM | POA: Diagnosis present

## 2023-12-29 DIAGNOSIS — Z1152 Encounter for screening for COVID-19: Secondary | ICD-10-CM

## 2023-12-29 DIAGNOSIS — R0689 Other abnormalities of breathing: Secondary | ICD-10-CM | POA: Diagnosis not present

## 2023-12-29 DIAGNOSIS — Z83438 Family history of other disorder of lipoprotein metabolism and other lipidemia: Secondary | ICD-10-CM | POA: Diagnosis not present

## 2023-12-29 DIAGNOSIS — Z87891 Personal history of nicotine dependence: Secondary | ICD-10-CM | POA: Diagnosis not present

## 2023-12-29 DIAGNOSIS — Z6841 Body Mass Index (BMI) 40.0 and over, adult: Secondary | ICD-10-CM

## 2023-12-29 DIAGNOSIS — F419 Anxiety disorder, unspecified: Secondary | ICD-10-CM | POA: Diagnosis present

## 2023-12-29 DIAGNOSIS — Z886 Allergy status to analgesic agent status: Secondary | ICD-10-CM | POA: Diagnosis not present

## 2023-12-29 DIAGNOSIS — R739 Hyperglycemia, unspecified: Secondary | ICD-10-CM | POA: Diagnosis not present

## 2023-12-29 DIAGNOSIS — Z833 Family history of diabetes mellitus: Secondary | ICD-10-CM

## 2023-12-29 DIAGNOSIS — Z8614 Personal history of Methicillin resistant Staphylococcus aureus infection: Secondary | ICD-10-CM | POA: Diagnosis not present

## 2023-12-29 DIAGNOSIS — J441 Chronic obstructive pulmonary disease with (acute) exacerbation: Secondary | ICD-10-CM | POA: Diagnosis present

## 2023-12-29 DIAGNOSIS — Z79899 Other long term (current) drug therapy: Secondary | ICD-10-CM

## 2023-12-29 DIAGNOSIS — I499 Cardiac arrhythmia, unspecified: Secondary | ICD-10-CM | POA: Diagnosis not present

## 2023-12-29 DIAGNOSIS — J9611 Chronic respiratory failure with hypoxia: Secondary | ICD-10-CM | POA: Diagnosis not present

## 2023-12-29 DIAGNOSIS — G4733 Obstructive sleep apnea (adult) (pediatric): Secondary | ICD-10-CM | POA: Diagnosis present

## 2023-12-29 DIAGNOSIS — Z743 Need for continuous supervision: Secondary | ICD-10-CM | POA: Diagnosis not present

## 2023-12-29 DIAGNOSIS — J101 Influenza due to other identified influenza virus with other respiratory manifestations: Principal | ICD-10-CM | POA: Diagnosis present

## 2023-12-29 DIAGNOSIS — J4489 Other specified chronic obstructive pulmonary disease: Secondary | ICD-10-CM | POA: Diagnosis present

## 2023-12-29 DIAGNOSIS — J45901 Unspecified asthma with (acute) exacerbation: Secondary | ICD-10-CM | POA: Diagnosis not present

## 2023-12-29 DIAGNOSIS — K219 Gastro-esophageal reflux disease without esophagitis: Secondary | ICD-10-CM | POA: Diagnosis present

## 2023-12-29 DIAGNOSIS — E039 Hypothyroidism, unspecified: Secondary | ICD-10-CM | POA: Diagnosis not present

## 2023-12-29 DIAGNOSIS — Z888 Allergy status to other drugs, medicaments and biological substances status: Secondary | ICD-10-CM

## 2023-12-29 DIAGNOSIS — J111 Influenza due to unidentified influenza virus with other respiratory manifestations: Secondary | ICD-10-CM

## 2023-12-29 DIAGNOSIS — N185 Chronic kidney disease, stage 5: Secondary | ICD-10-CM | POA: Diagnosis present

## 2023-12-29 DIAGNOSIS — E782 Mixed hyperlipidemia: Secondary | ICD-10-CM | POA: Diagnosis present

## 2023-12-29 DIAGNOSIS — R918 Other nonspecific abnormal finding of lung field: Secondary | ICD-10-CM | POA: Diagnosis not present

## 2023-12-29 DIAGNOSIS — E66813 Obesity, class 3: Secondary | ICD-10-CM | POA: Diagnosis present

## 2023-12-29 DIAGNOSIS — J9601 Acute respiratory failure with hypoxia: Secondary | ICD-10-CM | POA: Diagnosis not present

## 2023-12-29 DIAGNOSIS — J9612 Chronic respiratory failure with hypercapnia: Secondary | ICD-10-CM | POA: Diagnosis not present

## 2023-12-29 DIAGNOSIS — Z7951 Long term (current) use of inhaled steroids: Secondary | ICD-10-CM

## 2023-12-29 DIAGNOSIS — F3132 Bipolar disorder, current episode depressed, moderate: Secondary | ICD-10-CM | POA: Diagnosis not present

## 2023-12-29 DIAGNOSIS — J9602 Acute respiratory failure with hypercapnia: Secondary | ICD-10-CM | POA: Diagnosis not present

## 2023-12-29 DIAGNOSIS — I10 Essential (primary) hypertension: Secondary | ICD-10-CM | POA: Diagnosis not present

## 2023-12-29 DIAGNOSIS — R0602 Shortness of breath: Secondary | ICD-10-CM | POA: Diagnosis not present

## 2023-12-29 DIAGNOSIS — Z79890 Hormone replacement therapy (postmenopausal): Secondary | ICD-10-CM

## 2023-12-29 DIAGNOSIS — Z452 Encounter for adjustment and management of vascular access device: Secondary | ICD-10-CM | POA: Diagnosis not present

## 2023-12-29 HISTORY — DX: Unspecified glaucoma: H40.9

## 2023-12-29 HISTORY — DX: Disorder of kidney and ureter, unspecified: N28.9

## 2023-12-29 HISTORY — DX: Carrier or suspected carrier of methicillin resistant Staphylococcus aureus: Z22.322

## 2023-12-29 HISTORY — DX: Chronic obstructive pulmonary disease, unspecified: J44.9

## 2023-12-29 HISTORY — DX: Essential (primary) hypertension: I10

## 2023-12-29 LAB — COMPREHENSIVE METABOLIC PANEL
ALT: 134 U/L — ABNORMAL HIGH (ref 0–44)
AST: 244 U/L — ABNORMAL HIGH (ref 15–41)
Albumin: 3.5 g/dL (ref 3.5–5.0)
Alkaline Phosphatase: 79 U/L (ref 38–126)
Anion gap: 10 (ref 5–15)
BUN: 15 mg/dL (ref 6–20)
CO2: 38 mmol/L — ABNORMAL HIGH (ref 22–32)
Calcium: 9.5 mg/dL (ref 8.9–10.3)
Chloride: 90 mmol/L — ABNORMAL LOW (ref 98–111)
Creatinine, Ser: 1.21 mg/dL — ABNORMAL HIGH (ref 0.44–1.00)
GFR, Estimated: 51 mL/min — ABNORMAL LOW (ref >60)
Glucose, Bld: 183 mg/dL — ABNORMAL HIGH (ref 70–99)
Potassium: 4 mmol/L (ref 3.5–5.1)
Sodium: 138 mmol/L (ref 135–145)
Total Bilirubin: 0.2 mg/dL (ref 0.0–1.2)
Total Protein: 7.9 g/dL (ref 6.5–8.1)

## 2023-12-29 LAB — BLOOD GAS, ARTERIAL
Acid-Base Excess: 16.3 mmol/L — ABNORMAL HIGH (ref 0.0–2.0)
Bicarbonate: 45.7 mmol/L — ABNORMAL HIGH (ref 20.0–28.0)
Drawn by: 41977
O2 Saturation: 95.8 %
Patient temperature: 37
pCO2 arterial: 79 mm[Hg] (ref 32–48)
pH, Arterial: 7.37 (ref 7.35–7.45)
pO2, Arterial: 78 mm[Hg] — ABNORMAL LOW (ref 83–108)

## 2023-12-29 LAB — CBC
HCT: 39 % (ref 36.0–46.0)
Hemoglobin: 11.3 g/dL — ABNORMAL LOW (ref 12.0–15.0)
MCH: 28.3 pg (ref 26.0–34.0)
MCHC: 29 g/dL — ABNORMAL LOW (ref 30.0–36.0)
MCV: 97.5 fL (ref 80.0–100.0)
Platelets: 281 10*3/uL (ref 150–400)
RBC: 4 MIL/uL (ref 3.87–5.11)
RDW: 17.1 % — ABNORMAL HIGH (ref 11.5–15.5)
WBC: 10.6 10*3/uL — ABNORMAL HIGH (ref 4.0–10.5)
nRBC: 0 % (ref 0.0–0.2)

## 2023-12-29 LAB — RESP PANEL BY RT-PCR (RSV, FLU A&B, COVID)  RVPGX2
Influenza A by PCR: POSITIVE — AB
Influenza B by PCR: NEGATIVE
Resp Syncytial Virus by PCR: NEGATIVE
SARS Coronavirus 2 by RT PCR: NEGATIVE

## 2023-12-29 LAB — TROPONIN I (HIGH SENSITIVITY): Troponin I (High Sensitivity): 35 ng/L — ABNORMAL HIGH (ref <18)

## 2023-12-29 MED ORDER — METHYLPREDNISOLONE SODIUM SUCC 125 MG IJ SOLR
125.0000 mg | Freq: Once | INTRAMUSCULAR | Status: AC
  Administered 2023-12-29: 125 mg via INTRAVENOUS
  Filled 2023-12-29: qty 2

## 2023-12-29 MED ORDER — ACETAMINOPHEN 650 MG RE SUPP: 650.0000 mg | Freq: Four times a day (QID) | RECTAL | Status: DC | PRN

## 2023-12-29 MED ORDER — LEVALBUTEROL HCL 0.63 MG/3ML IN NEBU
0.6300 mg | INHALATION_SOLUTION | RESPIRATORY_TRACT | Status: DC
  Administered 2023-12-29 – 2023-12-30 (×2): 0.63 mg via RESPIRATORY_TRACT
  Filled 2023-12-29 (×3): qty 3

## 2023-12-29 MED ORDER — OXYCODONE-ACETAMINOPHEN 5-325 MG PO TABS
1.0000 | ORAL_TABLET | Freq: Three times a day (TID) | ORAL | Status: DC | PRN
  Administered 2023-12-29 – 2023-12-30 (×2): 1 via ORAL
  Administered 2023-12-30 – 2023-12-31 (×2): 2 via ORAL
  Filled 2023-12-29 (×2): qty 2
  Filled 2023-12-29: qty 1
  Filled 2023-12-29: qty 2

## 2023-12-29 MED ORDER — LEVALBUTEROL HCL 0.63 MG/3ML IN NEBU
0.6300 mg | INHALATION_SOLUTION | Freq: Four times a day (QID) | RESPIRATORY_TRACT | Status: DC | PRN
  Administered 2023-12-29 (×2): 0.63 mg via RESPIRATORY_TRACT
  Filled 2023-12-29 (×2): qty 3

## 2023-12-29 MED ORDER — TRAZODONE HCL 50 MG PO TABS
50.0000 mg | ORAL_TABLET | Freq: Every evening | ORAL | Status: DC | PRN
  Administered 2023-12-29: 50 mg via ORAL
  Filled 2023-12-29: qty 1

## 2023-12-29 MED ORDER — OSELTAMIVIR PHOSPHATE 75 MG PO CAPS
75.0000 mg | ORAL_CAPSULE | Freq: Once | ORAL | Status: AC
  Administered 2023-12-29: 75 mg via ORAL
  Filled 2023-12-29: qty 1

## 2023-12-29 MED ORDER — GABAPENTIN 300 MG PO CAPS
1200.0000 mg | ORAL_CAPSULE | Freq: Every day | ORAL | Status: DC
  Administered 2023-12-29 – 2023-12-30 (×2): 1200 mg via ORAL
  Filled 2023-12-29 (×2): qty 4

## 2023-12-29 MED ORDER — LABETALOL HCL 5 MG/ML IV SOLN
10.0000 mg | INTRAVENOUS | Status: DC | PRN
  Administered 2023-12-29 – 2023-12-30 (×2): 10 mg via INTRAVENOUS
  Filled 2023-12-29 (×2): qty 4

## 2023-12-29 MED ORDER — VILAZODONE HCL 20 MG PO TABS
20.0000 mg | ORAL_TABLET | Freq: Once | ORAL | Status: DC
  Filled 2023-12-29: qty 1

## 2023-12-29 MED ORDER — THEOPHYLLINE ER 300 MG PO TB12
300.0000 mg | ORAL_TABLET | Freq: Two times a day (BID) | ORAL | Status: DC
  Filled 2023-12-29 (×2): qty 1

## 2023-12-29 MED ORDER — ONDANSETRON HCL 4 MG PO TABS: 4.0000 mg | ORAL_TABLET | Freq: Four times a day (QID) | ORAL | Status: DC | PRN

## 2023-12-29 MED ORDER — THEOPHYLLINE ER 300 MG PO TB12
300.0000 mg | ORAL_TABLET | Freq: Two times a day (BID) | ORAL | Status: DC
  Administered 2023-12-30 (×2): 300 mg via ORAL
  Filled 2023-12-29 (×5): qty 1

## 2023-12-29 MED ORDER — HEPARIN SODIUM (PORCINE) 5000 UNIT/ML IJ SOLN
5000.0000 [IU] | Freq: Three times a day (TID) | INTRAMUSCULAR | Status: DC
Start: 2023-12-29 — End: 2023-12-31
  Administered 2023-12-29 – 2023-12-31 (×7): 5000 [IU] via SUBCUTANEOUS
  Filled 2023-12-29 (×7): qty 1

## 2023-12-29 MED ORDER — ALPRAZOLAM 1 MG PO TABS
1.0000 mg | ORAL_TABLET | Freq: Three times a day (TID) | ORAL | Status: DC | PRN
  Administered 2023-12-29 – 2023-12-31 (×5): 1 mg via ORAL
  Filled 2023-12-29 (×2): qty 1
  Filled 2023-12-29 (×3): qty 2

## 2023-12-29 MED ORDER — LEVOTHYROXINE SODIUM 88 MCG PO TABS
88.0000 ug | ORAL_TABLET | Freq: Every day | ORAL | Status: DC
  Administered 2023-12-29 – 2023-12-31 (×3): 88 ug via ORAL
  Filled 2023-12-29 (×3): qty 1

## 2023-12-29 MED ORDER — LOSARTAN POTASSIUM 50 MG PO TABS
100.0000 mg | ORAL_TABLET | Freq: Every day | ORAL | Status: DC
  Administered 2023-12-29 – 2023-12-31 (×3): 100 mg via ORAL
  Filled 2023-12-29: qty 4
  Filled 2023-12-29: qty 2
  Filled 2023-12-29: qty 4

## 2023-12-29 MED ORDER — LEVALBUTEROL HCL 0.63 MG/3ML IN NEBU
0.6300 mg | INHALATION_SOLUTION | RESPIRATORY_TRACT | Status: DC | PRN
Start: 2023-12-29 — End: 2023-12-29

## 2023-12-29 MED ORDER — VILAZODONE HCL 20 MG PO TABS
20.0000 mg | ORAL_TABLET | Freq: Every day | ORAL | Status: DC
  Administered 2023-12-30 – 2023-12-31 (×2): 20 mg via ORAL
  Filled 2023-12-29 (×3): qty 1

## 2023-12-29 MED ORDER — OSELTAMIVIR PHOSPHATE 75 MG PO CAPS: 75.0000 mg | ORAL_CAPSULE | Freq: Two times a day (BID) | ORAL | Status: DC

## 2023-12-29 MED ORDER — AMLODIPINE BESYLATE 5 MG PO TABS
5.0000 mg | ORAL_TABLET | Freq: Every day | ORAL | Status: DC
  Administered 2023-12-29 – 2023-12-30 (×2): 5 mg via ORAL
  Filled 2023-12-29 (×2): qty 1

## 2023-12-29 MED ORDER — OSELTAMIVIR PHOSPHATE 75 MG PO CAPS
75.0000 mg | ORAL_CAPSULE | Freq: Two times a day (BID) | ORAL | Status: DC
  Administered 2023-12-29 – 2023-12-31 (×4): 75 mg via ORAL
  Filled 2023-12-29 (×4): qty 1

## 2023-12-29 MED ORDER — ALBUTEROL SULFATE (2.5 MG/3ML) 0.083% IN NEBU
10.0000 mg/h | INHALATION_SOLUTION | Freq: Once | RESPIRATORY_TRACT | Status: AC
  Administered 2023-12-29: 10 mg/h via RESPIRATORY_TRACT
  Filled 2023-12-29: qty 12

## 2023-12-29 MED ORDER — FOLIC ACID 1 MG PO TABS
1.0000 mg | ORAL_TABLET | Freq: Every day | ORAL | Status: DC
  Administered 2023-12-29 – 2023-12-31 (×3): 1 mg via ORAL
  Filled 2023-12-29 (×3): qty 1

## 2023-12-29 MED ORDER — MELATONIN 3 MG PO TABS
6.0000 mg | ORAL_TABLET | Freq: Every day | ORAL | Status: DC
  Administered 2023-12-29 – 2023-12-30 (×2): 6 mg via ORAL
  Filled 2023-12-29 (×2): qty 2

## 2023-12-29 MED ORDER — TIZANIDINE HCL 4 MG PO TABS
4.0000 mg | ORAL_TABLET | Freq: Every day | ORAL | Status: DC
  Administered 2023-12-29 – 2023-12-30 (×2): 4 mg via ORAL
  Filled 2023-12-29 (×2): qty 1

## 2023-12-29 MED ORDER — ONDANSETRON HCL 4 MG/2ML IJ SOLN
4.0000 mg | Freq: Four times a day (QID) | INTRAMUSCULAR | Status: DC | PRN
  Administered 2023-12-30: 4 mg via INTRAVENOUS
  Filled 2023-12-29: qty 2

## 2023-12-29 MED ORDER — ACETAMINOPHEN 325 MG PO TABS: 650.0000 mg | ORAL_TABLET | Freq: Four times a day (QID) | ORAL | Status: DC | PRN

## 2023-12-29 MED ORDER — PANTOPRAZOLE SODIUM 40 MG PO TBEC
40.0000 mg | DELAYED_RELEASE_TABLET | Freq: Every day | ORAL | Status: DC
  Administered 2023-12-29 – 2023-12-30 (×2): 40 mg via ORAL
  Filled 2023-12-29 (×2): qty 1

## 2023-12-29 NOTE — Care Management CC44 (Signed)
 Condition Code 44 Documentation Completed  Patient Details  Name: Krystal Burgess MRN: [id number] Date of Birth: Jan 30, 1961   Condition Code 44 given:  Yes Patient signature on Condition Code 44 notice:  Yes Documentation of 2 MD's agreement:  Yes Code 44 added to claim:  Yes    [name], LCSWA 12/29/2023, 4:29 PM

## 2023-12-29 NOTE — ED Triage Notes (Signed)
 Pt called ems due to shob. Has been taking breathing tx at home and received one in route. Pt able to complete sentences, pt anxious and restless. Edp in with pt upon arrival. Color wnl. A/o.

## 2023-12-29 NOTE — H&P (Signed)
 History and Physical    Krystal Burgess:[id number] DOB: [birth year] DOA: 12/29/2023  PCP: Alvan Dorothyann BIRCH, MD   Patient coming from: Home  Chief Complaint: Dyspnea  HPI: Krystal Burgess is a 63 y.o. female with medical history significant for asthma/COPD on home 2-3 L nasal cannula oxygen , hypothyroidism, GERD, hypertension, dyslipidemia, morbid obesity who presented to the ED with worsening shortness of breath over the last 2-3 days.  She was recently discharged from [facility] a little over a week ago with acute on chronic hypoxemic respiratory failure secondary to COPD exacerbation and community-acquired pneumonia.  She supposedly had MRSA bacteremia and was started on daptomycin but it is uncertain how compliant she has been with this medication and states that she only received this medication whenever her daughter would give it to her.  She apparently has quite a bit of problems with her daughter at home and states that there are cats at home which aggravate her respiratory symptoms.  She denies any recent fevers or chills.   ED Course: Vital signs with some tachycardia and elevated blood pressure readings.  Patient positive for influenza A.  Chest x-ray with some bibasilar infiltrates.  She was started on Tamiflu  and given a dose of steroids and breathing treatments.  She remains on her baseline nasal cannula oxygen .  ABG demonstrating elevated pCO2 levels, however with normalized pH levels.  AST and ALT levels elevated.  Review of Systems: Reviewed as noted above, otherwise negative.  Past Medical History:  Diagnosis Date   Allergy    COPD (chronic obstructive pulmonary disease) (HCC)    Glaucoma    Hyperlipidemia    Hypertension    MRSA (methicillin resistant staph aureus) culture positive    Pain    Renal disorder    Thyroid  disease     Past Surgical History:  Procedure Laterality Date   CESAREAN SECTION     CHOLECYSTECTOMY     lipoma removal     RT shoulder   LUMBAR  SPINE SURGERY  12/23/2011   Dr. Delores, L4-5   pilonydal cyst  12/22/1992   TOTAL ABDOMINAL HYSTERECTOMY  1999   for abnormal cervical cells and fibroids     reports that she quit smoking about 3 years ago. Her smoking use included cigarettes. She has never used smokeless tobacco. She reports that she does not currently use alcohol. She reports that she does not use drugs.  Allergies  Allergen Reactions   Risperidone  And Related Shortness Of Breath   Aripiprazole Other (See Comments)    REACTION: muscle jerks    Fluoxetine Hcl Other (See Comments)    REACTION: Intolerant   Nsaids Other (See Comments)    Upper GI bleed CKD 3    Paroxetine Other (See Comments)    REACTION: Intolerance    Ziprasidone  Hcl Other (See Comments)    shakes    Chantix  [Varenicline  Tartrate] Other (See Comments)    messing with my mood   Metformin And Related Nausea Only   Montelukast  Other (See Comments)    it kept me sick with an upper respiratory infection   Onglyza [Saxagliptin ] Nausea Only   Augmentin  [Amoxicillin -Pot Clavulanate] Other (See Comments)    Gets yeast infection every time    Fluoxetine Hcl     REACTION: Intolerant    Family History  Problem Relation Age of Onset   Diabetes Brother    Depression Brother        bipolar   Cancer Brother  throat   Diabetes Other        grandmother   Depression Mother    Hyperlipidemia Mother    Hypertension Mother    Pancreatic cancer Mother    Hypertension Father     Prior to Admission medications   Medication Sig Start Date End Date Taking? Authorizing Provider  albuterol  (VENTOLIN  HFA) 108 (90 Base) MCG/ACT inhaler INHALE 2 PUFFS INTO THE LUNGS EVERY 4 HOURS AS NEEDED FOR WHEEZING OR SHORTNESS OF BREATH. 04/03/21  Yes Alvan Dorothyann BIRCH, MD  ALPRAZolam  (XANAX ) 1 MG tablet Take 1 mg by mouth 3 (three) times daily as needed for anxiety.   Yes [provider]  azelastine  (ASTELIN ) 0.1 % nasal spray Place 2 sprays  into both nostrils 2 (two) times daily. Use in each nostril as directed 11/12/23  Yes Alvan Dorothyann BIRCH, MD  buprenorphine (BUTRANS) 15 MCG/HR Place 1 patch onto the skin once a week. 11/26/23 01/21/24 Yes [provider]  DAPTOmycin (CUBICIN) 500 MG injection Inject into the vein daily. 12/24/23  Yes [provider]  fluticasone  (FLONASE ) 50 MCG/ACT nasal spray SPRAY 2 SPRAYS INTO EACH NOSTRIL EVERY DAY 11/24/23  Yes Breeback, Jade L, PA-C  folic acid  (FOLVITE ) 1 MG tablet TAKE 1 TABLET BY MOUTH EVERY DAY 10/27/23  Yes Alvan Dorothyann BIRCH, MD  furosemide  (LASIX ) 20 MG tablet Take 1 tablet (20 mg total) by mouth daily as needed. 11/12/23  Yes Alvan Dorothyann BIRCH, MD  gabapentin  (NEURONTIN ) 600 MG tablet TAKE 2 TABLETS (1,200 MG TOTAL) BY MOUTH AT BEDTIME. PATIENT TAKES 1200 MG DAILY 08/03/23  Yes Metheney, Catherine D, MD  ipratropium (ATROVENT ) 0.06 % nasal spray Place 2 sprays into both nostrils 3 (three) times daily. 06/16/23  Yes Alvan Dorothyann BIRCH, MD  ipratropium-albuterol  (DUONEB) 0.5-2.5 (3) MG/3ML SOLN INHALE 3 MLS INTO THE LUNGS EVERY 2 (TWO) HOURS AS NEEDED (WHEEZING OR SHORTNESS OF BREATH). 10/28/23  Yes Alvan Dorothyann BIRCH, MD  levothyroxine  (SYNTHROID ) 88 MCG tablet TAKE 1 TABLET BY MOUTH EVERY DAY 08/10/23  Yes Alvan Dorothyann BIRCH, MD  losartan  (COZAAR ) 100 MG tablet TAKE 1 TABLET BY MOUTH EVERY DAY 12/25/23  Yes Alvan Dorothyann BIRCH, MD  ondansetron  (ZOFRAN ) 8 MG tablet Take 1 tablet (8 mg total) by mouth every 8 (eight) hours as needed for nausea or vomiting. 02/02/23  Yes Tat, Alm, MD  pantoprazole  (PROTONIX ) 40 MG tablet TAKE 1 TABLET BY MOUTH EVERY DAY 10/19/23  Yes Alvan Dorothyann BIRCH, MD  predniSONE  (DELTASONE ) 20 MG tablet Take 3 tablets (60 mg total) by mouth daily with breakfast for 3 days, THEN 2 tablets (40 mg total) daily with breakfast for 3 days, THEN 1 tablet (20 mg total) daily with breakfast for 3 days. 12/25/23 01/03/24 Yes Craft, Nola, PA   rosuvastatin  (CRESTOR ) 20 MG tablet Take 1 tablet (20 mg total) by mouth at bedtime. 05/14/23  Yes Alvan Dorothyann BIRCH, MD  theophylline  (THEODUR) 300 MG 12 hr tablet TAKE 1 TABLET BY MOUTH TWICE A DAY 10/19/23  Yes Alvan Dorothyann BIRCH, MD  tiZANidine  (ZANAFLEX ) 4 MG tablet Take 1 tablet (4 mg total) by mouth 2 (two) times daily as needed for muscle spasms. 12/22/23  Yes Alvan Dorothyann BIRCH, MD  traZODone  (DESYREL ) 50 MG tablet Take 50 mg by mouth at bedtime as needed for sleep. 03/23/23  Yes Euna Read, MD  TRELEGY ELLIPTA  100-62.5-25 MCG/ACT AEPB INHALE 1 PUFF BY MOUTH EVERY DAY 10/27/23  Yes Alvan Dorothyann BIRCH, MD  Vilazodone  HCl 20 MG TABS Take  20 mg by mouth daily.   Yes [provider]  AMBULATORY NON FORMULARY MEDICATION Battery powered portable oxygen  concentrator 10/28/23   Alvan Dorothyann BIRCH, MD  AMBULATORY NON FORMULARY MEDICATION Battery powered portable nebulizer 10/28/23   Alvan Dorothyann BIRCH, MD  amLODipine  (NORVASC ) 5 MG tablet Take 5 mg by mouth daily. Patient not taking: Reported on 12/29/2023    [provider]  Blood Glucose Monitoring Suppl DEVI 1 each by Does not apply route daily as needed. May substitute to any manufacturer covered by patient's insurance. 05/14/23   Alvan Dorothyann BIRCH, MD  predniSONE  (DELTASONE ) 5 MG tablet TAKE 1/2 TABLET (2.5 MG TOTAL) BY MOUTH DAILY WITH BREAKFAST. Patient not taking: Reported on 12/29/2023 07/28/23   Alvan Dorothyann BIRCH, MD    Physical Exam: Vitals:   12/29/23 0930 12/29/23 0945 12/29/23 1049 12/29/23 1115  BP: (!) 173/99 (!) 176/109  (!) 198/99  Pulse: (!) 116 (!) 122  (!) 121  Resp: (!) 22 (!) 27  (!) 26  Temp:      TempSrc:      SpO2: 100% 99% 95% 93%    Constitutional: NAD, calm, comfortable, morbidly obese Vitals:   12/29/23 0930 12/29/23 0945 12/29/23 1049 12/29/23 1115  BP: (!) 173/99 (!) 176/109  (!) 198/99  Pulse: (!) 116 (!) 122  (!) 121  Resp: (!) 22 (!) 27  (!) 26  Temp:       TempSrc:      SpO2: 100% 99% 95% 93%   Eyes: lids and conjunctivae normal Neck: normal, supple Respiratory: clear to auscultation bilaterally. Normal respiratory effort. No accessory muscle use.  On 3-4 L nasal cannula oxygen  Cardiovascular: Regular rate and rhythm, no murmurs. Abdomen: no tenderness, no distention. Bowel sounds positive.  Musculoskeletal:  No edema. Skin: no rashes, lesions, ulcers.  Left upper extremity PICC line placed. Psychiatric: Flat affect  Labs on Admission: I have personally reviewed following labs and imaging studies  CBC: Recent Labs  Lab 12/29/23 0920  WBC 10.6*  HGB 11.3*  HCT 39.0  MCV 97.5  PLT 281   Basic Metabolic Panel: Recent Labs  Lab 12/29/23 0920  NA 138  K 4.0  CL 90*  CO2 38*  GLUCOSE 183*  BUN 15  CREATININE 1.21*  CALCIUM  9.5   GFR: CrCl cannot be calculated (Unknown ideal weight.). Liver Function Tests: Recent Labs  Lab 12/29/23 0920  AST 244*  ALT 134*  ALKPHOS 79  BILITOT 0.2  PROT 7.9  ALBUMIN 3.5   No results for input(s): LIPASE, AMYLASE in the last 168 hours. No results for input(s): AMMONIA in the last 168 hours. Coagulation Profile: No results for input(s): INR, PROTIME in the last 168 hours. Cardiac Enzymes: No results for input(s): CKTOTAL, CKMB, CKMBINDEX, TROPONINI in the last 168 hours. BNP (last 3 results) No results for input(s): PROBNP in the last 8760 hours. HbA1C: No results for input(s): HGBA1C in the last 72 hours. CBG: No results for input(s): GLUCAP in the last 168 hours. Lipid Profile: No results for input(s): CHOL, HDL, LDLCALC, TRIG, CHOLHDL, LDLDIRECT in the last 72 hours. Thyroid  Function Tests: No results for input(s): TSH, T4TOTAL, FREET4, T3FREE, THYROIDAB in the last 72 hours. Anemia Panel: No results for input(s): VITAMINB12, FOLATE, FERRITIN, TIBC, IRON, RETICCTPCT in the last 72 hours. Urine analysis:     Component Value Date/Time   COLORURINE YELLOW 01/29/2023 2201   APPEARANCEUR CLEAR 01/29/2023 2201   LABSPEC 1.008 01/29/2023 2201   PHURINE 6.0 01/29/2023 2201  GLUCOSEU NEGATIVE 01/29/2023 2201   HGBUR SMALL (A) 01/29/2023 2201   HGBUR negative 05/03/2009 1012   BILIRUBINUR NEGATIVE 01/29/2023 2201   BILIRUBINUR neg 01/15/2015 1433   KETONESUR NEGATIVE 01/29/2023 2201   PROTEINUR NEGATIVE 01/29/2023 2201   UROBILINOGEN 0.2 01/15/2015 1433   UROBILINOGEN 0.2 05/03/2009 1012   NITRITE NEGATIVE 01/29/2023 2201   LEUKOCYTESUR NEGATIVE 01/29/2023 2201    Radiological Exams on Admission: DG Chest Port 1 View Result Date: 12/29/2023 CLINICAL DATA:  Shortness of breath.  PICC line placement. EXAM: PORTABLE CHEST 1 VIEW COMPARISON:  Chest radiograph dated 12/10/2023. FINDINGS: Left-sided PICC with tip over the central SVC close to the cavoatrial junction. Bibasilar opacities again noted. No pleural effusion or pneumothorax. The cardiac silhouette is within normal limits. No acute osseous pathology. IMPRESSION: 1. Left-sided PICC with tip at the cavoatrial junction. 2. Bibasilar infiltrates. Electronically Signed   By: Vanetta Chou M.D.   On: 12/29/2023 09:14    EKG: Independently reviewed. ST 114bpm.  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 if remains stable from a respiratory standpoint and continue on Tamiflu   Mild transaminitis -Without abdominal symptoms -Likely in the setting of influenza -Continue to trend  Recent admission for COPD exacerbation with CAP and MRSA bacteremia -Uncertain compliance on daptomycin, but will recheck blood cultures here and if positive can consult ID and restart -Appears to be improved from COPD standpoint, will order breathing treatments as needed  Uncontrolled hypertension with sinus tachycardia -Labetalol  as needed for significant elevations -Continue home medications  Dyslipidemia -Hold statin given transaminitis  Hypothyroidism -Continue levothyroxine  with recent TSH 0.897  GERD -PPI  Anxiety -Continue Xanax  as needed  Morbid obesity   DVT prophylaxis: Heparin  Code Status: Full Family Communication: None at bedside Disposition Plan:Admit for Influenza/dyspnea Consults called:None Admission status: Obs, Tele  Severity of Illness: The appropriate patient status for this patient is INPATIENT. Inpatient status is judged to be reasonable and necessary in order to provide the required intensity of service to ensure the patient's safety. The patient's presenting symptoms, physical exam findings, and initial radiographic and laboratory data in the context of their chronic comorbidities is felt to place them at high risk for further clinical deterioration. Furthermore, it is not anticipated that the patient will be medically stable for discharge from the hospital within 2 midnights of admission.   * I certify that at the point of admission it is my clinical judgment that the patient will require inpatient hospital care spanning beyond 2 midnights from the point of admission due to high intensity of service, high risk for further deterioration and high frequency of  surveillance required.*   Anamika Kueker D Isel Skufca DO [facility]  If 7PM-7AM, please contact night-coverage [email or web address]  12/29/2023, 11:42 AM

## 2023-12-29 NOTE — ED Provider Notes (Addendum)
 [place] EMERGENCY DEPARTMENT AT [facility] Provider Note   CSN: [id number] Arrival date & time: 12/29/23  9173     History  Chief Complaint  Patient presents with   Shortness of Breath    Krystal Burgess is a 63 y.o. female.   Shortness of Breath  This patient is a 63 year old female who has severe COPD, recent admission for respiratory failure to an outside hospital system where she was diagnosed with some type of MRSA infection in May, had a PICC line placed in her left upper extremity and has been on intravenous vancomycin  which was supposed to be discontinued within the last 2 days.  The patient lives with her daughter and states that she was not getting this medication regularly, there is clearly an abnormal family dynamic that is preventing accurate medication administration.  The patient had a video visit with her doctor about a week ago, they had reviewed some of her lab work as she had had a renal insufficiency during the admission which seem to have improved.  She reports over the last couple of days despite her home use of 3 to 4 L of oxygen  she has had persistent and worsening shortness of breath with a burning in her chest as well.  She denies fevers, chills, she has been coughing but there is no sputum coming up, she denies swelling of the legs.  Paramedics found the patient to be tachycardic but with a normal oxygen  level of about 98% on her home 4 L.  There was no fevers, no hypotension, the patient was not transitioned onto any oral medication for her infection, she is not aware of what the infection was.  She is currently on prednisone     Home Medications Prior to Admission medications   Medication Sig Start Date End Date Taking? Authorizing Provider  albuterol  (VENTOLIN  HFA) 108 (90 Base) MCG/ACT inhaler INHALE 2 PUFFS INTO THE LUNGS EVERY 4 HOURS AS NEEDED FOR WHEEZING OR SHORTNESS OF BREATH. 04/03/21  Yes Alvan Dorothyann BIRCH, MD  ALPRAZolam  (XANAX ) 1 MG  tablet Take 1 mg by mouth 3 (three) times daily as needed for anxiety.   Yes [provider]  azelastine  (ASTELIN ) 0.1 % nasal spray Place 2 sprays into both nostrils 2 (two) times daily. Use in each nostril as directed 11/12/23  Yes Alvan Dorothyann BIRCH, MD  buprenorphine (BUTRANS) 15 MCG/HR Place 1 patch onto the skin once a week. 11/26/23 01/21/24 Yes [provider]  DAPTOmycin (CUBICIN) 500 MG injection Inject into the vein daily. 12/24/23  Yes [provider]  fluticasone  (FLONASE ) 50 MCG/ACT nasal spray SPRAY 2 SPRAYS INTO EACH NOSTRIL EVERY DAY 11/24/23  Yes Breeback, Jade L, PA-C  folic acid  (FOLVITE ) 1 MG tablet TAKE 1 TABLET BY MOUTH EVERY DAY 10/27/23  Yes Alvan Dorothyann BIRCH, MD  furosemide  (LASIX ) 20 MG tablet Take 1 tablet (20 mg total) by mouth daily as needed. 11/12/23  Yes Alvan Dorothyann BIRCH, MD  gabapentin  (NEURONTIN ) 600 MG tablet TAKE 2 TABLETS (1,200 MG TOTAL) BY MOUTH AT BEDTIME. PATIENT TAKES 1200 MG DAILY 08/03/23  Yes Metheney, Catherine D, MD  ipratropium (ATROVENT ) 0.06 % nasal spray Place 2 sprays into both nostrils 3 (three) times daily. 06/16/23  Yes Alvan Dorothyann BIRCH, MD  ipratropium-albuterol  (DUONEB) 0.5-2.5 (3) MG/3ML SOLN INHALE 3 MLS INTO THE LUNGS EVERY 2 (TWO) HOURS AS NEEDED (WHEEZING OR SHORTNESS OF BREATH). 10/28/23  Yes Alvan Dorothyann BIRCH, MD  levothyroxine  (SYNTHROID ) 88 MCG tablet TAKE 1  TABLET BY MOUTH EVERY DAY 08/10/23  Yes Alvan Dorothyann BIRCH, MD  losartan  (COZAAR ) 100 MG tablet TAKE 1 TABLET BY MOUTH EVERY DAY 12/25/23  Yes Alvan Dorothyann BIRCH, MD  ondansetron  (ZOFRAN ) 8 MG tablet Take 1 tablet (8 mg total) by mouth every 8 (eight) hours as needed for nausea or vomiting. 02/02/23  Yes Tat, Alm, MD  pantoprazole  (PROTONIX ) 40 MG tablet TAKE 1 TABLET BY MOUTH EVERY DAY 10/19/23  Yes Alvan Dorothyann BIRCH, MD  predniSONE  (DELTASONE ) 20 MG tablet Take 3 tablets (60 mg total) by mouth daily with breakfast for 3 days, THEN 2  tablets (40 mg total) daily with breakfast for 3 days, THEN 1 tablet (20 mg total) daily with breakfast for 3 days. 12/25/23 01/03/24 Yes Craft, Nola, PA  rosuvastatin  (CRESTOR ) 20 MG tablet Take 1 tablet (20 mg total) by mouth at bedtime. 05/14/23  Yes Alvan Dorothyann BIRCH, MD  theophylline  (THEODUR) 300 MG 12 hr tablet TAKE 1 TABLET BY MOUTH TWICE A DAY 10/19/23  Yes Alvan Dorothyann BIRCH, MD  tiZANidine  (ZANAFLEX ) 4 MG tablet Take 1 tablet (4 mg total) by mouth 2 (two) times daily as needed for muscle spasms. 12/22/23  Yes Alvan Dorothyann BIRCH, MD  traZODone  (DESYREL ) 50 MG tablet Take 50 mg by mouth at bedtime as needed for sleep. 03/23/23  Yes Euna Read, MD  TRELEGY ELLIPTA  100-62.5-25 MCG/ACT AEPB INHALE 1 PUFF BY MOUTH EVERY DAY 10/27/23  Yes Alvan Dorothyann BIRCH, MD  Vilazodone  HCl 20 MG TABS Take 20 mg by mouth daily.   Yes [provider]  AMBULATORY NON FORMULARY MEDICATION Battery powered portable oxygen  concentrator 10/28/23   Alvan Dorothyann BIRCH, MD  AMBULATORY NON FORMULARY MEDICATION Battery powered portable nebulizer 10/28/23   Alvan Dorothyann BIRCH, MD  amLODipine  (NORVASC ) 5 MG tablet Take 5 mg by mouth daily. Patient not taking: Reported on 12/29/2023    [provider]  Blood Glucose Monitoring Suppl DEVI 1 each by Does not apply route daily as needed. May substitute to any manufacturer covered by patient's insurance. 05/14/23   Alvan Dorothyann BIRCH, MD  predniSONE  (DELTASONE ) 5 MG tablet TAKE 1/2 TABLET (2.5 MG TOTAL) BY MOUTH DAILY WITH BREAKFAST. Patient not taking: Reported on 12/29/2023 07/28/23   Alvan Dorothyann BIRCH, MD      Allergies    Risperidone  and related, Aripiprazole, Fluoxetine hcl, Nsaids, Paroxetine, Ziprasidone  hcl, Chantix  [varenicline  tartrate], Metformin and related, Montelukast , Onglyza [saxagliptin ], Augmentin  [amoxicillin -pot clavulanate], and Fluoxetine hcl    Review of Systems   Review of Systems  Respiratory:  Positive for  shortness of breath.   All other systems reviewed and are negative.   Physical Exam Updated Vital Signs BP (!) 173/99   Pulse (!) 116   Temp 98.1 F (36.7 C) (Oral)   Resp (!) 22   SpO2 100%  Physical Exam Vitals and nursing note reviewed.  Constitutional:      General: She is not in acute distress.    Appearance: She is well-developed.  HENT:     Head: Normocephalic and atraumatic.     Mouth/Throat:     Pharynx: No oropharyngeal exudate.  Eyes:     General: No scleral icterus.       Right eye: No discharge.        Left eye: No discharge.     Conjunctiva/sclera: Conjunctivae normal.     Pupils: Pupils are equal, round, and reactive to light.  Neck:     Thyroid : No thyromegaly.  Vascular: No JVD.  Cardiovascular:     Rate and Rhythm: Normal rate and regular rhythm.     Heart sounds: Normal heart sounds. No murmur heard.    No friction rub. No gallop.  Pulmonary:     Effort: Pulmonary effort is normal.     Breath sounds: Wheezing present.     Comments: The patient speaks in full sentences, she has decreased lung sounds bilaterally with a slight prolonged expiratory phase and mild expiratory wheezing only on forced expiration. Abdominal:     General: Bowel sounds are normal. There is no distension.     Palpations: Abdomen is soft. There is no mass.     Tenderness: There is no abdominal tenderness.  Musculoskeletal:        General: No tenderness. Normal range of motion.     Cervical back: Normal range of motion and neck supple.  Lymphadenopathy:     Cervical: No cervical adenopathy.  Skin:    General: Skin is warm and dry.     Findings: No erythema or rash.  Neurological:     Mental Status: She is alert.     Coordination: Coordination normal.  Psychiatric:        Behavior: Behavior normal.     ED Results / Procedures / Treatments   Labs (all labs ordered are listed, but only abnormal results are displayed) Labs Reviewed  RESP PANEL BY RT-PCR (RSV, FLU A&B,  COVID)  RVPGX2 - Abnormal; Notable for the following components:      Result Value   Influenza A by PCR POSITIVE (*)    All other components within normal limits  COMPREHENSIVE METABOLIC PANEL - Abnormal; Notable for the following components:   Chloride 90 (*)    CO2 38 (*)    Glucose, Bld 183 (*)    Creatinine, Ser 1.21 (*)    AST 244 (*)    ALT 134 (*)    GFR, Estimated 51 (*)    All other components within normal limits  CBC - Abnormal; Notable for the following components:   WBC 10.6 (*)    Hemoglobin 11.3 (*)    MCHC 29.0 (*)    RDW 17.1 (*)    All other components within normal limits  BLOOD GAS, ARTERIAL - Abnormal; Notable for the following components:   pCO2 arterial 79 (*)    pO2, Arterial 78 (*)    Bicarbonate 45.7 (*)    Acid-Base Excess 16.3 (*)    All other components within normal limits  TROPONIN I (HIGH SENSITIVITY)    EKG EKG Interpretation Date/Time:  Tuesday December 29 2023 08:43:33 EST Ventricular Rate:  122 PR Interval:  139 QRS Duration:  76 QT Interval:  302 QTC Calculation: 431 R Axis:   -65  Text Interpretation: Sinus tachycardia Left anterior fascicular block ST elevation, consider inferior injury Since last tracing rate faster Confirmed by Cleotilde Rogue ([id number]) on 12/29/2023 9:11:29 AM  Radiology DG Chest Port 1 View Result Date: 12/29/2023 CLINICAL DATA:  Shortness of breath.  PICC line placement. EXAM: PORTABLE CHEST 1 VIEW COMPARISON:  Chest radiograph dated 12/10/2023. FINDINGS: Left-sided PICC with tip over the central SVC close to the cavoatrial junction. Bibasilar opacities again noted. No pleural effusion or pneumothorax. The cardiac silhouette is within normal limits. No acute osseous pathology. IMPRESSION: 1. Left-sided PICC with tip at the cavoatrial junction. 2. Bibasilar infiltrates. Electronically Signed   By: Vanetta Chou M.D.   On: 12/29/2023 09:14    Procedures .Critical  Care  Performed by: Cleotilde Rogue, MD Authorized by:  Cleotilde Rogue, MD   Critical care provider statement:    Critical care time (minutes):  45   Critical care time was exclusive of:  Separately billable procedures and treating other patients   Critical care was necessary to treat or prevent imminent or life-threatening deterioration of the following conditions:  Respiratory failure   Critical care was time spent personally by me on the following activities:  Development of treatment plan with patient or surrogate, discussions with consultants, evaluation of patient's response to treatment, examination of patient, obtaining history from patient or surrogate, review of old charts, re-evaluation of patient's condition, pulse oximetry, ordering and review of radiographic studies, ordering and review of laboratory studies and ordering and performing treatments and interventions   I assumed direction of critical care for this patient from another provider in my specialty: no     Care discussed with: admitting provider   Comments:           Medications Ordered in ED Medications  oseltamivir  (TAMIFLU ) capsule 75 mg (has no administration in time range)  methylPREDNISolone  sodium succinate (SOLU-MEDROL ) 125 mg/2 mL injection 125 mg (125 mg Intravenous Given 12/29/23 0922)  albuterol  (PROVENTIL ) (2.5 MG/3ML) 0.083% nebulizer solution (10 mg/hr Nebulization Given 12/29/23 9074)    ED Course/ Medical Decision Making/ A&P                                 Medical Decision Making Amount and/or Complexity of Data Reviewed Labs: ordered. Radiology: ordered.  Risk Prescription drug management. Decision regarding hospitalization.   This patient appears chronically ill on chronic oxygen  with severe advanced COPD.  She states that she still vapes despite giving up tobacco products in general.  She has no findings of swelling of her legs, I do not think pulmonary embolism is a likely source, she probably has some component of chronic or acute on chronic COPD  and respiratory failure.  Will check ABG, chest x-ray, she has had an numerous numbers of nebulized treatments today based on her report and the paramedics report, oxygen  of 98%, heart rate of about 120 bpm.  The patient is agreeable to the plan.   This patient presents to the ED for concern of shortness of breath, this involves an extensive number of treatment options, and is a complaint that carries with it a high risk of complications and morbidity.  The differential diagnosis includes recurrent pneumonia, pneumothorax, COPD exacerbation, acute on chronic respiratory failure   Co morbidities that complicate the patient evaluation  Chronic COPD respiratory failure   Additional history obtained:  Additional history obtained from care everywhere External records from outside source obtained and reviewed including admission to the hospital in December included diagnosis of bilateral pneumonia, hypoxic and hypercarbic respiratory failure requiring BiPAP and a positive blood culture for MRSA bacteremia.  He had an acute kidney injury with creatinine as high as 3.6   Lab Tests:  I Ordered, and personally interpreted labs.  The pertinent results include: ABG shows hypercarbia with a pCO2 of close to 80   Imaging Studies ordered:  I ordered imaging studies including chest x-ray I independently visualized and interpreted imaging which showed bibasilar infiltrates and a PICC line in appropriate location I agree with the radiologist interpretation   Cardiac Monitoring: / EKG:  The patient was maintained on a cardiac monitor.  I personally viewed and interpreted the  cardiac monitored which showed an underlying rhythm of: Sinus tachycardia   Consultations Obtained:  I requested consultation with the hospitalist,  and discussed lab and imaging findings as well as pertinent plan - they recommend: Admission to the hospital   Problem List / ED Course / Critical interventions / Medication  management  The patient has what appears to be acute hypoxic and hypercarbic respiratory failure which is likely secondary to underlying influenza.  She has received multiple nebulized treatments, she still has decreased lung sounds, she is mildly tachypneic, I am concerned about her rising CO2 levels and she will most likely need admission to the hospital and possibly BiPAP if this does not improve.  She does not appear to need intubation at this time but does have a very fragile respiratory status.  Discussed with hospitalist, we will admit to the hospital I ordered medication including nebulizer, steroids, oxygen  supplementation and oseltamivir  for influenza related acute hypoxic and hypercarbic respiratory failure Reevaluation of the patient after these medicines showed that the patient critically ill I have reviewed the patients home medicines and have made adjustments as needed   Social Determinants of Health:  Chronic lung disease   Test / Admission - Considered:  Admit to the high level of care         Final Clinical Impression(s) / ED Diagnoses Final diagnoses:  Acute respiratory failure with hypoxia and hypercapnia (HCC)  Influenza    Rx / DC Orders ED Discharge Orders     None         Cleotilde Rogue, MD 12/29/23 1021    Cleotilde Rogue, MD 12/29/23 1022

## 2023-12-29 NOTE — Care Management Obs Status (Signed)
 MEDICARE OBSERVATION STATUS NOTIFICATION   Patient Details  Name: Krystal Burgess MRN: [id number] Date of Birth: [birth year]   Medicare Observation Status Notification Given:  Yes    Beather Arbour 12/29/2023, 4:29 PM

## 2023-12-30 ENCOUNTER — Ambulatory Visit: Payer: Self-pay | Admitting: Family Medicine

## 2023-12-30 DIAGNOSIS — J9602 Acute respiratory failure with hypercapnia: Secondary | ICD-10-CM | POA: Diagnosis not present

## 2023-12-30 DIAGNOSIS — Z743 Need for continuous supervision: Secondary | ICD-10-CM | POA: Diagnosis not present

## 2023-12-30 DIAGNOSIS — R069 Unspecified abnormalities of breathing: Secondary | ICD-10-CM | POA: Diagnosis not present

## 2023-12-30 DIAGNOSIS — Z8249 Family history of ischemic heart disease and other diseases of the circulatory system: Secondary | ICD-10-CM | POA: Diagnosis not present

## 2023-12-30 DIAGNOSIS — J101 Influenza due to other identified influenza virus with other respiratory manifestations: Secondary | ICD-10-CM

## 2023-12-30 DIAGNOSIS — I120 Hypertensive chronic kidney disease with stage 5 chronic kidney disease or end stage renal disease: Secondary | ICD-10-CM | POA: Diagnosis present

## 2023-12-30 DIAGNOSIS — Z7401 Bed confinement status: Secondary | ICD-10-CM | POA: Diagnosis not present

## 2023-12-30 DIAGNOSIS — Z87891 Personal history of nicotine dependence: Secondary | ICD-10-CM | POA: Diagnosis not present

## 2023-12-30 DIAGNOSIS — K219 Gastro-esophageal reflux disease without esophagitis: Secondary | ICD-10-CM

## 2023-12-30 DIAGNOSIS — J9611 Chronic respiratory failure with hypoxia: Secondary | ICD-10-CM

## 2023-12-30 DIAGNOSIS — Z6841 Body Mass Index (BMI) 40.0 and over, adult: Secondary | ICD-10-CM | POA: Diagnosis not present

## 2023-12-30 DIAGNOSIS — Z833 Family history of diabetes mellitus: Secondary | ICD-10-CM | POA: Diagnosis not present

## 2023-12-30 DIAGNOSIS — Z8614 Personal history of Methicillin resistant Staphylococcus aureus infection: Secondary | ICD-10-CM | POA: Diagnosis not present

## 2023-12-30 DIAGNOSIS — F3132 Bipolar disorder, current episode depressed, moderate: Secondary | ICD-10-CM | POA: Diagnosis not present

## 2023-12-30 DIAGNOSIS — E782 Mixed hyperlipidemia: Secondary | ICD-10-CM | POA: Diagnosis not present

## 2023-12-30 DIAGNOSIS — G4733 Obstructive sleep apnea (adult) (pediatric): Secondary | ICD-10-CM | POA: Diagnosis present

## 2023-12-30 DIAGNOSIS — Z1152 Encounter for screening for COVID-19: Secondary | ICD-10-CM | POA: Diagnosis not present

## 2023-12-30 DIAGNOSIS — J9612 Chronic respiratory failure with hypercapnia: Secondary | ICD-10-CM | POA: Diagnosis present

## 2023-12-30 DIAGNOSIS — Z83438 Family history of other disorder of lipoprotein metabolism and other lipidemia: Secondary | ICD-10-CM | POA: Diagnosis not present

## 2023-12-30 DIAGNOSIS — Z886 Allergy status to analgesic agent status: Secondary | ICD-10-CM | POA: Diagnosis not present

## 2023-12-30 DIAGNOSIS — Z79899 Other long term (current) drug therapy: Secondary | ICD-10-CM | POA: Diagnosis not present

## 2023-12-30 DIAGNOSIS — F319 Bipolar disorder, unspecified: Secondary | ICD-10-CM | POA: Diagnosis present

## 2023-12-30 DIAGNOSIS — R6889 Other general symptoms and signs: Secondary | ICD-10-CM | POA: Diagnosis not present

## 2023-12-30 DIAGNOSIS — J441 Chronic obstructive pulmonary disease with (acute) exacerbation: Secondary | ICD-10-CM

## 2023-12-30 DIAGNOSIS — N185 Chronic kidney disease, stage 5: Secondary | ICD-10-CM | POA: Diagnosis present

## 2023-12-30 DIAGNOSIS — E039 Hypothyroidism, unspecified: Secondary | ICD-10-CM | POA: Diagnosis not present

## 2023-12-30 DIAGNOSIS — I10 Essential (primary) hypertension: Secondary | ICD-10-CM | POA: Diagnosis not present

## 2023-12-30 DIAGNOSIS — J45901 Unspecified asthma with (acute) exacerbation: Secondary | ICD-10-CM | POA: Diagnosis present

## 2023-12-30 DIAGNOSIS — F419 Anxiety disorder, unspecified: Secondary | ICD-10-CM | POA: Diagnosis present

## 2023-12-30 DIAGNOSIS — R0602 Shortness of breath: Secondary | ICD-10-CM | POA: Diagnosis present

## 2023-12-30 DIAGNOSIS — Z888 Allergy status to other drugs, medicaments and biological substances status: Secondary | ICD-10-CM | POA: Diagnosis not present

## 2023-12-30 LAB — COMPREHENSIVE METABOLIC PANEL
ALT: 101 U/L — ABNORMAL HIGH (ref 0–44)
AST: 120 U/L — ABNORMAL HIGH (ref 15–41)
Albumin: 3.2 g/dL — ABNORMAL LOW (ref 3.5–5.0)
Alkaline Phosphatase: 66 U/L (ref 38–126)
Anion gap: 10 (ref 5–15)
BUN: 24 mg/dL — ABNORMAL HIGH (ref 6–20)
CO2: 39 mmol/L — ABNORMAL HIGH (ref 22–32)
Calcium: 9.5 mg/dL (ref 8.9–10.3)
Chloride: 89 mmol/L — ABNORMAL LOW (ref 98–111)
Creatinine, Ser: 1.36 mg/dL — ABNORMAL HIGH (ref 0.44–1.00)
GFR, Estimated: 45 mL/min — ABNORMAL LOW (ref >60)
Glucose, Bld: 88 mg/dL (ref 70–99)
Potassium: 4.2 mmol/L (ref 3.5–5.1)
Sodium: 138 mmol/L (ref 135–145)
Total Bilirubin: 0.5 mg/dL (ref 0.0–1.2)
Total Protein: 7.3 g/dL (ref 6.5–8.1)

## 2023-12-30 LAB — CBC
HCT: 37.1 % (ref 36.0–46.0)
Hemoglobin: 10.8 g/dL — ABNORMAL LOW (ref 12.0–15.0)
MCH: 28 pg (ref 26.0–34.0)
MCHC: 29.1 g/dL — ABNORMAL LOW (ref 30.0–36.0)
MCV: 96.1 fL (ref 80.0–100.0)
Platelets: 238 10*3/uL (ref 150–400)
RBC: 3.86 MIL/uL — ABNORMAL LOW (ref 3.87–5.11)
RDW: 16.8 % — ABNORMAL HIGH (ref 11.5–15.5)
WBC: 7.1 10*3/uL (ref 4.0–10.5)
nRBC: 0 % (ref 0.0–0.2)

## 2023-12-30 LAB — MAGNESIUM: Magnesium: 1.9 mg/dL (ref 1.7–2.4)

## 2023-12-30 MED ORDER — GUAIFENESIN-DM 100-10 MG/5ML PO SYRP
5.0000 mL | ORAL_SOLUTION | ORAL | Status: DC | PRN
  Administered 2023-12-31: 5 mL via ORAL
  Filled 2023-12-30: qty 5

## 2023-12-30 MED ORDER — IPRATROPIUM-ALBUTEROL 0.5-2.5 (3) MG/3ML IN SOLN
3.0000 mL | Freq: Four times a day (QID) | RESPIRATORY_TRACT | Status: DC
  Administered 2023-12-30 – 2023-12-31 (×5): 3 mL via RESPIRATORY_TRACT
  Filled 2023-12-30 (×5): qty 3

## 2023-12-30 MED ORDER — PNEUMOCOCCAL 20-VAL CONJ VACC 0.5 ML IM SUSY: 0.5000 mL | PREFILLED_SYRINGE | INTRAMUSCULAR | Status: DC

## 2023-12-30 MED ORDER — LEVALBUTEROL HCL 0.63 MG/3ML IN NEBU
0.6300 mg | INHALATION_SOLUTION | Freq: Four times a day (QID) | RESPIRATORY_TRACT | Status: DC
  Administered 2023-12-30: 0.63 mg via RESPIRATORY_TRACT
  Filled 2023-12-30 (×2): qty 3

## 2023-12-30 MED ORDER — HYDRALAZINE HCL 20 MG/ML IJ SOLN
10.0000 mg | Freq: Three times a day (TID) | INTRAMUSCULAR | Status: DC | PRN
  Administered 2023-12-30: 10 mg via INTRAVENOUS
  Filled 2023-12-30: qty 1

## 2023-12-30 MED ORDER — LABETALOL HCL 5 MG/ML IV SOLN: 10.0000 mg | INTRAVENOUS | Status: DC | PRN

## 2023-12-30 MED ORDER — INFLUENZA VIRUS VACC SPLIT PF (FLUZONE) 0.5 ML IM SUSY: 0.5000 mL | PREFILLED_SYRINGE | INTRAMUSCULAR | Status: DC

## 2023-12-30 MED ORDER — AMLODIPINE BESYLATE 5 MG PO TABS
5.0000 mg | ORAL_TABLET | Freq: Once | ORAL | Status: AC
  Administered 2023-12-30: 5 mg via ORAL
  Filled 2023-12-30: qty 1

## 2023-12-30 MED ORDER — CHLORHEXIDINE GLUCONATE CLOTH 2 % EX PADS
6.0000 | MEDICATED_PAD | Freq: Every day | CUTANEOUS | Status: DC
  Administered 2023-12-30 – 2023-12-31 (×2): 6 via TOPICAL

## 2023-12-30 MED ORDER — ARFORMOTEROL TARTRATE 15 MCG/2ML IN NEBU
15.0000 ug | INHALATION_SOLUTION | Freq: Two times a day (BID) | RESPIRATORY_TRACT | Status: DC
  Administered 2023-12-30 – 2023-12-31 (×3): 15 ug via RESPIRATORY_TRACT
  Filled 2023-12-30 (×3): qty 2

## 2023-12-30 MED ORDER — ALBUTEROL SULFATE (2.5 MG/3ML) 0.083% IN NEBU
2.5000 mg | INHALATION_SOLUTION | RESPIRATORY_TRACT | Status: DC | PRN
  Administered 2023-12-31 (×2): 2.5 mg via RESPIRATORY_TRACT
  Filled 2023-12-30 (×2): qty 3

## 2023-12-30 MED ORDER — METHYLPREDNISOLONE SODIUM SUCC 40 MG IJ SOLR
40.0000 mg | Freq: Two times a day (BID) | INTRAMUSCULAR | Status: DC
  Administered 2023-12-30 – 2023-12-31 (×3): 40 mg via INTRAVENOUS
  Filled 2023-12-30 (×3): qty 1

## 2023-12-30 MED ORDER — AMLODIPINE BESYLATE 5 MG PO TABS
10.0000 mg | ORAL_TABLET | Freq: Every day | ORAL | Status: DC
  Administered 2023-12-31: 10 mg via ORAL
  Filled 2023-12-30: qty 2

## 2023-12-30 NOTE — Progress Notes (Signed)
 Patient called out and said she couldn't breath. This RN checked on pt. O2 sats 77-78% with 3L [place]. Attempted to coach patient to sit back and take deep breaths. RT at bedside. Bumped o2 up to 6L. Sats improved to 90%. Jamie S, RT gave breathing treatment and sats improved to 94%. Dropped patient back down to 4L [place] and sats are staying around 90-91%. Dr. Ricky notified. See new orders.

## 2023-12-30 NOTE — Progress Notes (Signed)
 Prepared to give patient 4am breathing treatment, but patient was finally sleeping and seemed to be resting comfortably with no distress when I entered room.  Spoke with RN and both RN and this RT did not want to wake patient for treatment since she has been up most of the night.  Will continue to monitor.

## 2023-12-30 NOTE — Plan of Care (Signed)
  Problem: Acute Rehab PT Goals(only PT should resolve) Goal: Pt Will Go Supine/Side To Sit Outcome: Progressing Flowsheets (Taken 12/30/2023 1416) Pt will go Supine/Side to Sit: Independently Goal: Patient Will Transfer Sit To/From Stand Outcome: Progressing Flowsheets (Taken 12/30/2023 1416) Patient will transfer sit to/from stand:  with modified independence  Independently Goal: Pt Will Transfer Bed To Chair/Chair To Bed Outcome: Progressing Flowsheets (Taken 12/30/2023 1416) Pt will Transfer Bed to Chair/Chair to Bed: with modified independence Goal: Pt Will Ambulate Outcome: Progressing Flowsheets (Taken 12/30/2023 1416) Pt will Ambulate:  25 feet  with contact guard assist  with rolling walker  with minimal assist   2:17 PM, 12/30/23 Lynwood Music, MPT Physical Therapist with [facility] [phone] office [phone] mobile phone

## 2023-12-30 NOTE — Progress Notes (Signed)
 Spoke with Krystal Burgess at [facility] and when it is time for patient to be discharge, they will deliver her oxygen here to [facility] before she leaves. Just call and ask for Krystal Burgess and she will know the information needed.

## 2023-12-30 NOTE — Progress Notes (Signed)
 Progress Note   Patient: Krystal Burgess FMW:[id number] DOB: 02-09-1961 DOA: 12/29/2023     1 DOS: the patient was seen and examined on 12/30/2023   Brief hospital admission course: As per H&P written by Dr. Maree on 12/29/2023 Krystal Burgess is a 63 y.o. female with medical history significant for asthma/COPD on home 2-3 L nasal cannula oxygen , hypothyroidism, GERD, hypertension, dyslipidemia, morbid obesity who presented to the ED with worsening shortness of breath over the last 2-3 days.  She was recently discharged from [facility] a little over a week ago with acute on chronic hypoxemic respiratory failure secondary to COPD exacerbation and community-acquired pneumonia.  She supposedly had MRSA bacteremia and was started on daptomycin but it is uncertain how compliant she has been with this medication and states that she only received this medication whenever her daughter would give it to her.  She apparently has quite a bit of problems with her daughter at home and states that there are cats at home which aggravate her respiratory symptoms.  She denies any recent fevers or chills.   ED Course: Vital signs with some tachycardia and elevated blood pressure readings.  Patient positive for influenza A.  Chest x-ray with some bibasilar infiltrates.  She was started on Tamiflu  and given a dose of steroids and breathing treatments.  She remains on her baseline nasal cannula oxygen .  ABG demonstrating elevated pCO2 levels, however with normalized pH levels.  AST and ALT levels elevated.  Assessment and Plan: 1-shortness of breath secondary to influenza infection and asthma/COPD exacerbation -Continue treatment with Tamiflu  -Continue as needed bronchodilators and the use of theophylline  and mucolytic's. -Patient started on steroids and Brovana  -Will follow clinical response.  2-mild transaminitis -Continue to follow LFTs trend (improving). -Minimize hepatotoxic agents.  3-dyslipidemia -Holding statin in the  setting of transaminitis -Planning to resume at time of discharge -Heart healthy diet discussed with patient.  4-uncontrolled hypertension -Continue to follow vital sign -Norvasc  dosage has been adjusted -Continue as needed labetalol  and hydralazine  -Low-sodium diet discussed with patient.  5-hypothyroidism -Continue Synthroid .  6-GERD -Continue PPI  7-anxiety -Continue as needed Xanax  -Follow mood stability.  8-morbid obesity -Body mass index is 42.91 kg/m. -Low-calorie diet, portion control and increase physical activity discussed with patient.  9-recent MRSA bacteremia -Patient has been receiving IV antibiotics (unclear compliance) -No fever; follow blood culture results and if no growth stop treatment and discontinue PICC line. -She has been on medication for over 8 weeks by now.  10-chronic respiratory failure with hypoxia -So far no using accessory muscles and demonstrating good saturation on chronic oxygen  supplementation  Subjective:  Having difficulty speaking in full sentences and complaining of ongoing mildly productive coughing spells.  Physical Exam: Vitals:   12/30/23 0855 12/30/23 0900 12/30/23 0915 12/30/23 0930  BP: (!) 186/101 (!) 182/111 (!) 158/97 (!) 148/114  Pulse:  88 78 81  Resp:  (!) 25 (!) 21 16  Temp:      TempSrc:      SpO2:  97% 92% 93%  Weight:      Height:       General exam: Alert, awake, oriented x 3; expressing mild intermittent productive coughing spells and worsening respiratory status.  Mild difficulty speaking in full sentences appreciated on exam. Respiratory system: Fair air movement bilaterally; positive rhonchi at and positive expiratory wheezing. Cardiovascular system: No rubs, no gallops, and unable to assess JVD with body habitus. At time of evaluation rate control Gastrointestinal system: Abdomen is obese, nondistended,  soft and nontender. No organomegaly or masses felt. Normal bowel sounds heard. Central nervous  system: No focal neurological deficits. Extremities: No cyanosis or clubbing. Skin: No petechiae; PICC line in her left upper extremity appreciated (present at time of admission). Psychiatry: Judgement and insight appear normal.  Overall competent to make her own decisions and is able to follow commands appropriately.  Data Reviewed: Magnesium : 1.9 CBC: WBC 7.1, hemoglobin 10.8 and platelet count 238K Comprehensive metabolic panel: Sodium 138, potassium 4.2, chloride 89, bicarb 29, BUN 24, creatinine 1.36, albumin 3.2, AST 120, ALT 101 and GFR 45.  Family Communication: Patient's friend Gustav over the phone updated on plan of care.  Disposition: Status is: Inpatient Remains inpatient appropriate because: Continue treatment for acute asthma/COPD exacerbation.   Planned Discharge Destination: Home  Time spent: 50 minutes  Author: Eric Nunnery, MD 12/30/2023 10:19 AM  For on call review [email or web address].

## 2023-12-30 NOTE — Telephone Encounter (Signed)
 Patient's friend, Gustav, called in on behalf of patient who is in the ED. Gustav is not listed on patient's chart as DPR. Gustav is not physically with patient at the moment, so I was unable to obtain verbal consent. Myles Gustav that I would be able to speak to her if she were to physically go to the ED and I could obtain verbal consent from patient. Also advised that patient could fill out a DPR form in her PCP office in order to prevent this from happening in the future. Gustav complied.   Copied from CRM [id number]. Topic: Clinical - Red Word Triage >> Dec 30, 2023  8:45 AM Graeme ORN wrote: Red Word that prompted transfer to Nurse Triage: Patient currently in Emergency department since yesterday not getting any help. States asked for breathing treatment hours ago and has not gotten one yet. And no access to her oxygen  Reason for Disposition  Health Information question, no triage required and triager able to answer question  Answer Assessment - Initial Assessment Questions 1. REASON FOR CALL or QUESTION: What is your reason for calling today? or How can I best help you? or What question do you have that I can help answer?     Patient's friend, Gustav, called in on behalf of patient who is in the ED. Gustav is not listed on patient's chart as DPR. Gustav is not physically with patient at the moment, so I was unable to obtain verbal consent.  Protocols used: Information Only Call - No Triage-A-AH

## 2023-12-30 NOTE — ED Notes (Signed)
 ED TO INPATIENT HANDOFF REPORT  ED Nurse Name and Phone #: Keygan Dumond   S Name/Age/Gender Krystal Burgess Sharps 63 y.o. female Room/Bed: APA12/APA12  Code Status   Code Status: Full Code  Home/SNF/Other Home Patient oriented to: self, place, time, and situation Is this baseline? Yes   Triage Complete: Triage complete  Chief Complaint Acute hypercapnic respiratory failure (HCC) [J96.02] Influenza A [J10.1] SOB (shortness of breath) [R06.02]  Triage Note Pt called ems due to shob. Has been taking breathing tx at home and received one in route. Pt able to complete sentences, pt anxious and restless. Edp in with pt upon arrival. Color wnl. A/o.    Allergies Allergies  Allergen Reactions   Risperidone  And Related Shortness Of Breath   Aripiprazole Other (See Comments)    REACTION: muscle jerks    Fluoxetine Hcl Other (See Comments)    REACTION: Intolerant   Nsaids Other (See Comments)    Upper GI bleed CKD 3    Paroxetine Other (See Comments)    REACTION: Intolerance    Ziprasidone  Hcl Other (See Comments)    shakes    Chantix  [Varenicline  Tartrate] Other (See Comments)    messing with my mood   Metformin And Related Nausea Only   Montelukast  Other (See Comments)    it kept me sick with an upper respiratory infection   Onglyza [Saxagliptin ] Nausea Only   Augmentin  [Amoxicillin -Pot Clavulanate] Other (See Comments)    Gets yeast infection every time    Fluoxetine Hcl     REACTION: Intolerant    Level of Care/Admitting Diagnosis ED Disposition     ED Disposition  Admit   Condition  --   Comment  Hospital Area: [facility] [[id number]]  Level of Care: Telemetry [5]  Covid Evaluation: Confirmed COVID Negative  Diagnosis: SOB (shortness of breath) [758119]  Admitting Physician: RICKY FINES [[id number]]  Attending Physician: RICKY FINES [[id number]]  Certification:: I certify this patient will need inpatient services for at least 2 midnights           B Medical/Surgery History Past Medical History:  Diagnosis Date   Allergy    COPD (chronic obstructive pulmonary disease) (HCC)    Glaucoma    Hyperlipidemia    Hypertension    MRSA (methicillin resistant staph aureus) culture positive    Pain    Renal disorder    Thyroid  disease    Past Surgical History:  Procedure Laterality Date   CESAREAN SECTION     CHOLECYSTECTOMY     lipoma removal     RT shoulder   LUMBAR SPINE SURGERY  12/23/2011   Dr. Delores, L4-5   pilonydal cyst  12/22/1992   TOTAL ABDOMINAL HYSTERECTOMY  1999   for abnormal cervical cells and fibroids     A IV Location/Drains/Wounds Patient Lines/Drains/Airways Status     Active Line/Drains/Airways     Name Placement date Placement time Site Days   PICC Single Lumen Left --  --  --  --   External Urinary Catheter 01/29/23  2015  --  335   External Urinary Catheter 12/29/23  0913  --  1            Intake/Output Last 24 hours No intake or output data in the 24 hours ending 12/30/23 1042  Labs/Imaging Results for orders placed or performed during the hospital encounter of 12/29/23 (from the past 48 hours)  Resp panel by RT-PCR (RSV, Flu A&B, Covid) Anterior Nasal Swab     Status:  Abnormal   Collection Time: 12/29/23  8:58 AM   Specimen: Anterior Nasal Swab  Result Value Ref Range   SARS Coronavirus 2 by RT PCR NEGATIVE NEGATIVE    Comment: (NOTE) SARS-CoV-2 target nucleic acids are NOT DETECTED.  The SARS-CoV-2 RNA is generally detectable in upper respiratory specimens during the acute phase of infection. The lowest concentration of SARS-CoV-2 viral copies this assay can detect is 138 copies/mL. A negative result does not preclude SARS-Cov-2 infection and should not be used as the sole basis for treatment or other patient management decisions. A negative result may occur with  improper specimen collection/handling, submission of specimen other than nasopharyngeal swab, presence of viral  mutation(s) within the areas targeted by this assay, and inadequate number of viral copies(<138 copies/mL). A negative result must be combined with clinical observations, patient history, and epidemiological information. The expected result is Negative.  Fact Sheet for Patients:  bloggercourse.com  Fact Sheet for Healthcare Providers:  seriousbroker.it  This test is no t yet approved or cleared by the [place]  FDA and  has been authorized for detection and/or diagnosis of SARS-CoV-2 by FDA under an Emergency Use Authorization (EUA). This EUA will remain  in effect (meaning this test can be used) for the duration of the COVID-19 declaration under Section 564(b)(1) of the Act, 21 U.S.C.section 360bbb-3(b)(1), unless the authorization is terminated  or revoked sooner.       Influenza A by PCR POSITIVE (A) NEGATIVE   Influenza B by PCR NEGATIVE NEGATIVE    Comment: (NOTE) The Xpert Xpress SARS-CoV-2/FLU/RSV plus assay is intended as an aid in the diagnosis of influenza from Nasopharyngeal swab specimens and should not be used as a sole basis for treatment. Nasal washings and aspirates are unacceptable for Xpert Xpress SARS-CoV-2/FLU/RSV testing.  Fact Sheet for Patients: bloggercourse.com  Fact Sheet for Healthcare Providers: seriousbroker.it  This test is not yet approved or cleared by the [place]  FDA and has been authorized for detection and/or diagnosis of SARS-CoV-2 by FDA under an Emergency Use Authorization (EUA). This EUA will remain in effect (meaning this test can be used) for the duration of the COVID-19 declaration under Section 564(b)(1) of the Act, 21 U.S.C. section 360bbb-3(b)(1), unless the authorization is terminated or revoked.     Resp Syncytial Virus by PCR NEGATIVE NEGATIVE    Comment: (NOTE) Fact Sheet for  Patients: bloggercourse.com  Fact Sheet for Healthcare Providers: seriousbroker.it  This test is not yet approved or cleared by the [place]  FDA and has been authorized for detection and/or diagnosis of SARS-CoV-2 by FDA under an Emergency Use Authorization (EUA). This EUA will remain in effect (meaning this test can be used) for the duration of the COVID-19 declaration under Section 564(b)(1) of the Act, 21 U.S.C. section 360bbb-3(b)(1), unless the authorization is terminated or revoked.  Performed at [facility], [address] [place], [place] [address]   Blood gas, arterial     Status: Abnormal   Collection Time: 12/29/23  9:02 AM  Result Value Ref Range   pH, Arterial 7.37 7.35 - 7.45   pCO2 arterial 79 (HH) 32 - 48 mmHg    Comment: CRITICAL RESULT CALLED TO, READ BACK BY AND VERIFIED WITH: GANT,E AT 9:10AM ON 12/29/23 BY FESTERMAN,C    pO2, Arterial 78 (L) 83 - 108 mmHg   Bicarbonate 45.7 (H) 20.0 - 28.0 mmol/L   Acid-Base Excess 16.3 (H) 0.0 - 2.0 mmol/L   O2 Saturation 95.8 %   Patient temperature  37.0    Collection site LEFT RADIAL    Drawn by [id number]    Allens test (pass/fail) PASS PASS    Comment: Performed at [facility], [address] [place], [place] [address]  Comprehensive metabolic panel     Status: Abnormal   Collection Time: 12/29/23  9:20 AM  Result Value Ref Range   Sodium 138 135 - 145 mmol/L   Potassium 4.0 3.5 - 5.1 mmol/L   Chloride 90 (L) 98 - 111 mmol/L   CO2 38 (H) 22 - 32 mmol/L   Glucose, Bld 183 (H) 70 - 99 mg/dL    Comment: Glucose reference range applies only to samples taken after fasting for at least 8 hours.   BUN 15 6 - 20 mg/dL   Creatinine, Ser 8.78 (H) 0.44 - 1.00 mg/dL   Calcium  9.5 8.9 - 10.3 mg/dL   Total Protein 7.9 6.5 - 8.1 g/dL   Albumin 3.5 3.5 - 5.0 g/dL   AST 755 (H) 15 - 41 U/L   ALT 134 (H) 0 - 44 U/L   Alkaline Phosphatase 79 38 - 126 U/L   Total Bilirubin 0.2  0.0 - 1.2 mg/dL   GFR, Estimated 51 (L) >60 mL/min    Comment: (NOTE) Calculated using the CKD-EPI Creatinine Equation (2021)    Anion gap 10 5 - 15    Comment: Performed at [facility], [address], [place], [place] [address]  CBC     Status: Abnormal   Collection Time: 12/29/23  9:20 AM  Result Value Ref Range   WBC 10.6 (H) 4.0 - 10.5 K/uL   RBC 4.00 3.87 - 5.11 MIL/uL   Hemoglobin 11.3 (L) 12.0 - 15.0 g/dL   HCT 60.9 63.9 - 53.9 %   MCV 97.5 80.0 - 100.0 fL   MCH 28.3 26.0 - 34.0 pg   MCHC 29.0 (L) 30.0 - 36.0 g/dL   RDW 82.8 (H) 88.4 - 84.4 %   Platelets 281 150 - 400 K/uL   nRBC 0.0 0.0 - 0.2 %    Comment: Performed at [facility], [address] [place], [place] [address]  Troponin I (High Sensitivity)     Status: Abnormal   Collection Time: 12/29/23  9:20 AM  Result Value Ref Range   Troponin I (High Sensitivity) 35 (H) <18 ng/L    Comment: (NOTE) Elevated high sensitivity troponin I (hsTnI) values and significant  changes across serial measurements may suggest ACS but many other  chronic and acute conditions are known to elevate hsTnI results.  Refer to the Links section for chest pain algorithms and additional  guidance. Performed at [facility], [address], [place], [place] [address]   Culture, blood (Routine X 2) w Reflex to ID Panel     Status: None (Preliminary result)   Collection Time: 12/29/23 12:28 PM   Specimen: BLOOD  Result Value Ref Range   Specimen Description BLOOD PICC    Special Requests      BOTTLES DRAWN AEROBIC AND ANAEROBIC Blood Culture adequate volume   Culture      NO GROWTH < 24 HOURS Performed at [facility], [address] [place], [place] [address]    Report Status PENDING   Culture, blood (Routine X 2) w Reflex to ID Panel     Status: None (Preliminary result)   Collection Time: 12/29/23 12:28 PM   Specimen: BLOOD  Result Value Ref Range   Specimen Description BLOOD BLOOD RIGHT HAND AEROBIC BOTTLE ONLY  Special  Requests      BOTTLES DRAWN AEROBIC ONLY Blood Culture adequate volume   Culture      NO GROWTH < 24 HOURS Performed at [facility], [address] [place], [place] [address]    Report Status PENDING   Magnesium      Status: None   Collection Time: 12/30/23  4:51 AM  Result Value Ref Range   Magnesium  1.9 1.7 - 2.4 mg/dL    Comment: Performed at [facility], [address] [place], [place] [address]  CBC     Status: Abnormal   Collection Time: 12/30/23  4:51 AM  Result Value Ref Range   WBC 7.1 4.0 - 10.5 K/uL   RBC 3.86 (L) 3.87 - 5.11 MIL/uL   Hemoglobin 10.8 (L) 12.0 - 15.0 g/dL   HCT 62.8 63.9 - 53.9 %   MCV 96.1 80.0 - 100.0 fL   MCH 28.0 26.0 - 34.0 pg   MCHC 29.1 (L) 30.0 - 36.0 g/dL   RDW 83.1 (H) 88.4 - 84.4 %   Platelets 238 150 - 400 K/uL   nRBC 0.0 0.0 - 0.2 %    Comment: Performed at [facility], [address] [place], [place] [address]  Comprehensive metabolic panel     Status: Abnormal   Collection Time: 12/30/23  4:51 AM  Result Value Ref Range   Sodium 138 135 - 145 mmol/L   Potassium 4.2 3.5 - 5.1 mmol/L   Chloride 89 (L) 98 - 111 mmol/L   CO2 39 (H) 22 - 32 mmol/L   Glucose, Bld 88 70 - 99 mg/dL    Comment: Glucose reference range applies only to samples taken after fasting for at least 8 hours.   BUN 24 (H) 6 - 20 mg/dL   Creatinine, Ser 8.63 (H) 0.44 - 1.00 mg/dL   Calcium  9.5 8.9 - 10.3 mg/dL   Total Protein 7.3 6.5 - 8.1 g/dL   Albumin 3.2 (L) 3.5 - 5.0 g/dL   AST 879 (H) 15 - 41 U/L   ALT 101 (H) 0 - 44 U/L   Alkaline Phosphatase 66 38 - 126 U/L   Total Bilirubin 0.5 0.0 - 1.2 mg/dL   GFR, Estimated 45 (L) >60 mL/min    Comment: (NOTE) Calculated using the CKD-EPI Creatinine Equation (2021)    Anion gap 10 5 - 15    Comment: Performed at [facility], [address] [place], [place] [address]   *Note: Due to a large number of results and/or encounters for the requested time period, some results have not been displayed. A complete set of  results can be found in Results Review.   DG Chest Port 1 View Result Date: 12/29/2023 CLINICAL DATA:  Shortness of breath.  PICC line placement. EXAM: PORTABLE CHEST 1 VIEW COMPARISON:  Chest radiograph dated 12/10/2023. FINDINGS: Left-sided PICC with tip over the central SVC close to the cavoatrial junction. Bibasilar opacities again noted. No pleural effusion or pneumothorax. The cardiac silhouette is within normal limits. No acute osseous pathology. IMPRESSION: 1. Left-sided PICC with tip at the cavoatrial junction. 2. Bibasilar infiltrates. Electronically Signed   By: Vanetta Chou M.D.   On: 12/29/2023 09:14    Pending Labs Unresulted Labs (From admission, onward)     Start     Ordered   12/31/23 0500  CBC  Tomorrow morning,   R        12/30/23 1002   12/31/23 0500  Basic metabolic panel  Tomorrow morning,  R        12/30/23 1002            Vitals/Pain Today's Vitals   12/30/23 0900 12/30/23 0915 12/30/23 0930 12/30/23 1030  BP: (!) 182/111 (!) 158/97 (!) 148/114 (!) 168/99  Pulse: 88 78 81 91  Resp: (!) 25 (!) 21 16 19   Temp:      TempSrc:      SpO2: 97% 92% 93% 93%  Weight:      Height:      PainSc:        Isolation Precautions No active isolations  Medications Medications  losartan  (COZAAR ) tablet 100 mg (100 mg Oral Given 12/30/23 0854)  ALPRAZolam  (XANAX ) tablet 1 mg (1 mg Oral Given 12/30/23 0855)  traZODone  (DESYREL ) tablet 50 mg (50 mg Oral Given 12/29/23 2354)  Vilazodone  HCl TABS 20 mg (20 mg Oral Not Given 12/29/23 1210)  levothyroxine  (SYNTHROID ) tablet 88 mcg (88 mcg Oral Given 12/30/23 0608)  pantoprazole  (PROTONIX ) EC tablet 40 mg (40 mg Oral Given 12/29/23 2108)  folic acid  (FOLVITE ) tablet 1 mg (1 mg Oral Given 12/30/23 0854)  heparin  injection 5,000 Units (5,000 Units Subcutaneous Given 12/30/23 9391)  acetaminophen  (TYLENOL ) tablet 650 mg (has no administration in time range)    Or  acetaminophen  (TYLENOL ) suppository 650 mg (has no administration in time  range)  ondansetron  (ZOFRAN ) tablet 4 mg ( Oral See Alternative 12/30/23 0456)    Or  ondansetron  (ZOFRAN ) injection 4 mg (4 mg Intravenous Given 12/30/23 0456)  oseltamivir  (TAMIFLU ) capsule 75 mg (75 mg Oral Given 12/30/23 0854)  Vilazodone  HCl TABS 20 mg (20 mg Oral Not Given 12/29/23 1721)  oxyCODONE -acetaminophen  (PERCOCET/ROXICET) 5-325 MG per tablet 1-2 tablet (1 tablet Oral Given 12/30/23 0022)  theophylline  (THEODUR) 12 hr tablet 300 mg (has no administration in time range)  gabapentin  (NEURONTIN ) capsule 1,200 mg (1,200 mg Oral Given 12/29/23 2108)  tiZANidine  (ZANAFLEX ) tablet 4 mg (4 mg Oral Given 12/29/23 2108)  melatonin tablet 6 mg (6 mg Oral Given 12/29/23 2107)  amLODipine  (NORVASC ) tablet 10 mg (has no administration in time range)  hydrALAZINE  (APRESOLINE ) injection 10 mg (has no administration in time range)  amLODipine  (NORVASC ) tablet 5 mg (has no administration in time range)  levalbuterol  (XOPENEX ) nebulizer solution 0.63 mg (has no administration in time range)  methylPREDNISolone  sodium succinate (SOLU-MEDROL ) 40 mg/mL injection 40 mg (has no administration in time range)  labetalol  (NORMODYNE ) injection 10 mg (has no administration in time range)  arformoterol  (BROVANA ) nebulizer solution 15 mcg (has no administration in time range)  methylPREDNISolone  sodium succinate (SOLU-MEDROL ) 125 mg/2 mL injection 125 mg (125 mg Intravenous Given 12/29/23 0922)  albuterol  (PROVENTIL ) (2.5 MG/3ML) 0.083% nebulizer solution (10 mg/hr Nebulization Given 12/29/23 0925)  oseltamivir  (TAMIFLU ) capsule 75 mg (75 mg Oral Given 12/29/23 1048)    Mobility walks with device     Focused Assessments Pulmonary Assessment Handoff:  Lung sounds: Bilateral Breath Sounds: Diminished O2 Device: Nasal Cannula O2 Flow Rate (L/min): 3 L/min    R Recommendations: See Admitting Provider Note  Report given to:   Additional Notes:

## 2023-12-30 NOTE — TOC Initial Note (Signed)
 Transition of Care [facility]) - Initial/Assessment Note    Patient Details  Name: Krystal Burgess MRN: [id number] Date of Birth: 03/10/1961  [facility]) CM/SW Contact:    Lucie Lunger, LCSWA Phone Number: 12/30/2023, 3:15 PM  Clinical Narrative:                 Pt is high risk for readmission. CSW updated that there are social concerns about pt returning to her home. CSW spoke with pt who states that she will not be returning to the home she has been living at with her daughter. Pt states that she will be going to stay with friend Gustav Domino, pt provided permission for CSW to speak with Ms. Price. CSW spoke to Ms. Price who states that pt will be coming to her home once D/C. She is requesting HH PT/Aide or OT with [facility]. CSW to reach out to [facility] rep to see if they can accept referral. Pt will be going to stay at [address] [place], [place] [address]. Ms. Domino will provide transportation and is able to get pts belongings from pts daughters home.   CSW spoke with pts daughter who states she is packing all of her pts belongings. Pt has home O2 supplied through [employer]. TOC to follow.   Expected Discharge Plan: Home w Home Health Services Barriers to Discharge: Continued Medical Work up   Patient Goals and CMS Choice Patient states their goals for this hospitalization and ongoing recovery are:: go to friend Audrey's home CMS Medicare.gov Compare Post Acute Care list provided to:: Patient Choice offered to / list presented to : Patient      Expected Discharge Plan and Services In-house Referral: Clinical Social Work Discharge Planning Services: CM Consult Post Acute Care Choice: Home Health Living arrangements for the past 2 months: Single Family Home                           HH Arranged: PT          Prior Living Arrangements/Services Living arrangements for the past 2 months: Single Family Home Lives with:: Friends Patient language and  need for interpreter reviewed:: Yes Do you feel safe going back to the place where you live?: Yes      Need for Family Participation in Patient Care: Yes (Comment) Care giver support system in place?: Yes (comment) Current home services: DME Criminal Activity/Legal Involvement Pertinent to Current Situation/Hospitalization: No - Comment as needed  Activities of Daily Living   ADL Screening (condition at time of admission) Independently performs ADLs?: Yes (appropriate for developmental age) Is the patient deaf or have difficulty hearing?: No Does the patient have difficulty seeing, even when wearing glasses/contacts?: No Does the patient have difficulty concentrating, remembering, or making decisions?: No  Permission Sought/Granted                  Emotional Assessment Appearance:: Appears stated age Attitude/Demeanor/Rapport: Engaged Affect (typically observed): Accepting Orientation: : Oriented to Self, Oriented to Place, Oriented to  Time, Oriented to Situation Alcohol / Substance Use: Not Applicable Psych Involvement: No (comment)  Admission diagnosis:  SOB (shortness of breath) [R06.02] Influenza A [J10.1] Influenza [J11.1] Acute respiratory failure with hypoxia and hypercapnia (HCC) [J96.01, J96.02] Acute hypercapnic respiratory failure (HCC) [J96.02] Patient Active Problem List   Diagnosis Date Noted   SOB (shortness of breath) 12/30/2023   Acute hypercapnic respiratory failure (HCC) 12/29/2023   Influenza  A 12/29/2023   Acute viral pharyngitis 12/27/2023   Recurrent sinusitis 07/01/2023   COPD, frequent exacerbations (HCC) 07/01/2023   CAD (coronary artery disease) 05/14/2023   Lung nodule 01/29/2023   Intractable vomiting with nausea 01/29/2023   Acute kidney injury superimposed on chronic kidney disease (HCC) 01/16/2023   Lactic acidosis 01/16/2023   Elevated troponin 01/16/2023   Hypoalbuminemia due to protein-calorie malnutrition (HCC) 01/16/2023   CKD  stage G5/A3, GFR <15 and albumin creatinine ratio >300 mg/g (HCC) 12/02/2022   Hyperglycemia due to diabetes mellitus (HCC) 12/02/2022   Iron deficiency 01/01/2022   COPD exacerbation (HCC) 10/09/2021   Nausea 05/28/2021   Abnormal chest CT 03/27/2021   Folic acid  deficiency 03/27/2021   Acute on chronic respiratory failure with hypoxia (HCC) 03/02/2021   Elevated d-dimer 03/02/2021   Macrocytic anemia 03/02/2021   Peripheral neuropathy 03/02/2021   OSA (obstructive sleep apnea) 03/02/2021   RLS (restless legs syndrome) 01/14/2019   Sinus tachycardia 04/08/2018   Hypernatremia 04/08/2018   Chronic ethmoidal sinusitis 01/29/2018   Stage 3b chronic kidney disease (CKD) (HCC) 10/26/2017   Obesity, Class III, BMI 40-49.9 (morbid obesity) (HCC) 10/26/2017   Cutaneous candidiasis 10/24/2017   Chronic back pain 10/24/2017   Chronic respiratory failure with hypoxia (HCC) 10/23/2017   Hypomagnesemia 10/23/2017   Glaucoma 10/23/2017   Chronic insomnia 01/30/2017   Pulmonary nodule, RML, needs noncontrast CT in February 2019 01/26/2017   Lumbar degenerative disc disease 08/14/2016   Combined forms of age-related cataract of 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   Posterior synechiae (iris), 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   Lower GI bleed 10/18/2014   Gastroesophageal reflux disease without esophagitis 10/12/2014   Diabetic peripheral neuropathy associated with type 2 diabetes mellitus (HCC) 10/12/2014   Leukocytosis 10/08/2014   History of anxiety 10/08/2014   Type 2 diabetes mellitus (HCC) 09/07/2014   Acute angle-closure glaucoma 10/03/2013   Bilateral carpal tunnel syndrome 03/14/2013   Bipolar disorder (HCC) 01/24/2013   Steroid-induced diabetes (HCC) 01/06/2013   Vitamin B 12 deficiency 06/24/2011   Allergic rhinitis 08/17/2009   Vitamin D deficiency 07/13/2009   Severe obesity (BMI >= 40) (HCC) 05/03/2009   Essential hypertension 03/26/2009   LEG EDEMA, BILATERAL 03/26/2009   Hypothyroidism 02/20/2009    Mixed hyperlipidemia 12/12/2008   Former smoker 12/12/2008   MDD (major depressive disorder), recurrent episode, severe (HCC) 12/12/2008   Mononeuritis 12/12/2008   Intrinsic asthma 12/12/2008   COPD (chronic obstructive pulmonary disease) with chronic bronchitis (HCC) 12/12/2008   Spinal stenosis of lumbar region 12/12/2008   PCP:  Alvan Dorothyann BIRCH, MD Pharmacy:   [phone] - [place], [place] - [address] [place] [address] Phone: [phone] Fax: [phone]  [phone] [place], [place] - [address] [place] [address] Phone: [phone] Fax: [phone]     Social Drivers of Health (SDOH) Social History: SDOH Screenings   Food Insecurity: No Food Insecurity (12/29/2023)  Housing: Low Risk  (12/29/2023)  Transportation Needs: No Transportation Needs (12/29/2023)  Utilities: Not At Risk (12/29/2023)  Alcohol Screen: Low Risk  (08/25/2023)  Depression (PHQ2-9): High Risk (12/22/2023)  Financial Resource Strain: Low Risk  (12/25/2023)  Physical Activity: Inactive (12/25/2023)  Social Connections: Socially Isolated (12/25/2023)  Stress: Stress Concern Present (12/25/2023)  Tobacco Use: Medium Risk (12/29/2023)  Health Literacy: Adequate Health Literacy (08/25/2023)   SDOH Interventions:     Readmission Risk Interventions    12/30/2023    3:00 PM 01/17/2023  3:21 PM 01/16/2023   10:07 AM  Readmission Risk Prevention Plan  Transportation Screening Complete Complete Complete  HRI or Home Care Consult Complete  Complete  Social Work Consult for Recovery Care Planning/Counseling Complete  Complete  Palliative Care Screening Not Applicable  Not Applicable  Medication Review Oceanographer) Complete  Complete

## 2023-12-30 NOTE — ED Notes (Signed)
 Pt was moved to a hospital bed to be comfortable while waiting for a bed upstairs. Pt was given a fan due to it being hot in the room and unable to keep door open with pt having the flu. Pt is not resting comfortably in the bed watching tv with call light in reach.

## 2023-12-30 NOTE — Progress Notes (Signed)
 RT was called to patient room for breathing treatment around 1208. Brovana  and duoneb given and patient was on 3L nasal cannula with sats of 93% at that time. RT called several times after for patient wanting a breathing treatment but per scheduled and prn treatments ordered it had not been enough time. RT explained this to patient. RT called again per patient sats dropping in the 70's. Upon entering room patient flow had been increased but sats still low. RT noticed nasal cannula was only looped on one ear and hanging off of the other one. RT asked patient had she removed O2 and she stated no but I am wondering if in fact that did happen and was the cause of the drop in sats. RT increased liter flow to 6L with improvement in sats. RT then decreased liter flow back down to 3L and she remained with at sat of 92%. MD was messaged by RN and new orders given for treatment schedule.

## 2023-12-30 NOTE — Evaluation (Signed)
 Physical Therapy Evaluation Patient Details Name: Krystal Burgess MRN: [id number] DOB: [birth year] Today's Date: 12/30/2023  History of Present Illness  Krystal Burgess is a 63 y.o. female with medical history significant for asthma/COPD on home 2-3 L nasal cannula oxygen , hypothyroidism, GERD, hypertension, dyslipidemia, morbid obesity who presented to the ED with worsening shortness of breath over the last 2-3 days.  She was recently discharged from [facility] a little over a week ago with acute on chronic hypoxemic respiratory failure secondary to COPD exacerbation and community-acquired pneumonia.  She supposedly had MRSA bacteremia and was started on daptomycin but it is uncertain how compliant she has been with this medication and states that she only received this medication whenever her daughter would give it to her.  She apparently has quite a bit of problems with her daughter at home and states that there are cats at home which aggravate her respiratory symptoms.  She denies any recent fevers or chills.   Clinical Impression  Patient demonstrates good return for sitting up at bedside and transferring to/from [facility] without loss of balance.  Patient limited for taking steps mostly due to c/o SOB with SpO2 dropping from 91% to 87% with exertion.  Patient tolerated sitting up on [facility] after therapy- nursing staff notified.  Patient will benefit from continued skilled physical therapy in hospital and recommended venue below to increase strength, balance, endurance for safe ADLs and gait.          If plan is discharge home, recommend the following: A little help with walking and/or transfers;A little help with bathing/dressing/bathroom;Help with stairs or ramp for entrance;Assistance with cooking/housework   Can travel by private vehicle        Equipment Recommendations None recommended by PT  Recommendations for Other Services       Functional Status Assessment Patient has had a recent decline in  their functional status and demonstrates the ability to make significant improvements in function in a reasonable and predictable amount of time.     Precautions / Restrictions Precautions Precautions: Fall Restrictions Weight Bearing Restrictions Per Provider Order: No      Mobility  Bed Mobility Overal bed mobility: Modified Independent                  Transfers Overall transfer level: Needs assistance Equipment used: Rolling walker (2 wheels) Transfers: Sit to/from Stand, Bed to chair/wheelchair/BSC Sit to Stand: Contact guard assist   Step pivot transfers: Contact guard assist       General transfer comment: labored movement without loss of balance    Ambulation/Gait Ambulation/Gait assistance: Min assist Gait Distance (Feet): 3 Feet Assistive device: Rolling walker (2 wheels) Gait Pattern/deviations: Decreased step length - right, Decreased step length - left, Decreased stride length Gait velocity: slow     General Gait Details: limited to a few steps at bedside mostly due to c/o SOB with SpO2 dropping from 92% to 87% with exertion  Stairs            Wheelchair Mobility     Tilt Bed    Modified Rankin (Stroke Patients Only)       Balance Overall balance assessment: Needs assistance Sitting-balance support: Feet supported, No upper extremity supported Sitting balance-Leahy Scale: Good Sitting balance - Comments: seated at EOB   Standing balance support: Reliant on assistive device for balance, During functional activity, Bilateral upper extremity supported Standing balance-Leahy Scale: Fair Standing balance comment: fair/good using RW  Pertinent Vitals/Pain Pain Assessment Pain Assessment: No/denies pain    Home Living Family/patient expects to be discharged to:: Private residence Living Arrangements: Children Available Help at Discharge: Family;Available 24 hours/day Type of Home: House Home  Access: Stairs to enter Entrance Stairs-Rails: Doctor, General Practice of Steps: 4 steps to to enter home; bilateral railing but can only reach one at a time; 2 steps to access her living area of the home without railings   Home Layout: One level Home Equipment: Shower seat - built in;Rollator (4 wheels);BSC/3in1;Grab bars - tub/shower;Hand held shower head      Prior Function Prior Level of Function : Needs assist       Physical Assist : Mobility (physical) Mobility (physical): Gait;Stairs;Transfers;Bed mobility   Mobility Comments: Household ambulator using RW, on 2-3 LPM home O2 ADLs Comments: assisted by family     Extremity/Trunk Assessment   Upper Extremity Assessment Upper Extremity Assessment: Overall WFL for tasks assessed    Lower Extremity Assessment Lower Extremity Assessment: Generalized weakness    Cervical / Trunk Assessment Cervical / Trunk Assessment: Kyphotic  Communication   Communication Communication: No apparent difficulties  Cognition Arousal: Alert Behavior During Therapy: WFL for tasks assessed/performed Overall Cognitive Status: Within Functional Limits for tasks assessed                                          General Comments      Exercises     Assessment/Plan    PT Assessment Patient needs continued PT services  PT Problem List Decreased strength;Decreased activity tolerance;Decreased balance;Decreased mobility       PT Treatment Interventions DME instruction;Gait training;Stair training;Functional mobility training;Therapeutic activities;Therapeutic exercise;Patient/family education    PT Goals (Current goals can be found in the Care Plan section)  Acute Rehab PT Goals Patient Stated Goal: Move in with friend who will assist PT Goal Formulation: With patient Time For Goal Achievement: 01/06/24 Potential to Achieve Goals: Good    Frequency Min 3X/week     Co-evaluation                AM-PAC PT 6 Clicks Mobility  Outcome Measure Help needed turning from your back to your side while in a flat bed without using bedrails?: None Help needed moving from lying on your back to sitting on the side of a flat bed without using bedrails?: None Help needed moving to and from a bed to a chair (including a wheelchair)?: A Little Help needed standing up from a chair using your arms (e.g., wheelchair or bedside chair)?: A Little Help needed to walk in hospital room?: A Lot Help needed climbing 3-5 steps with a railing? : A Lot 6 Click Score: 18    End of Session Equipment Utilized During Treatment: Oxygen  Activity Tolerance: Patient tolerated treatment well;Patient limited by fatigue Patient left: with call bell/phone within reach;Other (comment) (left sitting on [facility]) Nurse Communication: Mobility status PT Visit Diagnosis: Unsteadiness on feet (R26.81);Other abnormalities of gait and mobility (R26.89);Muscle weakness (generalized) (M62.81)    Time: 1030-1103 PT Time Calculation (min) (ACUTE ONLY): 33 min   Charges:   PT Evaluation $PT Eval Moderate Complexity: 1 Mod PT Treatments $Therapeutic Activity: 23-37 mins PT General Charges $$ ACUTE PT VISIT: 1 Visit         2:00 PM, 12/30/23 Lynwood Music, MPT Physical Therapist with [facility] [phone] office [phone]  mobile phone

## 2023-12-30 NOTE — ED Notes (Signed)
 Pt continues to call out repeatedly and reports she was promised several things by night shift including having a breathing treatment at exactly 0700, see the MD, and speaking to Social Work.  Primary RN and this Consulting Civil Engineer are unaware of any of these promises.  Pt reassured we have called RT to provide her treatment and called the MD to come and speak to her, but we have no control over when they come.  Also, Pt is reporting that she wants to leave AMA, because we are withholding care.  Pt, again, reminded that we have called RT and the MD and no care is being withheld.    Pt asking for us  to set her up a ride so she can leave AMA.  Pt informed we do not provide rides for AMA Pts.  Pt reassured we would be happy to wheel her out, if she is able to set-up a ride.   Pt continues to be upset.

## 2023-12-31 ENCOUNTER — Encounter: Payer: Self-pay | Admitting: Family Medicine

## 2023-12-31 DIAGNOSIS — J9602 Acute respiratory failure with hypercapnia: Secondary | ICD-10-CM | POA: Diagnosis not present

## 2023-12-31 LAB — CBC
HCT: 39 % (ref 36.0–46.0)
Hemoglobin: 11.3 g/dL — ABNORMAL LOW (ref 12.0–15.0)
MCH: 27.5 pg (ref 26.0–34.0)
MCHC: 29 g/dL — ABNORMAL LOW (ref 30.0–36.0)
MCV: 94.9 fL (ref 80.0–100.0)
Platelets: 208 10*3/uL (ref 150–400)
RBC: 4.11 MIL/uL (ref 3.87–5.11)
RDW: 16.3 % — ABNORMAL HIGH (ref 11.5–15.5)
WBC: 6.5 10*3/uL (ref 4.0–10.5)
nRBC: 0 % (ref 0.0–0.2)

## 2023-12-31 LAB — BASIC METABOLIC PANEL
Anion gap: 9 (ref 5–15)
BUN: 31 mg/dL — ABNORMAL HIGH (ref 6–20)
CO2: 41 mmol/L — ABNORMAL HIGH (ref 22–32)
Calcium: 9.5 mg/dL (ref 8.9–10.3)
Chloride: 88 mmol/L — ABNORMAL LOW (ref 98–111)
Creatinine, Ser: 1.37 mg/dL — ABNORMAL HIGH (ref 0.44–1.00)
GFR, Estimated: 44 mL/min — ABNORMAL LOW (ref >60)
Glucose, Bld: 115 mg/dL — ABNORMAL HIGH (ref 70–99)
Potassium: 4.6 mmol/L (ref 3.5–5.1)
Sodium: 138 mmol/L (ref 135–145)

## 2023-12-31 MED ORDER — ALPRAZOLAM 1 MG PO TABS: 1.0000 mg | ORAL_TABLET | Freq: Three times a day (TID) | ORAL | 0 refills | Status: DC | PRN

## 2023-12-31 MED ORDER — NYSTATIN 100000 UNIT/GM EX POWD
Freq: Two times a day (BID) | CUTANEOUS | Status: DC
  Filled 2023-12-31 (×2): qty 15

## 2023-12-31 MED ORDER — VILAZODONE HCL 20 MG PO TABS: 20.0000 mg | ORAL_TABLET | Freq: Every day | ORAL | 0 refills | Status: DC

## 2023-12-31 MED ORDER — PREDNISONE 10 MG PO TABS: 40.0000 mg | ORAL_TABLET | Freq: Every day | ORAL | 0 refills | Status: AC

## 2023-12-31 MED ORDER — OSELTAMIVIR PHOSPHATE 75 MG PO CAPS: 75.0000 mg | ORAL_CAPSULE | Freq: Two times a day (BID) | ORAL | 0 refills | Status: AC

## 2023-12-31 NOTE — Discharge Summary (Signed)
 Physician Discharge Summary  Krystal Burgess FMW:[id number] DOB: 19-Apr-1961 DOA: 12/29/2023  PCP: Alvan Dorothyann BIRCH, MD  Admit date: 12/29/2023  Discharge date: 12/31/2023  Admitted From:Home  Disposition:  Home  Recommendations for Outpatient Follow-up:  Follow up with PCP in 1-2 weeks Continue on Tamiflu  as prescribed to complete course of treatment for influenza Continue prednisone  taper as ordered Refill given on Xanax  and Viibryd , needs to follow-up with PCP regarding further management Continue other home medications as prior Home Health: Yes with PT, OT, and aide  Equipment/Devices: Has home nasal cannula oxygen   Discharge Condition:Stable  CODE STATUS: Full  Diet recommendation: Heart Healthy  Brief/Interim Summary: Krystal Burgess is a 63 y.o. female with medical history significant for asthma/COPD on home 2-3 L nasal cannula oxygen , hypothyroidism, GERD, hypertension, dyslipidemia, morbid obesity who presented to the ED with worsening shortness of breath over the last 2-3 days.  She was recently discharged from [facility] a little over a week ago with acute on chronic hypoxemic respiratory failure secondary to COPD exacerbation and community-acquired pneumonia.  She supposedly had MRSA bacteremia and was started on daptomycin but it is uncertain how compliant she has been with this medication and states that she only received this medication whenever her daughter would give it to her.   Patient was admitted with dyspnea secondary to influenza infection with mild COPD exacerbation as well as some transaminitis which has now improved.  She is in stable condition for discharge and will continue her Tamiflu  outpatient as well as other medications as noted below.  She will move in with her friend in [place] and have further follow-up care there with home health services.  No other acute events or concerns noted throughout the course of this admission.  PICC line has been removed as  there was no further need for IV antibiotics at this point.  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  Discharge Instructions  Discharge Instructions     Diet - low sodium heart healthy   Complete by: As directed    Increase activity slowly   Complete by: As directed       Allergies as of 12/31/2023       Reactions   Risperidone  And Related Shortness Of Breath   Aripiprazole Other (See Comments)   REACTION: muscle jerks   Fluoxetine Hcl Other (See Comments)   REACTION: Intolerant   Nsaids Other (See Comments)   Upper GI bleed CKD 3    Paroxetine Other (See Comments)   REACTION: Intolerance   Ziprasidone  Hcl Other (See Comments)   shakes   Chantix  [varenicline  Tartrate] Other (See Comments)   messing with my mood   Metformin And Related Nausea Only   Montelukast  Other (See Comments)   it kept me sick with an upper respiratory infection   Onglyza [saxagliptin ] Nausea Only   Augmentin  [amoxicillin -pot Clavulanate] Other (See Comments)   Gets yeast infection every  time   Fluoxetine Hcl    REACTION: Intolerant        Medication List     TAKE these medications    albuterol  108 (90 Base) MCG/ACT inhaler Commonly known as: VENTOLIN  HFA INHALE 2 PUFFS INTO THE LUNGS EVERY 4 HOURS AS NEEDED FOR WHEEZING OR SHORTNESS OF BREATH.   ALPRAZolam  1 MG  tablet Commonly known as: XANAX  Take 1 tablet (1 mg total) by mouth 3 (three) times daily as needed for anxiety.   AMBULATORY NON FORMULARY MEDICATION Battery powered portable oxygen  concentrator   AMBULATORY NON FORMULARY MEDICATION Battery powered portable nebulizer   amLODipine  5 MG tablet Commonly known as: NORVASC  Take 5 mg by mouth daily.   azelastine  0.1 % nasal spray Commonly known as: ASTELIN  Place 2 sprays into both nostrils 2 (two) times daily. Use in each nostril as directed   Blood Glucose Monitoring Suppl Devi 1 each by Does not apply route daily as needed. May substitute to any manufacturer covered by patient's insurance.   buprenorphine 15 MCG/HR Commonly known as: BUTRANS Place 1 patch onto the skin once a week.   DAPTOmycin 500 MG injection Commonly known as: CUBICIN Inject into the vein daily.   fluticasone  50 MCG/ACT nasal spray Commonly known as: FLONASE  SPRAY 2 SPRAYS INTO EACH NOSTRIL EVERY DAY   folic acid  1 MG tablet Commonly known as: FOLVITE  TAKE 1 TABLET BY MOUTH EVERY DAY   furosemide  20 MG tablet Commonly known as: LASIX  Take 1 tablet (20 mg total) by mouth daily as needed.   gabapentin  600 MG tablet Commonly known as: NEURONTIN  TAKE 2 TABLETS (1,200 MG TOTAL) BY MOUTH AT BEDTIME. PATIENT TAKES 1200 MG DAILY   ipratropium 0.06 % nasal spray Commonly known as: ATROVENT  Place 2 sprays into both nostrils 3 (three) times daily.   ipratropium-albuterol  0.5-2.5 (3) MG/3ML Soln Commonly known as: DUONEB INHALE 3 MLS INTO THE LUNGS EVERY 2 (TWO) HOURS AS NEEDED (WHEEZING OR SHORTNESS OF BREATH).   levothyroxine  88 MCG tablet Commonly known as: SYNTHROID  TAKE 1 TABLET BY MOUTH EVERY DAY   losartan  100 MG tablet Commonly known as: COZAAR  TAKE 1 TABLET BY MOUTH EVERY DAY   ondansetron  8 MG tablet Commonly known as: ZOFRAN  Take 1 tablet (8 mg total) by mouth every 8 (eight) hours as needed for nausea or vomiting.   oseltamivir  75 MG  capsule Commonly known as: TAMIFLU  Take 1 capsule (75 mg total) by mouth 2 (two) times daily for 2 days.   pantoprazole  40 MG tablet Commonly known as: PROTONIX  TAKE 1 TABLET BY MOUTH EVERY DAY   predniSONE  5 MG tablet Commonly known as: DELTASONE  TAKE 1/2 TABLET (2.5 MG TOTAL) BY MOUTH DAILY WITH BREAKFAST. What changed: Another medication with the same name was changed. Make sure you understand how and when to take each.   predniSONE  10 MG tablet Commonly known as: DELTASONE  Take 4 tablets (40 mg total) by mouth daily for 5 days. What changed:  medication strength See the new instructions.   rosuvastatin  20 MG tablet Commonly known as: CRESTOR  Take 1 tablet (20 mg total) by mouth at bedtime.   theophylline  300 MG 12 hr tablet Commonly known as: THEODUR TAKE 1 TABLET BY MOUTH TWICE A DAY   tiZANidine  4 MG tablet Commonly known as: ZANAFLEX  Take 1 tablet (4 mg total) by mouth 2 (two) times daily as needed for muscle spasms.   traZODone  50 MG tablet Commonly known as: DESYREL  Take 50 mg by mouth at bedtime  as needed for sleep.   Trelegy Ellipta  100-62.5-25 MCG/ACT Aepb Generic drug: Fluticasone -Umeclidin-Vilant INHALE 1 PUFF BY MOUTH EVERY DAY   Vilazodone  HCl 20 MG Tabs Take 1 tablet (20 mg total) by mouth daily.        Follow-up Information     Alvan Dorothyann BIRCH, MD. Schedule an appointment as soon as possible for a visit in 1 week(s).   Specialty: Family Medicine Contact information: [address] [place] [address] [phone]                Allergies  Allergen Reactions   Risperidone  And Related Shortness Of Breath   Aripiprazole Other (See Comments)    REACTION: muscle jerks    Fluoxetine Hcl Other (See Comments)    REACTION: Intolerant   Nsaids Other (See Comments)    Upper GI bleed CKD 3    Paroxetine Other (See Comments)    REACTION: Intolerance    Ziprasidone  Hcl Other (See Comments)    shakes     Chantix  [Varenicline  Tartrate] Other (See Comments)    messing with my mood   Metformin And Related Nausea Only   Montelukast  Other (See Comments)    it kept me sick with an upper respiratory infection   Onglyza [Saxagliptin ] Nausea Only   Augmentin  [Amoxicillin -Pot Clavulanate] Other (See Comments)    Gets yeast infection every time    Fluoxetine Hcl     REACTION: Intolerant    Consultations: None   Procedures/Studies: DG Chest Port 1 View Result Date: 12/29/2023 CLINICAL DATA:  Shortness of breath.  PICC line placement. EXAM: PORTABLE CHEST 1 VIEW COMPARISON:  Chest radiograph dated 12/10/2023. FINDINGS: Left-sided PICC with tip over the central SVC close to the cavoatrial junction. Bibasilar opacities again noted. No pleural effusion or pneumothorax. The cardiac silhouette is within normal limits. No acute osseous pathology. IMPRESSION: 1. Left-sided PICC with tip at the cavoatrial junction. 2. Bibasilar infiltrates. Electronically Signed   By: Vanetta Chou M.D.   On: 12/29/2023 09:14     Discharge Exam: Vitals:   12/31/23 0803 12/31/23 0804  BP:    Pulse:    Resp:    Temp:    SpO2: (!) 88% (!) 88%   Vitals:   12/31/23 0254 12/31/23 0357 12/31/23 0803 12/31/23 0804  BP:  (!) 159/84    Pulse:  99    Resp:  20    Temp:  98.2 F (36.8 C)    TempSrc:  Oral    SpO2: 93% 93% (!) 88% (!) 88%  Weight:      Height:        General: Pt is alert, awake, not in acute distress, morbidly obese Cardiovascular: RRR, S1/S2 +, no rubs, no gallops Respiratory: CTA bilaterally, no wheezing, no rhonchi Abdominal: Soft, NT, ND, bowel sounds + Extremities: no edema, no cyanosis    The results of significant diagnostics from this hospitalization (including imaging, microbiology, ancillary and laboratory) are listed below for reference.     Microbiology: Recent Results (from the past 240 hours)  Resp panel by RT-PCR (RSV, Flu A&B, Covid) Anterior Nasal Swab     Status:  Abnormal   Collection Time: 12/29/23  8:58 AM   Specimen: Anterior Nasal Swab  Result Value Ref Range Status   SARS Coronavirus 2 by RT PCR NEGATIVE NEGATIVE Final    Comment: (NOTE) SARS-CoV-2 target nucleic acids are NOT DETECTED.  The SARS-CoV-2 RNA is generally detectable in upper respiratory specimens during the  acute phase of infection. The lowest concentration of SARS-CoV-2 viral copies this assay can detect is 138 copies/mL. A negative result does not preclude SARS-Cov-2 infection and should not be used as the sole basis for treatment or other patient management decisions. A negative result may occur with  improper specimen collection/handling, submission of specimen other than nasopharyngeal swab, presence of viral mutation(s) within the areas targeted by this assay, and inadequate number of viral copies(<138 copies/mL). A negative result must be combined with clinical observations, patient history, and epidemiological information. The expected result is Negative.  Fact Sheet for Patients:  bloggercourse.com  Fact Sheet for Healthcare Providers:  seriousbroker.it  This test is no t yet approved or cleared by the [place]  FDA and  has been authorized for detection and/or diagnosis of SARS-CoV-2 by FDA under an Emergency Use Authorization (EUA). This EUA will remain  in effect (meaning this test can be used) for the duration of the COVID-19 declaration under Section 564(b)(1) of the Act, 21 U.S.C.section 360bbb-3(b)(1), unless the authorization is terminated  or revoked sooner.       Influenza A by PCR POSITIVE (A) NEGATIVE Final   Influenza B by PCR NEGATIVE NEGATIVE Final    Comment: (NOTE) The Xpert Xpress SARS-CoV-2/FLU/RSV plus assay is intended as an aid in the diagnosis of influenza from Nasopharyngeal swab specimens and should not be used as a sole basis for treatment. Nasal washings and aspirates are  unacceptable for Xpert Xpress SARS-CoV-2/FLU/RSV testing.  Fact Sheet for Patients: bloggercourse.com  Fact Sheet for Healthcare Providers: seriousbroker.it  This test is not yet approved or cleared by the [place]  FDA and has been authorized for detection and/or diagnosis of SARS-CoV-2 by FDA under an Emergency Use Authorization (EUA). This EUA will remain in effect (meaning this test can be used) for the duration of the COVID-19 declaration under Section 564(b)(1) of the Act, 21 U.S.C. section 360bbb-3(b)(1), unless the authorization is terminated or revoked.     Resp Syncytial Virus by PCR NEGATIVE NEGATIVE Final    Comment: (NOTE) Fact Sheet for Patients: bloggercourse.com  Fact Sheet for Healthcare Providers: seriousbroker.it  This test is not yet approved or cleared by the [place]  FDA and has been authorized for detection and/or diagnosis of SARS-CoV-2 by FDA under an Emergency Use Authorization (EUA). This EUA will remain in effect (meaning this test can be used) for the duration of the COVID-19 declaration under Section 564(b)(1) of the Act, 21 U.S.C. section 360bbb-3(b)(1), unless the authorization is terminated or revoked.  Performed at [facility], [address] [place], [place] [address]   Culture, blood (Routine X 2) w Reflex to ID Panel     Status: None (Preliminary result)   Collection Time: 12/29/23 12:28 PM   Specimen: BLOOD  Result Value Ref Range Status   Specimen Description BLOOD PICC  Final   Special Requests   Final    BOTTLES DRAWN AEROBIC AND ANAEROBIC Blood Culture adequate volume   Culture   Final    NO GROWTH 2 DAYS Performed at [facility], [address], [place], [place] [address]    Report Status PENDING  Incomplete  Culture, blood (Routine X 2) w Reflex to ID Panel     Status: None (Preliminary result)   Collection Time:  12/29/23 12:28 PM   Specimen: BLOOD  Result Value Ref Range Status   Specimen Description BLOOD BLOOD RIGHT HAND AEROBIC BOTTLE ONLY  Final   Special Requests   Final  BOTTLES DRAWN AEROBIC ONLY Blood Culture adequate volume   Culture   Final    NO GROWTH 2 DAYS Performed at [facility], [address], [place], [place] [address]    Report Status PENDING  Incomplete     Labs: BNP (last 3 results) Recent Labs    01/15/23 2030  BNP 70.0   Basic Metabolic Panel: Recent Labs  Lab 12/29/23 0920 12/30/23 0451 12/31/23 0425  NA 138 138 138  K 4.0 4.2 4.6  CL 90* 89* 88*  CO2 38* 39* 41*  GLUCOSE 183* 88 115*  BUN 15 24* 31*  CREATININE 1.21* 1.36* 1.37*  CALCIUM  9.5 9.5 9.5  MG  --  1.9  --    Liver Function Tests: Recent Labs  Lab 12/29/23 0920 12/30/23 0451  AST 244* 120*  ALT 134* 101*  ALKPHOS 79 66  BILITOT 0.2 0.5  PROT 7.9 7.3  ALBUMIN 3.5 3.2*   No results for input(s): LIPASE, AMYLASE in the last 168 hours. No results for input(s): AMMONIA in the last 168 hours. CBC: Recent Labs  Lab 12/29/23 0920 12/30/23 0451 12/31/23 0425  WBC 10.6* 7.1 6.5  HGB 11.3* 10.8* 11.3*  HCT 39.0 37.1 39.0  MCV 97.5 96.1 94.9  PLT 281 238 208   Cardiac Enzymes: No results for input(s): CKTOTAL, CKMB, CKMBINDEX, TROPONINI in the last 168 hours. BNP: Invalid input(s): POCBNP CBG: No results for input(s): GLUCAP in the last 168 hours. D-Dimer No results for input(s): DDIMER in the last 72 hours. Hgb A1c No results for input(s): HGBA1C in the last 72 hours. Lipid Profile No results for input(s): CHOL, HDL, LDLCALC, TRIG, CHOLHDL, LDLDIRECT in the last 72 hours. Thyroid  function studies No results for input(s): TSH, T4TOTAL, T3FREE, THYROIDAB in the last 72 hours.  Invalid input(s): FREET3 Anemia work up No results for input(s): VITAMINB12, FOLATE, FERRITIN, TIBC, IRON, RETICCTPCT in the last 72  hours. Urinalysis    Component Value Date/Time   COLORURINE YELLOW 01/29/2023 2201   APPEARANCEUR CLEAR 01/29/2023 2201   LABSPEC 1.008 01/29/2023 2201   PHURINE 6.0 01/29/2023 2201   GLUCOSEU NEGATIVE 01/29/2023 2201   HGBUR SMALL (A) 01/29/2023 2201   HGBUR negative 05/03/2009 1012   BILIRUBINUR NEGATIVE 01/29/2023 2201   BILIRUBINUR neg 01/15/2015 1433   KETONESUR NEGATIVE 01/29/2023 2201   PROTEINUR NEGATIVE 01/29/2023 2201   UROBILINOGEN 0.2 01/15/2015 1433   UROBILINOGEN 0.2 05/03/2009 1012   NITRITE NEGATIVE 01/29/2023 2201   LEUKOCYTESUR NEGATIVE 01/29/2023 2201   Sepsis Labs Recent Labs  Lab 12/29/23 0920 12/30/23 0451 12/31/23 0425  WBC 10.6* 7.1 6.5   Microbiology Recent Results (from the past 240 hours)  Resp panel by RT-PCR (RSV, Flu A&B, Covid) Anterior Nasal Swab     Status: Abnormal   Collection Time: 12/29/23  8:58 AM   Specimen: Anterior Nasal Swab  Result Value Ref Range Status   SARS Coronavirus 2 by RT PCR NEGATIVE NEGATIVE Final    Comment: (NOTE) SARS-CoV-2 target nucleic acids are NOT DETECTED.  The SARS-CoV-2 RNA is generally detectable in upper respiratory specimens during the acute phase of infection. The lowest concentration of SARS-CoV-2 viral copies this assay can detect is 138 copies/mL. A negative result does not preclude SARS-Cov-2 infection and should not be used as the sole basis for treatment or other patient management decisions. A negative result may occur with  improper specimen collection/handling, submission of specimen other than nasopharyngeal swab, presence of viral mutation(s) within the areas targeted by this  assay, and inadequate number of viral copies(<138 copies/mL). A negative result must be combined with clinical observations, patient history, and epidemiological information. The expected result is Negative.  Fact Sheet for Patients:  bloggercourse.com  Fact Sheet for Healthcare Providers:   seriousbroker.it  This test is no t yet approved or cleared by the [place]  FDA and  has been authorized for detection and/or diagnosis of SARS-CoV-2 by FDA under an Emergency Use Authorization (EUA). This EUA will remain  in effect (meaning this test can be used) for the duration of the COVID-19 declaration under Section 564(b)(1) of the Act, 21 U.S.C.section 360bbb-3(b)(1), unless the authorization is terminated  or revoked sooner.       Influenza A by PCR POSITIVE (A) NEGATIVE Final   Influenza B by PCR NEGATIVE NEGATIVE Final    Comment: (NOTE) The Xpert Xpress SARS-CoV-2/FLU/RSV plus assay is intended as an aid in the diagnosis of influenza from Nasopharyngeal swab specimens and should not be used as a sole basis for treatment. Nasal washings and aspirates are unacceptable for Xpert Xpress SARS-CoV-2/FLU/RSV testing.  Fact Sheet for Patients: bloggercourse.com  Fact Sheet for Healthcare Providers: seriousbroker.it  This test is not yet approved or cleared by the [place]  FDA and has been authorized for detection and/or diagnosis of SARS-CoV-2 by FDA under an Emergency Use Authorization (EUA). This EUA will remain in effect (meaning this test can be used) for the duration of the COVID-19 declaration under Section 564(b)(1) of the Act, 21 U.S.C. section 360bbb-3(b)(1), unless the authorization is terminated or revoked.     Resp Syncytial Virus by PCR NEGATIVE NEGATIVE Final    Comment: (NOTE) Fact Sheet for Patients: bloggercourse.com  Fact Sheet for Healthcare Providers: seriousbroker.it  This test is not yet approved or cleared by the [place]  FDA and has been authorized for detection and/or diagnosis of SARS-CoV-2 by FDA under an Emergency Use Authorization (EUA). This EUA will remain in effect (meaning this test can be used)  for the duration of the COVID-19 declaration under Section 564(b)(1) of the Act, 21 U.S.C. section 360bbb-3(b)(1), unless the authorization is terminated or revoked.  Performed at [facility], [address], [place], [place] [address]   Culture, blood (Routine X 2) w Reflex to ID Panel     Status: None (Preliminary result)   Collection Time: 12/29/23 12:28 PM   Specimen: BLOOD  Result Value Ref Range Status   Specimen Description BLOOD PICC  Final   Special Requests   Final    BOTTLES DRAWN AEROBIC AND ANAEROBIC Blood Culture adequate volume   Culture   Final    NO GROWTH 2 DAYS Performed at [facility], [address], [place], [place] [address]    Report Status PENDING  Incomplete  Culture, blood (Routine X 2) w Reflex to ID Panel     Status: None (Preliminary result)   Collection Time: 12/29/23 12:28 PM   Specimen: BLOOD  Result Value Ref Range Status   Specimen Description BLOOD BLOOD RIGHT HAND AEROBIC BOTTLE ONLY  Final   Special Requests   Final    BOTTLES DRAWN AEROBIC ONLY Blood Culture adequate volume   Culture   Final    NO GROWTH 2 DAYS Performed at [facility], [address], [place], [place] [address]    Report Status PENDING  Incomplete     Time coordinating discharge: 35 minutes  SIGNED:   Adron JONETTA Fairly, DO [facility] 12/31/2023, 10:46 AM  If 7PM-7AM, please contact night-coverage [email or web address]

## 2023-12-31 NOTE — Progress Notes (Signed)
 PICC removed with length of 45 cm.  No bleeding at site and gauze and tegaderm placed.  EMS [facility].

## 2023-12-31 NOTE — Plan of Care (Signed)
 Patient anxious at the beginning of the shift. Requested prayer. Writer prayed with patient. Oxygen  sats WNL while on 3l oxygen .  Rested well during the night.  Problem: Education: Goal: Knowledge of General Education information will improve Description: Including pain rating scale, medication(s)/side effects and non-pharmacologic comfort measures Outcome: Progressing   Problem: Health Behavior/Discharge Planning: Goal: Ability to manage health-related needs will improve Outcome: Progressing   Problem: Clinical Measurements: Goal: Ability to maintain clinical measurements within normal limits will improve Outcome: Progressing Goal: Will remain free from infection Outcome: Progressing Goal: Diagnostic test results will improve Outcome: Progressing Goal: Respiratory complications will improve Outcome: Progressing Goal: Cardiovascular complication will be avoided Outcome: Progressing   Problem: Activity: Goal: Risk for activity intolerance will decrease Outcome: Progressing   Problem: Nutrition: Goal: Adequate nutrition will be maintained Outcome: Progressing   Problem: Coping: Goal: Level of anxiety will decrease Outcome: Progressing   Problem: Elimination: Goal: Will not experience complications related to bowel motility Outcome: Progressing Goal: Will not experience complications related to urinary retention Outcome: Progressing   Problem: Pain Management: Goal: General experience of comfort will improve Outcome: Progressing   Problem: Safety: Goal: Ability to remain free from injury will improve Outcome: Progressing   Problem: Skin Integrity: Goal: Risk for impaired skin integrity will decrease Outcome: Progressing   Problem: Education: Goal: Ability to state activities that reduce stress will improve Outcome: Progressing   Problem: Coping: Goal: Ability to identify and develop effective coping behavior will improve Outcome: Progressing   Problem:  Self-Concept: Goal: Ability to identify factors that promote anxiety will improve Outcome: Progressing Goal: Level of anxiety will decrease Outcome: Progressing Goal: Ability to modify response to factors that promote anxiety will improve Outcome: Progressing

## 2023-12-31 NOTE — TOC Transition Note (Signed)
 Transition of Care [facility]) - Discharge Note   Patient Details  Name: Krystal Burgess MRN: [id number] Date of Birth: Sep 28, 1961  Transition of Care [facility]) CM/SW Contact:  Lucie Lunger, LCSWA Phone Number: 12/31/2023, 11:54 AM   Clinical Narrative:    CSW updated that pt is medically stable for D/C today. CSW spoke to Artavia with [facility] who states that they can accept pt for [facility] PT/OT/Aide, MD placed HH orders. CSW spoke with pts friend Gustav who states she is coming to town to get pts belongings from her daughters home. Gustav requested that EMS pick pt up after 4pm, CSW set EMS up for pickup after 4pm today. CSW spoke to [place] with [facility] who states they are also following, she has been updated on plan. TOC to follow.   Final next level of care: Home w Home Health Services Barriers to Discharge: Barriers Resolved   Patient Goals and CMS Choice Patient states their goals for this hospitalization and ongoing recovery are:: go to friend Audrey's home CMS Medicare.gov Compare Post Acute Care list provided to:: Patient Choice offered to / list presented to : Patient      Discharge Placement                       Discharge Plan and Services Additional resources added to the After Visit Summary for   In-house Referral: Clinical Social Work Discharge Planning Services: CM Consult Post Acute Care Choice: Home Health                    HH Arranged: PT, OT, Nurse's Aide HH Agency: [facility] Date HH Agency Contacted: 12/31/23   Representative spoke with at [facility] Agency: [place]  Social Drivers of Health (SDOH) Interventions SDOH Screenings   Food Insecurity: No Food Insecurity (12/29/2023)  Housing: Low Risk  (12/29/2023)  Transportation Needs: No Transportation Needs (12/29/2023)  Utilities: Not At Risk (12/29/2023)  Alcohol Screen: Low Risk  (08/25/2023)  Depression (PHQ2-9): High Risk (12/22/2023)  Financial Resource Strain: Low Risk  (12/25/2023)  Physical  Activity: Inactive (12/25/2023)  Social Connections: Socially Isolated (12/25/2023)  Stress: Stress Concern Present (12/25/2023)  Tobacco Use: Medium Risk (12/29/2023)  Health Literacy: Adequate Health Literacy (08/25/2023)     Readmission Risk Interventions    12/30/2023    3:00 PM 01/17/2023    3:21 PM 01/16/2023   10:07 AM  Readmission Risk Prevention Plan  Transportation Screening Complete Complete Complete  HRI or Home Care Consult Complete  Complete  Social Work Consult for Recovery Care Planning/Counseling Complete  Complete  Palliative Care Screening Not Applicable  Not Applicable  Medication Review Oceanographer) Complete  Complete

## 2024-01-01 NOTE — Telephone Encounter (Signed)
 K to use any acute slot towards the end of next week for a hospital follow-up.  Okay to schedule virtual as well.

## 2024-01-01 NOTE — Telephone Encounter (Signed)
 Separate note already open in regard to oxygen so closing this 1 since her appointment has been made.

## 2024-01-01 NOTE — Telephone Encounter (Signed)
 Can we get set up locally for her?  Not sure they will accept if actively has an active contract with another co but we can call and ask.

## 2024-01-01 NOTE — Telephone Encounter (Signed)
 Copied from CRM [id number]. Topic: Appointments - Scheduling Inquiry for Clinic >> Jan 01, 2024  8:19 AM Powell HERO wrote: Patient needs to be seen before next Friday, she said she needs a hospital follow up but I do not see any available until February. She would like to do a virtual visit she is not able to get around. Krystal Burgess discharged 12/31/2023. Phone where she can be reached: [phone] Cell / [phone] Home Phone. Please do not call the numbers on file for patient, she states they are not working at this time.

## 2024-01-02 DIAGNOSIS — G9341 Metabolic encephalopathy: Secondary | ICD-10-CM | POA: Diagnosis not present

## 2024-01-02 DIAGNOSIS — J9621 Acute and chronic respiratory failure with hypoxia: Secondary | ICD-10-CM | POA: Diagnosis not present

## 2024-01-02 DIAGNOSIS — R6521 Severe sepsis with septic shock: Secondary | ICD-10-CM | POA: Diagnosis not present

## 2024-01-02 DIAGNOSIS — Z452 Encounter for adjustment and management of vascular access device: Secondary | ICD-10-CM | POA: Diagnosis not present

## 2024-01-02 DIAGNOSIS — I499 Cardiac arrhythmia, unspecified: Secondary | ICD-10-CM | POA: Diagnosis not present

## 2024-01-02 DIAGNOSIS — E039 Hypothyroidism, unspecified: Secondary | ICD-10-CM | POA: Diagnosis not present

## 2024-01-02 DIAGNOSIS — M549 Dorsalgia, unspecified: Secondary | ICD-10-CM | POA: Diagnosis not present

## 2024-01-02 DIAGNOSIS — Z515 Encounter for palliative care: Secondary | ICD-10-CM | POA: Diagnosis not present

## 2024-01-02 DIAGNOSIS — G8929 Other chronic pain: Secondary | ICD-10-CM | POA: Diagnosis not present

## 2024-01-02 DIAGNOSIS — E1122 Type 2 diabetes mellitus with diabetic chronic kidney disease: Secondary | ICD-10-CM | POA: Diagnosis not present

## 2024-01-02 DIAGNOSIS — J441 Chronic obstructive pulmonary disease with (acute) exacerbation: Secondary | ICD-10-CM | POA: Diagnosis not present

## 2024-01-02 DIAGNOSIS — R531 Weakness: Secondary | ICD-10-CM | POA: Diagnosis not present

## 2024-01-02 DIAGNOSIS — J440 Chronic obstructive pulmonary disease with acute lower respiratory infection: Secondary | ICD-10-CM | POA: Diagnosis not present

## 2024-01-02 DIAGNOSIS — Z743 Need for continuous supervision: Secondary | ICD-10-CM | POA: Diagnosis not present

## 2024-01-02 DIAGNOSIS — R0602 Shortness of breath: Secondary | ICD-10-CM | POA: Diagnosis not present

## 2024-01-02 DIAGNOSIS — G2581 Restless legs syndrome: Secondary | ICD-10-CM | POA: Diagnosis not present

## 2024-01-02 DIAGNOSIS — I361 Nonrheumatic tricuspid (valve) insufficiency: Secondary | ICD-10-CM | POA: Diagnosis not present

## 2024-01-02 DIAGNOSIS — J449 Chronic obstructive pulmonary disease, unspecified: Secondary | ICD-10-CM | POA: Diagnosis not present

## 2024-01-02 DIAGNOSIS — N179 Acute kidney failure, unspecified: Secondary | ICD-10-CM | POA: Diagnosis not present

## 2024-01-02 DIAGNOSIS — A419 Sepsis, unspecified organism: Secondary | ICD-10-CM | POA: Diagnosis not present

## 2024-01-02 DIAGNOSIS — R404 Transient alteration of awareness: Secondary | ICD-10-CM | POA: Diagnosis not present

## 2024-01-02 DIAGNOSIS — I2510 Atherosclerotic heart disease of native coronary artery without angina pectoris: Secondary | ICD-10-CM | POA: Diagnosis not present

## 2024-01-02 DIAGNOSIS — N185 Chronic kidney disease, stage 5: Secondary | ICD-10-CM | POA: Diagnosis not present

## 2024-01-02 DIAGNOSIS — R0603 Acute respiratory distress: Secondary | ICD-10-CM | POA: Diagnosis not present

## 2024-01-02 DIAGNOSIS — K219 Gastro-esophageal reflux disease without esophagitis: Secondary | ICD-10-CM | POA: Diagnosis not present

## 2024-01-02 DIAGNOSIS — F17210 Nicotine dependence, cigarettes, uncomplicated: Secondary | ICD-10-CM | POA: Diagnosis not present

## 2024-01-02 DIAGNOSIS — J4489 Other specified chronic obstructive pulmonary disease: Secondary | ICD-10-CM | POA: Diagnosis not present

## 2024-01-02 DIAGNOSIS — R918 Other nonspecific abnormal finding of lung field: Secondary | ICD-10-CM | POA: Diagnosis not present

## 2024-01-02 DIAGNOSIS — E1165 Type 2 diabetes mellitus with hyperglycemia: Secondary | ICD-10-CM | POA: Diagnosis not present

## 2024-01-02 DIAGNOSIS — R7881 Bacteremia: Secondary | ICD-10-CM | POA: Diagnosis not present

## 2024-01-02 DIAGNOSIS — E1142 Type 2 diabetes mellitus with diabetic polyneuropathy: Secondary | ICD-10-CM | POA: Diagnosis not present

## 2024-01-02 DIAGNOSIS — I129 Hypertensive chronic kidney disease with stage 1 through stage 4 chronic kidney disease, or unspecified chronic kidney disease: Secondary | ICD-10-CM | POA: Diagnosis not present

## 2024-01-02 DIAGNOSIS — J9601 Acute respiratory failure with hypoxia: Secondary | ICD-10-CM | POA: Diagnosis not present

## 2024-01-02 DIAGNOSIS — Z4682 Encounter for fitting and adjustment of non-vascular catheter: Secondary | ICD-10-CM | POA: Diagnosis not present

## 2024-01-02 DIAGNOSIS — D631 Anemia in chronic kidney disease: Secondary | ICD-10-CM | POA: Diagnosis not present

## 2024-01-02 DIAGNOSIS — Z9911 Dependence on respirator [ventilator] status: Secondary | ICD-10-CM | POA: Diagnosis not present

## 2024-01-02 DIAGNOSIS — N189 Chronic kidney disease, unspecified: Secondary | ICD-10-CM | POA: Diagnosis not present

## 2024-01-02 DIAGNOSIS — R6889 Other general symptoms and signs: Secondary | ICD-10-CM | POA: Diagnosis not present

## 2024-01-02 DIAGNOSIS — J9622 Acute and chronic respiratory failure with hypercapnia: Secondary | ICD-10-CM | POA: Diagnosis not present

## 2024-01-02 DIAGNOSIS — E119 Type 2 diabetes mellitus without complications: Secondary | ICD-10-CM | POA: Diagnosis not present

## 2024-01-02 DIAGNOSIS — R069 Unspecified abnormalities of breathing: Secondary | ICD-10-CM | POA: Diagnosis not present

## 2024-01-02 DIAGNOSIS — J9602 Acute respiratory failure with hypercapnia: Secondary | ICD-10-CM | POA: Diagnosis not present

## 2024-01-02 DIAGNOSIS — A4102 Sepsis due to Methicillin resistant Staphylococcus aureus: Secondary | ICD-10-CM | POA: Diagnosis not present

## 2024-01-02 DIAGNOSIS — J690 Pneumonitis due to inhalation of food and vomit: Secondary | ICD-10-CM | POA: Diagnosis not present

## 2024-01-02 DIAGNOSIS — N183 Chronic kidney disease, stage 3 unspecified: Secondary | ICD-10-CM | POA: Diagnosis not present

## 2024-01-02 DIAGNOSIS — B9562 Methicillin resistant Staphylococcus aureus infection as the cause of diseases classified elsewhere: Secondary | ICD-10-CM | POA: Diagnosis not present

## 2024-01-02 DIAGNOSIS — I059 Rheumatic mitral valve disease, unspecified: Secondary | ICD-10-CM | POA: Diagnosis not present

## 2024-01-02 DIAGNOSIS — Z978 Presence of other specified devices: Secondary | ICD-10-CM | POA: Diagnosis not present

## 2024-01-02 DIAGNOSIS — J9620 Acute and chronic respiratory failure, unspecified whether with hypoxia or hypercapnia: Secondary | ICD-10-CM | POA: Diagnosis not present

## 2024-01-03 LAB — CULTURE, BLOOD (ROUTINE X 2)
Culture: NO GROWTH
Culture: NO GROWTH
Special Requests: ADEQUATE
Special Requests: ADEQUATE

## 2024-01-04 ENCOUNTER — Telehealth: Payer: Self-pay | Admitting: Family Medicine

## 2024-01-04 NOTE — Telephone Encounter (Signed)
 Civil Summons(dropped off by [employer] in Dr. Shelah Lewandowsky box today.

## 2024-01-04 NOTE — Telephone Encounter (Signed)
 Krystal Burgess contacted Krystal Burgess and let her know to reach out to hospital for note saying she is hospitalized and can't meet court date

## 2024-01-07 ENCOUNTER — Telehealth: Payer: 59 | Admitting: Family Medicine

## 2024-01-07 NOTE — Telephone Encounter (Signed)
Attempted call to patient. Mail box full. Could not leave a voice mail message.  

## 2024-01-14 DIAGNOSIS — B9562 Methicillin resistant Staphylococcus aureus infection as the cause of diseases classified elsewhere: Secondary | ICD-10-CM | POA: Insufficient documentation

## 2024-01-14 DIAGNOSIS — J1100 Influenza due to unidentified influenza virus with unspecified type of pneumonia: Secondary | ICD-10-CM | POA: Insufficient documentation

## 2024-01-14 DIAGNOSIS — J15212 Pneumonia due to Methicillin resistant Staphylococcus aureus: Secondary | ICD-10-CM | POA: Insufficient documentation

## 2024-01-14 NOTE — Telephone Encounter (Signed)
Copied from CRM [id number]. Topic: General - Call Back - No Documentation >> Jan 14, 2024 10:48 AM Krystal Burgess wrote: Reason for CRM: Patient returning a call. No documentation. Patient is stating that the nurse can call Krystal Burgess because she is in the hospital. Patient plans on being released tomorrow and then will be going with Krystal Burgess, her medical POA.

## 2024-01-15 ENCOUNTER — Telehealth: Payer: Self-pay | Admitting: Family Medicine

## 2024-01-15 NOTE — Telephone Encounter (Signed)
Copied from CRM [id number]. Topic: General - Other >> Jan 15, 2024  2:17 PM Shelah Lewandowsky wrote: Reason for CRM: Samantha with [facility] they have not been able to reach patient to schedule visits, will have to delay, please call [phone]

## 2024-01-15 NOTE — Telephone Encounter (Signed)
Pt has an appt on monday

## 2024-01-15 NOTE — Telephone Encounter (Signed)
Thank you, need to get her scheduled for hos f/u. Ok for virtual probably end of next week.

## 2024-01-18 ENCOUNTER — Telehealth: Payer: Self-pay | Admitting: Family Medicine

## 2024-01-18 ENCOUNTER — Telehealth: Payer: Self-pay

## 2024-01-18 ENCOUNTER — Telehealth: Payer: 59 | Admitting: Family Medicine

## 2024-01-18 DIAGNOSIS — R197 Diarrhea, unspecified: Secondary | ICD-10-CM

## 2024-01-18 DIAGNOSIS — G8929 Other chronic pain: Secondary | ICD-10-CM

## 2024-01-18 DIAGNOSIS — I10 Essential (primary) hypertension: Secondary | ICD-10-CM | POA: Diagnosis not present

## 2024-01-18 DIAGNOSIS — F332 Major depressive disorder, recurrent severe without psychotic features: Secondary | ICD-10-CM | POA: Diagnosis not present

## 2024-01-18 DIAGNOSIS — J4489 Other specified chronic obstructive pulmonary disease: Secondary | ICD-10-CM | POA: Diagnosis not present

## 2024-01-18 DIAGNOSIS — M545 Low back pain, unspecified: Secondary | ICD-10-CM

## 2024-01-18 MED ORDER — TIZANIDINE HCL 2 MG PO TABS: 2.0000 mg | ORAL_TABLET | Freq: Two times a day (BID) | ORAL | 0 refills | Status: DC | PRN

## 2024-01-18 MED ORDER — DIPHENOXYLATE-ATROPINE 2.5-0.025 MG PO TABS: 1.0000 | ORAL_TABLET | Freq: Two times a day (BID) | ORAL | 0 refills | Status: DC | PRN

## 2024-01-18 NOTE — Progress Notes (Signed)
Established Patient Office Visit  Subjective  Patient ID: Krystal Burgess, female    DOB: 02-12-1961  Age: 63 y.o. MRN: [id number]  Chief Complaint  Patient presents with   Hospitalization Follow-up    HPI  She was admitted to [facility] health on January 11 and discharged home 14 days later on January 24 for respiratory failure secondary to MRSA pneumonia and MRSA bacteremia.  She was intubated upon arrival.  She had previously been hospitalized and was discharged home with IV daptomycin but had significant difficulty in getting the doses in she and her daughter were going through a hard time and her daughter did up getting a restraining order against her.  She was discharged home on linezolid for 14 days.  History of oxygen requiring asthma/COPD.  They did make several adjustments to her medication regimen because of polypharmacy.  She is quite frustrated with not having some of the medications that she needs.  She has been having diarrhea. She has to take the Zyvox for 14 days.    Says they stopped the amlodipine in the hospital.  Not sure to restart or not.   They did a psychiatry consult while she was there and they recommended decreasing her gabapentin down to 600 mg at bedtime.  They also recommended reducing her trazodone down to 50 mg at bedtime and decreasing her alprazolam down to 0.25 3 times daily from 1 mg.  I also recommended restarting Viibryd 20 mg daily.    ROS    Objective:     There were no vitals taken for this visit.   Physical Exam Vitals and nursing note reviewed.  Constitutional:      Appearance: Normal appearance.  HENT:     Head: Normocephalic and atraumatic.  Eyes:     Conjunctiva/sclera: Conjunctivae normal.  Cardiovascular:     Rate and Rhythm: Normal rate and regular rhythm.  Pulmonary:     Effort: Pulmonary effort is normal.     Breath sounds: Normal breath sounds.  Skin:    General: Skin is warm and dry.  Neurological:     Mental Status:  She is alert.  Psychiatric:        Mood and Affect: Mood normal.      No results found for any visits on 01/18/24.    The ASCVD Risk score (Arnett DK, et al., 2019) failed to calculate for the following reasons:   The valid total cholesterol range is 130 to 320 mg/dL    Assessment & Plan:   Problem List Items Addressed This Visit       Cardiovascular and Mediastinum   Essential hypertension   Will get home health nurse to check BP to see if we need to restart the amlodipine.          Respiratory   COPD (chronic obstructive pulmonary disease) with chronic bronchitis (HCC)   Continue 3 L oxygen.  Will discontinue theophylline.  She has been on it for years and I really just do not think that it really has made any difference and so far she is actually doing well without it is much as we can decrease her medication regimen I think it would actually be really helpful she is also on multiple sedatives including alprazolam, trazodone, gabapentin, and narcotics.  They really wanted to work on weaning down some of these daily sedatives that she takes.  She also normally takes a muscle relaxer.        Other   MDD (  major depressive disorder), recurrent episode, severe (HCC)   Depression and anxiety-she is on alprazolam 1 mg 3 times daily and this is written by Dr. Jettie Booze time.  They had suggested at her hospital discharge that this medication be tapered down because of polypharmacy and worry for excess sedation.      Chronic back pain   She would like a refill on her muscle relaxer.  Will go ahead and refill but I am going to decrease her dose again trying to decrease how many sedatives she is currently taking.      Relevant Medications   tiZANidine (ZANAFLEX) 2 MG tablet   Other Visit Diagnoses       Diarrhea, unspecified type    -  Primary   Relevant Medications   diphenoxylate-atropine (LOMOTIL) 2.5-0.025 MG tablet       Diarrhea -discussed that if the diarrhea does not  improve after she completes the antibiotic to please let us know it may be worth checking her for C. difficile.  In the meantime I will send over medication for diarrhea.  No follow-ups on file.    I spent 40 minutes on the day of the encounter to include pre-visit record review, face-to-face time with the patient and post visit ordering of test.   Nani Gasser, MD

## 2024-01-18 NOTE — Telephone Encounter (Signed)
Called and spoke w/Brooke she stated that this message was just informational.

## 2024-01-18 NOTE — Assessment & Plan Note (Signed)
Continue 3 L oxygen.  Will discontinue theophylline.  She has been on it for years and I really just do not think that it really has made any difference and so far she is actually doing well without it is much as we can decrease her medication regimen I think it would actually be really helpful she is also on multiple sedatives including alprazolam, trazodone, gabapentin, and narcotics.  They really wanted to work on weaning down some of these daily sedatives that she takes.  She also normally takes a muscle relaxer.

## 2024-01-18 NOTE — Assessment & Plan Note (Signed)
Depression and anxiety-she is on alprazolam 1 mg 3 times daily and this is written by Dr. Jettie Booze time.  They had suggested at her hospital discharge that this medication be tapered down because of polypharmacy and worry for excess sedation.

## 2024-01-18 NOTE — Assessment & Plan Note (Signed)
She would like a refill on her muscle relaxer.  Will go ahead and refill but I am going to decrease her dose again trying to decrease how many sedatives she is currently taking.

## 2024-01-18 NOTE — Transitions of Care (Post Inpatient/ED Visit) (Signed)
01/18/2024  Name: Krystal Burgess MRN: [id number] DOB: 05/09/1961  Today's TOC FU Call Status: Today's TOC FU Call Status:: Successful TOC FU Call Completed TOC FU Call Complete Date: 01/15/24 Patient's Name and Date of Birth confirmed.  Transition Care Management Follow-up Telephone Call Date of Discharge: 01/15/24 Discharge Facility: Other ([facility]) Name of [facility] ([facility]) [facility]charge Facility: [facility] Type of Discharge: Inpatient Admission Primary Inpatient Discharge Diagnosis:: Acute Respiratory Failure; Influenza A How have you been since you were released from the hospital?: Better Any questions or concerns?: Yes Patient Questions/Concerns:: Patient notes her breathing medications were changed in the hospital Patient Questions/Concerns Addressed: Other: (Patient has HFU wioht her PCP this afternoon and will discuss with her)  Items Reviewed: Did you receive and understand the discharge instructions provided?: Yes Medications obtained,verified, and reconciled?: Yes (Medications Reviewed) Any new allergies since your discharge?: No Dietary orders reviewed?: No Do you have support at home?: Yes People in Home: friend(s) Name of Support/Comfort Primary Source: Krystal Burgess  Medications Reviewed Today: Medications Reviewed Today     Reviewed by Krystal Gross, RN (Case Manager) on 01/18/24 at 1047  Med List Status: <None>   Medication Order Taking? Sig Documenting Provider Last Dose Status Informant  albuterol (VENTOLIN HFA) 108 (90 Base) MCG/ACT inhaler [id number] No INHALE 2 PUFFS INTO THE LUNGS EVERY 4 HOURS AS NEEDED FOR WHEEZING OR SHORTNESS OF BREATH.  Patient not taking: Reported on 01/18/2024   Krystal Games, MD Not Taking Active Pharmacy Records, Self           Med Note Krystal Burgess, [facility]   Mon Jan 18, 2024 10:39 AM) Discontinued with recent hospital admission  ALPRAZolam Prudy Feeler) 1 MG tablet [id number] Yes Take 1 tablet (1 mg  total) by mouth 3 (three) times daily as needed for anxiety. Krystal Burgess D, DO Taking Active   Krystal Burgess MEDICATION [id number] Yes Battery powered portable oxygen concentrator Krystal Games, MD Taking Active Self  Krystal Burgess MEDICATION [id number] Yes Battery powered portable nebulizer Krystal Games, MD Taking Active Self  amLODipine (NORVASC) 5 MG tablet [id number] Yes Take 5 mg by mouth daily. [provider] Taking Active Self           Med Note [facility] [place]   Tue Sep 22, 2023  1:54 PM) Patient reports started back 09/20/23  azelastine (ASTELIN) 0.1 % nasal spray [id number] Yes Place 2 sprays into both nostrils 2 (two) times daily. Use in each nostril as directed Krystal Games, MD Taking Active Self  Blood Glucose Monitoring Suppl DEVI [id number] Yes 1 each by Does not apply route daily as needed. May substitute to any manufacturer covered by patient's insurance. Krystal Games, MD Taking Active Self  brimonidine-timolol (COMBIGAN) 0.2-0.5 % ophthalmic solution [id number] Yes Place 1 drop into the right eye every 12 (twelve) hours. [provider] Taking Active   buprenorphine (BUTRANS) 15 MCG/HR [id number] No Place 1 patch onto the skin once a week. [provider] Unknown Active Self  Buprenorphine HCl (BELBUCA) 600 MCG FILM [id number] Yes Place 600 mcg inside cheek every 12 (twelve) hours. [provider] Taking Active   DAPTOmycin (CUBICIN) 500 MG injection [id number] No Inject into the vein daily.  Patient not taking: Reported on 01/18/2024   [provider] Not Taking Active            Med Note Krystal Burgess Burgess   Tue Dec 29, 2023  9:49 AM) Pt states that  daughter is in control of this medication and to the pt knowledge she's hasn't had a dose since Thursday or Friday.    fluticasone (FLONASE) 50 MCG/ACT nasal spray [id number] Yes SPRAY 2 SPRAYS INTO EACH NOSTRIL EVERY DAY Krystal Burgess, Krystal L, PA-C  Taking Active Self  folic acid (FOLVITE) 1 MG tablet [id number] Yes TAKE 1 TABLET BY MOUTH EVERY DAY Krystal Games, MD Taking Active Self  furosemide (LASIX) 20 MG tablet [id number] Yes Take 1 tablet (20 mg total) by mouth daily as needed. Krystal Games, MD Taking Active Self  gabapentin (NEURONTIN) 600 MG tablet [id number] Yes TAKE 2 TABLETS (1,200 MG TOTAL) BY MOUTH AT BEDTIME. PATIENT TAKES 1200 MG DAILY Krystal Games, MD Taking Active Self           Med Note Krystal Burgess, [facility]   Mon Jan 18, 2024 10:43 AM) Per recent hospital records, patient's dose was changed to 600mg  PO at HS  ipratropium (ATROVENT) 0.06 % nasal spray [id number] Yes Place 2 sprays into both nostrils 3 (three) times daily. Krystal Games, MD Taking Active Self           Med Note Colletta [place]   Tue Sep 22, 2023  1:47 PM) Per patient Uses as needed  ipratropium-albuterol (DUONEB) 0.5-2.5 (3) MG/3ML SOLN [id number] Yes INHALE 3 MLS INTO THE LUNGS EVERY 2 (TWO) HOURS AS NEEDED (WHEEZING OR SHORTNESS OF BREATH). Krystal Games, MD Taking Active Self  levothyroxine (SYNTHROID) 88 MCG tablet [id number] Yes TAKE 1 TABLET BY MOUTH EVERY DAY Krystal Games, MD Taking Active Self  linezolid (ZYVOX) 600 MG tablet [id number] Yes Take 600 mg by mouth 2 (two) times daily. [provider] Taking Active   losartan (COZAAR) 100 MG tablet [id number]  TAKE 1 TABLET BY MOUTH EVERY DAY Krystal Games, MD  Active Self  ondansetron (ZOFRAN) 8 MG tablet [id number] Yes Take 1 tablet (8 mg total) by mouth every 8 (eight) hours as needed for nausea or vomiting. Krystal Hartshorn, MD Taking Active Self  pantoprazole (PROTONIX) 40 MG tablet [id number] Yes TAKE 1 TABLET BY MOUTH EVERY DAY Krystal Games, MD Taking Active Self  predniSONE (DELTASONE) 5 MG tablet [id number] No TAKE 1/2 TABLET (2.5 MG TOTAL) BY MOUTH DAILY WITH BREAKFAST.  Patient not taking: No sig reported   Krystal Games, MD Not  Taking Active Self  rosuvastatin (CRESTOR) 20 MG tablet [id number] Yes Take 1 tablet (20 mg total) by mouth at bedtime. Krystal Games, MD Taking Active Self  theophylline (THEODUR) 300 MG 12 hr tablet [id number] No TAKE 1 TABLET BY MOUTH TWICE A DAY  Patient not taking: Reported on 01/18/2024   Krystal Games, MD Not Taking Active Self           Med Note Krystal Burgess [facility]   Mon Jan 18, 2024 10:45 AM) Discontinued with 01/15/24 Hospital visit  tiZANidine (ZANAFLEX) 4 MG tablet [id number] No Take 1 tablet (4 mg total) by mouth 2 (two) times daily as needed for muscle spasms.  Patient not taking: Reported on 01/18/2024   Krystal Games, MD Not Taking Active Self           Med Note Krystal Burgess [facility]   Mon Jan 18, 2024 10:46 AM) Discontinued with recent hospital discharge  traZODone (DESYREL) 50 MG tablet [id number]  Take 50 mg by mouth at bedtime as needed for sleep. Aletha Halim, MD  Active Self           Med  Note Colletta [place]   Tue Sep 22, 2023  1:44 PM) Reports takes 150 mg every night  TRELEGY ELLIPTA 100-62.5-25 MCG/ACT AEPB [id number] No INHALE 1 PUFF BY MOUTH EVERY DAY Krystal Games, MD Unknown Active Self  Vilazodone HCl 20 MG TABS [id number] Yes Take 1 tablet (20 mg total) by mouth daily. Krystal Burgess D, DO Taking Active             Home Care and Equipment/Supplies: Were Home Health Services Ordered?: Yes Name of Home Health Agency:: [facility] Has Agency set up a time to come to your home?: Yes First Home Health Visit Date: 01/20/24 Any new equipment or medical supplies ordered?: No  Functional Questionnaire: Do you need assistance with bathing/showering or dressing?: Yes Do you need assistance with meal preparation?: Yes Do you need assistance with eating?: No Do you have difficulty maintaining continence: Yes Do you need assistance with getting out of bed/getting out of a chair/moving?: Yes Do you have difficulty managing or taking your  medications?: No  Follow up appointments reviewed: PCP Follow-up appointment confirmed?: Yes Date of PCP follow-up appointment?: 01/18/24 Follow-up Provider: [name] Specialist [facility] Follow-up appointment confirmed?: NA Do you need transportation to your follow-up appointment?: No Do you understand care options if your condition(s) worsen?: Yes-patient verbalized understanding  SDOH Interventions Today    Flowsheet Row Most Recent Value  SDOH Interventions   Food Insecurity Interventions Intervention Not Indicated  Housing Interventions Intervention Not Indicated  Transportation Interventions Intervention Not Indicated  Utilities Interventions Intervention Not Indicated      TOC Interventions Today    Flowsheet Row Most Recent Value  TOC Interventions   TOC Interventions Discussed/Reviewed TOC Interventions Discussed, TOC Interventions Reviewed, Contacted Home Health RN/OT/PT  [Call placed to [facility] and confirmed they will be seeing patient on 01/20/24]       Krystal Gross RN, BSN, CCM [place]  Value Based Care Institute Manager Population Health Direct Dial: [phone]  Fax: [phone]

## 2024-01-18 NOTE — Telephone Encounter (Signed)
Call pt: I was getting ready to refill the alprazolam and it looks like this was written by Dr. Estella Husk,   Because it is a scheduled drug that is who she will need to get the refills through.

## 2024-01-18 NOTE — Assessment & Plan Note (Signed)
Will get home health nurse to check BP to see if we need to restart the amlodipine.

## 2024-01-19 DIAGNOSIS — J449 Chronic obstructive pulmonary disease, unspecified: Secondary | ICD-10-CM | POA: Diagnosis not present

## 2024-01-19 NOTE — Telephone Encounter (Signed)
Attempted call to patient. Voice mail not yet set up. Could not leave a voicemail message.

## 2024-01-20 ENCOUNTER — Telehealth: Payer: Self-pay

## 2024-01-20 ENCOUNTER — Other Ambulatory Visit: Payer: Medicare Other

## 2024-01-20 DIAGNOSIS — E559 Vitamin D deficiency, unspecified: Secondary | ICD-10-CM | POA: Diagnosis not present

## 2024-01-20 DIAGNOSIS — M48061 Spinal stenosis, lumbar region without neurogenic claudication: Secondary | ICD-10-CM | POA: Diagnosis not present

## 2024-01-20 DIAGNOSIS — J9622 Acute and chronic respiratory failure with hypercapnia: Secondary | ICD-10-CM | POA: Diagnosis not present

## 2024-01-20 DIAGNOSIS — M51369 Other intervertebral disc degeneration, lumbar region without mention of lumbar back pain or lower extremity pain: Secondary | ICD-10-CM | POA: Diagnosis not present

## 2024-01-20 DIAGNOSIS — J441 Chronic obstructive pulmonary disease with (acute) exacerbation: Secondary | ICD-10-CM | POA: Diagnosis not present

## 2024-01-20 DIAGNOSIS — E1122 Type 2 diabetes mellitus with diabetic chronic kidney disease: Secondary | ICD-10-CM | POA: Diagnosis not present

## 2024-01-20 DIAGNOSIS — L89890 Pressure ulcer of other site, unstageable: Secondary | ICD-10-CM | POA: Diagnosis not present

## 2024-01-20 DIAGNOSIS — N185 Chronic kidney disease, stage 5: Secondary | ICD-10-CM | POA: Diagnosis not present

## 2024-01-20 DIAGNOSIS — E8809 Other disorders of plasma-protein metabolism, not elsewhere classified: Secondary | ICD-10-CM | POA: Diagnosis not present

## 2024-01-20 DIAGNOSIS — E1142 Type 2 diabetes mellitus with diabetic polyneuropathy: Secondary | ICD-10-CM | POA: Diagnosis not present

## 2024-01-20 DIAGNOSIS — N179 Acute kidney failure, unspecified: Secondary | ICD-10-CM | POA: Diagnosis not present

## 2024-01-20 DIAGNOSIS — E538 Deficiency of other specified B group vitamins: Secondary | ICD-10-CM | POA: Diagnosis not present

## 2024-01-20 DIAGNOSIS — I120 Hypertensive chronic kidney disease with stage 5 chronic kidney disease or end stage renal disease: Secondary | ICD-10-CM | POA: Diagnosis not present

## 2024-01-20 DIAGNOSIS — J9621 Acute and chronic respiratory failure with hypoxia: Secondary | ICD-10-CM | POA: Diagnosis not present

## 2024-01-20 DIAGNOSIS — E46 Unspecified protein-calorie malnutrition: Secondary | ICD-10-CM | POA: Diagnosis not present

## 2024-01-20 DIAGNOSIS — J440 Chronic obstructive pulmonary disease with acute lower respiratory infection: Secondary | ICD-10-CM | POA: Diagnosis not present

## 2024-01-20 DIAGNOSIS — A4102 Sepsis due to Methicillin resistant Staphylococcus aureus: Secondary | ICD-10-CM | POA: Diagnosis not present

## 2024-01-20 DIAGNOSIS — E039 Hypothyroidism, unspecified: Secondary | ICD-10-CM | POA: Diagnosis not present

## 2024-01-20 DIAGNOSIS — E785 Hyperlipidemia, unspecified: Secondary | ICD-10-CM | POA: Diagnosis not present

## 2024-01-20 DIAGNOSIS — G8929 Other chronic pain: Secondary | ICD-10-CM | POA: Diagnosis not present

## 2024-01-20 NOTE — Patient Outreach (Addendum)
  Care Coordination   Follow Up Visit Note   01/20/2024 Name: Krystal Burgess MRN: [id number] DOB: 01/29/1961  Krystal Burgess is a 63 y.o. year old female who sees Metheney, Barbarann Ehlers, MD for primary care. I spoke with  Krystal Burgess by phone today.  What matters to the patients health and wellness today?  Successful outreach to patient this morning for follow up from our 01/18/24 conversation.  Patient had a follow up appointment with her PCP on 01/18/24.  She notes she is feeling better since her discharge from the hospital.  She has a concern about her Butrans (Butrensphine) Transdermal system was not refilled.  This Clinical research associate sent message to her PCP to inquire if she would refill medication.  Patient is expecting HHRN at noon today to evaluate blood pressure.   Goals Addressed   None    SDOH assessments and interventions completed:  Yes SDOH Interventions Today    Flowsheet Row Most Recent Value  SDOH Interventions   Food Insecurity Interventions Intervention Not Indicated  Housing Interventions Intervention Not Indicated  Transportation Interventions Intervention Not Indicated  Utilities Interventions Intervention Not Indicated     Care Coordination Interventions:  Yes, provided  Follow up plan:  Plan to call patient after this writer receives response from PCP   Encounter Outcome:  Patient Visit Completed  Jodelle Gross RN, BSN, CCM [place]  Value Based Care Institute Manager Population Health Direct Dial: [phone]  Fax: [phone]

## 2024-01-20 NOTE — Patient Outreach (Signed)
  Care Coordination   01/20/2024 Name: Krystal Burgess MRN: [id number] DOB: June 30, 1961   Successful outreach to patient after receiving response from [name]. Advised patient she would need to reach out to the physician who prescribed the Butrams Transdermal System as [name] states she did not prescribe.  Patient says she does not know who prescribed, she thinks it was from one of her hospital admissions but she is going to investigate.  Jodelle Gross RN, BSN, CCM [place]  Value Based Care Institute Manager Population Health Direct Dial: [phone]  Fax: [phone]

## 2024-01-20 NOTE — Telephone Encounter (Signed)
Yes okay for home health, PT aide and social worker.  They can fax [place] the paperwork and I will sign off.

## 2024-01-20 NOTE — Telephone Encounter (Unsigned)
Copied from CRM [id number]. Topic: Clinical - Home Health Verbal Orders >> Jan 20, 2024  2:15 PM Gildardo Pounds wrote: Caller/Agency: Lanae Boast, physical therapist for [facility] Callback Number: [phone] Service Requested: Physical Therapy, Home Health Aid and Social worker Frequency: Physical Therapy 1 a week for 9 week, home health 1 a week for 4 weeks; nursing assistance for COPD education and wound assessment Any new concerns about the patient? No

## 2024-01-21 NOTE — Telephone Encounter (Signed)
Left message for verbal order.

## 2024-01-22 ENCOUNTER — Telehealth: Payer: Self-pay

## 2024-01-22 NOTE — Telephone Encounter (Signed)
Copied from CRM [id number]. Topic: Clinical - Home Health Verbal Orders >> Jan 22, 2024 12:20 PM Desma Mcgregor wrote: Caller/Agency: Neysa Bonito [place]  Callback Number: [phone] Service Requested:  Requesting Home Health Aide and Nursing removed. Start services on Monday 01/25/24 Frequency: n/a  Any new concerns about the patient? No There is a request already sent for [facility]. Please confirm if the patient is going to go with them or [facility]. Neysa Bonito is also going to follow up with the patient.

## 2024-01-25 ENCOUNTER — Other Ambulatory Visit: Payer: Self-pay | Admitting: Family Medicine

## 2024-01-25 DIAGNOSIS — M51369 Other intervertebral disc degeneration, lumbar region without mention of lumbar back pain or lower extremity pain: Secondary | ICD-10-CM | POA: Diagnosis not present

## 2024-01-25 DIAGNOSIS — E039 Hypothyroidism, unspecified: Secondary | ICD-10-CM | POA: Diagnosis not present

## 2024-01-25 DIAGNOSIS — I120 Hypertensive chronic kidney disease with stage 5 chronic kidney disease or end stage renal disease: Secondary | ICD-10-CM | POA: Diagnosis not present

## 2024-01-25 DIAGNOSIS — N179 Acute kidney failure, unspecified: Secondary | ICD-10-CM | POA: Diagnosis not present

## 2024-01-25 DIAGNOSIS — L89890 Pressure ulcer of other site, unstageable: Secondary | ICD-10-CM | POA: Diagnosis not present

## 2024-01-25 DIAGNOSIS — E559 Vitamin D deficiency, unspecified: Secondary | ICD-10-CM | POA: Diagnosis not present

## 2024-01-25 DIAGNOSIS — A4102 Sepsis due to Methicillin resistant Staphylococcus aureus: Secondary | ICD-10-CM | POA: Diagnosis not present

## 2024-01-25 DIAGNOSIS — E1142 Type 2 diabetes mellitus with diabetic polyneuropathy: Secondary | ICD-10-CM | POA: Diagnosis not present

## 2024-01-25 DIAGNOSIS — E8809 Other disorders of plasma-protein metabolism, not elsewhere classified: Secondary | ICD-10-CM | POA: Diagnosis not present

## 2024-01-25 DIAGNOSIS — M48061 Spinal stenosis, lumbar region without neurogenic claudication: Secondary | ICD-10-CM | POA: Diagnosis not present

## 2024-01-25 DIAGNOSIS — E1122 Type 2 diabetes mellitus with diabetic chronic kidney disease: Secondary | ICD-10-CM | POA: Diagnosis not present

## 2024-01-25 DIAGNOSIS — N185 Chronic kidney disease, stage 5: Secondary | ICD-10-CM | POA: Diagnosis not present

## 2024-01-25 DIAGNOSIS — J9621 Acute and chronic respiratory failure with hypoxia: Secondary | ICD-10-CM | POA: Diagnosis not present

## 2024-01-25 DIAGNOSIS — J440 Chronic obstructive pulmonary disease with acute lower respiratory infection: Secondary | ICD-10-CM | POA: Diagnosis not present

## 2024-01-25 DIAGNOSIS — J9622 Acute and chronic respiratory failure with hypercapnia: Secondary | ICD-10-CM | POA: Diagnosis not present

## 2024-01-25 DIAGNOSIS — J441 Chronic obstructive pulmonary disease with (acute) exacerbation: Secondary | ICD-10-CM | POA: Diagnosis not present

## 2024-01-25 DIAGNOSIS — E538 Deficiency of other specified B group vitamins: Secondary | ICD-10-CM | POA: Diagnosis not present

## 2024-01-25 DIAGNOSIS — G8929 Other chronic pain: Secondary | ICD-10-CM | POA: Diagnosis not present

## 2024-01-25 DIAGNOSIS — E46 Unspecified protein-calorie malnutrition: Secondary | ICD-10-CM | POA: Diagnosis not present

## 2024-01-25 DIAGNOSIS — E785 Hyperlipidemia, unspecified: Secondary | ICD-10-CM | POA: Diagnosis not present

## 2024-01-25 NOTE — Telephone Encounter (Signed)
CRM did not copy over as intended.   Refill is for Duoneb to [facility] on [address]  Patient also requesting a medication list be sent to her via MyChart.

## 2024-01-25 NOTE — Telephone Encounter (Signed)
K, she can go with the first 1 that has initiated care.  Maybe the hospital had ordered it and we had ordered it I am not sure.

## 2024-01-26 ENCOUNTER — Encounter: Payer: Self-pay | Admitting: Family Medicine

## 2024-01-26 DIAGNOSIS — M48061 Spinal stenosis, lumbar region without neurogenic claudication: Secondary | ICD-10-CM | POA: Diagnosis not present

## 2024-01-26 DIAGNOSIS — I120 Hypertensive chronic kidney disease with stage 5 chronic kidney disease or end stage renal disease: Secondary | ICD-10-CM | POA: Diagnosis not present

## 2024-01-26 DIAGNOSIS — E46 Unspecified protein-calorie malnutrition: Secondary | ICD-10-CM | POA: Diagnosis not present

## 2024-01-26 DIAGNOSIS — J441 Chronic obstructive pulmonary disease with (acute) exacerbation: Secondary | ICD-10-CM | POA: Diagnosis not present

## 2024-01-26 DIAGNOSIS — E039 Hypothyroidism, unspecified: Secondary | ICD-10-CM | POA: Diagnosis not present

## 2024-01-26 DIAGNOSIS — J9621 Acute and chronic respiratory failure with hypoxia: Secondary | ICD-10-CM | POA: Diagnosis not present

## 2024-01-26 DIAGNOSIS — E8809 Other disorders of plasma-protein metabolism, not elsewhere classified: Secondary | ICD-10-CM | POA: Diagnosis not present

## 2024-01-26 DIAGNOSIS — E1122 Type 2 diabetes mellitus with diabetic chronic kidney disease: Secondary | ICD-10-CM | POA: Diagnosis not present

## 2024-01-26 DIAGNOSIS — E785 Hyperlipidemia, unspecified: Secondary | ICD-10-CM | POA: Diagnosis not present

## 2024-01-26 DIAGNOSIS — J9622 Acute and chronic respiratory failure with hypercapnia: Secondary | ICD-10-CM | POA: Diagnosis not present

## 2024-01-26 DIAGNOSIS — E1142 Type 2 diabetes mellitus with diabetic polyneuropathy: Secondary | ICD-10-CM | POA: Diagnosis not present

## 2024-01-26 DIAGNOSIS — M51369 Other intervertebral disc degeneration, lumbar region without mention of lumbar back pain or lower extremity pain: Secondary | ICD-10-CM | POA: Diagnosis not present

## 2024-01-26 DIAGNOSIS — N185 Chronic kidney disease, stage 5: Secondary | ICD-10-CM | POA: Diagnosis not present

## 2024-01-26 DIAGNOSIS — A4102 Sepsis due to Methicillin resistant Staphylococcus aureus: Secondary | ICD-10-CM | POA: Diagnosis not present

## 2024-01-26 DIAGNOSIS — J440 Chronic obstructive pulmonary disease with acute lower respiratory infection: Secondary | ICD-10-CM | POA: Diagnosis not present

## 2024-01-26 DIAGNOSIS — N179 Acute kidney failure, unspecified: Secondary | ICD-10-CM | POA: Diagnosis not present

## 2024-01-26 DIAGNOSIS — L89890 Pressure ulcer of other site, unstageable: Secondary | ICD-10-CM | POA: Diagnosis not present

## 2024-01-26 DIAGNOSIS — E559 Vitamin D deficiency, unspecified: Secondary | ICD-10-CM | POA: Diagnosis not present

## 2024-01-26 DIAGNOSIS — E538 Deficiency of other specified B group vitamins: Secondary | ICD-10-CM | POA: Diagnosis not present

## 2024-01-26 DIAGNOSIS — G8929 Other chronic pain: Secondary | ICD-10-CM | POA: Diagnosis not present

## 2024-01-26 NOTE — Telephone Encounter (Signed)
 Copied from CRM [id number]. Topic: Referral - Request for Referral >> Jan 25, 2024  8:57 AM Joesph PARAS wrote: Did the patient discuss referral with their provider in the last year? No  Appointment offered? No - Recommended by Hospital  Type of order/referral and detailed reason for visit: Home Health - Patient states needs a nurse to assist at home - states may be a blood draw but is unsure what tasks they may need to perform   Preference of office, provider, location: None  If referral order, have you been seen by this specialty before? Yes - Has a PT provider  Can we respond through MyChart? No - Phone Call

## 2024-01-26 NOTE — Telephone Encounter (Signed)
Spoke with representative at [employer]- states they spoke with patient and care has already been started with [facility].

## 2024-01-26 NOTE — Telephone Encounter (Signed)
Spoke with pt she stated that the hospital has taken care of the [facility].  She is asking for a prescription for a walker, transfer board and Zofran the Phenergan is not working well [facility] [address] [employer].

## 2024-01-27 ENCOUNTER — Telehealth: Payer: Self-pay

## 2024-01-27 DIAGNOSIS — J9622 Acute and chronic respiratory failure with hypercapnia: Secondary | ICD-10-CM | POA: Diagnosis not present

## 2024-01-27 DIAGNOSIS — E785 Hyperlipidemia, unspecified: Secondary | ICD-10-CM | POA: Diagnosis not present

## 2024-01-27 DIAGNOSIS — E1122 Type 2 diabetes mellitus with diabetic chronic kidney disease: Secondary | ICD-10-CM | POA: Diagnosis not present

## 2024-01-27 DIAGNOSIS — E1142 Type 2 diabetes mellitus with diabetic polyneuropathy: Secondary | ICD-10-CM | POA: Diagnosis not present

## 2024-01-27 DIAGNOSIS — J9621 Acute and chronic respiratory failure with hypoxia: Secondary | ICD-10-CM | POA: Diagnosis not present

## 2024-01-27 DIAGNOSIS — G8929 Other chronic pain: Secondary | ICD-10-CM | POA: Diagnosis not present

## 2024-01-27 DIAGNOSIS — M48061 Spinal stenosis, lumbar region without neurogenic claudication: Secondary | ICD-10-CM | POA: Diagnosis not present

## 2024-01-27 DIAGNOSIS — I120 Hypertensive chronic kidney disease with stage 5 chronic kidney disease or end stage renal disease: Secondary | ICD-10-CM | POA: Diagnosis not present

## 2024-01-27 DIAGNOSIS — J440 Chronic obstructive pulmonary disease with acute lower respiratory infection: Secondary | ICD-10-CM | POA: Diagnosis not present

## 2024-01-27 DIAGNOSIS — N185 Chronic kidney disease, stage 5: Secondary | ICD-10-CM | POA: Diagnosis not present

## 2024-01-27 DIAGNOSIS — N179 Acute kidney failure, unspecified: Secondary | ICD-10-CM | POA: Diagnosis not present

## 2024-01-27 DIAGNOSIS — E559 Vitamin D deficiency, unspecified: Secondary | ICD-10-CM | POA: Diagnosis not present

## 2024-01-27 DIAGNOSIS — M51369 Other intervertebral disc degeneration, lumbar region without mention of lumbar back pain or lower extremity pain: Secondary | ICD-10-CM | POA: Diagnosis not present

## 2024-01-27 DIAGNOSIS — L89890 Pressure ulcer of other site, unstageable: Secondary | ICD-10-CM | POA: Diagnosis not present

## 2024-01-27 DIAGNOSIS — E039 Hypothyroidism, unspecified: Secondary | ICD-10-CM | POA: Diagnosis not present

## 2024-01-27 DIAGNOSIS — A4102 Sepsis due to Methicillin resistant Staphylococcus aureus: Secondary | ICD-10-CM | POA: Diagnosis not present

## 2024-01-27 DIAGNOSIS — E46 Unspecified protein-calorie malnutrition: Secondary | ICD-10-CM | POA: Diagnosis not present

## 2024-01-27 DIAGNOSIS — J441 Chronic obstructive pulmonary disease with (acute) exacerbation: Secondary | ICD-10-CM | POA: Diagnosis not present

## 2024-01-27 DIAGNOSIS — E538 Deficiency of other specified B group vitamins: Secondary | ICD-10-CM | POA: Diagnosis not present

## 2024-01-27 DIAGNOSIS — E8809 Other disorders of plasma-protein metabolism, not elsewhere classified: Secondary | ICD-10-CM | POA: Diagnosis not present

## 2024-01-27 NOTE — Telephone Encounter (Signed)
 Copied from CRM [id number]. Topic: Clinical - Medical Advice >> Jan 25, 2024  3:59 PM Joesph PARAS wrote: Reason for CRM:  Equipment - Social Worker advised patient to request a portable battery powered oxygen  concentrator ([employer] in [employer]) and fpl group (slide from bed to chair or other location; ADAPT per patient).   Medication - Antibiotic LINEZOLIV (600mg  2x daily) is so strong that she cannot take it. Social Worker requesting an alternative be prescribed to clear out the [facility].

## 2024-01-27 NOTE — Telephone Encounter (Signed)
 Per patient she has taken one week of Linezoliv and had one more week remaining -  She had extreme diarrhea ( about 4 times per hour ) causing dehydration , weakness, and skin breakdown as she became to weak to change incontinence briefs.  She states that she had told them that she could have done the at home IV as the girl she is living with could do it for her  but for some reason they chose not to send this / She thought that Dr. Alvan had sent in an abx yesterday but after our conversation she did not see where one had been sent

## 2024-01-27 NOTE — Telephone Encounter (Signed)
 K different order for both supplies.  In regards to the antibiotic I unfortunately do not know that she really has a lot of choices.  They had originally sent her home with IV infusion antibiotics and she was unable to administer them and that landed her back in the hospital and now she is on oral.  How many more days does she have?  Is she having diarrhea from it?

## 2024-01-28 ENCOUNTER — Telehealth: Payer: Self-pay

## 2024-01-28 NOTE — Transitions of Care (Post Inpatient/ED Visit) (Signed)
   01/28/2024  Name: Krystal Burgess MRN: [id number] DOB: 02/06/1961  Today's TOC FU Call Status: Today's TOC FU Call Status:: Unsuccessful Call (1st Attempt) Unsuccessful Call (1st Attempt) Date: 01/28/24  Attempted to reach the patient regarding the most recent Inpatient/ED visit.  Follow Up Plan: Additional outreach attempts will be made to reach the patient to complete the Transitions of Care (Post Inpatient/ED visit) call.   Signature Julian Lemmings, LPN [facility] Nurse Health Advisor Direct Dial [phone]

## 2024-01-29 NOTE — Telephone Encounter (Signed)
 Copied from CRM [id number]. Topic: Clinical - Medication Question >> Jan 29, 2024  8:37 AM Bascom S wrote: Reason for CRM: Patient was prescribed a strong antibiotic when she was released from the hospital. Patient cannot take the linezolid (ZYVOX) 600 MG tablet because of the side effect. Patient also needs a portable oxygen  concentrator. Callback number is [phone]. Patient states this is her second message

## 2024-01-29 NOTE — Telephone Encounter (Signed)
 Okay to go ahead and stop the antibiotic though she may have already done so.  Many more days which she is supposed to complete?

## 2024-01-29 NOTE — Transitions of Care (Post Inpatient/ED Visit) (Signed)
 01/29/2024  Name: Krystal Burgess MRN: [id number] DOB: [birth year]  Today's TOC FU Call Status: Today's TOC FU Call Status:: Successful TOC FU Call Completed Unsuccessful Call (1st Attempt) Date: 01/28/24 [facility] FU Call Complete Date: 01/29/24 Patient's Name and Date of Birth confirmed.  Transition Care Management Follow-up Telephone Call Date of Discharge: 01/27/24 Discharge Facility: Other ([facility]) Name of Other ([facility]) [facility]charge Facility: [facility] Type of Discharge: Inpatient Admission How have you been since you were released from the hospital?: Better Any questions or concerns?: No  Items Reviewed: Did you receive and understand the discharge instructions provided?: Yes Medications obtained,verified, and reconciled?: Yes (Medications Reviewed) Any new allergies since your discharge?: No Dietary orders reviewed?: Yes People in Home: friend(s)  Medications Reviewed Today: Medications Reviewed Today     Reviewed by Emmitt Pan, LPN (Licensed Practical Nurse) on 01/29/24 at 1051  Med List Status: <None>   Medication Order Taking? Sig Documenting Provider Last Dose Status Informant  ALPRAZolam  (XANAX ) 1 MG tablet [id number] No Take 1 tablet (1 mg total) by mouth 3 (three) times daily as needed for anxiety. Maree Bracken D, DO Taking Active   AMBULATORY JESSE SCHLOSSMAN MEDICATION [id number] No Battery powered portable oxygen  concentrator Alvan Dorothyann BIRCH, MD Taking Active Self  Blood Glucose Monitoring Suppl DEVI [id number] No 1 each by Does not apply route daily as needed. May substitute to any manufacturer covered by patient's insurance. Alvan Dorothyann BIRCH, MD Taking Active Self  brimonidine -timolol  (COMBIGAN ) 0.2-0.5 % ophthalmic solution [id number] No Place 1 drop into the right eye every 12 (twelve) hours. [provider] Taking Active   diphenoxylate -atropine  (LOMOTIL ) 2.5-0.025 MG tablet [id number]  Take 1 tablet by mouth 2 (two) times daily as  needed for diarrhea or loose stools. Alvan Dorothyann BIRCH, MD  Active   fluticasone  (FLONASE ) 50 MCG/ACT nasal spray [id number] No SPRAY 2 SPRAYS INTO EACH NOSTRIL EVERY DAY Breeback, Jade L, PA-C Taking Active Self  gabapentin  (NEURONTIN ) 600 MG tablet [id number] No TAKE 2 TABLETS (1,200 MG TOTAL) BY MOUTH AT BEDTIME. PATIENT TAKES 1200 MG DAILY Alvan Dorothyann BIRCH, MD Taking Active Self           Med Note HASSEL, [facility]   Mon Jan 18, 2024 10:43 AM) Per recent hospital records, patient's dose was changed to 600mg  PO at HS  ipratropium (ATROVENT ) 0.06 % nasal spray [id number] No Place 2 sprays into both nostrils 3 (three) times daily. Alvan Dorothyann BIRCH, MD Taking Active Self           Med Note KARLYNN HEDDY HERO   Tue Sep 22, 2023  1:47 PM) Per patient Uses as needed  ipratropium-albuterol  (DUONEB) 0.5-2.5 (3) MG/3ML SOLN [id number] No INHALE 3 MLS INTO THE LUNGS EVERY 2 (TWO) HOURS AS NEEDED (WHEEZING OR SHORTNESS OF BREATH). Alvan Dorothyann BIRCH, MD Taking Active Self  levothyroxine  (SYNTHROID ) 88 MCG tablet [id number] No TAKE 1 TABLET BY MOUTH EVERY DAY Alvan Dorothyann BIRCH, MD Taking Active Self  losartan  (COZAAR ) 100 MG tablet [id number] No TAKE 1 TABLET BY MOUTH EVERY DAY Alvan Dorothyann BIRCH, MD Taking Active Self  ondansetron  (ZOFRAN ) 8 MG tablet [id number] No Take 1 tablet (8 mg total) by mouth every 8 (eight) hours as needed for nausea or vomiting. Evonnie Lenis, MD Taking Active Self  pantoprazole  (PROTONIX ) 40 MG tablet [id number] No TAKE 1 TABLET BY MOUTH EVERY DAY Alvan Dorothyann BIRCH, MD Taking Active Self  rosuvastatin  (CRESTOR ) 20 MG tablet [id number] No Take 1 tablet (20 mg total) by mouth  at bedtime. Alvan Dorothyann BIRCH, MD Taking Active Self  tiZANidine  (ZANAFLEX ) 2 MG tablet [id number]  Take 1 tablet (2 mg total) by mouth 2 (two) times daily as needed for muscle spasms. Use sparingly. Alvan Dorothyann BIRCH, MD  Active   traZODone  (DESYREL ) 50 MG tablet [id number] No Take 50 mg by  mouth at bedtime as needed for sleep. Euna Read, MD 12/28/2023 Active Self           Med Note KARLYNN HEDDY HERO   Tue Sep 22, 2023  1:44 PM) Reports takes 150 mg every night  TRELEGY ELLIPTA  100-62.5-25 MCG/ACT AEPB [id number] No INHALE 1 PUFF BY MOUTH EVERY DAY Alvan Dorothyann BIRCH, MD Unknown Active Self  Vilazodone  HCl 20 MG TABS [id number] No Take 1 tablet (20 mg total) by mouth daily. Maree Bracken D, DO Taking Active             Home Care and Equipment/Supplies: Were Home Health Services Ordered?: Yes [facility] of [facility] Agency[facility] Has Agency set up a time to come to your home?: Yes First Home Health Visit Date: 01/27/24 Any new equipment or medical supplies ordered?: Yes Name of Medical supply agency?: unknown Were you able to get the equipment/medical supplies?: Yes Do you have any questions related to the use of the equipment/supplies?: No  Functional Questionnaire: Do you need assistance with bathing/showering or dressing?: Yes Do you need assistance with meal preparation?: Yes Do you need assistance with eating?: No Do you have difficulty maintaining continence: No Do you need assistance with getting out of bed/getting out of a chair/moving?: Yes Do you have difficulty managing or taking your medications?: No  Follow up appointments reviewed: PCP Follow-up appointment confirmed?: No (already seen) MD Provider Line Number:[phone] Given: No Date of PCP follow-up appointment?:  (already seen) Do you need transportation to your follow-up appointment?: No Do you understand care options if your condition(s) worsen?: Yes-patient verbalized understanding    SIGNATURE Julian Lemmings, LPN [facility] Nurse Health Advisor Direct Dial [phone]

## 2024-02-01 DIAGNOSIS — E785 Hyperlipidemia, unspecified: Secondary | ICD-10-CM | POA: Diagnosis not present

## 2024-02-01 DIAGNOSIS — J9622 Acute and chronic respiratory failure with hypercapnia: Secondary | ICD-10-CM | POA: Diagnosis not present

## 2024-02-01 DIAGNOSIS — E559 Vitamin D deficiency, unspecified: Secondary | ICD-10-CM | POA: Diagnosis not present

## 2024-02-01 DIAGNOSIS — J9621 Acute and chronic respiratory failure with hypoxia: Secondary | ICD-10-CM | POA: Diagnosis not present

## 2024-02-01 DIAGNOSIS — M48061 Spinal stenosis, lumbar region without neurogenic claudication: Secondary | ICD-10-CM | POA: Diagnosis not present

## 2024-02-01 DIAGNOSIS — I120 Hypertensive chronic kidney disease with stage 5 chronic kidney disease or end stage renal disease: Secondary | ICD-10-CM | POA: Diagnosis not present

## 2024-02-01 DIAGNOSIS — N185 Chronic kidney disease, stage 5: Secondary | ICD-10-CM | POA: Diagnosis not present

## 2024-02-01 DIAGNOSIS — J440 Chronic obstructive pulmonary disease with acute lower respiratory infection: Secondary | ICD-10-CM | POA: Diagnosis not present

## 2024-02-01 DIAGNOSIS — E1122 Type 2 diabetes mellitus with diabetic chronic kidney disease: Secondary | ICD-10-CM | POA: Diagnosis not present

## 2024-02-01 DIAGNOSIS — N179 Acute kidney failure, unspecified: Secondary | ICD-10-CM | POA: Diagnosis not present

## 2024-02-01 DIAGNOSIS — E46 Unspecified protein-calorie malnutrition: Secondary | ICD-10-CM | POA: Diagnosis not present

## 2024-02-01 DIAGNOSIS — G894 Chronic pain syndrome: Secondary | ICD-10-CM | POA: Diagnosis not present

## 2024-02-01 DIAGNOSIS — E8809 Other disorders of plasma-protein metabolism, not elsewhere classified: Secondary | ICD-10-CM | POA: Diagnosis not present

## 2024-02-01 DIAGNOSIS — J441 Chronic obstructive pulmonary disease with (acute) exacerbation: Secondary | ICD-10-CM | POA: Diagnosis not present

## 2024-02-01 DIAGNOSIS — G8929 Other chronic pain: Secondary | ICD-10-CM | POA: Diagnosis not present

## 2024-02-01 DIAGNOSIS — M51362 Other intervertebral disc degeneration, lumbar region with discogenic back pain and lower extremity pain: Secondary | ICD-10-CM | POA: Diagnosis not present

## 2024-02-01 DIAGNOSIS — E538 Deficiency of other specified B group vitamins: Secondary | ICD-10-CM | POA: Diagnosis not present

## 2024-02-01 DIAGNOSIS — M5416 Radiculopathy, lumbar region: Secondary | ICD-10-CM | POA: Diagnosis not present

## 2024-02-01 DIAGNOSIS — L89890 Pressure ulcer of other site, unstageable: Secondary | ICD-10-CM | POA: Diagnosis not present

## 2024-02-01 DIAGNOSIS — E039 Hypothyroidism, unspecified: Secondary | ICD-10-CM | POA: Diagnosis not present

## 2024-02-01 DIAGNOSIS — A4102 Sepsis due to Methicillin resistant Staphylococcus aureus: Secondary | ICD-10-CM | POA: Diagnosis not present

## 2024-02-01 DIAGNOSIS — E1142 Type 2 diabetes mellitus with diabetic polyneuropathy: Secondary | ICD-10-CM | POA: Diagnosis not present

## 2024-02-01 DIAGNOSIS — M51369 Other intervertebral disc degeneration, lumbar region without mention of lumbar back pain or lower extremity pain: Secondary | ICD-10-CM | POA: Diagnosis not present

## 2024-02-01 NOTE — Telephone Encounter (Signed)
 Patient was suppose to be on one more week of abx but has been over a week - states needing an abx for MRSA - ASAP has not had one in over a week  Portable oxygen  out of [place] of [place] has not heard anything from anyone regarding  Wound on arm ( injured while in hospital - started small and has become a large wound- around the size - larger than 50 cent piece - scab is starting to come lose  is only slightly better. MRSA was in blood stream per patient.  [facility] nurse - [phone]- came out last week -but  was suppose to call Sunday night to set up this week's appt but she has not heard from anyone  No one has came out to help with bathing  PT, social worker , someone to help bathing - once seen only - Donella Fulling came out week before last week - did an assessment .  Monday [facility] PT came out  Tuesday emily morgan OT came out  Cheryll Corti- social worker came on Wednesday  Bed bath given Wednesday of last week   Does patient need a Mychart video visit to discuss all of this with provider -?

## 2024-02-03 DIAGNOSIS — E8809 Other disorders of plasma-protein metabolism, not elsewhere classified: Secondary | ICD-10-CM | POA: Diagnosis not present

## 2024-02-03 DIAGNOSIS — E1122 Type 2 diabetes mellitus with diabetic chronic kidney disease: Secondary | ICD-10-CM | POA: Diagnosis not present

## 2024-02-03 DIAGNOSIS — E46 Unspecified protein-calorie malnutrition: Secondary | ICD-10-CM | POA: Diagnosis not present

## 2024-02-03 DIAGNOSIS — G8929 Other chronic pain: Secondary | ICD-10-CM | POA: Diagnosis not present

## 2024-02-03 DIAGNOSIS — E538 Deficiency of other specified B group vitamins: Secondary | ICD-10-CM | POA: Diagnosis not present

## 2024-02-03 DIAGNOSIS — N179 Acute kidney failure, unspecified: Secondary | ICD-10-CM | POA: Diagnosis not present

## 2024-02-03 DIAGNOSIS — I120 Hypertensive chronic kidney disease with stage 5 chronic kidney disease or end stage renal disease: Secondary | ICD-10-CM | POA: Diagnosis not present

## 2024-02-03 DIAGNOSIS — E1142 Type 2 diabetes mellitus with diabetic polyneuropathy: Secondary | ICD-10-CM | POA: Diagnosis not present

## 2024-02-03 DIAGNOSIS — J9621 Acute and chronic respiratory failure with hypoxia: Secondary | ICD-10-CM | POA: Diagnosis not present

## 2024-02-03 DIAGNOSIS — J441 Chronic obstructive pulmonary disease with (acute) exacerbation: Secondary | ICD-10-CM | POA: Diagnosis not present

## 2024-02-03 DIAGNOSIS — A4102 Sepsis due to Methicillin resistant Staphylococcus aureus: Secondary | ICD-10-CM | POA: Diagnosis not present

## 2024-02-03 DIAGNOSIS — J9622 Acute and chronic respiratory failure with hypercapnia: Secondary | ICD-10-CM | POA: Diagnosis not present

## 2024-02-03 DIAGNOSIS — M51369 Other intervertebral disc degeneration, lumbar region without mention of lumbar back pain or lower extremity pain: Secondary | ICD-10-CM | POA: Diagnosis not present

## 2024-02-03 DIAGNOSIS — E039 Hypothyroidism, unspecified: Secondary | ICD-10-CM | POA: Diagnosis not present

## 2024-02-03 DIAGNOSIS — E785 Hyperlipidemia, unspecified: Secondary | ICD-10-CM | POA: Diagnosis not present

## 2024-02-03 DIAGNOSIS — L89890 Pressure ulcer of other site, unstageable: Secondary | ICD-10-CM | POA: Diagnosis not present

## 2024-02-03 DIAGNOSIS — J440 Chronic obstructive pulmonary disease with acute lower respiratory infection: Secondary | ICD-10-CM | POA: Diagnosis not present

## 2024-02-03 DIAGNOSIS — E559 Vitamin D deficiency, unspecified: Secondary | ICD-10-CM | POA: Diagnosis not present

## 2024-02-03 DIAGNOSIS — M48061 Spinal stenosis, lumbar region without neurogenic claudication: Secondary | ICD-10-CM | POA: Diagnosis not present

## 2024-02-03 DIAGNOSIS — N185 Chronic kidney disease, stage 5: Secondary | ICD-10-CM | POA: Diagnosis not present

## 2024-02-03 NOTE — Telephone Encounter (Signed)
Please let her know I think we are OK.  I went back and looked at her repeat blood culture on the 2nd hospital admit and they came back negative which is awesome.  I think if she was able to get in a week of the oral , then we are OK.   As far as would we can call [facility] and see if they have a wound care nurse to some in and look at the wound.    See if she ever heard from [facility]

## 2024-02-03 NOTE — Telephone Encounter (Signed)
Patient wanted to know if the blood culture that was negative was from [facility]. She states she was admitted 3 times. She is happy that the blood culture was negative but questioned why she had been given IV abx in hospital if this was the case.   She states that she has a virtual appt with [name]  on Thursday 02/04/24 and will discuss the wound  home health if needed with her at that time .she states the site is smaller and starting to heal somewhat.    She had someone come out and give her a bath today . She is going to call PT as they were due to come on Monday but has not come so far.

## 2024-02-04 ENCOUNTER — Telehealth (INDEPENDENT_AMBULATORY_CARE_PROVIDER_SITE_OTHER): Payer: 59 | Admitting: Family Medicine

## 2024-02-04 DIAGNOSIS — A4102 Sepsis due to Methicillin resistant Staphylococcus aureus: Secondary | ICD-10-CM

## 2024-02-04 DIAGNOSIS — R197 Diarrhea, unspecified: Secondary | ICD-10-CM

## 2024-02-04 DIAGNOSIS — I10 Essential (primary) hypertension: Secondary | ICD-10-CM | POA: Diagnosis not present

## 2024-02-04 DIAGNOSIS — R110 Nausea: Secondary | ICD-10-CM

## 2024-02-04 DIAGNOSIS — J441 Chronic obstructive pulmonary disease with (acute) exacerbation: Secondary | ICD-10-CM | POA: Diagnosis not present

## 2024-02-04 DIAGNOSIS — F3132 Bipolar disorder, current episode depressed, moderate: Secondary | ICD-10-CM

## 2024-02-04 DIAGNOSIS — J4489 Other specified chronic obstructive pulmonary disease: Secondary | ICD-10-CM | POA: Diagnosis not present

## 2024-02-04 MED ORDER — AMBULATORY NON FORMULARY MEDICATION: 0 refills | Status: AC

## 2024-02-04 NOTE — Progress Notes (Unsigned)
Virtual Visit via Video Note  I connected with Krystal Burgess on 02/05/24 at  1:40 PM EST by a video enabled telemedicine application and verified that I am speaking with the correct person using two identifiers.   I discussed the limitations of evaluation and management by telemedicine and the availability of in person appointments. The patient expressed understanding and agreed to proceed.  Patient location: at home Provider location: in office  Subjective:    CC:  No chief complaint on file.   HPI:  He does have several concerns that she would like to address today.  She needs a prescription sent to [facility] for portable oxygen concentrator.  She said that she did end up discontinuing her antibiotics about a week and ahead of time for MRSA because of severe diarrhea even with taking the Lomotil.  Since being off of it her diarrhea is much better and she is feeling a little better.  Also has a wound on her right upper arm she hit it on the side table and says initially it was just a scratch.  The home health company had recommended a bandage that she put over it but by the time she took it off several days later the wound actually looked worse.  More recently she is just been applying Aquaphor and putting a Band-Aid over it and actually seems to be getting better on its own.  She is then called the court in regards to some complaints that her daughter has filed and needs a letter saying that she is currently homebound since she is truly unable to walk right now she is getting some OT and PT but has not been able to be mobile yet.  Obstructive sleep apnea-she is wearing her BiPAP and that seems to be working really well.  Depression-she has checked with her insurance and they do offer therapy/counseling online.  She just has to set up an account but says she is really interested in doing so.  She is also like to get an order for some up-to-date blood work.  She did restart her  furosemide she has been taking it on average every other day.  She did follow-up with pain management virtually and they have put her on a buprenorphine patch 20 mg.  She has not heard back from Dr. Quintella Reichert office in regards to her alprazolam she has been taking 1 tab at bedtime and 1 tab in the morning as needed.    Past medical history, Surgical history, Family history not pertinant except as noted below, Social history, Allergies, and medications have been entered into the medical record, reviewed, and corrections made.    Objective:    General: Speaking clearly in complete sentences without any shortness of breath.  Alert and oriented x3.  Normal judgment. No apparent acute distress.    Impression and Recommendations:    Problem List Items Addressed This Visit       Cardiovascular and Mediastinum   Essential hypertension - Primary   She has restarted her Lasix but is taking it about every other day.  Considering that she has been having pretty rampant diarrhea I am a little nervous that she may be dehydrated and may be impacting her renal functions.  Fortunately the diarrhea seems to be improving and I encouraged her not to take the Lasix daily will have a BMP drawn through home health to see if renal function is stable.  The furosemide was actually stopped while she was in  the hospital but she restarted it on her own.      Relevant Medications   furosemide (LASIX) 20 MG tablet   Other Relevant Orders   CBC with Differential/Platelet   CMP14+EGFR   TSH   Urinalysis, Routine w reflex microscopic   Blood culture (routine single)     Respiratory   COPD exacerbation (HCC)   Relevant Medications   AMBULATORY NON FORMULARY MEDICATION   levocetirizine (XYZAL) 5 MG tablet   predniSONE (DELTASONE) 10 MG tablet   promethazine (PHENERGAN) 25 MG tablet   Other Relevant Orders   CBC with Differential/Platelet   CMP14+EGFR   TSH   Urinalysis, Routine w reflex microscopic    Blood culture (routine single)   COPD (chronic obstructive pulmonary disease) with chronic bronchitis (HCC)   Relevant Medications   levocetirizine (XYZAL) 5 MG tablet   predniSONE (DELTASONE) 10 MG tablet   promethazine (PHENERGAN) 25 MG tablet     Other   Nausea   Is a lot of chronic nausea and I do feel like it actually has been better since she has been taking less NSAIDs but did restart her diclofenac recently for pain.  I just remind her to her that this can cause gastritis and gastric ulcers and so she really needs to use it sparingly and not routinely it can also be very hard on kidney function.      Bipolar disorder (HCC)   She was previously prescribed alprazolam by Dr. Erma Pinto.  And is asking for refills today but I reminded her that I do not write this medication for her and with her chronic narcotics she really should be not really on a benzodiazepine.  But at least should be using it very sparingly at this point.  Reach out to Dr.Runheims office in this regard.  That she has tried to get in touch with them and they want her to come in for an in person appointment but right now she is technically homebound.      Other Visit Diagnoses       Diarrhea, unspecified type       Relevant Orders   CBC with Differential/Platelet   CMP14+EGFR   TSH   Urinalysis, Routine w reflex microscopic   Blood culture (routine single)     MRSA (methicillin resistant Staphylococcus aureus) septicemia (HCC)       Relevant Orders   CBC with Differential/Platelet   CMP14+EGFR   TSH   Urinalysis, Routine w reflex microscopic   Blood culture (routine single)       Diarrhea-seems to have improved after being off the antibiotics for several days.  She still technically is about 7 days short of her full treatment.  But she is actually doing really well and her repeat cultures were negative so I think were okay to hold off on trying to transition to IV antibiotics.  Will monitor carefully and we  will try to get a CBC as well as a blood culture through home health.    Orders Placed This Encounter  Procedures   Blood culture (routine single)   CBC with Differential/Platelet   CMP14+EGFR   TSH   Urinalysis, Routine w reflex microscopic    Meds ordered this encounter  Medications   AMBULATORY NON FORMULARY MEDICATION    Sig: Battery powered portable oxygen concentrator    Dispense:  1 each    Refill:  0     I discussed the assessment and treatment plan with the patient. The  patient was provided an opportunity to ask questions and all were answered. The patient agreed with the plan and demonstrated an understanding of the instructions.   The patient was advised to call back or seek an in-person evaluation if the symptoms worsen or if the condition fails to improve as anticipated.  I spent 35 minutes on the day of the encounter to include pre-visit record review, face-to-face time with the patient and post visit ordering of test.   Nani Gasser, MD

## 2024-02-04 NOTE — Progress Notes (Unsigned)
Pt reports that she only has her inhaler and breathing pills.   She states that she is unable to walk and she HASN'T scheduled an appointment for pulmonology because she cannot take a bath to go anywhere.

## 2024-02-05 ENCOUNTER — Telehealth: Payer: Self-pay | Admitting: Family Medicine

## 2024-02-05 ENCOUNTER — Encounter: Payer: Self-pay | Admitting: Family Medicine

## 2024-02-05 NOTE — Assessment & Plan Note (Signed)
She has restarted her Lasix but is taking it about every other day.  Considering that she has been having pretty rampant diarrhea I am a little nervous that she may be dehydrated and may be impacting her renal functions.  Fortunately the diarrhea seems to be improving and I encouraged her not to take the Lasix daily will have a BMP drawn through home health to see if renal function is stable.  The furosemide was actually stopped while she was in the hospital but she restarted it on her own.

## 2024-02-05 NOTE — Assessment & Plan Note (Signed)
She was previously prescribed alprazolam by Dr. Erma Pinto.  And is asking for refills today but I reminded her that I do not write this medication for her and with her chronic narcotics she really should be not really on a benzodiazepine.  But at least should be using it very sparingly at this point.  Reach out to Dr.Runheims office in this regard.  That she has tried to get in touch with them and they want her to come in for an in person appointment but right now she is technically homebound.

## 2024-02-05 NOTE — Telephone Encounter (Signed)
Pleaes call Dr. Shonna Chock office.  He normally prescribes her alprazolam.  More recently she has been taking it once a day.  She had asked me for refill.  But I would like to call his office and see if they are willing to continue her prescription even if it is at a reduced dose which I think would be good since she is also under chronic pain management.  And right now she is only using it once a day.  If they are not going to continue her medication I can take over but that is not what I would prefer since I am not the one that is been managing this for the last year.

## 2024-02-05 NOTE — Assessment & Plan Note (Signed)
Is a lot of chronic nausea and I do feel like it actually has been better since she has been taking less NSAIDs but did restart her diclofenac recently for pain.  I just remind her to her that this can cause gastritis and gastric ulcers and so she really needs to use it sparingly and not routinely it can also be very hard on kidney function.

## 2024-02-08 DIAGNOSIS — G8929 Other chronic pain: Secondary | ICD-10-CM | POA: Diagnosis not present

## 2024-02-08 DIAGNOSIS — E559 Vitamin D deficiency, unspecified: Secondary | ICD-10-CM | POA: Diagnosis not present

## 2024-02-08 DIAGNOSIS — E1142 Type 2 diabetes mellitus with diabetic polyneuropathy: Secondary | ICD-10-CM | POA: Diagnosis not present

## 2024-02-08 DIAGNOSIS — A4102 Sepsis due to Methicillin resistant Staphylococcus aureus: Secondary | ICD-10-CM | POA: Diagnosis not present

## 2024-02-08 DIAGNOSIS — E039 Hypothyroidism, unspecified: Secondary | ICD-10-CM | POA: Diagnosis not present

## 2024-02-08 DIAGNOSIS — E46 Unspecified protein-calorie malnutrition: Secondary | ICD-10-CM | POA: Diagnosis not present

## 2024-02-08 DIAGNOSIS — E8809 Other disorders of plasma-protein metabolism, not elsewhere classified: Secondary | ICD-10-CM | POA: Diagnosis not present

## 2024-02-08 DIAGNOSIS — I120 Hypertensive chronic kidney disease with stage 5 chronic kidney disease or end stage renal disease: Secondary | ICD-10-CM | POA: Diagnosis not present

## 2024-02-08 DIAGNOSIS — M48061 Spinal stenosis, lumbar region without neurogenic claudication: Secondary | ICD-10-CM | POA: Diagnosis not present

## 2024-02-08 DIAGNOSIS — L89890 Pressure ulcer of other site, unstageable: Secondary | ICD-10-CM | POA: Diagnosis not present

## 2024-02-08 DIAGNOSIS — E785 Hyperlipidemia, unspecified: Secondary | ICD-10-CM | POA: Diagnosis not present

## 2024-02-08 DIAGNOSIS — M51369 Other intervertebral disc degeneration, lumbar region without mention of lumbar back pain or lower extremity pain: Secondary | ICD-10-CM | POA: Diagnosis not present

## 2024-02-08 DIAGNOSIS — J9622 Acute and chronic respiratory failure with hypercapnia: Secondary | ICD-10-CM | POA: Diagnosis not present

## 2024-02-08 DIAGNOSIS — N185 Chronic kidney disease, stage 5: Secondary | ICD-10-CM | POA: Diagnosis not present

## 2024-02-08 DIAGNOSIS — J440 Chronic obstructive pulmonary disease with acute lower respiratory infection: Secondary | ICD-10-CM | POA: Diagnosis not present

## 2024-02-08 DIAGNOSIS — N179 Acute kidney failure, unspecified: Secondary | ICD-10-CM | POA: Diagnosis not present

## 2024-02-08 DIAGNOSIS — J9621 Acute and chronic respiratory failure with hypoxia: Secondary | ICD-10-CM | POA: Diagnosis not present

## 2024-02-08 DIAGNOSIS — E1122 Type 2 diabetes mellitus with diabetic chronic kidney disease: Secondary | ICD-10-CM | POA: Diagnosis not present

## 2024-02-08 DIAGNOSIS — E538 Deficiency of other specified B group vitamins: Secondary | ICD-10-CM | POA: Diagnosis not present

## 2024-02-08 DIAGNOSIS — J441 Chronic obstructive pulmonary disease with (acute) exacerbation: Secondary | ICD-10-CM | POA: Diagnosis not present

## 2024-02-09 DIAGNOSIS — M7912 Myalgia of auxiliary muscles, head and neck: Secondary | ICD-10-CM | POA: Diagnosis not present

## 2024-02-11 ENCOUNTER — Telehealth: Payer: Self-pay | Admitting: Family Medicine

## 2024-02-11 ENCOUNTER — Encounter: Payer: Self-pay | Admitting: *Deleted

## 2024-02-11 DIAGNOSIS — N179 Acute kidney failure, unspecified: Secondary | ICD-10-CM | POA: Diagnosis not present

## 2024-02-11 DIAGNOSIS — I10 Essential (primary) hypertension: Secondary | ICD-10-CM | POA: Diagnosis not present

## 2024-02-11 DIAGNOSIS — A4102 Sepsis due to Methicillin resistant Staphylococcus aureus: Secondary | ICD-10-CM | POA: Diagnosis not present

## 2024-02-11 DIAGNOSIS — G8929 Other chronic pain: Secondary | ICD-10-CM | POA: Diagnosis not present

## 2024-02-11 DIAGNOSIS — E46 Unspecified protein-calorie malnutrition: Secondary | ICD-10-CM | POA: Diagnosis not present

## 2024-02-11 DIAGNOSIS — J9622 Acute and chronic respiratory failure with hypercapnia: Secondary | ICD-10-CM | POA: Diagnosis not present

## 2024-02-11 DIAGNOSIS — R198 Other specified symptoms and signs involving the digestive system and abdomen: Secondary | ICD-10-CM | POA: Diagnosis not present

## 2024-02-11 DIAGNOSIS — E039 Hypothyroidism, unspecified: Secondary | ICD-10-CM | POA: Diagnosis not present

## 2024-02-11 DIAGNOSIS — I120 Hypertensive chronic kidney disease with stage 5 chronic kidney disease or end stage renal disease: Secondary | ICD-10-CM | POA: Diagnosis not present

## 2024-02-11 DIAGNOSIS — L89890 Pressure ulcer of other site, unstageable: Secondary | ICD-10-CM | POA: Diagnosis not present

## 2024-02-11 DIAGNOSIS — E1142 Type 2 diabetes mellitus with diabetic polyneuropathy: Secondary | ICD-10-CM | POA: Diagnosis not present

## 2024-02-11 DIAGNOSIS — J441 Chronic obstructive pulmonary disease with (acute) exacerbation: Secondary | ICD-10-CM | POA: Diagnosis not present

## 2024-02-11 DIAGNOSIS — E1122 Type 2 diabetes mellitus with diabetic chronic kidney disease: Secondary | ICD-10-CM | POA: Diagnosis not present

## 2024-02-11 DIAGNOSIS — E559 Vitamin D deficiency, unspecified: Secondary | ICD-10-CM | POA: Diagnosis not present

## 2024-02-11 DIAGNOSIS — N185 Chronic kidney disease, stage 5: Secondary | ICD-10-CM | POA: Diagnosis not present

## 2024-02-11 DIAGNOSIS — E8809 Other disorders of plasma-protein metabolism, not elsewhere classified: Secondary | ICD-10-CM | POA: Diagnosis not present

## 2024-02-11 DIAGNOSIS — J440 Chronic obstructive pulmonary disease with acute lower respiratory infection: Secondary | ICD-10-CM | POA: Diagnosis not present

## 2024-02-11 DIAGNOSIS — E785 Hyperlipidemia, unspecified: Secondary | ICD-10-CM | POA: Diagnosis not present

## 2024-02-11 DIAGNOSIS — M51369 Other intervertebral disc degeneration, lumbar region without mention of lumbar back pain or lower extremity pain: Secondary | ICD-10-CM | POA: Diagnosis not present

## 2024-02-11 DIAGNOSIS — M48061 Spinal stenosis, lumbar region without neurogenic claudication: Secondary | ICD-10-CM | POA: Diagnosis not present

## 2024-02-11 DIAGNOSIS — E538 Deficiency of other specified B group vitamins: Secondary | ICD-10-CM | POA: Diagnosis not present

## 2024-02-11 DIAGNOSIS — J9621 Acute and chronic respiratory failure with hypoxia: Secondary | ICD-10-CM | POA: Diagnosis not present

## 2024-02-11 NOTE — Telephone Encounter (Signed)
Order for portable O2 sent will fax again [phone]

## 2024-02-11 NOTE — Telephone Encounter (Signed)
Copied from CRM [id number]. Topic: Clinical - Home Health Verbal Orders >> Feb 11, 2024  3:28 PM Franchot Heidelberg wrote: Caller/Agency: [name], from [facility]  Callback Number: [phone] Service Requested: Skilled Nursing Frequency:  1w6 2 PRN  Any new concerns about the patient? No.   She does have a wound on her right arm,   [name] needs to confirm if patient is advised to put aquafor on it per her PCP.

## 2024-02-11 NOTE — Telephone Encounter (Signed)
I tried to call the office. They are not open today. I will try again tomorrow.

## 2024-02-11 NOTE — Telephone Encounter (Signed)
Copied from CRM [id number]. Topic: Clinical - Medical Advice >> Feb 09, 2024 11:34 AM Dennison Nancy wrote: Reason for CRM: requesting to a order a portable oxgen concentrator due to having breathing issue have to use the oxgen 24 hours  Please followup with patient at [phone]  to let know the status

## 2024-02-11 NOTE — Telephone Encounter (Signed)
Letter stating she is homebound.

## 2024-02-12 ENCOUNTER — Ambulatory Visit: Payer: Self-pay

## 2024-02-12 NOTE — Telephone Encounter (Signed)
 Copied from CRM [id number]. Topic: Clinical - Request for Lab/Test Order >> Feb 11, 2024  9:40 AM Shon Hale wrote: Reason for CRM: [name] with [facility] states have an order to do a blood draw, however they do not have the required tubes in stock for the draw.   [name] called lab and was told it is a special bottle for that test.  [name] inquiring on what needs to be done.   Best callback number: [phone]

## 2024-02-14 ENCOUNTER — Other Ambulatory Visit: Payer: Self-pay | Admitting: Family Medicine

## 2024-02-15 DIAGNOSIS — J9620 Acute and chronic respiratory failure, unspecified whether with hypoxia or hypercapnia: Secondary | ICD-10-CM | POA: Diagnosis not present

## 2024-02-15 DIAGNOSIS — G8929 Other chronic pain: Secondary | ICD-10-CM | POA: Diagnosis not present

## 2024-02-15 DIAGNOSIS — E559 Vitamin D deficiency, unspecified: Secondary | ICD-10-CM | POA: Diagnosis not present

## 2024-02-15 DIAGNOSIS — N179 Acute kidney failure, unspecified: Secondary | ICD-10-CM | POA: Diagnosis not present

## 2024-02-15 DIAGNOSIS — E1142 Type 2 diabetes mellitus with diabetic polyneuropathy: Secondary | ICD-10-CM | POA: Diagnosis not present

## 2024-02-15 DIAGNOSIS — E8809 Other disorders of plasma-protein metabolism, not elsewhere classified: Secondary | ICD-10-CM | POA: Diagnosis not present

## 2024-02-15 DIAGNOSIS — M51369 Other intervertebral disc degeneration, lumbar region without mention of lumbar back pain or lower extremity pain: Secondary | ICD-10-CM | POA: Diagnosis not present

## 2024-02-15 DIAGNOSIS — J9622 Acute and chronic respiratory failure with hypercapnia: Secondary | ICD-10-CM | POA: Diagnosis not present

## 2024-02-15 DIAGNOSIS — J441 Chronic obstructive pulmonary disease with (acute) exacerbation: Secondary | ICD-10-CM | POA: Diagnosis not present

## 2024-02-15 DIAGNOSIS — I120 Hypertensive chronic kidney disease with stage 5 chronic kidney disease or end stage renal disease: Secondary | ICD-10-CM | POA: Diagnosis not present

## 2024-02-15 DIAGNOSIS — J440 Chronic obstructive pulmonary disease with acute lower respiratory infection: Secondary | ICD-10-CM | POA: Diagnosis not present

## 2024-02-15 DIAGNOSIS — E785 Hyperlipidemia, unspecified: Secondary | ICD-10-CM | POA: Diagnosis not present

## 2024-02-15 DIAGNOSIS — M48061 Spinal stenosis, lumbar region without neurogenic claudication: Secondary | ICD-10-CM | POA: Diagnosis not present

## 2024-02-15 DIAGNOSIS — L89890 Pressure ulcer of other site, unstageable: Secondary | ICD-10-CM | POA: Diagnosis not present

## 2024-02-15 DIAGNOSIS — J449 Chronic obstructive pulmonary disease, unspecified: Secondary | ICD-10-CM | POA: Diagnosis not present

## 2024-02-15 DIAGNOSIS — E538 Deficiency of other specified B group vitamins: Secondary | ICD-10-CM | POA: Diagnosis not present

## 2024-02-15 DIAGNOSIS — A4102 Sepsis due to Methicillin resistant Staphylococcus aureus: Secondary | ICD-10-CM | POA: Diagnosis not present

## 2024-02-15 DIAGNOSIS — N185 Chronic kidney disease, stage 5: Secondary | ICD-10-CM | POA: Diagnosis not present

## 2024-02-15 DIAGNOSIS — E039 Hypothyroidism, unspecified: Secondary | ICD-10-CM | POA: Diagnosis not present

## 2024-02-15 DIAGNOSIS — E1122 Type 2 diabetes mellitus with diabetic chronic kidney disease: Secondary | ICD-10-CM | POA: Diagnosis not present

## 2024-02-15 DIAGNOSIS — E46 Unspecified protein-calorie malnutrition: Secondary | ICD-10-CM | POA: Diagnosis not present

## 2024-02-15 DIAGNOSIS — J9621 Acute and chronic respiratory failure with hypoxia: Secondary | ICD-10-CM | POA: Diagnosis not present

## 2024-02-16 ENCOUNTER — Encounter: Payer: Self-pay | Admitting: Family Medicine

## 2024-02-16 ENCOUNTER — Telehealth: Payer: Self-pay | Admitting: Family Medicine

## 2024-02-16 NOTE — Telephone Encounter (Signed)
 Patient advised of preliminary results.

## 2024-02-16 NOTE — Telephone Encounter (Signed)
 I just wanted her to know that the preliminary blood culture result was negative thus far.

## 2024-02-17 ENCOUNTER — Telehealth: Payer: Self-pay

## 2024-02-17 NOTE — Telephone Encounter (Signed)
 Yes, okay to apply Aquaphor.

## 2024-02-17 NOTE — Telephone Encounter (Signed)
 Copied from CRM [id number]. Topic: Clinical - Home Health Verbal Orders >> Feb 15, 2024  3:35 PM Thomes Dinning wrote:  Caller/Agency: Sedalia Muta, from [facility]  Callback Number: [phone]  Service Requested: Skilled Nursing  Frequency: 1w6 / 2 PRN  Any new concerns about the patient?Yes, She has a wound on her right arm, and nurse would like to know if applying aquaphor is ok  [name] needs to confirm if patient is advised to put aquafor on it per her PCP.

## 2024-02-18 DIAGNOSIS — E538 Deficiency of other specified B group vitamins: Secondary | ICD-10-CM | POA: Diagnosis not present

## 2024-02-18 DIAGNOSIS — M51369 Other intervertebral disc degeneration, lumbar region without mention of lumbar back pain or lower extremity pain: Secondary | ICD-10-CM | POA: Diagnosis not present

## 2024-02-18 DIAGNOSIS — E1122 Type 2 diabetes mellitus with diabetic chronic kidney disease: Secondary | ICD-10-CM | POA: Diagnosis not present

## 2024-02-18 DIAGNOSIS — G8929 Other chronic pain: Secondary | ICD-10-CM | POA: Diagnosis not present

## 2024-02-18 DIAGNOSIS — I120 Hypertensive chronic kidney disease with stage 5 chronic kidney disease or end stage renal disease: Secondary | ICD-10-CM | POA: Diagnosis not present

## 2024-02-18 DIAGNOSIS — E1142 Type 2 diabetes mellitus with diabetic polyneuropathy: Secondary | ICD-10-CM | POA: Diagnosis not present

## 2024-02-18 DIAGNOSIS — J440 Chronic obstructive pulmonary disease with acute lower respiratory infection: Secondary | ICD-10-CM | POA: Diagnosis not present

## 2024-02-18 DIAGNOSIS — J441 Chronic obstructive pulmonary disease with (acute) exacerbation: Secondary | ICD-10-CM | POA: Diagnosis not present

## 2024-02-18 DIAGNOSIS — E785 Hyperlipidemia, unspecified: Secondary | ICD-10-CM | POA: Diagnosis not present

## 2024-02-18 DIAGNOSIS — M48061 Spinal stenosis, lumbar region without neurogenic claudication: Secondary | ICD-10-CM | POA: Diagnosis not present

## 2024-02-18 DIAGNOSIS — E559 Vitamin D deficiency, unspecified: Secondary | ICD-10-CM | POA: Diagnosis not present

## 2024-02-18 DIAGNOSIS — E46 Unspecified protein-calorie malnutrition: Secondary | ICD-10-CM | POA: Diagnosis not present

## 2024-02-18 DIAGNOSIS — A4102 Sepsis due to Methicillin resistant Staphylococcus aureus: Secondary | ICD-10-CM | POA: Diagnosis not present

## 2024-02-18 DIAGNOSIS — E039 Hypothyroidism, unspecified: Secondary | ICD-10-CM | POA: Diagnosis not present

## 2024-02-18 DIAGNOSIS — N185 Chronic kidney disease, stage 5: Secondary | ICD-10-CM | POA: Diagnosis not present

## 2024-02-18 DIAGNOSIS — N179 Acute kidney failure, unspecified: Secondary | ICD-10-CM | POA: Diagnosis not present

## 2024-02-18 DIAGNOSIS — J9622 Acute and chronic respiratory failure with hypercapnia: Secondary | ICD-10-CM | POA: Diagnosis not present

## 2024-02-18 DIAGNOSIS — L89890 Pressure ulcer of other site, unstageable: Secondary | ICD-10-CM | POA: Diagnosis not present

## 2024-02-18 DIAGNOSIS — E8809 Other disorders of plasma-protein metabolism, not elsewhere classified: Secondary | ICD-10-CM | POA: Diagnosis not present

## 2024-02-18 DIAGNOSIS — J9621 Acute and chronic respiratory failure with hypoxia: Secondary | ICD-10-CM | POA: Diagnosis not present

## 2024-02-18 NOTE — Telephone Encounter (Signed)
 [name] with [facility] informed.  States that the blood culture did  not have enough blood to complete the test.  She is going to fax this over to [place] to review. No other blood work drawn except the blood culture per [name] with [facility].

## 2024-02-18 NOTE — Telephone Encounter (Signed)
 Okay to give verbal order to redraw.

## 2024-02-19 ENCOUNTER — Other Ambulatory Visit: Payer: Self-pay

## 2024-02-19 ENCOUNTER — Encounter: Payer: Self-pay | Admitting: Family Medicine

## 2024-02-19 DIAGNOSIS — A4102 Sepsis due to Methicillin resistant Staphylococcus aureus: Secondary | ICD-10-CM

## 2024-02-19 DIAGNOSIS — I10 Essential (primary) hypertension: Secondary | ICD-10-CM

## 2024-02-19 DIAGNOSIS — R197 Diarrhea, unspecified: Secondary | ICD-10-CM

## 2024-02-19 DIAGNOSIS — J441 Chronic obstructive pulmonary disease with (acute) exacerbation: Secondary | ICD-10-CM

## 2024-02-19 DIAGNOSIS — J449 Chronic obstructive pulmonary disease, unspecified: Secondary | ICD-10-CM | POA: Diagnosis not present

## 2024-02-19 NOTE — Telephone Encounter (Signed)
 Spoke with [name] - could not take as verbal order.  Resent lab order - faxed to [facility] health Fax# [phone]

## 2024-02-24 ENCOUNTER — Encounter: Payer: Self-pay | Admitting: Family Medicine

## 2024-02-24 ENCOUNTER — Telehealth: Admitting: Physician Assistant

## 2024-02-24 ENCOUNTER — Encounter: Payer: Self-pay | Admitting: Physician Assistant

## 2024-02-24 VITALS — BP 148/90

## 2024-02-24 DIAGNOSIS — J441 Chronic obstructive pulmonary disease with (acute) exacerbation: Secondary | ICD-10-CM | POA: Diagnosis not present

## 2024-02-24 DIAGNOSIS — N185 Chronic kidney disease, stage 5: Secondary | ICD-10-CM | POA: Diagnosis not present

## 2024-02-24 DIAGNOSIS — I120 Hypertensive chronic kidney disease with stage 5 chronic kidney disease or end stage renal disease: Secondary | ICD-10-CM | POA: Diagnosis not present

## 2024-02-24 DIAGNOSIS — E1142 Type 2 diabetes mellitus with diabetic polyneuropathy: Secondary | ICD-10-CM | POA: Diagnosis not present

## 2024-02-24 DIAGNOSIS — M51369 Other intervertebral disc degeneration, lumbar region without mention of lumbar back pain or lower extremity pain: Secondary | ICD-10-CM | POA: Diagnosis not present

## 2024-02-24 DIAGNOSIS — E538 Deficiency of other specified B group vitamins: Secondary | ICD-10-CM | POA: Diagnosis not present

## 2024-02-24 DIAGNOSIS — J440 Chronic obstructive pulmonary disease with acute lower respiratory infection: Secondary | ICD-10-CM | POA: Diagnosis not present

## 2024-02-24 DIAGNOSIS — I10 Essential (primary) hypertension: Secondary | ICD-10-CM | POA: Diagnosis not present

## 2024-02-24 DIAGNOSIS — J9621 Acute and chronic respiratory failure with hypoxia: Secondary | ICD-10-CM | POA: Diagnosis not present

## 2024-02-24 DIAGNOSIS — E039 Hypothyroidism, unspecified: Secondary | ICD-10-CM | POA: Diagnosis not present

## 2024-02-24 DIAGNOSIS — L89890 Pressure ulcer of other site, unstageable: Secondary | ICD-10-CM | POA: Diagnosis not present

## 2024-02-24 DIAGNOSIS — E8809 Other disorders of plasma-protein metabolism, not elsewhere classified: Secondary | ICD-10-CM | POA: Diagnosis not present

## 2024-02-24 DIAGNOSIS — J9611 Chronic respiratory failure with hypoxia: Secondary | ICD-10-CM

## 2024-02-24 DIAGNOSIS — G8929 Other chronic pain: Secondary | ICD-10-CM | POA: Diagnosis not present

## 2024-02-24 DIAGNOSIS — M48061 Spinal stenosis, lumbar region without neurogenic claudication: Secondary | ICD-10-CM | POA: Diagnosis not present

## 2024-02-24 DIAGNOSIS — N179 Acute kidney failure, unspecified: Secondary | ICD-10-CM | POA: Diagnosis not present

## 2024-02-24 DIAGNOSIS — E785 Hyperlipidemia, unspecified: Secondary | ICD-10-CM | POA: Diagnosis not present

## 2024-02-24 DIAGNOSIS — E46 Unspecified protein-calorie malnutrition: Secondary | ICD-10-CM | POA: Diagnosis not present

## 2024-02-24 DIAGNOSIS — J9622 Acute and chronic respiratory failure with hypercapnia: Secondary | ICD-10-CM | POA: Diagnosis not present

## 2024-02-24 DIAGNOSIS — E1122 Type 2 diabetes mellitus with diabetic chronic kidney disease: Secondary | ICD-10-CM | POA: Diagnosis not present

## 2024-02-24 DIAGNOSIS — J4489 Other specified chronic obstructive pulmonary disease: Secondary | ICD-10-CM

## 2024-02-24 DIAGNOSIS — E559 Vitamin D deficiency, unspecified: Secondary | ICD-10-CM | POA: Diagnosis not present

## 2024-02-24 DIAGNOSIS — A4102 Sepsis due to Methicillin resistant Staphylococcus aureus: Secondary | ICD-10-CM | POA: Diagnosis not present

## 2024-02-24 MED ORDER — PREDNISONE 10 MG PO TABS: ORAL_TABLET | ORAL | 0 refills | Status: DC

## 2024-02-24 MED ORDER — DOXYCYCLINE HYCLATE 100 MG PO TABS: 100.0000 mg | ORAL_TABLET | Freq: Two times a day (BID) | ORAL | 0 refills | Status: DC

## 2024-02-24 NOTE — Progress Notes (Signed)
 ..Virtual Visit via Video Note  I connected with Krystal Burgess on 02/28/24 at  1:20 PM EST by a video enabled telemedicine application and verified that I am speaking with the correct person using two identifiers.  Location: Patient: home Provider: clinic  .Marland KitchenParticipating in visit:  Patient: Krystal Burgess Provider: [name] PA-C   I discussed the limitations of evaluation and management by telemedicine and the availability of in person appointments. The patient expressed understanding and agreed to proceed.  History of Present Illness: Pt is a 63 yo obese female with Chronic respiratory failure on 3L of O2 with recent history of 2 hospital visits of acute on chronic respiratory failure due to COPD exacerbation in January. She would like to avoid hospital. Her last ED visit was 01/02/2024.  She was given antibiotics and prednisone. She has tapered own and decreased to the 10mg  dose for last 2-3 days. She is on Trelegy, flonase, duonebs, O2 during the day and BiPAP at night. She is having worsening productive cough and SOB. Her pulse ox at rest is staying in the upper 90s but drops with exertion.  .. Active Ambulatory Problems    Diagnosis Date Noted   Hypothyroidism 02/20/2009   Vitamin D deficiency 07/13/2009   Mixed hyperlipidemia 12/12/2008   Severe obesity (BMI >= 40) (HCC) 05/03/2009   Former smoker 12/12/2008   MDD (major depressive disorder), recurrent episode, severe (HCC) 12/12/2008   Mononeuritis 12/12/2008   Essential hypertension 03/26/2009   Allergic rhinitis 08/17/2009   Intrinsic asthma 12/12/2008   COPD (chronic obstructive pulmonary disease) with chronic bronchitis (HCC) 12/12/2008   Spinal stenosis of lumbar region 12/12/2008   LEG EDEMA, BILATERAL 03/26/2009   Vitamin B 12 deficiency 06/24/2011   Steroid-induced diabetes (HCC) 01/06/2013   Bipolar disorder (HCC) 01/24/2013   Bilateral carpal tunnel syndrome 03/14/2013   Acute angle-closure glaucoma 10/03/2013    Type 2 diabetes mellitus (HCC) 09/07/2014   Lumbar degenerative disc disease 08/14/2016   Pulmonary nodule, RML, needs noncontrast CT in February 2019 01/26/2017   Chronic insomnia 01/30/2017   Chronic ethmoidal sinusitis 01/29/2018   RLS (restless legs syndrome) 01/14/2019   Chronic respiratory failure with hypoxia (HCC) 10/23/2017   Stage 3b chronic kidney disease (CKD) (HCC) 10/26/2017   Combined forms of age-related cataract of 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   Cutaneous candidiasis 10/24/2017   Sinus tachycardia 04/08/2018   Posterior synechiae (iris), 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   Obesity, Class III, BMI 40-49.9 (morbid obesity) (HCC) 10/26/2017   Lower GI bleed 10/18/2014   Leukocytosis 10/08/2014   Hypomagnesemia 10/23/2017   Hypernatremia 04/08/2018   History of anxiety 10/08/2014   Glaucoma 10/23/2017   Gastroesophageal reflux disease without esophagitis 10/12/2014   Diabetic peripheral neuropathy associated with type 2 diabetes mellitus (HCC) 10/12/2014   Chronic back pain 10/24/2017   Acute on chronic respiratory failure with hypoxia (HCC) 03/02/2021   Elevated d-dimer 03/02/2021   Macrocytic anemia 03/02/2021   Peripheral neuropathy 03/02/2021   OSA (obstructive sleep apnea) 03/02/2021   Abnormal chest CT 03/27/2021   Folic acid deficiency 03/27/2021   Nausea 05/28/2021   COPD exacerbation (HCC) 10/09/2021   Iron deficiency 01/01/2022   Acute kidney injury superimposed on chronic kidney disease (HCC) 01/16/2023   Lactic acidosis 01/16/2023   Elevated troponin 01/16/2023   Hypoalbuminemia due to protein-calorie malnutrition (HCC) 01/16/2023   Lung nodule 01/29/2023   Intractable vomiting with nausea 01/29/2023   CAD (coronary artery disease) 05/14/2023   CKD stage G5/A3, GFR <15 and albumin creatinine ratio >  300 mg/g (HCC) 12/02/2022   Hyperglycemia due to diabetes mellitus (HCC) 12/02/2022   Recurrent sinusitis 07/01/2023   COPD, frequent exacerbations (HCC) 07/01/2023   Acute  viral pharyngitis 12/27/2023   Acute hypercapnic respiratory failure (HCC) 12/29/2023   SOB (shortness of breath) 12/30/2023   Resolved Ambulatory Problems    Diagnosis Date Noted   HYPOKALEMIA 12/12/2008   ANKLE PAIN, LEFT 02/05/2009   Sinusitis, chronic 02/20/2009   COPD (chronic obstructive pulmonary disease) (HCC) 09/09/2017   Acute non-recurrent pansinusitis 01/29/2018   Chronic maxillary sinusitis 08/17/2019   Urinary tract infection due to Enterococcus 10/16/2014   Pneumonia of right upper lobe due to infectious organism 10/24/2017   Encephalopathy acute 10/12/2014   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   Acute respiratory failure with hypoxia and hypercapnia (HCC) 10/09/2014   CAP (community acquired pneumonia) 03/02/2021   Accidental fall 03/02/2021   Abdominal pain 03/02/2021   Rhinosinusitis 02/07/2022   Sepsis due to pneumonia (HCC) 01/16/2023   Failure to thrive in adult 01/29/2023   Hypotension 01/29/2023   Subacute maxillary sinusitis 04/10/2023   SIRS (systemic inflammatory response syndrome) (HCC) 12/02/2022   Influenza A 12/29/2023   Past Medical History:  Diagnosis Date   Allergy    Hyperlipidemia    Hypertension    MRSA (methicillin resistant staph aureus) culture positive    Pain    Renal disorder    Thyroid disease          Observations/Objective: Pt is obese on 3L of O2  She is not is respiratory distressed but breathing hard She has a productive cough Appear pale and fatigued  .Marland Kitchen Today's Vitals   02/24/24 1331  BP: (!) 148/90  SpO2: 98%   There is no height or weight on file to calculate BMI.    Assessment and Plan: Marland KitchenMarland KitchenWhitley "Krystal Burgess" was seen today for nasal congestion.  Diagnoses and all orders for this visit:  Acute on chronic respiratory failure with hypoxia (HCC) -     doxycycline (VIBRA-TABS) 100 MG tablet; Take 1 tablet (100 mg total) by mouth 2 (two) times daily.  Chronic respiratory  failure with hypoxia (HCC)  COPD (chronic obstructive pulmonary disease) with chronic bronchitis (HCC)  COPD exacerbation (HCC) -     doxycycline (VIBRA-TABS) 100 MG tablet; Take 1 tablet (100 mg total) by mouth 2 (two) times daily.  Essential hypertension  Other orders -     Discontinue: predniSONE (DELTASONE) 10 MG tablet; Take one tablet daily for 7 days then 1/2 tablet daily until finished.   Start doxycycline and continued prednisone out at the 10mg  dose for another 7 days then can decrease down to maintaince dose of 5mg  daily.  Continue nebulizer every 2-4 hours as needed.  Continue to monitor pulse ox to keep above 90 percent.  Follow up as needed if symptoms persist or worsen.    Follow Up Instructions:    I discussed the assessment and treatment plan with the patient. The patient was provided an opportunity to ask questions and all were answered. The patient agreed with the plan and demonstrated an understanding of the instructions.   The patient was advised to call back or seek an in-person evaluation if the symptoms worsen or if the condition fails to improve as anticipated.   [name], PA-C

## 2024-02-25 MED ORDER — PREDNISONE 20 MG PO TABS
40.0000 mg | ORAL_TABLET | Freq: Every day | ORAL | 0 refills | Status: DC
Start: 2024-02-25 — End: 2024-03-07

## 2024-02-25 NOTE — Telephone Encounter (Signed)
Meds ordered this encounter  Medications   predniSONE (DELTASONE) 20 MG tablet    Sig: Take 2 tablets (40 mg total) by mouth daily with breakfast.    Dispense:  10 tablet    Refill:  0

## 2024-02-25 NOTE — Addendum Note (Signed)
 Addended by: Nani Gasser D on: 02/25/2024 05:38 PM   Modules accepted: Orders

## 2024-02-25 NOTE — Telephone Encounter (Signed)
 Seen yesterday

## 2024-03-01 ENCOUNTER — Telehealth: Payer: Self-pay

## 2024-03-01 NOTE — Telephone Encounter (Signed)
 Returned call to [employer] @ [facility] and provided orders.    Copied from CRM [id number]. Topic: Clinical - Home Health Verbal Orders >> Mar 01, 2024  1:00 PM Nila Nephew wrote: Caller/Agency: [facility] Callback Number: [phone] Service Requested: Social work Frequency: 1 time a month for 1 month Any new concerns about the patient? No

## 2024-03-02 DIAGNOSIS — N179 Acute kidney failure, unspecified: Secondary | ICD-10-CM | POA: Diagnosis not present

## 2024-03-02 DIAGNOSIS — M51369 Other intervertebral disc degeneration, lumbar region without mention of lumbar back pain or lower extremity pain: Secondary | ICD-10-CM | POA: Diagnosis not present

## 2024-03-02 DIAGNOSIS — E039 Hypothyroidism, unspecified: Secondary | ICD-10-CM | POA: Diagnosis not present

## 2024-03-02 DIAGNOSIS — G8929 Other chronic pain: Secondary | ICD-10-CM | POA: Diagnosis not present

## 2024-03-02 DIAGNOSIS — E1142 Type 2 diabetes mellitus with diabetic polyneuropathy: Secondary | ICD-10-CM | POA: Diagnosis not present

## 2024-03-02 DIAGNOSIS — E785 Hyperlipidemia, unspecified: Secondary | ICD-10-CM | POA: Diagnosis not present

## 2024-03-02 DIAGNOSIS — L89890 Pressure ulcer of other site, unstageable: Secondary | ICD-10-CM | POA: Diagnosis not present

## 2024-03-02 DIAGNOSIS — N185 Chronic kidney disease, stage 5: Secondary | ICD-10-CM | POA: Diagnosis not present

## 2024-03-02 DIAGNOSIS — J441 Chronic obstructive pulmonary disease with (acute) exacerbation: Secondary | ICD-10-CM | POA: Diagnosis not present

## 2024-03-02 DIAGNOSIS — J440 Chronic obstructive pulmonary disease with acute lower respiratory infection: Secondary | ICD-10-CM | POA: Diagnosis not present

## 2024-03-02 DIAGNOSIS — I120 Hypertensive chronic kidney disease with stage 5 chronic kidney disease or end stage renal disease: Secondary | ICD-10-CM | POA: Diagnosis not present

## 2024-03-02 DIAGNOSIS — J9622 Acute and chronic respiratory failure with hypercapnia: Secondary | ICD-10-CM | POA: Diagnosis not present

## 2024-03-02 DIAGNOSIS — E46 Unspecified protein-calorie malnutrition: Secondary | ICD-10-CM | POA: Diagnosis not present

## 2024-03-02 DIAGNOSIS — E538 Deficiency of other specified B group vitamins: Secondary | ICD-10-CM | POA: Diagnosis not present

## 2024-03-02 DIAGNOSIS — J9621 Acute and chronic respiratory failure with hypoxia: Secondary | ICD-10-CM | POA: Diagnosis not present

## 2024-03-02 DIAGNOSIS — E559 Vitamin D deficiency, unspecified: Secondary | ICD-10-CM | POA: Diagnosis not present

## 2024-03-02 DIAGNOSIS — E8809 Other disorders of plasma-protein metabolism, not elsewhere classified: Secondary | ICD-10-CM | POA: Diagnosis not present

## 2024-03-02 DIAGNOSIS — M48061 Spinal stenosis, lumbar region without neurogenic claudication: Secondary | ICD-10-CM | POA: Diagnosis not present

## 2024-03-02 DIAGNOSIS — E1122 Type 2 diabetes mellitus with diabetic chronic kidney disease: Secondary | ICD-10-CM | POA: Diagnosis not present

## 2024-03-02 DIAGNOSIS — A4102 Sepsis due to Methicillin resistant Staphylococcus aureus: Secondary | ICD-10-CM | POA: Diagnosis not present

## 2024-03-03 DIAGNOSIS — J9622 Acute and chronic respiratory failure with hypercapnia: Secondary | ICD-10-CM | POA: Diagnosis not present

## 2024-03-03 DIAGNOSIS — E1122 Type 2 diabetes mellitus with diabetic chronic kidney disease: Secondary | ICD-10-CM | POA: Diagnosis not present

## 2024-03-03 DIAGNOSIS — I120 Hypertensive chronic kidney disease with stage 5 chronic kidney disease or end stage renal disease: Secondary | ICD-10-CM | POA: Diagnosis not present

## 2024-03-03 DIAGNOSIS — N185 Chronic kidney disease, stage 5: Secondary | ICD-10-CM | POA: Diagnosis not present

## 2024-03-03 DIAGNOSIS — E039 Hypothyroidism, unspecified: Secondary | ICD-10-CM | POA: Diagnosis not present

## 2024-03-03 DIAGNOSIS — E1142 Type 2 diabetes mellitus with diabetic polyneuropathy: Secondary | ICD-10-CM | POA: Diagnosis not present

## 2024-03-03 DIAGNOSIS — E46 Unspecified protein-calorie malnutrition: Secondary | ICD-10-CM | POA: Diagnosis not present

## 2024-03-03 DIAGNOSIS — J440 Chronic obstructive pulmonary disease with acute lower respiratory infection: Secondary | ICD-10-CM | POA: Diagnosis not present

## 2024-03-03 DIAGNOSIS — J9621 Acute and chronic respiratory failure with hypoxia: Secondary | ICD-10-CM | POA: Diagnosis not present

## 2024-03-03 DIAGNOSIS — E785 Hyperlipidemia, unspecified: Secondary | ICD-10-CM | POA: Diagnosis not present

## 2024-03-03 DIAGNOSIS — L89890 Pressure ulcer of other site, unstageable: Secondary | ICD-10-CM | POA: Diagnosis not present

## 2024-03-03 DIAGNOSIS — E8809 Other disorders of plasma-protein metabolism, not elsewhere classified: Secondary | ICD-10-CM | POA: Diagnosis not present

## 2024-03-03 DIAGNOSIS — G8929 Other chronic pain: Secondary | ICD-10-CM | POA: Diagnosis not present

## 2024-03-03 DIAGNOSIS — E538 Deficiency of other specified B group vitamins: Secondary | ICD-10-CM | POA: Diagnosis not present

## 2024-03-03 DIAGNOSIS — N179 Acute kidney failure, unspecified: Secondary | ICD-10-CM | POA: Diagnosis not present

## 2024-03-03 DIAGNOSIS — M48061 Spinal stenosis, lumbar region without neurogenic claudication: Secondary | ICD-10-CM | POA: Diagnosis not present

## 2024-03-03 DIAGNOSIS — J441 Chronic obstructive pulmonary disease with (acute) exacerbation: Secondary | ICD-10-CM | POA: Diagnosis not present

## 2024-03-03 DIAGNOSIS — M51369 Other intervertebral disc degeneration, lumbar region without mention of lumbar back pain or lower extremity pain: Secondary | ICD-10-CM | POA: Diagnosis not present

## 2024-03-03 DIAGNOSIS — A4102 Sepsis due to Methicillin resistant Staphylococcus aureus: Secondary | ICD-10-CM | POA: Diagnosis not present

## 2024-03-03 DIAGNOSIS — E559 Vitamin D deficiency, unspecified: Secondary | ICD-10-CM | POA: Diagnosis not present

## 2024-03-04 DIAGNOSIS — E559 Vitamin D deficiency, unspecified: Secondary | ICD-10-CM | POA: Diagnosis not present

## 2024-03-04 DIAGNOSIS — J441 Chronic obstructive pulmonary disease with (acute) exacerbation: Secondary | ICD-10-CM | POA: Diagnosis not present

## 2024-03-04 DIAGNOSIS — M51369 Other intervertebral disc degeneration, lumbar region without mention of lumbar back pain or lower extremity pain: Secondary | ICD-10-CM | POA: Diagnosis not present

## 2024-03-04 DIAGNOSIS — A4102 Sepsis due to Methicillin resistant Staphylococcus aureus: Secondary | ICD-10-CM | POA: Diagnosis not present

## 2024-03-04 DIAGNOSIS — E8809 Other disorders of plasma-protein metabolism, not elsewhere classified: Secondary | ICD-10-CM | POA: Diagnosis not present

## 2024-03-04 DIAGNOSIS — N179 Acute kidney failure, unspecified: Secondary | ICD-10-CM | POA: Diagnosis not present

## 2024-03-04 DIAGNOSIS — E785 Hyperlipidemia, unspecified: Secondary | ICD-10-CM | POA: Diagnosis not present

## 2024-03-04 DIAGNOSIS — E039 Hypothyroidism, unspecified: Secondary | ICD-10-CM | POA: Diagnosis not present

## 2024-03-04 DIAGNOSIS — N185 Chronic kidney disease, stage 5: Secondary | ICD-10-CM | POA: Diagnosis not present

## 2024-03-04 DIAGNOSIS — G8929 Other chronic pain: Secondary | ICD-10-CM | POA: Diagnosis not present

## 2024-03-04 DIAGNOSIS — M48061 Spinal stenosis, lumbar region without neurogenic claudication: Secondary | ICD-10-CM | POA: Diagnosis not present

## 2024-03-04 DIAGNOSIS — J9622 Acute and chronic respiratory failure with hypercapnia: Secondary | ICD-10-CM | POA: Diagnosis not present

## 2024-03-04 DIAGNOSIS — E1122 Type 2 diabetes mellitus with diabetic chronic kidney disease: Secondary | ICD-10-CM | POA: Diagnosis not present

## 2024-03-04 DIAGNOSIS — L89890 Pressure ulcer of other site, unstageable: Secondary | ICD-10-CM | POA: Diagnosis not present

## 2024-03-04 DIAGNOSIS — E538 Deficiency of other specified B group vitamins: Secondary | ICD-10-CM | POA: Diagnosis not present

## 2024-03-04 DIAGNOSIS — E46 Unspecified protein-calorie malnutrition: Secondary | ICD-10-CM | POA: Diagnosis not present

## 2024-03-04 DIAGNOSIS — E1142 Type 2 diabetes mellitus with diabetic polyneuropathy: Secondary | ICD-10-CM | POA: Diagnosis not present

## 2024-03-04 DIAGNOSIS — J9621 Acute and chronic respiratory failure with hypoxia: Secondary | ICD-10-CM | POA: Diagnosis not present

## 2024-03-04 DIAGNOSIS — J440 Chronic obstructive pulmonary disease with acute lower respiratory infection: Secondary | ICD-10-CM | POA: Diagnosis not present

## 2024-03-04 DIAGNOSIS — I120 Hypertensive chronic kidney disease with stage 5 chronic kidney disease or end stage renal disease: Secondary | ICD-10-CM | POA: Diagnosis not present

## 2024-03-07 ENCOUNTER — Telehealth (INDEPENDENT_AMBULATORY_CARE_PROVIDER_SITE_OTHER): Payer: 59 | Admitting: Family Medicine

## 2024-03-07 DIAGNOSIS — G8929 Other chronic pain: Secondary | ICD-10-CM

## 2024-03-07 DIAGNOSIS — E8809 Other disorders of plasma-protein metabolism, not elsewhere classified: Secondary | ICD-10-CM | POA: Diagnosis not present

## 2024-03-07 DIAGNOSIS — J441 Chronic obstructive pulmonary disease with (acute) exacerbation: Secondary | ICD-10-CM | POA: Diagnosis not present

## 2024-03-07 DIAGNOSIS — M545 Low back pain, unspecified: Secondary | ICD-10-CM | POA: Diagnosis not present

## 2024-03-07 DIAGNOSIS — L89890 Pressure ulcer of other site, unstageable: Secondary | ICD-10-CM | POA: Diagnosis not present

## 2024-03-07 DIAGNOSIS — N185 Chronic kidney disease, stage 5: Secondary | ICD-10-CM | POA: Diagnosis not present

## 2024-03-07 DIAGNOSIS — E119 Type 2 diabetes mellitus without complications: Secondary | ICD-10-CM

## 2024-03-07 DIAGNOSIS — J440 Chronic obstructive pulmonary disease with acute lower respiratory infection: Secondary | ICD-10-CM | POA: Diagnosis not present

## 2024-03-07 DIAGNOSIS — A4102 Sepsis due to Methicillin resistant Staphylococcus aureus: Secondary | ICD-10-CM | POA: Diagnosis not present

## 2024-03-07 DIAGNOSIS — E538 Deficiency of other specified B group vitamins: Secondary | ICD-10-CM

## 2024-03-07 DIAGNOSIS — E785 Hyperlipidemia, unspecified: Secondary | ICD-10-CM | POA: Diagnosis not present

## 2024-03-07 DIAGNOSIS — M48061 Spinal stenosis, lumbar region without neurogenic claudication: Secondary | ICD-10-CM | POA: Diagnosis not present

## 2024-03-07 DIAGNOSIS — M51369 Other intervertebral disc degeneration, lumbar region without mention of lumbar back pain or lower extremity pain: Secondary | ICD-10-CM | POA: Diagnosis not present

## 2024-03-07 DIAGNOSIS — E1122 Type 2 diabetes mellitus with diabetic chronic kidney disease: Secondary | ICD-10-CM | POA: Diagnosis not present

## 2024-03-07 DIAGNOSIS — I120 Hypertensive chronic kidney disease with stage 5 chronic kidney disease or end stage renal disease: Secondary | ICD-10-CM | POA: Diagnosis not present

## 2024-03-07 DIAGNOSIS — N179 Acute kidney failure, unspecified: Secondary | ICD-10-CM | POA: Diagnosis not present

## 2024-03-07 DIAGNOSIS — E559 Vitamin D deficiency, unspecified: Secondary | ICD-10-CM | POA: Diagnosis not present

## 2024-03-07 DIAGNOSIS — H9313 Tinnitus, bilateral: Secondary | ICD-10-CM | POA: Diagnosis not present

## 2024-03-07 DIAGNOSIS — E46 Unspecified protein-calorie malnutrition: Secondary | ICD-10-CM | POA: Diagnosis not present

## 2024-03-07 DIAGNOSIS — E1142 Type 2 diabetes mellitus with diabetic polyneuropathy: Secondary | ICD-10-CM | POA: Diagnosis not present

## 2024-03-07 DIAGNOSIS — J4489 Other specified chronic obstructive pulmonary disease: Secondary | ICD-10-CM | POA: Diagnosis not present

## 2024-03-07 DIAGNOSIS — E039 Hypothyroidism, unspecified: Secondary | ICD-10-CM | POA: Diagnosis not present

## 2024-03-07 DIAGNOSIS — J9622 Acute and chronic respiratory failure with hypercapnia: Secondary | ICD-10-CM | POA: Diagnosis not present

## 2024-03-07 DIAGNOSIS — J9621 Acute and chronic respiratory failure with hypoxia: Secondary | ICD-10-CM | POA: Diagnosis not present

## 2024-03-07 DIAGNOSIS — Z79899 Other long term (current) drug therapy: Secondary | ICD-10-CM | POA: Diagnosis not present

## 2024-03-07 DIAGNOSIS — Z760 Encounter for issue of repeat prescription: Secondary | ICD-10-CM | POA: Diagnosis not present

## 2024-03-07 MED ORDER — TIZANIDINE HCL 2 MG PO TABS: 2.0000 mg | ORAL_TABLET | Freq: Two times a day (BID) | ORAL | 0 refills | Status: DC | PRN

## 2024-03-07 MED ORDER — TRELEGY ELLIPTA 100-62.5-25 MCG/ACT IN AEPB
INHALATION_SPRAY | RESPIRATORY_TRACT | 1 refills | Status: DC
Start: 2024-03-07 — End: 2024-04-01

## 2024-03-07 MED ORDER — BLOOD GLUCOSE MONITORING SUPPL DEVI: 1.0000 | Freq: Every day | 0 refills | Status: DC | PRN

## 2024-03-07 NOTE — Progress Notes (Signed)
 Virtual Visit via Video Note  I connected with Krystal Burgess on 03/07/24 at  2:00 PM EDT by a video enabled telemedicine application and verified that I am speaking with the correct person using two identifiers.   I discussed the limitations of evaluation and management by telemedicine and the availability of in person appointments. The patient expressed understanding and agreed to proceed.  Patient location: at home Provider location: in office  Subjective:    CC:  No chief complaint on file.   HPI: Here for virtual visit to address the medications that she is currently out of.  Requesting a refill on Trelegy she is yet to make an appointment with pulmonology right now she is having difficulty walking and so needs a refill on her Trelegy.  He also wanted to know what could be helpful for ear ringing she says is been going on for a while it is more bothersome in her right ear compared to her left.  Her eye appointment is coming up in about 10 days and needs a refill on her Combigan.  I did discuss with her that I do not feel these types of eyedrops that she has to be seen by an eye doctor to make sure that it is appropriate the last time she was seen at their office was almost 4 years ago.  She is still finishing up a steroid taper from recent upper respiratory illness.  She feels like it did cause a little bit of setback with her PT but is getting moving again.   Past medical history, Surgical history, Family history not pertinant except as noted below, Social history, Allergies, and medications have been entered into the medical record, reviewed, and corrections made.    Objective:    General: Speaking clearly in complete sentences without any shortness of breath.  Alert and oriented x3.  Normal judgment. No apparent acute distress.    Impression and Recommendations:    Problem List Items Addressed This Visit       Respiratory   COPD (chronic obstructive pulmonary  disease) with chronic bronchitis (HCC) - Primary   On chronic oxygen.  Will refill her Trelegy.       Relevant Medications   Fluticasone-Umeclidin-Vilant (TRELEGY ELLIPTA) 100-62.5-25 MCG/ACT AEPB     Endocrine   Type 2 diabetes mellitus (HCC)   Relevant Medications   Blood Glucose Monitoring Suppl DEVI   Diabetic peripheral neuropathy associated with type 2 diabetes mellitus (HCC)   Needs a new prescriptions for meter and strips.      Relevant Medications   Vilazodone HCl (VIIBRYD) 40 MG TABS   tiZANidine (ZANAFLEX) 2 MG tablet     Other   Vitamin B 12 deficiency   He says she currently takes vitamin B12.      Tinnitus of both ears   Discussed that she does need at least an exam just to rule out earwax that could be pressed up against the eardrum or other issues.  It for the exam is otherwise normal though there is not a lot of great treatments some Duddy studies showing B12 can be helpful but she already takes B12 and a little bit of studies showing some benefit with flavonoids.  But the data is not great.      Chronic back pain   Is now on a pain patch and doing well with that.  So we had actually decreased her tizanidine to 2 mg up to twice a day as needed  she only uses it occasionally.  She said recently the pharmacy did fill a 4 mg tab I am suspect that she may have had an old refill still on file so I did encourage her to split those tabs and for future can fill at 2 mg as needed.  Continue to use sparingly.      Relevant Medications   Vilazodone HCl (VIIBRYD) 40 MG TABS   tiZANidine (ZANAFLEX) 2 MG tablet    No orders of the defined types were placed in this encounter.   Meds ordered this encounter  Medications   tiZANidine (ZANAFLEX) 2 MG tablet    Sig: Take 1 tablet (2 mg total) by mouth 2 (two) times daily as needed for muscle spasms. Use sparingly.    Dispense:  45 tablet    Refill:  0    Pt will call when needed.   Fluticasone-Umeclidin-Vilant (TRELEGY  ELLIPTA) 100-62.5-25 MCG/ACT AEPB    Sig: INHALE 1 PUFF BY MOUTH EVERY DAY    Dispense:  60 each    Refill:  1   Blood Glucose Monitoring Suppl DEVI    Sig: 1 each by Does not apply route daily as needed. Include strips and lancets to test once daily.  Dx. Diabetes. May substitute to any manufacturer covered by patient's insurance.    Dispense:  1 each    Refill:  0     I discussed the assessment and treatment plan with the patient. The patient was provided an opportunity to ask questions and all were answered. The patient agreed with the plan and demonstrated an understanding of the instructions.   The patient was advised to call back or seek an in-person evaluation if the symptoms worsen or if the condition fails to improve as anticipated.   Nani Gasser, MD

## 2024-03-07 NOTE — Assessment & Plan Note (Signed)
 Is now on a pain patch and doing well with that.  So we had actually decreased her tizanidine to 2 mg up to twice a day as needed she only uses it occasionally.  She said recently the pharmacy did fill a 4 mg tab I am suspect that she may have had an old refill still on file so I did encourage her to split those tabs and for future can fill at 2 mg as needed.  Continue to use sparingly.

## 2024-03-07 NOTE — Assessment & Plan Note (Signed)
 Discussed that she does need at least an exam just to rule out earwax that could be pressed up against the eardrum or other issues.  It for the exam is otherwise normal though there is not a lot of great treatments some Duddy studies showing B12 can be helpful but she already takes B12 and a little bit of studies showing some benefit with flavonoids.  But the data is not great.

## 2024-03-07 NOTE — Progress Notes (Signed)
 Pt reports that she is congested again. She stated that she had some ABX that she had some from when she was seen by [facility] and she is doing some better.   She's requesting refills on Trelegy until she can get in with Pulm, and 27th with eye brimonidine-timolol 02%- 0.5% and Theophylline  She is now on a Bi-pap machine now

## 2024-03-07 NOTE — Assessment & Plan Note (Signed)
 Needs a new prescriptions for meter and strips.

## 2024-03-07 NOTE — Assessment & Plan Note (Signed)
 On chronic oxygen.  Will refill her Trelegy.

## 2024-03-07 NOTE — Assessment & Plan Note (Signed)
 He says she currently takes vitamin B12.

## 2024-03-08 ENCOUNTER — Encounter: Payer: Self-pay | Admitting: Family Medicine

## 2024-03-08 DIAGNOSIS — E538 Deficiency of other specified B group vitamins: Secondary | ICD-10-CM

## 2024-03-08 DIAGNOSIS — E1142 Type 2 diabetes mellitus with diabetic polyneuropathy: Secondary | ICD-10-CM

## 2024-03-08 DIAGNOSIS — J4489 Other specified chronic obstructive pulmonary disease: Secondary | ICD-10-CM

## 2024-03-08 DIAGNOSIS — E119 Type 2 diabetes mellitus without complications: Secondary | ICD-10-CM

## 2024-03-08 DIAGNOSIS — N179 Acute kidney failure, unspecified: Secondary | ICD-10-CM

## 2024-03-08 DIAGNOSIS — I10 Essential (primary) hypertension: Secondary | ICD-10-CM

## 2024-03-08 NOTE — Telephone Encounter (Signed)
 Labs reordered  Will need to fax order to home health to have drawn  Orders Placed This Encounter  Procedures   CMP14+EGFR   Lipid panel   CBC   TSH + free T4   B12   Fe+TIBC+Fer

## 2024-03-10 ENCOUNTER — Telehealth: Payer: Self-pay

## 2024-03-10 NOTE — Telephone Encounter (Signed)
 FYI

## 2024-03-10 NOTE — Telephone Encounter (Signed)
 Prescription for Trelegy was sent on March 17 to [facility] on [address].  Is she needing it sent somewhere else?

## 2024-03-10 NOTE — Telephone Encounter (Signed)
 Copied from CRM [id number]. Topic: General - Other >> Mar 10, 2024  8:11 AM Nila Nephew wrote: Reason for CRM: [place] with [facility] calling to report patient missed a visit.

## 2024-03-14 ENCOUNTER — Telehealth (INDEPENDENT_AMBULATORY_CARE_PROVIDER_SITE_OTHER): Admitting: Family Medicine

## 2024-03-14 ENCOUNTER — Encounter: Payer: Self-pay | Admitting: Family Medicine

## 2024-03-14 DIAGNOSIS — J449 Chronic obstructive pulmonary disease, unspecified: Secondary | ICD-10-CM | POA: Diagnosis not present

## 2024-03-14 DIAGNOSIS — J019 Acute sinusitis, unspecified: Secondary | ICD-10-CM

## 2024-03-14 DIAGNOSIS — J9620 Acute and chronic respiratory failure, unspecified whether with hypoxia or hypercapnia: Secondary | ICD-10-CM | POA: Diagnosis not present

## 2024-03-14 MED ORDER — CEFDINIR 300 MG PO CAPS: 300.0000 mg | ORAL_CAPSULE | Freq: Two times a day (BID) | ORAL | 0 refills | Status: DC

## 2024-03-14 MED ORDER — PREDNISONE 10 MG (21) PO TBPK: ORAL_TABLET | ORAL | 0 refills | Status: DC

## 2024-03-14 MED ORDER — SODIUM CHLORIDE-SODIUM BICARB 1.57 G NA PACK: PACK | NASAL | 99 refills | Status: DC

## 2024-03-14 NOTE — Progress Notes (Signed)
 Virtual Visit via Video Note  I connected with Krystal Burgess on 03/14/24 at  2:00 PM EDT by a video enabled telemedicine application and verified that I am speaking with the correct person using two identifiers.   I discussed the limitations of evaluation and management by telemedicine and the availability of in person appointments. The patient expressed understanding and agreed to proceed.  Patient location: at home Provider location: in office  Subjective:    CC:   Chief Complaint  Patient presents with   Sinusitis    HPI:  Pt reports that her sinuses are all clogged up. She is full. She has some green mucus. She has a cough, and nasal congestion. Right ear pain.  Not using nasal saline.     Taking Tylenol cold and Mucinex.    She was asking about a machine that puts medication like a nebulizer that puts the medication up into your nose. It's battery operated. She thought that [name] might know what it was. She stated that it really helped her previously    Is also supposed to be in court on Wednesday so needs an updated letter for her lawyer stating that she is still homebound.  Past medical history, Surgical history, Family history not pertinant except as noted below, Social history, Allergies, and medications have been entered into the medical record, reviewed, and corrections made.    Objective:    General: Speaking clearly in complete sentences without any shortness of breath.  Alert and oriented x3.  Normal judgment. No apparent acute distress.    Impression and Recommendations:    Problem List Items Addressed This Visit   None Visit Diagnoses       Acute non-recurrent sinusitis, unspecified location    -  Primary   Relevant Medications   cefdinir (OMNICEF) 300 MG capsule   predniSONE (STERAPRED UNI-PAK 21 TAB) 10 MG (21) TBPK tablet   Sodium Chloride-Sodium Bicarb 1.57 g PACK       Go ahead and treat for sinusitis she feels like she is at the  point where she would benefit from a prednisone.  We do try to limit she would prefer to do a prednisone pack.  I also think she would benefit from using saline irrigation encouraged her to pick that up at the pharmacy as well.  She was hoping to get a nasal nebulizer but I do not write for though she would really need to get that from an ENT.  Once she is more mobile I am happy to refer her.  No orders of the defined types were placed in this encounter.   Meds ordered this encounter  Medications   cefdinir (OMNICEF) 300 MG capsule    Sig: Take 1 capsule (300 mg total) by mouth 2 (two) times daily.    Dispense:  14 capsule    Refill:  0   predniSONE (STERAPRED UNI-PAK 21 TAB) 10 MG (21) TBPK tablet    Sig: Take as directed.    Dispense:  21 each    Refill:  0   Sodium Chloride-Sodium Bicarb 1.57 g PACK    Sig: Nasal saline irrigation nightly.    Dispense:  1 each    Refill:  PRN     I discussed the assessment and treatment plan with the patient. The patient was provided an opportunity to ask questions and all were answered. The patient agreed with the plan and demonstrated an understanding of the instructions.   The patient  was advised to call back or seek an in-person evaluation if the symptoms worsen or if the condition fails to improve as anticipated.  I spent 20 minutes on the day of the encounter to include pre-visit record review, face-to-face time with the patient and post visit ordering of test.   Nani Gasser, MD

## 2024-03-14 NOTE — Progress Notes (Signed)
 Pt reports that her sinuses are all clogged up. She is full. She has some green mucus. She has a cough, and nasal congestion  Taking Tylenol cold and Mucinex.   She was asking about a machine that puts medication like a nebulizer that puts the medication up into your nose. It's battery operated. She thought that [name] might know what it was. She stated that it really helped her previously

## 2024-03-15 DIAGNOSIS — E46 Unspecified protein-calorie malnutrition: Secondary | ICD-10-CM | POA: Diagnosis not present

## 2024-03-15 DIAGNOSIS — I120 Hypertensive chronic kidney disease with stage 5 chronic kidney disease or end stage renal disease: Secondary | ICD-10-CM | POA: Diagnosis not present

## 2024-03-15 DIAGNOSIS — M48061 Spinal stenosis, lumbar region without neurogenic claudication: Secondary | ICD-10-CM | POA: Diagnosis not present

## 2024-03-15 DIAGNOSIS — E559 Vitamin D deficiency, unspecified: Secondary | ICD-10-CM | POA: Diagnosis not present

## 2024-03-15 DIAGNOSIS — E8809 Other disorders of plasma-protein metabolism, not elsewhere classified: Secondary | ICD-10-CM | POA: Diagnosis not present

## 2024-03-15 DIAGNOSIS — G8929 Other chronic pain: Secondary | ICD-10-CM | POA: Diagnosis not present

## 2024-03-15 DIAGNOSIS — J9622 Acute and chronic respiratory failure with hypercapnia: Secondary | ICD-10-CM | POA: Diagnosis not present

## 2024-03-15 DIAGNOSIS — E1122 Type 2 diabetes mellitus with diabetic chronic kidney disease: Secondary | ICD-10-CM | POA: Diagnosis not present

## 2024-03-15 DIAGNOSIS — E785 Hyperlipidemia, unspecified: Secondary | ICD-10-CM | POA: Diagnosis not present

## 2024-03-15 DIAGNOSIS — J9620 Acute and chronic respiratory failure, unspecified whether with hypoxia or hypercapnia: Secondary | ICD-10-CM | POA: Diagnosis not present

## 2024-03-15 DIAGNOSIS — J440 Chronic obstructive pulmonary disease with acute lower respiratory infection: Secondary | ICD-10-CM | POA: Diagnosis not present

## 2024-03-15 DIAGNOSIS — N179 Acute kidney failure, unspecified: Secondary | ICD-10-CM | POA: Diagnosis not present

## 2024-03-15 DIAGNOSIS — L89890 Pressure ulcer of other site, unstageable: Secondary | ICD-10-CM | POA: Diagnosis not present

## 2024-03-15 DIAGNOSIS — A4102 Sepsis due to Methicillin resistant Staphylococcus aureus: Secondary | ICD-10-CM | POA: Diagnosis not present

## 2024-03-15 DIAGNOSIS — N185 Chronic kidney disease, stage 5: Secondary | ICD-10-CM | POA: Diagnosis not present

## 2024-03-15 DIAGNOSIS — E538 Deficiency of other specified B group vitamins: Secondary | ICD-10-CM | POA: Diagnosis not present

## 2024-03-15 DIAGNOSIS — E1142 Type 2 diabetes mellitus with diabetic polyneuropathy: Secondary | ICD-10-CM | POA: Diagnosis not present

## 2024-03-15 DIAGNOSIS — E039 Hypothyroidism, unspecified: Secondary | ICD-10-CM | POA: Diagnosis not present

## 2024-03-15 DIAGNOSIS — J9621 Acute and chronic respiratory failure with hypoxia: Secondary | ICD-10-CM | POA: Diagnosis not present

## 2024-03-15 DIAGNOSIS — M51369 Other intervertebral disc degeneration, lumbar region without mention of lumbar back pain or lower extremity pain: Secondary | ICD-10-CM | POA: Diagnosis not present

## 2024-03-15 DIAGNOSIS — J449 Chronic obstructive pulmonary disease, unspecified: Secondary | ICD-10-CM | POA: Diagnosis not present

## 2024-03-15 DIAGNOSIS — J441 Chronic obstructive pulmonary disease with (acute) exacerbation: Secondary | ICD-10-CM | POA: Diagnosis not present

## 2024-03-18 DIAGNOSIS — J449 Chronic obstructive pulmonary disease, unspecified: Secondary | ICD-10-CM | POA: Diagnosis not present

## 2024-03-21 ENCOUNTER — Encounter: Payer: Self-pay | Admitting: Family Medicine

## 2024-03-21 DIAGNOSIS — E119 Type 2 diabetes mellitus without complications: Secondary | ICD-10-CM

## 2024-03-22 ENCOUNTER — Telehealth: Payer: Self-pay

## 2024-03-22 DIAGNOSIS — J449 Chronic obstructive pulmonary disease, unspecified: Secondary | ICD-10-CM | POA: Diagnosis not present

## 2024-03-22 MED ORDER — AMLODIPINE BESYLATE 10 MG PO TABS: 10.0000 mg | ORAL_TABLET | Freq: Every day | ORAL | 0 refills | Status: DC

## 2024-03-22 MED ORDER — BRIMONIDINE TARTRATE-TIMOLOL 0.2-0.5 % OP SOLN
1.0000 [drp] | Freq: Two times a day (BID) | OPHTHALMIC | 0 refills | Status: AC
Start: 2024-03-22 — End: 2024-04-12

## 2024-03-22 NOTE — Telephone Encounter (Signed)
 Inc amlodipine to 10mg .    Meds ordered this encounter  Medications   brimonidine-timolol (COMBIGAN) 0.2-0.5 % ophthalmic solution    Sig: Place 1 drop into the right eye every 12 (twelve) hours for 21 days.    Dispense:  5 mL    Refill:  0   amLODipine (NORVASC) 10 MG tablet    Sig: Take 1 tablet (10 mg total) by mouth daily.    Dispense:  90 tablet    Refill:  0

## 2024-03-22 NOTE — Telephone Encounter (Signed)
 Copied from CRM [id number]. Topic: Clinical - Medical Advice >> Mar 22, 2024  9:22 AM Carlatta H wrote: Reason for CRM: 167/92 is the patients blood pressure for this morning//Please call patient to advise

## 2024-03-22 NOTE — Telephone Encounter (Signed)
 I am going to try increasing the amlodipine to 10 mg.  Make sure you are limiting fluids to no more than 60 ounces a day total.  Avoid more than 2000 mg of sodium daily.

## 2024-03-22 NOTE — Telephone Encounter (Signed)
 Please see message from patient.  Requesting rx rf of  Brimonidine-timolol 0.5-0.5% opthl sol Last written historical provider  Last OV 01/18/2024 Upcoming appt =09/01/2024- [facility]

## 2024-03-23 ENCOUNTER — Other Ambulatory Visit: Payer: Self-pay | Admitting: Family Medicine

## 2024-03-23 NOTE — Telephone Encounter (Signed)
 To help her furosemdie work better and keep her BP under better control

## 2024-03-23 NOTE — Telephone Encounter (Signed)
 Patient asked why the 60 ounces limited of fluids? And sodium restriction?

## 2024-03-24 NOTE — Telephone Encounter (Signed)
 Task completed. Patient has been updated of the provider's recommendation. No other inquiries during the call.

## 2024-03-30 ENCOUNTER — Ambulatory Visit: Payer: Self-pay

## 2024-03-30 ENCOUNTER — Telehealth: Admitting: Nurse Practitioner

## 2024-03-30 NOTE — Progress Notes (Unsigned)
 Virtual Visit via *** Note Due to COVID-19 pandemic this visit was conducted virtually. This visit type was conducted due to national recommendations for restrictions regarding the COVID-19 Pandemic (e.g. social distancing, sheltering in place) in an effort to limit this patient's exposure and mitigate transmission in our community. All issues noted in this document were discussed and addressed.  A physical exam was not performed with this format.   I connected with Krystal Burgess on 03/30/2024 at *** by *** and verified that I am speaking with the correct person using two identifiers. Krystal Burgess is currently located at *** and {family members:[id number]} is currently with them during visit. The provider, Krystal Sinner, NP is located in their office at time of visit.  I discussed the limitations, risks, security and privacy concerns of performing an evaluation and management service by virtual visit and the availability of in person appointments. I also discussed with the patient that there may be a patient responsible charge related to this service. The patient expressed understanding and agreed to proceed.  Subjective:  Patient ID: Krystal Burgess, female    DOB: [birth year], 63 y.o.   MRN: [id number]  Chief Complaint:  No chief complaint on file.   HPI: Krystal Burgess is a 63 y.o. female presenting on 03/30/2024 for No chief complaint on file.   HPI   Relevant past medical, surgical, family, and social history reviewed and updated as indicated.  Allergies and medications reviewed and updated.   Past Medical History:  Diagnosis Date   Allergy    COPD (chronic obstructive pulmonary disease) (HCC)    Glaucoma    Hyperlipidemia    Hypertension    MRSA (methicillin resistant staph aureus) culture positive    Pain    Renal disorder    Thyroid disease     Past Surgical History:  Procedure Laterality Date   CESAREAN SECTION     CHOLECYSTECTOMY     lipoma removal      RT shoulder   LUMBAR SPINE SURGERY  12/23/2011   Dr. Manson Passey, L4-5   pilonydal cyst  12/22/1992   TOTAL ABDOMINAL HYSTERECTOMY  1999   for abnormal cervical cells and fibroids    Social History   Socioeconomic History   Marital status: Divorced    Spouse name: Not on file   Number of children: 1   Years of education: 1   Highest education level: 12th grade  Occupational History   Occupation: Disabled  Tobacco Use   Smoking status: Former    Current packs/day: 0.00    Types: Cigarettes    Quit date: 03/19/2020    Years since quitting: 4.0   Smokeless tobacco: Never  Vaping Use   Vaping status: Some Days   Substances: Nicotine  Substance and Sexual Activity   Alcohol use: Not Currently   Drug use: No   Sexual activity: Not Currently  Other Topics Concern   Not on file  Social History Narrative   Lives with her daughter. She enjoys playing games on her computer.   Social Drivers of Corporate investment banker Strain: Low Risk  (12/25/2023)   Overall Financial Resource Strain (CARDIA)    Difficulty of Paying Living Expenses: Not very hard  Food Insecurity: No Food Insecurity (01/20/2024)   Hunger Vital Sign    Worried About Running Out of Food in the Last Year: Never true    Ran Out of Food in the Last Year: Never true  Transportation  Needs: No Transportation Needs (01/20/2024)   PRAPARE - Administrator, Civil Service (Medical): No    Lack of Transportation (Non-Medical): No  Physical Activity: Inactive (12/25/2023)   Exercise Vital Sign    Days of Exercise per Week: 0 days    Minutes of Exercise per Session: 0 min  Stress: Stress Concern Present (01/05/2024)   Received from [facility] of Occupational Health - Occupational Stress Questionnaire    Feeling of Stress : Very much  Social Connections: Socially Isolated (12/25/2023)   Social Connection and Isolation Panel [NHANES]    Frequency of Communication with Friends and Family: Once a  week    Frequency of Social Gatherings with Friends and Family: Never    Attends Religious Services: Never    Database administrator or Organizations: No    Attends Banker Meetings: Never    Marital Status: Divorced  Catering manager Violence: Not At Risk (01/20/2024)   Humiliation, Afraid, Rape, and Kick questionnaire    Fear of Current or Ex-Partner: No    Emotionally Abused: No    Physically Abused: No    Sexually Abused: No    Outpatient Encounter Medications as of 03/30/2024  Medication Sig   ALPRAZolam (XANAX) 1 MG tablet Take 1 tablet (1 mg total) by mouth 3 (three) times daily as needed for anxiety.   AMBULATORY NON FORMULARY MEDICATION Battery powered portable oxygen concentrator   amLODipine (NORVASC) 10 MG tablet Take 1 tablet (10 mg total) by mouth daily.   Blood Glucose Monitoring Suppl DEVI 1 each by Does not apply route daily as needed. Include strips and lancets to test once daily.  Dx. Diabetes. May substitute to any manufacturer covered by patient's insurance.   brimonidine-timolol (COMBIGAN) 0.2-0.5 % ophthalmic solution Place 1 drop into the right eye every 12 (twelve) hours for 21 days.   diclofenac (VOLTAREN) 75 MG EC tablet Take 75 mg by mouth 2 (two) times daily.   fluticasone (FLONASE) 50 MCG/ACT nasal spray SPRAY 2 SPRAYS INTO EACH NOSTRIL EVERY DAY   Fluticasone-Umeclidin-Vilant (TRELEGY ELLIPTA) 100-62.5-25 MCG/ACT AEPB INHALE 1 PUFF BY MOUTH EVERY DAY   folic acid (FOLVITE) 1 MG tablet Take 1 mg by mouth daily.   furosemide (LASIX) 20 MG tablet Take 20 mg by mouth every other day.   gabapentin (NEURONTIN) 600 MG tablet TAKE 2 TABLETS (1,200 MG TOTAL) BY MOUTH AT BEDTIME. PATIENT TAKES 1200 MG DAILY   ipratropium (ATROVENT) 0.06 % nasal spray Place 2 sprays into both nostrils 3 (three) times daily.   ipratropium-albuterol (DUONEB) 0.5-2.5 (3) MG/3ML SOLN INHALE 3 MLS INTO THE LUNGS EVERY 2 (TWO) HOURS AS NEEDED (WHEEZING OR SHORTNESS OF BREATH).    levocetirizine (XYZAL) 5 MG tablet Take 5 mg by mouth.   levothyroxine (SYNTHROID) 88 MCG tablet TAKE 1 TABLET BY MOUTH EVERY DAY   losartan (COZAAR) 100 MG tablet TAKE 1 TABLET BY MOUTH EVERY DAY   pantoprazole (PROTONIX) 40 MG tablet TAKE 1 TABLET BY MOUTH EVERY DAY   promethazine (PHENERGAN) 25 MG tablet Take 25 mg by mouth every 6 (six) hours as needed for nausea or vomiting.   rosuvastatin (CRESTOR) 20 MG tablet Take 1 tablet (20 mg total) by mouth at bedtime.   Sodium Chloride-Sodium Bicarb 1.57 g PACK Nasal saline irrigation nightly.   tiZANidine (ZANAFLEX) 2 MG tablet Take 1 tablet (2 mg total) by mouth 2 (two) times daily as needed for muscle spasms. Use sparingly.  tiZANidine (ZANAFLEX) 4 MG tablet TAKE 1 TABLET BY MOUTH AT BEDTIME AS NEEDED FOR MUSCLE SPASMS.   traZODone (DESYREL) 50 MG tablet Take 50 mg by mouth at bedtime as needed for sleep.   Vilazodone HCl (VIIBRYD) 40 MG TABS Take 40 mg by mouth daily.   No facility-administered encounter medications on file as of 03/30/2024.    Allergies  Allergen Reactions   Risperidone And Related Shortness Of Breath   Aripiprazole Other (See Comments)    REACTION: muscle jerks    Fluoxetine Hcl Other (See Comments)    REACTION: Intolerant   Nsaids Other (See Comments)    Upper GI bleed CKD 3    Paroxetine Other (See Comments)    REACTION: Intolerance    Ziprasidone Hcl Other (See Comments)    shakes    Chantix [Varenicline Tartrate] Other (See Comments)    "messing with my mood"   Metformin And Related Nausea Only   Montelukast Other (See Comments)    "it kept me sick with an upper respiratory infection"   Onglyza [Saxagliptin] Nausea Only   Augmentin [Amoxicillin-Pot Clavulanate] Other (See Comments)    Gets yeast infection every time    Fluoxetine Hcl     REACTION: Intolerant    Review of Systems       Observations/Objective: No vital signs or physical exam, this was a virtual health encounter.  Pt alert and  oriented, answers all questions appropriately, and able to speak in full sentences.    Assessment and Plan: There are no diagnoses linked to this encounter.   Follow Up Instructions: No follow-ups on file.    I discussed the assessment and treatment plan with the patient. The patient was provided an opportunity to ask questions and all were answered. The patient agreed with the plan and demonstrated an understanding of the instructions.   The patient was advised to call back or seek an in-person evaluation if the symptoms worsen or if the condition fails to improve as anticipated.  The above assessment and management plan was discussed with the patient. The patient verbalized understanding of and has agreed to the management plan. Patient is aware to call the clinic if they develop any new symptoms or if symptoms persist or worsen. Patient is aware when to return to the clinic for a follow-up visit. Patient educated on when it is appropriate to go to the emergency department.    I provided *** minutes of time during this *** encounter.   Arrie Aran Santa Lighter, DNP [facility] [address] [place], [place] [address] [phone] 03/30/2024

## 2024-03-30 NOTE — Telephone Encounter (Signed)
 Copied from CRM [id number]. Topic: Clinical - Red Word Triage >> Mar 30, 2024  2:34 PM [name] C wrote: Red Word that prompted transfer to Nurse Triage: Patient is has been having trouble with keeping her oxygen up, she believes she is coming down with something. She has been coughing up phlegm and congested.  Chief Complaint: sinus pain, congestion,  Symptoms:sinus pain/headache, nasal congestion,  cough- yellow sputum, fever, ringing in right ear Frequency: yesterday Pertinent Negatives: Patient denies n/v Disposition: [] ED /[] Urgent Care (no appt availability in office) / [x] Appointment(In office/virtual)/ []  [facility]/ [] Home Care/ [] Refused Recommended Disposition /[] [facility]/ []  Follow-up with PCP Additional Notes: difficulty breathing at times due to congestion: O294@3L , chills yesterday.  Offered pt in office visit: pt stated at present time she is unable to walk due to being in hospital so long and presently due to PT to rebuild leg muscle/strength to try to walk again. Schedule pt video visit.  Reason for Disposition  [1] Sinus congestion (pressure, fullness) AND [2] present > 10 days  Answer Assessment - Initial Assessment Questions 1. LOCATION: "Where does it hurt?"      headache 2. ONSET: "When did the sinus pain start?"  (e.g., hours, days)      yesterday 3. SEVERITY: "How bad is the pain?"   (Scale 1-10; mild, moderate or severe)   - MILD (1-3): doesn't interfere with normal activities    - MODERATE (4-7): interferes with normal activities (e.g., work or school) or awakens from sleep   - SEVERE (8-10): excruciating pain and patient unable to do any normal activities        5/10 4. RECURRENT SYMPTOM: "Have you ever had sinus problems before?" If Yes, ask: "When was the last time?" and "What happened that time?"      Yes sinus issues in the past 5. NASAL CONGESTION: "Is the nose blocked?" If Yes, ask: "Can you open it or must you breathe through your  mouth?"     Yes  6. NASAL DISCHARGE: "Do you have discharge from your nose?" If so ask, "What color?"     yellow 7. FEVER: "Do you have a fever?" If Yes, ask: "What is it, how was it measured, and when did it start?"      yes 8. OTHER SYMPTOMS: "Do you have any other symptoms?" (e.g., sore throat, cough, earache, difficulty breathing)     Cough-yellow, right ear ringing, difficulty breathing at times 9. PREGNANCY: "Is there any chance you are pregnant?" "When was your last menstrual period?"     N/a  Protocols used: Sinus Pain or Congestion-A-AH

## 2024-03-31 ENCOUNTER — Encounter: Payer: Self-pay | Admitting: Family Medicine

## 2024-03-31 ENCOUNTER — Other Ambulatory Visit: Payer: Self-pay | Admitting: Family Medicine

## 2024-03-31 DIAGNOSIS — J4489 Other specified chronic obstructive pulmonary disease: Secondary | ICD-10-CM

## 2024-03-31 NOTE — Telephone Encounter (Signed)
 [name],  Please see mychart message sent by pt and advise.

## 2024-04-01 ENCOUNTER — Encounter: Payer: Self-pay | Admitting: Family Medicine

## 2024-04-01 MED ORDER — CEFDINIR 300 MG PO CAPS: 300.0000 mg | ORAL_CAPSULE | Freq: Two times a day (BID) | ORAL | 0 refills | Status: DC

## 2024-04-01 NOTE — Telephone Encounter (Signed)
Please see the MyChart message reply(ies) for my assessment and plan.    This patient gave consent for this Medical Advice Message and is aware that it may result in a bill to their insurance company, as well as the possibility of receiving a bill for a co-payment or deductible. They are an established patient, but are not seeking medical advice exclusively about a problem treated during an in person or video visit in the last seven days. I did not recommend an in person or video visit within seven days of my reply.    I spent a total of 6 minutes cumulative time within 7 days through MyChart messaging.  Uziah Sorter, MD   

## 2024-04-03 ENCOUNTER — Encounter: Payer: Self-pay | Admitting: Family Medicine

## 2024-04-05 ENCOUNTER — Telehealth: Payer: Self-pay | Admitting: Family Medicine

## 2024-04-05 ENCOUNTER — Telehealth: Payer: Self-pay

## 2024-04-05 ENCOUNTER — Other Ambulatory Visit: Payer: Self-pay | Admitting: Family Medicine

## 2024-04-05 DIAGNOSIS — J4489 Other specified chronic obstructive pulmonary disease: Secondary | ICD-10-CM

## 2024-04-05 NOTE — Telephone Encounter (Signed)
 Attempted call to patient. Left a voice mail message requesting a return call.

## 2024-04-05 NOTE — Telephone Encounter (Signed)
 Per Dr. Greer Leak appt must be in office in person.  Patient informed and scheduled for 04/05/24 with Dr. Augustus Ledger.

## 2024-04-05 NOTE — Telephone Encounter (Signed)
 Scheduled with DR. Matthews for tomorrow 04/05/2024 at 3:50pm =kph

## 2024-04-05 NOTE — Telephone Encounter (Unsigned)
 Copied from CRM [id number]. Topic: Clinical - Medication Question >> Apr 05, 2024 11:07 AM Krystal Burgess wrote: Reason for CRM: cefdinir (OMNICEF) 300 MG capsule [[id number] stated infection is getting in her Chest and feels this medication is not working. Wants to prevent going to the hospital prevent from getting pneumonia like she usually gets in the past. Patient has COPD issues. Had Triage on 04/09 and they did not do anything for her she felt. Denied going to triage again. Request only to send message to PCP. Need call back from PCP due to needs better medication or mor of it to prevent her from going to the hospital. [phone]

## 2024-04-05 NOTE — Telephone Encounter (Signed)
 Needs appt

## 2024-04-05 NOTE — Telephone Encounter (Signed)
 Copied from CRM [id number]. Topic: Clinical - Medication Question >> Apr 05, 2024 11:07 AM Krystal Burgess wrote: Reason for CRM: cefdinir (OMNICEF) 300 MG capsule [[id number] stated infection is getting in her Chest and feels this medication is not working. Wants to prevent going to the hospital prevent from getting pneumonia like she usually gets in the past. Patient has COPD issues. Had Triage on 04/09 and they did not do anything for her she felt. Denied going to triage again. Request only to send message to PCP. Need call back from PCP due to needs better medication or mor of it to prevent her from going to the hospital. [phone] >> Apr 05, 2024  1:20 PM Krystal Burgess wrote: Patient called back, per note from [name], patient needs appointment. Patient stated appointment needed to be virtual because she still cannot walk, offered her first available appointment with [name] for this Thursday, 4/17. Patient stated that date will not work because she has an eye appointment. Stated if the doctor cannot call her something in then she guesses she will just have to find another doctor.

## 2024-04-06 ENCOUNTER — Ambulatory Visit (INDEPENDENT_AMBULATORY_CARE_PROVIDER_SITE_OTHER): Admitting: Family Medicine

## 2024-04-06 ENCOUNTER — Ambulatory Visit (INDEPENDENT_AMBULATORY_CARE_PROVIDER_SITE_OTHER)

## 2024-04-06 ENCOUNTER — Encounter: Payer: Self-pay | Admitting: Family Medicine

## 2024-04-06 VITALS — BP 118/74 | HR 98 | Ht 64.0 in | Wt 250.0 lb

## 2024-04-06 DIAGNOSIS — R062 Wheezing: Secondary | ICD-10-CM | POA: Diagnosis not present

## 2024-04-06 DIAGNOSIS — J441 Chronic obstructive pulmonary disease with (acute) exacerbation: Secondary | ICD-10-CM | POA: Diagnosis not present

## 2024-04-06 DIAGNOSIS — R0602 Shortness of breath: Secondary | ICD-10-CM

## 2024-04-06 MED ORDER — METHYLPREDNISOLONE SODIUM SUCC 125 MG IJ SOLR
125.0000 mg | Freq: Once | INTRAMUSCULAR | Status: AC
  Administered 2024-04-06: 125 mg via INTRAMUSCULAR

## 2024-04-06 MED ORDER — PREDNISONE 20 MG PO TABS: 20.0000 mg | ORAL_TABLET | Freq: Two times a day (BID) | ORAL | 0 refills | Status: AC

## 2024-04-06 MED ORDER — ALBUTEROL SULFATE HFA 108 (90 BASE) MCG/ACT IN AERS: 2.0000 | INHALATION_SPRAY | Freq: Four times a day (QID) | RESPIRATORY_TRACT | 0 refills | Status: DC | PRN

## 2024-04-06 MED ORDER — DOXYCYCLINE HYCLATE 100 MG PO TABS: 100.0000 mg | ORAL_TABLET | Freq: Two times a day (BID) | ORAL | 0 refills | Status: DC

## 2024-04-06 NOTE — Assessment & Plan Note (Signed)
 Recommend continuation of Trelegy daily.  DuoNebs every 4-6 hours as needed.  I did refill her albuterol as well.  Given injection of Solu-Medrol 125 mg today and will add an additional 5-day burst of prednisone.  Change antibiotic to doxycycline as Omnicef does not seem to be effective.  I did order a two-view chest x-ray as well.

## 2024-04-06 NOTE — Progress Notes (Signed)
 Krystal Burgess - 63 y.o. female MRN [id number]  Date of birth: Sep 25, 1961  Subjective Chief Complaint  Patient presents with   URI    HPI LEANNAH Burgess is a 63 year old female here today for follow-up of respiratory infection.  Symptoms started a few weeks ago.  Seen via video visit x 2.  She has been on 2 courses of Omnicef.  Prednisone taper in March was prescribed but she does not recall taking this.  She continues to have wheezing and dyspnea.  She denies chest pain, fever or chills.  She has had some increased sputum production.  She does have DuoNeb but is currently out of albuterol.  She is using Trelegy regularly.  She has had to increase her oxygen from her baseline 2 L to 3 - 4 L..  She is concerned because she reports that she gets pneumonia easily.  ROS:  A comprehensive ROS was completed and negative except as noted per HPI  Allergies  Allergen Reactions   Risperidone And Related Shortness Of Breath   Aripiprazole Other (See Comments)    REACTION: muscle jerks    Fluoxetine Hcl Other (See Comments)    REACTION: Intolerant   Nsaids Other (See Comments)    Upper GI bleed CKD 3    Paroxetine Other (See Comments)    REACTION: Intolerance    Ziprasidone Hcl Other (See Comments)    shakes    Chantix [Varenicline Tartrate] Other (See Comments)    "messing with my mood"   Metformin And Related Nausea Only   Montelukast Other (See Comments)    "it kept me sick with an upper respiratory infection"   Onglyza [Saxagliptin] Nausea Only   Augmentin [Amoxicillin-Pot Clavulanate] Other (See Comments)    Gets yeast infection every time    Fluoxetine Hcl     REACTION: Intolerant    Past Medical History:  Diagnosis Date   Allergy    COPD (chronic obstructive pulmonary disease) (HCC)    Glaucoma    Hyperlipidemia    Hypertension    MRSA (methicillin resistant staph aureus) culture positive    Pain    Renal disorder    Thyroid disease     Past Surgical History:   Procedure Laterality Date   CESAREAN SECTION     CHOLECYSTECTOMY     lipoma removal     RT shoulder   LUMBAR SPINE SURGERY  12/23/2011   Dr. Manson Passey, L4-5   pilonydal cyst  12/22/1992   TOTAL ABDOMINAL HYSTERECTOMY  1999   for abnormal cervical cells and fibroids    Social History   Socioeconomic History   Marital status: Divorced    Spouse name: Not on file   Number of children: 1   Years of education: 1   Highest education level: 12th grade  Occupational History   Occupation: Disabled  Tobacco Use   Smoking status: Former    Current packs/day: 0.00    Types: Cigarettes    Quit date: 03/19/2020    Years since quitting: 4.0   Smokeless tobacco: Never  Vaping Use   Vaping status: Some Days   Substances: Nicotine  Substance and Sexual Activity   Alcohol use: Not Currently   Drug use: No   Sexual activity: Not Currently  Other Topics Concern   Not on file  Social History Narrative   Lives with her daughter. She enjoys playing games on her computer.   Social Drivers of Health   Financial Resource Strain: Low Risk  (12/25/2023)  Overall Financial Resource Strain (CARDIA)    Difficulty of Paying Living Expenses: Not very hard  Food Insecurity: No Food Insecurity (01/20/2024)   Hunger Vital Sign    Worried About Running Out of Food in the Last Year: Never true    Ran Out of Food in the Last Year: Never true  Transportation Needs: No Transportation Needs (01/20/2024)   PRAPARE - Administrator, Civil Service (Medical): No    Lack of Transportation (Non-Medical): No  Physical Activity: Inactive (12/25/2023)   Exercise Vital Sign    Days of Exercise per Week: 0 days    Minutes of Exercise per Session: 0 min  Stress: Stress Concern Present (01/05/2024)   Received from [facility] of Occupational Health - Occupational Stress Questionnaire    Feeling of Stress : Very much  Social Connections: Socially Isolated (12/25/2023)   Social Connection  and Isolation Panel [NHANES]    Frequency of Communication with Friends and Family: Once a week    Frequency of Social Gatherings with Friends and Family: Never    Attends Religious Services: Never    Database administrator or Organizations: No    Attends Engineer, structural: Never    Marital Status: Divorced    Family History  Problem Relation Age of Onset   Diabetes Brother    Depression Brother        bipolar   Cancer Brother        throat   Diabetes Other        grandmother   Depression Mother    Hyperlipidemia Mother    Hypertension Mother    Pancreatic cancer Mother    Hypertension Father     Health Maintenance  Topic Date Due   COVID-19 Vaccine (1) Never done   Zoster Vaccines- Shingrix (1 of 2) Never done   OPHTHALMOLOGY EXAM  06/01/2020   FOOT EXAM  11/28/2022   HEMOGLOBIN A1C  07/17/2023   Diabetic kidney evaluation - Urine ACR  12/17/2023   MAMMOGRAM  08/24/2024 (Originally 04/25/2019)   INFLUENZA VACCINE  07/22/2024   Medicare Annual Wellness (AWV)  08/24/2024   Colonoscopy  10/19/2024   Diabetic kidney evaluation - eGFR measurement  12/30/2024   DTaP/Tdap/Td (2 - Td or Tdap) 11/06/2027   Hepatitis C Screening  Completed   HIV Screening  Completed   HPV VACCINES  Aged Out   Meningococcal B Vaccine  Aged Out   Pneumococcal Vaccine 50-84 Years old  Discontinued     ----------------------------------------------------------------------------------------------------------------------------------------------------------------------------------------------------------------- Physical Exam BP 118/74 (BP Location: Left Wrist, Patient Position: Sitting, Cuff Size: Normal)   Pulse 98   Ht 5\' 4"  (1.626 m)   Wt 250 lb (113.4 kg)   SpO2 97%   PF (!) 3 L/min   BMI 42.91 kg/m   Physical Exam Constitutional:      Appearance: Normal appearance.  Eyes:     General: No scleral icterus. Cardiovascular:     Rate and Rhythm: Normal rate and regular  rhythm.  Pulmonary:     Effort: Pulmonary effort is normal.     Breath sounds: Normal breath sounds.  Musculoskeletal:     Cervical back: Neck supple.  Neurological:     Mental Status: She is alert.     Gait: Gait normal.  Psychiatric:        Mood and Affect: Mood normal.        Behavior: Behavior normal.     ------------------------------------------------------------------------------------------------------------------------------------------------------------------------------------------------------------------- Assessment and  Plan  COPD exacerbation (HCC) Recommend continuation of Trelegy daily.  DuoNebs every 4-6 hours as needed.  I did refill her albuterol as well.  Given injection of Solu-Medrol 125 mg today and will add an additional 5-day burst of prednisone.  Change antibiotic to doxycycline as Omnicef does not seem to be effective.  I did order a two-view chest x-ray as well.   Meds ordered this encounter  Medications   doxycycline (VIBRA-TABS) 100 MG tablet    Sig: Take 1 tablet (100 mg total) by mouth 2 (two) times daily.    Dispense:  20 tablet    Refill:  0   predniSONE (DELTASONE) 20 MG tablet    Sig: Take 1 tablet (20 mg total) by mouth 2 (two) times daily with a meal for 5 days.    Dispense:  10 tablet    Refill:  0   albuterol (VENTOLIN HFA) 108 (90 Base) MCG/ACT inhaler    Sig: Inhale 2 puffs into the lungs every 6 (six) hours as needed for wheezing or shortness of breath.    Dispense:  8 g    Refill:  0   methylPREDNISolone sodium succinate (SOLU-MEDROL) 125 mg/2 mL injection 125 mg    No follow-ups on file.

## 2024-04-07 DIAGNOSIS — E119 Type 2 diabetes mellitus without complications: Secondary | ICD-10-CM | POA: Diagnosis not present

## 2024-04-07 DIAGNOSIS — H02834 Dermatochalasis of left upper eyelid: Secondary | ICD-10-CM | POA: Diagnosis not present

## 2024-04-07 DIAGNOSIS — H02831 Dermatochalasis of right upper eyelid: Secondary | ICD-10-CM | POA: Diagnosis not present

## 2024-04-07 DIAGNOSIS — H40033 Anatomical narrow angle, bilateral: Secondary | ICD-10-CM | POA: Diagnosis not present

## 2024-04-07 DIAGNOSIS — H26493 Other secondary cataract, bilateral: Secondary | ICD-10-CM | POA: Diagnosis not present

## 2024-04-07 DIAGNOSIS — H526 Other disorders of refraction: Secondary | ICD-10-CM | POA: Diagnosis not present

## 2024-04-11 ENCOUNTER — Encounter: Payer: Self-pay | Admitting: Family Medicine

## 2024-04-13 ENCOUNTER — Telehealth: Payer: Self-pay | Admitting: Family Medicine

## 2024-04-13 NOTE — Telephone Encounter (Signed)
 Received note from [employer] needing us  to document the use of her CPAP machine.  We have done several acute visits lately so have not had an opportunity to do a visit specifically for documentation so that we can submit that for insurance coverage.  Patient will need to make an appointment to do so.  Okay to do virtual but we will need to get a download from her machine before the visit.

## 2024-04-14 DIAGNOSIS — J449 Chronic obstructive pulmonary disease, unspecified: Secondary | ICD-10-CM | POA: Diagnosis not present

## 2024-04-14 DIAGNOSIS — J9620 Acute and chronic respiratory failure, unspecified whether with hypoxia or hypercapnia: Secondary | ICD-10-CM | POA: Diagnosis not present

## 2024-04-14 NOTE — Telephone Encounter (Signed)
 Earliest appt available was for 05/17/2024 patient scheduled for this time  Is this too long for patient to wait ? Called [employer] and will send download of CPAP for the past 2 weeks to 1 month.

## 2024-04-15 DIAGNOSIS — J9620 Acute and chronic respiratory failure, unspecified whether with hypoxia or hypercapnia: Secondary | ICD-10-CM | POA: Diagnosis not present

## 2024-04-15 DIAGNOSIS — J449 Chronic obstructive pulmonary disease, unspecified: Secondary | ICD-10-CM | POA: Diagnosis not present

## 2024-04-15 NOTE — Telephone Encounter (Signed)
 Thank you :)

## 2024-04-18 ENCOUNTER — Ambulatory Visit

## 2024-04-18 ENCOUNTER — Encounter: Payer: Self-pay | Admitting: Physician Assistant

## 2024-04-18 ENCOUNTER — Telehealth: Payer: Self-pay

## 2024-04-18 ENCOUNTER — Telehealth (INDEPENDENT_AMBULATORY_CARE_PROVIDER_SITE_OTHER): Admitting: Physician Assistant

## 2024-04-18 ENCOUNTER — Ambulatory Visit: Payer: Self-pay

## 2024-04-18 ENCOUNTER — Other Ambulatory Visit: Payer: Self-pay | Admitting: Physician Assistant

## 2024-04-18 DIAGNOSIS — F17200 Nicotine dependence, unspecified, uncomplicated: Secondary | ICD-10-CM | POA: Diagnosis not present

## 2024-04-18 DIAGNOSIS — M545 Low back pain, unspecified: Secondary | ICD-10-CM

## 2024-04-18 DIAGNOSIS — M79661 Pain in right lower leg: Secondary | ICD-10-CM

## 2024-04-18 DIAGNOSIS — J449 Chronic obstructive pulmonary disease, unspecified: Secondary | ICD-10-CM | POA: Diagnosis not present

## 2024-04-18 DIAGNOSIS — R6889 Other general symptoms and signs: Secondary | ICD-10-CM | POA: Diagnosis not present

## 2024-04-18 DIAGNOSIS — M79606 Pain in leg, unspecified: Secondary | ICD-10-CM | POA: Diagnosis not present

## 2024-04-18 DIAGNOSIS — M7989 Other specified soft tissue disorders: Secondary | ICD-10-CM | POA: Diagnosis not present

## 2024-04-18 DIAGNOSIS — M79604 Pain in right leg: Secondary | ICD-10-CM | POA: Diagnosis not present

## 2024-04-18 NOTE — Progress Notes (Signed)
 No blood clot in leg GREAT news. I would brace your right leg with compression sleeve, ice it, stay off of it. I will refer you to orthopedic asap for evaluation for strain vs tear of calf.

## 2024-04-18 NOTE — Telephone Encounter (Signed)
 Copied from CRM [id number]. Topic: General - Other >> Apr 18, 2024  8:09 AM Retta Caster wrote: Reason for CRM: Patient returning office call . Request to speak to office direct due to pain issue that she was speaking to someone. Called and transferred

## 2024-04-18 NOTE — Progress Notes (Signed)
..  Virtual Visit via Video Note  I connected with Krystal Burgess on 04/18/24 at  9:10 AM EDT by a video enabled telemedicine application and verified that I am speaking with the correct person using two identifiers.  Location: Patient: home Provider: clinic  .Aaron AasParticipating in visit:  Patient: Krystal Burgess Provider: Sandy Crumb PA-C   I discussed the limitations of evaluation and management by telemedicine and the availability of in person appointments. The patient expressed understanding and agreed to proceed.  History of Present Illness: Pt is a 63 yo obese female with chronic pain who calls into the clinic with right calf and leg pain that started Thursday morning, 5 days ago. She just woke up and noticed her right calf was very painful and pain shot up right leg. She cannot stand at all. She denies any injury. She can extend her leg. Some minor swelling of right calf. She has been taking advil  and on bustrans patch for pain. Not on any blood thinners. She sates heating pad and muscle relaxers only help very little. She has been in an out of hospital and she has really been laying in bed a lot for the last 3 months.   She sees pain clinic but states she is not going to see them because they will not see her virtually anymore. She also states "pain medication not working".  She had another appt scheduled with another clinic for 30th but had to be rescheduled but does not know when.    Observations/Objective: No acute distress Pt reports to be in severe pain and needs a pain medication that will work.  She is not able to stand on right leg.    Assessment and Plan: Aaron AasAaron AasZita "Krystal Burgess" was seen today for leg pain.  Diagnoses and all orders for this visit:  Chronic low back pain, unspecified back pain laterality, unspecified whether sciatica present  Right calf pain -     US  Venous Img Lower Unilateral Right; Future   Concern for blood clot. Need to get venous doppler. Pt states she  cannot come to the office for this u/s until she has pain medication and what she has currently is not working.   Aaron AasAaron AasPDMP reviewed during this encounter. Pt has butrans patch. She is switching pain clinics right now because current pain clinic is not willing to see her virtually at all.  Discussed pain control needs with her PCP and declined to give her any other pain medication other than what she has from her pain clinic. If she cannot get in for the ultrasound due to patient she needs to go to ED. As far as future pain needs she should establish care with another pain clinic as she eluded too.   There could also be a strain or tear. Icing could help with magnesium  creams. If no blood clot consider stretches.   Follow Up Instructions:    I discussed the assessment and treatment plan with the patient. The patient was provided an opportunity to ask questions and all were answered. The patient agreed with the plan and demonstrated an understanding of the instructions.   The patient was advised to call back or seek an in-person evaluation if the symptoms worsen or if the condition fails to improve as anticipated.   Makesha Belitz, PA-C

## 2024-04-18 NOTE — Telephone Encounter (Signed)
 Transferred to [facility].

## 2024-04-18 NOTE — Telephone Encounter (Signed)
 Pt was seen by VV and offered tramadol  for pain by Jade. Pt states that she does not feel as though tramadol  will be helpful enough. Pt states that she would like something stronger. Pt states that she is scheduled at the pain clinic for this Saturday. Pt states she just needs something until then. Pt also states that the US  aggravated it. Pt states she attempted to get the CT that PCP wanted done but there was no order.  Please call pt back.  Copied from CRM [id number]. Topic: General - Other >> Apr 18, 2024  8:09 AM Retta Caster wrote: Reason for CRM: Patient returning office call . Request to speak to office direct due to pain issue that she was speaking to someone. Called and transferred >> Apr 18, 2024  3:38 PM [name] wrote: Patient called to advise that she had Ultrasound done and would like to request that some pain medication be sent in. Patient advised that she knows Tramadol  won't help her pain entirely but it'll take the edge off. Please assist.  >> Apr 18, 2024 11:48 AM [name] wrote: Patient stated she was previously speaking with a nurse, she wants the clinic to go ahead and put an order in for CT for today she will do her best to make it. She is irate and in pain, patient apologized for her temper but she is hurting. I tried to get her to nurse triage patient refused and just wanted me to let the clinc know to put the order in  >> Apr 18, 2024  9:46 AM [name] wrote: Patient called to relay a message for [employer]. During a recent video visit, the provider mentioned they would call in something for pain management until a new appointment could be scheduled. The patient is now calling to update that she has a pain management appointment set for 04/23/2024 at 2:45 PM. Reason for Disposition  Prescription request for new medicine (not a refill)  Answer Assessment - Initial Assessment Questions 2. QUESTION: "What is your question?" (e.g., double dose of medicine, side effect)     "Jade  offered tramadol  today but that does not work for me, during the US  they pushed and aggravated the leg and it is more painful"  Protocols used: Medication Question Call-A-AH

## 2024-04-18 NOTE — Telephone Encounter (Signed)
 Patient scheduled.

## 2024-04-18 NOTE — Telephone Encounter (Signed)
 Just to confirm, I don't manage her pain. We have the same rules for pain mgt as her current provider.  We can't do it virtually.

## 2024-04-20 ENCOUNTER — Other Ambulatory Visit

## 2024-04-20 DIAGNOSIS — M51362 Other intervertebral disc degeneration, lumbar region with discogenic back pain and lower extremity pain: Secondary | ICD-10-CM | POA: Diagnosis not present

## 2024-04-20 DIAGNOSIS — G894 Chronic pain syndrome: Secondary | ICD-10-CM | POA: Diagnosis not present

## 2024-04-20 DIAGNOSIS — M5416 Radiculopathy, lumbar region: Secondary | ICD-10-CM | POA: Diagnosis not present

## 2024-04-20 NOTE — Telephone Encounter (Signed)
 Patient has been notified of the provider's note. Verbalized understanding. No further action is required.

## 2024-04-22 ENCOUNTER — Ambulatory Visit: Payer: Self-pay | Admitting: *Deleted

## 2024-04-22 ENCOUNTER — Encounter: Payer: Self-pay | Admitting: Physician Assistant

## 2024-04-22 ENCOUNTER — Ambulatory Visit: Payer: Self-pay

## 2024-04-22 ENCOUNTER — Telehealth: Admitting: Physician Assistant

## 2024-04-22 DIAGNOSIS — M79604 Pain in right leg: Secondary | ICD-10-CM

## 2024-04-22 NOTE — Telephone Encounter (Signed)
 Pt calling in c/o pain.   When agent transferred to me no one was there.   I called her back.   Left a voicemail to call back.   She has called several times regarding pain over the last few days.  I will route this to the call back basket for further attempts.

## 2024-04-22 NOTE — Telephone Encounter (Signed)
 Copied from CRM [id number]. Topic: General - Other >> Apr 18, 2024  8:09 AM Krystal Burgess wrote: Reason for CRM: Patient returning office call . Request to speak to office direct due to pain issue that she was speaking to someone. Called and transferred >> Apr 22, 2024  2:07 PM [name] wrote: Patient called to advise that she is still in extreme pain. Please assist.  >> Apr 18, 2024  3:38 PM [name] wrote: Patient called to advise that she had Ultrasound done and would like to request that some pain medication be sent in. Patient advised that she knows Tramadol  won't help her pain entirely but it'll take the edge off. Please assist.  >> Apr 18, 2024 11:48 AM [name] wrote: Patient stated she was previously speaking with a nurse, she wants the clinic to go ahead and put an order in for CT for today she will do her best to make it. She is irate and in pain, patient apologized for her temper but she is hurting. I tried to get her to nurse triage patient refused and just wanted me to let the clinc know to put the order in  >> Apr 18, 2024  9:46 AM [name] wrote: Patient called to relay a message for [employer]. During a recent video visit, the provider mentioned they would call in something for pain management until a new appointment could be scheduled. The patient is now calling to update that she has a pain management appointment set for 04/23/2024 at 2:45 PM.

## 2024-04-22 NOTE — Progress Notes (Signed)
 Virtual Visit Consent   Krystal Burgess, you are scheduled for a virtual visit with a [facility] provider today. Just as with appointments in the office, your consent must be obtained to participate. Your consent will be active for this visit and any virtual visit you may have with one of our providers in the next 365 days. If you have a MyChart account, a copy of this consent can be sent to you electronically.  As this is a virtual visit, video technology does not allow for your provider to perform a traditional examination. This may limit your provider's ability to fully assess your condition. If your provider identifies any concerns that need to be evaluated in person or the need to arrange testing (such as labs, EKG, etc.), we will make arrangements to do so. Although advances in technology are sophisticated, we cannot ensure that it will always work on either your end or our end. If the connection with a video visit is poor, the visit may have to be switched to a telephone visit. With either a video or telephone visit, we are not always able to ensure that we have a secure connection.  By engaging in this virtual visit, you consent to the provision of healthcare and authorize for your insurance to be billed (if applicable) for the services provided during this visit. Depending on your insurance coverage, you may receive a charge related to this service.  I need to obtain your verbal consent now. Are you willing to proceed with your visit today? Krystal Burgess has provided verbal consent on 04/22/2024 for a virtual visit (video or telephone). Krystal Burgess, [place]  Date: 04/22/2024 5:39 PM   Virtual Visit via Video Note   I, Krystal Burgess, connected with  Krystal Burgess  ([id number], [birth year]) on 04/22/24 at  5:30 PM EDT by a video-enabled telemedicine application and verified that I am speaking with the correct person using two identifiers.  Location: Patient: Virtual Visit Location Patient:  Home Provider: Virtual Visit Location Provider: Home Office   I discussed the limitations of evaluation and management by telemedicine and the availability of in person appointments. The patient expressed understanding and agreed to proceed.    History of Present Illness: [name] is a 63 y.o. who identifies as a female who was assigned female at birth, and is being seen today for leg pain.  HPI: Leg Pain  The incident occurred at home. There was no injury mechanism. The pain is present in the right leg. The quality of the pain is described as aching. The pain is moderate. The pain has been Constant since onset. She reports no foreign bodies present. The symptoms are aggravated by movement. She has tried non-weight bearing and heat for the symptoms. The treatment provided mild relief.    Problems:  Patient Active Problem List   Diagnosis Date Noted   Right calf pain 04/18/2024   Tinnitus of both ears 03/07/2024   MRSA pneumonia (HCC) 01/14/2024   MRSA bacteremia 01/14/2024   Influenzal pneumonia 01/14/2024   SOB (shortness of breath) 12/30/2023   Acute hypercapnic respiratory failure (HCC) 12/29/2023   Recurrent sinusitis 07/01/2023   COPD, frequent exacerbations (HCC) 07/01/2023   CAD (coronary artery disease) 05/14/2023   Lung nodule 01/29/2023   Intractable vomiting with nausea 01/29/2023   Acute kidney injury superimposed on chronic kidney disease (HCC) 01/16/2023   Lactic acidosis 01/16/2023   Elevated troponin 01/16/2023   Hypoalbuminemia due to protein-calorie malnutrition (HCC) 01/16/2023  CKD stage G5/A3, GFR <15 and albumin creatinine ratio >300 mg/g (HCC) 12/02/2022   Hyperglycemia due to diabetes mellitus (HCC) 12/02/2022   Iron deficiency 01/01/2022   COPD exacerbation (HCC) 10/09/2021   Nausea 05/28/2021   Abnormal chest CT 03/27/2021   Folic acid  deficiency 03/27/2021   Acute on chronic respiratory failure with hypoxia (HCC) 03/02/2021   Elevated d-dimer  03/02/2021   Macrocytic anemia 03/02/2021   Peripheral neuropathy 03/02/2021   OSA (obstructive sleep apnea) 03/02/2021   RLS (restless legs syndrome) 01/14/2019   Sinus tachycardia 04/08/2018   Hypernatremia 04/08/2018   Chronic ethmoidal sinusitis 01/29/2018   Stage 3b chronic kidney disease (CKD) (HCC) 10/26/2017   Obesity, Class III, BMI 40-49.9 (morbid obesity) 10/26/2017   Cutaneous candidiasis 10/24/2017   Chronic back pain 10/24/2017   Chronic respiratory failure with hypoxia (HCC) 10/23/2017   Hypomagnesemia 10/23/2017   Glaucoma 10/23/2017   Chronic insomnia 01/30/2017   Pulmonary nodule, RML, needs noncontrast CT in February 2019 01/26/2017   Lumbar degenerative disc disease 08/14/2016   Combined forms of age-related cataract of 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   Posterior synechiae (iris), 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   Lower GI bleed 10/18/2014   Gastroesophageal reflux disease without esophagitis 10/12/2014   Diabetic peripheral neuropathy associated with type 2 diabetes mellitus (HCC) 10/12/2014   Leukocytosis 10/08/2014   History of anxiety 10/08/2014   Type 2 diabetes mellitus (HCC) 09/07/2014   Acute angle-closure glaucoma 10/03/2013   Bilateral carpal tunnel syndrome 03/14/2013   Bipolar disorder (HCC) 01/24/2013   Steroid-induced diabetes (HCC) 01/06/2013   Vitamin B 12 deficiency 06/24/2011   Allergic rhinitis 08/17/2009   Vitamin D deficiency 07/13/2009   Severe obesity (BMI >= 40) (HCC) 05/03/2009   Essential hypertension 03/26/2009   LEG EDEMA, BILATERAL 03/26/2009   Hypothyroidism 02/20/2009   Mixed hyperlipidemia 12/12/2008   Former smoker 12/12/2008   MDD (major depressive disorder), recurrent episode, severe (HCC) 12/12/2008   Mononeuritis 12/12/2008   Intrinsic asthma 12/12/2008   COPD (chronic obstructive pulmonary disease) with chronic bronchitis (HCC) 12/12/2008   Spinal stenosis of lumbar region 12/12/2008    Allergies:  Allergies  Allergen Reactions    Risperidone  And Related Shortness Of Breath   Aripiprazole Other (See Comments)    REACTION: muscle jerks    Fluoxetine Hcl Other (See Comments)    REACTION: Intolerant   Nsaids Other (See Comments)    Upper GI bleed CKD 3    Paroxetine Other (See Comments)    REACTION: Intolerance    Ziprasidone  Hcl Other (See Comments)    shakes    Chantix  [Varenicline  Tartrate] Other (See Comments)    "messing with my mood"   Metformin And Related Nausea Only   Montelukast  Other (See Comments)    "it kept me sick with an upper respiratory infection"   Onglyza [Saxagliptin ] Nausea Only   Augmentin  [Amoxicillin -Pot Clavulanate] Other (See Comments)    Gets yeast infection every time    Fluoxetine Hcl     REACTION: Intolerant   Medications:  Current Outpatient Medications:    albuterol  (VENTOLIN  HFA) 108 (90 Base) MCG/ACT inhaler, Inhale 2 puffs into the lungs every 6 (six) hours as needed for wheezing or shortness of breath., Disp: 8 g, Rfl: 0   ALPRAZolam  (XANAX ) 1 MG tablet, Take 1 tablet (1 mg total) by mouth 3 (three) times daily as needed for anxiety., Disp: 30 tablet, Rfl: 0   AMBULATORY NON FORMULARY MEDICATION, Battery powered portable oxygen  concentrator, Disp: 1 each, Rfl: 0  amLODipine  (NORVASC ) 10 MG tablet, Take 1 tablet (10 mg total) by mouth daily., Disp: 90 tablet, Rfl: 0   Azelastine  HCl 137 MCG/SPRAY SOLN, Place into both nostrils., Disp: , Rfl:    Blood Glucose Monitoring Suppl DEVI, 1 each by Does not apply route daily as needed. Include strips and lancets to test once daily.  Dx. Diabetes. May substitute to any manufacturer covered by patient's insurance., Disp: 1 each, Rfl: 0   buprenorphine (BUTRANS) 20 MCG/HR PTWK, Place 1 patch onto the skin once a week., Disp: , Rfl:    cefdinir  (OMNICEF ) 300 MG capsule, Take 1 capsule (300 mg total) by mouth 2 (two) times daily., Disp: 14 capsule, Rfl: 0   diclofenac  (VOLTAREN ) 75 MG EC tablet, Take 75 mg by mouth 2 (two) times  daily., Disp: , Rfl:    doxycycline  (VIBRA -TABS) 100 MG tablet, Take 1 tablet (100 mg total) by mouth 2 (two) times daily., Disp: 20 tablet, Rfl: 0   fluticasone  (FLONASE ) 50 MCG/ACT nasal spray, SPRAY 2 SPRAYS INTO EACH NOSTRIL EVERY DAY, Disp: 48 mL, Rfl: 1   folic acid  (FOLVITE ) 1 MG tablet, Take 1 mg by mouth daily., Disp: , Rfl:    furosemide  (LASIX ) 20 MG tablet, Take 20 mg by mouth every other day., Disp: , Rfl:    gabapentin  (NEURONTIN ) 600 MG tablet, TAKE 2 TABLETS (1,200 MG TOTAL) BY MOUTH AT BEDTIME. PATIENT TAKES 1200 MG DAILY, Disp: 180 tablet, Rfl: 1   ipratropium (ATROVENT ) 0.06 % nasal spray, Place 2 sprays into both nostrils 3 (three) times daily., Disp: 45 mL, Rfl: 11   ipratropium-albuterol  (DUONEB) 0.5-2.5 (3) MG/3ML SOLN, INHALE 3 MLS INTO THE LUNGS EVERY 2 (TWO) HOURS AS NEEDED (WHEEZING OR SHORTNESS OF BREATH)., Disp: 540 mL, Rfl: 6   levocetirizine (XYZAL ) 5 MG tablet, Take 5 mg by mouth., Disp: , Rfl:    levothyroxine  (SYNTHROID ) 88 MCG tablet, TAKE 1 TABLET BY MOUTH EVERY DAY, Disp: 90 tablet, Rfl: 1   losartan  (COZAAR ) 100 MG tablet, TAKE 1 TABLET BY MOUTH EVERY DAY, Disp: 90 tablet, Rfl: 1   pantoprazole  (PROTONIX ) 40 MG tablet, TAKE 1 TABLET BY MOUTH EVERY DAY, Disp: 90 tablet, Rfl: 3   promethazine  (PHENERGAN ) 25 MG tablet, Take 25 mg by mouth every 6 (six) hours as needed for nausea or vomiting., Disp: , Rfl:    rosuvastatin  (CRESTOR ) 20 MG tablet, Take 1 tablet (20 mg total) by mouth at bedtime., Disp: 90 tablet, Rfl: 3   Sodium Chloride -Sodium Bicarb 1.57 g PACK, Nasal saline irrigation nightly., Disp: 1 each, Rfl: PRN   tiZANidine  (ZANAFLEX ) 2 MG tablet, Take 1 tablet (2 mg total) by mouth 2 (two) times daily as needed for muscle spasms. Use sparingly., Disp: 45 tablet, Rfl: 0   traZODone  (DESYREL ) 50 MG tablet, Take 50 mg by mouth at bedtime as needed for sleep., Disp: , Rfl:    TRELEGY ELLIPTA  100-62.5-25 MCG/ACT AEPB, INHALE 1 PUFF BY MOUTH EVERY DAY, Disp: 180  each, Rfl: 1   Vilazodone  HCl (VIIBRYD ) 40 MG TABS, Take 40 mg by mouth daily., Disp: , Rfl:   Observations/Objective: Patient is well-developed, well-nourished in no acute distress.  Resting comfortably  at home.  Head is normocephalic, atraumatic.  No labored breathing.  Speech is clear and coherent with logical content.  Patient is alert and oriented at baseline.    Assessment and Plan: 1. Bilateral leg pain (Primary)  Patient advised to report to nearest ER for treatment of pain meds. I  advised tylenol  and ibuprofen  she stated that she didn't want narcotics but that wouldn't help her. I advised her to report to the nearest ER for evaluation as she became tearful during our call. All questions answered.   Follow Up Instructions: I discussed the assessment and treatment plan with the patient. The patient was provided an opportunity to ask questions and all were answered. The patient agreed with the plan and demonstrated an understanding of the instructions.  A copy of instructions were sent to the patient via MyChart unless otherwise noted below.    The patient was advised to call back or seek an in-person evaluation if the symptoms worsen or if the condition fails to improve as anticipated.    Krystal Settle, PA-C

## 2024-04-22 NOTE — Telephone Encounter (Signed)
 Copied from CRM [id number]. Topic: Clinical - Red Word Triage >> Apr 22, 2024  2:13 PM Brynn Caras wrote: Red Word that prompted transfer to Nurse Triage: The patient is in extreme pain, states she was disconnected previously today when transferred.  Chief Complaint: leg pain Symptoms: right leg pain 10/10 - mostly on outer part of leg and extends into foot Frequency: x 1 week Pertinent Negatives: Patient denies SOB, redness, fever, numbness, tingling Disposition: [] ED /[] Urgent Care (no appt availability in office) / [] Appointment(In office/virtual)/ []  [facility]/ [x] Home Care/ [] Refused Recommended Disposition /[] [place] [facility]/ []  Follow-up with PCP Additional Notes: possible to be related to PT /exercise. Pt states pain is soo intense it causes her to hold breath when pain increases. Pt stated she attempted to speak wit someone before regarding the pain and never heard back.  Next week pt has an appointment and states pain is so bad she may not be able to go to appointment due to pain.  Virtual urgent care appt made due to no in office appt for today and pt unable to ambulate to office due to pain.  Reason for Disposition  [1] SEVERE pain (e.g., excruciating, unable to do any normal activities) AND [2] not improved after 2 hours of pain medicine  Answer Assessment - Initial Assessment Questions 1. ONSET: "When did the pain start?"      X 1 week 2. LOCATION: "Where is the pain located?"      Right leg outer part and into foot 3. PAIN: "How bad is the pain?"    (Scale 1-10; or mild, moderate, severe)   -  MILD (1-3): doesn't interfere with normal activities    -  MODERATE (4-7): interferes with normal activities (e.g., work or school) or awakens from sleep, limping    -  SEVERE (8-10): excruciating pain, unable to do any normal activities, unable to walk     10/10 4. WORK OR EXERCISE: "Has there been any recent work or exercise that involved this part of the body?"       no 5. CAUSE: "What do you think is causing the leg pain?"     unknown 6. OTHER SYMPTOMS: "Do you have any other symptoms?" (e.g., chest pain, back pain, breathing difficulty, swelling, rash, fever, numbness, weakness)     Difficulty breathing due to pain,weakness due to being in the hospital prior to for over a month 7. PREGNANCY: "Is there any chance you are pregnant?" "When was your last menstrual period?"     N/a  Protocols used: Leg Pain-A-AH

## 2024-04-22 NOTE — Patient Instructions (Signed)
 [name], thank you for joining [name], PA-C for today's virtual visit.  While this provider is not your primary care provider (PCP), if your PCP is located in our provider database this encounter information will be shared with them immediately following your visit.   A [facility] MyChart account gives you access to today's visit and all your visits, tests, and labs performed at [facility] " click here if you don't have a [facility] MyChart account or go to [email or web address]  Consent: (Patient) Krystal Burgess provided verbal consent for this virtual visit at the beginning of the encounter.  Current Medications:  Current Outpatient Medications:    albuterol  (VENTOLIN  HFA) 108 (90 Base) MCG/ACT inhaler, Inhale 2 puffs into the lungs every 6 (six) hours as needed for wheezing or shortness of breath., Disp: 8 g, Rfl: 0   ALPRAZolam  (XANAX ) 1 MG tablet, Take 1 tablet (1 mg total) by mouth 3 (three) times daily as needed for anxiety., Disp: 30 tablet, Rfl: 0   AMBULATORY NON FORMULARY MEDICATION, Battery powered portable oxygen  concentrator, Disp: 1 each, Rfl: 0   amLODipine  (NORVASC ) 10 MG tablet, Take 1 tablet (10 mg total) by mouth daily., Disp: 90 tablet, Rfl: 0   Azelastine  HCl 137 MCG/SPRAY SOLN, Place into both nostrils., Disp: , Rfl:    Blood Glucose Monitoring Suppl DEVI, 1 each by Does not apply route daily as needed. Include strips and lancets to test once daily.  Dx. Diabetes. May substitute to any manufacturer covered by patient's insurance., Disp: 1 each, Rfl: 0   buprenorphine (BUTRANS) 20 MCG/HR PTWK, Place 1 patch onto the skin once a week., Disp: , Rfl:    cefdinir  (OMNICEF ) 300 MG capsule, Take 1 capsule (300 mg total) by mouth 2 (two) times daily., Disp: 14 capsule, Rfl: 0   diclofenac  (VOLTAREN ) 75 MG EC tablet, Take 75 mg by mouth 2 (two) times daily., Disp: , Rfl:    doxycycline  (VIBRA -TABS) 100 MG tablet, Take 1 tablet (100 mg total) by  mouth 2 (two) times daily., Disp: 20 tablet, Rfl: 0   fluticasone  (FLONASE ) 50 MCG/ACT nasal spray, SPRAY 2 SPRAYS INTO EACH NOSTRIL EVERY DAY, Disp: 48 mL, Rfl: 1   folic acid  (FOLVITE ) 1 MG tablet, Take 1 mg by mouth daily., Disp: , Rfl:    furosemide  (LASIX ) 20 MG tablet, Take 20 mg by mouth every other day., Disp: , Rfl:    gabapentin  (NEURONTIN ) 600 MG tablet, TAKE 2 TABLETS (1,200 MG TOTAL) BY MOUTH AT BEDTIME. PATIENT TAKES 1200 MG DAILY, Disp: 180 tablet, Rfl: 1   ipratropium (ATROVENT ) 0.06 % nasal spray, Place 2 sprays into both nostrils 3 (three) times daily., Disp: 45 mL, Rfl: 11   ipratropium-albuterol  (DUONEB) 0.5-2.5 (3) MG/3ML SOLN, INHALE 3 MLS INTO THE LUNGS EVERY 2 (TWO) HOURS AS NEEDED (WHEEZING OR SHORTNESS OF BREATH)., Disp: 540 mL, Rfl: 6   levocetirizine (XYZAL ) 5 MG tablet, Take 5 mg by mouth., Disp: , Rfl:    levothyroxine  (SYNTHROID ) 88 MCG tablet, TAKE 1 TABLET BY MOUTH EVERY DAY, Disp: 90 tablet, Rfl: 1   losartan  (COZAAR ) 100 MG tablet, TAKE 1 TABLET BY MOUTH EVERY DAY, Disp: 90 tablet, Rfl: 1   pantoprazole  (PROTONIX ) 40 MG tablet, TAKE 1 TABLET BY MOUTH EVERY DAY, Disp: 90 tablet, Rfl: 3   promethazine  (PHENERGAN ) 25 MG tablet, Take 25 mg by mouth every 6 (six) hours as needed for nausea or vomiting., Disp: , Rfl:    rosuvastatin  (CRESTOR ) 20  MG tablet, Take 1 tablet (20 mg total) by mouth at bedtime., Disp: 90 tablet, Rfl: 3   Sodium Chloride -Sodium Bicarb 1.57 g PACK, Nasal saline irrigation nightly., Disp: 1 each, Rfl: PRN   tiZANidine  (ZANAFLEX ) 2 MG tablet, Take 1 tablet (2 mg total) by mouth 2 (two) times daily as needed for muscle spasms. Use sparingly., Disp: 45 tablet, Rfl: 0   traZODone  (DESYREL ) 50 MG tablet, Take 50 mg by mouth at bedtime as needed for sleep., Disp: , Rfl:    TRELEGY ELLIPTA  100-62.5-25 MCG/ACT AEPB, INHALE 1 PUFF BY MOUTH EVERY DAY, Disp: 180 each, Rfl: 1   Vilazodone  HCl (VIIBRYD ) 40 MG TABS, Take 40 mg by mouth daily., Disp: , Rfl:     Medications ordered in this encounter:  No orders of the defined types were placed in this encounter.    *If you need refills on other medications prior to your next appointment, please contact your pharmacy*  Follow-Up: Call back or seek an in-person evaluation if the symptoms worsen or if the condition fails to improve as anticipated.  [facility] [phone]  Other Instructions Report to nearest ER.    If you have been instructed to have an in-person evaluation today at a local Urgent Care facility, please use the link below. It will take you to a list of all of our available [facility], including address, phone number and hours of operation. Please do not delay care.  [facility]  If you or a family member do not have a primary care provider, use the link below to schedule a visit and establish care. When you choose a [facility] primary care physician or advanced practice provider, you gain a long-term partner in health. Find a Primary Care Provider  Learn more about [facility]'s in-office and virtual care options: [facility] - Get Care Now

## 2024-04-25 ENCOUNTER — Ambulatory Visit: Payer: Self-pay | Admitting: *Deleted

## 2024-04-25 ENCOUNTER — Encounter: Payer: Self-pay | Admitting: *Deleted

## 2024-04-25 DIAGNOSIS — M79661 Pain in right lower leg: Secondary | ICD-10-CM | POA: Diagnosis not present

## 2024-04-25 DIAGNOSIS — M25571 Pain in right ankle and joints of right foot: Secondary | ICD-10-CM | POA: Diagnosis not present

## 2024-04-25 NOTE — Telephone Encounter (Signed)
 Ortho did not think that pain meds were needed then we do not prescribe that here either.  She needs to follow-up with her pain management specialist. Do I write her pain meds

## 2024-04-25 NOTE — Telephone Encounter (Signed)
  Chief Complaint: leg pain- possible pulled muscle- patient is requesting Tramadol  offered at last appointment- but had to wait until after testing- no blood clot present Symptoms: unable to walk on that leg without extreme pain Frequency: Thursday  last week Pertinent Negatives: Patient denies chest pain, back pain, breathing difficulty, swelling, rash, fever, numbness, weakness Disposition: [] ED /[] Urgent Care (no appt availability in office) / [] Appointment(In office/virtual)/ []  [facility]/ [] Home Care/ [] Refused Recommended Disposition /[] [facility]/ [x]  Follow-up with PCP Additional Notes: Patient requesting pain medication- she states she has to be able to move due to her living circumstances.  Patient just left Ortho office- they are thinking pulled muscle- patient was not given anything due to steriod Rx.   Copied from CRM [id number]. Topic: Clinical - Red Word Triage >> Apr 25, 2024  3:33 PM Karole Pacer C wrote: Red Word that prompted transfer to Nurse Triage: Patient and ortho specialist believes she may have pulled a muscle. Patient would like to know if she could get something for pain. She has been taking motrin  and ibrupophen isn't helping Reason for Disposition  [1] SEVERE pain (e.g., excruciating, unable to do any normal activities) AND [2] not improved after 2 hours of pain medicine  Answer Assessment - Initial Assessment Questions 1. ONSET: "When did the pain start?"      2 weeks Thursday 2. LOCATION: "Where is the pain located?"      Outer R lower leg 3. PAIN: "How bad is the pain?"    (Scale 1-10; or mild, moderate, severe)   -  MILD (1-3): doesn't interfere with normal activities    -  MODERATE (4-7): interferes with normal activities (e.g., work or school) or awakens from sleep, limping    -  SEVERE (8-10): excruciating pain, unable to do any normal activities, unable to walk     Severe- 10/10- heating pad, muscle relaxer, ibuprofen - moving is  painful 4. WORK OR EXERCISE: "Has there been any recent work or exercise that involved this part of the body?"      Patient went through PT to learn to walk- patient is doing her exercises-may have pulled muscle- she is having pain 5. CAUSE: "What do you think is causing the leg pain?"     PT believes she has pulled - x- ray/US  normal 6. OTHER SYMPTOMS: "Do you have any other symptoms?" (e.g., chest pain, back pain, breathing difficulty, swelling, rash, fever, numbness, weakness)     no  Protocols used: Leg Pain-A-AH

## 2024-04-26 NOTE — Telephone Encounter (Signed)
 Spoke with patient. Stats she is scheduled for a spinal block  for 05/16/24. Pain management doctor would not give her any pain medications either but did not go into the reason for this. She states "just forget about it- no believes that I am in pain"

## 2024-04-29 ENCOUNTER — Other Ambulatory Visit: Payer: Self-pay | Admitting: Family Medicine

## 2024-04-29 DIAGNOSIS — J449 Chronic obstructive pulmonary disease, unspecified: Secondary | ICD-10-CM | POA: Diagnosis not present

## 2024-05-02 NOTE — Telephone Encounter (Signed)
 Rx not listed in active med list. New med request.

## 2024-05-02 NOTE — Telephone Encounter (Signed)
 We stopped this med. Actually was stopped in hospttal. We talked about the fact that this med is not longer usually for her

## 2024-05-04 ENCOUNTER — Telehealth: Payer: Self-pay

## 2024-05-04 NOTE — Transitions of Care (Post Inpatient/ED Visit) (Signed)
   05/04/2024  Name: Krystal Burgess MRN: [id number] DOB: September 20, 1961  Today's TOC FU Call Status: Today's TOC FU Call Status:: Successful TOC FU Call Completed TOC FU Call Complete Date: 05/04/24  Attempted to reach the patient regarding the most recent Inpatient/ED visit.  Follow Up Plan: No further outreach attempts will be made at this time. We have been unable to contact the patient. Patient states was no at ER yesterday [facility] Darrall Ellison, LPN [facility] Nurse Health Advisor Direct Dial [phone]

## 2024-05-09 ENCOUNTER — Ambulatory Visit: Payer: Self-pay

## 2024-05-09 NOTE — Telephone Encounter (Signed)
 Appointment tomorrow

## 2024-05-09 NOTE — Telephone Encounter (Signed)
  Chief Complaint: sinus symptoms Symptoms: pain, pressure, discharge, cough Frequency: intermittent Pertinent Negatives: Patient denies fever, difficulty breathing Disposition: [] ED /[] Urgent Care (no appt availability in office) / [] Appointment(In office/virtual)/ []  [facility]/ [] Home Care/ [] Refused Recommended Disposition /[] [facility]/ []  Follow-up with PCP Additional Notes:  History of sinus infections. Calling today to schedule appointment. Few days of sinus symptoms, today now having green discharge from nose. Facial pain and pressure. Intermittent cough from post nasal drip. Using nasal spray and otc sinus medicine with minimal effect. Acute evaluation advised. Requesting video visit. Next available acute video visit schedule with PCP on 05/10/24. Patient mentioned she has a video appointment on 05/17/24 for CPAP review and wondering if this can be done at acute visit tomorrow, advised she will likely need to have secondary appointment for CPAP but this writer will include in notes to pcp, patient verbalized understanding and is fine with two appointments if needed. Educated on care advice as documented in protocol, patient verbalized understanding. Discussed reasons to call back.     Copied from CRM [id number]. Topic: Appointments - Appointment Scheduling >> May 09, 2024 12:15 PM Kevelyn M wrote: Patient is experiencing congestion with green mucous and believes their sinuses are infected. Reason for Disposition  [place]  Protocols used: Sinus Pain or Congestion-A-AH

## 2024-05-10 ENCOUNTER — Telehealth (INDEPENDENT_AMBULATORY_CARE_PROVIDER_SITE_OTHER): Admitting: Family Medicine

## 2024-05-10 ENCOUNTER — Encounter: Payer: Self-pay | Admitting: Family Medicine

## 2024-05-10 DIAGNOSIS — J329 Chronic sinusitis, unspecified: Secondary | ICD-10-CM

## 2024-05-10 MED ORDER — DOXYCYCLINE HYCLATE 100 MG PO TABS: 100.0000 mg | ORAL_TABLET | Freq: Two times a day (BID) | ORAL | 0 refills | Status: DC

## 2024-05-10 NOTE — Progress Notes (Signed)
 Pt reports that her sxs started about 2 days ago.   Nasal congestion, sinus pressure. She stated that it has been draining down into her throat. She has been coughing up green mucus.   She denies any f/s/c  She has been taking some sinus medication. It has helped her some. She feels that she gets more stopped up when lying down on her side.

## 2024-05-10 NOTE — Progress Notes (Signed)
    Virtual Visit via Video Note  I connected with [name] on 05/10/24 at  1:00 PM EDT by a video enabled telemedicine application and verified that I am speaking with the correct person using two identifiers.   I discussed the limitations of evaluation and management by telemedicine and the availability of in person appointments. The patient expressed understanding and agreed to proceed.  Patient location: at home Provider location: in office  Subjective:    CC:   Chief Complaint  Patient presents with   Sinusitis    HPI: Couple of days of sinus congestion, some inc cough.  No fever or chills.  She is worried that it is going to get into her chest.  She was just treated for sinobronchitis about 4 weeks ago she said she did get completely better until a few days ago and started to get some new symptoms she has not been doing her nasal saline irrigation she has been doing her nasal sprays.    Past medical history, Surgical history, Family history not pertinant except as noted below, Social history, Allergies, and medications have been entered into the medical record, reviewed, and corrections made.    Objective:    General: Speaking clearly in complete sentences without any shortness of breath.  Alert and oriented x3.  Normal judgment. No apparent acute distress.    Impression and Recommendations:    Problem List Items Addressed This Visit       Respiratory   Recurrent sinusitis - Primary   Relevant Medications   doxycycline  (VIBRA -TABS) 100 MG tablet   Sinusitis-will treat with doxycycline .  Call if not improving after 1 week.  We discussed not using steroids at this point since she is not having any significant COPD flare.  But if that changes please let me know also reminded her that she needs to be consistent with a nasal saline irrigation it can make a big difference in reducing frequency of infections and also just clearing out the sinus cavities.  Make sure  to continue with fluticasone  nasal spray.  Does have an upcoming appointment for MRI for her back next week she still having a lot of pain down her leg that is making it difficult for her to be mobile she can walk but that it becomes so painful she really has to sit down and rest.  I reminded her to go to the lab when she can she is overdue for blood work that we had ordered back in March.  No orders of the defined types were placed in this encounter.   Meds ordered this encounter  Medications   doxycycline  (VIBRA -TABS) 100 MG tablet    Sig: Take 1 tablet (100 mg total) by mouth 2 (two) times daily.    Dispense:  14 tablet    Refill:  0    Can please provide nasal saline bottle wash     I discussed the assessment and treatment plan with the patient. The patient was provided an opportunity to ask questions and all were answered. The patient agreed with the plan and demonstrated an understanding of the instructions.   The patient was advised to call back or seek an in-person evaluation if the symptoms worsen or if the condition fails to improve as anticipated.   Duaine German, MD

## 2024-05-14 DIAGNOSIS — J449 Chronic obstructive pulmonary disease, unspecified: Secondary | ICD-10-CM | POA: Diagnosis not present

## 2024-05-14 DIAGNOSIS — J9620 Acute and chronic respiratory failure, unspecified whether with hypoxia or hypercapnia: Secondary | ICD-10-CM | POA: Diagnosis not present

## 2024-05-15 ENCOUNTER — Encounter: Payer: Self-pay | Admitting: Family Medicine

## 2024-05-15 DIAGNOSIS — J449 Chronic obstructive pulmonary disease, unspecified: Secondary | ICD-10-CM | POA: Diagnosis not present

## 2024-05-15 DIAGNOSIS — J9620 Acute and chronic respiratory failure, unspecified whether with hypoxia or hypercapnia: Secondary | ICD-10-CM | POA: Diagnosis not present

## 2024-05-17 ENCOUNTER — Telehealth: Admitting: Family Medicine

## 2024-05-17 ENCOUNTER — Encounter: Payer: Self-pay | Admitting: Family Medicine

## 2024-05-17 ENCOUNTER — Telehealth: Payer: Self-pay

## 2024-05-17 DIAGNOSIS — Z59811 Housing instability, housed, with risk of homelessness: Secondary | ICD-10-CM | POA: Diagnosis not present

## 2024-05-17 DIAGNOSIS — J329 Chronic sinusitis, unspecified: Secondary | ICD-10-CM

## 2024-05-17 MED ORDER — FLUCONAZOLE 150 MG PO TABS
150.0000 mg | ORAL_TABLET | Freq: Once | ORAL | 0 refills | Status: AC
Start: 2024-05-17 — End: 2024-05-17

## 2024-05-17 MED ORDER — AMOXICILLIN-POT CLAVULANATE 875-125 MG PO TABS: 1.0000 | ORAL_TABLET | Freq: Two times a day (BID) | ORAL | 0 refills | Status: DC

## 2024-05-17 MED ORDER — SODIUM CHLORIDE-SODIUM BICARB 1.57 G NA PACK: PACK | NASAL | 99 refills | Status: DC

## 2024-05-17 MED ORDER — PREDNISONE 20 MG PO TABS
ORAL_TABLET | ORAL | 0 refills | Status: AC
Start: 2024-05-17 — End: 2024-05-25

## 2024-05-17 NOTE — Progress Notes (Signed)
 Pt reports that she hasn't been using the CPAP due to sinus infection. She stated that she is unsure of the last time that she actually used her CPAP.  Pt sent a mychart message on 5/25 about the sinus infection moving down into her chest and that she has been coughing up green mucus. She stated that she will finish the ABX today. She said that she has been up since 230 AM and went back to bed around 730 this morning and that when I called she woke up.

## 2024-05-17 NOTE — Progress Notes (Addendum)
 Virtual Visit via Video Note  I connected with Krystal Burgess on 05/17/24 at 11:10 AM EDT by a video enabled telemedicine application and verified that I am speaking with the correct person using two identifiers.   I discussed the limitations of evaluation and management by telemedicine and the availability of in person appointments. The patient expressed understanding and agreed to proceed.  Patient location: at home Provider location: in office  Subjective:    CC:   Chief Complaint  Patient presents with   Medical Management of Chronic Issues    CPAP     HPI: Pt reports that she hasn't been using the CPAP due to sinus infection. She stated that she is unsure of the last time that she actually used her CPAP.   Pt sent a mychart message on 5/25 about the sinus infection moving down into her chest and that she has been coughing up green mucus. She stated that she will finish the ABX today. She said that she has been up since 230 AM and went back to bed around 730 this morning and that when I called she woke up.  She has been a unable to do the sinus rinses because she has not been able to get to the pharmacy to get the kit.  Last night she felt hot and then cold and feels like her sinuses are starting to burn.  She has also been living with a friend for several months since she was hospitalized.  But she is not going to be able to stay there for much longer and really has nowhere else to go she was living with her daughter previously but her daughter will not take her back into the home and in fact the courts are now involved.  Past medical history, Surgical history, Family history not pertinant except as noted below, Social history, Allergies, and medications have been entered into the medical record, reviewed, and corrections made.    Objective:    General: Speaking clearly in complete sentences without any shortness of breath.  Alert and oriented x3.  Normal judgment. No  apparent acute distress.    Impression and Recommendations:    Problem List Items Addressed This Visit       Respiratory   Recurrent sinusitis - Primary   Relevant Medications   amoxicillin -clavulanate (AUGMENTIN ) 875-125 MG tablet   fluconazole  (DIFLUCAN ) 150 MG tablet   Sodium Chloride -Sodium Bicarb 1.57 g PACK   predniSONE  (DELTASONE ) 20 MG tablet   Other Visit Diagnoses       Housing instability due to imminent risk of homelessness       Relevant Orders   AMB Referral VBCI Care Management       Orders Placed This Encounter  Procedures   AMB Referral VBCI Care Management    Referral Priority:   Routine    Referral Type:   Consultation    Referral Reason:   Care Coordination    Number of Visits Requested:   1    Meds ordered this encounter  Medications   amoxicillin -clavulanate (AUGMENTIN ) 875-125 MG tablet    Sig: Take 1 tablet by mouth 2 (two) times daily.    Dispense:  14 tablet    Refill:  0   fluconazole  (DIFLUCAN ) 150 MG tablet    Sig: Take 1 tablet (150 mg total) by mouth once for 1 dose.    Dispense:  2 tablet    Refill:  0   Sodium Chloride -Sodium Bicarb 1.57 g  PACK    Sig: Nasal saline irrigation nightly.    Dispense:  100 each    Refill:  PRN    Pt unable to walk into store. She is home bound.   predniSONE  (DELTASONE ) 20 MG tablet    Sig: Take 2 tablets (40 mg total) by mouth daily with breakfast for 4 days, THEN 1 tablet (20 mg total) daily with breakfast for 4 days.    Dispense:  12 tablet    Refill:  0   Recurrent sinusitis-and going to treat her with Augmentin  she just finished up her doxycycline  and had no response.  She does tend to get yeast infections with Augmentin  so I went ahead and sent over Diflucan  as well we will also add prednisone  since she feels like the infection is moving down into her chest.  It is difficult for her to get here for evaluation some also going to make a referral to the [facility] team to see if they can assist her with  housing and transportation which have been big barriers for her in regards to getting appropriate care.  I discussed the assessment and treatment plan with the patient. The patient was provided an opportunity to ask questions and all were answered. The patient agreed with the plan and demonstrated an understanding of the instructions.   The patient was advised to call back or seek an in-person evaluation if the symptoms worsen or if the condition fails to improve as anticipated.   Duaine German, MD

## 2024-05-17 NOTE — Progress Notes (Signed)
 Complex Care Management Note  Care Guide Note 05/17/2024 Name: MAYCE NOYES MRN: [id number] DOB: [birth year]  FANTASHIA SHUPERT is a 63 y.o. year old female who sees Metheney, Corita Diego, MD for primary care. I reached out to [name] by phone today to offer complex care management services.  Ms. Schreurs was given information about Complex Care Management services today including:   The Complex Care Management services include support from the care team which includes your Nurse Care Manager, Clinical Social Worker, or Pharmacist.  The Complex Care Management team is here to help remove barriers to the health concerns and goals most important to you. Complex Care Management services are voluntary, and the patient may decline or stop services at any time by request to their care team member.   Complex Care Management Consent Status: Patient agreed to services and verbal consent obtained.   Follow up plan:  Telephone appointment with complex care management team member scheduled for:  05-18-24  Encounter Outcome:  Patient Scheduled  Creola Doheny [facility], [facility] Guide  Direct Dial: [phone]  Fax [phone]

## 2024-05-18 ENCOUNTER — Telehealth: Payer: Self-pay

## 2024-05-18 ENCOUNTER — Encounter: Payer: Self-pay | Admitting: Family Medicine

## 2024-05-18 ENCOUNTER — Telehealth: Payer: Self-pay | Admitting: Licensed Clinical Social Worker

## 2024-05-18 DIAGNOSIS — J449 Chronic obstructive pulmonary disease, unspecified: Secondary | ICD-10-CM | POA: Diagnosis not present

## 2024-05-18 NOTE — Telephone Encounter (Signed)
 Krystal Burgess now states she has a ride to [facility] and will keep her MRI appointment.

## 2024-05-18 NOTE — Telephone Encounter (Signed)
 Copied from CRM [id number]. Topic: Appointments - Scheduling Inquiry for Clinic >> May 18, 2024 12:33 PM Shelby Dessert H wrote: Reason for CRM: Patient is calling to see if she can get her MRI at [facility] instead of [facility], could you please call her and assist please, her callback number is [phone]. She also has some additional questions about being able to get in and assistance with wheelchair.

## 2024-05-19 ENCOUNTER — Telehealth: Payer: Self-pay

## 2024-05-19 ENCOUNTER — Other Ambulatory Visit: Payer: Self-pay | Admitting: Licensed Clinical Social Worker

## 2024-05-19 NOTE — Telephone Encounter (Signed)
 Copied from CRM [id number]. Topic: General - Other >> May 19, 2024  8:06 AM Shelby Dessert H wrote: Reason for CRM: Patient was returning a missed call from Anthony in clinic, patient is wanting her to know that she believes only the lower part of her foot needs to be CT. She states that the top of her foot is the place that's causing her so many problems. Patients would like Shelvy Dickens to call her back so she can further explain what she is needing, patients callback number is [phone]. She has an MRI for tomorrow but she had to cancel due to transportation and not feeling well.

## 2024-05-19 NOTE — Patient Instructions (Signed)
 Visit Information  Thank you for taking time to visit with me today. Please don't hesitate to contact me if I can be of assistance to you before our next scheduled appointment.  Our next appointment is by telephone on 05/30/2024 at 1:00 pm Please call the care guide team at [phone] if you need to cancel or reschedule your appointment.   Following is a copy of your care plan:   Goals Addressed             This Visit's Progress    BSW VBCI Social Work Care Plan       Problems:   Depression  , Housing , and Stress  CSW Clinical Goal(s):   Over the next 1 weeks the Patient will will follow up with review the housing resources that were emailed as directed by Social Work.  Interventions:  SW will email housing resources  Patient Goals/Self-Care Activities:  Coordinate with DSS CM  to assist with Medicaid to see if she has the traditional and to see if she can be approved.  Plan:   Telephone follow up appointment with care management team member scheduled for:  05/30/2024 at 1:00 pm SW referred to [facility] for Depression and Stress and will be seen on 05/25/2024 at 3pm        Please call the Suicide and Crisis Lifeline: 988 go to [facility] [address], [place] [phone]) call 911 if you are experiencing a Mental Health or Behavioral Health Crisis or need someone to talk to.  Patient verbalizes understanding of instructions and care plan provided today and agrees to view in MyChart. Active MyChart status and patient understanding of how to access instructions and care plan via MyChart confirmed with patient.     Jonda Neighbours, PhD [facility], [facility] Social Worker Direct Dial: [phone]  Fax: [phone]

## 2024-05-19 NOTE — Patient Outreach (Signed)
 Complex Care Management   Visit Note  05/19/2024  Name:  Krystal Burgess MRN: [id number] DOB: Jan 10, 1961  Situation: Referral received for Complex Care Management related to SDOH Barriers:  Housing Depression an dStress Depression Stress I obtained verbal consent from Patient.  Visit completed with patient  on the phone  Background:   Past Medical History:  Diagnosis Date   Allergy    COPD (chronic obstructive pulmonary disease) (HCC)    Glaucoma    Hyperlipidemia    Hypertension    MRSA (methicillin resistant staph aureus) culture positive    Pain    Renal disorder    Thyroid  disease     Assessment: Patient is in need of housing asap, currently staying with a friend, was living with daughter, patient was really crying and stated that she was depressed and stressed and referral was mad to LCSW    Recommendation:   Referral to: LCSW appoint is 05/25/2024 at 3pm  Follow Up Plan:   Telephone follow up appointment date/time:  05/30/2024 at 1:00pm   Jonda Neighbours, PhD [facility], [facility] Social Worker Direct Dial: [phone]  Fax: [phone]

## 2024-05-21 ENCOUNTER — Other Ambulatory Visit: Payer: Self-pay | Admitting: Family Medicine

## 2024-05-21 DIAGNOSIS — E119 Type 2 diabetes mellitus without complications: Secondary | ICD-10-CM

## 2024-05-21 NOTE — Telephone Encounter (Signed)
 I didn't eval her for this.  Not sure who ordered original MRI but she need to talk to them about ordering at different location.  I am not trying to be difficult but I haven't even evaluated her leg to be able to request an MRI. I don't even know what I would be looking for.

## 2024-05-23 ENCOUNTER — Ambulatory Visit: Payer: Self-pay

## 2024-05-23 DIAGNOSIS — F17200 Nicotine dependence, unspecified, uncomplicated: Secondary | ICD-10-CM | POA: Diagnosis not present

## 2024-05-23 DIAGNOSIS — I129 Hypertensive chronic kidney disease with stage 1 through stage 4 chronic kidney disease, or unspecified chronic kidney disease: Secondary | ICD-10-CM | POA: Diagnosis not present

## 2024-05-23 DIAGNOSIS — T887XXA Unspecified adverse effect of drug or medicament, initial encounter: Secondary | ICD-10-CM | POA: Diagnosis not present

## 2024-05-23 DIAGNOSIS — E1122 Type 2 diabetes mellitus with diabetic chronic kidney disease: Secondary | ICD-10-CM | POA: Diagnosis not present

## 2024-05-23 DIAGNOSIS — Z5900 Homelessness unspecified: Secondary | ICD-10-CM | POA: Diagnosis not present

## 2024-05-23 DIAGNOSIS — Z888 Allergy status to other drugs, medicaments and biological substances status: Secondary | ICD-10-CM | POA: Diagnosis not present

## 2024-05-23 DIAGNOSIS — T50904A Poisoning by unspecified drugs, medicaments and biological substances, undetermined, initial encounter: Secondary | ICD-10-CM | POA: Diagnosis not present

## 2024-05-23 DIAGNOSIS — N183 Chronic kidney disease, stage 3 unspecified: Secondary | ICD-10-CM | POA: Diagnosis not present

## 2024-05-23 DIAGNOSIS — J449 Chronic obstructive pulmonary disease, unspecified: Secondary | ICD-10-CM | POA: Diagnosis not present

## 2024-05-23 DIAGNOSIS — F17210 Nicotine dependence, cigarettes, uncomplicated: Secondary | ICD-10-CM | POA: Diagnosis not present

## 2024-05-23 DIAGNOSIS — R6889 Other general symptoms and signs: Secondary | ICD-10-CM | POA: Diagnosis not present

## 2024-05-23 DIAGNOSIS — Z79899 Other long term (current) drug therapy: Secondary | ICD-10-CM | POA: Diagnosis not present

## 2024-05-23 DIAGNOSIS — Z881 Allergy status to other antibiotic agents status: Secondary | ICD-10-CM | POA: Diagnosis not present

## 2024-05-23 DIAGNOSIS — E785 Hyperlipidemia, unspecified: Secondary | ICD-10-CM | POA: Diagnosis not present

## 2024-05-23 DIAGNOSIS — E039 Hypothyroidism, unspecified: Secondary | ICD-10-CM | POA: Diagnosis not present

## 2024-05-23 NOTE — Telephone Encounter (Signed)
 Pt states that she is being asked to leave her current residence. Pt states that she is suicidal. Pt states that she cannot handle going to "a nut ward". Pt states that she needs to be admitted to a private room. Pt states that she would take all her pills. At time of call pt confirms numerous times that she has "only taken 1mg  of her nerve medication", which is the prescribed dose. She takes this for sciatic pain. Pt states that she has attempted to get county resources but was unable to for the county that she lives in, states "they can only offer me HH". Pt states that her current residence is her friend Audrey's house and [facility] will not allow her to have HH come to the residence. Pt states that [facility] "wants her out ASAP". Pt states that PCP needs to admit her to the hosp. Pt refusing to go to ED. 911 was called, RN stayed on line with pt until EMS arrived. Pt states physically she does have back pain, this is not new for her. Pt states that she has not been using the restroom as much as she normally does but states that she has not been drinking fluids like she should.  Copied from CRM [id number]. Topic: Clinical - Red Word Triage >> May 23, 2024 12:10 PM Blair Bumpers wrote: Red Word that prompted transfer to Nurse Triage: Patient called in stating that she received a message that she was on a waiting list. Checked chart & appt for 05/18/24 shows waitlisted for a referral to a psychiatrist & counseling. Patient states she just need some help and needs to speak with someone because she is suicidal. Reason for Disposition . Patient is threatening suicide now  Answer Assessment - Initial Assessment Questions 1. MAIN CONCERN: "What happened that made you call today?"     Eviction from her friends home 2. RISK OF HARM - SUICIDAL IDEATION:  "Do you ever have thoughts of hurting or killing yourself?"  (e.g., yes, no, no but preoccupation with thoughts about death)   - WISH TO BE DEAD:  "Have you wished you were  dead or wished you could go to sleep and not wake up?"   - INTENT:  "Have you had any thoughts of hurting or killing yourself?" (e.g., yes, no, N/A) If Yes, ask: "Are you having these thoughts about killing yourself right NOW?"   - PLAN: "Have you thought about how you might do this?" "Do you have a specific plan for how you would do this?" (e.g., gun, knife, overdose, no plan, N/A)   - ACCESS: If yes to PLAN, "Do you have access to pills?" (e.g., pills, gun in house, knife in kitchen)     Pt states that she would use pills, and has access to pills 5. 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 FUNCTIONAL IMPAIRMENT: "How have things been going for you overall? Have you had more difficulty than usual doing your normal daily activities?"  (e.g., better, same, worse; self-care, school, work, interactions)     Physically has had more back pain, cannot walk/perform tasks like she has in the past 7. SUPPORT: "Who is with you now?" "Who do you live with?" "Do you have family or friends who you can talk to?"  Pt states that she has no one 10. OTHER: "Do you have any other physical symptoms right now?" (e.g., fever)       Not urinating like normal, less frequent. Chronic back pain  Protocols used: Suicide Concerns-A-AH

## 2024-05-23 NOTE — Telephone Encounter (Signed)
I called patient. No answer.

## 2024-05-23 NOTE — Telephone Encounter (Signed)
 Sent My-Chart message

## 2024-05-23 NOTE — Telephone Encounter (Signed)
 Attempted call to pateint x 2 - automated system states "call can not be completed as dialed". Could not leave a voice mail message

## 2024-05-23 NOTE — Telephone Encounter (Signed)
 If you can try again to call before you leave for today. We do not have admitting privileges so if she thinks she needs to go to hospital she has to go through ED. Social work is being involved to try to sort out a place for her to stay.

## 2024-05-23 NOTE — Telephone Encounter (Signed)
 Called and left a message for a return call.

## 2024-05-25 ENCOUNTER — Other Ambulatory Visit: Payer: Self-pay | Admitting: Licensed Clinical Social Worker

## 2024-05-26 ENCOUNTER — Encounter: Payer: Self-pay | Admitting: Family Medicine

## 2024-05-26 ENCOUNTER — Telehealth: Payer: Self-pay

## 2024-05-26 ENCOUNTER — Other Ambulatory Visit: Payer: Self-pay | Admitting: Family Medicine

## 2024-05-26 NOTE — Telephone Encounter (Signed)
 Patient called states she is still in ER  States she has been evicted from her home and hospital social worker was trying to assist her in housing but as of yet has not found anything .  Patient states that if nothing is found they are talking about taking her to homeless shelter in [place]. She does not want to do this.  She also states that they have her medications all wrong.  Requesting we call and discuss this with [facility] . I suggested that I could fax over a medication list for her. She will find out the fax and call us  back to have the list faxed.

## 2024-05-27 ENCOUNTER — Telehealth: Payer: Self-pay | Admitting: *Deleted

## 2024-05-27 ENCOUNTER — Other Ambulatory Visit: Payer: Self-pay | Admitting: *Deleted

## 2024-05-27 DIAGNOSIS — J441 Chronic obstructive pulmonary disease with (acute) exacerbation: Secondary | ICD-10-CM

## 2024-05-27 DIAGNOSIS — Z59811 Housing instability, housed, with risk of homelessness: Secondary | ICD-10-CM

## 2024-05-27 DIAGNOSIS — F418 Other specified anxiety disorders: Secondary | ICD-10-CM

## 2024-05-27 DIAGNOSIS — F332 Major depressive disorder, recurrent severe without psychotic features: Secondary | ICD-10-CM

## 2024-05-27 NOTE — Telephone Encounter (Signed)
 Ana,  I have placed an [facility]RG[facility] referral for this patient. She is currently staying at [facility] in [place].

## 2024-05-27 NOTE — Telephone Encounter (Signed)
 Will work on it now

## 2024-05-30 ENCOUNTER — Other Ambulatory Visit: Payer: Self-pay | Admitting: Licensed Clinical Social Worker

## 2024-05-30 ENCOUNTER — Telehealth: Payer: Self-pay | Admitting: Family Medicine

## 2024-05-30 ENCOUNTER — Encounter: Payer: Self-pay | Admitting: Family Medicine

## 2024-05-30 NOTE — Patient Instructions (Signed)
 Visit Information  Thank you for taking time to visit with me today. Please don't hesitate to contact me if I can be of assistance to you before our next scheduled appointment.  Your next care management appointment is by telephone on 06/16/2024 at 1:00 pm   Please call the care guide team at [phone] if you need to cancel, schedule, or reschedule an appointment.   Please call the Suicide and Crisis Lifeline: 988 go to [facility] Urgent [facility] [address], [place] [phone]) call 911 if you are experiencing a Mental Health or Behavioral Health Crisis or need someone to talk to.  Jonda Neighbours, PhD [facility], [facility] Social Worker Direct Dial: [phone]  Fax: [phone]

## 2024-05-30 NOTE — Telephone Encounter (Signed)
 Plesa call pt this doesn't make sense.  That was 7 days ago.

## 2024-05-30 NOTE — Telephone Encounter (Signed)
 Copied from CRM [id number]. Topic: Clinical - Medication Refill >> May 30, 2024  7:46 AM Carrielelia G wrote: Medication was lost or left at the hospital, can not find it   Medication: amoxicillin -clavulanate  fluconazole   predniSONE      Has the patient contacted their pharmacy? No (Agent: If no, request that the patient contact the pharmacy for the refill. If patient does not wish to contact the pharmacy document the reason why and proceed with request.) (Agent: If yes, when and what did the pharmacy advise?)  This is the patient's preferred pharmacy:  [phone] [place] [place] - [address] [place]  [place] [address] Phone: [phone] Fax: [phone]   Is this the correct pharmacy for this prescription? Yes If no, delete pharmacy and type the correct one.   Has the prescription been filled recently? Yes  Is the patient out of the medication? Yes  Has the patient been seen for an appointment in the last year OR does the patient have an upcoming appointment? Yes  Can we respond through MyChart? Yes  Agent: Please be advised that Rx refills may take up to 3 business days. We ask that you follow-up with your pharmacy.

## 2024-05-30 NOTE — Patient Outreach (Signed)
 Complex Care Management   Visit Note  05/30/2024  Name:  Krystal Burgess MRN: [id number] DOB: November 23, 1961  Situation: Referral received for Complex Care Management related to SDOH Barriers:  Housing patient is currently in a hotel I obtained verbal consent from Patient.  Visit completed with patient  on the phone  Background:   Past Medical History:  Diagnosis Date   Allergy    COPD (chronic obstructive pulmonary disease) (HCC)    Glaucoma    Hyperlipidemia    Hypertension    MRSA (methicillin resistant staph aureus) culture positive    Pain    Renal disorder    Thyroid  disease     Assessment: Patient is currently staying at a hotel and has paid for 2 weeks, Patients friend is suppose to come and pick up patient next week and take her back to [place]  Recommendation:   none  Follow Up Plan:   Telephone follow up appointment date/time:  06/16/2024 at 1:00 pm  Jonda Neighbours, PhD [facility], [facility] Social Worker Direct Dial: [phone]  Fax: [phone]

## 2024-05-31 NOTE — Telephone Encounter (Signed)
 Spoke with patient states that [facility] had just  sent her her things form her home  but she does not have the medication that was prescribed by DR. Metheney for sinus infection - the abx, steroid or diflucan .she has not had these since leaving the hospital. She is still having symptoms of the sinus infection.   She states that she has no one to help her and once she checks out of the hotel she is is in ( she can no longer pay for this) she will be on the streets . She states she will die without her oxygen . She states she does not qualify for any type of assistance- she states her daughter Landa Pine is not speaking with her currently .

## 2024-06-01 ENCOUNTER — Other Ambulatory Visit: Payer: Self-pay

## 2024-06-01 ENCOUNTER — Other Ambulatory Visit: Payer: Self-pay | Admitting: Family Medicine

## 2024-06-01 ENCOUNTER — Telehealth: Payer: Self-pay | Admitting: Licensed Clinical Social Worker

## 2024-06-01 ENCOUNTER — Other Ambulatory Visit (HOSPITAL_COMMUNITY): Payer: Self-pay

## 2024-06-01 ENCOUNTER — Encounter: Payer: Self-pay | Admitting: Licensed Clinical Social Worker

## 2024-06-01 DIAGNOSIS — G629 Polyneuropathy, unspecified: Secondary | ICD-10-CM

## 2024-06-01 DIAGNOSIS — J4489 Other specified chronic obstructive pulmonary disease: Secondary | ICD-10-CM

## 2024-06-01 DIAGNOSIS — J329 Chronic sinusitis, unspecified: Secondary | ICD-10-CM

## 2024-06-01 DIAGNOSIS — E039 Hypothyroidism, unspecified: Secondary | ICD-10-CM

## 2024-06-01 DIAGNOSIS — J0100 Acute maxillary sinusitis, unspecified: Secondary | ICD-10-CM

## 2024-06-01 DIAGNOSIS — E119 Type 2 diabetes mellitus without complications: Secondary | ICD-10-CM

## 2024-06-01 DIAGNOSIS — K2100 Gastro-esophageal reflux disease with esophagitis, without bleeding: Secondary | ICD-10-CM

## 2024-06-01 MED ORDER — LEVOTHYROXINE SODIUM 88 MCG PO TABS
88.0000 ug | ORAL_TABLET | Freq: Every day | ORAL | 0 refills | Status: AC
  Filled 2024-06-01 – 2024-06-02 (×2): qty 30, 30d supply, fill #0

## 2024-06-01 MED ORDER — PANTOPRAZOLE SODIUM 40 MG PO TBEC
40.0000 mg | DELAYED_RELEASE_TABLET | Freq: Every day | ORAL | 3 refills | Status: DC
  Filled 2024-06-01 – 2024-07-20 (×2): qty 90, 90d supply, fill #0
  Filled 2024-09-19 – 2024-09-26 (×2): qty 90, 90d supply, fill #1

## 2024-06-01 MED ORDER — GABAPENTIN 600 MG PO TABS
600.0000 mg | ORAL_TABLET | Freq: Every day | ORAL | 1 refills | Status: DC
  Filled 2024-06-01: qty 90, 90d supply, fill #0

## 2024-06-01 MED ORDER — TIZANIDINE HCL 2 MG PO TABS
2.0000 mg | ORAL_TABLET | Freq: Two times a day (BID) | ORAL | 0 refills | Status: DC | PRN
  Filled 2024-06-01 – 2024-06-02 (×2): qty 45, 23d supply, fill #0

## 2024-06-01 MED ORDER — AMOXICILLIN-POT CLAVULANATE 875-125 MG PO TABS: 1.0000 | ORAL_TABLET | Freq: Two times a day (BID) | ORAL | 0 refills | Status: DC

## 2024-06-01 MED ORDER — IPRATROPIUM-ALBUTEROL 0.5-2.5 (3) MG/3ML IN SOLN
3.0000 mL | Freq: Four times a day (QID) | RESPIRATORY_TRACT | 1 refills | Status: DC | PRN
  Filled 2024-06-01 – 2024-07-20 (×2): qty 360, 30d supply, fill #0
  Filled 2024-08-08 – 2024-08-12 (×2): qty 360, 30d supply, fill #1

## 2024-06-01 MED ORDER — PREDNISONE 20 MG PO TABS: ORAL_TABLET | ORAL | 0 refills | Status: AC

## 2024-06-01 MED ORDER — LOSARTAN POTASSIUM 100 MG PO TABS
100.0000 mg | ORAL_TABLET | Freq: Every day | ORAL | 1 refills | Status: DC
  Filled 2024-06-01 – 2024-06-02 (×2): qty 90, 90d supply, fill #0

## 2024-06-01 MED ORDER — ROSUVASTATIN CALCIUM 20 MG PO TABS
20.0000 mg | ORAL_TABLET | Freq: Every evening | ORAL | 3 refills | Status: AC
  Filled 2024-06-01 – 2024-08-08 (×2): qty 90, 90d supply, fill #0

## 2024-06-01 MED ORDER — TRELEGY ELLIPTA 100-62.5-25 MCG/ACT IN AEPB
1.0000 | INHALATION_SPRAY | Freq: Every day | RESPIRATORY_TRACT | 1 refills | Status: DC
  Filled 2024-06-01 – 2024-07-20 (×2): qty 180, 90d supply, fill #0

## 2024-06-01 MED ORDER — IPRATROPIUM BROMIDE 0.06 % NA SOLN
2.0000 | Freq: Three times a day (TID) | NASAL | 11 refills | Status: DC
  Filled 2024-06-01 – 2024-08-30 (×2): qty 45, 75d supply, fill #0

## 2024-06-01 MED ORDER — FLUTICASONE PROPIONATE 50 MCG/ACT NA SUSP
1.0000 | Freq: Every day | NASAL | 1 refills | Status: DC
  Filled 2024-06-01: qty 32, 100d supply, fill #0

## 2024-06-01 NOTE — Progress Notes (Signed)
 Update her chart she is currently on gabapentin  with Dr. Rudin time I do not currently prescribe this medication for her so I did add it as a historical medication.

## 2024-06-01 NOTE — Telephone Encounter (Signed)
 Meds ordered this encounter  Medications   tiZANidine  (ZANAFLEX ) 2 MG tablet    Sig: Take 1 tablet (2 mg total) by mouth 2 (two) times daily as needed for muscle spasms. Use sparingly.    Dispense:  45 tablet    Refill:  0   rosuvastatin  (CRESTOR ) 20 MG tablet    Sig: TAKE 1 TABLET BY MOUTH EVERYDAY AT BEDTIME    Dispense:  90 tablet    Refill:  3   pantoprazole  (PROTONIX ) 40 MG tablet    Sig: Take 1 tablet (40 mg total) by mouth daily.    Dispense:  90 tablet    Refill:  3   losartan  (COZAAR ) 100 MG tablet    Sig: Take 1 tablet (100 mg total) by mouth daily.    Dispense:  90 tablet    Refill:  1   levothyroxine  (SYNTHROID ) 88 MCG tablet    Sig: Take 1 tablet (88 mcg total) by mouth daily.    Dispense:  30 tablet    Refill:  0    Needs labs   ipratropium-albuterol  (DUONEB) 0.5-2.5 (3) MG/3ML SOLN    Sig: Take 3 mLs by nebulization every 6 (six) hours as needed.    Dispense:  360 mL    Refill:  1   ipratropium (ATROVENT ) 0.06 % nasal spray    Sig: Place 2 sprays into both nostrils 3 (three) times daily.    Dispense:  45 mL    Refill:  11   gabapentin  (NEURONTIN ) 600 MG tablet    Sig: Take 1 tablet (600 mg total) by mouth at bedtime. Patient takes 1200 mg daily    Dispense:  90 tablet    Refill:  1   fluticasone  (FLONASE ) 50 MCG/ACT nasal spray    Sig: SPRAY 1 SPRAYS INTO EACH NOSTRIL EVERY DAY    Dispense:  48 mL    Refill:  1   Fluticasone -Umeclidin-Vilant (TRELEGY ELLIPTA ) 100-62.5-25 MCG/ACT AEPB    Sig: INHALE 1 PUFF BY MOUTH EVERY DAY    Dispense:  180 each    Refill:  1

## 2024-06-01 NOTE — Telephone Encounter (Signed)
 Patient requesting rx rf of all active meds to [place]  [facility]acy  Some prescription have refills( pantoprazole ,  rosuvastatin , and Trellegy)  but patient requesting change of pharmacy- O.k.to go ahead and send these to new requested pharmacy? Others requested  Flonase - Lat written 11/24/2023 Gabapentin  600mg  - last written 08/03/2023 Ipratopium nasal spray - last written 06/25/224 Ipratropium  duo neb - last written 10/28/2023 Levothyroxinge - last written 08/10/2023 Losartan  100mg  - last written 01/03/225 Tizanidine  2mg  - last written 03/07/2024 Last OV =05/17/2024 video visit Upcoming appt = 06/02/2024 hospital follow up

## 2024-06-01 NOTE — Telephone Encounter (Signed)
 Copied from CRM [id number]. Topic: Clinical - Prescription Issue >> May 31, 2024  4:52 PM [name] wrote: Reason for CRM: Patient called to advise that she needs all active medications sent to [facility] in [place]: [phone].   [address], [place], [place] [address].   At time of call, patient didn't have list of medication to confirm. Please assist.

## 2024-06-01 NOTE — Telephone Encounter (Signed)
 Augmentin  and prednisone  sent to pharmacy.  Has she spoken with the social worker?

## 2024-06-02 ENCOUNTER — Other Ambulatory Visit (HOSPITAL_COMMUNITY): Payer: Self-pay

## 2024-06-02 ENCOUNTER — Telehealth (INDEPENDENT_AMBULATORY_CARE_PROVIDER_SITE_OTHER): Admitting: Family Medicine

## 2024-06-02 ENCOUNTER — Other Ambulatory Visit: Payer: Self-pay

## 2024-06-02 ENCOUNTER — Encounter: Payer: Self-pay | Admitting: Family Medicine

## 2024-06-02 DIAGNOSIS — Z59819 Housing instability, housed unspecified: Secondary | ICD-10-CM | POA: Diagnosis not present

## 2024-06-02 DIAGNOSIS — F332 Major depressive disorder, recurrent severe without psychotic features: Secondary | ICD-10-CM | POA: Diagnosis not present

## 2024-06-02 DIAGNOSIS — J4489 Other specified chronic obstructive pulmonary disease: Secondary | ICD-10-CM | POA: Diagnosis not present

## 2024-06-02 NOTE — Assessment & Plan Note (Signed)
 Is adding significant acute distress to her.  She has been taking Xanax  which we have been very careful to limit because she is on chronic narcotics.

## 2024-06-02 NOTE — Assessment & Plan Note (Signed)
 Will try to reach out to the social worker to discuss what her options are.  If she is homeless she is not going to be able to have her oxygen  and take her medications.  I do not know if she would qualify for nursing home level of care as she is not able to provide self-care at this point.  Will reach out to them and see what the options are again I do not know if it is going to be possible for her to move back in with her daughter.

## 2024-06-02 NOTE — Progress Notes (Signed)
 Virtual Visit via Video Note  I connected with Krystal Burgess on 06/02/24 at  4:00 PM EDT by a video enabled telemedicine application and verified that I am speaking with the correct person using two identifiers.   I discussed the limitations of evaluation and management by telemedicine and the availability of in person appointments. The patient expressed understanding and agreed to proceed.  Patient location: at home Provider location: in office  Subjective:    CC:  No chief complaint on file.   HPI: She is currently really struggling.  She was staying with a friend after her last hospitalization her daughter had actually kicked her out of the house and attempted to get a restraining order though the court turned it out.  She is now living in a hotel room and only has enough money to stay there until Thursday.  She says the Child psychotherapist has been in touch with her to try to figure out some options for her they felt like the best option would be for her to be able to move back in with her daughter but I just do not think that that is probably going to happen at this point she is having significant mobility she has barely been able to walk she no longer drives.  She is on chronic oxygen  therapy because of advanced COPD.  She did go to the emergency department on June 2 for suicidal ideation after she had been evicted from her friend's house..  We did send in some of her medications yesterday to be delivered to the hotel room.  She was extremely tearful during the visit.  She has been using her alprazolam  because the only thing she says that keeps her from crying right now.  She says that her neurologist Dr. Isaac Manus time who also writes her pain medications is no longer going to see her virtually she has to come in in person and she can no longer drive and does not have transportation currently   Past medical history, Surgical history, Family history not pertinant except as noted below,  Social history, Allergies, and medications have been entered into the medical record, reviewed, and corrections made.    Objective:    General: Speaking clearly in complete sentences without any shortness of breath.  Alert and oriented x3.  Normal judgment. No apparent acute distress.    Impression and Recommendations:    Problem List Items Addressed This Visit       Respiratory   COPD (chronic obstructive pulmonary disease) with chronic bronchitis (HCC)   He is on daytime and nighttime oxygen  using her Trelegy.  We did for refill prescriptions for her.  Also I do not quite know what organ to do about the pain medications I do not prescribe those for her but the provider who has been seeing her is no longer willing to do virtual visit she does have to be seen in person so maybe we could arrange transportation.          Other   MDD (major depressive disorder), recurrent episode, severe (HCC)   Is adding significant acute distress to her.  She has been taking Xanax  which we have been very careful to limit because she is on chronic narcotics.      Housing insecurity - Primary   Will try to reach out to the social worker to discuss what her options are.  If she is homeless she is not going to be able to have her  oxygen  and take her medications.  I do not know if she would qualify for nursing home level of care as she is not able to provide self-care at this point.  Will reach out to them and see what the options are again I do not know if it is going to be possible for her to move back in with her daughter.       No orders of the defined types were placed in this encounter.   No orders of the defined types were placed in this encounter.    I discussed the assessment and treatment plan with the patient. The patient was provided an opportunity to ask questions and all were answered. The patient agreed with the plan and demonstrated an understanding of the instructions.   The patient  was advised to call back or seek an in-person evaluation if the symptoms worsen or if the condition fails to improve as anticipated.   Duaine German, MD

## 2024-06-02 NOTE — Assessment & Plan Note (Signed)
 He is on daytime and nighttime oxygen  using her Trelegy.  We did for refill prescriptions for her.  Also I do not quite know what organ to do about the pain medications I do not prescribe those for her but the provider who has been seeing her is no longer willing to do virtual visit she does have to be seen in person so maybe we could arrange transportation.

## 2024-06-03 ENCOUNTER — Telehealth: Payer: Self-pay | Admitting: Family Medicine

## 2024-06-03 NOTE — Telephone Encounter (Signed)
 Okay, I spoke with her yesterday afternoon around 5 and she told me she did not have anywhere to go as of Thursday that the money would run out for the hotel room so if she is already made arrangements that is great but as of yesterday afternoon she said she did not have anywhere to go

## 2024-06-03 NOTE — Telephone Encounter (Signed)
 Pt staying in hotel till Thursday. Needs help.  I saw f/ isn't scheduled for a month out but sill wil be homeless as of Thrusday and has oxygen  needs, etc

## 2024-06-06 NOTE — Telephone Encounter (Signed)
 Hi Kim, can we call the [facility] and see if they can reach out to her I had sent a note back to the initial social worker that it talk to her and let them know about her situation I think it had changed a little bit from the last time that they had spoken with her.  They really need to try to reach out to her today.  Please contact:  Jonda Neighbours, PhD [facility], [facility] Social Worker Direct Dial: [phone]  Fax: [phone]

## 2024-06-06 NOTE — Telephone Encounter (Signed)
 Called Krystal Burgess at number given. Left a voice mail message requesting a return call. ( Gave direct contact number for return call)

## 2024-06-07 ENCOUNTER — Encounter: Payer: Self-pay | Admitting: Family Medicine

## 2024-06-07 NOTE — Telephone Encounter (Signed)
 Again  attempted call to patient. Left another voice mail  requesting a return call ( also called and left voice mail requesting a return call this morning as well - on other message ) =asked that they contact us  asap.

## 2024-06-07 NOTE — Telephone Encounter (Signed)
 Again attempted call to carla- left a voice mail message requesting a return call.

## 2024-06-08 ENCOUNTER — Encounter: Payer: Self-pay | Admitting: Family Medicine

## 2024-06-08 ENCOUNTER — Encounter: Payer: Self-pay | Admitting: Licensed Clinical Social Worker

## 2024-06-08 NOTE — Patient Outreach (Signed)
 SW returned called to PCP's office and spoke to [employer] in regards to the patient. PCP was concerned that patient would be homeless by Thursday and the SW stated that when she spoke the patient last it was stated that the patient had paid for a 2 weeks stay and that her friend from [place] would be coming to pick her up this week. SW will reach out to follow up with patient

## 2024-06-09 NOTE — Telephone Encounter (Signed)
 Patient called to make sure her message was received. She stated that she needs all pending medications and the ones listed below to be sent to:  [facility] [address] [place] [address]  amoxicillin -clavulanate (AUGMENTIN ) 875-125 MG tablet predniSONE  (DELTASONE ) 20 MG tablet trazadone 50 mg vilazadone 40 mg  Patient would like these prescriptions delivered to the motel she is staying at  [facility] [address] [place] [address]  She stated that she would like to get 90 days supply of all medications if possible and that usually gets trazadone 50 mg and vilazadone 40 mg from her specialist but she just got out of the hospital and is not feeling well enough to go to see her specialist and cannot afford to do a virtual visit because it would cost $100.   Patient also would like to know if she can get an order for shower chair as soon as possible because she is not able to take a shower without it.   Callback: [phone]

## 2024-06-10 ENCOUNTER — Other Ambulatory Visit (HOSPITAL_COMMUNITY): Payer: Self-pay

## 2024-06-10 MED ORDER — VILAZODONE HCL 40 MG PO TABS
40.0000 mg | ORAL_TABLET | Freq: Every day | ORAL | 0 refills | Status: DC
  Filled 2024-06-10 – 2024-07-20 (×3): qty 90, 90d supply, fill #0

## 2024-06-10 MED ORDER — TRAZODONE HCL 50 MG PO TABS
50.0000 mg | ORAL_TABLET | Freq: Every evening | ORAL | 0 refills | Status: DC | PRN
  Filled 2024-06-10 (×2): qty 90, 90d supply, fill #0

## 2024-06-10 NOTE — Telephone Encounter (Signed)
 Trazodone  and thevilaxadone sent.  The Augmentin  and the prednisone  was already sent earlier in the week.

## 2024-06-10 NOTE — Telephone Encounter (Signed)
 Unfortunately I really do not have any other suggestions that so I really wanted social work to get involved because this is their area of expertise.

## 2024-06-10 NOTE — Telephone Encounter (Signed)
 Patient informed.

## 2024-06-14 ENCOUNTER — Other Ambulatory Visit (HOSPITAL_BASED_OUTPATIENT_CLINIC_OR_DEPARTMENT_OTHER): Payer: Self-pay

## 2024-06-14 DIAGNOSIS — J449 Chronic obstructive pulmonary disease, unspecified: Secondary | ICD-10-CM | POA: Diagnosis not present

## 2024-06-14 DIAGNOSIS — J9620 Acute and chronic respiratory failure, unspecified whether with hypoxia or hypercapnia: Secondary | ICD-10-CM | POA: Diagnosis not present

## 2024-06-14 NOTE — Telephone Encounter (Signed)
 Pt calling back wanting to speak with Luke about the Zofran  she requested.

## 2024-06-15 ENCOUNTER — Other Ambulatory Visit (HOSPITAL_COMMUNITY): Payer: Self-pay

## 2024-06-15 DIAGNOSIS — J449 Chronic obstructive pulmonary disease, unspecified: Secondary | ICD-10-CM | POA: Diagnosis not present

## 2024-06-15 DIAGNOSIS — J9620 Acute and chronic respiratory failure, unspecified whether with hypoxia or hypercapnia: Secondary | ICD-10-CM | POA: Diagnosis not present

## 2024-06-15 MED ORDER — ONDANSETRON 4 MG PO TBDP
4.0000 mg | ORAL_TABLET | Freq: Three times a day (TID) | ORAL | 0 refills | Status: DC | PRN
  Filled 2024-06-15: qty 20, 7d supply, fill #0

## 2024-06-15 NOTE — Telephone Encounter (Signed)
 Patient informed.

## 2024-06-15 NOTE — Telephone Encounter (Signed)
 Meds ordered this encounter  Medications   ondansetron (ZOFRAN-ODT) 4 MG disintegrating tablet    Sig: Take 1 tablet (4 mg total) by mouth every 8 (eight) hours as needed for nausea or vomiting.    Dispense:  20 tablet    Refill:  0

## 2024-06-15 NOTE — Telephone Encounter (Signed)
 I sent it today.  I sent it to [facility] order.

## 2024-06-16 ENCOUNTER — Other Ambulatory Visit: Payer: Self-pay | Admitting: Licensed Clinical Social Worker

## 2024-06-17 ENCOUNTER — Other Ambulatory Visit: Payer: Self-pay | Admitting: Licensed Clinical Social Worker

## 2024-06-18 ENCOUNTER — Other Ambulatory Visit: Payer: Self-pay | Admitting: Family Medicine

## 2024-06-18 DIAGNOSIS — E119 Type 2 diabetes mellitus without complications: Secondary | ICD-10-CM

## 2024-06-18 DIAGNOSIS — J449 Chronic obstructive pulmonary disease, unspecified: Secondary | ICD-10-CM | POA: Diagnosis not present

## 2024-06-20 NOTE — Telephone Encounter (Signed)
 I am not prescribing TID benzo while on chronic pain meds. I know we had talkin about 4 x risk of death with this combo.  I can rx a low dose to keep her from withdrawing but not 1mg  TID.  See what pharmacy

## 2024-06-21 ENCOUNTER — Other Ambulatory Visit (HOSPITAL_COMMUNITY): Payer: Self-pay

## 2024-06-21 MED ORDER — ALPRAZOLAM 0.5 MG PO TABS
0.5000 mg | ORAL_TABLET | Freq: Two times a day (BID) | ORAL | 1 refills | Status: DC | PRN
  Filled 2024-06-21 – 2024-07-20 (×2): qty 60, 30d supply, fill #0

## 2024-06-27 ENCOUNTER — Ambulatory Visit: Payer: Self-pay | Admitting: *Deleted

## 2024-06-27 NOTE — Telephone Encounter (Signed)
 FYI Only or Action Required?: Action required by provider: medication refill request.  Patient was last seen in primary care on 06/02/2024 by Alvan Dorothyann BIRCH, MD.  Called Nurse Triage reporting Medication Problem.  Symptoms began several weeks ago.  Interventions attempted: Prescription medications: Xanax .  Symptoms are: stable.  Triage Disposition: Call PCP Now  Patient/caregiver understands and will follow disposition?: Yes    Reason for Disposition  [1] Caller has URGENT medicine question about med that PCP or specialist prescribed AND [2] triager unable to answer question  Answer Assessment - Initial Assessment Questions 1. NAME of MEDICINE: What medicine(s) are you calling about?     Xanax  - patient is having to use increased dose of medication. Patient is homeless and her anxiety is increased. Patient can not afford to see her original provider due to transportation/cost. $100.00 for video visit 2. QUESTION: What is your question? (e.g., double dose of medicine, side effect)     Patient is requesting PCP call in Rx- patient states she is in better head space- not suicidal - patient is depressed- but needs medication to keep her calm.  3. PRESCRIBER: Who prescribed the medicine? Reason: if prescribed by specialist, call should be referred to that group.     [facility]- psychiatrist/back pain specialist  4. SYMPTOMS: Do you have any symptoms? If Yes, ask: What symptoms are you having?  How bad are the symptoms (e.g., mild, moderate, severe)      Patient has increased anxiety- patient requesting bridging Rx- Xanax  1mg  twice daily which is less than she was prescribed previously. Patient will need within the next week- she states she a few- but will run out- she is having to take 2 /0.5mg - she is using pain patch- no pills.Patient also wants to know if PCP will fill gabapentin  (NEURONTIN ) 600 MG tablet- 2hs and sometimes 1am- but not always  Protocols used: Medication  Question Call-A-AH   Copied from CRM [id number]. Topic: Clinical - Red Word Triage >> Jun 27, 2024  3:52 PM Zane F wrote: Red Word that prompted transfer to Nurse Triage:   Patient is calling in stating that she is taking 1mg  two to three a day; Patient is currently homeless and going through a lot and looking to have the dosage increased

## 2024-06-29 NOTE — Telephone Encounter (Signed)
 I am not prescribing any additional benzo.s eh really should be on them with her pain meds and other sedatnig medications which is why I didn't write them before.

## 2024-06-30 DIAGNOSIS — F17210 Nicotine dependence, cigarettes, uncomplicated: Secondary | ICD-10-CM | POA: Diagnosis not present

## 2024-06-30 DIAGNOSIS — J449 Chronic obstructive pulmonary disease, unspecified: Secondary | ICD-10-CM | POA: Diagnosis not present

## 2024-06-30 DIAGNOSIS — I499 Cardiac arrhythmia, unspecified: Secondary | ICD-10-CM | POA: Diagnosis not present

## 2024-06-30 DIAGNOSIS — R9431 Abnormal electrocardiogram [ECG] [EKG]: Secondary | ICD-10-CM | POA: Diagnosis not present

## 2024-06-30 DIAGNOSIS — Z743 Need for continuous supervision: Secondary | ICD-10-CM | POA: Diagnosis not present

## 2024-06-30 DIAGNOSIS — R079 Chest pain, unspecified: Secondary | ICD-10-CM | POA: Diagnosis not present

## 2024-06-30 DIAGNOSIS — F17200 Nicotine dependence, unspecified, uncomplicated: Secondary | ICD-10-CM | POA: Diagnosis not present

## 2024-06-30 DIAGNOSIS — R109 Unspecified abdominal pain: Secondary | ICD-10-CM | POA: Diagnosis not present

## 2024-06-30 DIAGNOSIS — R918 Other nonspecific abnormal finding of lung field: Secondary | ICD-10-CM | POA: Diagnosis not present

## 2024-06-30 DIAGNOSIS — R6889 Other general symptoms and signs: Secondary | ICD-10-CM | POA: Diagnosis not present

## 2024-07-02 ENCOUNTER — Other Ambulatory Visit: Payer: Self-pay | Admitting: Physician Assistant

## 2024-07-02 DIAGNOSIS — J0100 Acute maxillary sinusitis, unspecified: Secondary | ICD-10-CM

## 2024-07-05 ENCOUNTER — Encounter: Payer: Self-pay | Admitting: Family Medicine

## 2024-07-07 ENCOUNTER — Other Ambulatory Visit: Payer: Self-pay | Admitting: Licensed Clinical Social Worker

## 2024-07-07 NOTE — Patient Instructions (Signed)
 Visit Information  Thank you for taking time to visit with me today. Please don't hesitate to contact me if I can be of assistance to you before our next scheduled appointment.  Your next care management appointment is no further scheduled appointments.      Please call the care guide team at [phone] if you need to cancel, schedule, or reschedule an appointment.   Please call the Suicide and Crisis Lifeline: 988 call the [place]  National Suicide Prevention Lifeline: (229)584-5514 or TTY: 8183904920 TTY 920 266 6684) to talk to a trained counselor call 1-800-273-TALK (toll free, 24 hour hotline) call 911 if you are experiencing a Mental Health or Behavioral Health Crisis or need someone to talk to.  Krystal Burgess Krystal HEDWIG, PhD [facility], [facility] Social Worker Direct Dial: [phone]  Fax: [phone]

## 2024-07-07 NOTE — Patient Outreach (Signed)
 Complex Care Management   Visit Note  07/07/2024  Name:  Krystal Burgess MRN: [id number] DOB: June 06, 1961  Situation: Referral received for Complex Care Management related to SDOH Barriers:  Housing wants own place I obtained verbal consent from Patient.  Visit completed with patient  on the phone  Background:   Past Medical History:  Diagnosis Date   Allergy    COPD (chronic obstructive pulmonary disease) (HCC)    Glaucoma    Hyperlipidemia    Hypertension    MRSA (methicillin resistant staph aureus) culture positive    Pain    Renal disorder    Thyroid  disease     Assessment: Patient was very excited to state that she has moved into her own apartment on last Saturday and her friend form [place], [place] has been assisting her with unpacking boxes and getting thing situated. She is living in an apartment located [address] [place], [place] [address]. Patient stated that she can get also receive a caregiver daily from her insurance [facility]) and will be calling them at some point. Patient was a little sad because her daughter will not bring her grandson to see her but stated that she was going to continue to pray, but is happy  an a little nervous about living on her own. [place] is trying to contact her SW in [facility] DSS to get her Food stamps increased.    SDOH Interventions    Flowsheet Row Patient Outreach from 05/30/2024 in [place] POPULATION HEALTH DEPARTMENT Patient Outreach Telephone from 05/19/2024 in [place] POPULATION HEALTH DEPARTMENT Telephone from 01/20/2024 in [place] POPULATION HEALTH DEPARTMENT Telephone from 01/18/2024 in [place] POPULATION HEALTH DEPARTMENT Video Visit from 12/22/2023 in [facility] Telephone from 12/21/2023 in [place] POPULATION HEALTH DEPARTMENT  SDOH Interventions        Food Insecurity Interventions Intervention Not Indicated Intervention Not Indicated Intervention Not  Indicated Intervention Not Indicated -- Intervention Not Indicated  Housing Interventions Intervention Not Indicated Intervention Not Indicated  [SW will provide resources for housing] Intervention Not Indicated Intervention Not Indicated -- Intervention Not Indicated  Transportation Interventions -- Intervention Not Indicated Intervention Not Indicated Intervention Not Indicated -- Intervention Not Indicated  Utilities Interventions Intervention Not Indicated Intervention Not Indicated Intervention Not Indicated Intervention Not Indicated -- --  Depression Interventions/Treatment  -- -- -- -- Currently on Treatment --  Financial Strain Interventions Intervention Not Indicated Intervention Not Indicated -- -- -- --      Recommendation:   none  Follow Up Plan:   Closing From:  Complex Care Management  Tobias CHARM Maranda HEDWIG, PhD [facility], [facility] Social Worker Direct Dial: [phone]  Fax: [phone]

## 2024-07-08 ENCOUNTER — Telehealth: Payer: Self-pay

## 2024-07-08 ENCOUNTER — Other Ambulatory Visit (HOSPITAL_COMMUNITY): Payer: Self-pay

## 2024-07-08 ENCOUNTER — Ambulatory Visit: Payer: Self-pay

## 2024-07-08 ENCOUNTER — Telehealth: Admitting: Physician Assistant

## 2024-07-08 DIAGNOSIS — Z8709 Personal history of other diseases of the respiratory system: Secondary | ICD-10-CM

## 2024-07-08 DIAGNOSIS — R0602 Shortness of breath: Secondary | ICD-10-CM

## 2024-07-08 MED ORDER — PREDNISONE 20 MG PO TABS: ORAL_TABLET | ORAL | 0 refills | Status: AC

## 2024-07-08 NOTE — Telephone Encounter (Signed)
 Patient called - through E2C2  was told that patient had stated that she did the e-visit and the provider Darius would not prescribe an antibiotic - that he felt she needed to be seen face to face. E2C2 states patient was crying at time of call stating that she was in pain and needing the abx.  Patient is requesting a call back.

## 2024-07-08 NOTE — Telephone Encounter (Signed)
 Meds ordered this encounter  Medications   predniSONE  (DELTASONE ) 20 MG tablet    Sig: Take 2 tablets (40 mg total) by mouth daily with breakfast for 4 days, THEN 1 tablet (20 mg total) daily with breakfast for 4 days, THEN 0.5 tablets (10 mg total) daily with breakfast for 4 days.    Dispense:  14 tablet    Refill:  0    Please deliver

## 2024-07-08 NOTE — Telephone Encounter (Signed)
 Spoke w/pt and advised her to do an E-visit. She was very upset about doing this because she stated that Dr. Alvan has called in medications for her before for her. Pt became very irate and started yelling about the care that she was receiving.  She finally agreed to doing an E-visit I was able to walk her thru this process of how to schedule this.   She was able to get the E-visit done and was very grateful and stated that she would schedule a f/u my chart with Dr. Alvan.

## 2024-07-08 NOTE — Telephone Encounter (Signed)
 Unfortunately we do not have any options for visits today so I would recommend an e-visit since I suspect she still does not have options for transportation

## 2024-07-08 NOTE — Progress Notes (Signed)

## 2024-07-08 NOTE — Telephone Encounter (Signed)
 FYI Only or Action Required?: Action required by provider: requesting medication .  Patient was last seen in primary care on 06/02/2024 by Alvan Dorothyann BIRCH, MD.  Called Nurse Triage reporting Nasal Congestion.  Symptoms began several days ago.  Interventions attempted: OTC medications: Tylenol  sinus.  Symptoms are: gradually worsening.  Triage Disposition: Call PCP Now (overriding Home Care)  Patient/caregiver understands and will follow disposition?: Yes                             Copied from CRM [id number]. Topic: Clinical - Red Word Triage >> Jul 08, 2024  9:21 AM Merlynn LABOR wrote: Red Word that prompted transfer to Nurse Triage: Possible sinus infection, coughing, chest pain Reason for Disposition  [1] Sinus congestion as part of a cold AND [2] present < 10 days  Answer Assessment - Initial Assessment Questions 1. LOCATION: Where does it hurt?      Denies pain 2. ONSET: When did the sinus pain start?  (e.g., hours, days)      A few days  5. NASAL CONGESTION: Is the nose blocked? If Yes, ask: Can you open it or must you breathe through your mouth?     States she can breathe out of her nose, has COPD at baseline, states O2 is 99-100% at this time 6. NASAL DISCHARGE: Do you have discharge from your nose? If so ask, What color?     Green 7. FEVER: Do you have a fever? If Yes, ask: What is it, how was it measured, and when did it start?      Denies 8. OTHER SYMPTOMS: Do you have any other symptoms? (e.g., sore throat, cough, earache, difficulty breathing)     Mild cough, denies earache    States Tylenol  sinus provides minimal relief. Requesting antibiotic and steroid. States she does not have transportation at this time. No virtual visits available today. Please advise.  Protocols used: Sinus Pain or Congestion-A-AH

## 2024-07-08 NOTE — Telephone Encounter (Signed)
 Hi Kim, I did review the e-visit notes.  Since she is only had symptoms for 2 days I think we should hold off on antibiotics as a little too early.  I know she gets very sick very quickly and so I understand her concerns we could certainly try bumping up her prednisone  if she is noticed a little increased cough from some of the drainage and I know she has got COPD and maybe give it through the weekend with her saline nasal rinses twice a day and then using her Flonase  to try to get her sinuses nice and open try to stay in a cool environment if at all possible that can help a lot with congestion as well and if she is still not feeling better after the weekend then we can work her in on Monday for an e-visit with me.

## 2024-07-08 NOTE — Telephone Encounter (Signed)
 Forwarding to pcp for review and response

## 2024-07-08 NOTE — Telephone Encounter (Signed)
 [facility] could not deliver medication until Monday or later Requesting the prednisone  be sent to [name] and they will deliver to her today .

## 2024-07-08 NOTE — Telephone Encounter (Signed)
 Per Dr. Alvan   only has been 2 days of sinus symptoms- states she can bump up the prednisone  over the weekend to see if this helps and if symptoms worsen patient can call on Monday and Dr. Alvan would see if she could work her in for an e-visit.   Patient is very thankful for this and wanted to let Dr. Alvan know she appreciated it. She is agreeable to the Prednisone  being sent and  Requesting Either 20mg  or dose pack of  prednisone  to [facility] to be sent by delivery. ( She is currently calling the pharmacy to update her address with them)  she will call on Monday 07/11/24 if symptoms worsen to check to see  if can be seen for a virtual visit with DR. Metheney.

## 2024-07-13 ENCOUNTER — Telehealth (INDEPENDENT_AMBULATORY_CARE_PROVIDER_SITE_OTHER): Admitting: Family Medicine

## 2024-07-13 ENCOUNTER — Encounter: Payer: Self-pay | Admitting: Family Medicine

## 2024-07-13 DIAGNOSIS — G2581 Restless legs syndrome: Secondary | ICD-10-CM | POA: Diagnosis not present

## 2024-07-13 DIAGNOSIS — Z59819 Housing instability, housed unspecified: Secondary | ICD-10-CM | POA: Diagnosis not present

## 2024-07-13 DIAGNOSIS — F411 Generalized anxiety disorder: Secondary | ICD-10-CM

## 2024-07-13 NOTE — Assessment & Plan Note (Signed)
 On gabapentin . We did discuss possibility of decreasing her dose to find a dose that his helpful but less likely to cause side effects.

## 2024-07-13 NOTE — Assessment & Plan Note (Signed)
 Has been able to find a place to stay and is feeling positive. This is the first time she has every lived alone.

## 2024-07-13 NOTE — Assessment & Plan Note (Signed)
 Will refill xanax  for BID, I don't feel comfortable writing for TID.

## 2024-07-13 NOTE — Progress Notes (Signed)
 Pt would like to discuss getting a caregiver. She stated that she has spoken to her in

## 2024-07-13 NOTE — Progress Notes (Signed)
    Virtual Visit via Video Note  I connected with Krystal Burgess on 07/13/24 at 11:30 AM EDT by a video enabled telemedicine application and verified that I am speaking with the correct person using two identifiers.   I discussed the limitations of evaluation and management by telemedicine and the availability of in person appointments. The patient expressed understanding and agreed to proceed.  Patient location: at home Provider location: in office  Subjective:    CC:  No chief complaint on file.   HPI:  She was finally able to find an apt and has a friend who helped her move in. She is working to see if she qualifies for Medicaid  She cannot see her neurologist or psychiatric bc of large copay and wanted me to know if I can fill the xanax .    She is worried about her gabapentin . She had a friend tell her she should come off iof it. She takes 2 at bedtime.    Still struggling with legs but trying to walk some.   She would like to see if cna get some personal assistance to help her with bathing, ADLs, etc. She has been working to lose some weight as well.    Past medical history, Surgical history, Family history not pertinant except as noted below, Social history, Allergies, and medications have been entered into the medical record, reviewed, and corrections made.    Objective:    General: Speaking clearly in complete sentences without any shortness of breath.  Alert and oriented x3.  Normal judgment. No apparent acute distress.   Impression and Recommendations:    Problem List Items Addressed This Visit       Other   RLS (restless legs syndrome) - Primary   On gabapentin . We did discuss possibility of decreasing her dose to find a dose that his helpful but less likely to cause side effects.      Housing insecurity   Has been able to find a place to stay and is feeling positive. This is the first time she has every lived alone.        GAD (generalized anxiety  disorder)   Will refill xanax  for BID, I don't feel comfortable writing for TID.        In regards to [facility] she will need to contact them and find out what forms, etc she needs to have completed.   No orders of the defined types were placed in this encounter.   No orders of the defined types were placed in this encounter.    I discussed the assessment and treatment plan with the patient. The patient was provided an opportunity to ask questions and all were answered. The patient agreed with the plan and demonstrated an understanding of the instructions.   The patient was advised to call back or seek an in-person evaluation if the symptoms worsen or if the condition fails to improve as anticipated.   [name], MD

## 2024-07-14 DIAGNOSIS — J9620 Acute and chronic respiratory failure, unspecified whether with hypoxia or hypercapnia: Secondary | ICD-10-CM | POA: Diagnosis not present

## 2024-07-14 DIAGNOSIS — J449 Chronic obstructive pulmonary disease, unspecified: Secondary | ICD-10-CM | POA: Diagnosis not present

## 2024-07-15 DIAGNOSIS — J9620 Acute and chronic respiratory failure, unspecified whether with hypoxia or hypercapnia: Secondary | ICD-10-CM | POA: Diagnosis not present

## 2024-07-15 DIAGNOSIS — J449 Chronic obstructive pulmonary disease, unspecified: Secondary | ICD-10-CM | POA: Diagnosis not present

## 2024-07-18 ENCOUNTER — Encounter: Payer: Self-pay | Admitting: Family Medicine

## 2024-07-18 DIAGNOSIS — J449 Chronic obstructive pulmonary disease, unspecified: Secondary | ICD-10-CM | POA: Diagnosis not present

## 2024-07-18 NOTE — Telephone Encounter (Signed)
 Please see mychart message sent by pt and advise.

## 2024-07-20 ENCOUNTER — Other Ambulatory Visit: Payer: Self-pay

## 2024-07-20 ENCOUNTER — Other Ambulatory Visit: Payer: Self-pay | Admitting: Family Medicine

## 2024-07-20 ENCOUNTER — Other Ambulatory Visit (HOSPITAL_COMMUNITY): Payer: Self-pay

## 2024-07-20 ENCOUNTER — Encounter: Payer: Self-pay | Admitting: Family Medicine

## 2024-07-20 NOTE — Telephone Encounter (Signed)
 Not sending in a higher dose.  I do not feel comfortable with that we had talked about it couple years ago which is why she ended up seeing a specialist because of the safety issues around the sedation especially with her breathing problems and mixing with her other medications I am happy to continue with the 0.5 twice a day but if she feels like she needs more than that then she will have to consult with a specialist

## 2024-07-24 ENCOUNTER — Other Ambulatory Visit: Payer: Self-pay | Admitting: Family Medicine

## 2024-07-25 ENCOUNTER — Other Ambulatory Visit (HOSPITAL_COMMUNITY): Payer: Self-pay

## 2024-07-25 ENCOUNTER — Other Ambulatory Visit: Payer: Self-pay

## 2024-07-25 ENCOUNTER — Telehealth (INDEPENDENT_AMBULATORY_CARE_PROVIDER_SITE_OTHER): Admitting: Family Medicine

## 2024-07-25 ENCOUNTER — Other Ambulatory Visit (HOSPITAL_BASED_OUTPATIENT_CLINIC_OR_DEPARTMENT_OTHER): Payer: Self-pay

## 2024-07-25 ENCOUNTER — Encounter: Payer: Self-pay | Admitting: Family Medicine

## 2024-07-25 DIAGNOSIS — R197 Diarrhea, unspecified: Secondary | ICD-10-CM

## 2024-07-25 DIAGNOSIS — J019 Acute sinusitis, unspecified: Secondary | ICD-10-CM

## 2024-07-25 DIAGNOSIS — F411 Generalized anxiety disorder: Secondary | ICD-10-CM | POA: Diagnosis not present

## 2024-07-25 DIAGNOSIS — J4489 Other specified chronic obstructive pulmonary disease: Secondary | ICD-10-CM | POA: Diagnosis not present

## 2024-07-25 MED ORDER — PREDNISONE 10 MG (21) PO TBPK
ORAL_TABLET | ORAL | 0 refills | Status: DC
  Filled 2024-07-25: qty 21, 6d supply, fill #0

## 2024-07-25 MED ORDER — ALPRAZOLAM 1 MG PO TABS
1.0000 mg | ORAL_TABLET | Freq: Two times a day (BID) | ORAL | 0 refills | Status: DC | PRN
Start: 2024-07-25 — End: 2024-08-30
  Filled 2024-07-25 (×2): qty 60, 30d supply, fill #0

## 2024-07-25 MED ORDER — DOXYCYCLINE HYCLATE 100 MG PO TABS
100.0000 mg | ORAL_TABLET | Freq: Two times a day (BID) | ORAL | 0 refills | Status: DC
  Filled 2024-07-25: qty 14, 7d supply, fill #0

## 2024-07-25 MED ORDER — TRELEGY ELLIPTA 100-62.5-25 MCG/ACT IN AEPB
1.0000 | INHALATION_SPRAY | Freq: Every day | RESPIRATORY_TRACT | 1 refills | Status: DC
  Filled 2024-07-25 – 2024-08-30 (×3): qty 180, 90d supply, fill #0

## 2024-07-25 NOTE — Telephone Encounter (Signed)
 I have never written this medication for her.  What is she take it for?  And who was the original prescriber?

## 2024-07-25 NOTE — Assessment & Plan Note (Signed)
 Says her anxiety levels have been very high since she moved into her own apartment she just feels alone and she does not have any connections with other people.  She still very upset about not being in communication with her daughter.  She will likely run out of pain medication at the end of the month.  As they are requiring 100 all her copayment for her to do virtual visits.  She has been having panic attacks.  She was previously prescribed alprazolam  1 mg.  I had filled in the prescription using 0.5 just so that she did not withdraw as there is a contraindication between being on chronic narcotics and chronic benzodiazepines.  For short-term we will bump up to 1 mg twice a day since she is having frequent panic attacks.  Really encouraged her to try to get connected with a therapist.  Encouraged her to reach out to her insurance to see if they may have a company that they work with for virtual visits.

## 2024-07-25 NOTE — Progress Notes (Signed)
 Virtual Visit via Video Note  I connected with Krystal Burgess on 07/25/24 at  8:10 AM EDT by a video enabled telemedicine application and verified that I am speaking with the correct person using two identifiers.   I discussed the limitations of evaluation and management by telemedicine and the availability of in person appointments. The patient expressed understanding and agreed to proceed.  Patient location: at home Provider location: in office  Subjective:    CC:   Chief Complaint  Patient presents with   Sinusitis    Pt reports that she woke this morning and she cleared her throat and coughed up a large green hunk. She states that her O2 dropped 2 points. She was at 100% she is now at 98%. But whenever she moves around it will go lower.     HPI: She said she has had sinus symptoms and congestion for about 3 to 4 days.  But this morning woke up and felt like it was worse she tried to clear her throat and got a large chunk of green mucus.  She has been trying to you do the nasal saline and her nasal sprays.  Does wear oxygen  and says that she has had a dip in her oxygen  levels especially with activity.  She has been more short of breath.  She has not had a significant increase in cough.  Just does not feel well overall.  She is misplaced her Trelegy so she said she cannot find that she thinks it might be in a box.  And so needs a new one sent to the pharmacy.   Past medical history, Surgical history, Family history not pertinant except as noted below, Social history, Allergies, and medications have been entered into the medical record, reviewed, and corrections made.    Objective:    General: Speaking clearly in complete sentences without any shortness of breath.  Alert and oriented x3.  Normal judgment. No apparent acute distress.    Impression and Recommendations:    Problem List Items Addressed This Visit       Respiratory   COPD (chronic obstructive pulmonary  disease) with chronic bronchitis (HCC)   Relevant Medications   Fluticasone -Umeclidin-Vilant (TRELEGY ELLIPTA ) 100-62.5-25 MCG/ACT AEPB   doxycycline  (VIBRA -TABS) 100 MG tablet   predniSONE  (STERAPRED UNI-PAK 21 TAB) 10 MG (21) TBPK tablet     Other   GAD (generalized anxiety disorder)   Says her anxiety levels have been very high since she moved into her own apartment she just feels alone and she does not have any connections with other people.  She still very upset about not being in communication with her daughter.  She will likely run out of pain medication at the end of the month.  As they are requiring 100 all her copayment for her to do virtual visits.  She has been having panic attacks.  She was previously prescribed alprazolam  1 mg.  I had filled in the prescription using 0.5 just so that she did not withdraw as there is a contraindication between being on chronic narcotics and chronic benzodiazepines.  For short-term we will bump up to 1 mg twice a day since she is having frequent panic attacks.  Really encouraged her to try to get connected with a therapist.  Encouraged her to reach out to her insurance to see if they may have a company that they work with for virtual visits.      Relevant Medications   ALPRAZolam  (XANAX )  1 MG tablet   Other Relevant Orders   Ambulatory referral to Behavioral Health   Other Visit Diagnoses       Acute non-recurrent sinusitis, unspecified location    -  Primary   Relevant Medications   doxycycline  (VIBRA -TABS) 100 MG tablet   predniSONE  (STERAPRED UNI-PAK 21 TAB) 10 MG (21) TBPK tablet      Acute sinusitis with COPD exacerbation-will treat with doxycycline  and prednisone  taper.  If not feeling better after the next week please let us  know.  Will send new prescription for Trelegy hopefully they will cover it but it might be too early for them to fill requested delivery on her medications since she does not currently have any transportation  options.  Orders Placed This Encounter  Procedures   Ambulatory referral to Behavioral Health    Referral Priority:   Routine    Referral Type:   Psychiatric    Referral Reason:   Specialty Services Required    Requested Specialty:   Behavioral Health    Number of Visits Requested:   1    Meds ordered this encounter  Medications   Fluticasone -Umeclidin-Vilant (TRELEGY ELLIPTA ) 100-62.5-25 MCG/ACT AEPB    Sig: Inhale 1 puff into the lungs daily.    Dispense:  180 each    Refill:  1    Please deliver   doxycycline  (VIBRA -TABS) 100 MG tablet    Sig: Take 1 tablet (100 mg total) by mouth 2 (two) times daily.    Dispense:  14 tablet    Refill:  0    Please deliver   predniSONE  (STERAPRED UNI-PAK 21 TAB) 10 MG (21) TBPK tablet    Sig: Take as directed per package instructions.    Dispense:  21 tablet    Refill:  0    Please deliver   ALPRAZolam  (XANAX ) 1 MG tablet    Sig: Take 1 tablet (1 mg total) by mouth 2 (two) times daily as needed for anxiety.    Dispense:  60 tablet    Refill:  0    Please deliver     I discussed the assessment and treatment plan with the patient. The patient was provided an opportunity to ask questions and all were answered. The patient agreed with the plan and demonstrated an understanding of the instructions.   The patient was advised to call back or seek an in-person evaluation if the symptoms worsen or if the condition fails to improve as anticipated.   [name], MD

## 2024-07-26 ENCOUNTER — Other Ambulatory Visit: Payer: Self-pay

## 2024-07-26 NOTE — Telephone Encounter (Signed)
 Checked patient chart and found requested medication In past history  Patient requesting rx rf of Lomotil  Last written 01/18/2024 not showing in current medication list Last OV 07/25/2024 sick visit - sinusitis- telemedicine Upcoming appt 09/01/2024 AWV

## 2024-07-27 ENCOUNTER — Other Ambulatory Visit (HOSPITAL_COMMUNITY): Payer: Self-pay

## 2024-07-27 MED ORDER — DIPHENOXYLATE-ATROPINE 2.5-0.025 MG PO TABS
1.0000 | ORAL_TABLET | Freq: Two times a day (BID) | ORAL | 1 refills | Status: AC | PRN
  Filled 2024-07-27: qty 30, 15d supply, fill #0

## 2024-07-31 ENCOUNTER — Encounter: Payer: Self-pay | Admitting: Family Medicine

## 2024-08-02 ENCOUNTER — Encounter: Payer: Self-pay | Admitting: Family Medicine

## 2024-08-03 NOTE — Telephone Encounter (Signed)
 I am pretty sure we filled this out like a week ago did not we?  I know we filled how to do the power questionnaire for somebody I feel like it was her.  Can you verify that.

## 2024-08-03 NOTE — Telephone Encounter (Signed)
 Yes please give her number for [facility]

## 2024-08-04 ENCOUNTER — Other Ambulatory Visit (HOSPITAL_COMMUNITY): Payer: Self-pay

## 2024-08-07 ENCOUNTER — Encounter: Payer: Self-pay | Admitting: Family Medicine

## 2024-08-08 ENCOUNTER — Other Ambulatory Visit: Payer: Self-pay

## 2024-08-08 ENCOUNTER — Other Ambulatory Visit: Payer: Self-pay | Admitting: Family Medicine

## 2024-08-08 ENCOUNTER — Other Ambulatory Visit (HOSPITAL_COMMUNITY): Payer: Self-pay

## 2024-08-09 ENCOUNTER — Other Ambulatory Visit: Payer: Self-pay

## 2024-08-09 ENCOUNTER — Other Ambulatory Visit (HOSPITAL_COMMUNITY): Payer: Self-pay

## 2024-08-12 ENCOUNTER — Other Ambulatory Visit: Payer: Self-pay

## 2024-08-12 ENCOUNTER — Ambulatory Visit: Payer: Self-pay

## 2024-08-12 ENCOUNTER — Other Ambulatory Visit (HOSPITAL_COMMUNITY): Payer: Self-pay

## 2024-08-12 MED ORDER — TIZANIDINE HCL 2 MG PO TABS
2.0000 mg | ORAL_TABLET | Freq: Two times a day (BID) | ORAL | 0 refills | Status: DC | PRN
  Filled 2024-08-12: qty 45, 23d supply, fill #0

## 2024-08-12 NOTE — Telephone Encounter (Signed)
 Needs appt.  Thre is already another note, so clsoing this one.

## 2024-08-12 NOTE — Telephone Encounter (Signed)
 FYI Only or Action Required?: Action required by provider: request for appointment and medication refill request. Requests for video visit today in practice not urgent care  Patient was last seen in primary care on 07/25/2024 by Alvan Dorothyann BIRCH, MD.  Called Nurse Triage reporting Sinusitis.  Symptoms began yesterday.  Interventions attempted: OTC medications: saline nasal spray and Rest, hydration, or home remedies.  Symptoms are: rapidly worsening.  Triage Disposition: See PCP When Office is Open (Within 3 Days)  Patient/caregiver understands and will follow disposition?: No, wishes to speak with PCP  Copied from CRM #[id number]. Topic: Clinical - Red Word Triage >> Aug 12, 2024  1:30 PM Merlynn A wrote: [facility] that prompted transfer to Nurse Triage: Sinus infection/bleeding when blowing nose/thickness with nasal drainage Reason for Disposition  Lots of coughing  Answer Assessment - Initial Assessment Questions 1. LOCATION: Where does it hurt?      Sinuses  2. ONSET: When did the sinus pain start?  (e.g., hours, days)      08/10/24 3. SEVERITY: How bad is the pain?   (Scale 0-10; or none, mild, moderate or severe)     Severe 4. RECURRENT SYMPTOM: Have you ever had sinus problems before? If Yes, ask: When was the last time? and What happened that time?      Yes-frequent 5. NASAL CONGESTION: Is the nose blocked? If Yes, ask: Can you open it or must you breathe through your mouth?     yes 6. NASAL DISCHARGE: Do you have discharge from your nose? If so ask, What color?     Blood tinged, very thick, light green 7. FEVER: Do you have a fever? If Yes, ask: What is it, how was it measured, and when did it start?      denies 8. OTHER SYMPTOMS: Do you have any other symptoms? (e.g., sore throat, cough, earache, difficulty breathing)     Cough   Additional info: 1) On chronic o2, ,managing well.  2) No acute appointments available today, she declines urgent  care.  She is requesting steroid and doxycycline  antibiotic called into [facility] [address] As Dr. Alvan has done in the past. She would like to avoid ending in emergency room this weekend. If not prescribed today she states she will call 911 to go to emergency room. Please call patient either way.  Protocols used: Sinus Pain or Congestion-A-AH

## 2024-08-12 NOTE — Telephone Encounter (Signed)
 Needs appt

## 2024-08-12 NOTE — Telephone Encounter (Signed)
 Requesting rx rf of tizandidine 2mg  Last written 06/01/2024 Last OV 07/13/2024 telelmedicine Upcoming appt 09/01/2024 tele-medicare wellness

## 2024-08-14 DIAGNOSIS — J449 Chronic obstructive pulmonary disease, unspecified: Secondary | ICD-10-CM | POA: Diagnosis not present

## 2024-08-14 DIAGNOSIS — J9620 Acute and chronic respiratory failure, unspecified whether with hypoxia or hypercapnia: Secondary | ICD-10-CM | POA: Diagnosis not present

## 2024-08-15 ENCOUNTER — Other Ambulatory Visit (HOSPITAL_COMMUNITY): Payer: Self-pay

## 2024-08-15 ENCOUNTER — Other Ambulatory Visit: Payer: Self-pay | Admitting: *Deleted

## 2024-08-15 ENCOUNTER — Telehealth: Payer: Self-pay

## 2024-08-15 DIAGNOSIS — J449 Chronic obstructive pulmonary disease, unspecified: Secondary | ICD-10-CM | POA: Diagnosis not present

## 2024-08-15 NOTE — Telephone Encounter (Signed)
 Message sent to patient via Mychart.  I have spoken with [facility] and was told that this prescription ( Tizanidine  )  is currently in transit to your address. He states the prescription should arrive this afternoon. He states that if the script is stopped now that there is not a guarantee that it would not be delivered as it has left their office. He  would not recommend attempting to cancel the delivery at this point.The next refill request could be made to the new pharmacy. The updated pharmacy has already been added  to your chart.  Please let me know what you decide to do .

## 2024-08-15 NOTE — Telephone Encounter (Signed)
 Last read by Catheline W Ezzell [name] at 11:17AM on 08/15/2024.

## 2024-08-15 NOTE — Telephone Encounter (Signed)
 This has to come from pain mgt.    We can call there office and see what her options are but I don't write for this.   Maybe they can recommend another provider for her to see

## 2024-08-15 NOTE — Telephone Encounter (Signed)
 Copied from CRM [id number]. Topic: Clinical - Prescription Issue >> Aug 12, 2024  4:12 PM Merlynn A wrote: Reason for CRM: Patient called in requesting that medication tiZANidine  (ZANAFLEX ) 2 MG [facility]. Please send to requested pharmacy for patient as patient is not able to obtain medication from this pharmacy location at this time. Patient is [phone] [place] [place] - [address] for future references.

## 2024-08-16 ENCOUNTER — Encounter: Payer: Self-pay | Admitting: Physician Assistant

## 2024-08-16 ENCOUNTER — Telehealth (INDEPENDENT_AMBULATORY_CARE_PROVIDER_SITE_OTHER): Admitting: Physician Assistant

## 2024-08-16 ENCOUNTER — Encounter: Payer: Self-pay | Admitting: Family Medicine

## 2024-08-16 DIAGNOSIS — F332 Major depressive disorder, recurrent severe without psychotic features: Secondary | ICD-10-CM

## 2024-08-16 DIAGNOSIS — J329 Chronic sinusitis, unspecified: Secondary | ICD-10-CM | POA: Diagnosis not present

## 2024-08-16 DIAGNOSIS — F411 Generalized anxiety disorder: Secondary | ICD-10-CM

## 2024-08-16 MED ORDER — MOXIFLOXACIN HCL 400 MG PO TABS: 400.0000 mg | ORAL_TABLET | Freq: Every day | ORAL | 0 refills | Status: AC

## 2024-08-16 MED ORDER — METHYLPREDNISOLONE 4 MG PO TBPK: ORAL_TABLET | ORAL | 0 refills | Status: DC

## 2024-08-16 NOTE — Telephone Encounter (Signed)
 Attempted call to Dr Glory Cress at [facility]. [phone] [address]. Left a voice mail message requesting a return call.

## 2024-08-16 NOTE — Progress Notes (Signed)
..  Virtual Visit via Video Note  I connected with Krystal Burgess on 08/16/24 at  1:00 PM EDT by a video enabled telemedicine application and verified that I am speaking with the correct person using two identifiers.  Location: Patient: home Provider: clinic  .SABRAParticipating in visit:  Patient: Krystal Burgess Provider: Vermell Bologna PA-C   I discussed the limitations of evaluation and management by telemedicine and the availability of in person appointments. The patient expressed understanding and agreed to proceed.  History of Present Illness: Pt is a 63 yo obese female with COPD and chronic respiratory failure with hx of recurrent sinusitis who calls into the clinic with worsening sinus symptoms. On 8/4 she was seen virtually by PCP and given doxycycline  and prednisone  for sinusitis with COPD exacerbation. Pt states she did get better then symptoms started back a few days after she finished antibiotic. Now she is getting more and more sinus pressure. She is blowing out green stuff. She does not want it to go into her lungs. She is using all her medications, nasal sprays and tylenol . She is not having difficulty breathing at this time.     Observations/Objective: No acute distress  On 4L of O2 Pale appearance   Assessment and Plan: SABRASABRADiagnoses and all orders for this visit:  Recurrent sinusitis -     moxifloxacin  (AVELOX ) 400 MG tablet; Take 1 tablet (400 mg total) by mouth daily for 10 days. -     methylPREDNISolone  (MEDROL  DOSEPAK) 4 MG TBPK tablet; Take as directed by package insert.   Sent avelox  and medrol  dose pack Pt requested some sort of steroid due to all her congestion and inflammation Follow up as needed if symptoms persist or worsen   Follow Up Instructions:    I discussed the assessment and treatment plan with the patient. The patient was provided an opportunity to ask questions and all were answered. The patient agreed with the plan and demonstrated an understanding  of the instructions.   The patient was advised to call back or seek an in-person evaluation if the symptoms worsen or if the condition fails to improve as anticipated.   Makylee Sanborn, PA-C

## 2024-08-16 NOTE — Telephone Encounter (Signed)
 Last read by Shamonica W Ferrick [name] at 3:50PM on 08/15/2024.

## 2024-08-16 NOTE — Telephone Encounter (Signed)
Orders Placed This Encounter  Procedures   Ambulatory referral to Psychiatry    Referral Priority:   Routine    Referral Type:   Psychiatric    Referral Reason:   Specialty Services Required    Requested Specialty:   Psychiatry    Number of Visits Requested:   1    

## 2024-08-16 NOTE — Telephone Encounter (Signed)
 Spoke with Wanda -..Dr Glory Cress at [facility]. [phone] - states the only way patient can get her pain medication as it is a narcotic is by being seen in office.

## 2024-08-17 ENCOUNTER — Telehealth: Admitting: Family Medicine

## 2024-08-23 ENCOUNTER — Encounter: Payer: Self-pay | Admitting: Sports Medicine

## 2024-08-25 ENCOUNTER — Encounter: Payer: Self-pay | Admitting: Family Medicine

## 2024-08-25 ENCOUNTER — Telehealth (INDEPENDENT_AMBULATORY_CARE_PROVIDER_SITE_OTHER): Admitting: Family Medicine

## 2024-08-25 DIAGNOSIS — R002 Palpitations: Secondary | ICD-10-CM

## 2024-08-25 DIAGNOSIS — Z7409 Other reduced mobility: Secondary | ICD-10-CM

## 2024-08-25 DIAGNOSIS — I10 Essential (primary) hypertension: Secondary | ICD-10-CM

## 2024-08-25 DIAGNOSIS — E611 Iron deficiency: Secondary | ICD-10-CM

## 2024-08-25 DIAGNOSIS — N185 Chronic kidney disease, stage 5: Secondary | ICD-10-CM | POA: Diagnosis not present

## 2024-08-25 DIAGNOSIS — E870 Hyperosmolality and hypernatremia: Secondary | ICD-10-CM

## 2024-08-25 DIAGNOSIS — F411 Generalized anxiety disorder: Secondary | ICD-10-CM

## 2024-08-25 MED ORDER — AMBULATORY NON FORMULARY MEDICATION: 1 refills | Status: AC

## 2024-08-25 NOTE — Progress Notes (Signed)
 Called pt LVM advising her that I was calling to do her prescreening and that Dr. Alvan was running late. Asked that she stay logged in.

## 2024-08-25 NOTE — Assessment & Plan Note (Signed)
 Feels like anxiety levels are high.

## 2024-08-25 NOTE — Progress Notes (Signed)
 Virtual Visit via Video Note  I connected with Krystal Burgess on 08/25/24 at  2:40 PM EDT by a video enabled telemedicine application and verified that I am speaking with the correct person using two identifiers.   I discussed the limitations of evaluation and management by telemedicine and the availability of in person appointments. The patient expressed understanding and agreed to proceed.  Patient location: at home Provider location: in office  Subjective:    CC:   Chief Complaint  Patient presents with   Hypertension   Palpitations    HPI: She has some concerns in regards to her blood pressure and her pulse.  She says that she has initially had noticed feeling like her heart rate was beating up or racing when she would get more active she has been very sedentary and still recovering from a hospitalization earlier in the year that has left her mobility significantly impacted.  Now that she is in an apartment she has to move around a little bit more.  But more recently she has been noticing the palpitations even at rest.  It can last for several minutes or even a little longer.  She can sit down and rest and it usually will calm down.  She has been trying to check her blood pressure in fact when this happened the other day her blood pressure systolic was 190 and she called a friend to check on her.  She ended up calling EMS they came out and did an EKG which was normal and reassuring her heart rate was around 110 at that time.  She had been feeling very anxious and worried when this happened and wonders if it could just be panic/anxiety attacks.  She does feel like that getting emotionally upset does seem to trigger it.   She is homebound.  Viability is very limited and she does not have transportation currently  Past medical history, Surgical history, Family history not pertinant except as noted below, Social history, Allergies, and medications have been entered into the medical  record, reviewed, and corrections made.    Objective:    General: Speaking clearly in complete sentences without any shortness of breath.  Alert and oriented x3.  Normal judgment. No apparent acute distress.    Impression and Recommendations:    Problem List Items Addressed This Visit       Cardiovascular and Mediastinum   Essential hypertension   It sounds like blood pressures have been up and down as high as 190 when EMS came out but typically getting better blood pressure numbers at home at the time she was having the palpitation sensation.      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        Relevant Medications   AMBULATORY NON FORMULARY MEDICATION   Other Visit Diagnoses       Mobility impaired    -  Primary   Relevant Medications   AMBULATORY NON FORMULARY MEDICATION  Other Relevant Orders   Ambulatory referral to Home Health     Palpitations       Relevant Medications   AMBULATORY NON FORMULARY MEDICATION   Other Relevant Orders   Ambulatory referral to Home Health       Orders Placed This Encounter  Procedures   Ambulatory referral to Home Health    Referral Priority:   Routine    Referral Type:   Home Health Care    Referral Reason:   Specialty Services Required    Requested Specialty:   Home Health Services    Number of Visits Requested:   1    Meds ordered this encounter  Medications   AMBULATORY NON FORMULARY MEDICATION    Sig: Medication Name: Needs  labs drawn: CBC w/ diff, CMP, TSH, A1C, B12, B1, B6, Magensium, folate, Iron panel, Lipid panel, urine microalbumin    Dispense:  1 Units    Refill:  1   Palpitations-I recommend further workup she has been unable to return to our office to get lab work which is really essential for drug monitoring purposes we discussed how electrolyte disturbances, thyroid  disorder and abnormal glucose levels can significantly impact heart rate and cause chest discomfort and palpitations.  Add like to see if we can get home health to come out and work on her increasing her mobility and independence as well as getting some labs drawn.  I also like to get a home heart monitor will order ZIO device.  Normally as part of the workup I would also get an echocardiogram but again transportation will likely be an issue.   I discussed the assessment and treatment plan with the patient. The patient was provided an opportunity to ask questions and all were answered. The patient agreed with the plan and demonstrated an understanding of the instructions.   The patient was advised to call back or seek an in-person evaluation if the symptoms worsen or if the condition fails to improve as anticipated.   [name], MD

## 2024-08-25 NOTE — Assessment & Plan Note (Signed)
 It sounds like blood pressures have been up and down as high as 190 when EMS came out but typically getting better blood pressure numbers at home at the time she was having the palpitation sensation.

## 2024-08-26 ENCOUNTER — Other Ambulatory Visit: Payer: Self-pay | Admitting: Family Medicine

## 2024-08-26 ENCOUNTER — Other Ambulatory Visit: Payer: Self-pay

## 2024-08-26 ENCOUNTER — Other Ambulatory Visit (HOSPITAL_COMMUNITY): Payer: Self-pay

## 2024-08-26 MED ORDER — ONDANSETRON 4 MG PO TBDP
4.0000 mg | ORAL_TABLET | Freq: Three times a day (TID) | ORAL | 0 refills | Status: DC | PRN
  Filled 2024-08-26: qty 20, 7d supply, fill #0

## 2024-08-26 NOTE — Telephone Encounter (Signed)
 Unfortunately it depends on how long the last test was.

## 2024-08-29 ENCOUNTER — Telehealth: Payer: Self-pay | Admitting: Licensed Clinical Social Worker

## 2024-08-29 NOTE — Patient Outreach (Signed)
 Patient called into the SW needing assistance getting to her [employer], Krystal Burgess [phone]), Patient stated that when she was getting more food stamp she was living with someone and paying 600 in rent and half of he light bill, but now she has her own place and paying $800 in rent and about $82 for her lights, that her stamps were cut down to $26 each month, she has tried to call the food stamp worker an dshe was hung up on twice.   Tobias CHARM Maranda HEDWIG, PhD [facility], [facility] Social Worker Direct Dial: [phone]  Fax: [phone]

## 2024-08-30 ENCOUNTER — Other Ambulatory Visit (HOSPITAL_COMMUNITY): Payer: Self-pay

## 2024-08-30 ENCOUNTER — Encounter: Payer: Self-pay | Admitting: Family Medicine

## 2024-08-30 ENCOUNTER — Other Ambulatory Visit: Payer: Self-pay

## 2024-08-30 MED ORDER — ALPRAZOLAM 1 MG PO TABS: 1.0000 mg | ORAL_TABLET | Freq: Two times a day (BID) | ORAL | 0 refills | Status: DC | PRN

## 2024-08-30 NOTE — Telephone Encounter (Signed)
 Please see patient attached message  Requesting rx rf of alprazolam   1mg  until can be seen at psychiatry  Last written 07/25/2024 Last OV 08/25/2024 telelmedicine Upcoming appt next appt 09/01/2024 AWV

## 2024-09-01 ENCOUNTER — Ambulatory Visit

## 2024-09-01 ENCOUNTER — Encounter: Payer: Self-pay | Admitting: Family Medicine

## 2024-09-01 VITALS — Ht 60.0 in | Wt 244.0 lb

## 2024-09-01 DIAGNOSIS — Z23 Encounter for immunization: Secondary | ICD-10-CM

## 2024-09-01 DIAGNOSIS — Z5986 Financial insecurity: Secondary | ICD-10-CM

## 2024-09-01 DIAGNOSIS — Z0000 Encounter for general adult medical examination without abnormal findings: Secondary | ICD-10-CM

## 2024-09-01 DIAGNOSIS — Z5982 Transportation insecurity: Secondary | ICD-10-CM

## 2024-09-01 NOTE — Progress Notes (Signed)
 Subjective:   Krystal Burgess is a 63 y.o. female who presents for Medicare Annual (Subsequent) preventive examination.  Visit Complete: Virtual I connected with  Krystal Burgess on 09/01/24 by a audio enabled telemedicine application and verified that I am speaking with the correct person using two identifiers.  Patient Location: Home  Provider Location: Office/Clinic  I discussed the limitations of evaluation and management by telemedicine. The patient expressed understanding and agreed to proceed.  Vital Signs: Because this visit was a virtual/telehealth visit, some criteria may be missing or patient reported. Any vitals not documented were not able to be obtained and vitals that have been documented are patient reported.  Patient Medicare AWV questionnaire was completed by the patient on n/a; I have confirmed that all information answered by patient is correct and no changes since this date.  Cardiac Risk Factors include: obesity (BMI >30kg/m2);sedentary lifestyle;smoking/ tobacco exposure;dyslipidemia;hypertension     Objective:    Today's Vitals   09/01/24 1301  Weight: 244 lb (110.7 kg)  Height: 5' (1.524 m)  PainSc: 5    Body mass index is 47.65 kg/m.     09/01/2024    1:19 PM 12/29/2023    2:21 PM 08/25/2023    9:18 AM 01/29/2023   10:34 PM 01/29/2023    7:55 PM 01/16/2023    4:36 AM 01/15/2023    7:52 PM  Advanced Directives  Does Patient Have a Medical Advance Directive? No No No No No No No  Would patient like information on creating a medical advance directive? No - Patient declined No - Patient declined No - Patient declined No - Patient declined No - Patient declined Yes (Inpatient - patient requests chaplain consult to create a medical advance directive) No - Patient declined    Current Medications (verified) Outpatient Encounter Medications as of 09/01/2024  Medication Sig   albuterol  (VENTOLIN  HFA) 108 (90 Base) MCG/ACT inhaler Inhale 2 puffs into the lungs  every 6 (six) hours as needed for wheezing or shortness of breath.   ALPRAZolam  (XANAX ) 1 MG tablet Take 1 tablet (1 mg total) by mouth 2 (two) times daily as needed for anxiety.   AMBULATORY NON FORMULARY MEDICATION Battery powered portable oxygen  concentrator   AMBULATORY NON FORMULARY MEDICATION Medication Name: Needs labs drawn: CBC w/ diff, CMP, TSH, A1C, B12, B1, B6, Magensium, folate, Iron panel, Lipid panel, urine microalbumin   Azelastine  HCl 137 MCG/SPRAY SOLN Place into both nostrils.   Blood Glucose Monitoring Suppl DEVI 1 each by Does not apply route daily as needed. Include strips and lancets to test once daily.  Dx. Diabetes. May substitute to any manufacturer covered by patient's insurance.   buprenorphine (BUTRANS) 20 MCG/HR PTWK Place 1 patch onto the skin once a week.   diclofenac  (VOLTAREN ) 75 MG EC tablet Take 75 mg by mouth 2 (two) times daily.   fluticasone  (FLONASE ) 50 MCG/ACT nasal spray SPRAY 2 SPRAYS INTO EACH NOSTRIL EVERY DAY   Fluticasone -Umeclidin-Vilant (TRELEGY ELLIPTA ) 100-62.5-25 MCG/ACT AEPB Inhale 1 puff into the lungs daily.   gabapentin  (NEURONTIN ) 600 MG tablet Take 600 mg by mouth 3 (three) times daily.   ipratropium (ATROVENT ) 0.06 % nasal spray Place 2 sprays into both nostrils 3 (three) times daily.   ipratropium-albuterol  (DUONEB) 0.5-2.5 (3) MG/3ML SOLN Take 3 mLs by nebulization every 6 (six) hours as needed.   levocetirizine (XYZAL ) 5 MG tablet Take 5 mg by mouth.   levothyroxine  (SYNTHROID ) 88 MCG tablet Take 1 tablet (88 mcg total)  by mouth daily.   losartan  (COZAAR ) 100 MG tablet Take 1 tablet (100 mg total) by mouth daily.   ondansetron  (ZOFRAN -ODT) 4 MG disintegrating tablet Take 1 tablet (4 mg total) by mouth every 8 (eight) hours as needed for nausea or vomiting.   pantoprazole  (PROTONIX ) 40 MG tablet Take 1 tablet (40 mg total) by mouth daily.   promethazine  (PHENERGAN ) 25 MG tablet Take 25 mg by mouth every 6 (six) hours as needed for nausea  or vomiting.   rosuvastatin  (CRESTOR ) 20 MG tablet Take 1 tablet (20 mg total) by mouth at bedtime.   Sodium Chloride -Sodium Bicarb 1.57 g PACK Nasal saline irrigation nightly.   tiZANidine  (ZANAFLEX ) 2 MG tablet Take 1 tablet (2 mg total) by mouth 2 (two) times daily as needed for muscle spasms. Use sparingly.   traZODone  (DESYREL ) 50 MG tablet Take 1 tablet (50 mg total) by mouth at bedtime as needed for sleep.   Vilazodone  HCl (VIIBRYD ) 40 MG TABS Take 1 tablet (40 mg total) by mouth daily.   amLODipine  (NORVASC ) 10 MG tablet TAKE 1 TABLET BY MOUTH EVERY DAY (Patient not taking: Reported on 09/01/2024)   folic acid  (FOLVITE ) 1 MG tablet Take 1 mg by mouth daily. (Patient not taking: Reported on 09/01/2024)   furosemide  (LASIX ) 20 MG tablet Take 20 mg by mouth every other day. (Patient not taking: Reported on 09/01/2024)   [DISCONTINUED] methylPREDNISolone  (MEDROL  DOSEPAK) 4 MG TBPK tablet Take as directed by package insert.   No facility-administered encounter medications on file as of 09/01/2024.    Allergies (verified) Risperidone  and paliperidone, Aripiprazole, Fluoxetine hcl, Nsaids, Paroxetine, Ziprasidone  hcl, Chantix  [varenicline  tartrate], Metformin and related, Montelukast , Onglyza [saxagliptin ], Augmentin  [amoxicillin -pot clavulanate], and Fluoxetine hcl   History: Past Medical History:  Diagnosis Date   Allergy    COPD (chronic obstructive pulmonary disease) (HCC)    Glaucoma    Hyperlipidemia    Hypertension    MRSA (methicillin resistant staph aureus) culture positive    Pain    Renal disorder    Thyroid  disease    Past Surgical History:  Procedure Laterality Date   CESAREAN SECTION     CHOLECYSTECTOMY     lipoma removal     RT shoulder   LUMBAR SPINE SURGERY  12/23/2011   Dr. Delores, L4-5   pilonydal cyst  12/22/1992   TOTAL ABDOMINAL HYSTERECTOMY  1999   for abnormal cervical cells and fibroids   Family History  Problem Relation Age of Onset   Diabetes Brother     Depression Brother        bipolar   Cancer Brother        throat   Diabetes Other        grandmother   Depression Mother    Hyperlipidemia Mother    Hypertension Mother    Pancreatic cancer Mother    Hypertension Father    Social History   Socioeconomic History   Marital status: Divorced    Spouse name: Not on file   Number of children: 1   Years of education: 1   Highest education level: 12th grade  Occupational History   Occupation: Disabled  Tobacco Use   Smoking status: Former    Current packs/day: 0.00    Average packs/day: 1 pack/day for 38.6 years (38.6 ttl pk-yrs)    Types: Cigarettes    Start date: 07/30/1981    Quit date: 03/19/2020    Years since quitting: 4.4   Smokeless tobacco: Never  Vaping Use  Vaping status: Some Days   Substances: Nicotine   Substance and Sexual Activity   Alcohol use: Not Currently   Drug use: No   Sexual activity: Not Currently  Other Topics Concern   Not on file  Social History Narrative   Lives alone. She enjoys watching TV.   Social Drivers of Corporate investment banker Strain: Low Risk  (09/01/2024)   Overall Financial Resource Strain (CARDIA)    Difficulty of Paying Living Expenses: Not very hard  Food Insecurity: No Food Insecurity (09/01/2024)   Hunger Vital Sign    Worried About Running Out of Food in the Last Year: Never true    Ran Out of Food in the Last Year: Never true  Transportation Needs: No Transportation Needs (05/24/2024)   [facility]    Lack of Transportation (Medical): No    Lack of Transportation (Non-Medical): No  Physical Activity: Inactive (09/01/2024)   Exercise Vital Sign    Days of Exercise per Week: 0 days    Minutes of Exercise per Session: 0 min  Stress: Stress Concern Present (09/01/2024)   [employer] of Occupational Health - Occupational Stress Questionnaire    Feeling of Stress: Rather much  Social Connections: Socially Isolated  (09/01/2024)   Social Connection and Isolation Panel    Frequency of Communication with Friends and Family: More than three times a week    Frequency of Social Gatherings with Friends and Family: Once a week    Attends Religious Services: Never    Database administrator or Organizations: No    Attends Engineer, structural: Never    Marital Status: Divorced    Tobacco Counseling Counseling given: Not Answered   Clinical Intake:  Pre-visit preparation completed: Yes  Pain : 0-10 Pain Score: 5  Pain Type: Chronic pain Pain Location: Back Pain Orientation: Lower Pain Onset: More than a month ago Pain Frequency: Intermittent     BMI - recorded: 47.65 Nutritional Status: BMI > 30  Obese Nutritional Risks: None Diabetes: No  How often do you need to have someone help you when you read instructions, pamphlets, or other written materials from your doctor or pharmacy?: 1 - Never What is the last grade level you completed in school?: 12  Interpreter Needed?: No      Activities of Daily Living    09/01/2024    1:04 PM 12/29/2023    2:21 PM  In your present state of health, do you have any difficulty performing the following activities:  Hearing? 1 0  Vision? 1 0  Difficulty concentrating or making decisions? 1 0  Walking or climbing stairs? 1   Dressing or bathing? 0   Doing errands, shopping? 0 1  Preparing Food and eating ? N   Using the Toilet? N   In the past six months, have you accidently leaked urine? N   Do you have problems with loss of bowel control? N   Managing your Medications? N   Managing your Finances? N   Housekeeping or managing your Housekeeping? N     Patient Care Team: Alvan Krystal BIRCH, MD as PCP - General Dr. Belvie Novel (Ophthalmology)  Indicate any recent Medical Services you may have received from other than [facility] providers in the past year (date may be approximate).     Assessment:   This is a routine wellness  examination for Krystal Burgess.  Hearing/Vision screen No results found.   Goals Addressed  This Visit's Progress    Patient Stated       Patient states she would like to breath better and be able to walk better.        Depression Screen    09/01/2024    1:15 PM 12/22/2023   10:28 AM 09/08/2023   12:05 PM 08/25/2023    9:20 AM 07/01/2023    1:02 PM 12/16/2022    3:49 PM 08/18/2022    2:44 PM  PHQ 2/9 Scores  PHQ - 2 Score 6 6 5 6 6 6 4   PHQ- 9 Score 21 26 20 24 24 21 4     Fall Risk    09/01/2024    1:19 PM 08/25/2023    9:18 AM 07/01/2023    1:02 PM 01/22/2023    2:37 PM 08/18/2022    2:43 PM  Fall Risk   Falls in the past year? 0 1 1 1  0  Number falls in past yr: 0 0 1 1 0  Injury with Fall? 0 0 0 0 0  Risk for fall due to : Impaired mobility Impaired mobility;History of fall(s) History of fall(s);Impaired mobility;Impaired balance/gait History of fall(s);Impaired mobility;Impaired balance/gait;Medication side effect;Other (Comment) No Fall Risks  Risk for fall due to: Comment    morbid obesity   Follow up Falls evaluation completed Falls evaluation completed Falls evaluation completed Falls prevention discussed;Follow up appointment;Education provided Falls evaluation completed      Data saved with a previous flowsheet row definition    MEDICARE RISK AT HOME: Medicare Risk at Home Any stairs in or around the home?: Yes If so, are there any without handrails?: Yes Home free of loose throw rugs in walkways, pet beds, electrical cords, etc?: Yes Adequate lighting in your home to reduce risk of falls?: Yes Life alert?: No Use of a cane, walker or w/c?: Yes Grab bars in the bathroom?: Yes Shower chair or bench in shower?: Yes Elevated toilet seat or a handicapped toilet?: No  TIMED UP AND GO:  Was the test performed?  No    Cognitive Function:        09/01/2024    1:20 PM 08/25/2023    9:34 AM 08/18/2022    2:50 PM  6CIT Screen  What Year? 0 points 0  points 0 points  What month? 3 points 0 points 0 points  What time? 0 points 0 points 0 points  Count back from 20 0 points 0 points 0 points  Months in reverse 0 points 0 points 0 points  Repeat phrase 0 points 2 points 0 points  Total Score 3 points 2 points 0 points    Immunizations Immunization History  Administered Date(s) Administered   Influenza Inj Mdck Quad Pf 12/08/2022   Influenza Split 09/07/2012   Influenza Whole 08/26/2011   Influenza,inj,Quad PF,6+ Mos 08/31/2013, 09/07/2014, 11/01/2015, 08/14/2016, 09/17/2017, 08/17/2018, 10/03/2019, 11/28/2021   Influenza,inj,quad, With Preservative 03/21/2016   Influenza-Unspecified 03/21/2016   Pneumococcal Polysaccharide-23 10/14/2011   Tdap 11/05/2017    TDAP status: Up to date  Flu Vaccine status: Due, Education has been provided regarding the importance of this vaccine. Advised may receive this vaccine at local pharmacy or Health Dept. Aware to provide a copy of the vaccination record if obtained from local pharmacy or Health Dept. Verbalized acceptance and understanding.  Pneumococcal vaccine status: Due, Education has been provided regarding the importance of this vaccine. Advised may receive this vaccine at local pharmacy or Health Dept. Aware to provide a copy of the  vaccination record if obtained from local pharmacy or Health Dept. Verbalized acceptance and understanding.  Covid-19 vaccine status: Declined, Education has been provided regarding the importance of this vaccine but patient still declined. Advised may receive this vaccine at local pharmacy or Health Dept.or vaccine clinic. Aware to provide a copy of the vaccination record if obtained from local pharmacy or Health Dept. Verbalized acceptance and understanding.  Qualifies for Shingles Vaccine? Yes   Zostavax completed No   Shingrix  Completed?: No.    Education has been provided regarding the importance of this vaccine. Patient has been advised to call insurance  company to determine out of pocket expense if they have not yet received this vaccine. Advised may also receive vaccine at local pharmacy or Health Dept. Verbalized acceptance and understanding.  Screening Tests Health Maintenance  Topic Date Due   COVID-19 Vaccine (1) Never done   Zoster Vaccines- Shingrix  (1 of 2) Never done   Pneumococcal Vaccine: 50+ Years (2 of 2 - PCV) 10/13/2012   Mammogram  04/25/2019   OPHTHALMOLOGY EXAM  06/01/2020   FOOT EXAM  11/28/2022   HEMOGLOBIN A1C  07/17/2023   Diabetic kidney evaluation - Urine ACR  12/17/2023   Lung Cancer Screening  04/27/2024   Influenza Vaccine  07/22/2024   Colonoscopy  10/19/2024   Diabetic kidney evaluation - eGFR measurement  12/30/2024   Medicare Annual Wellness (AWV)  09/01/2025   DTaP/Tdap/Td (2 - Td or Tdap) 11/06/2027   Hepatitis C Screening  Completed   HIV Screening  Completed   Hepatitis B Vaccines 19-59 Average Risk  Aged Out   HPV VACCINES  Aged Out   Meningococcal B Vaccine  Aged Out    Health Maintenance  Health Maintenance Due  Topic Date Due   COVID-19 Vaccine (1) Never done   Zoster Vaccines- Shingrix  (1 of 2) Never done   Pneumococcal Vaccine: 50+ Years (2 of 2 - PCV) 10/13/2012   Mammogram  04/25/2019   OPHTHALMOLOGY EXAM  06/01/2020   FOOT EXAM  11/28/2022   HEMOGLOBIN A1C  07/17/2023   Diabetic kidney evaluation - Urine ACR  12/17/2023   Lung Cancer Screening  04/27/2024   Influenza Vaccine  07/22/2024   Colonoscopy  10/19/2024   Colorectal cancer screening - declined  Mammogram - Declined  Bone Density - Declined    Lung Cancer Screening: (Low Dose CT Chest recommended if Age 58-80 years, 20 pack-year currently smoking OR have quit w/in 15years.) does not qualify.   Lung Cancer Screening Referral: Patient declined.   Additional Screening:  Hepatitis C Screening: does qualify; Completed 11/01/2015  Vision Screening: Recommended annual ophthalmology exams for early detection of  glaucoma and other disorders of the eye. Is the patient up to date with their annual eye exam?  Yes  Who is the provider or what is the name of the office in which the patient attends annual eye exams? Dr Gennie If pt is not established with a provider, would they like to be referred to a provider to establish care? N/a.   Dental Screening: Recommended annual dental exams for proper oral hygiene   Community Resource Referral / Chronic Care Management: CRR required this visit?  Yes   CCM required this visit?  No     Plan:     I have personally reviewed and noted the following in the patient's chart:   Medical and social history Use of alcohol, tobacco or illicit drugs  Current medications and supplements including opioid prescriptions. Patient is  not currently taking opioid prescriptions. Functional ability and status Nutritional status Physical activity Advanced directives List of other physicians Hospitalizations # 3, surgeries # 0, and ER # 6 visits in previous 12 months Vitals Screenings to include cognitive, depression, and falls Referrals and appointments  In addition, I have reviewed and discussed with patient certain preventive protocols, quality metrics, and best practice recommendations. A written personalized care plan for preventive services as well as general preventive health recommendations were provided to patient.     Krystal Burgess, Krystal Burgess   09/01/2024   After Visit Summary: (MyChart) Due to this being a telephonic visit, the after visit summary with patients personalized plan was offered to patient via MyChart   Nurse Notes:   Krystal Burgess is a 63 y.o. female patient of Metheney, Krystal BIRCH, MD who had a Medicare Annual Wellness Visit today via telephone. Daylen is Disabled and lives alone. She has 1 child. she reports that she is socially active and does interact with friends/family regularly. She is minimally physically active and enjoys  watching TV.

## 2024-09-01 NOTE — Patient Instructions (Signed)
  Krystal Burgess , Thank you for taking time to come for your Medicare Wellness Visit. I appreciate your ongoing commitment to your health goals. Please review the following plan we discussed and let me know if I can assist you in the future.   These are the goals we discussed:  Goals       Care Coordination Activities-continue to improve post hospitalization      Interventions Today    Flowsheet Row Most Recent Value  Chronic Disease   Chronic disease during today's visit Chronic Obstructive Pulmonary Disease (COPD)  General Interventions   General Interventions Discussed/Reviewed General Interventions Reviewed, Doctor Visits  Doctor Visits Discussed/Reviewed Doctor Visits Discussed, Doctor Visits Reviewed, PCP  PCP/Specialist Visits Compliance with follow-up visit  [reviewed upcoming provider visits. Strongly encouraged patient to attend pulmonology visit scheduled for 06/19/23.]  Exercise Interventions   Exercise Discussed/Reviewed Physical Activity  [encouraged to remain as active as tolerated or per provider recommendation]  Education Interventions   Education Provided Provided Education  Provided Verbal Education On When to see the doctor, Medication, Nutrition  [reviewed patient instructions per video visit completed on yesterday. advised to attend provider visits as scheduled, take medications as prescribed.]  Pharmacy Interventions   Pharmacy Dicussed/Reviewed Pharmacy Topics Reviewed            Patient Stated (pt-stated)      Patient would like to be able to walk around and do ADLs by herself.      Patient Stated (pt-stated)      Patient stated that she would like to be more independent.       Patient Stated      Patient states she would like to breath better and be able to walk better.         This is a list of the screening recommended for you and due dates:  Health Maintenance  Topic Date Due   COVID-19 Vaccine (1) Never done   Zoster (Shingles) Vaccine (1 of 2)  Never done   Pneumococcal Vaccine for age over 13 (2 of 2 - PCV) 10/13/2012   Breast Cancer Screening  04/25/2019   Eye exam for diabetics  06/01/2020   Complete foot exam   11/28/2022   Hemoglobin A1C  07/17/2023   Yearly kidney health urinalysis for diabetes  12/17/2023   Screening for Lung Cancer  04/27/2024   Flu Shot  07/22/2024   Colon Cancer Screening  10/19/2024   Yearly kidney function blood test for diabetes  12/30/2024   Medicare Annual Wellness Visit  09/01/2025   DTaP/Tdap/Td vaccine (2 - Td or Tdap) 11/06/2027   Hepatitis C Screening  Completed   HIV Screening  Completed   Hepatitis B Vaccine  Aged Out   HPV Vaccine  Aged Out   Meningitis B Vaccine  Aged Out

## 2024-09-02 ENCOUNTER — Other Ambulatory Visit (HOSPITAL_COMMUNITY): Payer: Self-pay

## 2024-09-02 ENCOUNTER — Telehealth: Payer: Self-pay

## 2024-09-02 DIAGNOSIS — J449 Chronic obstructive pulmonary disease, unspecified: Secondary | ICD-10-CM | POA: Diagnosis not present

## 2024-09-02 DIAGNOSIS — R8290 Unspecified abnormal findings in urine: Secondary | ICD-10-CM | POA: Diagnosis not present

## 2024-09-02 MED ORDER — PREVNAR 20 0.5 ML IM SUSY
0.5000 mL | PREFILLED_SYRINGE | Freq: Once | INTRAMUSCULAR | 0 refills | Status: AC
  Filled 2024-09-02 – 2024-09-05 (×2): qty 0.5, 1d supply, fill #0

## 2024-09-02 MED ORDER — INFLUENZA VAC SPLIT HIGH-DOSE 0.5 ML IM SUSY
0.5000 mL | PREFILLED_SYRINGE | Freq: Once | INTRAMUSCULAR | 0 refills | Status: AC
  Filled 2024-09-02: qty 0.5, 1d supply, fill #0

## 2024-09-02 MED ORDER — ZOSTER VAC RECOMB ADJUVANTED 50 MCG/0.5ML IM SUSR
0.5000 mL | Freq: Once | INTRAMUSCULAR | 1 refills | Status: AC
  Filled 2024-09-02: qty 0.5, 1d supply, fill #0

## 2024-09-02 NOTE — Telephone Encounter (Signed)
Krystal Burgess is working on this.

## 2024-09-02 NOTE — Telephone Encounter (Signed)
 Spoke with patient- she requested that we send a message to DR. Metheney .   She asked if Dr. Alvan would prescribe her pain medication and I told her no as we had discussed previously would have to go through neurology for this to be filled.  She states she can not afford the $100 required and she can not get down her stairs in order to get to he office.  I told her that Dr. Alvan would NOT write the pain medication for her .   She asked that I forward a message to Dr. Alvan  to see if there is any other medications she could write to  help her with her sciatic pain at all.

## 2024-09-02 NOTE — Telephone Encounter (Signed)
 Spoke with Adele KATHEE Maiden, RPH with [facility].  He is checking on whether this can be done and billed by pharmacy.  I have a call in to the supervising Home Health nurse  Vertell Duran [phone] requesting  a call back to check on this.

## 2024-09-02 NOTE — Telephone Encounter (Signed)
 Copied from CRM #[id number]. Topic: Clinical - Medical Advice >> Sep 01, 2024  3:06 PM Carrielelia G wrote: Patient Krystal Burgess ask.  Can the home health Nurse give me the flu, pneumonia and shingles vaccination, when she comes to visit.  Since I am unable to travel out right now.    Please advise

## 2024-09-02 NOTE — Telephone Encounter (Signed)
 Meds ordered this encounter  Medications   Influenza vac split trivalent PF (FLUZONE  HIGH-DOSE) 0.5 ML injection    Sig: Inject 0.5 mLs into the muscle once for 1 dose.    Dispense:  0.5 mL    Refill:  0    Please deliver to pt home for home health to admin   pneumococcal 20-valent conjugate vaccine (PREVNAR 20 ) 0.5 ML injection    Sig: Inject 0.5 mLs into the muscle once for 1 dose.    Dispense:  0.5 mL    Refill:  0    Please deliver to pt for home health to admin   Zoster Vaccine Adjuvanted (SHINGRIX ) injection    Sig: Inject 0.5 mLs into the muscle once for 1 dose. Repeat dose in 2-6 months.    Dispense:  0.5 mL    Refill:  1    Please deliver for home health to admin   Sent to [facility]

## 2024-09-02 NOTE — Telephone Encounter (Signed)
 Attempted call to [facility] to inquire about this.  Left a voice mail message requesting a return call.

## 2024-09-03 ENCOUNTER — Other Ambulatory Visit (HOSPITAL_COMMUNITY): Payer: Self-pay

## 2024-09-05 ENCOUNTER — Telehealth: Payer: Self-pay

## 2024-09-05 ENCOUNTER — Other Ambulatory Visit: Payer: Self-pay

## 2024-09-05 NOTE — Telephone Encounter (Signed)
 Spoke with Vertell supervising nurse with [facility]- she states in reading her policies  Shows that can give flu and pneumonia but not the shingrix   Also states would have to include anaphylaxis  orders as well before could be administered from them  Also waiting on pharmacy to let us  know if they will be able to supply the vaccines to the patient for administration. ( How they could bill for this.They are checking with their supervisor and will let us  know once they have this answer . The supervisor is currently in a meeting.

## 2024-09-05 NOTE — Telephone Encounter (Signed)
 Copied from CRM #[id number]. Topic: General - Call Back - No Documentation >> Sep 05, 2024 12:49 PM Cherylann RAMAN wrote: Reason for CRM: Vertell [facility], returning [facility] call regarding if they are able to administer vaccination to patient. Vertell is requesting Suzen to return her call at [phone].

## 2024-09-05 NOTE — Telephone Encounter (Signed)
 Attempting a call from  supervising home health nurse. Left a voice mail message again requesting  a return call.

## 2024-09-05 NOTE — Telephone Encounter (Signed)
 Spoke with pharmacy - and responded as below The current decision is we cannot send the vaccines to the home as we were hoping we could do.  Our vaccine coordinator is looking ot see if something is possible with physician services.  But our outpatient pharmacies cannot provide.

## 2024-09-05 NOTE — Telephone Encounter (Signed)
 Spoke with Vickie - [facility] supervisor- she will check with her supervisor and will reach out to us  when she knows more.

## 2024-09-06 NOTE — Telephone Encounter (Signed)
  I have heard from both pharmacy and home health regarding administering vaccines at patient's home.  Unfortunately the pharmacy has responded as below    The current decision is we cannot send the vaccines to the home as we were hoping we could do.   Our vaccine coordinator is looking ot see if something is possible with physician services.  But our outpatient pharmacies cannot provide.

## 2024-09-06 NOTE — Telephone Encounter (Signed)
 Okay, thank you!

## 2024-09-06 NOTE — Telephone Encounter (Signed)
     09/06/24  9:26 AM Note   I have heard from both pharmacy and home health regarding administering vaccines at patient's home.  Unfortunately the pharmacy has responded as below    The current decision is we cannot send the vaccines to the home as we were hoping we could do.   Our vaccine coordinator is looking ot see if something is possible with physician services.  But our outpatient pharmacies cannot provide.

## 2024-09-07 ENCOUNTER — Other Ambulatory Visit (HOSPITAL_COMMUNITY): Payer: Self-pay

## 2024-09-07 ENCOUNTER — Ambulatory Visit: Payer: Self-pay

## 2024-09-07 ENCOUNTER — Telehealth: Admitting: Physician Assistant

## 2024-09-07 DIAGNOSIS — H1031 Unspecified acute conjunctivitis, right eye: Secondary | ICD-10-CM | POA: Diagnosis not present

## 2024-09-07 MED ORDER — POLYMYXIN B-TRIMETHOPRIM 10000-0.1 UNIT/ML-% OP SOLN: 1.0000 [drp] | OPHTHALMIC | 0 refills | Status: DC

## 2024-09-07 NOTE — Telephone Encounter (Signed)
 FYI Only or Action Required?: Action required by provider: request for appointment.  Patient was last seen in primary care on 08/25/2024 by Alvan Dorothyann BIRCH, MD.  Called Nurse Triage reporting Conjunctivitis.  Symptoms began several days ago.  Interventions attempted: Other: not sure.  Symptoms are: unchanged.  Triage Disposition: See HCP Within 4 Hours (Or PCP Triage)  Patient/caregiver understands and will follow disposition?: YesCopied from CRM [id number]. Topic: Clinical - Red Word Triage >> Sep 07, 2024  3:11 PM Suzette B wrote: [facility] that prompted transfer to Nurse Triage: right eye is itchy/pain, no drainage, but eyes are extremely but right is more severe due to the pain   ----------------------------------------------------------------------- From previous Reason for Contact - Scheduling: Patient/patient representative is calling to schedule an appointment. Refer to attachments for appointment information. Reason for Disposition  MODERATE eye pain or discomfort (e.g., interferes with normal activities or awakens from sleep; more than mild)  Answer Assessment - Initial Assessment Questions Pt disabled and requested virtual appt for pink eye/allergies.     1. LOCATION: Where is the redness? (e.g., eyeball or outer eyelids) Note: When callers say the eye is red, they usually mean the sclera (white of the eye) is red.       Sclera  2. ONE OR BOTH EYES: Is the redness in one or both eyes?      Both- right eye is worse 3. ONSET: When did the redness start? (e.g., hours, days)      Couple of days 4. EYELIDS: Are the eyelids red or swollen? If Yes, ask: How much?      yes 5. VISION: Do you have blurred vision?     denies 6. ITCHING: Does it feel itchy? If so ask: How bad is it (Scale 1-10; or mild, moderate, severe)     Moderate-severe 7. PAIN: Is it painful? If Yes, ask: How bad is the pain? (Scale 0-10; or none, mild, moderate, severe)     Right  eye is very painful  8. CONTACT LENS: Do you wear contacts?     denies 9. CAUSE: What do you think is causing the redness?     Allergies or pink eye 10. OTHER SYMPTOMS: Do you have any other symptoms? (e.g., fever, runny nose, cough, vomiting)       Recently had sinus infection  Protocols used: Eye - Redness-A-AH

## 2024-09-07 NOTE — Telephone Encounter (Signed)
 Patient had  today with Vermell Bologna, PA

## 2024-09-07 NOTE — Progress Notes (Unsigned)
..  Virtual Visit via Video Note  I connected with Avelina LELON Sharps on 09/09/24 at  3:20 PM EDT by a video enabled telemedicine application and verified that I am speaking with the correct person using two identifiers.  Location: Patient: home Provider: clinic  .SABRAParticipating in visit:  Patient: [name] Provider: Vermell Bologna PA-C   I discussed the limitations of evaluation and management by telemedicine and the availability of in person appointments. The patient expressed understanding and agreed to proceed.  History of Present Illness: Pt is a 63 yo female who calls into the clinic with right eye pain, itchy, discharge for the last 48 hours. She has used visine with no relief. No fever, chills, body aches, sinus pressure, ear pain. She has some pain under right eye to touch and feels warm.  She has chronic respiratory failure and on continuous O2. She denies any worsening problems breathing.     Observations/Objective: No acute distress On continuous O2 Normal breathing Right eye conjunctivitis and some evidence of swelling over lower eyelid   Assessment and Plan: SABRASABRADiagnoses and all orders for this visit:  Acute bacterial conjunctivitis of right eye -     trimethoprim -polymyxin b  (POLYTRIM ) ophthalmic solution; Place 1 drop into the right eye every 4 (four) hours. For 7 days.   Sent polytrim  to pharmacy for 7 days Warm compresses at least twice a day Avoid recontamination with towels/makeup etc Follow up as needed if symptoms persist, change or worsen.    Follow Up Instructions:    I discussed the assessment and treatment plan with the patient. The patient was provided an opportunity to ask questions and all were answered. The patient agreed with the plan and demonstrated an understanding of the instructions.   The patient was advised to call back or seek an in-person evaluation if the symptoms worsen or if the condition fails to improve as anticipated.    Niajah Sipos, PA-C

## 2024-09-08 ENCOUNTER — Telehealth: Payer: Self-pay | Admitting: *Deleted

## 2024-09-08 ENCOUNTER — Ambulatory Visit: Payer: Self-pay

## 2024-09-08 DIAGNOSIS — M51362 Other intervertebral disc degeneration, lumbar region with discogenic back pain and lower extremity pain: Secondary | ICD-10-CM

## 2024-09-08 DIAGNOSIS — G629 Polyneuropathy, unspecified: Secondary | ICD-10-CM

## 2024-09-08 DIAGNOSIS — J449 Chronic obstructive pulmonary disease, unspecified: Secondary | ICD-10-CM | POA: Diagnosis not present

## 2024-09-08 DIAGNOSIS — N189 Chronic kidney disease, unspecified: Secondary | ICD-10-CM | POA: Diagnosis not present

## 2024-09-08 DIAGNOSIS — I129 Hypertensive chronic kidney disease with stage 1 through stage 4 chronic kidney disease, or unspecified chronic kidney disease: Secondary | ICD-10-CM | POA: Diagnosis not present

## 2024-09-08 NOTE — Progress Notes (Signed)
 Complex Care Management Note  Care Guide Note 09/08/2024 Name: Krystal Burgess MRN: [id number] DOB: Feb 01, 1961  Krystal Burgess is a 62 y.o. year old female who sees Metheney, Dorothyann BIRCH, MD for primary care. I reached out to Avelina LELON Sharps by phone today to offer complex care management services.  [name] was given information about Complex Care Management services today including:   The Complex Care Management services include support from the care team which includes your Nurse Care Manager, Clinical Social Worker, or Pharmacist.  The Complex Care Management team is here to help remove barriers to the health concerns and goals most important to you. Complex Care Management services are voluntary, and the patient may decline or stop services at any time by request to their care team member.   Complex Care Management Consent Status: Patient did not agree to participate in complex care management services at this time.  Follow up plan:  pt declined services at this time - message sent to pcp per pt request   Encounter Outcome:  Patient Refused  Thedford Franks, CMA [facility]  [facility], [facility] Guide Direct Dial: [phone]  Fax: [phone] Website: [email or web address]
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
## 2024-09-08 NOTE — Telephone Encounter (Signed)
 Order placed for pain mgt.   Ok to use [facility] to give her resources for counseling;

## 2024-09-09 ENCOUNTER — Telehealth: Payer: Self-pay

## 2024-09-09 LAB — LAB REPORT - SCANNED
A1c: 5.2
EGFR: 42
TSH: 4.64 (ref 0.41–5.90)

## 2024-09-09 NOTE — Telephone Encounter (Signed)
 He can go up on her gabapentin  to the 800 mg dose that is the highest.  I am not sure if she has tried that before.

## 2024-09-09 NOTE — Patient Instructions (Signed)
 Bacterial Conjunctivitis, Adult  Bacterial conjunctivitis is an infection of your conjunctiva. This is the clear membrane that covers the white part of your eye and the inner part of your eyelid. This infection can make your eye:  Red or pink.  Itchy or irritated.  This condition spreads easily from person to person (is contagious) and from one eye to the other eye.  What are the causes?  This condition is caused by germs (bacteria). You may get the infection if you come into close contact with:  A person who has the infection.  Items that have germs on them (are contaminated), such as face towels, contact lens solution, or eye makeup.  What increases the risk?  You are more likely to get this condition if:  You have contact with people who have the infection.  You wear contact lenses.  You have a sinus infection.  You have had a recent eye injury or surgery.  You have a weak body defense system (immune system).  You have dry eyes.  What are the signs or symptoms?    Thick, yellowish discharge from the eye.  Tearing or watery eyes.  Itchy eyes.  Burning feeling in your eyes.  Eye redness.  Swollen eyelids.  Blurred vision.  How is this treated?    Antibiotic eye drops or ointment.  Antibiotic medicine taken by mouth. This is used for infections that do not get better with drops or ointment or that last more than 10 days.  Cool, wet cloths placed on the eyes.  Artificial tears used 2-6 times a day.  Follow these instructions at home:  Medicines  Take or apply your antibiotic medicine as told by your doctor. Do not stop using it even if you start to feel better.  Take or apply over-the-counter and prescription medicines only as told by your doctor.  Do not touch your eyelid with the eye-drop bottle or the ointment tube.  Managing discomfort  Wipe any fluid from your eye with a warm, wet washcloth or a cotton ball.  Place a clean, cool, wet cloth on your eye. Do this for 10-20 minutes, 3-4 times a day.  General  instructions  Do not wear contacts until the infection is gone. Wear glasses until your doctor says it is okay to wear contacts again.  Do not wear eye makeup until the infection is gone. Throw away old eye makeup.  Change or wash your pillowcase every day.  Do not share towels or washcloths.  Wash your hands often with soap and water for at least 20 seconds and especially before touching your face or eyes. Use paper towels to dry your hands.  Do not touch or rub your eyes.  Do not drive or use heavy machinery if your vision is blurred.  Contact a doctor if:  You have a fever.  You do not get better after 10 days.  Get help right away if:  You have a fever and your symptoms get worse all of a sudden.  You have very bad pain when you move your eye.  Your face:  Hurts.  Is red.  Is swollen.  You have sudden loss of vision.  Summary  Bacterial conjunctivitis is an infection of your conjunctiva.  This infection spreads easily from person to person.  Wash your hands often with soap and water for at least 20 seconds and especially before touching your face or eyes. Use paper towels to dry your hands.  Take or apply your  antibiotic medicine as told by your doctor.  Contact a doctor if you have a fever or you do not get better after 10 days.  This information is not intended to replace advice given to you by your health care provider. Make sure you discuss any questions you have with your health care provider.  Document Revised: 03/20/2021 Document Reviewed: 03/20/2021  Elsevier Patient Education  2024 [employer].

## 2024-09-09 NOTE — Telephone Encounter (Signed)
 Copied from CRM [id number]. Topic: Clinical - Lab/Test Results >> Sep 09, 2024  1:03 PM Miquel SAILOR wrote: Reason for CRM: Barnie [id number] looking for Pt if Dr, Alvan Lab orders from 09/15 in our system. Let her know none shown for 09/15 Lab results. No further quetions

## 2024-09-13 ENCOUNTER — Telehealth: Payer: Self-pay

## 2024-09-13 NOTE — Telephone Encounter (Signed)
 Lab results received and placed in Dr. Metheney's inbasket for review.

## 2024-09-13 NOTE — Telephone Encounter (Signed)
 Spoke with Kasiddy  with [facility] home health  She states lab work was drawn on 09/05/2024 - she is  questioning if we had received these results.  I informed her that none showing from September 2025 for patient.  She is re- faxing these results to our office today

## 2024-09-13 NOTE — Telephone Encounter (Signed)
 Copied from CRM [id number]. Topic: Clinical - Lab/Test Results >> Sep 09, 2024  1:03 PM Miquel SAILOR wrote: Reason for CRM: [id number] [id number] looking for Pt if Dr, Alvan Lab orders from 09/15 in our system. Let her know none shown for 09/15 Lab results. No further quetions >> Sep 13, 2024  3:23 PM Alfonso ORN wrote: eual from [facility] home health calling to see if   Dr. Dorothyann Alvan received the labs drawn and urine labs that was done last week on  09/05/24  and the results came through on the 09/09/24   ty with [facility]  >> Sep 09, 2024  3:00 PM Hamdi H wrote: eual from [facility] home health called to see if we received the lab results from 9/15. I relayed the message to her that we still have not received them. No further questions.

## 2024-09-14 ENCOUNTER — Encounter: Payer: Self-pay | Admitting: Family Medicine

## 2024-09-14 ENCOUNTER — Other Ambulatory Visit (HOSPITAL_COMMUNITY): Payer: Self-pay

## 2024-09-14 DIAGNOSIS — J449 Chronic obstructive pulmonary disease, unspecified: Secondary | ICD-10-CM | POA: Diagnosis not present

## 2024-09-14 DIAGNOSIS — J9620 Acute and chronic respiratory failure, unspecified whether with hypoxia or hypercapnia: Secondary | ICD-10-CM | POA: Diagnosis not present

## 2024-09-14 LAB — LAB REPORT - SCANNED
A1c: 5.2
A1c: 5.2
EGFR: 42

## 2024-09-14 NOTE — Telephone Encounter (Signed)
 Call patient: I did get the copy of the labs.  Hemoglobin shows mild anemia with a hemoglobin of 9.7.  This is actually down a little bit from the blood count that she had at the emergency department in June when her hemoglobin was 10.8.  Has she noticed any blood in the urine or stool?  I would recommend GI referral since she is technically due for repeat scope in October and her hemoglobin is dropping.   Kidney function is elevated at 1.4.  Baseline is typically around 1.3 based on recent labs earlier this summer so it is fairly stable.  Electrolytes are normal.  Protein level was  low.  A1c looks great at 5.2.  Iron levels are low is she taking an iron supplement?  Folic acid  is normal.  Thyroid  level is 4.6 and magnesium  looks good.  The vitamin B1 was still pending when they fax these records.

## 2024-09-14 NOTE — Telephone Encounter (Signed)
 Received lab results and was placed in Dr. Alvan basket for review. =kph

## 2024-09-15 ENCOUNTER — Encounter: Payer: Self-pay | Admitting: Family Medicine

## 2024-09-15 ENCOUNTER — Telehealth: Payer: Self-pay | Admitting: Family Medicine

## 2024-09-15 DIAGNOSIS — J449 Chronic obstructive pulmonary disease, unspecified: Secondary | ICD-10-CM | POA: Diagnosis not present

## 2024-09-15 DIAGNOSIS — J9620 Acute and chronic respiratory failure, unspecified whether with hypoxia or hypercapnia: Secondary | ICD-10-CM | POA: Diagnosis not present

## 2024-09-15 MED ORDER — NITROFURANTOIN MONOHYD MACRO 100 MG PO CAPS: 100.0000 mg | ORAL_CAPSULE | Freq: Two times a day (BID) | ORAL | 0 refills | Status: DC

## 2024-09-15 NOTE — Telephone Encounter (Signed)
 Please see separate note in regard to treating UTI

## 2024-09-15 NOTE — Telephone Encounter (Signed)
 Please call patient and let her know that the urine culture did come back positive typically we consider this to treat UTI if there are greater than 100,000 colonies this 1 just showed 25-50 but I am going to go ahead and treat her if she is having symptoms.  It looks like we can use nitrofurantoin .  I will send this over to the local pharmacy.  Meds ordered this encounter  Medications   nitrofurantoin , macrocrystal-monohydrate, (MACROBID ) 100 MG capsule    Sig: Take 1 capsule (100 mg total) by mouth 2 (two) times daily.    Dispense:  10 capsule    Refill:  0

## 2024-09-15 NOTE — Telephone Encounter (Signed)
 Spoke with patient informed of lab results.  She denies blood seen in urine or stool - she states she has been bleeding from nose /sinuses - she again states she has another sinus infection  and wanted to know if this could be treated.   She states that there is not a way for her to get to GI for evaluation or colonoscopy  as she can not get down the stairs   She is not taking any iron supplements

## 2024-09-15 NOTE — Telephone Encounter (Signed)
 Documented after speaking with patient in the separate message.

## 2024-09-15 NOTE — Telephone Encounter (Signed)
 Patient informed of results. She states she does have  occasional burning with urination  and frequency with little production x 1 1/2 -2 weeks  Patient was informed that abx at pharmacy to treat these symptoms.

## 2024-09-16 ENCOUNTER — Encounter: Payer: Self-pay | Admitting: Family Medicine

## 2024-09-16 NOTE — Telephone Encounter (Signed)
 Spoke with patient informed Has video visit with Dr. Alvan on Monday 09/18/2024 Does not have a Child psychotherapist currently- she  will discuss ordering a [facility] social worker eval with Dr. Stefani on Monday  She will look into transportation support online through [employer] and will call insurance co.  She does states she is starting to be able to walk a little bit more now that pain is being helped with pain patches.  Is getting pain patches through neurologists  per patient.

## 2024-09-16 NOTE — Telephone Encounter (Addendum)
 Documented in different message. By luke plenty, LPN  Annabella Rigg, CMA

## 2024-09-16 NOTE — Telephone Encounter (Signed)
Routed to front desk to schedule pt

## 2024-09-16 NOTE — Telephone Encounter (Signed)
 She has to have an appt. Can schedule for a virtual

## 2024-09-16 NOTE — Telephone Encounter (Signed)
 In regards to sinuses please see other note that is already open in this regard.  In regards to the transportation issue she really needs to check with her insurance to see if they can provide something that will help her get downstairs to transportation or even do find help at.  There are several resources for local transportation on that as well again the insurance might be helpful because sometimes with Medicare they provide some additional services for people who are truly homebound which she is.  Also checking with her social worker can be helpful as well I believe she is connected with one of our social workers previously.

## 2024-09-19 ENCOUNTER — Telehealth (INDEPENDENT_AMBULATORY_CARE_PROVIDER_SITE_OTHER): Admitting: Family Medicine

## 2024-09-19 ENCOUNTER — Other Ambulatory Visit: Payer: Self-pay | Admitting: Family Medicine

## 2024-09-19 DIAGNOSIS — J0100 Acute maxillary sinusitis, unspecified: Secondary | ICD-10-CM | POA: Diagnosis not present

## 2024-09-19 MED ORDER — PREDNISONE 20 MG PO TABS: 40.0000 mg | ORAL_TABLET | Freq: Every day | ORAL | 0 refills | Status: DC

## 2024-09-19 MED ORDER — CEFDINIR 300 MG PO CAPS: 300.0000 mg | ORAL_CAPSULE | Freq: Two times a day (BID) | ORAL | 0 refills | Status: DC

## 2024-09-19 NOTE — Progress Notes (Unsigned)
    Virtual Visit via Video Note  I connected with Krystal Burgess on 09/20/24 at 10:10 AM EDT by a video enabled telemedicine application and verified that I am speaking with the correct person using two identifiers.   I discussed the limitations of evaluation and management by telemedicine and the availability of in person appointments. The patient expressed understanding and agreed to proceed.  Patient location: at home Provider location: in office  Subjective:    CC:  No chief complaint on file.   HPI: Krystal Burgess is here for virtual visit today for sinus symptoms that started at the end of last week so approximately 4 to 5 days.  She says that it is getting worse with sinus pressure congestion and now green mucus pressure is mostly in her cheeks and forehead.  It starting to wake her up at night because it so uncomfortable if she feels like her throat is thick.  No fever or chills.  Her new apartment does have some mold on the windows and she does have a neighbor who is going to come soon to help her more thoroughly clean she is hoping that that will help.  She does have some mild sore throat from the drainage but nothing severe and just slight cough but she does not feel like it is quite in her chest.   Past medical history, Surgical history, Family history not pertinant except as noted below, Social history, Allergies, and medications have been entered into the medical record, reviewed, and corrections made.    Objective:    General: Speaking clearly in complete sentences without any shortness of breath.  Alert and oriented x3.  Normal judgment. No apparent acute distress.    Impression and Recommendations:    Problem List Items Addressed This Visit   None Visit Diagnoses       Acute non-recurrent maxillary sinusitis    -  Primary   Relevant Medications   cefdinir  (OMNICEF ) 300 MG capsule   predniSONE  (DELTASONE ) 20 MG tablet      Antibiotic and prednisone  as above if not  improving then please let us  know so would really like for her to get connected with ENT as she has recurrent sinus infections and does understand the risk of recurrent antibiotic and steroid use.  We had tried to see if pharmacy would be able to administer flu shot and pneumonia shot at home.  Be unable to deliver it to her apartment.  No orders of the defined types were placed in this encounter.   Meds ordered this encounter  Medications   cefdinir  (OMNICEF ) 300 MG capsule    Sig: Take 1 capsule (300 mg total) by mouth 2 (two) times daily.    Dispense:  14 capsule    Refill:  0   predniSONE  (DELTASONE ) 20 MG tablet    Sig: Take 2 tablets (40 mg total) by mouth daily with breakfast.    Dispense:  10 tablet    Refill:  0     I discussed the assessment and treatment plan with the patient. The patient was provided an opportunity to ask questions and all were answered. The patient agreed with the plan and demonstrated an understanding of the instructions.   The patient was advised to call back or seek an in-person evaluation if the symptoms worsen or if the condition fails to improve as anticipated.   [name], MD

## 2024-09-20 ENCOUNTER — Other Ambulatory Visit (HOSPITAL_COMMUNITY): Payer: Self-pay

## 2024-09-20 ENCOUNTER — Telehealth: Payer: Self-pay | Admitting: Family Medicine

## 2024-09-20 ENCOUNTER — Encounter: Payer: Self-pay | Admitting: Family Medicine

## 2024-09-20 NOTE — Telephone Encounter (Signed)
 Had labs done with an A1c of 5.2 looks like this was abstracted by [facility] but it is not showing up that she has had it done under her health maintenance.

## 2024-09-20 NOTE — Telephone Encounter (Signed)
 Krystal Burgess can you look at this?

## 2024-09-21 ENCOUNTER — Other Ambulatory Visit (HOSPITAL_COMMUNITY): Payer: Self-pay

## 2024-09-21 ENCOUNTER — Encounter: Payer: Self-pay | Admitting: Family Medicine

## 2024-09-22 ENCOUNTER — Other Ambulatory Visit: Payer: Self-pay | Admitting: Family Medicine

## 2024-09-22 ENCOUNTER — Other Ambulatory Visit (HOSPITAL_COMMUNITY): Payer: Self-pay

## 2024-09-22 DIAGNOSIS — J4489 Other specified chronic obstructive pulmonary disease: Secondary | ICD-10-CM

## 2024-09-23 ENCOUNTER — Other Ambulatory Visit: Payer: Self-pay | Admitting: Family Medicine

## 2024-09-23 ENCOUNTER — Telehealth: Payer: Self-pay | Admitting: Family Medicine

## 2024-09-23 ENCOUNTER — Other Ambulatory Visit: Payer: Self-pay

## 2024-09-23 ENCOUNTER — Other Ambulatory Visit (HOSPITAL_COMMUNITY): Payer: Self-pay

## 2024-09-23 MED ORDER — IPRATROPIUM-ALBUTEROL 0.5-2.5 (3) MG/3ML IN SOLN: 3.0000 mL | Freq: Four times a day (QID) | RESPIRATORY_TRACT | 1 refills | Status: DC | PRN

## 2024-09-23 MED ORDER — ALPRAZOLAM 1 MG PO TABS: 1.0000 mg | ORAL_TABLET | Freq: Two times a day (BID) | ORAL | 0 refills | Status: DC | PRN

## 2024-09-23 MED ORDER — ONDANSETRON 4 MG PO TBDP: 4.0000 mg | ORAL_TABLET | Freq: Three times a day (TID) | ORAL | 0 refills | Status: DC | PRN

## 2024-09-23 NOTE — Telephone Encounter (Signed)
 Patient requesting rx rf of  Alprazolam  0.5mg   Last written 08/30/2024 Duoneb Last written 06/01/2024 Zofran  4mg  Last written 08/26/2024 Last OV 09/19/2024 telelmedicine Upcoming appt 09/05/2024

## 2024-09-23 NOTE — Telephone Encounter (Unsigned)
 Copied from CRM [id number]. Topic: Clinical - Medication Refill >> Sep 23, 2024 10:04 AM Myrick T wrote: Medication: ipratropium-albuterol  (DUONEB) 0.5-2.5 (3) MG/3ML and ondansetron  (ZOFRAN -ODT) 4 MG disintegrating tablet  Has the patient contacted their pharmacy? Yes (Agent: If no, request that the patient contact the pharmacy for the refill. If patient does not wish to contact the pharmacy document the reason why and proceed with request.) (Agent: If yes, when and what did the pharmacy advise?)  This is the patient's preferred pharmacy:  [facility] [phone] - [place], [place] - [address] [place] [address] Phone: [phone] Fax: [phone]  Is this the correct pharmacy for this prescription? Yes  Has the prescription been filled recently? Yes  Is the patient out of the medication? Yes  Has the patient been seen for an appointment in the last year OR does the patient have an upcoming appointment? Yes  Can we respond through MyChart? Yes  Agent: Please be advised that Rx refills may take up to 3 business days. We ask that you follow-up with your pharmacy.

## 2024-09-23 NOTE — Telephone Encounter (Signed)
 Copied from CRM [id number]. Topic: Clinical - Medication Question >> Sep 23, 2024 10:17 AM Myrick T wrote: Reason for CRM: patient requesting a call back as she says she needs the ALPRAZolam  (XANAX ) .5 MG tablet. Medication not on her list but she says she has 1 more refill. She also needs the  ipratropium-albuterol  (DUONEB) 0.5-2.5 (3) MG/3ML SOLN and ondansetron  (ZOFRAN -ODT) 4 MG disintegrating tablet. [facility] is where she wants all her meds to go.

## 2024-09-23 NOTE — Telephone Encounter (Signed)
 Meds ordered this encounter  Medications   ALPRAZolam  (XANAX ) 1 MG tablet    Sig: Take 1 tablet (1 mg total) by mouth 2 (two) times daily as needed for anxiety.    Dispense:  60 tablet    Refill:  0    Please deliver   ipratropium-albuterol  (DUONEB) 0.5-2.5 (3) MG/3ML SOLN    Sig: Take 3 mLs by nebulization every 6 (six) hours as needed.    Dispense:  360 mL    Refill:  1   ondansetron  (ZOFRAN -ODT) 4 MG disintegrating tablet    Sig: Take 1 tablet (4 mg total) by mouth every 8 (eight) hours as needed for nausea or vomiting.    Dispense:  20 tablet    Refill:  0

## 2024-09-27 ENCOUNTER — Encounter (HOSPITAL_COMMUNITY): Payer: Self-pay

## 2024-09-27 ENCOUNTER — Other Ambulatory Visit (HOSPITAL_COMMUNITY): Payer: Self-pay

## 2024-09-28 ENCOUNTER — Telehealth (HOSPITAL_COMMUNITY): Payer: Self-pay | Admitting: Registered Nurse

## 2024-09-29 ENCOUNTER — Encounter: Payer: Self-pay | Admitting: Family Medicine

## 2024-09-29 ENCOUNTER — Other Ambulatory Visit (HOSPITAL_COMMUNITY): Payer: Self-pay

## 2024-09-29 DIAGNOSIS — Z23 Encounter for immunization: Secondary | ICD-10-CM

## 2024-09-30 MED ORDER — PNEUMOCOCCAL 20-VAL CONJ VACC 0.5 ML IM SUSY: 0.5000 mL | PREFILLED_SYRINGE | Freq: Once | INTRAMUSCULAR | 0 refills | Status: AC

## 2024-09-30 MED ORDER — ZOSTER VAC RECOMB ADJUVANTED 50 MCG/0.5ML IM SUSR: 0.5000 mL | Freq: Once | INTRAMUSCULAR | 1 refills | Status: AC

## 2024-09-30 MED ORDER — INFLUENZA VAC SPLIT HIGH-DOSE 0.5 ML IM SUSY: 0.5000 mL | PREFILLED_SYRINGE | Freq: Once | INTRAMUSCULAR | 0 refills | Status: AC

## 2024-09-30 NOTE — Telephone Encounter (Signed)
 It is showing now. That is really weird. It was still saying she was due for A1C

## 2024-09-30 NOTE — Telephone Encounter (Signed)
 Meds ordered this encounter  Medications   pneumococcal 20-valent conjugate vaccine (PREVNAR 20 ) 0.5 ML injection    Sig: Inject 0.5 mLs into the muscle once for 1 dose. DISPENSE ONLY    Dispense:  0.5 mL    Refill:  0   Influenza vac split trivalent PF (FLUZONE  HIGH-DOSE) 0.5 ML injection    Sig: Inject 0.5 mLs into the muscle once for 1 dose. DISPENSE ONLY    Dispense:  0.5 mL    Refill:  0

## 2024-09-30 NOTE — Telephone Encounter (Signed)
 Sorry about that.   Meds ordered this encounter  Medications   pneumococcal 20-valent conjugate vaccine (PREVNAR 20 ) 0.5 ML injection    Sig: Inject 0.5 mLs into the muscle once for 1 dose. DISPENSE ONLY    Dispense:  0.5 mL    Refill:  0   Influenza vac split trivalent PF (FLUZONE  HIGH-DOSE) 0.5 ML injection    Sig: Inject 0.5 mLs into the muscle once for 1 dose. DISPENSE ONLY    Dispense:  0.5 mL    Refill:  0   Zoster Vaccine Adjuvanted (SHINGRIX ) injection    Sig: Inject 0.5 mLs into the muscle once for 1 dose. Repeat in 2 months. DISPENSE ONLY    Dispense:  0.5 mL    Refill:  1

## 2024-10-03 ENCOUNTER — Encounter: Payer: Self-pay | Admitting: Family Medicine

## 2024-10-03 MED ORDER — LEVOCETIRIZINE DIHYDROCHLORIDE 5 MG PO TABS: 5.0000 mg | ORAL_TABLET | Freq: Every evening | ORAL | 1 refills | Status: DC

## 2024-10-04 ENCOUNTER — Encounter: Payer: Self-pay | Admitting: Urgent Care

## 2024-10-04 ENCOUNTER — Telehealth: Admitting: Urgent Care

## 2024-10-04 DIAGNOSIS — J0100 Acute maxillary sinusitis, unspecified: Secondary | ICD-10-CM

## 2024-10-04 MED ORDER — CEFDINIR 300 MG PO CAPS: 300.0000 mg | ORAL_CAPSULE | Freq: Two times a day (BID) | ORAL | 0 refills | Status: AC

## 2024-10-04 MED ORDER — PREDNISONE 20 MG PO TABS: 40.0000 mg | ORAL_TABLET | Freq: Every day | ORAL | 0 refills | Status: DC

## 2024-10-04 NOTE — Patient Instructions (Addendum)
 Please start taking the antibiotic, cefdinir , twice daily with food. Take it for all 10 days, do not stop early just because you feel better. Take an over the counter probiotic or yogurt daily to help prevent diarrhea/ yeast infection.  Start taking prednisone  once daily with breakfast to help clear up the mucous. Use Flonase  daily to help with inflammation of the nasal passage. It is also recommended that you use nasal saline/ sinus washes to cleans the sinus passages.  Hot steam from a shower or vaporizer may also be beneficial to help open up the upper airway. Eucalyptus can be helpful.  Use an air purifier system to remove irritants from your apartment. Change the air filters at least every 3 months.    Notify our office if your symptoms persist after completion of the above treatment.

## 2024-10-04 NOTE — Progress Notes (Signed)
 Virtual Visit via Video Note  I connected with Krystal Burgess on 10/04/24 at  8:40 AM EDT by a video enabled telemedicine application and verified that I am speaking with the correct person using two identifiers.  Patient Location: Home Provider Location: Office/Clinic  I discussed the limitations, risks, security, and privacy concerns of performing an evaluation and management service by video and the availability of in person appointments. I also discussed with the patient that there may be a patient responsible charge related to this service. The patient expressed understanding and agreed to proceed.   Subjective  PCP: Alvan Dorothyann BIRCH, MD  Chief Complaint  Patient presents with   Sinusitis    HPI:   Discussed the use of AI scribe software for clinical note transcription with the patient, who gave verbal consent to proceed.  History of Present Illness   Krystal Burgess is a 63 year old female who presents with sinus congestion and pain.  She experiences frequent sinus infections, with the current episode causing significant congestion and pain, particularly in the sinus area. She describes a sensation of fullness and an inability to clear her sinuses, which has disrupted her sleep as she was up and down all night trying to alleviate the symptoms.  She completed a course of cefdinir  and prednisone  prescribed two weeks ago, which provided temporary relief. However, the symptoms have returned. She uses a saline rinse more frequently now, especially before bed, to manage her symptoms. She also takes Tylenol  for relief, although its effectiveness varies.  She recently moved into a new apartment that was not cleaned well, which she believes contributes to her sinus issues.  She has been experiencing difficulty walking after a recent hospital stay where she was bedridden for a month, leading to decreased mobility. She is currently in physical therapy to improve her  walking ability. She is concerned about the possibility of developing pneumonia if current sinus infection not cleared.        ROS: Per HPI. All other pertinent systems are negative.   Current Outpatient Medications:    albuterol  (VENTOLIN  HFA) 108 (90 Base) MCG/ACT inhaler, Inhale 2 puffs into the lungs every 6 (six) hours as needed for wheezing or shortness of breath., Disp: 8 g, Rfl: 0   ALPRAZolam  (XANAX ) 1 MG tablet, Take 1 tablet (1 mg total) by mouth 2 (two) times daily as needed for anxiety., Disp: 60 tablet, Rfl: 0   AMBULATORY NON FORMULARY MEDICATION, Battery powered portable oxygen  concentrator, Disp: 1 each, Rfl: 0   AMBULATORY NON FORMULARY MEDICATION, Medication Name: Needs labs drawn: CBC w/ diff, CMP, TSH, A1C, B12, B1, B6, Magensium, folate, Iron panel, Lipid panel, urine microalbumin, Disp: 1 Units, Rfl: 1   Azelastine  HCl 137 MCG/SPRAY SOLN, Place into both nostrils., Disp: , Rfl:    Blood Glucose Monitoring Suppl DEVI, 1 each by Does not apply route daily as needed. Include strips and lancets to test once daily.  Dx. Diabetes. May substitute to any manufacturer covered by patient's insurance., Disp: 1 each, Rfl: 0   buprenorphine (BUTRANS) 20 MCG/HR PTWK, Place 1 patch onto the skin once a week., Disp: , Rfl:    cefdinir  (OMNICEF ) 300 MG capsule, Take 1 capsule (300 mg total) by mouth 2 (two) times daily for 10 days., Disp: 20 capsule, Rfl: 0   diclofenac  (VOLTAREN ) 75 MG EC tablet, Take 75 mg by mouth 2 (two) times daily., Disp: , Rfl:    fluticasone  (FLONASE ) 50 MCG/ACT nasal  spray, SPRAY 2 SPRAYS INTO EACH NOSTRIL EVERY DAY, Disp: 48 mL, Rfl: 1   furosemide  (LASIX ) 20 MG tablet, Take 20 mg by mouth every other day. (Patient not taking: Reported on 09/01/2024), Disp: , Rfl:    gabapentin  (NEURONTIN ) 600 MG tablet, Take 600 mg by mouth 3 (three) times daily., Disp: , Rfl:    ipratropium (ATROVENT ) 0.06 % nasal spray, Place 2 sprays into both nostrils 3 (three) times daily.,  Disp: 45 mL, Rfl: 11   ipratropium-albuterol  (DUONEB) 0.5-2.5 (3) MG/3ML SOLN, Take 3 mLs by nebulization every 6 (six) hours as needed., Disp: 360 mL, Rfl: 1   levocetirizine (XYZAL ) 5 MG tablet, Take 1 tablet (5 mg total) by mouth every evening., Disp: 90 tablet, Rfl: 1   levothyroxine  (SYNTHROID ) 88 MCG tablet, Take 1 tablet (88 mcg total) by mouth daily., Disp: 30 tablet, Rfl: 0   losartan  (COZAAR ) 100 MG tablet, Take 1 tablet (100 mg total) by mouth daily., Disp: 90 tablet, Rfl: 1   ondansetron  (ZOFRAN -ODT) 4 MG disintegrating tablet, Take 1 tablet (4 mg total) by mouth every 8 (eight) hours as needed for nausea or vomiting., Disp: 20 tablet, Rfl: 0   pantoprazole  (PROTONIX ) 40 MG tablet, Take 1 tablet (40 mg total) by mouth daily., Disp: 90 tablet, Rfl: 3   predniSONE  (DELTASONE ) 20 MG tablet, Take 2 tablets (40 mg total) by mouth daily with breakfast., Disp: 14 tablet, Rfl: 0   promethazine  (PHENERGAN ) 25 MG tablet, Take 25 mg by mouth every 6 (six) hours as needed for nausea or vomiting., Disp: , Rfl:    rosuvastatin  (CRESTOR ) 20 MG tablet, Take 1 tablet (20 mg total) by mouth at bedtime., Disp: 90 tablet, Rfl: 3   Sodium Chloride -Sodium Bicarb 1.57 g PACK, Nasal saline irrigation nightly., Disp: 100 each, Rfl: PRN   tiZANidine  (ZANAFLEX ) 2 MG tablet, Take 1 tablet (2 mg total) by mouth 2 (two) times daily as needed for muscle spasms. Use sparingly., Disp: 45 tablet, Rfl: 0   traZODone  (DESYREL ) 50 MG tablet, Take 1 tablet (50 mg total) by mouth at bedtime as needed for sleep., Disp: 90 tablet, Rfl: 0   TRELEGY ELLIPTA  100-62.5-25 MCG/ACT AEPB, INHALE 1 PUFF BY MOUTH EVERY DAY, Disp: 180 each, Rfl: 1   Vilazodone  HCl (VIIBRYD ) 40 MG TABS, Take 1 tablet (40 mg total) by mouth daily., Disp: 90 tablet, Rfl: 0    Objective  Vital signs not able to be obtained due to this being a virtual visit.  Physical Exam Vitals reviewed.  Constitutional:      General: She is not in acute  distress. HENT:     Nose: Congestion present.     Comments: Nasal quality voice Eyes:     General: No scleral icterus.       Right eye: No discharge.        Left eye: No discharge.  Pulmonary:     Effort: Pulmonary effort is normal. No respiratory distress.     Comments: Pt able to speak in complete sentences without breathlessness. O2 via nasal cannula Skin:    Coloration: Skin is not jaundiced or pale.  Neurological:     Mental Status: She is alert and oriented to person, place, and time.  Psychiatric:        Mood and Affect: Mood normal.        Behavior: Behavior normal.      Assessment & Plan Acute non-recurrent maxillary sinusitis  Orders:   cefdinir  (OMNICEF ) 300 MG capsule;  Take 1 capsule (300 mg total) by mouth 2 (two) times daily for 10 days.   predniSONE  (DELTASONE ) 20 MG tablet; Take 2 tablets (40 mg total) by mouth daily with breakfast.  Assessment and Plan    Recurrent acute sinusitis Recurrent acute sinusitis with nasal congestion, facial pain, and decreased oxygen  levels. Symptoms likely exacerbated by environmental factors in her new apartment. - Prescribe cefdinir  for 10 days. - Prescribe prednisone  40 mg for 7 days. - Recommend saline rinse and humidifier. - Advise purchase of an air purifier for the apartment. - Instruct to monitor symptoms and contact if no improvement in 10 days for potential imaging. - Advise use of a probiotic to prevent antibiotic-associated diarrhea. - Instruct to take antibiotics with food to minimize gastrointestinal upset.        No follow-ups on file.   I discussed the assessment and treatment plan with the patient. The patient was provided an opportunity to ask questions, and all were answered. The patient agreed with the plan and demonstrated an understanding of the instructions.   The patient was advised to call back or seek an in-person evaluation if the symptoms worsen or if the condition fails to improve as  anticipated.  The above assessment and management plan was discussed with the patient. The patient verbalized understanding of and has agreed to the management plan.   Benton LITTIE Gave, PA

## 2024-10-05 ENCOUNTER — Encounter: Payer: Self-pay | Admitting: Family Medicine

## 2024-10-05 DIAGNOSIS — R000 Tachycardia, unspecified: Secondary | ICD-10-CM

## 2024-10-06 ENCOUNTER — Ambulatory Visit: Attending: Family Medicine

## 2024-10-06 DIAGNOSIS — R000 Tachycardia, unspecified: Secondary | ICD-10-CM

## 2024-10-06 NOTE — Progress Notes (Unsigned)
 EP to read.

## 2024-10-06 NOTE — Telephone Encounter (Signed)
 Orders Placed This Encounter  Procedures   LONG TERM MONITOR XT (3-14 DAYS)    Standing Status:   Future    Number of Occurrences:   1    Expiration Date:   10/06/2025    Where should this test be performed?:   [facility]    Does the patient have an implanted cardiac device?:   No    Prescribed days of wear:   14    Type of enrollment:   Home Enrollment    Vendor::   [employer]    Reason for Exam:   Palpitations R00.2

## 2024-10-07 NOTE — Telephone Encounter (Signed)
 Ok to give vaccines once off sterioids for 10 days

## 2024-10-10 ENCOUNTER — Telehealth: Payer: Self-pay | Admitting: Family Medicine

## 2024-10-10 NOTE — Telephone Encounter (Signed)
 Is she sure she does not need a specific form completed and that a letter will suffice?

## 2024-10-10 NOTE — Telephone Encounter (Signed)
 Copied from CRM #[id number]. Topic: General - Other >> Oct 10, 2024  1:59 PM Lauren C wrote: Reason for CRM: Pt is homebound in an upstairs apartment. The mail receptacle is downstairs, so she has been missing a lot of her mail. She needs a letter to be sent to the post office for the postmaster to let them know that she is homebound and can't go up steps to check her mail. Requesting for mail to be placed in between storm door and the door to her apartment and ring doorbell. The address this needs to be sent to is:  Care of: Postmaster [address] [place]  [address]  If any questions, please reach out to pt at [phone]

## 2024-10-11 NOTE — Telephone Encounter (Signed)
 OK for letter. Thank you Luke

## 2024-10-11 NOTE — Telephone Encounter (Signed)
 Spoke with patient. She states she was told by [facility] postal office that will only have to be a letter on company letterhead with doctors signature .  She was told by the 800 number that she would need a form but [place] post office told her that that was not necessary and the letter would suffice.  O.k. for me to write and mail the letter as requested from patient?

## 2024-10-12 ENCOUNTER — Ambulatory Visit (HOSPITAL_COMMUNITY): Admitting: Registered Nurse

## 2024-10-12 ENCOUNTER — Encounter (HOSPITAL_COMMUNITY): Payer: Self-pay | Admitting: Registered Nurse

## 2024-10-12 DIAGNOSIS — G4700 Insomnia, unspecified: Secondary | ICD-10-CM

## 2024-10-12 DIAGNOSIS — F330 Major depressive disorder, recurrent, mild: Secondary | ICD-10-CM

## 2024-10-12 DIAGNOSIS — F411 Generalized anxiety disorder: Secondary | ICD-10-CM | POA: Diagnosis not present

## 2024-10-12 MED ORDER — TRAZODONE HCL 50 MG PO TABS: 50.0000 mg | ORAL_TABLET | Freq: Every evening | ORAL | 0 refills | Status: DC | PRN

## 2024-10-12 MED ORDER — HYDROXYZINE HCL 10 MG PO TABS: ORAL_TABLET | ORAL | 0 refills | Status: DC

## 2024-10-12 MED ORDER — VILAZODONE HCL 40 MG PO TABS: 40.0000 mg | ORAL_TABLET | Freq: Every day | ORAL | 0 refills | Status: DC

## 2024-10-12 MED ORDER — BUSPIRONE HCL 5 MG PO TABS: 5.0000 mg | ORAL_TABLET | Freq: Two times a day (BID) | ORAL | 0 refills | Status: DC

## 2024-10-12 NOTE — Progress Notes (Signed)
 Psychiatric Initial Adult Assessment   Patient Identification: FAYTH TREFRY MRN:  [id number]  Virtual Visit via Video Note  I connected with Avelina LELON Sharps on 10/12/24 at  1:00 PM EDT by a video enabled telemedicine application and verified that I am speaking with the correct person using two identifiers.  Location: Patient: Home Provider: Home office   I discussed the limitations of evaluation and management by telemedicine and the availability of in person appointments. The patient expressed understanding and agreed to proceed.  I discussed the assessment and treatment plan with the patient. The patient was provided an opportunity to ask questions and all were answered. The patient agreed with the plan and demonstrated an understanding of the instructions.   The patient was advised to call back or seek an in-person evaluation if the symptoms worsen or if the condition fails to improve as anticipated.  I provided 60 minutes of non-face-to-face time during this encounter.   [name], NP  Date of Evaluation:  10/12/2024 Referral Source: Alvan Dorothyann BIRCH, MD [facility] Chief Complaint:   Chief Complaint  Patient presents with   Establish Care    Medication management   Visit Diagnosis:    ICD-10-CM   1. GAD (generalized anxiety disorder)  F41.1 busPIRone  (BUSPAR ) 5 MG tablet    2. Major depressive disorder, recurrent, mild  F33.0 Vilazodone  HCl (VIIBRYD ) 40 MG TABS    busPIRone  (BUSPAR ) 5 MG tablet    hydrOXYzine (ATARAX) 10 MG tablet    3. Insomnia, unspecified type  G47.00 traZODone  (DESYREL ) 50 MG tablet      History of Present Illness:  ALZENA GERBER 63 y.o. female presents today to establish care for medication management.  She was seen via virtual video visit by this provide and chart reviewed on 10/12/24  Her psychiatric history is significant for major depression, general anxiety, panic attack, insomnia.  Her mental health  is currently managed with Viibryd  40 mg daily and trazodone  50 mg daily at bedtime as needed.  She reports current medications are managing her depression effectively without any adverse reaction.  She reports at this time her anxiety has worsen and she needs a medication to help with anxiety and nervousness.  She reports her main stressor is being alone only and not being able to do things that she used to related to her physical health.  She goes on to tell about being hospitalized twice last year the summer 2025 and once she was discharged from the hospital the first time she was evicted from her daughter's home.  After second discharge from hospital she was declared homeless and she lived in a motel up until recently where she is now living in an apartment alone.  She reports her family is estranged and she has not seen her daughter or her grandchild since December 2025.  She reports she feels more content and is happy now related to God opening up doors that I did not even know what they are.  I am a Christian.  I am living on my own in my own apartment, my sister-in-law lives 2 doors up and then my next-door neighbor who are both helpful.  Patient reports she just needs to get started on medications that are going to help with her anxiety and she also wants to get into therapy. Today she denies suicidal/self-harm/homicidal ideation, psychosis, paranoia, and abnormal movement.  Screenings completed during today's visit PHQ-9, C-SSRS, GAD-7, AIMS, AUDIT, Nutrition, and Pain, see scores  below.    Recommendations: Continue Viibryd  40 mg daily and trazodone  50 mg nightly as needed.  Started BuSpar  5 mg twice daily and Vistaril 10 mg to 20 mg 3 times daily as needed She was educated on the side effect and efficacy profile of BuSpar , Vistaril and educational material was added to AVS. Informed that therapeutic effects may take several weeks to become noticeable.  She voiced understanding and agreement with  today's plan and recommendations.  Associated Signs/Symptoms: Depression Symptoms:  depressed mood, anxiety, panic attacks, (Hypo) Manic Symptoms:  Denies Anxiety Symptoms:  Excessive Worry, Panic Symptoms, Psychotic Symptoms:  Denies PTSD Symptoms: NA  Past Psychiatric History:  Diagnosis: Major depression, general anxiety, insomnia, panic attacks Suicide attempt: Denies Non-suicidal self-injurious behavior: Denies Psychiatric hospitalization: Reports she has had 2 psychiatric hospitalizations when she was younger 1 for depression and 1 where she was hospitalized by her mother but did not need to be there Past trauma: Denies Substance abuse: Denies Past psychotropic medication trials: Reports she has been on so many psychotropic medications that she cannot remember the name of all of them.  Other than the current medication she is taking the only name she could remember was Zoloft .  She reports the Viibryd  has worked better than any medication she is taking her past and continues to work for her.  Reports she has been taking it for 1 year now.  Previous Psychotropic Medications: Yes   Substance Abuse History in the last 12 months:  No.  Consequences of Substance Abuse: NA  Past Medical History:  Past Medical History:  Diagnosis Date   Allergy    COPD (chronic obstructive pulmonary disease) (HCC)    Glaucoma    Hyperlipidemia    Hypertension    MRSA (methicillin resistant staph aureus) culture positive    Pain    Renal disorder    Thyroid  disease     Past Surgical History:  Procedure Laterality Date   CESAREAN SECTION     CHOLECYSTECTOMY     lipoma removal     RT shoulder   LUMBAR SPINE SURGERY  12/23/2011   Dr. Delores, L4-5   pilonydal cyst  12/22/1992   TOTAL ABDOMINAL HYSTERECTOMY  1999   for abnormal cervical cells and fibroids    Family Psychiatric History: See below and family history  Family History:  Family History  Problem Relation Age of Onset    Diabetes Brother    Depression Brother        bipolar   Cancer Brother        throat   Diabetes Other        grandmother   Depression Mother    Hyperlipidemia Mother    Hypertension Mother    Pancreatic cancer Mother    Hypertension Father     Social History:   Social History   Socioeconomic History   Marital status: Divorced    Spouse name: Not on file   Number of children: 1   Years of education: 1   Highest education level: 12th grade  Occupational History   Occupation: Disabled  Tobacco Use   Smoking status: Former    Current packs/day: 0.00    Average packs/day: 1 pack/day for 38.6 years (38.6 ttl pk-yrs)    Types: Cigarettes    Start date: 07/30/1981    Quit date: 03/19/2020    Years since quitting: 4.5   Smokeless tobacco: Never  Vaping Use   Vaping status: Some Days   Substances: Nicotine   Substance and Sexual Activity   Alcohol use: Not Currently   Drug use: No   Sexual activity: Not Currently  Other Topics Concern   Not on file  Social History Narrative   Lives alone. She enjoys watching TV.   Social Drivers of Corporate investment banker Strain: Low Risk  (09/01/2024)   Overall Financial Resource Strain (CARDIA)    Difficulty of Paying Living Expenses: Not very hard  Food Insecurity: No Food Insecurity (09/01/2024)   Hunger Vital Sign    Worried About Running Out of Food in the Last Year: Never true    Ran Out of Food in the Last Year: Never true  Transportation Needs: No Transportation Needs (05/24/2024)   Received from [facility] - Transportation    Lack of Transportation (Medical): No    Lack of Transportation (Non-Medical): No  Physical Activity: Inactive (09/01/2024)   Exercise Vital Sign    Days of Exercise per Week: 0 days    Minutes of Exercise per Session: 0 min  Stress: Stress Concern Present (09/01/2024)   [employer] of Occupational Health - Occupational Stress Questionnaire    Feeling of Stress: Rather much  Social  Connections: Socially Isolated (09/01/2024)   Social Connection and Isolation Panel    Frequency of Communication with Friends and Family: More than three times a week    Frequency of Social Gatherings with Friends and Family: Once a week    Attends Religious Services: Never    Database administrator or Organizations: No    Attends Banker Meetings: Never    Marital Status: Divorced    Additional Social History: Reports she is unemployed on disability since 2001.  Lives on second floor at apartments has difficulty going up/down stairs related to her physical abilities at this time  Allergies:   Allergies  Allergen Reactions   Risperidone  And Paliperidone Shortness Of Breath   Aripiprazole Other (See Comments)    REACTION: muscle jerks    Fluoxetine Hcl Other (See Comments)    REACTION: Intolerant   Nsaids Other (See Comments)    Upper GI bleed CKD 3    Paroxetine Other (See Comments)    REACTION: Intolerance    Ziprasidone  Hcl Other (See Comments)    shakes    Chantix  [Varenicline  Tartrate] Other (See Comments)    messing with my mood   Metformin And Related Nausea Only   Montelukast  Other (See Comments)    it kept me sick with an upper respiratory infection   Onglyza [Saxagliptin ] Nausea Only   Augmentin  [Amoxicillin -Pot Clavulanate] Other (See Comments)    Gets yeast infection every time    Fluoxetine Hcl     REACTION: Intolerant    Metabolic Disorder Labs: Reviewed Lab Results  Component Value Date   HGBA1C 5.5 01/16/2023   MPG 111.15 01/16/2023   MPG 128.37 03/02/2021   No results found for: PROLACTIN Lab Results  Component Value Date   CHOL 109 11/28/2021   TRIG 175 (H) 11/28/2021   HDL 46 (L) 11/28/2021   CHOLHDL 2.4 11/28/2021   VLDL 45 (H) 11/01/2015   LDLCALC 38 11/28/2021   LDLCALC 47 09/20/2020   Lab Results  Component Value Date   TSH 4.64 09/02/2024      Current Medications: Current Outpatient Medications  Medication  Sig Dispense Refill   albuterol  (VENTOLIN  HFA) 108 (90 Base) MCG/ACT inhaler Inhale 2 puffs into the lungs every 6 (six) hours as needed for wheezing or  shortness of breath. 8 g 0   ALPRAZolam  (XANAX ) 1 MG tablet Take 1 tablet (1 mg total) by mouth 2 (two) times daily as needed for anxiety. 60 tablet 0   Azelastine  HCl 137 MCG/SPRAY SOLN Place into both nostrils.     buprenorphine (BUTRANS) 20 MCG/HR PTWK Place 1 patch onto the skin once a week.     busPIRone  (BUSPAR ) 5 MG tablet Take 1 tablet (5 mg total) by mouth 2 (two) times daily. 180 tablet 0   cefdinir  (OMNICEF ) 300 MG capsule Take 1 capsule (300 mg total) by mouth 2 (two) times daily for 10 days. 20 capsule 0   diclofenac  (VOLTAREN ) 75 MG EC tablet Take 75 mg by mouth 2 (two) times daily.     fluticasone  (FLONASE ) 50 MCG/ACT nasal spray SPRAY 2 SPRAYS INTO EACH NOSTRIL EVERY DAY 48 mL 1   gabapentin  (NEURONTIN ) 600 MG tablet Take 600 mg by mouth 3 (three) times daily.     hydrOXYzine (ATARAX) 10 MG tablet Take 10 mg to 20 mg (1-2 tablets) three times daily as needed 180 tablet 0   ipratropium (ATROVENT ) 0.06 % nasal spray Place 2 sprays into both nostrils 3 (three) times daily. 45 mL 11   ipratropium-albuterol  (DUONEB) 0.5-2.5 (3) MG/3ML SOLN Take 3 mLs by nebulization every 6 (six) hours as needed. 360 mL 1   levocetirizine (XYZAL ) 5 MG tablet Take 1 tablet (5 mg total) by mouth every evening. 90 tablet 1   levothyroxine  (SYNTHROID ) 88 MCG tablet Take 1 tablet (88 mcg total) by mouth daily. 30 tablet 0   losartan  (COZAAR ) 100 MG tablet Take 1 tablet (100 mg total) by mouth daily. 90 tablet 1   ondansetron  (ZOFRAN -ODT) 4 MG disintegrating tablet Take 1 tablet (4 mg total) by mouth every 8 (eight) hours as needed for nausea or vomiting. 20 tablet 0   pantoprazole  (PROTONIX ) 40 MG tablet Take 1 tablet (40 mg total) by mouth daily. 90 tablet 3   promethazine  (PHENERGAN ) 25 MG tablet Take 25 mg by mouth every 6 (six) hours as needed for nausea  or vomiting.     rosuvastatin  (CRESTOR ) 20 MG tablet Take 1 tablet (20 mg total) by mouth at bedtime. 90 tablet 3   theophylline  (THEODUR) 300 MG 12 hr tablet Take 300 mg by mouth 2 (two) times daily.     tiZANidine  (ZANAFLEX ) 2 MG tablet Take 1 tablet (2 mg total) by mouth 2 (two) times daily as needed for muscle spasms. Use sparingly. 45 tablet 0   TRELEGY ELLIPTA  100-62.5-25 MCG/ACT AEPB INHALE 1 PUFF BY MOUTH EVERY DAY 180 each 1   AMBULATORY NON FORMULARY MEDICATION Battery powered portable oxygen  concentrator 1 each 0   AMBULATORY NON FORMULARY MEDICATION Medication Name: Needs labs drawn: CBC w/ diff, CMP, TSH, A1C, B12, B1, B6, Magensium, folate, Iron panel, Lipid panel, urine microalbumin 1 Units 1   Blood Glucose Monitoring Suppl DEVI 1 each by Does not apply route daily as needed. Include strips and lancets to test once daily.  Dx. Diabetes. May substitute to any manufacturer covered by patient's insurance. 1 each 0   furosemide  (LASIX ) 20 MG tablet Take 20 mg by mouth every other day. (Patient not taking: Reported on 10/12/2024)     predniSONE  (DELTASONE ) 20 MG tablet Take 2 tablets (40 mg total) by mouth daily with breakfast. (Patient not taking: Reported on 10/12/2024) 14 tablet 0   Sodium Chloride -Sodium Bicarb 1.57 g PACK Nasal saline irrigation nightly. 100 each PRN  traZODone  (DESYREL ) 50 MG tablet Take 1 tablet (50 mg total) by mouth at bedtime as needed for sleep. 90 tablet 0   Vilazodone  HCl (VIIBRYD ) 40 MG TABS Take 1 tablet (40 mg total) by mouth daily. 90 tablet 0   No current facility-administered medications for this visit.    Musculoskeletal: Strength & Muscle Tone: Unable to assess via virtual visit Gait & Station: Unable to assess via virtual visit Patient leans: N/A  Psychiatric Specialty Exam: Review of Systems  Constitutional:        No other complaints voiced at this time  Respiratory:         Wearing nasal cannula history of COPD  Musculoskeletal:   Positive for arthralgias and myalgias.       Difficult going up and down stairs.  Reports needs assistance  Psychiatric/Behavioral:  Negative for agitation, hallucinations, self-injury, sleep disturbance and suicidal ideas. Dysphoric mood: Reports depression is stable.The patient is nervous/anxious.   All other systems reviewed and are negative.   There were no vitals taken for this visit.There is no height or weight on file to calculate BMI.  General Appearance: Casual  Eye Contact:  Good  Speech:  Clear and Coherent and Normal Rate  Volume:  Normal  Mood:  Anxious and Dysphoric  Affect:  Congruent  Thought Process:  Coherent, Goal Directed, and Descriptions of Associations: Intact  Orientation:  Full (Time, Place, and Person)  Thought Content:  Logical  Suicidal Thoughts:  No  Homicidal Thoughts:  No  Memory:  Immediate;   Good Recent;   Good Remote;   Good  Judgement:  Intact  Insight:  Present  Psychomotor Activity:  Normal  Concentration:  Concentration: Good and Attention Span: Good  Recall:  Good  Fund of Knowledge:Good  Language: Good  Akathisia:  No  Handed:  Right  AIMS (if indicated):  done  Assets:  Communication Skills Desire for Improvement Financial Resources/Insurance Housing Leisure Time Resilience Social Support  ADL's:  Intact  Cognition: WNL  Sleep:  Good   Screenings: AIMS    Loss adjuster, chartered Office Visit from 10/12/2024 in [facility] at [facility]  AIMS Total Score 0   GAD-7    Flowsheet Row Office Visit from 10/12/2024 in [facility] at [facility] Video Visit from 12/22/2023 in [facility] Medicine at [facility] Video Visit from 09/08/2023 in [facility] at [facility] Video Visit from 07/01/2023 in [facility] at [facility] Office Visit from  12/16/2022 in [facility] at [facility]  Total GAD-7 Score 16 19 14 16 14    PHQ2-9    Flowsheet Row Office Visit from 10/12/2024 in [facility] at [facility] Clinical Support from 09/01/2024 in [facility] at [facility] Video Visit from 12/22/2023 in [facility] Primary Care & [facility] Medicine at [facility] Video Visit from 09/08/2023 in [facility] Primary Care & Sports Medicine at [facility] Office Visit from 08/25/2023 in [facility] Primary Care & Sports Medicine at [facility] Total Score 4 6 6 5 6   PHQ-9 Total Score 7 21 26 20 24    Flowsheet Row Office Visit from 10/12/2024 in [facility] at [facility] ED to Hosp-Admission (Discharged) from 12/29/2023 in [facility] Office Visit from 08/25/2023 in [facility] at [facility]  C-SSRS RISK CATEGORY No Risk No Risk No Risk    Assessment and Plan:  Assessment: Summary of today's assessment: BONA HUBBARD appears to be doing fairly well.  Reported current medications are managing her depression effectively without any adverse reaction.  Reports she needs medications to help with her anxiety.  Reporting primary stressor is feeling lonely, and this is the first time she has ever lived alone in her life which increases her anxiety.  She reports she is eating and sleeping without any difficulty.  She denies suicidal/self-harm/homicidal ideation, psychosis, paranoia, and abnormal movement.  During visit she was dressed appropriate for age and weather.  She was seated comfortably in view of camera with no noted distress.  She was alert/oriented x 4, calm/cooperative and mood congruent with affect.  She spoke in a clear tone at moderate volume, and normal pace, with good eye contact.  Her  thought process was coherent, relevant, and there was no indication that she was responding to internal/external stimuli or experiencing delusional thought content.  1. GAD (generalized anxiety disorder) (Primary) - busPIRone  (BUSPAR ) 5 MG tablet; Take 1 tablet (5 mg total) by mouth 2 (two) times daily.  Dispense: 180 tablet; Refill: 0  2. Major depressive disorder, recurrent, mild - Vilazodone  HCl (VIIBRYD ) 40 MG TABS; Take 1 tablet (40 mg total) by mouth daily.  Dispense: 90 tablet; Refill: 0 - busPIRone  (BUSPAR ) 5 MG tablet; Take 1 tablet (5 mg total) by mouth 2 (two) times daily.  Dispense: 180 tablet; Refill: 0 - hydrOXYzine (ATARAX) 10 MG tablet; Take 10 mg to 20 mg (1-2 tablets) three times daily as needed  Dispense: 180 tablet; Refill: 0  3. Insomnia, unspecified type - traZODone  (DESYREL ) 50 MG tablet; Take 1 tablet (50 mg total) by mouth at bedtime as needed for sleep.  Dispense: 90 tablet; Refill: 0       Plan: Medication management: Meds ordered this encounter  Medications   Vilazodone  HCl (VIIBRYD ) 40 MG TABS    Sig: Take 1 tablet (40 mg total) by mouth daily.    Dispense:  90 tablet    Refill:  0    Supervising Provider:   ARFEEN, SYED T [[id number]]   traZODone  (DESYREL ) 50 MG tablet    Sig: Take 1 tablet (50 mg total) by mouth at bedtime as needed for sleep.    Dispense:  90 tablet    Refill:  0    Supervising Provider:   CURRY, SYED T [[id number]]   busPIRone  (BUSPAR ) 5 MG tablet    Sig: Take 1 tablet (5 mg total) by mouth 2 (two) times daily.    Dispense:  180 tablet    Refill:  0    Supervising Provider:   ARFEEN, SYED T [[id number]]   hydrOXYzine (ATARAX) 10 MG tablet    Sig: Take 10 mg to 20 mg (1-2 tablets) three times daily as needed    Dispense:  180 tablet    Refill:  0    Supervising Provider:   CURRY PATERSON T [[id number]]   Medications Discontinued During This Encounter  Medication Reason   traZODone  (DESYREL ) 50 MG tablet Reorder   Vilazodone  HCl (VIIBRYD ) 40 MG TABS  Reorder    Labs:  Most recent labs reviewed.  Lab orders not indicated at this time.      Other:  Counseling/Therapy:  Referral made for counseling/therapy ILISHA BLUST was instructed to call [phone], mobile crisis,  or present to the nearest emergency room should she experiences any suicidal/homicidal ideation, auditory/visual/hallucinations, or detrimental worsening of her mental health condition.   Avelina LELON Sharps participated in the development of this treatment plan and verbalized her understanding/agreement with plan as listed.   Follow Up: Return in 1 month for medication management Call in the interim for any side-effects, decompensation, questions, or problems  Collaboration of Care: Medication Management AEB medication assessment, refills, started BuSpar  and Vistaril and Referral or follow-up with counselor/therapist AEB referral to counseling/therapy  Patient/Guardian was advised Release of Information must be obtained prior to any record release in order to collaborate their care with an outside provider. Patient/Guardian was advised if they have not already done so to contact the registration department to sign all necessary forms in order for us  to release information regarding their care.   Consent: Patient/Guardian gives verbal consent for treatment and assignment of benefits for services provided during this visit. Patient/Guardian expressed understanding and agreed to proceed.   Aleaha Fickling, NP 10/22/20252:07 PM

## 2024-10-12 NOTE — Telephone Encounter (Signed)
 Letter was signed by the provider and placed in mail  to [facility]

## 2024-10-12 NOTE — Telephone Encounter (Signed)
 Letter has been written.

## 2024-10-12 NOTE — Patient Instructions (Signed)

## 2024-10-14 ENCOUNTER — Encounter: Payer: Self-pay | Admitting: Family Medicine

## 2024-10-14 DIAGNOSIS — J449 Chronic obstructive pulmonary disease, unspecified: Secondary | ICD-10-CM | POA: Diagnosis not present

## 2024-10-14 DIAGNOSIS — J9620 Acute and chronic respiratory failure, unspecified whether with hypoxia or hypercapnia: Secondary | ICD-10-CM | POA: Diagnosis not present

## 2024-10-14 NOTE — Telephone Encounter (Signed)
 See other MyChart message

## 2024-10-15 DIAGNOSIS — J9620 Acute and chronic respiratory failure, unspecified whether with hypoxia or hypercapnia: Secondary | ICD-10-CM | POA: Diagnosis not present

## 2024-10-15 DIAGNOSIS — J449 Chronic obstructive pulmonary disease, unspecified: Secondary | ICD-10-CM | POA: Diagnosis not present

## 2024-10-18 ENCOUNTER — Telehealth (HOSPITAL_COMMUNITY): Payer: Self-pay | Admitting: *Deleted

## 2024-10-18 DIAGNOSIS — J449 Chronic obstructive pulmonary disease, unspecified: Secondary | ICD-10-CM | POA: Diagnosis not present

## 2024-10-18 NOTE — Telephone Encounter (Signed)
 Patient called stating the medication that provider put her on she absolutely can not take it. Per pt she have a hernia and they bloats up her stomach and cause her anxiety to increase. Per pt if provider can give her the alprazolam , then she's good with her antidepressants and anxiety. Per pt the xanax  and the Viibryd  40mg  is what works for her. Per pt if the provider could prescribe her 1mg  of the Xanax  up to 3 times a day with her antidepressants, she will be perfect.

## 2024-10-19 ENCOUNTER — Telehealth: Payer: Self-pay

## 2024-10-19 ENCOUNTER — Encounter: Payer: Self-pay | Admitting: Family Medicine

## 2024-10-19 NOTE — Telephone Encounter (Signed)
 Spoke with Randine RN with [facility] home health  Clarified previous message from [place]   She states the patient does not want all of the vaccines given at one time . She is wanting to know if  o.k. to give one immunization per week starting with the flu vaccine?  ( I did also inform her that third vaccine should be shingles vaccine)

## 2024-10-19 NOTE — Telephone Encounter (Signed)
 Copied from CRM [id number]. Topic: Clinical - Home Health Verbal Orders >> Oct 18, 2024  4:46 PM Tinnie BROCKS wrote: Caller/Agency: Randine, RN with [facility] Callback Number: [phone]  She says patient has 3 vaccines at her home for her to give. How do we want them given? She says the vaccines are flu, pneumonia, and another that might be sars but she does not remember. Please return call to clarify.

## 2024-10-21 NOTE — Telephone Encounter (Signed)
 Yes, okay to separate by 1 week each

## 2024-10-21 NOTE — Telephone Encounter (Signed)
Called and informed pt of recommendations.

## 2024-10-22 ENCOUNTER — Other Ambulatory Visit: Payer: Self-pay | Admitting: Family Medicine

## 2024-10-23 ENCOUNTER — Encounter: Payer: Self-pay | Admitting: Family Medicine

## 2024-10-24 ENCOUNTER — Ambulatory Visit: Payer: Self-pay

## 2024-10-24 MED ORDER — PROMETHAZINE HCL 25 MG PO TABS: 25.0000 mg | ORAL_TABLET | Freq: Four times a day (QID) | ORAL | 0 refills | Status: DC | PRN

## 2024-10-24 MED ORDER — ONDANSETRON 4 MG PO TBDP: 4.0000 mg | ORAL_TABLET | Freq: Three times a day (TID) | ORAL | 0 refills | Status: DC | PRN

## 2024-10-24 NOTE — Telephone Encounter (Signed)
 Summary: Symptomatic, waiting on med refill   Reason for Triage: Pt called following up on medication refill request for ondansetron , she states that she is having problems with her stomach and urgently needs refills.  Best contact: [phone]         This RN attempted to contact patient. No answer. Will forward to office for further follow up pertaining to refill rquest.

## 2024-10-24 NOTE — Telephone Encounter (Signed)
 Did send the nausea medicine.  But if this continues she is going to need to see GI please make sure that she stops any anti-inflammatories including the Voltaren  on her med list and any other similar products like Aleve  or ibuprofen  or naproxen .

## 2024-10-24 NOTE — Telephone Encounter (Signed)
 See MyChart message

## 2024-10-26 ENCOUNTER — Encounter: Payer: Self-pay | Admitting: Family Medicine

## 2024-10-26 ENCOUNTER — Telehealth: Payer: Self-pay

## 2024-10-26 NOTE — Telephone Encounter (Signed)
 Copied from CRM [id number]. Topic: Clinical - Home Health Verbal Orders >> Oct 26, 2024  2:45 PM Montie POUR wrote: Caller/Agency: [facility] [place] Number: [phone] - She has a secured voicemail Service Requested: Physical Therapy Frequency: 1 time weekly for 4 weeks Any new concerns about the patient? No

## 2024-10-26 NOTE — Telephone Encounter (Signed)
 Copied from CRM [id number]. Topic: Clinical - Medical Advice >> Oct 26, 2024  9:29 AM Larissa RAMAN wrote: Reason for CRM: Patient requesting a callback from nurse to discuss best water to use for her sinus rinse and humidifier.

## 2024-10-26 NOTE — Telephone Encounter (Signed)
 I do not think this is the change as she is already in her 55s.  That would have happened a decade ago.  It really could be medication side effect she is on a lot of medicines that can actually interact with each other.  As far as the humidifier is concerned I would recommend filtered water if at all possible so [place] is fine.

## 2024-10-26 NOTE — Telephone Encounter (Signed)
 This is another duplicate message for same concerns.  Addressed in separate message.

## 2024-10-26 NOTE — Telephone Encounter (Signed)
 Duplicate message - addressed in separate message.

## 2024-10-26 NOTE — Telephone Encounter (Signed)
 Ok for services for below

## 2024-10-26 NOTE — Telephone Encounter (Signed)
 Copied from CRM [id number]. Topic: Clinical - Medication Question >> Oct 26, 2024  4:07 PM Krystal Burgess wrote: Reason for CRM: Pt thinks she's going through menopause and would like something prescribed. She mentioned a patch that helps with symptoms. Pls call pt.

## 2024-10-26 NOTE — Telephone Encounter (Signed)
Patient sent message through MyChart

## 2024-10-27 NOTE — Telephone Encounter (Signed)
 Verbal orders given for PT to Riza with [facility] home health

## 2024-11-01 ENCOUNTER — Telehealth: Payer: Self-pay | Admitting: Family Medicine

## 2024-11-01 ENCOUNTER — Telehealth (INDEPENDENT_AMBULATORY_CARE_PROVIDER_SITE_OTHER): Admitting: Medical-Surgical

## 2024-11-01 ENCOUNTER — Ambulatory Visit: Payer: Self-pay | Admitting: *Deleted

## 2024-11-01 DIAGNOSIS — J0100 Acute maxillary sinusitis, unspecified: Secondary | ICD-10-CM | POA: Diagnosis not present

## 2024-11-01 MED ORDER — METHYLPREDNISOLONE 4 MG PO TBPK: ORAL_TABLET | ORAL | 0 refills | Status: DC

## 2024-11-01 MED ORDER — CEFDINIR 300 MG PO CAPS: 300.0000 mg | ORAL_CAPSULE | Freq: Two times a day (BID) | ORAL | 0 refills | Status: DC

## 2024-11-01 NOTE — Telephone Encounter (Signed)
 Copied from CRM [id number]. Topic: General - Other >> Nov 01, 2024  9:31 AM Zebedee SAUNDERS wrote: Reason for CRM: Received call from [facility] per [place] ph: [phone] stated pt has congestion from sinus jinfection and called for medication from Dr. Alvan sent to pharmacy. Randine will hold off on administering pneumonia shot.

## 2024-11-01 NOTE — Telephone Encounter (Signed)
  FYI Only or Action Required?: Action required by provider: medication request.  Patient was last seen in primary care on 10/04/2024 by Lowella Folks L, PA.  Called Nurse Triage reporting Sinusitis.  Symptoms began several days ago.  Interventions attempted: Ice/heat application.  Symptoms are: gradually worsening.  Triage Disposition: See HCP Within 4 Hours (Or PCP Triage)  Patient/caregiver understands and will follow disposition?: No, wishes to speak with PCP Patient states [facility] nurse visited today and advised her she needs treatment for sinus infection. Patient states she is shut in and can not come to the office- needs [facility] [address] Patient states she has someone coming who can pick her medication up- if called in within the next 2 hours.  Copied from CRM [id number]. Topic: Clinical - Red Word Triage >> Nov 01, 2024 10:25 AM Aleatha BROCKS wrote: Red Word that prompted transfer to Nurse Triage: face swollen due to sinus infection Reason for Disposition  [1] Redness or swelling on the cheek, forehead or around the eye AND [2] no fever  Answer Assessment - Initial Assessment Questions 1. LOCATION: Where does it hurt?      In the face 2. ONSET: When did the sinus pain start?  (e.g., hours, days)      Few days 3. SEVERITY: How bad is the pain?   (Scale 0-10; or none, mild, moderate or severe)     Face is swollen- left nasal sinus and under the eye 4. RECURRENT SYMPTOM: Have you ever had sinus problems before? If Yes, ask: When was the last time? and What happened that time?      Yes- keeps sinus problems- 1 month ago was last infection 5. NASAL CONGESTION: Is the nose blocked? If Yes, ask: Can you open it or must you breathe through your mouth?     yes 6. NASAL DISCHARGE: Do you have discharge from your nose? If so ask, What color?     green 7. FEVER: Do you have a fever? If Yes, ask: What is it, how was it measured, and when did it  start?      no 8. OTHER SYMPTOMS: Do you have any other symptoms? (e.g., sore throat, cough, earache, difficulty breathing)     no  Protocols used: Sinus Pain or Congestion-A-AH

## 2024-11-01 NOTE — Telephone Encounter (Signed)
 Pt reports that her face is swollen. The Home Health nurse was going to administer her PNE shot today but due to her experiencing sinus sxs decided to postpone. No fever.   She stated her sxs started a few days ago.   Pt put on schedule for today with Zada PARAS NP to be seen for Video Visit.

## 2024-11-01 NOTE — Progress Notes (Unsigned)
 Virtual Visit via Video Note  I connected with Krystal Burgess on 11/02/24 at  2:40 PM EST by a video enabled telemedicine application and verified that I am speaking with the correct person using two identifiers.   I discussed the limitations of evaluation and management by telemedicine and the availability of in person appointments. The patient expressed understanding and agreed to proceed.  Patient location: home Provider locations: office  Visit scheduled as virtual as patient is homebound and does not have transportation available.  Subjective:    CC: Sinusitis  HPI: Pleasant 63 year old female presenting via MyChart video visit with complaints of 3 days of sinusitis symptoms including left maxillary facial pain and swelling along the left side of her nose extending down into the cheek.  She has significant congestion on that side.  History of recurrent sinus infections.  Afebrile.  Denies other symptoms.   Past medical history, Surgical history, Family history not pertinant except as noted below, Social history, Allergies, and medications have been entered into the medical record, reviewed, and corrections made.   Review of Systems: See HPI for pertinent positives and negatives.   Objective:    General: Speaking clearly in complete sentences without any shortness of breath.  Alert and oriented x3.  Normal judgment. No apparent acute distress.  Impression and Recommendations:    1. Acute non-recurrent maxillary sinusitis (Primary) Symptoms consistent with acute maxillary sinusitis.  Treating with Omnicef  300 mg twice daily x 7 days and Medrol  Dosepak.  I discussed the assessment and treatment plan with the patient. The patient was provided an opportunity to ask questions and all were answered. The patient agreed with the plan and demonstrated an understanding of the instructions.   The patient was advised to call back or seek an in-person evaluation if the symptoms worsen or  if the condition fails to improve as anticipated.  Return if symptoms worsen or fail to improve.  Zada FREDRIK Palin, DNP, APRN, FNP-BC [facility]

## 2024-11-02 DIAGNOSIS — I120 Hypertensive chronic kidney disease with stage 5 chronic kidney disease or end stage renal disease: Secondary | ICD-10-CM | POA: Diagnosis not present

## 2024-11-02 DIAGNOSIS — J449 Chronic obstructive pulmonary disease, unspecified: Secondary | ICD-10-CM | POA: Diagnosis not present

## 2024-11-02 DIAGNOSIS — N185 Chronic kidney disease, stage 5: Secondary | ICD-10-CM | POA: Diagnosis not present

## 2024-11-02 NOTE — Progress Notes (Signed)
 Krystal Burgess                                          MRN: [id number]   11/02/2024   The VBCI Quality Team Specialist reviewed this patient medical record for the purposes of chart review for care gap closure. The following were reviewed: abstraction for care gap closure-glycemic status assessment.    VBCI Quality Team

## 2024-11-03 ENCOUNTER — Ambulatory Visit: Payer: Self-pay | Admitting: Family Medicine

## 2024-11-03 DIAGNOSIS — R000 Tachycardia, unspecified: Secondary | ICD-10-CM | POA: Diagnosis not present

## 2024-11-03 NOTE — Progress Notes (Signed)
 Great news!  Heart monitor showed no A-fib.  They did see a couple of PACs and PVCs which are early beats they can feel like your heart is skipping or jumping, but they are not harmful which is great news.

## 2024-11-07 ENCOUNTER — Telehealth: Payer: Self-pay

## 2024-11-07 NOTE — Transitions of Care (Post Inpatient/ED Visit) (Signed)
 11/07/2024  Name: Krystal Burgess MRN: [id number] DOB: 09-Jan-1961  Today's TOC FU Call Status: Today's TOC FU Call Status:: Successful TOC FU Call Completed TOC FU Call Complete Date: 11/07/24  Patient's Name and Date of Birth confirmed. Name, DOB  Transition Care Management Follow-up Telephone Call Date of Discharge: 11/06/24 Discharge Facility: Other [facility]) Name of Other (Non[facility]) Discharge Facility: [facility] Type of Discharge: Inpatient Admission Primary Inpatient Discharge Diagnosis:: [facility] How have you been since you were released from the hospital?: Better Any questions or concerns?: No  Items Reviewed: Did you receive and understand the discharge instructions provided?: Yes Medications obtained,verified, and reconciled?: Yes (Medications Reviewed) Any new allergies since your discharge?: No Dietary orders reviewed?: Yes Do you have support at home?: No  Medications Reviewed Today: Medications Reviewed Today     Reviewed by Emmitt Pan, LPN (Licensed Practical Nurse) on 11/07/24 at 1429  Med List Status: <None>   Medication Order Taking? Sig Documenting Provider Last Dose Status Informant  albuterol  (VENTOLIN  HFA) 108 (90 Base) MCG/ACT inhaler [id number] Yes Inhale 2 puffs into the lungs every 6 (six) hours as needed for wheezing or shortness of breath. Alvia Bring, DO  Active   ALPRAZolam  (XANAX ) 1 MG tablet [id number] Yes Take 1 tablet (1 mg total) by mouth 2 (two) times daily as needed for anxiety. Alvan Dorothyann BIRCH, MD  Active   AMBULATORY JESSE SCHLOSSMAN MEDICATION [id number] Yes Battery powered portable oxygen  concentrator Alvan Dorothyann BIRCH, MD  Active   SONJIA JESSE SCHLOSSMAN MEDICATION [id number] Yes Medication Name: Needs labs drawn: CBC w/ diff, CMP, TSH, A1C, B12, B1, B6, Magensium, folate, Iron panel, Lipid panel, urine microalbumin Alvan Dorothyann BIRCH, MD  Active   Azelastine  HCl 137 MCG/SPRAY SOLN [id number] Yes Place into both  nostrils. [provider]  Active   Blood Glucose Monitoring Suppl DEVI [id number] Yes 1 each by Does not apply route daily as needed. Include strips and lancets to test once daily.  Dx. Diabetes. May substitute to any manufacturer covered by patient's insurance. Alvan Dorothyann BIRCH, MD  Active   buprenorphine [facility]) 20 MCG/HR PTWK [id number] Yes Place 1 patch onto the skin once a week. [provider]  Active   busPIRone  (BUSPAR ) 5 MG tablet [id number] Yes Take 1 tablet (5 mg total) by mouth 2 (two) times daily. Rankin, Shuvon B, NP  Active   cefdinir  (OMNICEF ) 300 MG capsule [id number] Yes Take 1 capsule (300 mg total) by mouth 2 (two) times daily. Willo Mini, NP  Active   fluticasone  (FLONASE ) 50 MCG/ACT nasal spray [id number] Yes SPRAY 2 SPRAYS INTO EACH NOSTRIL EVERY DAY Breeback, Jade L, PA-C  Active   furosemide  (LASIX ) 20 MG tablet [id number]  Take 20 mg by mouth every other day.  Patient not taking: Reported on 11/07/2024   [provider]  Active   gabapentin  (NEURONTIN ) 600 MG tablet [id number] Yes Take 600 mg by mouth 3 (three) times daily. [provider]  Active   hydrOXYzine (ATARAX) 10 MG tablet [id number] Yes Take 10 mg to 20 mg (1-2 tablets) three times daily as needed Rankin, Shuvon B, NP  Active   ipratropium (ATROVENT ) 0.06 % nasal spray [id number] Yes Place 2 sprays into both nostrils 3 (three) times daily. Alvan Dorothyann BIRCH, MD  Active   ipratropium-albuterol  (DUONEB) 0.5-2.5 (3) MG/3ML SOLN [id number] Yes Take 3 mLs by nebulization every 6 (six) hours as needed. Alvan Dorothyann BIRCH, MD  Active   levocetirizine (XYZAL ) 5 MG tablet [id number] Yes Take  1 tablet (5 mg total) by mouth every evening. Alvan Dorothyann BIRCH, MD  Active   levothyroxine  (SYNTHROID ) 88 MCG tablet [id number] Yes Take 1 tablet (88 mcg total) by mouth daily. Alvan Dorothyann BIRCH, MD  Active   losartan  (COZAAR ) 100 MG tablet [id number] Yes Take 1 tablet (100 mg total) by  mouth daily. Alvan Dorothyann BIRCH, MD  Active   methylPREDNISolone  (MEDROL  DOSEPAK) 4 MG TBPK tablet [id number] Yes Take as directed. Willo Mini, NP  Active   ondansetron  (ZOFRAN -ODT) 4 MG disintegrating tablet [id number] Yes Take 1 tablet (4 mg total) by mouth every 8 (eight) hours as needed for nausea or vomiting. Alvan Dorothyann BIRCH, MD  Active   pantoprazole  (PROTONIX ) 40 MG tablet [id number] Yes Take 1 tablet (40 mg total) by mouth daily. Alvan Dorothyann BIRCH, MD  Active   promethazine  (PHENERGAN ) 25 MG tablet [id number] Yes Take 1 tablet (25 mg total) by mouth every 6 (six) hours as needed for nausea or vomiting. Alvan Dorothyann BIRCH, MD  Active   rosuvastatin  (CRESTOR ) 20 MG tablet [id number] Yes Take 1 tablet (20 mg total) by mouth at bedtime. Alvan Dorothyann BIRCH, MD  Active   Sodium Chloride -Sodium Bicarb 1.57 g PACK [id number] Yes Nasal saline irrigation nightly. Alvan Dorothyann BIRCH, MD  Active   theophylline  (THEODUR) 300 MG 12 hr tablet [id number] Yes Take 300 mg by mouth 2 (two) times daily. [provider]  Active   tiZANidine  (ZANAFLEX ) 2 MG tablet [id number] Yes Take 1 tablet (2 mg total) by mouth 2 (two) times daily as needed for muscle spasms. Use sparingly. Alvan Dorothyann BIRCH, MD  Active   traZODone  (DESYREL ) 50 MG tablet [id number] Yes Take 1 tablet (50 mg total) by mouth at bedtime as needed for sleep. Rankin, Shuvon B, NP  Active   TRELEGY ELLIPTA  100-62.5-25 MCG/ACT AEPB [id number] Yes INHALE 1 PUFF BY MOUTH EVERY DAY Alvan Dorothyann BIRCH, MD  Active   Vilazodone  HCl (VIIBRYD ) 40 MG TABS [id number] Yes Take 1 tablet (40 mg total) by mouth daily. Rankin, Shuvon B, NP  Active             Home Care and Equipment/Supplies: Were Home Health Services Ordered?: NA Any new equipment or medical supplies ordered?: NA  Functional Questionnaire: Do you need assistance with bathing/showering or dressing?: No Do you need assistance with meal preparation?: No Do you need  assistance with eating?: No Do you have difficulty maintaining continence: No Do you need assistance with getting out of bed/getting out of a chair/moving?: No Do you have difficulty managing or taking your medications?: No  Follow up appointments reviewed: PCP Follow-up appointment confirmed?: Yes Date of PCP follow-up appointment?: 11/14/24 Follow-up Provider: [facility] Follow-up appointment confirmed?: NA Do you need transportation to your follow-up appointment?: No Do you understand care options if your condition(s) worsen?: Yes-patient verbalized understanding    SIGNATURE Julian Lemmings, LPN [facility] Nurse Health Advisor Direct Dial [phone]

## 2024-11-08 ENCOUNTER — Telehealth (HOSPITAL_COMMUNITY): Payer: Self-pay | Admitting: *Deleted

## 2024-11-08 NOTE — Progress Notes (Signed)
 CARE COORDINATOR CONTACT WITH PATIENT/CAREGIVER AFTER HOSPITAL DISCHARGE   Patient contacted.  Encouraged to schedule Hospital follow up appointment with their Non NH PCP.  Patient is doing ok. She verified that her pcp is Krystal Burgess and her follow-up is on 11/14/2024 at 2:40 pm.  She is still having bits of nausea as she has a hiatal hernia. I encouraged her to call her pcp for questions or concerns.

## 2024-11-08 NOTE — Telephone Encounter (Signed)
 [facility] [place] requesting [facility] for patient [name] . They are needing 90 days worth of medication which will come to a quantity of 540.0.  Medication was last filled 10/12/2024.   Patient f/u appt is 11/09/2024

## 2024-11-09 ENCOUNTER — Telehealth (HOSPITAL_COMMUNITY): Admitting: Registered Nurse

## 2024-11-09 ENCOUNTER — Ambulatory Visit: Payer: Self-pay

## 2024-11-09 ENCOUNTER — Encounter (HOSPITAL_COMMUNITY): Payer: Self-pay | Admitting: Registered Nurse

## 2024-11-09 DIAGNOSIS — F330 Major depressive disorder, recurrent, mild: Secondary | ICD-10-CM | POA: Diagnosis not present

## 2024-11-09 DIAGNOSIS — F411 Generalized anxiety disorder: Secondary | ICD-10-CM

## 2024-11-09 MED ORDER — BUSPIRONE HCL 5 MG PO TABS: 2.5000 mg | ORAL_TABLET | Freq: Two times a day (BID) | ORAL | 0 refills | Status: DC

## 2024-11-09 MED ORDER — HYDROXYZINE HCL 10 MG PO TABS: ORAL_TABLET | ORAL | 0 refills | Status: DC

## 2024-11-09 MED ORDER — VILAZODONE HCL 40 MG PO TABS: 40.0000 mg | ORAL_TABLET | Freq: Every day | ORAL | 0 refills | Status: DC

## 2024-11-09 NOTE — Progress Notes (Signed)
 [facility] MD/PA/NP OP Progress Note  11/09/2024 3:20 PM [name]  MRN:  [id number]  Virtual Visit via Video Note  I connected with Krystal Burgess on 11/09/24 at  2:00 PM EST by a video enabled telemedicine application and verified that I am speaking with the correct person using two identifiers.  Location: Patient: Home Provider: Home office   I discussed the limitations of evaluation and management by telemedicine and the availability of in person appointments. The patient expressed understanding and agreed to proceed.  I discussed the assessment and treatment plan with the patient. The patient was provided an opportunity to ask questions and all were answered. The patient agreed with the plan and demonstrated an understanding of the instructions.   The patient was advised to call back or seek an in-person evaluation if the symptoms worsen or if the condition fails to improve as anticipated.  I provided 40 minutes of non-face-to-face time during this encounter.   [name], NP   Chief Complaint:  Chief Complaint  Patient presents with   Follow-up    Medication management   HPI: Krystal Burgess 63 y.o. female presents today for medication management follow up.  She was seen via virtual video visit by this provide and chart reviewed on 11/09/24.  Her psychiatric history is significant for major depression, general anxiety, panic attack, insomnia.  Her mental health is currently managed with Viibryd  40 mg daily, BuSpar  5 mg twice daily, trazodone  50 mg daily at bedtime as needed, Vistaril 10 mg-20 mg 3 times a day as needed.  She reports her current medication regimen is not effectively managing her mental health.  She reports the only medication that is working for her is the Viibryd .  She reports she never started Vistaril and has continued to take her Xanax  because she is afraid that the Vistaril would make her too sleepy and I don't want nothing is going to make me sleep all  day.  She was informed that the Vistaril should not make her any more sleepy than the trazodone  and that she was given a 10 mg tablet where she could take 1 to 2 tablets and was advised to take 1 tablet first to see how she reacted.  Understanding voiced.  She reports she stopped taking the trazodone  because I stopped taking it because I think it was part of the reason for this panic crap.  Every time I would lay down after taking it I will start jerking and I will have to get up out of bed.  Informed with discontinue trazodone .  She reports she is no longer taking the BuSpar  related to it making her sick on the stomach and she asked if it could be taking at a lower dose.  Informed with a scored tablet it could be broken in half and she could take 2.5 mg twice daily.  She reports that the tablets are scored and she voices understanding about taking a half a tablet twice daily.  She reports she has been to the emergency room 3 times related to panic attack and heart palpitations and decreased potassium.  She states I am a total wreck and I need to be directly admitted to the hospital.  She reports that she wants to be admitted to a hospital But I don't want to be admitted to the nut house.  I want to be admitted so I can get my medications straighten out.  I just got off the phone with my primary  doctor asking her to admit me to hospital or to rehab for physical therapy because I'm not getting enough therapy from home.  She asked if I could send a note to her PCP informing them on how she could get her psychiatric medications looked at while she was in the hospital.  She was informed that this provider will send a secure message to her PCP also informed her that when she was admitted to hospital she could always ask for a psychiatric consult.  Discussed [employer] testing to see which psychotropic medications will work better for her.  She agrees to [employer] test but states she is unable to go to the office  to have test done related to living on the second floor of apartments and unable to go up and down stairs.  She reports she has a nurse aide that comes out to see her weekly and wanted to know if the test could be done by the nurse A.  She was informed if nurse aide had testing their office they could do the test or she could send the aide by our office to pick up supplies but it would have to be turned back in the same day to be sent off.  Understanding voiced.  She denies suicidal/self-harm/homicidal ideation, psychosis, paranoia, and abnormal movement.  Recommendations: Continue Viibryd  40 mg daily, change BuSpar  2.5 mg twice daily, continue Vistaril 10 mg-20 mg 3 times daily as needed, discontinue trazodone  50 mg daily at bedtime as needed. She voiced understanding and agreement with today's plan and recommendations.  Visit Diagnosis:    ICD-10-CM   1. GAD (generalized anxiety disorder)  F41.1 busPIRone  (BUSPAR ) 5 MG tablet    2. Major depressive disorder, recurrent, mild  F33.0 busPIRone  (BUSPAR ) 5 MG tablet    hydrOXYzine (ATARAX) 10 MG tablet    Vilazodone  HCl (VIIBRYD ) 40 MG TABS      Past Psychiatric History:  Diagnosis: Major depression, general anxiety, insomnia, panic attacks Suicide attempt: Denies Non-suicidal self-injurious behavior: Denies Psychiatric hospitalization: Reports she has had 2 psychiatric hospitalizations when she was younger 1 for depression and 1 where she was hospitalized by her mother but did not need to be there Past trauma: Denies Substance abuse: Denies Past psychotropic medication trials: Reports she has been on so many psychotropic medications that she cannot remember the name of all of them.  Other than the current medication she is taking the only name she could remember was Zoloft .  She reports the Viibryd  has worked better than any medication she is taking her past and continues to work for her.  Reports she has been taking it for 1 year now.  Past  Medical History:  Past Medical History:  Diagnosis Date   Allergy    COPD (chronic obstructive pulmonary disease) (HCC)    Glaucoma    Hyperlipidemia    Hypertension    MRSA (methicillin resistant staph aureus) culture positive    Pain    Renal disorder    Thyroid  disease     Past Surgical History:  Procedure Laterality Date   CESAREAN SECTION     CHOLECYSTECTOMY     lipoma removal     RT shoulder   LUMBAR SPINE SURGERY  12/23/2011   Dr. Delores, L4-5   pilonydal cyst  12/22/1992   TOTAL ABDOMINAL HYSTERECTOMY  1999   for abnormal cervical cells and fibroids    Family Psychiatric History: See below and family history  Family History:  Family History  Problem Relation  Age of Onset   Diabetes Brother    Depression Brother        bipolar   Cancer Brother        throat   Diabetes Other        grandmother   Depression Mother    Hyperlipidemia Mother    Hypertension Mother    Pancreatic cancer Mother    Hypertension Father     Social History:  Social History   Socioeconomic History   Marital status: Divorced    Spouse name: Not on file   Number of children: 1   Years of education: 1   Highest education level: 12th grade  Occupational History   Occupation: Disabled  Tobacco Use   Smoking status: Former    Current packs/day: 0.00    Average packs/day: 1 pack/day for 38.6 years (38.6 ttl pk-yrs)    Types: Cigarettes    Start date: 07/30/1981    Quit date: 03/19/2020    Years since quitting: 4.6   Smokeless tobacco: Never  Vaping Use   Vaping status: Some Days   Substances: Nicotine   Substance and Sexual Activity   Alcohol use: Not Currently   Drug use: No   Sexual activity: Not Currently  Other Topics Concern   Not on file  Social History Narrative   Lives alone. She enjoys watching TV.   Social Drivers of Health   Financial Resource Strain: Patient Declined (11/01/2024)   Overall Financial Resource Strain (CARDIA)    Difficulty of Paying Living  Expenses: Patient declined  Food Insecurity: Patient Declined (11/01/2024)   Hunger Vital Sign    Worried About Running Out of Food in the Last Year: Patient declined    Ran Out of Food in the Last Year: Patient declined  Transportation Needs: Unmet Transportation Needs (11/01/2024)   PRAPARE - Administrator, Civil Service (Medical): Yes    Lack of Transportation (Non-Medical): Yes  Physical Activity: Inactive (09/01/2024)   Exercise Vital Sign    Days of Exercise per Week: 0 days    Minutes of Exercise per Session: 0 min  Stress: Stress Concern Present (09/01/2024)   [employer] of Occupational Health - Occupational Stress Questionnaire    Feeling of Stress: Rather much  Social Connections: Unknown (11/01/2024)   Social Connection and Isolation Panel    Frequency of Communication with Friends and Family: Twice a week    Frequency of Social Gatherings with Friends and Family: Once a week    Attends Religious Services: Patient declined    Database Administrator or Organizations: Patient declined    Attends Banker Meetings: Not on file    Marital Status: Patient declined  Recent Concern: Social Connections - Socially Isolated (09/01/2024)   Social Connection and Isolation Panel    Frequency of Communication with Friends and Family: More than three times a week    Frequency of Social Gatherings with Friends and Family: Once a week    Attends Religious Services: Never    Database Administrator or Organizations: No    Attends Banker Meetings: Never    Marital Status: Divorced    Allergies:  Allergies  Allergen Reactions   Risperidone  And Paliperidone Shortness Of Breath   Aripiprazole Other (See Comments)    REACTION: muscle jerks    Fluoxetine Hcl Other (See Comments)    REACTION: Intolerant   Nsaids Other (See Comments)    Upper GI bleed CKD 3  Paroxetine Other (See Comments)    REACTION: Intolerance    Ziprasidone  Hcl Other  (See Comments)    shakes    Chantix  [Varenicline  Tartrate] Other (See Comments)    messing with my mood   Metformin And Related Nausea Only   Montelukast  Other (See Comments)    it kept me sick with an upper respiratory infection   Onglyza [Saxagliptin ] Nausea Only   Augmentin  [Amoxicillin -Pot Clavulanate] Other (See Comments)    Gets yeast infection every time    Fluoxetine Hcl     REACTION: Intolerant    Metabolic Disorder Labs: Labs reviewed Lab Results  Component Value Date   HGBA1C 5.5 01/16/2023   MPG 111.15 01/16/2023   MPG 128.37 03/02/2021   No results found for: PROLACTIN Lab Results  Component Value Date   CHOL 109 11/28/2021   TRIG 175 (H) 11/28/2021   HDL 46 (L) 11/28/2021   CHOLHDL 2.4 11/28/2021   VLDL 45 (H) 11/01/2015   LDLCALC 38 11/28/2021   LDLCALC 47 09/20/2020   Lab Results  Component Value Date   TSH 4.64 09/02/2024   TSH 7.334 (H) 01/18/2023    Current Medications: Current Outpatient Medications  Medication Sig Dispense Refill   albuterol  (VENTOLIN  HFA) 108 (90 Base) MCG/ACT inhaler Inhale 2 puffs into the lungs every 6 (six) hours as needed for wheezing or shortness of breath. 8 g 0   ALPRAZolam  (XANAX ) 1 MG tablet Take 1 tablet (1 mg total) by mouth 2 (two) times daily as needed for anxiety. 60 tablet 0   AMBULATORY NON FORMULARY MEDICATION Battery powered portable oxygen  concentrator 1 each 0   AMBULATORY NON FORMULARY MEDICATION Medication Name: Needs labs drawn: CBC w/ diff, CMP, TSH, A1C, B12, B1, B6, Magensium, folate, Iron panel, Lipid panel, urine microalbumin 1 Units 1   Azelastine  HCl 137 MCG/SPRAY SOLN Place into both nostrils.     Blood Glucose Monitoring Suppl DEVI 1 each by Does not apply route daily as needed. Include strips and lancets to test once daily.  Dx. Diabetes. May substitute to any manufacturer covered by patient's insurance. 1 each 0   buprenorphine (BUTRANS) 20 MCG/HR PTWK Place 1 patch onto the skin once a  week.     busPIRone  (BUSPAR ) 5 MG tablet Take 0.5 tablets (2.5 mg total) by mouth 2 (two) times daily. 60 tablet 0   cefdinir  (OMNICEF ) 300 MG capsule Take 1 capsule (300 mg total) by mouth 2 (two) times daily. 14 capsule 0   fluticasone  (FLONASE ) 50 MCG/ACT nasal spray SPRAY 2 SPRAYS INTO EACH NOSTRIL EVERY DAY 48 mL 1   furosemide  (LASIX ) 20 MG tablet Take 20 mg by mouth every other day. (Patient not taking: Reported on 11/07/2024)     gabapentin  (NEURONTIN ) 600 MG tablet Take 600 mg by mouth 3 (three) times daily.     hydrOXYzine (ATARAX) 10 MG tablet Take 10 mg to 20 mg (1-2 tablets) three times daily as needed 180 tablet 0   ipratropium (ATROVENT ) 0.06 % nasal spray Place 2 sprays into both nostrils 3 (three) times daily. 45 mL 11   ipratropium-albuterol  (DUONEB) 0.5-2.5 (3) MG/3ML SOLN Take 3 mLs by nebulization every 6 (six) hours as needed. 360 mL 1   levocetirizine (XYZAL ) 5 MG tablet Take 1 tablet (5 mg total) by mouth every evening. 90 tablet 1   levothyroxine  (SYNTHROID ) 88 MCG tablet Take 1 tablet (88 mcg total) by mouth daily. 30 tablet 0   losartan  (COZAAR ) 100 MG tablet Take 1  tablet (100 mg total) by mouth daily. 90 tablet 1   methylPREDNISolone  (MEDROL  DOSEPAK) 4 MG TBPK tablet Take as directed. 21 tablet 0   ondansetron  (ZOFRAN -ODT) 4 MG disintegrating tablet Take 1 tablet (4 mg total) by mouth every 8 (eight) hours as needed for nausea or vomiting. 20 tablet 0   pantoprazole  (PROTONIX ) 40 MG tablet Take 1 tablet (40 mg total) by mouth daily. 90 tablet 3   promethazine  (PHENERGAN ) 25 MG tablet Take 1 tablet (25 mg total) by mouth every 6 (six) hours as needed for nausea or vomiting. 30 tablet 0   rosuvastatin  (CRESTOR ) 20 MG tablet Take 1 tablet (20 mg total) by mouth at bedtime. 90 tablet 3   Sodium Chloride -Sodium Bicarb 1.57 g PACK Nasal saline irrigation nightly. 100 each PRN   theophylline  (THEODUR) 300 MG 12 hr tablet Take 300 mg by mouth 2 (two) times daily.      tiZANidine  (ZANAFLEX ) 2 MG tablet Take 1 tablet (2 mg total) by mouth 2 (two) times daily as needed for muscle spasms. Use sparingly. 45 tablet 0   TRELEGY ELLIPTA  100-62.5-25 MCG/ACT AEPB INHALE 1 PUFF BY MOUTH EVERY DAY 180 each 1   Vilazodone  HCl (VIIBRYD ) 40 MG TABS Take 1 tablet (40 mg total) by mouth daily. 60 tablet 0   No current facility-administered medications for this visit.     Musculoskeletal: Strength & Muscle Tone: Unable to assess via virtual visit Gait & Station: Unable to assess via virtual visit Patient leans: N/A  Psychiatric Specialty Exam: Review of Systems  Constitutional:        No other complaints voiced at this time  Psychiatric/Behavioral:  Positive for agitation, dysphoric mood and sleep disturbance. Negative for hallucinations, self-injury and suicidal ideas. The patient is nervous/anxious.   All other systems reviewed and are negative.   There were no vitals taken for this visit.There is no height or weight on file to calculate BMI.  General Appearance: Casual  Eye Contact:  Good  Speech:  Clear and Coherent and Normal Rate  Volume:  Normal  Mood:  Anxious, Depressed, and Irritable  Affect:  Congruent  Thought Process:  Coherent, Goal Directed, and Descriptions of Associations: Intact  Orientation:  Full (Time, Place, and Person)  Thought Content: Logical   Suicidal Thoughts:  No  Homicidal Thoughts:  No  Memory:  Immediate;   Good Recent;   Good Remote;   Good  Judgement:  Intact  Insight:  Present  Psychomotor Activity:  Normal  Concentration:  Concentration: Good and Attention Span: Good  Recall:  Good  Fund of Knowledge: Good  Language: Good  Akathisia:  No  Handed:  Right  AIMS (if indicated): not done  Assets:  Communication Skills Desire for Improvement Financial Resources/Insurance Housing Resilience Social Support  ADL's:  Intact  Cognition: WNL  Sleep:  Fair   Screenings: Geneticist, Molecular Office Visit from  10/12/2024 in [facility] at [facility]  AIMS Total Score 0   GAD-7    Flowsheet Row Office Visit from 10/12/2024 in [facility] at [facility] Video Visit from 12/22/2023 in [facility] at [facility] Video Visit from 09/08/2023 in [facility] Primary Care & [facility] Medicine at [facility] Video Visit from 07/01/2023 in [facility] Care & [facility] Medicine at [facility] Office Visit from 12/16/2022 in [facility] Medicine at [facility]  Total  GAD-7 Score 16 19 14 16 14    PHQ2-9    Flowsheet Row Office Visit from 10/12/2024 in [facility] at [facility] Clinical Support from 09/01/2024 in [facility] Primary Care & Sports Medicine at [facility] Video Visit from 12/22/2023 in [facility] Care & [facility] Medicine at [facility] Video Visit from 09/08/2023 in [facility] Care & [facility] Medicine at [facility] Office Visit from 08/25/2023 in [facility] Primary Care & Sports Medicine at [facility]  PHQ-2 Total Score 4 6 6 5 6   PHQ-9 Total Score 7 21 26 20 24    Flowsheet Row Office Visit from 10/12/2024 in [facility] at [facility] ED to Hosp-Admission (Discharged) from 12/29/2023 in [facility] Office Visit from 08/25/2023 in [facility] Primary Care & Sports Medicine at [facility]  C-SSRS RISK CATEGORY No Risk No Risk No Risk   Assessment and Plan:  Assessment: Summary of today's assessment: TABITA CORBO reported only medication that is working for her is the Viibryd .  Reported complaints about all other psychotropic medication she is prescribed.  Medication assessment completed and adjustments made.  Reporting she is wanting to be admitted to a hospital  either medical or for PT rehab and while in the hospital she wants to have her medications assessed and straightened out.  Reports she does not want to be admitted to psychiatric hospital.  Informed that she does not meet criteria for psychiatric hospitalization.  Wanted this provider to contact her PCP to inform how she can get medication assessment or psychiatric assessment while she was in the hospital.  Informed will provide a message to her PCP.  Reports there has been no improvement in her anxiety and continues to have panic attacks having to go to the emergency room at least 3 times.  She denies suicidal/self-harm/homicidal ideation, psychosis, paranoia, and abnormal movements. During visit she was dressed appropriate for age and weather.  She was seated comfortably in view of camera with no noted distress.  She was alert/oriented x 4, calm/cooperative, but irritated/frustrated.  Her mood was congruent with affect.  She spoke in a clear tone at moderate volume, and normal pace, with good eye contact.  Her thought process was coherent, relevant, and there was no indication that she was responding to internal/external stimuli or experiencing delusional thought content.  1. GAD (generalized anxiety disorder) - busPIRone  (BUSPAR ) 5 MG tablet; Take 0.5 tablets (2.5 mg total) by mouth 2 (two) times daily.  Dispense: 60 tablet; Refill: 0  2. Major depressive disorder, recurrent, mild - busPIRone  (BUSPAR ) 5 MG tablet; Take 0.5 tablets (2.5 mg total) by mouth 2 (two) times daily.  Dispense: 60 tablet; Refill: 0 - hydrOXYzine (ATARAX) 10 MG tablet; Take 10 mg to 20 mg (1-2 tablets) three times daily as needed  Dispense: 180 tablet; Refill: 0 - Vilazodone  HCl (VIIBRYD ) 40 MG TABS; Take 1 tablet (40 mg total) by mouth daily.  Dispense: 60 tablet; Refill: 0       Plan: Medication management: Meds ordered this encounter  Medications   busPIRone  (BUSPAR ) 5 MG tablet    Sig: Take 0.5 tablets (2.5 mg total)  by mouth 2 (two) times daily.    Dispense:  60 tablet    Refill:  0    Supervising Provider:   ARFEEN, SYED T [[id number]]   hydrOXYzine (ATARAX) 10 MG tablet    Sig: Take 10 mg to 20 mg (1-2 tablets)  three times daily as needed    Dispense:  180 tablet    Refill:  0    Supervising Provider:   CURRY PATERSON T [[id number]]   Vilazodone  HCl (VIIBRYD ) 40 MG TABS    Sig: Take 1 tablet (40 mg total) by mouth daily.    Dispense:  60 tablet    Refill:  0    Supervising Provider:   ARFEEN, SYED T [[id number]]   Medications Discontinued During This Encounter  Medication Reason   traZODone  (DESYREL ) 50 MG tablet Patient Preference   Vilazodone  HCl (VIIBRYD ) 40 MG TABS Reorder   busPIRone  (BUSPAR ) 5 MG tablet    hydrOXYzine (ATARAX) 10 MG tablet Reorder    Labs:  Not indicated at this time.     Other:   Counseling/Therapy: Keep scheduled appointment with Adam Goldammer, LCSW for 12/08/2024 at 2 PM.    Secure message sent to her PCP Sheryl, Dorothyann BIRCH, MD) informing if patient is admitted to PT rehab or medical hospitalization she would just need to request a psychiatric consult to look at her psychiatric medications  KLEA NALL was instructed to call [phone], mobile crisis, or present to the nearest emergency room should she experiences any suicidal/homicidal ideation, auditory/visual/hallucinations, or detrimental worsening of her mental health condition.    Krystal Burgess participated in the development of this treatment plan and verbalized her understanding/agreement with plan as listed.   Follow Up: Return in 1 month for medication management Call in the interim for any side-effects, decompensation, questions, or problems  Collaboration of Care: Collaboration of Care: Medication Management AEB medication assessment, adjustment, refills, Primary Care Provider AEB secure message sent to PCP informing how a psychiatric consult could be obtained while medically admitted to hospital., Referral or  follow-up with counselor/therapist AEB follow-up on referral for counseling/therapy, and Other community resources added to AVS  Patient/Guardian was advised Release of Information must be obtained prior to any record release in order to collaborate their care with an outside provider. Patient/Guardian was advised if they have not already done so to contact the registration department to sign all necessary forms in order for us  to release information regarding their care.   Consent: Patient/Guardian gives verbal consent for treatment and assignment of benefits for services provided during this visit. Patient/Guardian expressed understanding and agreed to proceed.    Takumi Din, NP 11/09/2024, 3:20 PM

## 2024-11-09 NOTE — Patient Instructions (Addendum)
 If no one has contacted, you by the end of business day today please call the appropriate office listed below to schedule your next visit for medication management with [name], NP:    [facility] [address], [place], [place] [address]  5.4 mi Phone: [phone] (Call to schedule appointment)  [facility] [address] [place], [place] [address]  25 mi Phone: [phone] (Call to schedule appointment)   Call 911, 988, mobile crisis, or present to the nearest emergency room should you experience any suicidal/homicidal ideation, auditory/visual/hallucinations, or detrimental worsening of your mental health.  Mobile Crisis Response Teams Listed by counties in vicinity of [facility] providers [facility] [employer] [phone] [facility] [phone] [facility] [phone] [facility] [phone] [place]                * [facility] [phone]                * [employer] [phone]  [facility] [phone] [facility] [employer]  [phone] * [employer] [phone]                            Hours of Operation Mon 08:30 AM - 04:30 PM Tue 08:30 AM - 04:30 PM Wed 08:30 AM - 04:30 PM Thur 08:30 AM - 04:30 PM Fri 08:30 AM - 04:30 PM  About Us  At [facility], we offer a whole-person approach to our services to give people with disabilities the confidence they need to reach their goals. Our programs inspire people of all abilities to live a full and independent life. At [facility], we provide dynamic and inclusive programs and services to empower individuals with  disabilities to live life fully. This includes our SSI and SSDI Benefits Assistance, accessible through your county advocate. [employer] offers accessories and clothing needed to put your best foot forward when searching for a job - call to make an appointment to shop our selection or donate to the cause. Other programs our team offers include our [employer], the [employer], and events that are both adaptive and inclusive for all. Our social support groups meet regularly in-person and virtually for both our youth and adult consumers. Contact us  today to learn more about our programs serving those with disabilities.  Information and Referrals Helping you access the resources you need to achieve success. Navigating a maze of resources to find the right one for your needs can sometimes be challenging. At [facility], we understand how difficult it can be, which is why we offer information and referral services to help streamline your search. Whether you have questions about your eligibility for resources or need in-depth information about a particular topic, our connections and database can guide you to the resources you need to make informed choices. When you need support in finding the right path or taking the first step, contact our office to be connected with a Community Inclusion Specialist.  Systems and Individual Advocacy Providing advocacy that enhances the lives of people with disabilities. Systems and individual advocacy are a core component of our work at the [employer]. This vital service is part of the framework for helping to protect the rights and well-being of people with disabilities. In  turn, these services try to eliminate systemic barriers, ensure people with disabilities have access to services, and fight for their rights and respect on both an individual and a larger scale. Our team provides these much-needed services to  all individuals with a disability, regardless of age, race, sexual orientation, etc. Individual advocacy means giving people with disabilities one-on-one assistance to help them deal with certain aspects of their lives and ensure their rights are respected. This could include help getting health care, an education, a place to live, a job, or social services. Our team works closely with the person with a disability to find out what they need, what they like, and their goals to ensure they get the support they need. Systems advocacy focuses on systemic problems and pushes for policy changes to help a wider group of people with disabilities. This is done through a dedication to changing laws, rules, policies, and practices that affect the lives of people with disabilities. Our team works closely with government agencies, [facility], and other stakeholders to make it easier for people with disabilities to get around, be a part of groups, and have equal chances. This includes such things as working for better public transportation, more accessible infrastructure, education for everyone, increased job prospects, and more.   Overall, systems and individual advocacy services work together to give people with disabilities well-rounded support. If you're curious about joining our cause or learning more about how systems and individual advocacy helps people with disabilities  Independent Living Skills We promote independence through specialized training. Independent life skills training for people with disabilities is a critical and powerful way to improve their quality of life, boost their self-confidence, and more. This specialized training gives them the tools, knowledge, and skills they need to live fulfilled and happy lives, no matter what challenges they face. At [facility], we offer this service to ensure that people with disabilities can thrive while living independently and  reach any goals they've set for themselves. Key components of independent living skills are detailed below. One of the biggest areas of independent living skills training is an introduction to daily living skills. This includes a wide range of things needed for everyday life, such as personal cleanliness, dressing, making meals, and cleaning the house. When people learn and master these skills, they gain a sense of pride and accomplishment that empowers them to continue pushing through boundaries. Another critical factor of independent living skills training is focused on mobility and transportation. This training might include the skills needed to utilize public methods of transport, safety precautions necessary when accessing these services, and how to understand routes to get from point A to point B. In addition, individuals with mobility or hearing and vision issues gain specialized training to gain the access they need. Learning how to handle money, make a budget, and plan for the future is another aspect of independent living skills training that can help people with disabilities have more control over their financial well-being. This means knowing the basics of money, having financial goals, and more. These are just a few of the aspects of independent living skills training. Each client will receive a customized plan to help them with their needs. By teaching a wide range of skills, our team can give our clients the tools they need to live self-sufficient and successful lives.  Peer Counseling Let us  help you with safe and supportive services. Peer counseling is an invaluable and necessary resource for everyone, but it is especially  critical for those with disabilities. With targeted support, this type of counseling provides individuals with a way to connect with others who have been through similar things, giving them a sense of belonging and strength. At [facility], peer counseling  is an essential part of the services we offer. Continue reading to learn more about the benefits of group counseling, and then call our team to inquire about specific services. Peer counseling offers a chance to share with others and learn about healthy ideas and tips directly valuable for their lives. For people with disabilities, this is a chance to understand everyday life's battles, victories, frustrations, and successes in a comfortable and safe setting. This shared understanding makes a strong bond of empathy and sincerity. Also, having a disability can sometimes make a person feel alone, isolated, or even depressed. Peer counselors can help individuals feel better by being there to listen and acknowledge their feelings. They can offer a safe place to discuss feelings and fears without being judged, which reduces depression and provides empowerment. In addition to connecting with others, peer counseling can help strengthen self-confidence and decrease shame. And since everyone has their own unique story, the group setting provides an opportunity to learn empathy and compassion for others. Peer counseling is vital to help people with disabilities express their feelings and gain support. Institutional and Youth Support We can help you create a plan for smoother transitions. Anyone can find it hard to move from one stage of life to the next, but people with disabilities often face unique and more complex challenges. Ensuring success for those with disabilities requires careful planning, cooperation, and resources. At [facility], we offer institutional and youth transitions support to provide a roadmap that helps families and individuals understand their rights, explore their needs, and cultivate a plan that provides them the best chance to succeed. Institutional transitions are when people with disabilities move from a structured setting, like a hospital or residential care center, to a  less structured one, like a community home. This change can be especially hard because it can throw off routines, cause changes in caregiving, and require getting used to a new setting. A person-centered plan that includes preferences, strengths, and wants is vital to ensure success. This plan should include a strong support team that understands the goal of institutional and youth transitions support and is focused on a smooth transition and continuity of care when available. In addition, family and caretaker support can fill in any missing pieces needed to craft a successful plan. Also included in institutional and youth transitions support is transitioning from a teenager to an adult. For people with disabilities, this time can be challenging since they often leave the structured school setting and need to navigate the often confusing world of adulthood. Learning the skills required to get a job, determining their college eligibility, and even understanding their options for independent living are all involved in a proper transitional plan. Like an institutional transition, the youth transition requires a skilled team to address the areas of concern and create a plan that covers all the bases. Our team can assist with institutional and youth transitions support by offering our years of experience in the field and access to much-needed training and resources.  Intake Process We follow a specific intake process to help the disabled.  At [facility], all participants must follow a specific intake process to benefit from our program. The first step involves contacting us  directly. At this point, we  will connect you with a Community Inclusion Specialist who will work with you to arrange an intake appointment. After filling out all paperwork at this appointment, you will receive an independent living plan that outlines your individual goals. Once you have this plan, you are on your way to accessing  a life of independence! For more information about our intake process or how to get started contact us  today.

## 2024-11-09 NOTE — Telephone Encounter (Signed)
 FYI Only or Action Required?: Action required by provider: clinical question for provider and update on patient condition.  Patient was last seen in primary care on 11/01/2024 by Willo Mini, NP.  Called Nurse Triage reporting Palpitations and Anxiety.  Symptoms began several weeks ago.  Interventions attempted: Prescription medications: Bactrim , doxycycline , zofran , potassium.  Symptoms are: anxiety, nausea, fatigue, SOB (increased use of nebulizer), palpitations, chest pressure intermittent (not present now), decreased appetite, insomnia, hot and cold flashes gradually worsening.  Triage Disposition: See HCP Within 4 Hours (Or PCP Triage)  Patient/caregiver understands and will follow disposition?: No, wishes to speak with PCP           Copied from CRM [id number]. Topic: Clinical - Red Word Triage >> Nov 09, 2024  1:15 PM Delon DASEN wrote: [facility] that prompted transfer to Nurse Triage: palpitations, panic attack, head feels numb- need help Reason for Disposition  [1] Difficulty breathing with exertion (e.g., walking) and [2] NEW or getting WORSE  Answer Assessment - Initial Assessment Questions 1. DESCRIPTION: Please describe your heart rate or heartbeat that you are having (e.g., fast/slow, regular/irregular, skipped or extra beats, palpitations)     Palpitations and feels like its beating out of my chest, she states she will sometimes wake up in a panic.  2. ONSET: When did it start? (e.g., minutes, hours, days)      Couple weeks, worsening.  3. DURATION: How long does it last (e.g., seconds, minutes, hours)     Varies. She states sometimes she can speak with someone on the phone and it resolves and other times is goes on and on.  4. PATTERN Does it come and go, or has it been constant since it started?  Does it get worse with exertion?   Are you feeling it now?     She states the palpitations are present now but have improved from earlier today.   5.  TAP: Using your hand, can you tap out what you are feeling on a chair or table in front of you, so that I can hear? Note: Not all patients can do this.       N/A.  6. HEART RATE: Can you tell me your heart rate? How many beats in 15 seconds?  Note: Not all patients can do this.       Blood pressure 121/75 and HR 84 taken during triage.  7. RECURRENT SYMPTOM: Have you ever had this before? If Yes, ask: When was the last time? and What happened that time?      Yes, she states last time it happened was as frequent. She states this started when her psychiatrist put her on buspirone . Patient states she has a 2pm appointment with psychiatry to discuss medication changes.  8. CAUSE: What do you think is causing the palpitations?     Anxiety, medications.  9. CARDIAC HISTORY: Do you have any history of heart disease? (e.g., heart attack, angina, bypass surgery, angioplasty, arrhythmia)      HTN.  10. OTHER SYMPTOMS: Do you have any other symptoms? (e.g., dizziness, chest pain, sweating, difficulty breathing)       Anxiety, nausea, decreased appetite, fatigue/generalized weakness, insomnia, hot and cold flashes, chest pressure (comes and goes, not present now), sinus infection and states she is being treated with doxycycline  100mg   and Bactrim  DS. She states she is using her nebulizer 4-5 times a day for SOB. She states due to the sinus infection she has not been able to wear her  Bipap. She states she has had lightheaded/faint feeling when she stands up. Denies any syncopal episodes or falls; severe difficulty breathing, suicidal plans/ideation.  Patient states she has been taking potassium, Zofran , antibiotics.   Patient states she was seen in the ED three times this past weekend. She has a virtual visit with PCP on Monday but is requesting a note sent to her PCP to have her direct admitted to a hospital or if she can be sent to a rehab facility/ALF to work on getting her medications  straightened out. Patient states she does not want to be sent to a pscyh ward and confirmed she has no suicidal ideations.  Advised patient to call rescue and go to ED if any worsening or severe SOB/chest pressure.  Protocols used: Heart Rate and Heartbeat Questions-A-AH

## 2024-11-09 NOTE — Addendum Note (Signed)
 Addended by: Raquan Iannone D on: 11/09/2024 04:35 PM   Modules accepted: Orders

## 2024-11-09 NOTE — Telephone Encounter (Signed)
 I cannot direct admit her.  And if they did not keep her in the emergency room then they must of felt like she was okay to go home.  Is it a specific medicine that she is concerned or is she getting them mixed up?

## 2024-11-09 NOTE — Telephone Encounter (Signed)
 Please advise

## 2024-11-10 ENCOUNTER — Telehealth (HOSPITAL_COMMUNITY): Payer: Self-pay | Admitting: Registered Nurse

## 2024-11-10 NOTE — Telephone Encounter (Signed)
 noted

## 2024-11-10 NOTE — Telephone Encounter (Signed)
 Patient called office - E2C2 did not feel comfortable relaying message from Dr. Alvan.  I spoke with patient- States that  she was given all new medications from the psychiatrist  They are causing nausea, difficulty eating, panic attacks, and  decreased potassium   States she spoke with her psychiatrist downstairs from our office - [place] who has decreased her medication strength and recommended that she be sent for medication management  she does not know what this means - she states she was told that she could be placed for medication management. She states she does not want to be sent to any behavioral health facility .  She does want to be admitted somewhere to get herself and medication straightened out ( mentioned that she wants to be around people as she is tired of being alone as well)   She was wanting to know if we could speak with her psychiatrist to see what she meant by Med management  and If Dr. Alvan could offer any help for her  in any other way.   The notes from [facility] would require someone to break the glass to read them and I do not have access for this.

## 2024-11-10 NOTE — Telephone Encounter (Signed)
 Patient called at 12:58p today 11/10/24.   Patient states she was going to kill herself Patient has intent to overdose on her medication.   Patient states she was tired of being alone. She is sick. She has COPD and has been unable to be treated for this. All of her new medications are making her panic attacks worse. She is nauseous, and unable to eat. Last meal was last night and it was a snack.  Patient states she has been to the hospital and they will not keep her. She feels like they need to because she is sick, and because now she can't get over these panic attacks. She again mentions her daughter left her and took grandchild. She is depressed and alone.   Patient does not have any weapons, or knifes, or anything to hurt herself with. There is no one with her or no one to call.   I called 911 and sent for a well check to her house. Gave the door code so they may enter her apartment.   I stayed on the phone with the patient until the sheriff got there.  At 1:19p the sheriff got there and I confirmed and we disconnected the line.

## 2024-11-12 NOTE — ED Provider Notes (Addendum)
 [facility]  ED Provider Note  Daneka Lantigua 63 y.o. female DOB: [birth year] MRN: [id number] History   Chief Complaint  Patient presents with  . Vomiting    Brought in by EMS for intermittent N/V/D for the last 2 weeks. Seen here yesterday for nerves, psych issues and discharged home. Goes to assisted living Monday. 4L [place] baseline. Hx of COPD and kidney disease.    64 year old presenting to us  for nausea, vomiting, and diarrhea.  Patient was discharged from our facility roughly 36 hours ago after follow-up with psychiatric and medical workup.  She returns to us  today stating she is still having nausea, vomiting, diarrhea that has been present for 2 weeks and that she can no longer live at home by herself.  She has been living by herself since her daughter kicked her out last year and she states she cannot take care of herself, cannot treat her COPD, and she cannot keep any food down and she needs to be in an assisted living home.  States that his CT scans and further blood work yesterday and did not find any causes for her N/V/D.  Seems she has been in the hospital a few times recently and has also taken a course of antibiotics, does not have a history of C. difficile as far as she is aware.  No fevers, breathing comfortably on her home 4 L.   Vomiting Associated symptoms: diarrhea        Past Medical History:  Diagnosis Date  . Allergic rhinitis 08/17/2009   Overview:  Qualifier: Diagnosis of  By: Alvan MD, Dorothyann   . Anemia 10/12/2014  . Anxiety and depression 04/08/2018  . Asthma (*)   . Bilateral carpal tunnel syndrome 03/14/2013  . Bipolar 1 disorder (*) 04/08/2018  . Chronic kidney disease   . Chronic respiratory failure with hypoxia, on home O2 therapy (*)   . CKD (chronic kidney disease) stage 3, GFR 30-59 ml/min (*) 10/26/2017  . COPD (chronic obstructive pulmonary disease) (*)   . Depression   . Diabetes mellitus, type 2 (*)   .  Diabetic peripheral neuropathy associated with type 2 diabetes mellitus (*) 10/12/2014  . Disc degeneration   . Gastroesophageal reflux disease without esophagitis 10/12/2014  . GERD (gastroesophageal reflux disease)   . Glaucoma   . Hyperlipidemia   . Hypertension   . Hypothyroidism   . RLS (restless legs syndrome) 01/14/2019   Last Assessment & Plan:  Discussed options.  We can try ropinirole  and try to wean the gabapentin .  She will decrease her gabapentin  down to 600 mg at bedtime for about 4 to 5 days and then decrease down to half a tab.  For 4 to 5 days and then can try the ropinirole  instead.  He does have a follow-up with neurology in about a month and will discuss it with them as well.  . Smoker   . Spinal stenosis of lumbar region 12/12/2008   Overview:  Lumbar laminectomy L4-5, L5-S1, discectomy of L5-S1 performed by Dr. Elsie Daring at [facility] on chronic pain medication since then.  . Teeth missing   . Vitamin B 12 deficiency 06/24/2011   Overview:  ON injections Q2 weeks.     Past Surgical History:  Procedure Laterality Date  . Back surgery    . Cesarean section    . Cholecystectomy    . Eye surgery Left 05/15/2016   LEFT EYE CATARACT EXTRACTION WITH LENS IMPLANT  .  Hysterectomy      Social History   Substance and Sexual Activity  Alcohol Use No   Tobacco Use History[1] E-Cigarettes  . Vaping Use Never User   . Start Date    . Cartridges/Day    . Quit Date     Social History   Substance and Sexual Activity  Drug Use No         Allergies[2]  Home Medications   ALBUTEROL  SULFATE HFA (PROVENTIL ,VENTOLIN ,PROAIR ) 108 (90 BASE) MCG/ACT INHALER    Inhale two puffs into the lungs every 6 (six) hours as needed.   ALPRAZOLAM  (XANAX ) 1 MG TABLET    Take one tablet (1 mg dose) by mouth 3 (three) times a day as needed for Sleep.   ALPRAZOLAM  (XANAX ) 1 MG TABLET    Take one tablet (1 mg dose) by mouth 2 (two) times a day as needed.    AMLODIPINE  BESYLATE (NORVASC ) 10 MG TABLET    Take one tablet (10 mg dose) by mouth daily.   AZELASTINE  137 MCG/SPRAY NASAL SPRAY    one spray by Nasal route daily as needed.   BELBUCA BUCCAL FILM    Place one Film (600 mcg dose) inside cheek every 12 (twelve) hours.   BRIMONIDINE -TIMOLOL  (COMBIGAN ) 0.2-0.5% OPHTHALMIC SOLUTION    Place one drop into the right eye every 12 (twelve) hours.   BRIMONIDINE -TIMOLOL  (COMBIGAN ) 0.2-0.5% OPHTHALMIC SOLUTION    Place one drop into both eyes every 12 (twelve) hours.   BUPRENORPHINE (BUTRANS) 10 MCG/HR    Place one patch onto the skin once a week.   BUPRENORPHINE (BUTRANS) 15 MCG/HR    Place one patch onto the skin once a week.   BUPRENORPHINE (BUTRANS) 20 MCG/HR    Place one patch onto the skin once a week. w/ 10mg    CHLORPHEN-PHENYLEPH-APAP 2-5-325 & 5-325 MG MISC    Take 2 tablets by mouth daily as needed.   DICLOFENAC  SODIUM (VOLTAREN ) 1% GEL    Place onto the skin 4 (four) times a day as needed.   DICLOFENAC  SODIUM (VOLTAREN ) 75 MG EC TABLET    Take one tablet (75 mg dose) by mouth 2 (two) times daily.   DOXYCYCLINE  HYCLATE (VIBRAMYCIN ) 100 MG CAPSULE    Take one capsule (100 mg dose) by mouth 2 (two) times daily for 10 days.   FLUTICASONE -UMECLIDINIUM-VILANTEROL (TRELEGY ELLIPTA ) 100-62.5-25 MCG/ACT AEPB INHALER    Inhale one puff into the lungs daily.   GABAPENTIN  (NEURONTIN ) 300 MG CAPSULE    Take two capsules (600 mg dose) by mouth at bedtime.   GABAPENTIN  (NEURONTIN ) 600 MG TABLET    Take one tablet (600 mg dose) by mouth at bedtime.   IPRATROPIUM-ALBUTEROL  (DUONEB) 0.5-2.5 MG/3 ML ML SOLN NEBULIZER SOLUTION    Take 3 mLs by nebulization 3 (three) times a day.   IPRATROPIUM-ALBUTEROL  (DUONEB) 0.5-2.5 MG/3 ML ML SOLN NEBULIZER SOLUTION    Inhale 3 mLs into the lungs every 6 (six) hours as needed.   IRON-FA-B CMP-C-BIOT-PROBIOTIC (FUSION PLUS) CAPS    Take one capsule by mouth daily.   LEVOCETIRIZINE (XYZAL ) 5 MG TABLET    Take one tablet (5 mg dose)  by mouth at bedtime.   LEVOTHYROXINE  SODIUM (SYNTHROID ,LEVOTHROID,LEVOXYL ) 88 MCG TABLET    Take one tablet (88 mcg dose) by mouth every morning.   LOSARTAN  POTASSIUM (COZAAR ) 100 MG TABLET    Take one tablet (100 mg dose) by mouth every morning. NEEDS APPT. FOR MORE REFILLS   MAGNESIUM  OXIDE 400 TABS  Take one tablet (400 mg dose) by mouth daily.   OXYGEN     Inhale 4 L into the lungs daily as needed (for shortness of breath). AMBULATORY NON FORMULARY MEDICATION 2-4L   PANTOPRAZOLE  SODIUM (PROTONIX ) 40 MG TABLET    TAKE 1 TABLET (40 MG TOTAL) BY MOUTH 2 (TWO) TIMES DAILY.   POTASSIUM CHLORIDE  10 MEQ CR TABLET    Take one tablet (10 mEq dose) by mouth 2 (two) times daily.   PROMETHAZINE  (PHENERGAN ) 25 MG TABLET    Take one tablet (25 mg dose) by mouth every 6 (six) hours as needed.   ROSUVASTATIN  CALCIUM  (CRESTOR ) 20 MG TABLET    Take one tablet (20 mg total) by mouth daily.   SERTRALINE  (ZOLOFT ) 100 MG TABLET    Take two tablets (200 mg dose) by mouth at bedtime.   SODIUM CHLORIDE -SODIUM BICARB 1.57 G PACK    Take 1 packet by nebulization every evening.   SULFAMETHOXAZOLE -TRIMETHOPRIM  (BACTRIM  DS) 800-160 MG PER TABLET    Take one tablet by mouth 2 (two) times daily for 10 days.   THEOPHYLLINE  (THEODUR) 300 MG 12 HR TABLET    Take one tablet (300 mg dose) by mouth 2 (two) times daily.   TIZANIDINE  (ZANAFLEX ) 2 MG TABLET    Take one tablet (2 mg dose) by mouth 2 (two) times a day as needed.   TIZANIDINE  (ZANAFLEX ) 4 MG TABLET    Take one half tablet (2 mg dose) by mouth at bedtime as needed.   TRAZODONE  (DESYREL ) 50 MG TABLET    Take one tablet (50 mg dose) by mouth at bedtime.   TRELEGY ELLIPTA  100-62.5-25 MCG/ACT AEPB INHALER    Inhale one puff into the lungs 2 (two) times daily.   VILAZODONE  (VIIBRYD ) 40 MG TABS TABLET    Take one tablet (40 mg dose) by mouth daily.    Primary Survey  Primary Survey  Review of Systems   Review of Systems  Constitutional: Negative.   HENT: Negative.     Respiratory: Negative.    Cardiovascular: Negative.   Gastrointestinal:  Positive for diarrhea, nausea and vomiting.  Genitourinary: Negative.   Musculoskeletal: Negative.     Physical Exam   ED Triage Vitals  BP 11/12/24 1228 117/76  Heart Rate 11/12/24 1228 81  Resp 11/12/24 1228 20  SpO2 11/12/24 1750 98 %  Temp 11/12/24 1228 98 F (36.7 C)    Physical Exam  Constitutional: She is in poor hygiene. She has poor hygiene. She does not appear distressed, appears ill and no respiratory distress.  Patient appears older than stated age, obese, somewhat unkempt and mildly ill-appearing  HENT:  Head: Normocephalic and atraumatic.  Eyes: EOM are intact.  Neck: Normal range of motion.  Cardiovascular: Normal rate, regular rhythm and intact distal pulses.  Pulmonary/Chest: No respiratory distress. Respiratory effort normal and breath sounds normal.  Abdominal: Soft. There is no abdominal tenderness. There is no guarding. Abdomen not distended.  Musculoskeletal: Normal range of motion.     Cervical back: Normal range of motion.   Neurological: She is alert and oriented to person, place, and time. Moves all extremities equally. She has normal speech. Cranial nerves intact II through XII.  Skin: Skin is warm. Skin is dry.     ED Course   Lab results:   CBC AND DIFFERENTIAL - Abnormal      Result Value   WBC 8.7     RBC 3.86 (*)    HGB 11.2  HCT 35.5     MCV 92.0     MCH 29.0     MCHC 31.5 (*)    Plt Ct 278     RDW SD 45.5     MPV 10.2     NRBC% 0.0     Absolute NRBC Count 0.00     NEUTROPHIL % 58.8     LYMPHOCYTE % 27.1     MONOCYTE % 8.8     Eosinophil % 3.8     BASOPHIL % 1.3     IG% 0.2     ABSOLUTE NEUTROPHIL COUNT 5.14     ABSOLUTE LYMPHOCYTE COUNT 2.37     Absolute Monocyte Count 0.77     Absolute Eosinophil Count 0.33     Absolute Basophil Count 0.11     Absolute Immature Granulocyte Count 0.02    COMPREHENSIVE METABOLIC PANEL - Abnormal   Na 140      Potassium 3.6 (*)    Cl 100     CO2 28     AGAP 12     Glucose 88     BUN 8     Creatinine 1.30 (*)    Ca 9.8     ALK PHOS 117     T Bili 0.4     Total Protein 6.7     Alb 3.5 (*)    GLOBULIN 3.2     ALBUMIN/GLOBULIN RATIO 1.1     BUN/CREAT RATIO 6.2 (*)    ALT 22     AST 32     eGFR 47 (*)    Comment: Normal GFR (glomerular filtration rate) > 60 mL/min/1.73 meters squared, < 60 may include impaired kidney function. Calculation based on the Chronic Kidney Disease Epidemiology Collaboration (CK-EPI)equation refit without adjustment for race.  C. DIFFICILE PCR  BIOFIRE GI PANEL  URINE CUP   Narrative:    The following orders were created for panel order Urine Cup Urine, Clean Catch. Procedure                               Abnormality         Status                    ---------                               -----------         ------                    Urine Rle[[id number]]                                                                  Please view results for these tests on the individual orders.  POCT GLUCOSE  POCT GLUCOSE  POCT GLUCOSE  POCT GLUCOSE  GOLD SST  URINE CUP    Imaging: No data to display   ECG: ECG Results          ECG 12 lead (Final result)      Collection Time Result Time Acquisition Device Ventricular Rate Atrial Rate P-R Interval QRS Duration Q-T Interval  QTC Calculation(Bazett) Calculated P Axis Calculated R Axis Calculated T Axis   11/12/24 12:42:57 11/12/24 14:26:43 D3K 78 78 162 84 388 442 71 -20 66         Collection Time Result Time ECGDiag   11/12/24 12:42:57 11/12/24 14:26:43 Normal sinus rhythm Normal ECG When compared with ECG of 10-Nov-2024 18:01, No significant change was found Adolph Roers ([id number]) on 11/12/2024 2:26:42 PM certifies that he/she has reviewed the ECG tracing and confirms the independent interpretation is correct.               Final result                                                                          Time Onset of Severe Sepsis/Septic Shock:                   Pre-Sedation Procedures  ED Course as of 11/12/24 1841  Roers Lamer Autry's Documentation  Dju Nov 12, 2024  2422 63 year old presenting to us  for N/V/D with complaints of inability to complete her ADLs.  Unremarkable vital signs and is nontoxic in appearance.  Will repeat basic labs along with obtaining GI panel and culture along with stool studies to evaluate for evidence of C. difficile given her risk factors.  Will also consult case management for possible ALF placement.  Seems that she was recently set up with home health but patient would like ALF placement.  1444 Basic labs unremarkable.  Unable to obtain stool sample as detailed nursing note as of this time.  Case management evaluating patient, recommend PT eval and they will work to see if she meets criteria for placement.  1801 Patient still being evaluated by PT and case management for possible placement.  Will provide DuoNeb per her request for her underlying COPD.  1838 Patient requested to speak with me at approximately 1840.  Notes that she does not want to stay in the hospital for further case management evaluation for ALF placement.  She wants to know why she is continuing to feel nauseous despite unremarkable imaging obtained 2 days ago and unremarkable labs today.  I informed her that she would likely need further GI studies and potentially endoscopy but that we unfortunately cannot offer that to her tonight and we do not have a reason to transfer to another facility for nonemergent endoscopy.  Patient is requesting discharge at that time with refill on her Zofran .  Patient understands risks and benefits of going home and has capacity.  Patient was then discharged per her request with a refill for Zofran .   Medical Decision Making Amount and/or Complexity of Data Reviewed Labs: ordered. ECG/medicine tests:  ordered.  Risk Prescription drug management.          Provider Communication  New Prescriptions   ONDANSETRON  (ZOFRAN -ODT) 4 MG DISINTEGRATING TABLET    Take one tablet (4 mg dose) by mouth every 8 (eight) hours as needed for Nausea for up to 20 doses.      Quantity: 12 tablet    Refills: 0    Modified Medications   No medications on file    Discontinued Medications   No medications on file    Clinical Impression  Final diagnoses:  Nausea and vomiting, unspecified vomiting type  Chronic obstructive pulmonary disease, unspecified COPD type (*)    ED Disposition     ED Disposition  Discharge   Condition  Stable   Comment  --                   Electronically signed by:    Aloha Theo Furth, MD 11/12/24 1344       [1] Social History Tobacco Use  Smoking Status Every Day  . Current packs/day: 2.00  . Types: Cigarettes  Smokeless Tobacco Never  [2] Allergies Allergen Reactions  . Drug Class [Risperidone  And Related] Shortness Of Breath  . Amoxicillin -Pot Clavulanate Rash    Gets yeast infection every time  . Aripiprazole Unknown    REACTION: muscle jerks  . Fluoxetine Hcl Unknown    REACTION: Intolerant  . Geodon   [Kdc:Red Dye+Yellow Dye+Ziprasidone ] Other    shakes  . Montelukast  Other    it kept me sick with an upper respiratory infection  . Nsaids Other    Upper GI bleed  . Paroxetine Unknown    REACTION: Intolerance  . Varenicline  Other    messing with my mood  . Ziprasidone  Hcl Other    shakes   Aloha Theo Furth, MD 11/12/24 [phone]

## 2024-11-14 ENCOUNTER — Inpatient Hospital Stay: Admitting: Family Medicine

## 2024-11-15 ENCOUNTER — Ambulatory Visit: Payer: Self-pay | Admitting: *Deleted

## 2024-11-15 NOTE — Telephone Encounter (Signed)
 I am happy to fill out FLT if they want to fax it to our office.

## 2024-11-15 NOTE — Telephone Encounter (Signed)
 FYI Only or Action Required?: Action required by provider: request for appointment.  Patient was last seen in primary care on 11/01/2024 by Willo Mini, NP.  Called Nurse Triage reporting paperwork request.  Symptoms began several weeks ago.  Interventions attempted: Rest, hydration, or home remedies.  Symptoms are: gradually worsening.  Triage Disposition: See PCP Within 2 Weeks  Patient/caregiver understands and will follow disposition?: No, wishes to speak with PCP  Copied from CRM #[id number]. Topic: Clinical - Red Word Triage >> Nov 15, 2024 12:41 PM Cleave MATSU wrote: Red Word that prompted transfer to Nurse Triage: saleena from [facility] stated that forrfyth needs fl2 paperwork filled out for her to get placement in assisted living . they stated that after the visit and the paperwork is filled out dr need to call [place] placement line at [phone]. Pt is having difficulties taking care of her self at home Reason for Disposition  Requesting regular office appointment  Answer Assessment - Initial Assessment Questions Lillia Dux calling to report patient status and request assistance for patient:  1. REASON FOR CALL: What is the main reason for your call? or How can I best help you?     Requesting appointment for assessment for [facility] paperwork. Patient is requesting video call- patient lives on second floor, on O2 and unable to leave the home- no support or transportation- unable to get down stairs at apartment( she is on wait list for first floor apartment) 2. SYMPTOMS : Do you have any symptoms?      3 ER visits in past 2 weeks, weakness, very difficult to self care and would benefit from assisted living placement 3. OTHER QUESTIONS: Do you have any other questions?     Patient is managing at home- just very difficult. Patient is eating (microwave meals), requires assistance with bath( is bale to keep herself clean). Things are getting more difficult  and patient is weaker- she is not getting a lot of benefit from home therapy   Request: Call patient to schedule appointment requesting as soon as possible- this is process that will take time and would like to get started as soon as possible. Patient would also like PCP to manage depression- she is not fond of current provider- psychiatrist. Medications have been changed and did not help- added buspirone  and hydroxyzine - patient has stopped these medications.  Protocols used: Information Only Call - No Triage-A-AH

## 2024-11-16 ENCOUNTER — Telehealth: Payer: Self-pay

## 2024-11-16 NOTE — Telephone Encounter (Signed)
 Copied from CRM [id number]. Topic: Clinical - Home Health Verbal Orders >> Nov 16, 2024  9:36 AM Anairis L wrote: Caller/Agency: Vertell Dux [facility]  Callback Number: [phone] Secure VM Service Requested: Skilled Nursing Frequency: 1 week for 12/25/2024 Any new concerns about the patient? No

## 2024-11-16 NOTE — Telephone Encounter (Signed)
Okay for skilled nursing

## 2024-11-21 ENCOUNTER — Other Ambulatory Visit: Payer: Self-pay | Admitting: Family Medicine

## 2024-11-21 NOTE — Telephone Encounter (Signed)
 LM with VO per Dr. Alvan

## 2024-11-22 ENCOUNTER — Other Ambulatory Visit: Payer: Self-pay | Admitting: Family Medicine

## 2024-11-22 ENCOUNTER — Telehealth: Payer: Self-pay | Admitting: Family Medicine

## 2024-11-22 ENCOUNTER — Telehealth: Payer: Self-pay

## 2024-11-22 NOTE — Telephone Encounter (Signed)
 Copied from CRM #[id number]. Topic: General - Other >> Nov 22, 2024  1:43 PM Amy B wrote: Reason for CRM: FYI  Patient called to let her provider know that she is currently looking for a new psychiatrist.  The one she was referred to put her on different medications that made her sick and she left messages with no response.

## 2024-11-22 NOTE — Telephone Encounter (Unsigned)
 Copied from CRM [id number]. Topic: Clinical - Medication Refill >> Nov 22, 2024  1:38 PM Amy B wrote: Medication:  promethazine  (PHENERGAN ) 25 MG tablet ALPRAZolam  (XANAX ) 1 MG tablet ondansetron  (ZOFRAN -ODT) 4 MG disintegrating tablet  Has the patient contacted their pharmacy? Yes (Agent: If no, request that the patient contact the pharmacy for the refill. If patient does not wish to contact the pharmacy document the reason why and proceed with request.) (Agent: If yes, when and what did the pharmacy advise?)  This is the patient's preferred pharmacy:  [facility] [phone] - [place], [place] - [address] [place] [address] Phone: [phone] Fax: [phone]  Is this the correct pharmacy for this prescription? Yes If no, delete pharmacy and type the correct one.   Has the prescription been filled recently? No  Is the patient out of the medication? No  Has the patient been seen for an appointment in the last year OR does the patient have an upcoming appointment? Yes  Can we respond through MyChart? Yes  Agent: Please be advised that Rx refills may take up to 3 business days. We ask that you follow-up with your pharmacy.

## 2024-11-22 NOTE — Addendum Note (Signed)
 Addended by: Herschell Virani P on: 11/22/2024 05:07 PM   Modules accepted: Orders

## 2024-11-22 NOTE — Telephone Encounter (Signed)
 Please see message from patient  Requesting rx rf of  Alprazolam  last written 09/23/2024 Zofran  last written 10/24/2024 Phenergan  last written 10/24/2024 Last OV 11/01/2024 telemedicine Upcoming appt 09/05/2025

## 2024-11-23 ENCOUNTER — Encounter: Payer: Self-pay | Admitting: Family Medicine

## 2024-11-23 ENCOUNTER — Telehealth (HOSPITAL_COMMUNITY): Payer: Self-pay | Admitting: Registered Nurse

## 2024-11-23 DIAGNOSIS — E876 Hypokalemia: Secondary | ICD-10-CM

## 2024-11-23 NOTE — Telephone Encounter (Signed)
 Patient called to cancel both follow up medication management appointment and New Patient Therapy appointment - declined to reschedule both.

## 2024-11-24 ENCOUNTER — Telehealth: Payer: Self-pay | Admitting: Family Medicine

## 2024-11-24 ENCOUNTER — Other Ambulatory Visit: Payer: Self-pay | Admitting: Family Medicine

## 2024-11-24 DIAGNOSIS — J9621 Acute and chronic respiratory failure with hypoxia: Secondary | ICD-10-CM

## 2024-11-24 DIAGNOSIS — G8929 Other chronic pain: Secondary | ICD-10-CM

## 2024-11-24 DIAGNOSIS — E1142 Type 2 diabetes mellitus with diabetic polyneuropathy: Secondary | ICD-10-CM

## 2024-11-24 DIAGNOSIS — F3132 Bipolar disorder, current episode depressed, moderate: Secondary | ICD-10-CM

## 2024-11-24 MED ORDER — POTASSIUM CHLORIDE CRYS ER 10 MEQ PO TBCR: 10.0000 meq | EXTENDED_RELEASE_TABLET | Freq: Two times a day (BID) | ORAL | 1 refills | Status: DC

## 2024-11-24 MED ORDER — MAGNESIUM 400 MG PO TABS: 1.0000 | ORAL_TABLET | Freq: Two times a day (BID) | ORAL | 1 refills | Status: DC

## 2024-11-24 MED ORDER — ONDANSETRON 4 MG PO TBDP: 4.0000 mg | ORAL_TABLET | Freq: Three times a day (TID) | ORAL | 0 refills | Status: DC | PRN

## 2024-11-24 MED ORDER — PROMETHAZINE HCL 25 MG PO TABS: 25.0000 mg | ORAL_TABLET | Freq: Four times a day (QID) | ORAL | 0 refills | Status: AC | PRN

## 2024-11-24 MED ORDER — PROMETHAZINE HCL 25 MG PO TABS: 25.0000 mg | ORAL_TABLET | Freq: Four times a day (QID) | ORAL | 0 refills | Status: DC | PRN

## 2024-11-24 NOTE — Telephone Encounter (Signed)
 Meds ordered this encounter  Medications   DISCONTD: promethazine  (PHENERGAN ) 25 MG tablet    Sig: Take 1 tablet (25 mg total) by mouth every 6 (six) hours as needed for nausea or vomiting.    Dispense:  30 tablet    Refill:  0    Please deliver   DISCONTD: ondansetron  (ZOFRAN -ODT) 4 MG disintegrating tablet    Sig: Take 1 tablet (4 mg total) by mouth every 8 (eight) hours as needed for nausea or vomiting.    Dispense:  20 tablet    Refill:  0

## 2024-11-24 NOTE — Telephone Encounter (Signed)
 Okay to cancel [facility].  I do not have a way to do that officially without us  just calling them and canceling it but I did go ahead and put a new referral in for [facility].  Orders Placed This Encounter  Procedures   Ambulatory referral to Home Health    Referral Priority:   Routine    Referral Type:   Home Health Care    Referral Reason:   Specialty Services Required    Requested Specialty:   Home Health Services    Number of Visits Requested:   1

## 2024-11-24 NOTE — Telephone Encounter (Signed)
 Yes needs labs for potassium and magnsium.  Can fax to home health to have drawn.  We can refill.     Meds ordered this encounter  Medications   potassium chloride  (KLOR-CON  M) 10 MEQ tablet    Sig: Take 1 tablet (10 mEq total) by mouth 2 (two) times daily.    Dispense:  60 tablet    Refill:  1   Magnesium  400 MG TABS    Sig: Take 1 tablet by mouth in the morning and at bedtime.    Dispense:  60 tablet    Refill:  1

## 2024-11-24 NOTE — Telephone Encounter (Signed)
 Received the following message from Rowena Kuster with [facility] health;  My clinical mgr reached out on Krystal Burgess 08/24/2063.  The clinical team seeing the patient has concerns on the patients mental health and feel she would benefit from a Psych nurse seeing her and we do not have one on staff.  We did reach out to [facility] and confirmed they have a Set designer.  Requesting provider [facility] and send new Psych nurse order to [facility] to assist patient in her care.  I had my manager send me a snip it of what was going on and she has been banned from seeing her DTR and Gson which is making her situation worse emotionally.  She was also see in the ED recently with concern of medication overdose.  Please advise.  Notes documented from her chart;  Krystal Burgess- [facility] Psych referral WHEN PATIENT CALLED THE NURSE TO SCHEDULE LAST NIGHT PATIENT WAS VERY UPSET STATING THAT SHE DESPERATELY NEEDS A SOCIAL WORKER TO SEE HER BECAUSE SHE WAS UNABLE TO CARE FOR HERSELF. PATIENT EXPRESSED GRIEVANCE OVER BEING KICKED OUT OF HER DAUGHTER'S HOME AND THE INABILITY TO SEE HER GRANDSON RECENTLY. PATIENT STATES THAT SHE HAS BEEN TO THE EMERGENCY DEPARTMENT 3 TIMES OVER THE PAST 2 WEEKS ATTEMPTING TO GET HELP AND SHE IS SENT HOME EACH TIME. NURSE ARRIVED AT PLAN WITH THE PATIENT FOR THE NEXT DAY'S VISIT INCLUDING CONTACTING HOSPICE AS WELL AS SOCIAL SERVICES OF [facility].   PRIOR TO NURSING VISIT SN SPOKE WITH Krystal Burgess, HOSPICE LIASON, WHO SPOKE WITH Krystal Burgess OVER [facility] WHO STATES PT DOES NO QUALIFY FOR HOSPICE AT THIS TIME. ALSO SPOKE TO Krystal Burgess MANAGER WITH [facility] REGARDING ISSUES.   ADD NURSING VISIT TODAY PATIENT IS VERY EMOTIONAL. PATIENT CRIES MULTIPLE TIMES THROUGHOUT THE VISIT STATING THAT SHE MISSES HER GRANDSON SO MUCH AND SHE WISHES THAT SHE COULD SEE HIM AND HER DAUGHTER HOWEVER HER DAUGHTER WILL NOT SPEAK TO HER AT THIS TIME. NURSE PROVIDED EMOTIONAL SUPPORT AND ALSO  DISCUSSED THAT PATIENT SAW A PSYCHIATRIST 2 WEEKS AGO WHO GAVE HER BUSPAR  AND HYDROXYZINE  HOWEVER PATIENT STATES THAT THESE MEDICATIONS MADE HER FEEL WORSE MENTALLY AND SHE STOPPED TAKING THEM. NURSE UPDATED MEDICATION LIST BUT ALSO EDUCATED THE PATIENT THAT OFTENTIMES WITH PSYCHIATRIC MEDICATION SUCH AS BUSPAR  SYMPTOMS MAY BECOME WORSE BEFORE THEY GET BETTER AND THEY CAN TAKE 4-6 WEEKS TO SEE IMPROVEMENT. PATIENT VERBALIZED UNDERSTANDING STATING THAT SHE SCARCE HERSELF BEING THERE BY HERSELF WITH HER MENTAL HEALTH GETTING WORSE. NURSE ASKED THE PATIENT IF SHE WANTED TO HARM HERSELF AND SHE STATED KNOW THAT SHE JUST WANTED TO NOT BE ALONE ANYMORE. NURSE EDUCATED THE PATIENT THAT IF SHE WERE TO HAVE ANY KIND OF SUICIDAL IDEATIONS OR THOUGHTS OF HARMING HERSELF THEN SHE NEEDED TO CONTACT 911 IMMEDIATELY. NURSE ALSO EDUCATED THE PATIENT THAT IF SHE WANTED INPATIENT FACILITY ASSISTANCE WITH GETTING THROUGH HER DEPRESSION THEN NURSE CAN CONTACT 911 TO TAKE HER TO THE HOSPITAL FOR EVALUATION HOWEVER PATIENT STATES THAT SHE DOES NOT WANT TO HAVE INPATIENT ASSISTANCE AND DOES NOT WANT TO HARM HERSELF AT THIS TIME. NURSE EDUCATED THE PATIENT THAT HER ER VISIT RECENTLY STATED SUICIDAL IDEATIONS AND PATIENT STATES THE ONLY REASON THAT SHE TOLD THEM THIS WAS BECAUSE SHE WAS TRYING TO GET HELP FINDING ASSISTED LIVING PLACEMENT.   NURSE EVALUATED THE PATIENT'S LIVING ENVIRONMENT AND PATIENT'S ABILITY TO CARE FOR HERSELF AT HOME. PATIENT IS ON OXYGEN  CONTINUOUSLY AND DOES HAVE WEAKNESS REQUIRING A ROLLING WALKER TO AMBULATE. PATIENT  LIVES IN A SECOND STORY APARTMENT WHICH CAUSES HER TO NOT LEAVE THE HOME. NURSE INQUIRED WITH THE PATIENT ON HOW SHE WOULD GET OUT IF THERE WERE A FIRE AND PATIENT STATES THAT SHE WOULD GO DOWN THE STAIRS. WHEN PATIENT CAME HOME FROM THE EMERGENCY DEPARTMENT SHE DID WALK ON HER OWN STAIRS WITH THE ASSISTANCE OF HER NEIGHBOR. PATIENT IS ABLE TO KEEP HERSELF CLEAN AND COMPLETE SPONGE BATHS WITH  DIFFICULTY. 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 PATIENT STATES DUE TO HER DEPRESSION SHE HAS NOT WANTED TO EAT VERY MUCH LATELY BUT HAS BEEN EATING. THIS NURSE SEES A LOT OF STRAIN WITH PATIENT TAKING CARE OF HERSELF HOWEVER DOES NOT FIND THE PATIENT ON SAFE IN HER HOME AT THIS TIME   PATIENT EXPRESSES THAT SHE REALLY WANTS TO GO TO AN ASSISTED LIVING FACILITY IF SHE IS NOT ABLE TO LIVE WITH HER DAUGHTER. DAUGHTER IS CURRENTLY NOT SPEAKING TO HER. NURSE CONTACTED [facility] ADULT SERVICES AND SN SPOKE TO REPRESENTATIVE  WHO ADVISED: MAKE AN APT WITH PCP FOR FL2 PAPERWORK TO BE FILLED OUT, THEN MD TO  CALL [place] [facility] LINE AT [phone] AND GET FAX, THEN A CASE WORKER FROM PLACEMENT WILL WORK WITH THE PATIENT FOR ASSISTED LIVING OR SNF PLACES IN HER BUDGET (BASED ON LEVEL OF CARE NEEDED ON FL2 PAPERWORK) OR WILL FILL OUT LONG TERM MEDICAID PAPERWORK OUT WITH HER. PATIENT IS AGREEABLE WITH THIS PLAN OF ACTION.   NURSE CONTACTED PATIENT'S PRIMARY CARE PROVIDER TO REQUEST A VIDEO VISIT BE SCHEDULED FOR THIS PAPERWORK TO BE FILLED OUT HOWEVER THE NEXT AVAILABLE APPOINTMENT IS THE END OF DECEMBER AND THE REPRESENTATIVE ON THE PHONE AT THE DOCTOR'S OFFICE WAS NOT SURE IF THIS COULD BE A VIDEO VISIT OR IF IT NEEDED TO BE IN PERSON. NURSE EXPRESSED THE PTS LACK OF TRANSPORTATION AS WELL AS THE DIFFICULTY GETTING DOWN HER STAIRS AND REQUESTED A VIDEO VISIT BE MADE. NURSE AT THE DOCTOR'S OFFICE IS TO CALL THE PATIENT BACK TO SCHEDULE THIS ONCE SHE GETS CONFIRMATION THAT THIS CAN BE A VIDEO VISIT. NURSE ALSO INFORMED THE DOCTOR'S OFFICE THAT THE PATIENT STATES SHE DOES NOT WANT TO SEE HER PSYCHIATRIST ANYMORE AND SHE WOULD LIKE HER PRIMARY CARE PROVIDER TO MANAGE HER PSYCH MEDICATIONS. NURSE INFORMED THEM THAT SHE IS NOT TAKING HER BUSPAR  OR HYDROXYZINE  THAT WAS PRESCRIBED BY THE PSYCHIATRIC PROVIDER. NURSE ALSO REPORTED  PATIENT'S DEPRESSION AS WELL AS MULTIPLE EMERGENCY DEPARTMENT VISITS WITHIN THE LAST 2 WEEK.

## 2024-11-24 NOTE — Addendum Note (Signed)
 Addended by: Ras Kollman D on: 11/24/2024 06:18 PM   Modules accepted: Orders

## 2024-11-25 NOTE — Telephone Encounter (Signed)
 Attempted call to [facility] - skilled nursing [facility] [phone] and [facility] # [phone] Left a voice mail requesting a return call with fax # to fax over lab orders to be drawn at patient's home.- will await a return call.

## 2024-11-25 NOTE — Telephone Encounter (Signed)
 Zofran  and Phenergan  refilled because this was the E2 C2 note they failed to send so I had to open and orders encounter to send.

## 2024-11-28 ENCOUNTER — Encounter: Payer: Self-pay | Admitting: Family Medicine

## 2024-11-28 ENCOUNTER — Ambulatory Visit: Payer: Self-pay

## 2024-11-28 NOTE — Telephone Encounter (Unsigned)
 Copied from CRM [id number]. Topic: Clinical - Medical Advice >> Nov 28, 2024  1:00 PM Cleave MATSU wrote: Reason for CRM: pt insisted on speaking to a nurse in regards to a referral to [facility] home health and pneumonia shot also getting blood drawn.

## 2024-11-28 NOTE — Telephone Encounter (Signed)
 Left message cancelling the home health with [facility].

## 2024-11-28 NOTE — Telephone Encounter (Signed)
 FYI Only or Action Required?: Action required by provider: clinical question for provider and update on patient condition.  Patient was last seen in primary care on 11/01/2024 by Willo Mini, NP.  Called Nurse Triage reporting Advice Only.  Triage Disposition: Call PCP Within 24 Hours  Patient/caregiver understands and will follow disposition?: Yes   Copied from CRM #[id number]. Topic: Clinical - Medical Advice >> Nov 28, 2024  1:00 PM Cleave MATSU wrote: Reason for CRM: pt insisted on speaking to a nurse in regards to a referral to [facility] home health and pneumonia shot also getting blood drawn. Reason for Disposition  [1] Follow-up call from patient regarding patient's clinical status AND [2] information NON-URGENT  Answer Assessment - Initial Assessment Questions 1. REASON FOR CALL or QUESTION: What is your reason for calling today? or How can I best     Pt called in stating that she had a HH visit with [facility], 12/05, but knew she had a pending referral to [facility]; calling in to f/u on process. She states that she just wanted to clarify that [facility] will still come to her and if it will be RN or RN and PT; also questioned about labs being drawn at home and when to get her pneumonia vaccine. She stated at this time she is still congested so she was unsure if she could get vaccine; when she asked [facility] RN last Friday, RN referred her back to PCP d/t congestion.  Discussed that referral to [facility] has been sent and they will f/u with her. She voiced appreciation. Please advise.  Protocols used: PCP Call - No Triage-A-[facility]

## 2024-11-28 NOTE — Telephone Encounter (Signed)
 Copied from CRM [id number]. Topic: Referral - Question >> Nov 25, 2024  4:26 PM Everette C wrote: Reason for CRM: Vertell with [facility] has called to ask that a member of practice staff please contact the patient to schedule an appointment to address behavioral health concerns so that the patient can be seen by a home health provider (possibly [facility]) that offers psych nursing. Please contact Vertell further at [phone] when possible to discuss scheduling.

## 2024-11-29 ENCOUNTER — Encounter: Payer: Self-pay | Admitting: Family Medicine

## 2024-11-29 ENCOUNTER — Telehealth: Payer: Self-pay

## 2024-11-29 ENCOUNTER — Other Ambulatory Visit: Payer: Self-pay | Admitting: Family Medicine

## 2024-11-29 DIAGNOSIS — F3132 Bipolar disorder, current episode depressed, moderate: Secondary | ICD-10-CM

## 2024-11-29 DIAGNOSIS — F332 Major depressive disorder, recurrent severe without psychotic features: Secondary | ICD-10-CM

## 2024-11-29 MED ORDER — ALPRAZOLAM 1 MG PO TABS: 0.5000 mg | ORAL_TABLET | Freq: Two times a day (BID) | ORAL | 0 refills | Status: AC | PRN

## 2024-11-29 NOTE — Telephone Encounter (Signed)
 Would you please assist the patient in scheduling a virtual appt with Dr. Alvan?

## 2024-11-29 NOTE — Telephone Encounter (Signed)
 Addressed in a separate encounter

## 2024-11-29 NOTE — Telephone Encounter (Signed)
 Attempted call to [employer]. Would not tell me what was needed  from our office without the patient on the call as well. NO form showing in Media tab for completion.

## 2024-11-29 NOTE — Telephone Encounter (Signed)
 Meds ordered this encounter  Medications   ALPRAZolam  (XANAX ) 1 MG tablet    Sig: Take 0.5-1 tablets (0.5-1 mg total) by mouth 2 (two) times daily as needed for anxiety. Use sparingly    Dispense:  60 tablet    Refill:  0    Please deliver   USE SPARINGLY< AS NEEDED NOT SCHEDULED

## 2024-11-29 NOTE — Telephone Encounter (Signed)
 Copied from CRM [id number]. Topic: General - Other >> Nov 28, 2024  3:46 PM Rosaria E wrote: Reason for CRM: [employer] called to report that they have faxed a form back to the office because the form is incomplete. PCP needs to check the box for the patient to have power.

## 2024-11-29 NOTE — Telephone Encounter (Signed)
 I am very confused.  When I look at the form it very clearly says severe COPD and it says that the equipment that requires power is her oxygen  so I am not quite sure what they are needing.  We may need to call the patient to clarify.  It is very clear on the form that was filled out back in August.

## 2024-11-29 NOTE — Telephone Encounter (Signed)
 LM for clarification as to why HH needs to be cancelled.

## 2024-11-29 NOTE — Telephone Encounter (Signed)
 Copied from CRM [id number]. Topic: Referral - Question >> Nov 25, 2024  4:26 PM Everette C wrote: Reason for CRM: Vertell with [facility] has called to ask that a member of practice staff please contact the patient to schedule an appointment to address behavioral health concerns so that the patient can be seen by a home health provider (possibly [facility]) that offers psych nursing. Please contact Vertell further at [phone] when possible to discuss scheduling. >> Nov 28, 2024  3:23 PM Amy B wrote: Received call from Liz with [facility].  She requests a call back regarding cancelling home health.  Attempted warm transfer but no one answered CAL.  Please call her at [phone]

## 2024-11-29 NOTE — Telephone Encounter (Signed)
 Note this issue has been addressed by Annabella Rigg in separate message and forwarded to Dr. Alvan for review.

## 2024-11-29 NOTE — Telephone Encounter (Signed)
 Copied from CRM #[id number]. Topic: Appointments - Scheduling Inquiry for Clinic >> Nov 28, 2024  3:44 PM Rosaria E wrote: Reason for CRM: Pt needs a virtual visit urgently with PCP prior to being admitted to [facility] home care. Please advise, pt declined next available.   Best contact: [phone]

## 2024-11-29 NOTE — Telephone Encounter (Deleted)
 Copied from CRM [id number]. Topic: Referral - Question >> Nov 25, 2024  4:26 PM Everette C wrote: Reason for CRM: Vertell with [facility] has called to ask that a member of practice staff please contact the patient to schedule an appointment to address behavioral health concerns so that the patient can be seen by a home health provider (possibly [facility]) that offers psych nursing. Please contact Vertell further at [phone] when possible to discuss scheduling.

## 2024-11-30 ENCOUNTER — Ambulatory Visit (HOSPITAL_COMMUNITY): Admitting: Licensed Clinical Social Worker

## 2024-11-30 NOTE — Telephone Encounter (Signed)
 Congestion and sinus sxs are not a contrindication to flu facccine.  Yes, she can have a flu vaccine.

## 2024-11-30 NOTE — Telephone Encounter (Signed)
 I will need a blank form to complete.  The previous form is 16 months old, I can't  addend it.

## 2024-12-01 ENCOUNTER — Encounter: Payer: Self-pay | Admitting: Family Medicine

## 2024-12-01 NOTE — Telephone Encounter (Signed)
 Spoke with [facility] requesting to move up appt for virtual visit for [facility] home care - in speaking with her she mentioned that the patient had fired her current Psychiatrist.  Patient is now scheduled for  12/05/2024 - Monday for virtual visit with DR. Metheney to complete the face to face requirement for the psychiatric nurse . Fax # [phone] for [employer] with [facility] home care  -[phone] was told that  they do NOT offer medication management- states medications would have to be prescribed by a psychiatrist or Dr. Alvan but Dr. Alvan would have to be willing to sign off on this med list. They do have a psychiatric nurse who goes to patient home and helps with coping skills, who helps document if patient is taking medications and if the patient voices concerns with the mediations or relay symptoms/ concerns from patient to provider.   She states the face to face will need to include the patient list of psychiatric diagnoses , documenting which medications including  Psychiatric medications the patient is taking, documenting that patient is not doing well and needs home health.  She did state that [facility] home care would end and [facility] would be  needing new home health orders so any continuation of home health care needs that was previously ordered through [facility] could continue for the patient.   Patient informed that the appt is scheduled for Monday 12/05/2024 at 10:50 as virtual visit and she states she will start looking for a new psychiatrist tomorrow.  Dr. Alvan let me know what else I can do to help with this.

## 2024-12-02 ENCOUNTER — Telehealth: Payer: Self-pay

## 2024-12-02 NOTE — Telephone Encounter (Signed)
 Documented pneumonia vaccine given by home health into patient chart with estimated date.

## 2024-12-02 NOTE — Telephone Encounter (Signed)
 Addressed in a different encounter

## 2024-12-02 NOTE — Telephone Encounter (Signed)
 Called pt and informed her about the [employer] form. Advised her that she will need to call to get another form faxed to our office to be completed since the form that was returned was 39 months old. I gave her the phone number listed on the paperwork and she said that she would call to have this resent.

## 2024-12-05 ENCOUNTER — Encounter: Payer: Self-pay | Admitting: Family Medicine

## 2024-12-05 ENCOUNTER — Telehealth: Admitting: Family Medicine

## 2024-12-05 DIAGNOSIS — E1122 Type 2 diabetes mellitus with diabetic chronic kidney disease: Secondary | ICD-10-CM

## 2024-12-05 DIAGNOSIS — N183 Chronic kidney disease, stage 3 unspecified: Secondary | ICD-10-CM

## 2024-12-05 DIAGNOSIS — J019 Acute sinusitis, unspecified: Secondary | ICD-10-CM | POA: Diagnosis not present

## 2024-12-05 DIAGNOSIS — I10 Essential (primary) hypertension: Secondary | ICD-10-CM

## 2024-12-05 DIAGNOSIS — F332 Major depressive disorder, recurrent severe without psychotic features: Secondary | ICD-10-CM

## 2024-12-05 MED ORDER — PREDNISONE 10 MG PO TABS: 10.0000 mg | ORAL_TABLET | Freq: Every day | ORAL | 1 refills | Status: DC

## 2024-12-05 MED ORDER — LOSARTAN POTASSIUM 50 MG PO TABS: 50.0000 mg | ORAL_TABLET | Freq: Every day | ORAL | 0 refills | Status: DC

## 2024-12-05 NOTE — Telephone Encounter (Signed)
 Pended   Losartan  50mg 

## 2024-12-05 NOTE — Assessment & Plan Note (Signed)
 Severe recurrent episode-recent suicidal ideation right now she feels safe but she is just very emotional and tearful.  Working to see if we can get Maur[facility] crate to come out and provide behavioral health assistance with therapy nursing and possibly psychiatry consultation.  Did discuss also working on an external psychiatry referral as it may often times take months to get in just in case.  I will refill her medications until then.  She is currently on Viibryd  40 mg.  I do think she would benefit greatly from home behavioral health therapy.  She is currently homebound and is unable to actually even leave her apartment since she has stairs to maneuver and she cannot do that.

## 2024-12-05 NOTE — Telephone Encounter (Signed)
 Freya Bascom [place], [place]    12/02/24  9:41 AM Note Called pt and informed her about the [employer] form. Advised her that she will need to call to get another form faxed to our office to be completed since the form that was returned was 49 months old. I gave her the phone number listed on the paperwork and she said that she would call to have this resent.

## 2024-12-05 NOTE — Telephone Encounter (Signed)
 ok

## 2024-12-05 NOTE — Assessment & Plan Note (Signed)
 Lab Results  Component Value Date   CREATININE 1.37 (H) 12/31/2023   She is overdue for labs but again currently homebound.  Will try to repeat as soon as we can.

## 2024-12-05 NOTE — Assessment & Plan Note (Signed)
 I did recommend splitting losartan  in half and taking a half a tab daily.  If that works well I can send over the 50 mg dose or if we need to lower it even more than she can let me know if she still having low blood pressures

## 2024-12-05 NOTE — Progress Notes (Signed)
 Virtual Visit via Video Note  I connected with Krystal Burgess on 12/05/2024 at 10:50 AM EST by a video enabled telemedicine application and verified that I am speaking with the correct person using two identifiers.   I discussed the limitations of evaluation and management by telemedicine and the availability of in person appointments. The patient expressed understanding and agreed to proceed.  Patient location: at home Provider location: in office  Subjective:    CC:  No chief complaint on file.   HPI: She is here today for virtual follow-up-she is back in her apartment but is just been really struggling emotionally.  In fact she has been in the ED multiple times since I last saw her she went to the emergency department on November 20 for suicidal ideation and major depression.  Her medications had recently been adjusted and she felt like she was having some side effects.  She has been without family and living alone and this has been really stressful for her her daughter had blocked her after they had a significant argument and she had to move out of the house.  She went back to the emergency department again 2 days later.  She is trying to find a new mental health provider.  She also feels like she has another sinus infection that started about 4 days ago she is having left-sided facial pain and pressure with green nasal discharge no fevers or chills she still has some cefdinir  leftover and says she has enough to take it twice a day for 5 days.  She is still getting some home health care coming out and checking on her her blood pressures have been up and down so she has been holding her losartan  on days where her blood pressure is low and then taking it on days where it is high so she has not been taking it every day.  COPD follow-up-she really feels like the theophylline  is helpful in thinning her mucus she feels like it works better than Mucinex  and would like to continue to take it  we had removed from her medication list at 1 point but she does still have a bottle and she is taking it regularly.   Past medical history, Surgical history, Family history not pertinant except as noted below, Social history, Allergies, and medications have been entered into the medical record, reviewed, and corrections made.    Objective:    General: Speaking clearly in complete sentences without any shortness of breath.  Alert and oriented x3.  Normal judgment. No apparent acute distress.    Impression and Recommendations:    Problem List Items Addressed This Visit       Cardiovascular and Mediastinum   Essential hypertension   I did recommend splitting losartan  in half and taking a half a tab daily.  If that works well I can send over the 50 mg dose or if we need to lower it even more than she can let me know if she still having low blood pressures        Endocrine   Diabetes mellitus with stage 3 chronic kidney disease (HCC)   Lab Results  Component Value Date   CREATININE 1.37 (H) 12/31/2023   She is overdue for labs but again currently homebound.  Will try to repeat as soon as we can.        Other   MDD (major depressive disorder), recurrent episode, severe (HCC)   Severe recurrent episode-recent suicidal ideation right  now she feels safe but she is just very emotional and tearful.  Working to see if we can get [facility] crate to come out and provide behavioral health assistance with therapy nursing and possibly psychiatry consultation.  Did discuss also working on an external psychiatry referral as it may often times take months to get in just in case.  I will refill her medications until then.  She is currently on Viibryd  40 mg.  I do think she would benefit greatly from home behavioral health therapy.  She is currently homebound and is unable to actually even leave her apartment since she has stairs to maneuver and she cannot do that.      Other Visit Diagnoses        Acute non-recurrent sinusitis, unspecified location    -  Primary   Relevant Medications   predniSONE  (DELTASONE ) 10 MG tablet       Acute sinusitis - encouraged her to start the cefdinir  and will add low dose prednisone . Call if not improving after 5 days. Continue with saline rinses and nasal spray.   I do not currently prescribe her gabapentin  that comes from her pain management but we did discuss that she may want to talk to them about doing a lower dose twice a day instead of just a high dose once a day for better pain control.  No orders of the defined types were placed in this encounter.   Meds ordered this encounter  Medications   predniSONE  (DELTASONE ) 10 MG tablet    Sig: Take 1 tablet (10 mg total) by mouth daily with breakfast.    Dispense:  90 tablet    Refill:  1    I discussed the assessment and treatment plan with the patient. The patient was provided an opportunity to ask questions and all were answered. The patient agreed with the plan and demonstrated an understanding of the instructions.   The patient was advised to call back or seek an in-person evaluation if the symptoms worsen or if the condition fails to improve as anticipated.   Krystal Byars, MD

## 2024-12-05 NOTE — Telephone Encounter (Signed)
 Faxed face to face office notes to [facility] for referral completion to fax # [phone].

## 2024-12-06 ENCOUNTER — Encounter: Payer: Self-pay | Admitting: Family Medicine

## 2024-12-06 MED ORDER — THEOPHYLLINE ER 300 MG PO TB12: 300.0000 mg | ORAL_TABLET | Freq: Every day | ORAL | 0 refills | Status: AC

## 2024-12-06 NOTE — Addendum Note (Signed)
 Addended by: Jacayla Nordell D on: 12/06/2024 05:56 PM   Modules accepted: Orders

## 2024-12-07 ENCOUNTER — Telehealth: Payer: Self-pay

## 2024-12-07 MED ORDER — RABEPRAZOLE SODIUM 20 MG PO TBEC: 20.0000 mg | DELAYED_RELEASE_TABLET | Freq: Every day | ORAL | 1 refills | Status: DC

## 2024-12-07 NOTE — Telephone Encounter (Signed)
 Spoke with patient.   She  wanted to know if she could get help  getting a case cabin crew with [employer] in any way for assistance ? States they are  not allowing her to go into a rest home or assisted living as  as she can do for herself.  Her insurance does not cover assisted living and she can not afford this.   She also wanted to know if Dr. Alvan would reach out the [place] on her  behalf. (I told her that I did not think that she could legally do this) she states she did send Nicki a letter today telling her that she regretted everything that has happened and she wanted to come home.   Patient is extremely lonely and can not stand being alone any longer - states she is not suicidal but she feels  Stuck without know what can be done to help her- wanted to know if Dr. Alvan has any suggestions?

## 2024-12-07 NOTE — Telephone Encounter (Signed)
 Ok we can change to Aceiphex and see if works better than the pantoprazole .  Avoid soda, carbonated beverages, caffeine, greasy and spicey foods.  Avoid high fat foods.

## 2024-12-07 NOTE — Addendum Note (Signed)
 Addended by: Liandro Thelin D on: 12/07/2024 09:54 PM   Modules accepted: Orders

## 2024-12-07 NOTE — Telephone Encounter (Signed)
 Copied from CRM #[id number]. Topic: General - Other >> Dec 07, 2024  3:29 PM Santiya F wrote: Reason for CRM: Patient is calling in because she is requesting Luke give her a call back today. Patient says it is an urgent matter and she needs to speak with Luke immediately. Please follow up with patient.

## 2024-12-07 NOTE — Telephone Encounter (Signed)
 I don't have a way to ger her a case worker. She can call SS office and request an advocate or case worker.    I can't call her daughter.

## 2024-12-08 ENCOUNTER — Ambulatory Visit (HOSPITAL_COMMUNITY): Admitting: Licensed Clinical Social Worker

## 2024-12-08 ENCOUNTER — Telehealth: Payer: Self-pay | Admitting: *Deleted

## 2024-12-08 NOTE — Telephone Encounter (Signed)
 Spoke with patient informed of message from patient. She states she understands that her requests are out of our hands and not within our scope of practice.  She will contact her past chronic care management person to see if there is anything they can do to assist her and  She will contact her insurance company to see what assistance they may offer especially in regards to transportation and with assistance getting down the stairs to go to doctors appointments and such.

## 2024-12-08 NOTE — Progress Notes (Signed)
 Pt called requesting SW - very tearful, lonely, and requesting guidance. Original referral placed back in September from pcp at which time pt declined. Pt says pcp would place another referral if needed, I went ahead and scheduled with soonest available SW for phone appt 12/26 as pt was tearful and requesting help.

## 2024-12-08 NOTE — Telephone Encounter (Signed)
 I do not manage her back so this would have to come from the physician who does probably pain management I do not have notes and records to support the request with her insurance where is they would have very consistent notes of pain and discomfort etc. that would help cover it.

## 2024-12-09 ENCOUNTER — Encounter: Payer: Self-pay | Admitting: Family Medicine

## 2024-12-09 NOTE — Telephone Encounter (Signed)
 Last read by Marga W Genson [name] at 4:37PM on 12/08/2024.

## 2024-12-12 ENCOUNTER — Telehealth: Payer: Self-pay

## 2024-12-12 NOTE — Telephone Encounter (Signed)
 Copied from CRM #[id number]. Topic: General - Other >> Dec 12, 2024  4:29 PM Dominique A wrote: Reason for CRM: Hernando (Nurse) with [facility] is needing the patients medication list sent to him and also needing to know how much oxygen  the patient is suppose to be on.  Phone number:  [phone] (Can leave a message )  fax:  [phone]

## 2024-12-12 NOTE — Telephone Encounter (Unsigned)
 Copied from CRM #[id number]. Topic: Referral - Status >> Dec 12, 2024 11:30 AM Victoria B wrote: Reason for CRM: Patient called in , states can't come in to see ENT referral because she can't come down the states I her house and needs home health nurse referral

## 2024-12-12 NOTE — Telephone Encounter (Signed)
 Will fax over med list but I am not sure how much oxygen  the patein is on?

## 2024-12-12 NOTE — Telephone Encounter (Signed)
 Patient advised. Gave ok for [employer].

## 2024-12-12 NOTE — Telephone Encounter (Signed)
 Referral for pain clinic is still pending in encounter. Routing to clinic.

## 2024-12-12 NOTE — Telephone Encounter (Signed)
 That was ENT, they write for those treatments. I am happy to refer her to ENT but they will likely require a sinus culture.

## 2024-12-13 ENCOUNTER — Encounter: Payer: Self-pay | Admitting: Family Medicine

## 2024-12-13 ENCOUNTER — Telehealth: Payer: Self-pay

## 2024-12-13 ENCOUNTER — Ambulatory Visit: Payer: Self-pay

## 2024-12-13 NOTE — Telephone Encounter (Signed)
 Copied from CRM #[id number]. Topic: Clinical - Home Health Verbal Orders >> Dec 13, 2024  1:31 PM Tinnie BROCKS wrote: Caller/Agency: [facility] Callback Number: [phone] Service Requested: Physical Therapy Frequency: 1 week 2, 2 week 2, 1 week 1 Any new concerns about the patient? No

## 2024-12-13 NOTE — Telephone Encounter (Signed)
 She should be good through the 4th and I am here all week next week except one day

## 2024-12-13 NOTE — Telephone Encounter (Signed)
 FYI Only or Action Required?: Action required by provider: clinical question for provider and update on patient condition.  Patient was last seen in primary care on 12/05/2024 by Krystal Dorothyann BIRCH, MD.  Called Nurse Triage reporting Blood Pressure Check.  Symptoms began yesterday.  Interventions attempted: Prescription medications: losartan .  Symptoms are: stable.  Triage Disposition: See PCP Within 2 Weeks  Patient/caregiver understands and will follow disposition?: Yes    Copied from CRM [id number]. Topic: Clinical - Medical Advice >> Dec 13, 2024  3:46 PM Tobias L wrote: Reason for CRM: Patient inquiring if she should take her blood pressure medication, patient just took her blood pressure and it is at 130/93.  Seeking clinical advice, [phone]     Reason for Disposition  [1] Systolic BP >= 130 OR Diastolic >= 80 AND [2] taking BP medications  Answer Assessment - Initial Assessment Questions Returned pt's call regarding clinical advice. Discussed BP reading of 130/93 at time of previous call; pt took BP while speaking with NT. BP elevated at the beginning of call. Pt very tearful and anxious on the phone stating that she is fearful to be alone and does not have family support. Pt repetitively stated, I wish someone would call my daughter, I should not be alone. Talked to pt and allowed her to vent, pt states she lives on the second floor of her building and she is unable to go down stairs so she is very lonely. States that no one comes to visit her and she is unable to go out. Pt states d/t loneliness she stays sick; pt requested refill of Phenergan , states she takes it daily and only has 5 pills remaining at this time. Pt discussed BP medication and that she never knows when she should take it or hold rx. Discussed h/a and questioned if pt had any further symptoms of BP being elevated, pt denied. Pt fearful BP will bottom out if she takes rx at this time, discussed she can  hold rx if she is monitoring BP levels and not having any dizziness, changes in vision. Discussed I would send message to provider for parameters for when pt should hold rx, she voiced appreciation. Pt needing medication education. Discussed therapy, pt states she would love to see someone but she cannot leave her home. Reassured her I would get update to PCP. She voiced appreciation.     1. BLOOD PRESSURE: What is your blood pressure? Did you take at least two measurements 5 minutes apart?     345pm 130/93 L wrist monitor      520pm  L wrist 143/92     538pm L wrist 135/96  2. ONSET: When did you take your blood pressure?     Pt took prior to NT and while on the phone  3. HOW: How did you take your blood pressure? (e.g., automatic home BP monitor, visiting nurse)     Today's readings via wrist monitor; pt does report having Home health PT and RN that came yesterday and today. Reports BP was lower when staff checked it   4. HISTORY: Do you have a history of high blood pressure?     Pt reports hx of htn and hypotension; currently being tx for HTN; losartan  50mg  daily  5. MEDICINES: Are you taking any medicines for blood pressure? Have you missed any doses recently?     Losartan  50mg  once daily; pt has not taken rx today   6. OTHER SYMPTOMS: Do you have any symptoms? (  e.g., blurred vision, chest pain, difficulty breathing, headache, weakness)     H/a; pt reports she has had a h/a for a couple days d/t stress and anxiety  Protocols used: Blood Pressure - High-A-AH

## 2024-12-13 NOTE — Telephone Encounter (Signed)
 OK for PT

## 2024-12-16 ENCOUNTER — Other Ambulatory Visit: Payer: Self-pay | Admitting: Licensed Clinical Social Worker

## 2024-12-16 ENCOUNTER — Telehealth: Payer: Self-pay

## 2024-12-16 ENCOUNTER — Ambulatory Visit: Payer: Self-pay | Admitting: Family Medicine

## 2024-12-16 DIAGNOSIS — R197 Diarrhea, unspecified: Secondary | ICD-10-CM

## 2024-12-16 NOTE — Telephone Encounter (Signed)
" °  Spoke with patient and informed of why we needed the liters of Oxygen   She states she uses  2 to 3 liters of oxygen  with sitting and 4 to 5 liters of oxygen  when she gets up and moves around. States she just keeps the liters at 4 to 5 usually to avoid having to go to the oxygen  concentrator to change it every time she gets up.  "

## 2024-12-16 NOTE — Telephone Encounter (Signed)
 This task has been completed as requested. Travis @ [facility] provided with a verbal order for the home Physical Therapy. No other inquiries during the call. No further action needed.

## 2024-12-16 NOTE — Telephone Encounter (Signed)
 FYI Only or Action Required?: Action required by provider: medication refill request and update on patient condition.  Patient was last seen in primary care on 12/05/2024 by Alvan Dorothyann BIRCH, MD.  Called Nurse Triage reporting Nausea, Vomiting, Diarrhea, and Depression.  Symptoms began 1 year ago.  Interventions attempted: Prescription medications: phenergan , zofran , Rest, hydration, or home remedies, and Dietary changes.  Symptoms are: stable.  Triage Disposition: Call PCP Now (overriding See PCP When Office is Open (Within 3 Days))  Patient/caregiver understands and will follow disposition?: Yes                 Message from Victoria B sent at 12/16/2024  1:14 PM EST  Reason for Triage: patient has diarrhea and vomiting and feeling nervous in her stomach     Reason for Disposition  [1] MILD diarrhea (e.g., 1-3 or more stools than normal in past 24 hours) AND [2] present >  7 days  (Exception: Chronic diarrhea that is not worse.)  Answer Assessment - Initial Assessment Questions 1. DIARRHEA SEVERITY: How bad is the diarrhea? How many more stools have you had in the past 24 hours than normal?      Yes, one episode in the last 24 hours.   2. ONSET: When did the diarrhea begin?      Over a year ago.  3. STOOL DESCRIPTION:  How loose or watery is the diarrhea? What is the stool color? Is there any blood or mucous in the stool?     Very loose. No black tarry or blood. Dark brown color.  4. VOMITING: Are you also vomiting? If Yes, ask: How many times in the past 24 hours?      Yes, last episode was yesterday morning. She states it was like stomach acid and has not vomiting in the past 24 hours. She states it happens every other day.  5. ABDOMEN PAIN: Are you having any abdomen pain? If Yes, ask: What does it feel like? (e.g., crampy, dull, intermittent, constant)      She states her hiatal hernia was causing pain but Dr Alvan changed her  medication and it isn't causing pain anymore.  6. ABDOMEN PAIN SEVERITY: If present, ask: How bad is the pain?  (e.g., Scale 1-10; mild, moderate, or severe)     N/A.  7. ORAL INTAKE: If vomiting, Have you been able to drink liquids? How much liquids have you had in the past 24 hours?     Yes.  8. HYDRATION: Any signs of dehydration? (e.g., dry mouth [not just dry lips], too weak to stand, dizziness, new weight loss) When did you last urinate?     She states she was able to drink fluids and last urinated around noon.   9. EXPOSURE: Have you traveled to a foreign country recently? Have you been exposed to anyone with diarrhea? Could you have eaten any food that was spoiled?     No.  10. ANTIBIOTIC USE: Are you taking antibiotics now or have you taken antibiotics in the past 2 months?       No.  11. OTHER SYMPTOMS: Do you have any other symptoms? (e.g., fever, blood in stool)       No fever. Headaches and patient is tearful states she is feeling lonely due to her daughter evicting her and she is disabled, living alone.  Answer Assessment - Initial Assessment Questions 1. DRUG NAME: What medicine do you need to have refilled?     Zofran ; Diphenoxylate -atropine ; albuterol   inhaler  2. REFILLS REMAINING: How many refills are remaining? Notes: The label on the medicine or pill bottle will show how many refills are remaining. If there are no refills remaining, then a renewal may be needed.     0.  3. EXPIRATION DATE: What is the expiration date? Note: The label states when the prescription will expire, and thus can no longer be refilled.)     Unsure.  4. PRESCRIBER: Who prescribed it? Note: The prescribing doctor or group is responsible for refill approvals..     Dr Alvan  5. PHARMACY: Have you contacted your pharmacy (drugstore)? Note: Some pharmacies will contact the doctor (or NP/PA).      No.  6. SYMPTOMS: Do you have any symptoms?     See triage  assessment.  Protocols used: Diarrhea-A-AH, Medication Refill and Renewal Call-A-AH

## 2024-12-16 NOTE — Patient Outreach (Signed)
 Complex Care Management   Visit Note  12/16/2024  Name:  Krystal Burgess MRN: [id number] DOB: 06/24/1961  Situation: Referral received for Complex Care Management related to SDOH Barriers:  Social Isolation, Depression, Physicist, Medical Strain. I obtained verbal consent from Patient.  Visit completed with Patient  on the phone.  Background:   Past Medical History:  Diagnosis Date   Allergy    COPD (chronic obstructive pulmonary disease) (HCC)    Glaucoma    Hyperlipidemia    Hypertension    MRSA (methicillin resistant staph aureus) culture positive    Pain    Renal disorder    Thyroid  disease     Assessment: LCSW spoke with patient over the phone. Patient states that she isnt doing well. Patient shared that her daughter evicted her from her home. Patient states she lived in a hotel for a few weeks then moved into her current apartment. Patient wants to move back in daughters home so she can take care of her. Patient reports difficulty getting up and down stairs. Patient also requests assistance with cleaning her home and doing laundry. LCSW explained that patient will have to pay for that herself and patient stated she cannot afford it. Patient states she receives 1400 a month and does not have any extra money. Patient encouraged patient to fill out Medicaid application, and that if approved she MAY qualify for a personal care assistant. Patient also shares that she is socially isolated, LCSW and patient discussed community resources. Patient became tearful on the call, LCSW and patient discussed speaking with a therapist. Patient stated he will like that if it can be virtual. LCSW completed referral to [facility].   Patient Reported Symptoms:  Cognitive Cognitive Status: Alert and oriented to person, place, and time, Normal speech and language skills Cognitive/Intellectual Conditions Management [RPT]: None reported or documented in medical history or problem list   Health  Maintenance Behaviors: Annual physical exam Healing Pattern: Average Health Facilitated by: Stress management  Neurological Neurological Review of Symptoms: Other: Neurological Management Strategies: Medication therapy Neurological Self-Management Outcome: 3 (uncertain)  HEENT        Cardiovascular Cardiovascular Symptoms Reported: Chest pain or discomfort Does patient have uncontrolled Hypertension?: No Cardiovascular Management Strategies: Coping strategies, Medication therapy, Routine screening Cardiovascular Self-Management Outcome: 3 (uncertain)  Respiratory Respiratory Symptoms Reported: No symptoms reported, Wheezing Additional Respiratory Details: Pt uses oxygen  4-5 liters, also has a Bipap for at night Respiratory Management Strategies: BiPAP, Medication therapy, Oxygen  therapy Respiratory Self-Management Outcome: 3 (uncertain)  Endocrine Endocrine Symptoms Reported: No symptoms reported Is patient diabetic?: No (States pre diabetes) Endocrine Self-Management Outcome: 3 (uncertain)  Gastrointestinal Gastrointestinal Symptoms Reported: Diarrhea Additional Gastrointestinal Details: Takes medication      Genitourinary Genitourinary Symptoms Reported: No symptoms reported    Integumentary Integumentary Symptoms Reported: No symptoms reported    Musculoskeletal Musculoskelatal Symptoms Reviewed: Difficulty walking, Limited mobility, Unsteady gait Additional Musculoskeletal Details: Uses a walker, has dificulty walking up and down stairs Musculoskeletal Management Strategies: Medical device, Routine screening Musculoskeletal Self-Management Outcome: 3 (uncertain) Musculoskeletal Comment: Pt receiving HHPT      Psychosocial Psychosocial Symptoms Reported: Alteration in eating habits, Alteration in sleep habits, Depression - if selected complete PHQ 2-9 Additional Psychological Details: Patient reports saddness due to being lonely. Behavioral Management Strategies: Community  resources, Counseling (Counseling referral made.) Behavioral Health Self-Management Outcome: 3 (uncertain) Behavioral Health Comment: Counseling referral made Major Change/Loss/Stressor/Fears (CP): Relationship concerns (Strained family relationships) Behaviors When Feeling Stressed/Fearful: Strained relationship with daughter Techniques  to Cope with Loss/Stress/Change: Counseling, Withdraw Quality of Family Relationships: stressful, unable to assess Do you feel physically threatened by others?: No    12/16/2024    PHQ2-9 Depression Screening   Little interest or pleasure in doing things Nearly every day  Feeling down, depressed, or hopeless Nearly every day  PHQ-2 - Total Score 6  Trouble falling or staying asleep, or sleeping too much Nearly every day  Feeling tired or having little energy Nearly every day  Poor appetite or overeating  Nearly every day  Feeling bad about yourself - or that you are a failure or have let yourself or your family down More than half the days  Trouble concentrating on things, such as reading the newspaper or watching television More than half the days  Moving or speaking so slowly that other people could have noticed.  Or the opposite - being so fidgety or restless that you have been moving around a lot more than usual Not at all  Thoughts that you would be better off dead, or hurting yourself in some way Nearly every day  PHQ2-9 Total Score 22  If you checked off any problems, how difficult have these problems made it for you to do your work, take care of things at home, or get along with other people    Depression Interventions/Treatment      There were no vitals filed for this visit.    Medications Reviewed Today     Reviewed by Veva Bolt, LCSW (Social Worker) on 12/16/24 at 1045  Med List Status: <None>   Medication Order Taking? Sig Documenting Provider Last Dose Status Informant  albuterol  (VENTOLIN  HFA) 108 (90 Base) MCG/ACT inhaler  [id number]  Inhale 2 puffs into the lungs every 6 (six) hours as needed for wheezing or shortness of breath. Alvia Bring, DO  Active   ALPRAZolam  (XANAX ) 1 MG tablet [id number]  Take 0.5-1 tablets (0.5-1 mg total) by mouth 2 (two) times daily as needed for anxiety. Use sparingly Alvan Dorothyann BIRCH, MD  Active   SONJIA JESSE SCHLOSSMAN MEDICATION [id number]  Battery powered portable oxygen  concentrator Alvan Dorothyann BIRCH, MD  Active   SONJIA JESSE SCHLOSSMAN MEDICATION [id number]  Medication Name: Needs labs drawn: CBC w/ diff, CMP, TSH, A1C, B12, B1, B6, Magensium, folate, Iron panel, Lipid panel, urine microalbumin Alvan Dorothyann BIRCH, MD  Active   Blood Glucose Monitoring Suppl DEVI [id number]  1 each by Does not apply route daily as needed. Include strips and lancets to test once daily.  Dx. Diabetes. May substitute to any manufacturer covered by patient's insurance. Alvan Dorothyann BIRCH, MD  Active   buprenorphine ORVIS) 10 MCG/HR PTWK [id number]  Place 1 patch onto the skin once a week. [provider]  Active   buprenorphine (BUTRANS) 20 MCG/HR PTWK [id number]  Place 1 patch onto the skin once a week. [provider]  Active   fluticasone  (FLONASE ) 50 MCG/ACT nasal spray [id number]  SPRAY 2 SPRAYS INTO EACH NOSTRIL EVERY DAY Breeback, Jade L, PA-C  Active   gabapentin  (NEURONTIN ) 600 MG tablet [id number]  Take 600 mg by mouth 3 (three) times daily.  Patient taking differently: Take 600 mg by mouth at bedtime.   [provider]  Active   ipratropium-albuterol  (DUONEB) 0.5-2.5 (3) MG/3ML SOLN [id number]  INHALE 3 ML BY NEBULIZER EVERY 6 HOURS AS NEEDED Alvan Dorothyann BIRCH, MD  Active   levocetirizine (XYZAL ) 5 MG tablet [id number]  Take 1 tablet (5 mg total) by mouth every evening.  Alvan Dorothyann BIRCH, MD  Active   levothyroxine  (SYNTHROID ) 88 MCG tablet [id number]  Take 1 tablet (88 mcg total) by mouth daily. Alvan Dorothyann BIRCH, MD  Active   losartan  (COZAAR )  50 MG tablet [id number]  Take 1 tablet (50 mg total) by mouth daily. Alvan Dorothyann BIRCH, MD  Active   Magnesium  400 MG TABS [id number]  Take 1 tablet by mouth in the morning and at bedtime. Alvan Dorothyann BIRCH, MD  Active   ondansetron  (ZOFRAN -ODT) 4 MG disintegrating tablet [id number]  Take 1 tablet (4 mg total) by mouth every 8 (eight) hours as needed for nausea or vomiting. Alvan Dorothyann BIRCH, MD  Active   potassium chloride  (KLOR-CON  M) 10 MEQ tablet [id number]  Take 1 tablet (10 mEq total) by mouth 2 (two) times daily. Alvan Dorothyann BIRCH, MD  Active   predniSONE  (DELTASONE ) 10 MG tablet [id number]  Take 1 tablet (10 mg total) by mouth daily with breakfast. Alvan Dorothyann BIRCH, MD  Active   promethazine  (PHENERGAN ) 25 MG tablet [id number]  Take 1 tablet (25 mg total) by mouth every 6 (six) hours as needed for nausea or vomiting. Alvan Dorothyann BIRCH, MD  Active   RABEprazole  (ACIPHEX ) 20 MG tablet [id number]  Take 1 tablet (20 mg total) by mouth daily. Alvan Dorothyann BIRCH, MD  Active   rosuvastatin  (CRESTOR ) 20 MG tablet [id number]  Take 1 tablet (20 mg total) by mouth at bedtime. Alvan Dorothyann BIRCH, MD  Active   Sodium Chloride -Sodium Bicarb 1.57 g PACK [id number]  Nasal saline irrigation nightly. Alvan Dorothyann BIRCH, MD  Active   theophylline  (THEODUR) 300 MG 12 hr tablet [id number]  Take 1 tablet (300 mg total) by mouth at bedtime. Alvan Dorothyann BIRCH, MD  Active   TRELEGY ELLIPTA  100-62.5-25 MCG/ACT AEPB [id number]  INHALE 1 PUFF BY MOUTH EVERY DAY Alvan Dorothyann BIRCH, MD  Active   Vilazodone  HCl (VIIBRYD ) 40 MG TABS [id number]  Take 1 tablet (40 mg total) by mouth daily. Rankin, Luisa NOVAK, NP  Active             Recommendation:   PCP Follow-up Continue Current Plan of Care  Follow Up Plan:   Telephone follow up appointment date/time:  BSW[facility]ENWOOD Tillman BROCKS 12/20/24 at 10am and LCSW 01/03/25 at 10am.   Cena Ligas, LCSW Clinical Social Worker [facility]

## 2024-12-16 NOTE — Telephone Encounter (Signed)
 Spoke with patient -  explained the need for the liters of oxygen  . She voiced understanding.

## 2024-12-16 NOTE — Telephone Encounter (Signed)
 Copied from CRM #[id number]. Topic: Clinical - Medication Question >> Dec 16, 2024  2:51 PM Myrick T wrote: Reason for CRM: patient returning [facility] call about her oxygen . Patient said she was confused as she has a tank. Please f/u with patient

## 2024-12-16 NOTE — Patient Instructions (Signed)
 Visit Information  Thank you for taking time to visit with me today. Please don't hesitate to contact me if I can be of assistance to you before our next scheduled appointment.  Our next appointment is by telephone with BSWGLENWOOD Smoke C 12/20/24 at 10am and LCSW 01/03/25 at 10am.   Please call the care guide team at [phone] if you need to cancel or reschedule your appointment.   Following is a copy of your care plan:   Goals Addressed             This Visit's Progress    [facility] Social Work Care Plan LCSW       Problems:   Family and relationship dysfunction, Financial constraints related to limited income, Housing barriers, Limited social support, Mental Health Concerns , and Social Isolation  CSW Clinical Goal(s):   Over the next 90 days the Patient will explore community resource options for unmet needs related to Depression  , Financial Strain , and Social Connections as evidence by documented contact with at least one mental health support resource, one financial assistance or musician, and one social or community-based program, and by the patients ability to verbalize how each resource may support their needs..  Interventions:  Mental Health:  Evaluation of current treatment plan related to mental health concerns Active listening / Reflection utilized Consideration on in-home help encouraged : options discussed Depression screen reviewed Discussed referral options to connect for ongoing therapy: referral made to [facility] Emotional Support Provided PHQ2/PHQ9 completed Discussed patient wanting virtual therapy Discussed mobility issues when it comes to living in 2nd story apartment Discussed strained relationship with daughter Discussed lack of social support  Patient Goals/Self-Care Activities:  Call Department of Social Services [phone] to apply for medicaid. Collaborate with the community resource care guide for assistance with social isolation and  financial strain. Connect with provider for ongoing mental health treatment.   Continue taking your medication as prescribed.   Increase coping skills and healthy habits  Plan:   Telephone follow up appointment with care management team member scheduled for:  BSW- Smoke BROCKS 12/20/24 at 10am and LCSW 01/03/25 at 10am.         Please call the Suicide and Crisis Lifeline: 988 call the [place]  National Suicide Prevention Lifeline: 873-197-9185 or TTY: 248-764-5833 TTY 203-084-2041) to talk to a trained counselor call 1-800-273-TALK (toll free, 24 hour hotline) go to [facility] [address] [place] [phone]) call 911 if you are experiencing a Mental Health or Behavioral Health Crisis or need someone to talk to.  Patient verbalized understanding of Care plan and visit instructions communicated this visit  Cena Ligas, LCSW Clinical Social Worker [facility] [employer]

## 2024-12-18 ENCOUNTER — Other Ambulatory Visit: Payer: Self-pay | Admitting: Family Medicine

## 2024-12-18 DIAGNOSIS — E876 Hypokalemia: Secondary | ICD-10-CM

## 2024-12-19 ENCOUNTER — Encounter: Payer: Self-pay | Admitting: Family Medicine

## 2024-12-19 ENCOUNTER — Other Ambulatory Visit: Payer: Self-pay | Admitting: Family Medicine

## 2024-12-19 MED ORDER — ALBUTEROL SULFATE HFA 108 (90 BASE) MCG/ACT IN AERS: 2.0000 | INHALATION_SPRAY | Freq: Four times a day (QID) | RESPIRATORY_TRACT | 0 refills | Status: AC | PRN

## 2024-12-19 MED ORDER — ONDANSETRON 4 MG PO TBDP: 4.0000 mg | ORAL_TABLET | Freq: Three times a day (TID) | ORAL | 0 refills | Status: DC | PRN

## 2024-12-19 MED ORDER — DIPHENOXYLATE-ATROPINE 2.5-0.025 MG PO TABS: 1.0000 | ORAL_TABLET | Freq: Two times a day (BID) | ORAL | 0 refills | Status: DC | PRN

## 2024-12-19 NOTE — Telephone Encounter (Signed)
 Forwarded to the provider in a different encounter

## 2024-12-19 NOTE — Telephone Encounter (Signed)
 OK did we notify [facility]

## 2024-12-19 NOTE — Telephone Encounter (Signed)
 Spoke with Erla at [facility] - informed of 4L oxygen  and that patient med list is being faxed but fax has failed on previous attempts.   He states when he saw the patient she had some passive suicide ideation without a plan- mainly mentioned extreme depression and loneliness.  I informed him that we have already been made aware of this.  He states the child psychotherapist has gone out to see her and is trying to see what she can do to help . Trying to help with new psychiatric referral to be processed soon.   He state patient did mention a fall on Friday 12/26/225 - no redness, or bruising or any signs of fall noted per [facility] nurse.

## 2024-12-19 NOTE — Telephone Encounter (Signed)
 Meds ordered this encounter  Medications   albuterol  (VENTOLIN  HFA) 108 (90 Base) MCG/ACT inhaler    Sig: Inhale 2 puffs into the lungs every 6 (six) hours as needed for wheezing or shortness of breath.    Dispense:  8 g    Refill:  0   ondansetron  (ZOFRAN -ODT) 4 MG disintegrating tablet    Sig: Take 1 tablet (4 mg total) by mouth every 8 (eight) hours as needed for nausea or vomiting.    Dispense:  20 tablet    Refill:  0   diphenoxylate -atropine  (LOMOTIL ) 2.5-0.025 MG tablet    Sig: Take 1-2 tablets by mouth 2 (two) times daily as needed for diarrhea or loose stools.    Dispense:  30 tablet    Refill:  0

## 2024-12-19 NOTE — Telephone Encounter (Signed)
 Only need to hold Bp med if BP is under 110.

## 2024-12-19 NOTE — Telephone Encounter (Signed)
 Patient requesting rx rf of  Albuterol  HFA  Last written 04/06/2024 Zofran  4mg  ODT  Last written 11/24/2024 ( I think Jon has sent a separate message regarding the zofran  refill.)  Patient also requesting rx rf of  Diphoxylate-atropine  Last written 07/27/2024 Last OV 12/05/2024 telemedicine Upcoming appt 01/02/2025 ( video)

## 2024-12-20 ENCOUNTER — Telehealth: Payer: Self-pay

## 2024-12-20 MED ORDER — DIPHENOXYLATE-ATROPINE 2.5-0.025 MG PO TABS: 1.0000 | ORAL_TABLET | Freq: Two times a day (BID) | ORAL | 0 refills | Status: AC | PRN

## 2024-12-20 NOTE — Addendum Note (Signed)
 Addended by: Jhonatan Lomeli D on: 12/20/2024 12:52 PM   Modules accepted: Orders

## 2024-12-20 NOTE — Telephone Encounter (Signed)
 Copied from CRM [id number]. Topic: General - Other >> Dec 19, 2024  3:46 PM Shanda MATSU wrote: Reason for CRM: Edsel Molt (social worker) w/[facility] called in to adv that patient req their assistance with filling out psychiatric paperwork, after patient completed that particular paperwork she scored the highest score of 27 indicating severe depression. Caller also wanted to adv that patient expressed interest in assisted living but FL2 form is needed to be completed by patient's provider, caller is going to fax FL2 form to us , confirmed for caller that she has the correct fax #.

## 2024-12-20 NOTE — Telephone Encounter (Signed)
 Meds ordered this encounter  Medications   albuterol  (VENTOLIN  HFA) 108 (90 Base) MCG/ACT inhaler    Sig: Inhale 2 puffs into the lungs every 6 (six) hours as needed for wheezing or shortness of breath.    Dispense:  8 g    Refill:  0   ondansetron  (ZOFRAN -ODT) 4 MG disintegrating tablet    Sig: Take 1 tablet (4 mg total) by mouth every 8 (eight) hours as needed for nausea or vomiting.    Dispense:  20 tablet    Refill:  0   DISCONTD: diphenoxylate -atropine  (LOMOTIL ) 2.5-0.025 MG tablet    Sig: Take 1-2 tablets by mouth 2 (two) times daily as needed for diarrhea or loose stools.    Dispense:  30 tablet    Refill:  0   diphenoxylate -atropine  (LOMOTIL ) 2.5-0.025 MG tablet    Sig: Take 1-2 tablets by mouth 2 (two) times daily as needed for diarrhea or loose stools.    Dispense:  30 tablet    Refill:  0

## 2024-12-20 NOTE — Telephone Encounter (Signed)
 Fax confirmation received today 12/20/2024

## 2024-12-20 NOTE — Telephone Encounter (Signed)
 Copied from CRM #[id number]. Topic: Clinical - Prescription Issue >> Dec 19, 2024  3:28 PM Edsel HERO wrote:  Patient states that she needs a refill for her Zofran  for her upset stomach and nausea. Zofran  refill was denied. Please advise.

## 2024-12-20 NOTE — Telephone Encounter (Signed)
 Dr. Alvan sent refill of zofran  to pharmacy yesterday 12/19/2024

## 2024-12-21 ENCOUNTER — Telehealth (HOSPITAL_COMMUNITY): Admitting: Registered Nurse

## 2024-12-21 ENCOUNTER — Telehealth: Payer: Self-pay

## 2024-12-21 ENCOUNTER — Ambulatory Visit: Payer: Self-pay

## 2024-12-21 ENCOUNTER — Other Ambulatory Visit: Payer: Self-pay

## 2024-12-21 NOTE — Telephone Encounter (Signed)
 FYI Only or Action Required?: FYI only for provider: appointment scheduled on 01/27/2025.  Patient was last seen in primary care on 12/05/2024 by Alvan Dorothyann BIRCH, MD.  Called Nurse Triage reporting Nausea.  Symptoms began chronic.  Interventions attempted: Prescription medications: promethazine , ondansestron.  Symptoms are: unchanged.  Triage Disposition: See PCP Within 2 Weeks  Patient/caregiver understands and will follow disposition?: Yes  Copied from CRM #[id number]. Topic: Clinical - Red Word Triage >> Dec 21, 2024  1:22 PM Donna BRAVO wrote: Red Word that prompted transfer to Nurse Triage:  Patient instating speaking to speak with a nurse.  -scared when oxygen  drops -high anxiety  -very lonely  -very depressed -never lived alone and is very scared Reason for Disposition  Nausea is a chronic symptom (recurrent or ongoing AND present > 4 weeks)  Answer Assessment - Initial Assessment Questions 1. NAUSEA SEVERITY: How bad is the nausea? (e.g., mild, moderate, severe; dehydration, weight loss)     Moderate nausea at time of call  2. ONSET: When did the nausea begin?     Chronic, ongoing 3. VOMITING: Any vomiting? If Yes, ask: How many times today?     One time in the morning 4. RECURRENT SYMPTOM: Have you had nausea before? If Yes, ask: When was the last time? What happened that time?     Chronic, ongoing.  5. CAUSE: What do you think is causing the nausea?     Hernia  Patient prescribed both ondansetron  and promethazine .  Multiple questions about medication administration.  Cautioned not to take medication more frequently than prescribed Also asked about using pepto. Discouraged from using several medications.  Encouraged belly rest with transition to clear liquids, then soft bland foods  Protocols used: Nausea-A-AH

## 2024-12-21 NOTE — Patient Outreach (Signed)
 Social Drivers of Health  Community Resource and Care Coordination Visit Note   12/21/2024  Name: Krystal Burgess MRN: [id number] DOB:1961/10/05  Situation: Referral received for [facility] needs assessment and assistance related to Level of care. I obtained verbal consent from Patient.  Visit completed with Patient on the phone.   Background:   SDOH Interventions Today    Flowsheet Row Most Recent Value  SDOH Interventions   Housing Interventions Other (Comment)  [Pt lives on 2nd floor and is not able to exit the home.Pt is interested in Assisted Living/Group Home options.Pt may have an option to move in April to 1st floor if tenant moves out.]  Financial Strain Interventions Intervention Not Indicated     Assessment:   Goals Addressed             This Visit's Progress    BSW Goals       Current SDOH Barriers:  Limited social support Level of care concerns  Interventions: Patient interviewed and appropriate screenings performed Provided patient with information about Assisted Living/Group Home information Discussed plans with patient for ongoing follow up and provided patient with direct contact number Advised patient to contact 911 if suicidal Collaborated with [facility] Social Worker Edsel Molt (community agency) re: placement. Provided education on Advanced Directives Pt reports she is suicidal daily but does not have a plan to hurt herself or others.  Has not felt suicidal today but feels it may occur at some point today.  Pt declines desire to be hospitalized and admits she just wants her situation to improve and to not be alone.  Pt wants to return to stay with her daughter, but would accept group home as first preference. SW t/c SW Edsel Molt and left a message to collaborate with placement.  Pt will contact SW Jones to provide consent for conversation.  Pt does have assistant from a friend an sister in law but it is not regularly.  Pt is enrolled with the  [facility] for calls and on waiting list for visits.          Recommendation:   Patient will follow up with Social Worker with [facility] on placement.  Follow Up Plan:   Telephone follow up appointment date/time:  12/28/24 at 1pm  Tillman Gardener, BSW   [facility], [facility] Social Worker Direct Dial: [phone]  Fax: [phone] Website: [email or web address]

## 2024-12-21 NOTE — Telephone Encounter (Signed)
???  ° °  I don't know about that

## 2024-12-21 NOTE — Patient Instructions (Signed)
 Visit Information  Thank you for taking time to visit with me today. Please don't hesitate to contact me if I can be of assistance to you before our next scheduled appointment.  Our next appointment is by telephone on 01/07/25 at 1pm Please call the care guide team at [phone] if you need to cancel or reschedule your appointment.   Following is a copy of your care plan:   Goals Addressed             This Visit's Progress    BSW Goals       Current SDOH Barriers:  Limited social support Level of care concerns  Interventions: Patient interviewed and appropriate screenings performed Provided patient with information about Assisted Living/Group Home information Discussed plans with patient for ongoing follow up and provided patient with direct contact number Advised patient to contact 911 if suicidal Collaborated with [facility] Social Worker Edsel Molt (community agency) re: placement. Provided education on Advanced Directives Pt reports she is suicidal daily but does not have a plan to hurt herself or others.  Has not felt suicidal today but feels it may occur at some point today.  Pt declines desire to be hospitalized and admits she just wants her situation to improve and to not be alone.  Pt wants to return to stay with her daughter, but would accept group home as first preference. SW t/c SW Edsel Molt and left a message to collaborate with placement.  Pt will contact SW Jones to provide consent for conversation.  Pt does have assistant from a friend an sister in law but it is not regularly.  Pt is enrolled with the [facility] for calls and on waiting list for visits.          Please call 911 if you are experiencing a Mental Health or Behavioral Health Crisis or need someone to talk to.  Patient verbalized understanding of Care plan and visit instructions communicated this visit  Tillman Gardener, BSW [place]  [facility],  [facility] Social Worker Direct Dial: [phone]  Fax: [phone] Website: [email or web address]

## 2024-12-21 NOTE — Telephone Encounter (Signed)
 Patient called - requesting Dr. Alvan direct admit her to hospital due to vomiting  for the past 30 minutes without ceasing . I told her that this was not something that Dr. Alvan could do but that she should hang up and call 911 and have them come out to evaluate her. She is agreeable and will do this.

## 2024-12-21 NOTE — Telephone Encounter (Signed)
 Was advised to go to the emergency room for persistent vomiting.

## 2024-12-21 NOTE — Telephone Encounter (Signed)
 Spoke with patient  has questions  1-  can she take the zofran  4 mg any sooner than every 8 hours?  Or if she could be given the zofran  8mg  instead as the 4mg  is not lasting long enough. She has not taken Phenergen today as this would make her sleepy. 2- patient states she has gotten a new  oxygen  device and her oxygen  dropped last night to 88% - today it has not gotten any lower than 94%. She states that this drop in oxygen  level frightens her as she lives alone.  3-  she is also wanting to know what spacing should be given between Lomotil  being taken. States directions just read twice per day . She states she took one around 8:30 or 9am and just had loss of control of stool again .  FYI-  child psychotherapist  is trying to place her in assisted living or group home  and she thinks she will be eligible for [place] medicaid in the new year.  She will also call the [facility] and schedule the therapy appointment.

## 2024-12-27 ENCOUNTER — Telehealth: Admitting: Family Medicine

## 2024-12-27 ENCOUNTER — Encounter: Payer: Self-pay | Admitting: Family Medicine

## 2024-12-27 ENCOUNTER — Telehealth: Payer: Self-pay | Admitting: Family Medicine

## 2024-12-27 DIAGNOSIS — R112 Nausea with vomiting, unspecified: Secondary | ICD-10-CM

## 2024-12-27 DIAGNOSIS — N1832 Chronic kidney disease, stage 3b: Secondary | ICD-10-CM

## 2024-12-27 DIAGNOSIS — N183 Chronic kidney disease, stage 3 unspecified: Secondary | ICD-10-CM

## 2024-12-27 DIAGNOSIS — E876 Hypokalemia: Secondary | ICD-10-CM

## 2024-12-27 DIAGNOSIS — J9611 Chronic respiratory failure with hypoxia: Secondary | ICD-10-CM | POA: Diagnosis not present

## 2024-12-27 DIAGNOSIS — F332 Major depressive disorder, recurrent severe without psychotic features: Secondary | ICD-10-CM | POA: Diagnosis not present

## 2024-12-27 DIAGNOSIS — E1122 Type 2 diabetes mellitus with diabetic chronic kidney disease: Secondary | ICD-10-CM | POA: Diagnosis not present

## 2024-12-27 DIAGNOSIS — R197 Diarrhea, unspecified: Secondary | ICD-10-CM | POA: Diagnosis not present

## 2024-12-27 MED ORDER — GABAPENTIN 600 MG PO TABS: 600.0000 mg | ORAL_TABLET | Freq: Every day | ORAL | Status: AC

## 2024-12-27 MED ORDER — ONDANSETRON 4 MG PO TBDP: 4.0000 mg | ORAL_TABLET | Freq: Three times a day (TID) | ORAL | 0 refills | Status: AC | PRN

## 2024-12-27 NOTE — Telephone Encounter (Signed)
 HI Krystal Burgess can you let HIM know her A1C was abstracted in Sept but it is not showing that she had it done under Health maintenance.

## 2024-12-27 NOTE — Progress Notes (Signed)
 "   Virtual Visit via Video Note  I connected with Krystal Burgess on 12/27/2024 at  2:00 PM EST by a video enabled telemedicine application and verified that I am speaking with the correct person using two identifiers.   I discussed the limitations of evaluation and management by telemedicine and the availability of in person appointments. The patient expressed understanding and agreed to proceed.  Patient location: at home Provider location: in office  Subjective:    CC:  No chief complaint on file.   HPI:  She has a couple of concerns.   She has been feeling extra nauseated and having diarrhea and vomiting.  She says the zofran  and phenergan  are not working well. Says the zofran  works a little better and would like to have a higher strength is possible.  She has been on multiple rounds of antibiotics for sinus infections in the last several years.   Has held her magnesium  and potassium.  Says she has already been to the ED for the diarrhea.   Home health coming back out next week.   No fever or blood in her stool   Has PT and SW coming later this week on Thur.    She says she is even having diarrhea in the bed at night while sleeping.   Still waiting ot get moved to a first floor apt so she can leave her apt without have to call EMS  She has cut back on her gabapentin  from TID to at bedtime.    Anxiety - still using 2 tabs Xanax  daily. 1 at bedtime and quarter and takes 2nd on throughout the day.   Past medical history, Surgical history, Family history not pertinant except as noted below, Social history, Allergies, and medications have been entered into the medical record, reviewed, and corrections made.    Objective:    General: Speaking clearly in complete sentences without any shortness of breath.  Alert and oriented x3.  Normal judgment. No apparent acute distress.    Impression and Recommendations:    Problem List Items Addressed This Visit       Respiratory    Chronic respiratory failure with hypoxia (HCC)   Will need to get new form from [employer] to complete as oxygen  in critical.          Endocrine   Diabetes mellitus with stage 3 chronic kidney disease (HCC)   Relevant Orders   CMP14+EGFR   Hemoglobin A1c   Urine Microalbumin w/creat. ratio     Genitourinary   Stage 3b chronic kidney disease (CKD) (HCC)   Plan to recheck renal function on labs, will get [facility] to draw.         Other   MDD (major depressive disorder), recurrent episode, severe (HCC)   Reports she is taking her depression med regularly. Will need to wean off of xanax  since on pain meds.       Hypomagnesemia   Holding mag currently bc of diarrhea      Other Visit Diagnoses       Diarrhea, unspecified type    -  Primary   Relevant Orders   CMP14+EGFR   Hemoglobin A1c   Urine Microalbumin w/creat. ratio   GI Profile, Stool, PCR     Nausea and vomiting, unspecified vomiting type       Relevant Orders   CMP14+EGFR   Hemoglobin A1c   Urine Microalbumin w/creat. ratio     Hypokalemia  Relevant Orders   CMP14+EGFR       Orders Placed This Encounter  Procedures   CMP14+EGFR   Hemoglobin A1c   Urine Microalbumin w/creat. ratio   GI Profile, Stool, PCR    Meds ordered this encounter  Medications   gabapentin  (NEURONTIN ) 600 MG tablet    Sig: Take 1 tablet (600 mg total) by mouth at bedtime.   ondansetron  (ZOFRAN -ODT) 4 MG disintegrating tablet    Sig: Take 1 tablet (4 mg total) by mouth every 8 (eight) hours as needed for nausea or vomiting.    Dispense:  60 tablet    Refill:  0   Would like a mailed copy or her problem list. Also reminded her she can access in MyChart.   I discussed the assessment and treatment plan with the patient. The patient was provided an opportunity to ask questions and all were answered. The patient agreed with the plan and demonstrated an understanding of the instructions.   The patient was advised to call back or  seek an in-person evaluation if the symptoms worsen or if the condition fails to improve as anticipated.   [name], MD   "

## 2024-12-27 NOTE — Assessment & Plan Note (Signed)
 Holding mag currently bc of diarrhea

## 2024-12-27 NOTE — Progress Notes (Signed)
 Pt reports that she has been really nauseated and experiencing diarrhea. The medication is NOT helping. She has not been able to hardly keep water down or eat anything. She states that she is having diarrhea in her sleep.  She is also having some dizzy spells.

## 2024-12-27 NOTE — Assessment & Plan Note (Signed)
 Reports she is taking her depression med regularly. Will need to wean off of xanax  since on pain meds.

## 2024-12-27 NOTE — Assessment & Plan Note (Signed)
 Plan to recheck renal function on labs, will get [facility] to draw.

## 2024-12-27 NOTE — Telephone Encounter (Signed)
 Pt would like a printed list of problem list mailed to her.

## 2024-12-27 NOTE — Assessment & Plan Note (Signed)
 Will need to get new form from [employer] to complete as oxygen  in critical.

## 2024-12-28 ENCOUNTER — Other Ambulatory Visit: Payer: Self-pay

## 2024-12-28 NOTE — Patient Instructions (Signed)
 Visit Information  Thank you for taking time to visit with me today. Please don't hesitate to contact me if I can be of assistance to you before our next scheduled appointment.  Your next care management appointment is by telephone on 01/04/25 at 1pm  Please call the care guide team at [phone] if you need to cancel, schedule, or reschedule an appointment.   Please call 911 if you are experiencing a Mental Health or Behavioral Health Crisis or need someone to talk to.  Tillman Gardener, BSW [place]  [facility], [facility] Social Worker Direct Dial: [phone]  Fax: [phone] Website: [email or web address]

## 2024-12-28 NOTE — Telephone Encounter (Signed)
 Problem list printed and mailed to patient today

## 2024-12-28 NOTE — Patient Outreach (Signed)
 Social Drivers of Health  Community Resource and Care Coordination Visit Note   12/28/2024  Name: Krystal Burgess MRN: [id number] DOB:1961-10-26  Situation: Referral received for [facility] needs assessment and assistance related to level of care. I obtained verbal consent from Patient.  Visit completed with Patient on the phone.   Background:   SDOH Interventions Today    Flowsheet Row Most Recent Value  SDOH Interventions   Housing Interventions Other (Comment)  [Pt has requested DSS home visit to complete Special Assistance Medicaid for placement.  SW faxed FL-2 to provider. Pt will speak to Nurse that visits the home to complete TB test. SW with [facility] will assist with locating a facility.]     Assessment:   Goals Addressed             This Visit's Progress    BSW Goals       Current SDOH Barriers:  Limited social support Level of care concerns  Interventions: Patient interviewed and appropriate screenings performed Provided patient with information about Assisted Living/Group Home information Discussed plans with patient for ongoing follow up and provided patient with direct contact number Advised patient to contact 911 if suicidal Collaborated with [facility] Social Worker Krystal Burgess (community agency) re: placement. SW t/c SW Krystal Burgess and conference with patient to collaborate with placement.  SW Krystal Burgess will contact provider for FL-2, SW Burgess will visit 01/02/25 to review facilities.  Patient will communicate with Nurse during home visit to inquire about TB test.  SW will also notify provider regarding TB test. Patient contacted DSS to schedule a home visit to complete the Special Assistance Medicaid application. Patient will follow up with DSS by Friday if no reply.          Recommendation:   attend all scheduled provider appointments Patient will follow up with Nurse on TB test and DSS on Medicaid application.  Follow Up Plan:   Telephone  follow up appointment date/time:  01/04/25 at 1pm  Krystal Burgess, BSW [place]  [facility], [facility] Social Worker Direct Dial: [phone]  Fax: [phone] Website: [email or web address]

## 2024-12-29 NOTE — Telephone Encounter (Signed)
 See if she would be open to maybe talking with somebody we do have 2 therapist that are great that can do virtual visits and I think that could actually be really helpful for her if she is not already engaged with a therapist.  There are some great apps like calm as well that can help reduce anxiety..  There is 1 also specifically for panic attacks called Rootd  The nasal issues should really help to see a ENT this is an ongoing chronic issue for years.  Some not going to be able to help her on that issue

## 2024-12-30 NOTE — Patient Outreach (Signed)
 SW t/c Krystal Burgess, SW Krystal Burgess.  SW Krystal Burgess reports DSS has not contacted pt for home visit to complete the Medicaid application.  SW has received the FL-2 and will provide a copy to SW Krystal Burgess.  SW Krystal Burgess agreed to provide the FL-2 to patient to give to DSS during the home visit.  Patient has reported medical and behavior health concerns and was encouraged to go to the hospital but pt declined.    Tillman Gardener, BSW [place]  [facility], [facility] Social Worker Direct Dial: [phone]  Fax: [phone] Website: [email or web address]

## 2024-12-30 NOTE — Progress Notes (Signed)
 Form completed and placed in given to Angela

## 2024-12-30 NOTE — Progress Notes (Signed)
 Form faxed. Also sent to scan.

## 2024-12-31 ENCOUNTER — Other Ambulatory Visit: Payer: Self-pay | Admitting: Family Medicine

## 2025-01-01 NOTE — Progress Notes (Signed)
 [facility] Interim Discharge Summary  PCP: Krystal JONETTA Byars, MD Interim Discharge Details   Admit date:         12/30/2024 Discharge date:        01/01/2025  Hospital LOS:    1 days Code Status:   Full Code  Active Hospital Problems   Diagnosis Date Noted POA   *Suspected drug overdose 12/31/2024 Yes   Shock (*) 12/31/2024 Yes   Bradycardia 12/31/2024 Yes   Nausea vomiting and diarrhea 12/31/2024 Yes   Hypothermia 12/31/2024 Yes   Hyperlipidemia  Yes   Acute respiratory failure with hypoxia and hypercapnia (*) 04/08/2018 Yes   Anxiety and depression 04/08/2018 Yes   Chronic back pain 10/24/2017 Yes   Acute kidney injury superimposed on chronic kidney disease 10/23/2017 Yes   Essential hypertension 10/23/2017 Yes   Chronic anemia 10/23/2017 Yes   Chronic respiratory failure with hypoxia (*) 10/23/2017 Yes   Tobacco abuse 10/23/2017 Yes   Chronic insomnia 01/30/2017 Yes   Encephalopathy acute 10/12/2014 Yes   Type 2 diabetes mellitus without complications (*) 10/12/2014 Yes   Gastroesophageal reflux disease without esophagitis 10/12/2014 Yes   Diabetic peripheral neuropathy associated with type 2 diabetes mellitus (*) 10/12/2014 Yes   Hypothyroidism 10/08/2014 Yes   Morbid obesity (*) 10/08/2014 Yes   COPD (chronic obstructive pulmonary disease) (*) 10/08/2014 Yes   Vitamin B 12 deficiency 06/24/2011 Yes    Resolved Hospital Problems   Diagnosis Date Noted Date Resolved POA   Acute respiratory failure with hypercapnia (*) 10/09/2014 12/31/2024 Yes    Current infusions & medications:  dextrose  100 mL/hr (01/01/25 1417)   naloxone (NARCAN) 10 mg in NaCl 0.9 % 50 mL infusion 2 mg/hr (01/01/25 1341)   norepinephrine  Stopped (01/01/25 1050)    brimonidine  (ALPHAGAN ) 0.2%, timolol  maleate (TIMOPTIC ) 0.5% TI for COMBIGAN  0.2%/0.5%   Both Eyes BID   budesonide   0.5 mg Nebulization Q12H RSP   cefTRIAXone   2 g IntraVENous  Q24H   hydrocortisone sodium succinate  100 mg IntraVENous Q8H   ipratropium-albuterol   3 mL Nebulization Q6H   levothyroxine   50 mcg IntraVENous Daily 0600   pantoprazole   40 mg IntraVENous Daily  PRN medications: dextrose  Oral **OR** dextrose  IntraVENous **OR** dextrose  IntraVENous **OR** glucagon Intramuscular  Interval Hospital Course  Physicians involved in care during this hospitalization Attending Provider: Glendia KATHEE Saran, MD Attending Provider: Selinda Elsie Teresa Wenceslao, MD Admitting Provider: Selinda Elsie Teresa Wenceslao, MD  Indication for Admission: AMS, OD  HPI: Krystal Burgess is a 64 year old female with HTN, HLD, CAD, DM, CKD3, COPD, former smoker, OSA, home O2, chronic anemia, GERD, hypothyroidism, morbid obesity, hx MRSA bacteremia (Dec 2024-Jan 2025), anxiety with depression, hx SI, chronic insomnia, DDD, postlaminectomy syndrome and chronic pain prescribed Butrans who presented to the ED tonight via EMS with AMS, bradycardia, shock, hypoxia.    No one had heard from her in a couple of days so a neighbor called for a welfare check.  PD found her borderline unresponsive at the scene.  Upon EMS arrival, O2 sat was 75%, heart rate in the 40s and she was indeed altered.  They gave oxygen  via nasal cannula, 1mg  of atropine , 5mg  of epi, and some IV fluid.  Upon arrival to the ED was very lethargic, SBP in the 70s, heart rate of 50, temp via Foley 32F.  She was given 2 mg of Narcan and 2L of fluid.  BP improved a little.  Workup thus far shows compensated respiratory  acidosis and hypoxia, hypokalemia, AKI, hypomagnesemia, normal WBC, Hgb 11. Lactic acid, TSH, cortisol normal.  UDS +benzos and buprenorphine which she is prescribed. Ethanol, APAP, ASA undetectable. ECG shows sinus bradycardia w/ prolonged QT interval.  The ED staff found 4 Butrans (buprenorphine) patches on her and have removed these. CT head negative. CT CAP shows only acute findings of air-fluid levels in the colon  suggestive of diarrhea.   Krystal Burgess is currently obtunded. Awakens to pain and then will follow commands.  She apparently would not even wake up in follow commands upon arrival to the ED, so this constitutes an improvement. Does not answer questions.   Collateral information obtained from her sister.  She says that she last saw Krystal Burgess in person on Thursday (1/8) at which point she was alert and ambulatory within the home.  Krystal Burgess had been dealing with 2 weeks of nausea, vomiting, diarrhea and terrible heartburn, all of which made eating and drinking very difficult.  She says that her sister has chronic issues with pain and depression, and though no very recent reports of suicidal ideation, she did mention contemplating suicide approximately 2 months prior.  Unfortunately, we do not have bottles of medications from the scene or even an EMS run sheet.    Review PMP Aware (see under Media tab) and extensive med list with the assistance of the pharm tech. Pt is prescribed multiple sedating meds - Butrans, Xanax , hydroxyzine , phenergan , Lomotil .  And multiple meds which prolong QT interval - Butrans, Zofran , phenergan , theophylline  ER.   Krystal Burgess gets the majority of her care through the [facility] system.  Recently had a televisit with [facility] on 12/01/24 where she is followed for chronic pain due to lumbar radiculopathy, cervicalgia and postlaminectomy syndrome.  Her buprenorphine patch dose has recently been increased to 30 mcg/hr patch (necessitating 2 patches, a 10+20 mcg patch) and her pain was better on this dose.  She had a tele-medicine visit with her PCP on 12/27/24 for persistent N/V/D refractory to Zofran  and phenergan .  They discussed weaning her off Xanax  due to pain meds.     She was in the [facility] ED several times in November with similar issues including N/V/D, low Mg/K, and psych related concerns, though was A&O during those encounters. She was also seen for SOB and  facial cellulitis.  She noted that 11 mos prior, her daughter had kicked her out of the house and that she was living in an apartment on the 2nd floor which was difficult for her because of the stairs.   Interim Hospital Course:       1/10 First encounter. Difficult to arouse this am. O2 sats 99% on 4Lnc. STAT ABG. Start BiPAP via CCM protocol. Start Narcan gtt. Ongoing electrolyte replacement. Suicide precautions remain in place.   1/11 Remains somnolent although to less of a degree than yesterday.  ? Narcan gtt. Remains on low dose NE gtt. Cortisol < 20 --> start stress dose steroids. proBNP WNL. Renal indices remain ? with low UOP. LR bolus. Continue D10 for glycemic management. NGT placement for enteral access/nutrition. Stop D10 once TF initiated.  Current Plans: - continue Narcan gtt, titrate for mental status. Waking up more this afternoon - Continue Suicide precautions until cleared by Psych  - Consult Psych once mental status is more alert - NE gtt for MAP >/= 65 - NIV PRN - CM eval Monday; chart review with ? Restraining order against daughter.  Unclear home  situation.  - Poison control contacted on admission.  - Steroids/anbx - Continue D10 for hypoglycemia prevention -  Follow renal indices/ strict I&Os    Bedside Procedures     Critical Care    Electronically signed by: Krystal KATHEE Saran, MD on 12/31/24 1002 Status: Completed  Ordering user: Krystal KATHEE Saran, MD 12/31/24 1002 Ordering provider: Glendia KATHEE Saran, MD      Insert central line    Electronically signed by: Krystal KATHEE Saran, MD on 12/31/24 1000 Status: Completed  Ordering user: Krystal KATHEE Saran, MD 12/31/24 1000 Ordering provider: Glendia KATHEE Saran, MD

## 2025-01-01 NOTE — Progress Notes (Signed)
 [facility] Case Management Initial Assessment    Patient:                   Krystal Burgess MRN:                          [id number] Patient Date of Birth:       1961-07-31 Age/Sex:                   64 y.o./female  Assessment   Discussed role of CM and completed assessment with: Family Family: Telephonic Do you have a PCP and CM verified PCP?: Yes  High risk category: Chronic disease   Patient admitted from: Patient's Home-Alone   Functional status prior to admission: Requires occasional supervision and assistance Receives Help From/Support System: Other family member   Hydrologist in Place: Home Health Type of Home Health Service: Physical Therapy, Theatre Stage Manager Agency Name: [facility]  Home Living  Type of Home: Apartment Home Layout: Multi-level Bathroom Shower/Tub: Futures Trader: Sports Coach of Transportation: Support person  Licensed Conveyancer used at Home  Respiratory equipment used at home: BIPAP, Oxygen , Nebulizer  Social Drivers of Health  In the past 12 months, has lack of transportation kept you from medical appointments or from getting medications?: No In the past 12 months, has lack of transportation kept you from meetings, work, or from getting things needed for daily living?: No Income source: SSI/Pension/Retirement Any concerns managing your health?: No concerns  Assessment and Plan    Current Hospital Functional Status: Requires occassional supervision and assistance Anticipated Disposition: ALF/Assisted Living Facility Anticipated needs: Transportation  Per sister in law, Elijah Kos SW was assisting in finding an ALF.  Electronically signed: Marylynn JONELLE Coleridge, MSW 01/01/2025 2:44 PM

## 2025-01-02 ENCOUNTER — Ambulatory Visit: Admitting: Family Medicine

## 2025-01-03 NOTE — Consults (Signed)
 [facility] Psychiatry - Psychiatric Consultation Assessment  Date of Service: 01/03/2025 Referring Provider: Molinda KANDICE Packer, MD Patient Location at time of Consult: [facility] Provider Location at time of Consult: [facility]  Assessment   Krystal Burgess is a 64 y.o. female with a past medical history of HTN, HLD, CAD, DM, CKD3, COPD, former smoker, OSA, home O2, chronic anemia, GERD, hypothyroidism, morbid obesity, hx MRSA bacteremia (Dec 2024-Jan 2025), anxiety with depression, hx SI, chronic insomnia, DDD, postlaminectomy syndrome and chronic pain prescribed Butrans who presented to the ED tonight via EMS with AMS, bradycardia, shock, hypoxia. Krystal Burgess is admitted with Suspected drug overdose.   Psychiatry was consulted concerning self-harm and medication management.  Impression: 1.  Intentional overdose of Tramadol  and Buprenorphine patches 2.  MDD, severe, recurrent 3.  R/O Bipolar disorder 4. Tobacco use 5.  Prescription benzodiazepine dependence  Medical Decision-Making Capacity: Patient has capacity to make task-specific medical decisions. Patient does not have capacity to sign out against medical advice.    It is important to note that decision-making capacity is assessed for a specific decision at a specific moment in time.  The reason for this is different decisions have different risks and benefits. The threshold for an individual to display adequate decision-making capacity to provide consent to or refuse treatment should reflect the risks and benefits of that specific decision.  Furthermore, decision-making capacity is time sensitive meaning if an individual does not have the capacity to make a decision one day, this does not mean that they will not have the capacity to make the same medical decision at a later time.   Problem List: Principal Problem:   Suspected drug overdose Active  Problems:   Hypothyroidism   COPD (chronic obstructive pulmonary disease) (*)   Morbid obesity (*)   Encephalopathy acute   Type 2 diabetes mellitus without complications (*)   Gastroesophageal reflux disease without esophagitis   Diabetic peripheral neuropathy associated with type 2 diabetes mellitus (*)   Acute kidney injury superimposed on chronic kidney disease   Essential hypertension   Chronic anemia   Chronic respiratory failure with hypoxia (*)   Tobacco abuse   Chronic back pain   Acute respiratory failure with hypoxia and hypercapnia (*)   Anxiety and depression   Hyperlipidemia   Vitamin B 12 deficiency   Shock (*)   Chronic insomnia   Bradycardia   Nausea vomiting and diarrhea   Hypothermia   Treatment Plan & Recommendations   - Disposition Recommendations:  - we will follow on consult service with recommendations in treatment plan and voluntary psychiatric hospitalization recommended for risk of self injury - Commitment Status: voluntary.  Pt meets criteria for IVC if attempts to leave AMA - Precautions:  - fall and suicide - Pertinent Lab findings: - All labs obtained and reviewed from the current EMR through [facility] laboratories, unless otherwise stated. - Reviewed QTc 432, LFTs, CBC, Chem profile, UA, and UDS 01/03/2025  Recommendations: - Medications Resume home medication Xanax  at a lower dose 0.5 mg po q8h as needed-anxiety - Labs/Studies Defer to primary - Behavioral/Other Primary updated  Treatment plan focuses on modifiable risk factors and includes: level of care coordination and recommendations and risks/benefits/alternatives to treatment including common side effects and black box warnings on medication. I have discussed the case with Dr. Clorinda, who has assisted in the formulation of the assessment and plan.   All diagnostic procedures completed since  admission were reviewed.  Past medical records reviewed.  Coordination of care was  discussed with primary team.  Patient was informed and states understanding of potential risks and benefits of proposed treatment plan.  Thank you for allowing us  to take part in the care of Krystal Burgess.  If there are any urgent questions and concerns please out to provider on VOALTE or EPIC. If non - urgent requests or concerns please call Consult Line [phone]  Chief Complaint   Consult for self-harm and medication management  History of Present Illness on Initial Assessment    Krystal Burgess is a 64 y.o. female admitted with Suspected drug overdose.   Upon approach pt resting in bed, no apparent distress.  Resp even and and unlabored.  Oriented x 4.  Follows commands.  Awake and alert.  Patient reports that she intentionally tried to harm herself by taking Tramadol .  Patient also reports using 2 extra buprenorphine pain patches.  Patient states that she is lonely and severely depressed.  Patient states that her daughter kicked her out of the house and told her not to return.  Patient tearful.  Patient states she misses her daughter and her grandson.  Patient states that her mood has been declining over the last 2 months.  Patient states that she has had a plan to harm herself over the last 2 months.  Patient states that her friend talked her out of it.  Patient states she had to spend the holidays alone.  Patient's best friend Barnie at bedside and reports that she does not feel that patient is safe to return home alone at this time.  She states a year ago patient had pills lined up ready to ingest in a suicide attempt.  Patient states I am tired of hurting and being alone. Today patient denies suicidal ideations.  However she is unable to contract for safety once discharged from the hospital.  Pt states I need help.  I feel so alone.  Current suicidal/homicidal ideations:  Denies all Current auditory/visual hallucinations: Denies hallucinations and does not appear to  be responding to internal stimuli Current delusions: Denies. patient does not appear to hold any delusions Current paranoia:  Denies Sleep:  Poor Appetite: Poor Hygiene:  clean  Current withdrawal symptoms necessitating inpatient detox:  None   Review of systems   Depressive symptoms include: [] Denies Symptoms [x] pervasive sadness, [x] crying spells, [x] lost of interest, [x] low energy, [] feelings of guilt, [x] Appetite changes, [x] sleep disturbances, [x] isolative/withdrawn, [x] helpless/hopeless, [x] worthless, [] concentration difficulty, [] psychomotor retardation, [x] suicidal thoughts, [x] recurrent thoughts of death.   Anxiety symptoms include: [] Denies Symptoms [] excessive worries, [] nervousness, [] irritability, [x] feeling on edge, [] restless, [] sleep disturbances, [] muscle tension, [] social anxiety, [] panic attacks  Manic symptoms: [x] Denies Symptoms [] distractibility, [] flight of ideas, [] talkativeness, [] impulsivity, [] activity increase, [] grandiosity, [] sleep deficit   Psychosis symptoms include: [x] Denies Symptoms [] disorganized speech, [] disorganized behaviors, [] AH, [] TH, [] VH, [] thought blocking, [] ideas of reference, [] thought broadcasting, [] thought insertion, [] expansiveness, [] paranoia, [] hostile behavior   Substance abuse risk include: [] Denies all substance use [] excessive alcohol consumption, [] illegal drug use, [] IVDU, [x] tobacco use   A complete review of systems of the following systems was conducted (Constitutional, Psychiatric, Neurological, Musculoskeletal, Eyes, Gastrointestinal, Cardiovascular, Respiratory, Skin, and Endocrine). All reviewed systems are negative except pertinent positives documented/checked above or included in HPI.   Safety Assessment   Based on risk assessment and clinical exam, patient is at risk for: acute suicide or self-harm risk (High ). Individualized risk factors include: previous suicide attempts, chronic and persistent mental illness, access  to  means of self harm such as bridges or other high places, medications, sharps, ropes or other means of suffocation, hopeless, helpless, or worthless, and social isolation.  Individualized protective factors include: Patient has treatable psychiatric disorders and symptoms . Patient's strengths include: flexible personality features.Taking the aforementioned non-modifiable and modifiable risk factors in conjunction with her protective factors, the patient is currently felt to be of high imminent risk of harm to self or others. The patient does require inpatient psychiatric admission due to: and risk of self injury. To further mitigate risk, please see treatment recommendations above.   Interactive Complexity Service Note  [phone]  Today's encounter included additional complexity due to: maladaptive communication, including: repeated questioning  Past Psychiatric History   Previous diagnoses: anxiety, depression, Bipolar 1 Previous psychiatric medication trials: Viibryd , Xanax , BuSpar , Abilify, Prozac, Geodon , Paxil, Zoloft  Past suicidal/homicidal ideation/attempt: Plan to OD in the past Current/Past psychiatric provider: PCP Previous psychiatric hospitalizations/Rehab: [facility]  Past Medical History   Past medical history:I have reviewed and confirmed the past medical history in the chart. Medications: reviewed medication list in the chart Allergies: reviewed allergy section in the chart  Past Medical History:  Diagnosis Date   Allergic rhinitis 08/17/2009   Overview:  Qualifier: Diagnosis of  By: Alvan MD, Catherine    Anemia 10/12/2014   Anxiety and depression 04/08/2018   Asthma (*)    Bilateral carpal tunnel syndrome 03/14/2013   Bipolar 1 disorder (*) 04/08/2018   Chronic kidney disease    Chronic respiratory failure with hypoxia, on home O2 therapy (*)    CKD (chronic kidney disease) stage 3, GFR 30-59 ml/min (*) 10/26/2017   COPD (chronic obstructive pulmonary disease) (*)     Depression    Diabetes mellitus, type 2 (*)    Diabetic peripheral neuropathy associated with type 2 diabetes mellitus (*) 10/12/2014   Disc degeneration    Gastroesophageal reflux disease without esophagitis 10/12/2014   GERD (gastroesophageal reflux disease)    Glaucoma    Hyperlipidemia    Hypertension    Hypothyroidism    RLS (restless legs syndrome) 01/14/2019   Last Assessment & Plan:  Discussed options.  We can try ropinirole  and try to wean the gabapentin .  She will decrease her gabapentin  down to 600 mg at bedtime for about 4 to 5 days and then decrease down to half a tab.  For 4 to 5 days and then can try the ropinirole  instead.  He does have a follow-up with neurology in about a month and will discuss it with them as well.   Smoker    Spinal stenosis of lumbar region 12/12/2008   Overview:  Lumbar laminectomy L4-5, L5-S1, discectomy of L5-S1 performed by Dr. Elsie Daring at [facility] on chronic pain medication since then.   Teeth missing    Vitamin B 12 deficiency 06/24/2011   Overview:  ON injections Q2 weeks.     Substance Use History (Over the past 12 months)   Marijuana: Denies Cocaine: Denies Opiates: Denies Stimulants: As prescribed Benzodiazepine: As prescribed  Alcohol: Denies Other illicit drug usage: Denies  Patient denies all other substance use except for what is listed above.  The patient was not counseled on the dangers of substance use.   Readiness for substance/alcohol abuse treatment: Not applicable    Tobacco Use Screening and Recommendation  Current tobacco use? Yes: Type of tobacco used -: Vaping  The patient was counseled on the dangers of tobacco use. Spent 5 minutes on smoking  and tobacco use cessation counseling ([id number]). Practical counseling included: recognizing dangerous situations, development of coping skills, and basic info about quitting and link to health risks such as HTN, COPD, Cancer, heart attack,  and/or stroke, and advise to abstain.  Reviewed strategies to maximize success, including removing cigarettes and smoking materials from environment, stress management, substitution of other forms of reinforcement, support of family/friends, written materials, and pharmacotherapy (nicotine  patch/gum).  FDA-approved cessation medication offered/received: Nicotine  replacement therapy    Family History   Family history of suicide? Unknown  Family History[1]  Social History   I have reviewed and confirmed the social history in the chart.  Living Situation:  Lives alone Social Support:  Best friend.  Minimal family support   Social History[2]  Mental Status Evaluation    Constitutional:   General Appearance alertness:  awake and alert  General Behavior pleasant and cooperative and no abnormal motor behaviors  Musculoskeletal:   Gait and Station In bed duration of assessment   Strength and tone Not assessed   Psychiatric:   Psychomotor Activity No motor abnormalities  Speech  spontaneous, articulate, and normal in rate, tone, volume and amount and no disorders of speech production  Mood  consistent with speech content and neutral  Affect   flat  Thought Process linear, logical and goal oriented  Associations intact  Thought Content/Perceptual Disturbances denies S/HI, A/VH, delusions, IOR, paranoia, grandiosity, increased goal directed activity, preoccupations, obsessions, compulsions, or phobias  Cognition/Sensorium  alert and oriented x4, memory intact, attention span intact, and fund of knowledge intact  Insight  Poor  Judgment  Poor      Evaluation   Review Of Systems: A complete review of systems of the following systems was conducted 01/03/2025 (Constitutional, Psychiatric, Neurological, Musculoskeletal, Eyes, Gastrointestinal, Cardiovascular, Respiratory, Skin, and Endocrine). All reviewed systems are negative except pertinent positives identified in the HPI.  Vitals:   Vitals:   01/03/25 0900  BP: (!) 151/99  Pulse: 72  Resp:   Temp: 97.6 F (36.4 C)  SpO2: 96%    Medications:  brimonidine  (ALPHAGAN ) 0.2%, timolol  maleate (TIMOPTIC ) 0.5% TI for COMBIGAN  0.2%/0.5%   Both Eyes BID   budesonide   0.5 mg Nebulization Q12H RSP   heparin   5,000 Units Subcutaneous Q8H 928476   hydrocortisone sodium succinate  50 mg IntraVENous Q6H   ipratropium-albuterol   3 mL Nebulization Q6H   levothyroxine   50 mcg IntraVENous Daily 0600   pantoprazole   40 mg IntraVENous Daily   acetaminophen , dextrose  **OR** dextrose  **OR** dextrose  **OR** glucagon, ondansetron  Allergies: Allergies[3]  Labs: Recent Results (from the past 72 hours)  Blood Gas, Arterial on current oxygen  level   Collection Time: 12/31/24  9:52 AM  Result Value Ref Range   PH ARTERIAL 7.382 7.350 - 7.450   PCO2 46.5 (H) 35.0 - 45.0 mmHg   PO2 ARTERIAL 144.0 (H) 80.0 - 100.0 mmHg   HCO3 28 21 - 28 mmol/L   Base Excess 2 -2 - 2 mmol/L   OXYGEN  SATURATION MEASURED ART >100.0 %   PATIENT TEMPERATURE 98.6 Deg   VENTILATION MODE ARTERIAL Hubbell    LITER FLOW ARTERIAL 4 L/min   VENTILATION RATE 14    ALLENS TEST y    RESPIRATORY CARE NUMBER OF STICKS [facility] 2    SITE RR   POCT Glucose   Collection Time: 12/31/24 10:11 AM  Result Value Ref Range   Glucose, POC 191 (H) 70 - 99 mg/dL   OPERATOR ID [id number]    INSTRUMENT ID  [id number]   POCT Glucose   Collection Time: 12/31/24 11:28 AM  Result Value Ref Range   Glucose, POC 124 (H) 70 - 99 mg/dL   OPERATOR ID [id number]    INSTRUMENT ID [id number]   POCT Glucose   Collection Time: 12/31/24  3:29 PM  Result Value Ref Range   Glucose, POC 139 (H) 70 - 99 mg/dL   OPERATOR ID [id number]    INSTRUMENT ID [id number]   Blood Gas, Arterial on current oxygen  level   Collection Time: 12/31/24  3:36 PM  Result Value Ref Range   PH ARTERIAL 7.412 7.350 - 7.450   PCO2 43.1 35.0 - 45.0 mmHg   PO2 ARTERIAL 88.8 80.0 - 100.0 mmHg   HCO3 27 21 - 28  mmol/L   Base Excess 2 -2 - 2 mmol/L   OXYGEN  SATURATION MEASURED ART 98.1 %   PATIENT TEMPERATURE 98.6 Deg   VENTILATION MODE ARTERIAL Moffat    LITER FLOW ARTERIAL 2 L/min   TOTAL RATE ART 18    ALLENS TEST yes    RESPIRATORY CARE NUMBER OF STICKS [facility] 1    SITE RR   Basic Metabolic Panel   Collection Time: 12/31/24  3:43 PM  Result Value Ref Range   Na 142 136 - 146 mmol/L   Potassium 3.5 (L) 3.7 - 5.4 mmol/L   Cl 107 97 - 108 mmol/L   CO2 27 20 - 32 mmol/L   AGAP 8 7 - 16 mmol/L   Glucose 131 (H) 65 - 99 mg/dL   BUN 7 (L) 8 - 27 mg/dL   Creatinine 7.45 (H) 9.42 - 1.00 mg/dL   Ca 8.4 (L) 8.6 - 89.7 mg/dL   BUN/CREAT RATIO 2.8 (L) 11.0 - 26.0   eGFR 21 (L) >=60 mL/min/1.53m2  Echocardiogram Complete WO Enhancing Agent   Collection Time: 12/31/24  4:06 PM  Result Value Ref Range   LA M-L Dimension (A4C) 4.210 cm   LA S-I Dimension (A2C) 4.420 cm   LA Area Sys (A2C) 10.800 cm2   LA Area Sys (A4C) 14.100 cm2   LA ESV (A2C) 23.700 mL   LA ESV (A4C) 34.100 mL   LA A-P Dimension 2.800 cm   Left Atrium Dimension 2D 2.800 cm   LA ESV Index (A4C) 17.500 ml/m2   LA Volume Index (BP) 14.9 mL/m2   LA/Ao Ratio 1.080 no units   Aortic root 2.600 cm   Aortic annulus 2.600 cm   IVSd 1.0 0.7 - 1.2 cm   IVS 0.958 cm   LVIDd 4.620 4.72 - 6.55 cm   LVIDD 4.62 cm   LVIDs 2.690 2.78 - 4.21 cm   LVPWd 1.090 0.61 - 1.15 cm   LVPWD 1.090 cm   LVEDV 98.600 ml   LV Diastolic Volume (BP) 98.300 mL   Left Ventricular EF by 2-D Biplane by Method of Disks 80.200 %   Left Ventricular EF by Teichholz Method 72.700 %   LVES V 19.500 ml   LV Systolic Volume (BP) 26.800 mL   LVFS 41.800 %   Interventricular Septum/Left Ventricular PWD by 2D 0.879 no units   LV Stroke Volume 79.100 mL   LV Stroke Volume 71.500 mL   MV DT 431.000 ms   E wave deceleration time 431.000 msec   MV PHT 126.000 ms   MV PHT 126.000 ms   MV A Velocity 74.900 cm/s   MV Peak A Vel 0.749 m/s   MV E Velocity 60.800 cm/s  MV Peak E Vel 60.800 cm/s   MVA (PHT) 1.750 cm2   MV E/A 0.800 no units   TR Peak Velocity 1.990 m/s   TR Peak Gradient 16.000 mmHg   LV Mass 165.000 g   Patient Height 62    Patient Weight 3,347.46    Blood Pressure Monitoring 88/50    Pulse 59    Resp Care Set Rate Venous 13    Calculated BMI 38.3    BSA 2.04 m2   Pulse Ox 97    ZLVPWD 1.37    ZLVIDS -1.90    ZLVIDD -1.85    ZIVSD 0.54   Basic Metabolic Panel   Collection Time: 01/01/25 12:12 AM  Result Value Ref Range   Na 140 136 - 146 mmol/L   Potassium 3.2 (L) 3.7 - 5.4 mmol/L   Cl 105 97 - 108 mmol/L   CO2 26 20 - 32 mmol/L   AGAP 9 7 - 16 mmol/L   Glucose 141 (H) 65 - 99 mg/dL   BUN 8 8 - 27 mg/dL   Creatinine 7.29 (H) 9.42 - 1.00 mg/dL   Ca 8.4 (L) 8.6 - 89.7 mg/dL   BUN/CREAT RATIO 3.0 (L) 11.0 - 26.0   eGFR 20 (L) >=60 mL/min/1.51m2  CBC And Differential   Collection Time: 01/01/25 12:12 AM  Result Value Ref Range   WBC 10.5 4.0 - 10.5 thou/mcL   RBC 3.84 (L) 3.93 - 5.22 million/mcL   HGB 10.8 (L) 11.2 - 15.7 gm/dL   HCT 65.2 65.8 - 55.0 %   MCV 90.4 79.4 - 94.8 fL   MCH 28.1 25.6 - 32.2 pg   MCHC 31.1 (L) 32.2 - 35.5 gm/dL   Plt Ct 783 849 - 599 thou/mcL   RDW SD 47.9 (H) 35.1 - 46.3 fL   MPV 11.0 9.4 - 12.4 fL   NRBC% 0.0 0.0 - 0.2 /100WBC   Absolute NRBC Count 0.00 0.00 - 0.01 thou/mcL   NEUTROPHIL % 76.7 %   LYMPHOCYTE % 13.2 %   MONOCYTE % 8.9 %   Eosinophil % 0.4 %   BASOPHIL % 0.6 %   IG% 0.2 %   ABSOLUTE NEUTROPHIL COUNT 8.07 (H) 1.50 - 7.50 thou/mcL   ABSOLUTE LYMPHOCYTE COUNT 1.39 1.00 - 4.50 thou/mcL   Absolute Monocyte Count 0.94 (H) 0.10 - 0.80 thou/mcL   Absolute Eosinophil Count 0.04 0.00 - 0.50 thou/mcL   Absolute Basophil Count 0.06 0.00 - 0.20 thou/mcL   Absolute Immature Granulocyte Count 0.02 0.00 - 0.03 thou/mcL   Magnesium    Collection Time: 01/01/25 12:12 AM  Result Value Ref Range   Mg 2.0 1.6 - 2.6 mg/dL  Phosphorus   Collection Time: 01/01/25 12:12 AM  Result Value  Ref Range   Phos 2.7 2.5 - 4.5 mg/dL  NT-proBNP   Collection Time: 01/01/25 12:12 AM  Result Value Ref Range   NT-ProBNP 309 <=899 pg/mL  Cortisol   Collection Time: 01/01/25 12:12 AM  Result Value Ref Range   Cortisol 13.30 2.30 - 19.40 mcg/dL  POCT Glucose   Collection Time: 01/01/25  7:49 AM  Result Value Ref Range   Glucose, POC 131 (H) 70 - 99 mg/dL   OPERATOR ID [id number]    INSTRUMENT ID [id number]   POCT Glucose   Collection Time: 01/01/25 12:18 PM  Result Value Ref Range   Glucose, POC 97 70 - 99 mg/dL   OPERATOR ID [id number]    INSTRUMENT ID [id number]   POCT Glucose  Collection Time: 01/01/25  4:02 PM  Result Value Ref Range   Glucose, POC 154 (H) 70 - 99 mg/dL   OPERATOR ID [id number]    INSTRUMENT ID [id number]   ECG 12 lead   Collection Time: 01/01/25  5:27 PM  Result Value Ref Range   Acquisition Device D3K    Systolic BP 122 mmHg   Diastolic BP 67 mmHg   Ventricular Rate 61 BPM   Atrial Rate 61 BPM   P-R Interval 194 ms   QRS Duration 88 ms   Q-T Interval 430 ms   QTC Calculation(Bazett) 432 ms   Calculated P Axis 68 degrees   Calculated R Axis -17 degrees   Calculated T Axis 33 degrees   ECG Diagnosis      Normal sinus rhythm Low voltage QRS Cannot rule out Anterior infarct , age undetermined Abnormal ECG When compared with ECG of 31-Dec-2024 03:45, Previous ECG has undetermined rhythm, needs review QT has shortened Noureddine, Nizar ([id number]) on 01/01/2025 6:48:31 PM certifies that he/she has reviewed the ECG tracing and confirms the independent interpretation is correct.   POCT Glucose   Collection Time: 01/01/25  8:24 PM  Result Value Ref Range   Glucose, POC 120 (H) 70 - 99 mg/dL   OPERATOR ID [id number]    INSTRUMENT ID [id number]   CBC And Differential   Collection Time: 01/02/25  1:46 AM  Result Value Ref Range   WBC 8.9 4.0 - 10.5 thou/mcL   RBC 3.68 (L) 3.93 - 5.22 million/mcL   HGB 10.6 (L) 11.2 - 15.7 gm/dL   HCT 67.4 (L) 65.8  - 44.9 %   MCV 88.3 79.4 - 94.8 fL   MCH 28.8 25.6 - 32.2 pg   MCHC 32.6 32.2 - 35.5 gm/dL   Plt Ct 828 849 - 599 thou/mcL   RDW SD 45.2 35.1 - 46.3 fL   MPV 11.2 9.4 - 12.4 fL   NRBC% 0.0 0.0 - 0.2 /100WBC   Absolute NRBC Count 0.00 0.00 - 0.01 thou/mcL   NEUTROPHIL % 88.3 %   LYMPHOCYTE % 8.4 %   MONOCYTE % 2.7 %   Eosinophil % 0.0 %   BASOPHIL % 0.2 %   IG% 0.4 %   ABSOLUTE NEUTROPHIL COUNT 7.87 (H) 1.50 - 7.50 thou/mcL   ABSOLUTE LYMPHOCYTE COUNT 0.75 (L) 1.00 - 4.50 thou/mcL   Absolute Monocyte Count 0.24 0.10 - 0.80 thou/mcL   Absolute Eosinophil Count 0.00 0.00 - 0.50 thou/mcL   Absolute Basophil Count 0.02 0.00 - 0.20 thou/mcL   Absolute Immature Granulocyte Count 0.04 (H) 0.00 - 0.03 thou/mcL   Basic Metabolic Panel   Collection Time: 01/02/25  1:46 AM  Result Value Ref Range   Na 135 (L) 136 - 146 mmol/L   Potassium 4.0 3.7 - 5.4 mmol/L   Cl 101 97 - 108 mmol/L   CO2 23 20 - 32 mmol/L   AGAP 11 7 - 16 mmol/L   Glucose 143 (H) 65 - 99 mg/dL   BUN 12 8 - 27 mg/dL   Creatinine 6.32 (H) 9.42 - 1.00 mg/dL   Ca 8.9 8.6 - 89.7 mg/dL   BUN/CREAT RATIO 3.3 (L) 11.0 - 26.0   eGFR 13 (L) >=60 mL/min/1.63m2  Magnesium    Collection Time: 01/02/25  1:46 AM  Result Value Ref Range   Mg 1.8 1.6 - 2.6 mg/dL  Phosphorus   Collection Time: 01/02/25  1:46 AM  Result Value Ref Range   Phos 2.5 2.5 -  4.5 mg/dL  CK   Collection Time: 01/02/25  1:46 AM  Result Value Ref Range   CK 614 (H) 24 - 173 U/L  POCT Glucose   Collection Time: 01/02/25  1:48 AM  Result Value Ref Range   Glucose, POC 153 (H) 70 - 99 mg/dL   OPERATOR ID [id number]    INSTRUMENT ID [id number]   POCT Glucose   Collection Time: 01/02/25  9:09 AM  Result Value Ref Range   Glucose, POC 169 (H) 70 - 99 mg/dL   OPERATOR ID [id number]    INSTRUMENT ID [id number]   POCT Glucose   Collection Time: 01/02/25 11:13 AM  Result Value Ref Range   Glucose, POC 164 (H) 70 - 99 mg/dL   OPERATOR ID [id number]     INSTRUMENT ID [id number]   Basic Metabolic Panel   Collection Time: 01/02/25  1:02 PM  Result Value Ref Range   Na 130 (L) 136 - 146 mmol/L   Potassium 3.9 3.7 - 5.4 mmol/L   Cl 99 97 - 108 mmol/L   CO2 21 20 - 32 mmol/L   AGAP 10 7 - 16 mmol/L   Glucose 148 (H) 65 - 99 mg/dL   BUN 14 8 - 27 mg/dL   Creatinine 5.59 (H) 9.42 - 1.00 mg/dL   Ca 8.8 8.6 - 89.7 mg/dL   BUN/CREAT RATIO 3.2 (L) 11.0 - 26.0   eGFR 11 (L) >=60 mL/min/1.70m2  POCT Glucose   Collection Time: 01/02/25  3:27 PM  Result Value Ref Range   Glucose, POC 173 (H) 70 - 99 mg/dL   OPERATOR ID [id number]    INSTRUMENT ID [id number]   Basic Metabolic Panel   Collection Time: 01/02/25  9:03 PM  Result Value Ref Range   Na 130 (L) 136 - 146 mmol/L   Potassium 4.0 3.7 - 5.4 mmol/L   Cl 100 97 - 108 mmol/L   CO2 22 20 - 32 mmol/L   AGAP 8 7 - 16 mmol/L   Glucose 98 65 - 99 mg/dL   BUN 16 8 - 27 mg/dL   Creatinine 5.40 (H) 9.42 - 1.00 mg/dL   Ca 9.0 8.6 - 89.7 mg/dL   BUN/CREAT RATIO 3.5 (L) 11.0 - 26.0   eGFR 10 (L) >=60 mL/min/1.35m2  Magnesium    Collection Time: 01/03/25  3:19 AM  Result Value Ref Range   Mg 2.1 1.6 - 2.6 mg/dL  Phosphorus   Collection Time: 01/03/25  3:19 AM  Result Value Ref Range   Phos 3.5 2.5 - 4.5 mg/dL  Basic Metabolic Panel   Collection Time: 01/03/25  3:19 AM  Result Value Ref Range   Na 130 (L) 136 - 146 mmol/L   Potassium 3.9 3.7 - 5.4 mmol/L   Cl 99 97 - 108 mmol/L   CO2 22 20 - 32 mmol/L   AGAP 9 7 - 16 mmol/L   Glucose 86 65 - 99 mg/dL   BUN 18 8 - 27 mg/dL   Creatinine 4.99 (H) 9.42 - 1.00 mg/dL   Ca 8.8 8.6 - 89.7 mg/dL   BUN/CREAT RATIO 3.6 (L) 11.0 - 26.0   eGFR 9 (L) >=60 mL/min/1.75m2  CBC And Differential   Collection Time: 01/03/25  3:19 AM  Result Value Ref Range   WBC 10.8 (H) 4.0 - 10.5 thou/mcL   RBC 3.42 (L) 3.93 - 5.22 million/mcL   HGB 10.0 (L) 11.2 - 15.7 gm/dL   HCT 69.7 (L) 65.8 - 55.0 %  MCV 88.3 79.4 - 94.8 fL   MCH 29.2 25.6 - 32.2 pg    MCHC 33.1 32.2 - 35.5 gm/dL   Plt Ct 774 849 - 599 thou/mcL   RDW SD 45.2 35.1 - 46.3 fL   MPV 10.9 9.4 - 12.4 fL   NRBC% 0.0 0.0 - 0.2 /100WBC   Absolute NRBC Count 0.00 0.00 - 0.01 thou/mcL   NEUTROPHIL % 81.2 %   LYMPHOCYTE % 11.6 %   MONOCYTE % 6.6 %   Eosinophil % 0.0 %   BASOPHIL % 0.2 %   IG% 0.4 %   ABSOLUTE NEUTROPHIL COUNT 8.79 (H) 1.50 - 7.50 thou/mcL   ABSOLUTE LYMPHOCYTE COUNT 1.26 1.00 - 4.50 thou/mcL   Absolute Monocyte Count 0.72 0.10 - 0.80 thou/mcL   Absolute Eosinophil Count 0.00 0.00 - 0.50 thou/mcL   Absolute Basophil Count 0.02 0.00 - 0.20 thou/mcL   Absolute Immature Granulocyte Count 0.04 (H) 0.00 - 0.03 thou/mcL     Imaging Results: EEG Awake and Drowsy Result Date: 12/31/2024 Table formatting from the original result was not included. Images from the original result were not included. ELECTROENCEPHALOGRAPHY Date of Service : December 31, 2024 EEG Duration : 22 minutes and 04 seconds Referring Provider : Exline    History : Krystal Burgess is a 64 y.o. female referred for AMS. EEG ordered to look for evidence of seizures and/or interictal discharges. Current Medications : MAR reviewed     Procedure : This is a routine 21 channel adult electroencephalogram with one channel dedicated to limited EKG recording. Various montages were used in the acquisition of the EEG. Activating procedures included photic stimulation, but hyperventilation was not performed. State : This study was obtained with the patient in the awake and asleep state.     EEG Description :  The waking background was seen briefly and consisted of low amplitude alpha with some intermixed theta that was poorly organized, continuous, symmetric, and reactive. There was no discernable posterior dominant rhythm, but there was good variability.  Photic stimulation did not elicit driving response. Hyperventilation was not performed. Drowsiness was evidenced by alpha dropout and decreased muscle  artifact.   Stage N2 sleep was evidenced by sleep spindles. There were no focal abnormalities, epileptiform abnormalities, or electrographic seizures.     EEG Interpretation : This is an abnormal awake and asleep EEG due to generalized slowing of the background. No seizures. Clinical Correlation : Findings indicate: Mild degree of encephalopathy (diffuse cerebral dysfunction) of nonspecific etiology. Medford Blumenthal, MD 12/31/2024, 2:25 PM   XR Chest Ap Portable Result Date: 01/01/2025 INDICATION: Shortness of breath COMPARISON: 12/30/2024 TECHNIQUE: Single AP view of the chest. FINDINGS: Stable mediastinal and hilar contours. Right IJ central line is noted. No evidence of acute infiltrate, pleural fluid, or pneumothorax.   IMPRESSION: 1. No evidence of acute intrathoracic disease. Electronically Signed by: Charlie Redfern MD on 01/01/2025 8:17 AM  Echocardiogram Complete WO Enhancing Agent Result Date: 12/31/2024   Left Ventricle: Systolic function is normal. EF: 55-60%. Wall motion is normal.    Results for orders placed or performed during the hospital encounter of 12/30/24  ECG 12 lead  Result Value Ref Range   Acquisition Device D3K    Ventricular Rate 61 BPM   Atrial Rate 61 BPM   P-R Interval 144 ms   QRS Duration 84 ms   Q-T Interval 562 ms   QTC Calculation(Bazett) 565 ms   Calculated P Axis 99 degrees   Calculated R  Axis -36 degrees   Calculated T Axis 66 degrees   ECG Diagnosis      Sinus rhythm with premature atrial complexes Left axis deviation Nonspecific ST abnormality Prolonged QT Abnormal ECG When compared with ECG of 12-Nov-2024 12:42, premature atrial complexes are now present QT has lengthened  no stemi sbk [id number] Kribbs, Scott ([id number]) on 12/31/2024 6:49:34 AM certifies that he/she has reviewed the ECG tracing and confirms the independent interpretation is correct.   ECG 12 lead  Result Value Ref Range   Acquisition Device D3K    Systolic BP 91 mmHg   Diastolic BP  53 mmHg   Ventricular Rate 45 BPM   Atrial Rate 45 BPM   P-R Interval 168 ms   QRS Duration 80 ms   Q-T Interval 560 ms   QTC Calculation(Bazett) 484 ms   Calculated P Axis 64 degrees   Calculated R Axis -23 degrees   Calculated T Axis 30 degrees   ECG Diagnosis      Sinus bradycardia Low voltage QRS Borderline ECG When compared with ECG of 30-Dec-2024 22:31, premature atrial complexes are no longer present QT has shortened Kribbs, Scott ([id number]) on 12/31/2024 6:51:12 AM certifies that he/she has reviewed the ECG tracing and confirms the independent interpretation is correct.   ECG 12 lead  Result Value Ref Range   Acquisition Device D3K    Systolic BP 143 mmHg   Diastolic BP 70 mmHg   Ventricular Rate 43 BPM   Atrial Rate 54 BPM   QRS Duration 90 ms   Q-T Interval 630 ms   QTC Calculation(Bazett) 532 ms   Calculated P Axis 60 degrees   Calculated R Axis -33 degrees   Calculated T Axis 64 degrees   ECG Diagnosis      possible complete heart block Left axis deviation Low voltage QRS Prolonged QT Abnormal ECG  Kribbs, Scott ([id number]) on 12/31/2024 6:58:55 AM certifies that he/she has reviewed the ECG tracing and confirms the independent interpretation is correct.   ECG 12-Lead  Result Value Ref Range   Acquisition Device D3K    Systolic BP 143 mmHg   Diastolic BP 70 mmHg   Ventricular Rate 43 BPM   Atrial Rate 54 BPM   QRS Duration 90 ms   Q-T Interval 630 ms   QTC Calculation(Bazett) 532 ms   Calculated P Axis 60 degrees   Calculated R Axis -33 degrees   Calculated T Axis 64 degrees   ECG Diagnosis      Sinus bradycardia with 2nd degree AV block Left axis deviation Low voltage QRS Prolonged QT Cannot exclude inferior infarct, age undetermined Merilee Anes ([id number]) on 01/03/2025 7:05:45 AM certifies that he/she has reviewed the ECG tracing and confirms the independent interpretation is correct.   ECG 12 lead  Result Value Ref Range   Acquisition Device D3K     Systolic BP 122 mmHg   Diastolic BP 67 mmHg   Ventricular Rate 61 BPM   Atrial Rate 61 BPM   P-R Interval 194 ms   QRS Duration 88 ms   Q-T Interval 430 ms   QTC Calculation(Bazett) 432 ms   Calculated P Axis 68 degrees   Calculated R Axis -17 degrees   Calculated T Axis 33 degrees   ECG Diagnosis      Normal sinus rhythm Low voltage QRS Cannot rule out Anterior infarct , age undetermined Abnormal ECG When compared with ECG of 31-Dec-2024 03:45, Previous ECG has undetermined rhythm, needs review QT  has shortened Noureddine, Nizar ([id number]) on 01/01/2025 6:48:31 PM certifies that he/she has reviewed the ECG tracing and confirms the independent interpretation is correct.     Anticonvulsant Drug Levels: No results found for this or any previous visit (from the past 72 weeks).   Electronically signed by: Dewitt JINNY All, NP 01/03/2025 9:34 AM         [1] Family History Problem Relation Name Age of Onset   Asthma Father     COPD Father      Mother     Cancer Brother      Mother     Heart failure Father    [2] Social History Socioeconomic History   Marital status: Divorced  Tobacco Use   Smoking status: Every Day    Current packs/day: 2.00    Types: Cigarettes   Smokeless tobacco: Never  Vaping Use   Vaping status: Never Used  Substance and Sexual Activity   Alcohol use: No   Drug use: No  [3] Allergies Allergen Reactions   Drug Class [Risperidone  And Related] Shortness Of Breath   Amoxicillin -Pot Clavulanate Rash    Gets yeast infection every time   Aripiprazole Unknown    REACTION: muscle jerks   Fluoxetine Hcl Unknown    REACTION: Intolerant   Geodon   [Kdc:Red Dye+Yellow Dye+Ziprasidone ] Other    shakes   Montelukast  Other    it kept me sick with an upper respiratory infection   Nsaids Other    Upper GI bleed   Paroxetine Unknown    REACTION: Intolerance   Varenicline  Other    messing with my mood   Ziprasidone  Hcl Other     shakes

## 2025-01-04 ENCOUNTER — Telehealth: Payer: Self-pay | Admitting: Licensed Clinical Social Worker

## 2025-01-04 ENCOUNTER — Telehealth: Payer: Self-pay

## 2025-01-04 ENCOUNTER — Encounter: Payer: Self-pay | Admitting: Licensed Clinical Social Worker

## 2025-01-04 NOTE — Patient Outreach (Signed)
 LCSW called patient for scheduled appointment. Patients friend Marval answered the phone. Marval stated that patient is in the ICU at [facility] hospital. LCSW confirmed that patient is in the hospital.   Cena Ligas, LCSW Clinical Social Worker [facility] Population [facility]

## 2025-01-12 ENCOUNTER — Telehealth: Payer: Self-pay | Admitting: Family Medicine

## 2025-01-12 NOTE — Telephone Encounter (Signed)
 Copied from CRM #[id number]. Topic: General - Other >> Jan 12, 2025  3:33 PM Fonda T wrote: Reason for CRM: Relative, Elijah, calling as per pt requested, to inform provider pt is at [facility] rehab center, after being at [facility] about 2 weeks, due overdose of medication.   Caller number is [phone], if need to speak further regarding above.

## 2025-01-18 ENCOUNTER — Telehealth: Payer: Self-pay | Admitting: Licensed Clinical Social Worker

## 2025-01-18 ENCOUNTER — Telehealth: Payer: Self-pay

## 2025-01-20 ENCOUNTER — Telehealth: Payer: Self-pay

## 2025-01-20 NOTE — Telephone Encounter (Signed)
 Is it ok for verbal orders for PT and nursing evaluation?

## 2025-01-20 NOTE — Telephone Encounter (Signed)
 Home health advised.  ?

## 2025-01-20 NOTE — Transitions of Care (Post Inpatient/ED Visit) (Signed)
" ° °  01/20/2025  Name: Krystal Burgess MRN: [id number] DOB: [birth year]  Today's TOC FU Call Status: Today's TOC FU Call Status:: Unsuccessful Call (1st Attempt) Unsuccessful Call (1st Attempt) Date: 01/20/25  Attempted to reach the patient regarding the most recent Inpatient/ED visit.  Follow Up Plan: Additional outreach attempts will be made to reach the patient to complete the Transitions of Care (Post Inpatient/ED visit) call.   Shona Prow RN, CCM [place]  [facility] RN Care Manager [phone]  "

## 2025-01-22 ENCOUNTER — Other Ambulatory Visit: Payer: Self-pay | Admitting: Family Medicine

## 2025-01-23 ENCOUNTER — Encounter: Payer: Self-pay | Admitting: Family Medicine

## 2025-01-23 ENCOUNTER — Telehealth: Admitting: Family Medicine

## 2025-01-23 ENCOUNTER — Telehealth: Payer: Self-pay

## 2025-01-23 ENCOUNTER — Inpatient Hospital Stay: Admitting: Family Medicine

## 2025-01-23 ENCOUNTER — Other Ambulatory Visit: Payer: Self-pay | Admitting: Family Medicine

## 2025-01-23 VITALS — BP 105/65 | HR 89 | Temp 96.2°F

## 2025-01-23 DIAGNOSIS — I10 Essential (primary) hypertension: Secondary | ICD-10-CM

## 2025-01-23 DIAGNOSIS — N179 Acute kidney failure, unspecified: Secondary | ICD-10-CM

## 2025-01-23 DIAGNOSIS — L03114 Cellulitis of left upper limb: Secondary | ICD-10-CM | POA: Diagnosis not present

## 2025-01-23 DIAGNOSIS — J9611 Chronic respiratory failure with hypoxia: Secondary | ICD-10-CM

## 2025-01-23 DIAGNOSIS — F3132 Bipolar disorder, current episode depressed, moderate: Secondary | ICD-10-CM

## 2025-01-23 MED ORDER — NALOXONE HCL 4 MG/0.1ML NA LIQD: 1.0000 | Freq: Once | NASAL | 1 refills | Status: AC

## 2025-01-23 MED ORDER — DOXYCYCLINE HYCLATE 100 MG PO TABS: 100.0000 mg | ORAL_TABLET | Freq: Two times a day (BID) | ORAL | 0 refills | Status: AC

## 2025-01-23 NOTE — Progress Notes (Signed)
 Pt reports that she was put on Xanax  1 mg BID and Latuda 20 mg daily.   She was told to take Amlodipine  10 mg PRN and told to d/c Losartan  50 mg. Gabapentin  was cut to 100 mg but she went back up to 600 mg because of the pain in her legs, Theo 300 mg every day but she is taking BID.   Her L arm is swollen and painful.   PT came out Friday and marked her arm for her and advised her to go to the ED if the

## 2025-01-23 NOTE — Assessment & Plan Note (Signed)
 Doing well off of losartan .
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
## 2025-01-24 ENCOUNTER — Telehealth: Payer: Self-pay | Admitting: Family Medicine

## 2025-01-24 ENCOUNTER — Encounter: Payer: Self-pay | Admitting: Family Medicine

## 2025-01-24 ENCOUNTER — Other Ambulatory Visit: Payer: Self-pay | Admitting: Family Medicine

## 2025-01-24 DIAGNOSIS — E1122 Type 2 diabetes mellitus with diabetic chronic kidney disease: Secondary | ICD-10-CM

## 2025-01-24 MED ORDER — BLOOD GLUCOSE TEST VI STRP: 1.0000 | ORAL_STRIP | 0 refills | Status: AC

## 2025-01-24 MED ORDER — LANCETS MISC: 1.0000 | 0 refills | Status: AC

## 2025-01-24 MED ORDER — LANCET DEVICE MISC: 1.0000 | 0 refills | Status: AC

## 2025-01-24 MED ORDER — RABEPRAZOLE SODIUM 20 MG PO TBEC: 20.0000 mg | DELAYED_RELEASE_TABLET | Freq: Every day | ORAL | 1 refills | Status: AC

## 2025-01-24 MED ORDER — BLOOD GLUCOSE MONITORING SUPPL DEVI: 1.0000 | 0 refills | Status: AC

## 2025-01-24 NOTE — Telephone Encounter (Signed)
 [facility] called in requesting verbal orders for Wound care and Patient teaching.

## 2025-01-24 NOTE — Telephone Encounter (Signed)
 Ok for verbal order. What wound does she have?

## 2025-01-24 NOTE — Telephone Encounter (Signed)
"   01/24/25  2:43 PM Note Spoke with [facility] - gave me the nurse caring for patient phone # [phone]-Torrie- as she could not see any wounds on the pateint 's chart at the time of the call but she had just been seen by the  home health nurse and she would know more. She has asked that torrie call us  back as at the time of the call she was placing a catheter.      "

## 2025-01-24 NOTE — Telephone Encounter (Signed)
 Active Medications[1] Meds ordered this encounter  Medications   Blood Glucose Monitoring Suppl DEVI    Sig: 1 each by Does not apply route as directed. Dispense based on patient and insurance preference. Use up to once daily as directed. (FOR ICD-10 E10.9, E11.9).    Dispense:  1 each    Refill:  0   Glucose Blood (BLOOD GLUCOSE TEST STRIPS) STRP    Sig: 1 each by Does not apply route as directed. Dispense based on patient and insurance preference. Use up to 1 x  daily as directed. (FOR ICD-10 E10.9, E11.9).    Dispense:  100 strip    Refill:  0   Lancet Device MISC    Sig: 1 each by Does not apply route as directed. Dispense based on patient and insurance preference. Use up to once daily as directed. (FOR ICD-10 E10.9, E11.9).    Dispense:  1 each    Refill:  0   Lancets MISC    Sig: 1 each by Does not apply route as directed. Dispense based on patient and insurance preference. Use up to once daily as directed. (FOR ICD-10 E10.9, E11.9).    Dispense:  100 each    Refill:  0       [1]  Current Meds  Medication Sig   Blood Glucose Monitoring Suppl DEVI 1 each by Does not apply route as directed. Dispense based on patient and insurance preference. Use up to once daily as directed. (FOR ICD-10 E10.9, E11.9).   Glucose Blood (BLOOD GLUCOSE TEST STRIPS) STRP 1 each by Does not apply route as directed. Dispense based on patient and insurance preference. Use up to 1 x  daily as directed. (FOR ICD-10 E10.9, E11.9).   Lancet Device MISC 1 each by Does not apply route as directed. Dispense based on patient and insurance preference. Use up to once daily as directed. (FOR ICD-10 E10.9, E11.9).   Lancets MISC 1 each by Does not apply route as directed. Dispense based on patient and insurance preference. Use up to once daily as directed. (FOR ICD-10 E10.9, E11.9).

## 2025-01-24 NOTE — Telephone Encounter (Signed)
 Spoke with [facility] - gave me the nurse caring for patient phone # [phone]-Torrie- as she could not see any wounds on the pateint 's chart at the time of the call but she had just been seen by the  home health nurse and she would know more. She has asked that torrie call us  back as at the time of the call she was placing a catheter.

## 2025-01-25 NOTE — Telephone Encounter (Signed)
 Spoke with [phone] with [facility] requesting verbal orders to see patient for skilled nursing  one week x 9 weeks   Also regarding request for wound care and patient instruction orders  She states that patient has a would on Left heel that appears to her to be deep tissue injury skin is intact and can not be measured for depth but does state the area encompasses about 1/2 the heel . She states it was being treated in hospital - she has been using foam dressing just until can get the wound care orders -  She also has noticed a small spot on Left big toe that looks like friction area but this is very small.

## 2025-01-25 NOTE — Telephone Encounter (Signed)
 Spoke with Raguel - development worker, international [employer] of [facility]  - lab orders were faxed to [phone] for home draw

## 2025-01-25 NOTE — Telephone Encounter (Signed)
 Tori with [facility] informed of verbal approvals- she will have wound care go out and evaluate the patient and will sent this information for Dr. Alvan in regards to treatment approval for the future.

## 2025-01-25 NOTE — Telephone Encounter (Signed)
 Yes please fax order placed on 2/2 to [facility]

## 2025-01-25 NOTE — Telephone Encounter (Signed)
 OK for VO for SN and wound

## 2025-01-26 ENCOUNTER — Encounter: Payer: Self-pay | Admitting: Family Medicine

## 2025-01-27 ENCOUNTER — Telehealth: Payer: Self-pay

## 2025-01-27 NOTE — Telephone Encounter (Signed)
" °  Copied from CRM [id number]. Topic: Clinical - Medication Question >> Jan 27, 2025  1:33 PM Krystal Burgess wrote: Reason for CRM: The hospital put her on 10 mg of steroids and wants to taper off, she is also asking if she can cut the 2mg  cyclobenzaprine in half for the neck pain from having to hold arm a certain way from the IV.  Not able to eat due to the steroids. Using heating pad for the neck pain. Please call patient [phone]     "

## 2025-01-27 NOTE — Telephone Encounter (Signed)
 Copied from CRM [id number]. Topic: Clinical - Medication Question >> Jan 27, 2025  1:39 PM Delon T wrote: Reason for CRM: [facility] needs an order to be able to give shingles shot

## 2025-01-27 NOTE — Telephone Encounter (Signed)
 What steroids? What dose is she taking? What strength does she have at home?  I can definitely taper her.SABRA

## 2025-01-27 NOTE — Telephone Encounter (Signed)
" °  Spoke w/pt and she stated that the hospital gave her 10 mg and she has over 30 tablets. It is also causing a lot of belching she is unsure if she should also be on another medication for her stomach. She has taken 2 Zofran  today. (Pt is doing a lot of heavy belching while on the phone). She hasn't been able to eat and has not been able to sleep. She doesn't want to take any sleeping medication she stated that she threw them away.  She c/o neck pain and wanted to know about taking flexeril 2 mg she stated that it is not scored and wanted to know if it can cut in half she said that she is ok with getting into PT however she doesn't want to do an inpatient she would like for someone to come in to do PT with her.   *there is also a note about her getting her 2nd Shingrix  vaccine. She asked about this before hanging up.   Pt ok with getting advice about how to proceed via Mychart.  "

## 2025-02-15 ENCOUNTER — Telehealth: Admitting: Licensed Clinical Social Worker

## 2025-09-05 ENCOUNTER — Ambulatory Visit
